# Patient Record
Sex: Female | Born: 1940
Health system: Southern US, Community
[De-identification: ages and names within clinical notes are randomized; demographics above are authoritative.]

## PROBLEM LIST (undated history)

## (undated) DIAGNOSIS — F419 Anxiety disorder, unspecified: Secondary | ICD-10-CM

## (undated) DIAGNOSIS — T7840XA Allergy, unspecified, initial encounter: Secondary | ICD-10-CM

## (undated) DIAGNOSIS — F32A Depression, unspecified: Secondary | ICD-10-CM

## (undated) DIAGNOSIS — M797 Fibromyalgia: Secondary | ICD-10-CM

## (undated) DIAGNOSIS — M179 Osteoarthritis of knee, unspecified: Secondary | ICD-10-CM

## (undated) DIAGNOSIS — M8580 Other specified disorders of bone density and structure, unspecified site: Secondary | ICD-10-CM

## (undated) DIAGNOSIS — J45909 Unspecified asthma, uncomplicated: Secondary | ICD-10-CM

## (undated) DIAGNOSIS — E669 Obesity, unspecified: Secondary | ICD-10-CM

## (undated) DIAGNOSIS — F329 Major depressive disorder, single episode, unspecified: Secondary | ICD-10-CM

## (undated) DIAGNOSIS — M1710 Unilateral primary osteoarthritis, unspecified knee: Secondary | ICD-10-CM

## (undated) HISTORY — PX: TONSILLECTOMY: SUR1361

## (undated) HISTORY — PX: CHOLECYSTECTOMY: SHX55

## (undated) HISTORY — DX: Allergy, unspecified, initial encounter: T78.40XA

## (undated) HISTORY — PX: EYE SURGERY: SHX253

## (undated) HISTORY — DX: Unspecified asthma, uncomplicated: J45.909

## (undated) HISTORY — DX: Osteoarthritis of knee, unspecified: M17.9

## (undated) HISTORY — DX: Other specified disorders of bone density and structure, unspecified site: M85.80

## (undated) HISTORY — PX: PAROTIDECTOMY: SUR1003

## (undated) HISTORY — DX: Unilateral primary osteoarthritis, unspecified knee: M17.10

## (undated) HISTORY — DX: Anxiety disorder, unspecified: F41.9

## (undated) HISTORY — DX: Fibromyalgia: M79.7

## (undated) HISTORY — PX: ABDOMINAL HYSTERECTOMY: SHX81

## (undated) HISTORY — DX: Depression, unspecified: F32.A

## (undated) HISTORY — DX: Major depressive disorder, single episode, unspecified: F32.9

## (undated) HISTORY — DX: Obesity, unspecified: E66.9

## (undated) HISTORY — PX: APPENDECTOMY: SHX54

---

## 2008-03-12 ENCOUNTER — Other Ambulatory Visit: Payer: Self-pay

## 2008-03-12 ENCOUNTER — Ambulatory Visit: Payer: Self-pay | Admitting: Specialist

## 2008-03-21 ENCOUNTER — Inpatient Hospital Stay: Payer: Self-pay | Admitting: Specialist

## 2010-02-10 ENCOUNTER — Ambulatory Visit: Payer: Self-pay | Admitting: Family Medicine

## 2011-01-30 ENCOUNTER — Ambulatory Visit: Payer: Self-pay | Admitting: Family Medicine

## 2011-03-03 ENCOUNTER — Ambulatory Visit: Payer: Self-pay | Admitting: Family Medicine

## 2011-09-16 ENCOUNTER — Ambulatory Visit: Payer: Self-pay

## 2012-04-18 ENCOUNTER — Ambulatory Visit: Payer: Self-pay | Admitting: Unknown Physician Specialty

## 2012-04-20 LAB — PATHOLOGY REPORT

## 2012-08-31 HISTORY — PX: JOINT REPLACEMENT: SHX530

## 2013-03-01 ENCOUNTER — Ambulatory Visit: Payer: Self-pay | Admitting: Family Medicine

## 2013-03-07 ENCOUNTER — Ambulatory Visit: Payer: Self-pay | Admitting: Family Medicine

## 2013-03-08 ENCOUNTER — Encounter: Payer: Self-pay | Admitting: *Deleted

## 2013-03-23 ENCOUNTER — Ambulatory Visit: Payer: Self-pay | Admitting: General Surgery

## 2013-04-18 ENCOUNTER — Ambulatory Visit: Payer: Self-pay | Admitting: Surgery

## 2013-04-18 HISTORY — PX: BREAST CYST ASPIRATION: SHX578

## 2013-09-26 ENCOUNTER — Ambulatory Visit: Payer: Self-pay

## 2014-03-26 ENCOUNTER — Ambulatory Visit: Payer: Self-pay | Admitting: Family Medicine

## 2014-04-18 ENCOUNTER — Emergency Department: Payer: Self-pay | Admitting: Emergency Medicine

## 2015-03-06 DIAGNOSIS — M179 Osteoarthritis of knee, unspecified: Secondary | ICD-10-CM

## 2015-03-06 DIAGNOSIS — E1159 Type 2 diabetes mellitus with other circulatory complications: Secondary | ICD-10-CM | POA: Insufficient documentation

## 2015-03-06 DIAGNOSIS — F419 Anxiety disorder, unspecified: Secondary | ICD-10-CM

## 2015-03-06 DIAGNOSIS — M159 Polyosteoarthritis, unspecified: Secondary | ICD-10-CM | POA: Insufficient documentation

## 2015-03-06 DIAGNOSIS — L304 Erythema intertrigo: Secondary | ICD-10-CM | POA: Insufficient documentation

## 2015-03-06 DIAGNOSIS — E119 Type 2 diabetes mellitus without complications: Secondary | ICD-10-CM | POA: Insufficient documentation

## 2015-03-06 DIAGNOSIS — J302 Other seasonal allergic rhinitis: Secondary | ICD-10-CM | POA: Insufficient documentation

## 2015-03-06 DIAGNOSIS — M1990 Unspecified osteoarthritis, unspecified site: Secondary | ICD-10-CM

## 2015-03-06 DIAGNOSIS — E669 Obesity, unspecified: Secondary | ICD-10-CM | POA: Insufficient documentation

## 2015-03-06 DIAGNOSIS — F329 Major depressive disorder, single episode, unspecified: Secondary | ICD-10-CM

## 2015-03-06 DIAGNOSIS — M8580 Other specified disorders of bone density and structure, unspecified site: Secondary | ICD-10-CM | POA: Insufficient documentation

## 2015-03-06 DIAGNOSIS — M150 Primary generalized (osteo)arthritis: Secondary | ICD-10-CM

## 2015-03-06 DIAGNOSIS — I10 Essential (primary) hypertension: Secondary | ICD-10-CM

## 2015-03-06 DIAGNOSIS — M1710 Unilateral primary osteoarthritis, unspecified knee: Secondary | ICD-10-CM

## 2015-03-06 DIAGNOSIS — F32A Depression, unspecified: Secondary | ICD-10-CM | POA: Insufficient documentation

## 2015-03-06 DIAGNOSIS — I152 Hypertension secondary to endocrine disorders: Secondary | ICD-10-CM | POA: Insufficient documentation

## 2015-03-06 HISTORY — DX: Anxiety disorder, unspecified: F41.9

## 2015-03-11 ENCOUNTER — Encounter: Payer: Self-pay | Admitting: Family Medicine

## 2015-03-11 ENCOUNTER — Ambulatory Visit (INDEPENDENT_AMBULATORY_CARE_PROVIDER_SITE_OTHER): Payer: PPO | Admitting: Family Medicine

## 2015-03-11 VITALS — BP 129/75 | HR 65 | Temp 98.2°F | Ht 59.5 in | Wt 228.0 lb

## 2015-03-11 DIAGNOSIS — Z0000 Encounter for general adult medical examination without abnormal findings: Secondary | ICD-10-CM

## 2015-03-11 DIAGNOSIS — E118 Type 2 diabetes mellitus with unspecified complications: Secondary | ICD-10-CM | POA: Diagnosis not present

## 2015-03-11 DIAGNOSIS — M159 Polyosteoarthritis, unspecified: Secondary | ICD-10-CM

## 2015-03-11 DIAGNOSIS — M150 Primary generalized (osteo)arthritis: Secondary | ICD-10-CM

## 2015-03-11 DIAGNOSIS — I10 Essential (primary) hypertension: Secondary | ICD-10-CM | POA: Diagnosis not present

## 2015-03-11 LAB — URINALYSIS, ROUTINE W REFLEX MICROSCOPIC
BILIRUBIN UA: NEGATIVE
Glucose, UA: NEGATIVE
Ketones, UA: NEGATIVE
Leukocytes, UA: NEGATIVE
NITRITE UA: NEGATIVE
PROTEIN UA: NEGATIVE
RBC, UA: NEGATIVE
SPEC GRAV UA: 1.015 (ref 1.005–1.030)
Urobilinogen, Ur: 0.2 mg/dL (ref 0.2–1.0)
pH, UA: 5.5 (ref 5.0–7.5)

## 2015-03-11 MED ORDER — CARVEDILOL 12.5 MG PO TABS: 12.5000 mg | ORAL_TABLET | Freq: Two times a day (BID) | ORAL | Status: DC

## 2015-03-11 MED ORDER — LOSARTAN POTASSIUM 100 MG PO TABS: 100.0000 mg | ORAL_TABLET | Freq: Every day | ORAL | Status: DC

## 2015-03-11 MED ORDER — TRAMADOL HCL 50 MG PO TABS: 50.0000 mg | ORAL_TABLET | Freq: Every day | ORAL | Status: DC

## 2015-03-11 MED ORDER — OMEPRAZOLE 20 MG PO CPDR: 20.0000 mg | DELAYED_RELEASE_CAPSULE | Freq: Every day | ORAL | Status: DC

## 2015-03-11 MED ORDER — DULOXETINE HCL 60 MG PO CPEP: 60.0000 mg | ORAL_CAPSULE | Freq: Every day | ORAL | Status: DC

## 2015-03-11 NOTE — Assessment & Plan Note (Signed)
Cant take NSAIDS as gets GI bleeding Will give tramadol 1/2 to 1 a day

## 2015-03-11 NOTE — Assessment & Plan Note (Signed)
The current medical regimen is effective;  continue present plan and medications.  

## 2015-03-11 NOTE — Assessment & Plan Note (Signed)
Diet controled 

## 2015-03-11 NOTE — Progress Notes (Signed)
BP 129/75 mmHg  Pulse 65  Temp(Src) 98.2 F (36.8 C)  Ht 4' 11.5" (1.511 m)  Wt 228 lb (103.42 kg)  BMI 45.30 kg/m2  SpO2 99%   Subjective:    Patient ID: Cynthia Jones, female    DOB: [birth year], 74 y.o.   MRN: [id number]  HPI: Cynthia Jones is a 74 y.o. female Pt annual wellness depression screen and other such as fall risk done and normal  Computer issues still not set up (we have tried to get [facility] to set up) Chief Complaint  Patient presents with  . Annual Exam  pts arthritis having a lot of pain in back knees hips shoulders wrists and fingers  Was taking percocet 1/2 tab prn and helped Discussed risk of narcotics and not using. BP stable taking meds with no problems   Relevant past medical, surgical, family and social history reviewed and updated as indicated. Interim medical history since our last visit reviewed. Allergies and medications reviewed and updated.  Review of Systems  Constitutional: Negative.   HENT: Negative.   Eyes: Negative.   Respiratory: Negative.   Cardiovascular: Negative.   Gastrointestinal: Negative.   Endocrine: Negative.   Genitourinary: Negative.   Musculoskeletal: Positive for myalgias, back pain, joint swelling, arthralgias, gait problem, neck pain and neck stiffness.  Skin: Negative.   Allergic/Immunologic: Negative.   Neurological: Negative.   Hematological: Negative.   Psychiatric/Behavioral: Negative.     Per HPI unless specifically indicated above     Objective:    BP 129/75 mmHg  Pulse 65  Temp(Src) 98.2 F (36.8 C)  Ht 4' 11.5" (1.511 m)  Wt 228 lb (103.42 kg)  BMI 45.30 kg/m2  SpO2 99%  Wt Readings from Last 3 Encounters:  03/11/15 228 lb (103.42 kg)  01/29/15 231 lb (104.781 kg)    Physical Exam  Constitutional: She is oriented to person, place, and time. She appears well-developed and well-nourished.  HENT:  Head: Normocephalic and atraumatic.  Right Ear: External ear normal.  Left Ear:  External ear normal.  Nose: Nose normal.  Mouth/Throat: Oropharynx is clear and moist.  Eyes: Conjunctivae and EOM are normal. Pupils are equal, round, and reactive to light.  Neck: Normal range of motion. Neck supple. Carotid bruit is not present.  Cardiovascular: Normal rate, regular rhythm and normal heart sounds.   No murmur heard. Pulmonary/Chest: Effort normal and breath sounds normal. Right breast exhibits no mass and no tenderness. Left breast exhibits no mass and no tenderness. Breasts are symmetrical.  Abdominal: Soft. Bowel sounds are normal. There is no hepatosplenomegaly.  Musculoskeletal: Normal range of motion.  Neurological: She is alert and oriented to person, place, and time.  Skin: No rash noted.  Psychiatric: She has a normal mood and affect. Her behavior is normal. Judgment and thought content normal.        Assessment & Plan:   Problem List Items Addressed This Visit      Cardiovascular and Mediastinum   Hypertension - Primary    The current medical regimen is effective;  continue present plan and medications.         Endocrine   Diabetes mellitus    Diet controled        Musculoskeletal and Integument   Osteoarthritis    Cant take NSAIDS as gets GI bleeding Will give tramadol 1/2 to 1 a day       Other Visit Diagnoses    PE (physical exam), annual  Follow up plan: Return in about 3 months (around 06/11/2015) for 3 mo a1c.

## 2015-03-12 LAB — COMPREHENSIVE METABOLIC PANEL
ALT: 18 IU/L (ref 0–32)
AST: 18 IU/L (ref 0–40)
Albumin/Globulin Ratio: 1.4 (ref 1.1–2.5)
Albumin: 4.2 g/dL (ref 3.5–4.8)
Alkaline Phosphatase: 50 IU/L (ref 39–117)
BUN/Creatinine Ratio: 14 (ref 11–26)
BUN: 11 mg/dL (ref 8–27)
Bilirubin Total: 0.4 mg/dL (ref 0.0–1.2)
CO2: 23 mmol/L (ref 18–29)
Calcium: 9.2 mg/dL (ref 8.7–10.3)
Chloride: 102 mmol/L (ref 97–108)
Creatinine, Ser: 0.78 mg/dL (ref 0.57–1.00)
GFR calc Af Amer: 88 mL/min/{1.73_m2} (ref >59)
GFR calc non Af Amer: 76 mL/min/{1.73_m2} (ref >59)
Globulin, Total: 2.9 g/dL (ref 1.5–4.5)
Glucose: 127 mg/dL — ABNORMAL HIGH (ref 65–99)
Potassium: 4.3 mmol/L (ref 3.5–5.2)
Sodium: 142 mmol/L (ref 134–144)
Total Protein: 7.1 g/dL (ref 6.0–8.5)

## 2015-03-12 LAB — CBC WITH DIFFERENTIAL/PLATELET
Basophils Absolute: 0.1 10*3/uL (ref 0.0–0.2)
Basos: 1 %
EOS (ABSOLUTE): 0.1 10*3/uL (ref 0.0–0.4)
EOS: 1 %
HEMOGLOBIN: 13.9 g/dL (ref 11.1–15.9)
Hematocrit: 40.4 % (ref 34.0–46.6)
IMMATURE GRANULOCYTES: 0 %
Immature Grans (Abs): 0 10*3/uL (ref 0.0–0.1)
LYMPHS: 22 %
Lymphocytes Absolute: 1.8 10*3/uL (ref 0.7–3.1)
MCH: 31.2 pg (ref 26.6–33.0)
MCHC: 34.4 g/dL (ref 31.5–35.7)
MCV: 91 fL (ref 79–97)
Monocytes Absolute: 0.8 10*3/uL (ref 0.1–0.9)
Monocytes: 10 %
Neutrophils Absolute: 5.3 10*3/uL (ref 1.4–7.0)
Neutrophils: 66 %
PLATELETS: 340 10*3/uL (ref 150–379)
RBC: 4.46 x10E6/uL (ref 3.77–5.28)
RDW: 13.3 % (ref 12.3–15.4)
WBC: 8 10*3/uL (ref 3.4–10.8)

## 2015-03-12 LAB — LIPID PANEL
Chol/HDL Ratio: 3.1 ratio units (ref 0.0–4.4)
Cholesterol, Total: 213 mg/dL — ABNORMAL HIGH (ref 100–199)
HDL: 69 mg/dL (ref >39)
LDL CALC: 121 mg/dL — AB (ref 0–99)
TRIGLYCERIDES: 117 mg/dL (ref 0–149)
VLDL CHOLESTEROL CAL: 23 mg/dL (ref 5–40)

## 2015-03-12 LAB — HEMOGLOBIN A1C
ESTIMATED AVERAGE GLUCOSE: 143 mg/dL
HEMOGLOBIN A1C: 6.6 % — AB (ref 4.8–5.6)

## 2015-03-12 LAB — TSH: TSH: 4.23 u[IU]/mL (ref 0.450–4.500)

## 2015-03-12 MED ORDER — CARVEDILOL 12.5 MG PO TABS: 12.5000 mg | ORAL_TABLET | Freq: Two times a day (BID) | ORAL | Status: DC

## 2015-03-12 NOTE — Addendum Note (Signed)
Addended byVonita Moss on: 03/12/2015 05:18 PM   Modules accepted: Orders, SmartSet

## 2015-03-18 ENCOUNTER — Telehealth: Payer: Self-pay | Admitting: Family Medicine

## 2015-03-18 NOTE — Telephone Encounter (Signed)
Pt called stated that the medication that was changed last Monday and it's just not working. Pt stated she thinks dosage may be to low. Please call pt ASAP. Thanks.

## 2015-03-18 NOTE — Telephone Encounter (Signed)
Phone call Discussed with patient tramadol not helping pain patient wants something more. Patient is [facility] orthopedics and rheumatology and mostly has multiple and diffuse degenerative joint disease with chronic pain. Concerned about using stronger medication and risk. Offered to refer patient back to specialists and she refuses offered to refer patient to pain clinic and she refuses. Discussed use of topicals which she refuses. Discussed use of over-the-counter medicines hot and cold packs.

## 2015-03-19 ENCOUNTER — Telehealth: Payer: Self-pay

## 2015-03-19 DIAGNOSIS — M150 Primary generalized (osteo)arthritis: Principal | ICD-10-CM

## 2015-03-19 DIAGNOSIS — M159 Polyosteoarthritis, unspecified: Secondary | ICD-10-CM

## 2015-03-19 NOTE — Telephone Encounter (Signed)
Patient states that she would like to proceed with your recommendation to a pain clinic. Please advise where patient needs to go and next steps.

## 2015-03-21 ENCOUNTER — Telehealth: Payer: Self-pay | Admitting: Family Medicine

## 2015-03-21 ENCOUNTER — Other Ambulatory Visit: Payer: Self-pay | Admitting: Family Medicine

## 2015-03-21 DIAGNOSIS — M159 Polyosteoarthritis, unspecified: Secondary | ICD-10-CM

## 2015-03-21 DIAGNOSIS — M150 Primary generalized (osteo)arthritis: Principal | ICD-10-CM

## 2015-03-21 MED ORDER — TRAMADOL HCL 50 MG PO TABS: 50.0000 mg | ORAL_TABLET | Freq: Two times a day (BID) | ORAL | Status: DC

## 2015-03-21 NOTE — Telephone Encounter (Signed)
Pt called stated she needs a call back from [facility]. Please call her ASAP. Thanks.

## 2015-03-21 NOTE — Telephone Encounter (Signed)
Patient for pain management will change tramadol to 50 mg twice a day and referred to pain clinic patient now willing to go to local pain management clinic.

## 2015-04-02 ENCOUNTER — Telehealth: Payer: Self-pay

## 2015-04-02 NOTE — Telephone Encounter (Signed)
Patient wanted [facility] to know she had bright red blood in her stool, so she has stopped taking the Tramadol

## 2015-04-02 NOTE — Telephone Encounter (Signed)
Pt called would like a call back from [place]. Pt stated she would be home all day so anytime you call will be fine. Thanks.

## 2015-04-06 NOTE — Telephone Encounter (Signed)
call

## 2015-04-08 NOTE — Telephone Encounter (Signed)
Phone call Discussed with patient Had bright red blood on further discussion like hemorrhoids will observe

## 2015-04-09 ENCOUNTER — Encounter: Payer: Self-pay | Admitting: Pain Medicine

## 2015-04-09 ENCOUNTER — Ambulatory Visit: Payer: PPO | Attending: Pain Medicine | Admitting: Pain Medicine

## 2015-04-09 VITALS — BP 127/54 | HR 84 | Temp 97.9°F | Resp 18 | Ht 60.0 in | Wt 220.0 lb

## 2015-04-09 DIAGNOSIS — E119 Type 2 diabetes mellitus without complications: Secondary | ICD-10-CM | POA: Insufficient documentation

## 2015-04-09 DIAGNOSIS — I10 Essential (primary) hypertension: Secondary | ICD-10-CM | POA: Diagnosis not present

## 2015-04-09 DIAGNOSIS — M533 Sacrococcygeal disorders, not elsewhere classified: Secondary | ICD-10-CM | POA: Insufficient documentation

## 2015-04-09 DIAGNOSIS — M5136 Other intervertebral disc degeneration, lumbar region: Secondary | ICD-10-CM | POA: Diagnosis not present

## 2015-04-09 DIAGNOSIS — M542 Cervicalgia: Secondary | ICD-10-CM | POA: Diagnosis present

## 2015-04-09 DIAGNOSIS — M7060 Trochanteric bursitis, unspecified hip: Secondary | ICD-10-CM | POA: Insufficient documentation

## 2015-04-09 DIAGNOSIS — M069 Rheumatoid arthritis, unspecified: Secondary | ICD-10-CM | POA: Insufficient documentation

## 2015-04-09 DIAGNOSIS — M546 Pain in thoracic spine: Secondary | ICD-10-CM | POA: Diagnosis present

## 2015-04-09 DIAGNOSIS — M47816 Spondylosis without myelopathy or radiculopathy, lumbar region: Secondary | ICD-10-CM | POA: Insufficient documentation

## 2015-04-09 DIAGNOSIS — M797 Fibromyalgia: Secondary | ICD-10-CM | POA: Insufficient documentation

## 2015-04-09 DIAGNOSIS — Z96651 Presence of right artificial knee joint: Secondary | ICD-10-CM | POA: Diagnosis not present

## 2015-04-09 DIAGNOSIS — E1340 Other specified diabetes mellitus with diabetic neuropathy, unspecified: Secondary | ICD-10-CM | POA: Insufficient documentation

## 2015-04-09 DIAGNOSIS — M1990 Unspecified osteoarthritis, unspecified site: Secondary | ICD-10-CM | POA: Diagnosis not present

## 2015-04-09 DIAGNOSIS — K219 Gastro-esophageal reflux disease without esophagitis: Secondary | ICD-10-CM | POA: Insufficient documentation

## 2015-04-09 DIAGNOSIS — M5481 Occipital neuralgia: Secondary | ICD-10-CM | POA: Insufficient documentation

## 2015-04-09 DIAGNOSIS — M170 Bilateral primary osteoarthritis of knee: Secondary | ICD-10-CM

## 2015-04-09 DIAGNOSIS — M1712 Unilateral primary osteoarthritis, left knee: Secondary | ICD-10-CM | POA: Insufficient documentation

## 2015-04-09 NOTE — Progress Notes (Signed)
Subjective:    Patient ID: Cynthia Jones, female    DOB: 1941/06/25, 74 y.o.   MRN: [id number]  HPI   Patient is 74 year old female who comes [facility] request of Dr. Vonita Moss for further evaluation and treatment of pain involving the neck and entire back upper and lower extremity regions. On today's visit patient stated that she has significant pain involving the knees with prior total knee replacement on the right with severe pain of the left knee at this time. Patient also admits to significant lower back pain as well as pain occurring in the cervical thoracic region patient described her pain as aching agonizing pain that increased with walking. Patient stated that the pain interfered with all activities of daily living including ability to obtain restful sleep. Patient stated the pain had decreased with resting and medications. We discussed the patient's condition and will proceed with geniculate nerve blocks of the knee at time of return appointment. The patient was with understanding and in agreement with suggested treatment plan     Review of Systems   Cardiovascular High blood pressure  Pulmonary Unremarkable  Neurological Unremarkable  Psychological Unremarkable  Gastrointestinal Gastroesophageal reflux disease  Genitourinary Unremarkable  Hematological Unremarkable  Endocrine Diabetes mellitus (patient states that she is diet controlled)  Rheumatological Osteoarthritis Rheumatoid arthritis Fibromyalgia  Musculoskeletal Unremarkable  Other significant Weight loss           Objective:   Physical Exam  There was tends to palpation of the splenius capitis and occipitalis musculature region a moderate degree. Palpation of the trapezius muscle was with moderate tends to palpation as well. There appeared to be unremarkable Spurling's maneuver and range of motion maneuvers of the cervical spine will for were without exacerbation of  severe pain. There was tenderness of the acromioclavicular and glenohumeral joint regions of moderate degree. Tinel and Phalen's maneuver was associated with moderate discomfort. Patient appeared to be with decreased grip strength. There was tenderness over the thoracic facet as well as cervical facet and cervical and thoracic paraspinal musculature regions of moderate degree. Palpation over the lumbar paraspinal musculature region lumbar facet region was with moderate to moderately severe tenderness to palpation on the left compared to the right with severe tends to palpation of the gluteal and piriformis musculature region on the left compared to the right. There was severe tends to palpation of the PSIS and PII S regions as well with left greater than right median more severe. There was tenderness along the greater trochanteric region and iliotibial band region. Straight leg raising was tolerated to approximately 30 without increased pain with dorsiflexion noted. EHL strength appeared to be decreased. There was well-healed surgical scar of the knee without increased warmth or erythema in the region of the knee. There was crepitus of the left knee with negative anterior and posterior drawer signs without ballottement of the patella and without increased warmth or erythema noted. EHL strength appeared to be decreased. There was negative clonus and negative Homans. There was moderate tenderness to palpation of the greater trochanteric region and iliotibial band region. The abdomen was nontender and no costovertebral angle tenderness was noted.      Assessment & Plan:   Degenerative disc disease lumbar spine  Lumbar facet syndrome  Fibromyalgia  Osteoarthritis  Rheumatoid arthritis    Plan  Continue present medications at this time  Geniculate nerve blocks of the knee to be performed at time of return appointment  F/U PCP [name]  for evaliation of  BP, diabetes mellitus, and general  medical  condition  F/U surgical evaluation to be considered as discussed  F/U neurological evaluation with PNCV/EMG studies to be considered  Rheumatological evaluation pending response to treatment and pending discussion with [name]  May consider radiofrequency rhizolysis or intraspinal procedures pending response to present treatment and F/U evaluation   Patient to call [facility] should patient have concerns prior to scheduled return appointmen.

## 2015-04-09 NOTE — Progress Notes (Signed)
Safety precautions to be maintained throughout the outpatient stay will include: orient to surroundings, keep bed in low position, maintain call bell within reach at all times, provide assistance with transfer out of bed and ambulation.  

## 2015-04-09 NOTE — Patient Instructions (Addendum)
Continue present medication. We will ask [name] permission to prescribe medication for treatment of your pain  Geniculate nerve blocks of knee to be performed at time of return appointment  F/U PCP [name] for evaliation of  BP and general medical  condition  F/U surgical evaluation  F/U neurological evaluation  May consider radiofrequency rhizolysis or intraspinal procedures pending response to present treatment and F/U evaluation   Patient to call [facility] should patient have concerns prior to scheduled return appointmen. GENERAL RISKS AND COMPLICATIONS  What are the risk, side effects and possible complications? Generally speaking, most procedures are safe.  However, with any procedure there are risks, side effects, and the possibility of complications.  The risks and complications are dependent upon the sites that are lesioned, or the type of nerve block to be performed.  The closer the procedure is to the spine, the more serious the risks are.  Great care is taken when placing the radio frequency needles, block needles or lesioning probes, but sometimes complications can occur. 1. Infection: Any time there is an injection through the skin, there is a risk of infection.  This is why sterile conditions are used for these blocks.  There are four possible types of infection. 1. Localized skin infection. 2. Central Nervous System Infection-This can be in the form of Meningitis, which can be deadly. 3. Epidural Infections-This can be in the form of an epidural abscess, which can cause pressure inside of the spine, causing compression of the spinal cord with subsequent paralysis. This would require an emergency surgery to decompress, and there are no guarantees that the patient would recover from the paralysis. 4. Discitis-This is an infection of the intervertebral discs.  It occurs in about 1% of discography procedures.  It is difficult to treat and it may lead to  surgery.        2. Pain: the needles have to go through skin and soft tissues, will cause soreness.       3. Damage to internal structures:  The nerves to be lesioned may be near blood vessels or    other nerves which can be potentially damaged.       4. Bleeding: Bleeding is more common if the patient is taking blood thinners such as  aspirin, Coumadin, Ticiid, Plavix, etc., or if he/she have some genetic predisposition  such as hemophilia. Bleeding into the spinal canal can cause compression of the spinal  cord with subsequent paralysis.  This would require an emergency surgery to  decompress and there are no guarantees that the patient would recover from the  paralysis.       5. Pneumothorax:  Puncturing of a lung is a possibility, every time a needle is introduced in  the area of the chest or upper back.  Pneumothorax refers to free air around the  collapsed lung(s), inside of the thoracic cavity (chest cavity).  Another two possible  complications related to a similar event would include: Hemothorax and Chylothorax.   These are variations of the Pneumothorax, where instead of air around the collapsed  lung(s), you may have blood or chyle, respectively.       6. Spinal headaches: They may occur with any procedures in the area of the spine.       7. Persistent CSF (Cerebro-Spinal Fluid) leakage: This is a rare problem, but may occur  with prolonged intrathecal or epidural catheters either due to the formation of a fistulous  track or a  dural tear.       8. Nerve damage: By working so close to the spinal cord, there is always a possibility of  nerve damage, which could be as serious as a permanent spinal cord injury with  paralysis.       9. Death:  Although rare, severe deadly allergic reactions known as "Anaphylactic  reaction" can occur to any of the medications used.      10. Worsening of the symptoms:  We can always make thing worse.  What are the chances of something like this  happening? Chances of any of this occuring are extremely low.  By statistics, you have more of a chance of getting killed in a motor vehicle accident: while driving to the hospital than any of the above occurring .  Nevertheless, you should be aware that they are possibilities.  In general, it is similar to taking a shower.  Everybody knows that you can slip, hit your head and get killed.  Does that mean that you should not shower again?  Nevertheless always keep in mind that statistics do not mean anything if you happen to be on the wrong side of them.  Even if a procedure has a 1 (one) in a 1,000,000 (million) chance of going wrong, it you happen to be that one..Also, keep in mind that by statistics, you have more of a chance of having something go wrong when taking medications.  Who should not have this procedure? If you are on a blood thinning medication (e.g. Coumadin, Plavix, see list of "Blood Thinners"), or if you have an active infection going on, you should not have the procedure.  If you are taking any blood thinners, please inform your physician.  How should I prepare for this procedure?  Do not eat or drink anything at least six hours prior to the procedure.  Bring a driver with you .  It cannot be a taxi.  Come accompanied by an adult that can drive you back, and that is strong enough to help you if your legs get weak or numb from the local anesthetic.  Take all of your medicines the morning of the procedure with just enough water to swallow them.  If you have diabetes, make sure that you are scheduled to have your procedure done first thing in the morning, whenever possible.  If you have diabetes, take only half of your insulin dose and notify our nurse that you have done so as soon as you arrive at the clinic.  If you are diabetic, but only take blood sugar pills (oral hypoglycemic), then do not take them on the morning of your procedure.  You may take them after you have had the  procedure.  Do not take aspirin or any aspirin-containing medications, at least eleven (11) days prior to the procedure.  They may prolong bleeding.  Wear loose fitting clothing that may be easy to take off and that you would not mind if it got stained with Betadine or blood.  Do not wear any jewelry or perfume  Remove any nail coloring.  It will interfere with some of our monitoring equipment.  NOTE: Remember that this is not meant to be interpreted as a complete list of all possible complications.  Unforeseen problems may occur.  BLOOD THINNERS The following drugs contain aspirin or other products, which can cause increased bleeding during surgery and should not be taken for 2 weeks prior to and 1 week after surgery.  If you  should need take something for relief of minor pain, you may take acetaminophen which is found in Tylenol,m Datril, Anacin-3 and Panadol. It is not blood thinner. The products listed below are.  Do not take any of the products listed below in addition to any listed on your instruction sheet.  A.P.C or A.P.C with Codeine Codeine Phosphate Capsules #3 Ibuprofen Ridaura  ABC compound Congesprin Imuran rimadil  Advil Cope Indocin Robaxisal  Alka-Seltzer Effervescent Pain Reliever and Antacid Coricidin or Coricidin-D  Indomethacin Rufen  Alka-Seltzer plus Cold Medicine Cosprin Ketoprofen S-A-C Tablets  Anacin Analgesic Tablets or Capsules Coumadin Korlgesic Salflex  Anacin Extra Strength Analgesic tablets or capsules CP-2 Tablets Lanoril Salicylate  Anaprox Cuprimine Capsules Levenox Salocol  Anexsia-D Dalteparin Magan Salsalate  Anodynos Darvon compound Magnesium Salicylate Sine-off  Ansaid Dasin Capsules Magsal Sodium Salicylate  Anturane Depen Capsules Marnal Soma  APF Arthritis pain formula Dewitt's Pills Measurin Stanback  Argesic Dia-Gesic Meclofenamic Sulfinpyrazone  Arthritis Bayer Timed Release Aspirin Diclofenac Meclomen Sulindac  Arthritis pain formula  Anacin Dicumarol Medipren Supac  Analgesic (Safety coated) Arthralgen Diffunasal Mefanamic Suprofen  Arthritis Strength Bufferin Dihydrocodeine Mepro Compound Suprol  Arthropan liquid Dopirydamole Methcarbomol with Aspirin Synalgos  ASA tablets/Enseals Disalcid Micrainin Tagament  Ascriptin Doan's Midol Talwin  Ascriptin A/D Dolene Mobidin Tanderil  Ascriptin Extra Strength Dolobid Moblgesic Ticlid  Ascriptin with Codeine Doloprin or Doloprin with Codeine Momentum Tolectin  Asperbuf Duoprin Mono-gesic Trendar  Aspergum Duradyne Motrin or Motrin IB Triminicin  Aspirin plain, buffered or enteric coated Durasal Myochrisine Trigesic  Aspirin Suppositories Easprin Nalfon Trillsate  Aspirin with Codeine Ecotrin Regular or Extra Strength Naprosyn Uracel  Atromid-S Efficin Naproxen Ursinus  Auranofin Capsules Elmiron Neocylate Vanquish  Axotal Emagrin Norgesic Verin  Azathioprine Empirin or Empirin with Codeine Normiflo Vitamin E  Azolid Emprazil Nuprin Voltaren  Bayer Aspirin plain, buffered or children's or timed BC Tablets or powders Encaprin Orgaran Warfarin Sodium  Buff-a-Comp Enoxaparin Orudis Zorpin  Buff-a-Comp with Codeine Equegesic Os-Cal-Gesic   Buffaprin Excedrin plain, buffered or Extra Strength Oxalid   Bufferin Arthritis Strength Feldene Oxphenbutazone   Bufferin plain or Extra Strength Feldene Capsules Oxycodone with Aspirin   Bufferin with Codeine Fenoprofen Fenoprofen Pabalate or Pabalate-SF   Buffets II Flogesic Panagesic   Buffinol plain or Extra Strength Florinal or Florinal with Codeine Panwarfarin   Buf-Tabs Flurbiprofen Penicillamine   Butalbital Compound Four-way cold tablets Penicillin   Butazolidin Fragmin Pepto-Bismol   Carbenicillin Geminisyn Percodan   Carna Arthritis Reliever Geopen Persantine   Carprofen Gold's salt Persistin   Chloramphenicol Goody's Phenylbutazone   Chloromycetin Haltrain Piroxlcam   Clmetidine heparin Plaquenil   Cllnoril Hyco-pap  Ponstel   Clofibrate Hydroxy chloroquine Propoxyphen         Before stopping any of these medications, be sure to consult the physician who ordered them.  Some, such as Coumadin (Warfarin) are ordered to prevent or treat serious conditions such as "deep thrombosis", "pumonary embolisms", and other heart problems.  The amount of time that you may need off of the medication may also vary with the medication and the reason for which you were taking it.  If you are taking any of these medications, please make sure you notify your pain physician before you undergo any procedures.         Knee Injection Joint injections are shots. Your caregiver will place a needle into your knee joint. The needle is used to put medicine into the joint. These shots can be used to help treat different painful  knee conditions such as osteoarthritis, bursitis, local flare-ups of rheumatoid arthritis, and pseudogout. Anti-inflammatory medicines such as corticosteroids and anesthetics are the most common medicines used for joint and soft tissue injections.  PROCEDURE 2. The skin over the kneecap will be cleaned with an antiseptic solution. 3. Your caregiver will inject a small amount of a local anesthetic (a medicine like Novocaine) just under the skin in the area that was cleaned. 4. After the area becomes numb, a second injection is done. This second injection usually includes an anesthetic and an anti-inflammatory medicine called a steroid or cortisone. The needle is carefully placed in between the kneecap and the knee, and the medicine is injected into the joint space. 5. After the injection is done, the needle is removed. Your caregiver may place a bandage over the injection site. The whole procedure takes no more than a couple of minutes. BEFORE THE PROCEDURE  Wash all of the skin around the entire knee area. Try to remove any loose, scaling skin. There is no other specific preparation necessary unless advised  otherwise by your caregiver. LET YOUR CAREGIVER KNOW ABOUT:   Allergies.  Medications taken including herbs, eye drops, over the counter medications, and creams.  Use of steroids (by mouth or creams).  Possible pregnancy, if applicable.  Previous problems with anesthetics or Novocaine.  History of blood clots (thrombophlebitis).  History of bleeding or blood problems.  Previous surgery.  Other health problems. RISKS AND COMPLICATIONS Side effects from cortisone shots are rare. They include:   Slight bruising of the skin.  Shrinkage of the normal fatty tissue under the skin where the shot was given.  Increase in pain after the shot.  Infection.  Weakening of tendons or tendon rupture.  Allergic reaction to the medicine.  Diabetics may have a temporary increase in their blood sugar after a shot.  Cortisone can temporarily weaken the immune system. While receiving these shots, you should not get certain vaccines. Also, avoid contact with anyone who has chickenpox or measles. Especially if you have never had these diseases or have not been previously immunized. Your immune system may not be strong enough to fight off the infection while the cortisone is in your system. AFTER THE PROCEDURE   You can go home after the procedure.  You may need to put ice on the joint 15-20 minutes every 3 or 4 hours until the pain goes away.  You may need to put an elastic bandage on the joint. HOME CARE INSTRUCTIONS  12. Only take over-the-counter or prescription medicines for pain, discomfort, or fever as directed by your caregiver. 13. You should avoid stressing the joint. Unless advised otherwise, avoid activities that put a lot of pressure on a knee joint, such as: 1. Jogging. 2. Bicycling. 3. Recreational climbing. 4. Hiking. 14. Laying down and elevating the leg/knee above the level of your heart can help to minimize swelling. SEEK MEDICAL CARE IF:   You have repeated or  worsening swelling.  There is drainage from the puncture area.  You develop red streaking that extends above or below the site where the needle was inserted. SEEK IMMEDIATE MEDICAL CARE IF:   You develop a fever.  You have pain that gets worse even though you are taking pain medicine.  The area is red and warm, and you have trouble moving the joint. MAKE SURE YOU:   Understand these instructions.  Will watch your condition.  Will get help right away if you are not doing well  or get worse. Document Released: 11/08/2006 Document Revised: 11/09/2011 Document Reviewed: 08/05/2007 [facility] Patient Information 2015 [place], [place]. This information is not intended to replace advice given to you by your health care provider. Make sure you discuss any questions you have with your health care provider.

## 2015-04-10 ENCOUNTER — Telehealth: Payer: Self-pay | Admitting: Family Medicine

## 2015-04-10 MED ORDER — VALACYCLOVIR HCL 1 G PO TABS: 1000.0000 mg | ORAL_TABLET | Freq: Three times a day (TID) | ORAL | Status: DC

## 2015-04-10 NOTE — Telephone Encounter (Signed)
Cynthia Jones thinks she has shingles on the back of her neck, red, itchy etc.  She is due for nerve block on Weds.

## 2015-04-10 NOTE — Telephone Encounter (Signed)
Phone call Discussed with patient patient has unilateral rash on her neck bubbling up similar to shingles she is seen in other people will start Valtrex

## 2015-04-15 ENCOUNTER — Other Ambulatory Visit: Payer: Self-pay | Admitting: Pain Medicine

## 2015-04-16 ENCOUNTER — Telehealth: Payer: Self-pay | Admitting: Pain Medicine

## 2015-04-16 NOTE — Telephone Encounter (Signed)
Is on meds for shingles and it is working well. Wants injection for the knee. Will be here in the a.m.

## 2015-04-16 NOTE — Telephone Encounter (Signed)
Dena and Nurses Thank you for the follow-up

## 2015-04-16 NOTE — Telephone Encounter (Signed)
Dr. Metta Clines. Please advise on what to tell patient.

## 2015-04-16 NOTE — Telephone Encounter (Signed)
Nurses and Lynden Ang as well as Morrie Sheldon Please inform me of the location of the shingles this morning I will plan to treat the shingles pain or treat the pain of the knee depending upon which pain patient prefers to have me treat Morrie Sheldon and Angie Of course we need to see if insurance will allow me to treat the shingles pain which needs to be treated at this time to prevent the development of postherpetic neuralgia  Please see if patient can be approved to treat the shingles pain. The nurses will need to tell us where the patient shingles pain is located so that I can tell you which procedure would like to see if insurance will approve to treat the shingles

## 2015-04-16 NOTE — Telephone Encounter (Signed)
Having nerve block Wednesday 04-17-15 has full blown shingles about a week now / should she still have procedure ? Please call soon as possible

## 2015-04-17 ENCOUNTER — Other Ambulatory Visit: Payer: Self-pay | Admitting: Family Medicine

## 2015-04-17 ENCOUNTER — Ambulatory Visit: Payer: PPO | Attending: Pain Medicine | Admitting: Pain Medicine

## 2015-04-17 VITALS — BP 96/79 | HR 73 | Temp 98.5°F | Resp 16 | Ht 60.0 in | Wt 220.0 lb

## 2015-04-17 DIAGNOSIS — M1712 Unilateral primary osteoarthritis, left knee: Secondary | ICD-10-CM | POA: Diagnosis not present

## 2015-04-17 DIAGNOSIS — M533 Sacrococcygeal disorders, not elsewhere classified: Secondary | ICD-10-CM

## 2015-04-17 DIAGNOSIS — M47816 Spondylosis without myelopathy or radiculopathy, lumbar region: Secondary | ICD-10-CM

## 2015-04-17 DIAGNOSIS — Z96651 Presence of right artificial knee joint: Secondary | ICD-10-CM

## 2015-04-17 DIAGNOSIS — M7060 Trochanteric bursitis, unspecified hip: Secondary | ICD-10-CM

## 2015-04-17 DIAGNOSIS — M79605 Pain in left leg: Secondary | ICD-10-CM | POA: Diagnosis present

## 2015-04-17 DIAGNOSIS — M797 Fibromyalgia: Secondary | ICD-10-CM

## 2015-04-17 DIAGNOSIS — E1340 Other specified diabetes mellitus with diabetic neuropathy, unspecified: Secondary | ICD-10-CM

## 2015-04-17 DIAGNOSIS — M545 Low back pain: Secondary | ICD-10-CM | POA: Insufficient documentation

## 2015-04-17 DIAGNOSIS — M79604 Pain in right leg: Secondary | ICD-10-CM | POA: Diagnosis present

## 2015-04-17 DIAGNOSIS — M170 Bilateral primary osteoarthritis of knee: Secondary | ICD-10-CM

## 2015-04-17 MED ORDER — TRIAMCINOLONE ACETONIDE 40 MG/ML IJ SUSP
INTRAMUSCULAR | Status: AC
  Administered 2015-04-17: 13:00:00
  Filled 2015-04-17: qty 1

## 2015-04-17 MED ORDER — CIPROFLOXACIN IN D5W 400 MG/200ML IV SOLN
INTRAVENOUS | Status: AC
  Administered 2015-04-17: 400 mg via INTRAVENOUS
  Filled 2015-04-17: qty 200

## 2015-04-17 MED ORDER — FENTANYL CITRATE (PF) 100 MCG/2ML IJ SOLN
INTRAMUSCULAR | Status: AC
  Administered 2015-04-17: 100 ug via INTRAVENOUS
  Filled 2015-04-17: qty 2

## 2015-04-17 MED ORDER — CIPROFLOXACIN HCL 250 MG PO TABS: 250.0000 mg | ORAL_TABLET | Freq: Two times a day (BID) | ORAL | Status: DC

## 2015-04-17 MED ORDER — BUPIVACAINE HCL (PF) 0.25 % IJ SOLN
INTRAMUSCULAR | Status: AC
  Administered 2015-04-17: 13:00:00
  Filled 2015-04-17: qty 30

## 2015-04-17 MED ORDER — MIDAZOLAM HCL 5 MG/5ML IJ SOLN
INTRAMUSCULAR | Status: AC
  Administered 2015-04-17: 3 mg via INTRAVENOUS
  Filled 2015-04-17: qty 5

## 2015-04-17 NOTE — Progress Notes (Signed)
Subjective:    Patient ID: Cynthia Jones, female    DOB: 13-Jun-1941, 74 y.o.   MRN: [id number]  HPI  Geniculate nerve blocks of the  left knee   The patient is a 74 y.o. female who [facility] for further evaluation and treatment of pain involving the lumbar lower extremity region with severe pain of the  left knee. Prior studies reveal patient to be with  severe degenerative joint disease of the  left knee.  We will proceed with geniculate nerve blocks of the  left knee in an attempt to decrease severity of symptoms, minimize the risk of medication escalation, hopefully retard progression of symptoms and avoid the need for more involved treatment.  The risks benefits and expectations of the procedure were discussed with the patient. The patient was with understanding and in agreement with suggested treatment plan.  DESCRIPTION OF PROCEDURE: Geniculate nerve blocks of the  left knee. The  procedure was performed with IV Versed and IV fentanyl, conscious sedation and under fluoroscopic guidance.  NEEDLE PLACEMENT FOR BLOCK OF THE LATERAL SUPERIOR GENICULATE NERVE: The patient was taking to the fluoroscopy suite. With the patient supine, with knee in flexed position, Betadine prep of proposed entry site accomplished.  IV Versed, IV fentanyl conscious sedation, EKG, blood pressure, pulse and pulse oximetry monitoring were all in place. Under fluoroscopic guidance, a 22-gauge needle was inserted in the region of the  left knee with needle placed at the lateral border of the femur at the junction of the shaft of the femur and the condyle of the femur.  Following needle placement at the lateral aspect of the knee, needle placement was then accomplished in the region of the medial aspect of the knee.  NEEDLE PLACEMENT FOR BLOCK OF THE MEDIAL SUPERIOR GENICULATE NERVE:  Under fluoroscopic guidance, a 22 - gauge needle was inserted in the region of the  left knee with needle  placed at the medial border of the femur at the junction of the shaft of the femur and the condyle of the femur.   NEEDLE PLACEMENT FOR BLOCK OF THE MEDIAL INFERIOR GENICULATE NERVE:  Under fluoroscopic guidance, a 22 - gauge needle was inserted in the region of the  left knee with needle placed at the junction of the shaft and plateau of the tibia.   Following needle placement on AP view of needles placed in all three locations, placement was then verified on lateral view with the tips of the superior lateral and superior medial needles documented to be one half the distance of the shaft of the femur and the tip of the inferior medial geniculate needle documented to be one half the distance of the shaft of the tibia.  Following documentation of needle placements on lateral view, each needle was injected with one mL of 0.25% bupivacaine with Kenalog. A total of 10 mg of Kenalog was utilized for the entire procedure. The patient tolerated the procedure well.    PLAN 1. Medications: Continue present medications 2. Follow-up appointment with PCP Dr. Dahlia Bailiff evaluation of blood pressure and general medical condition. 3. Follow-up surgical evaluation as discussed 4. Follow-up neurological evaluation 5. He patient may be a candidate for radiofrequency rhizolysis and other treatment pending response to treatment on today's visit and follow-up evaluation. 6. The patient is advised to adhere to proper body mechanics and avoid activities which appear to aggravate condition      Review of Systems  Objective:   Physical Exam        Assessment & Plan:

## 2015-04-17 NOTE — Patient Instructions (Addendum)
Continue present medications and begin taking antibiotic Cipro as prescribed. Please obtain your antibiotic Cipro today and begin taking antibiotic today  F/U PCP [name] for evaliation of  BP and general medical  condition.  F/U surgical evaluation as discussed  F/U neurological evaluation.  May consider radiofrequency rhizolysis or intraspinal procedures pending response to present treatment and F/U evaluation.  Patient to call [facility] should patient have concerns prior to scheduled return appointment.  Pain Management Discharge Instructions  General Discharge Instructions :  If you need to reach your doctor call: Monday-Friday 8:00 am - 4:00 pm at [phone] or toll free [phone].  After clinic hours [phone] to have operator reach doctor.  Bring all of your medication bottles to all your appointments in the pain clinic.  To cancel or reschedule your appointment with Pain Management please remember to call 24 hours in advance to avoid a fee.  Refer to the educational materials which you have been given on: General Risks, I had my Procedure. Discharge Instructions, Post Sedation.  Post Procedure Instructions:  The drugs you were given will stay in your system until tomorrow, so for the next 24 hours you should not drive, make any legal decisions or drink any alcoholic beverages.  You may eat anything you prefer, but it is better to start with liquids then soups and crackers, and gradually work up to solid foods.  Please notify your doctor immediately if you have any unusual bleeding, trouble breathing or pain that is not related to your normal pain.  Depending on the type of procedure that was done, some parts of your body may feel week and/or numb.  This usually clears up by tonight or the next day.  Walk with the use of an assistive device or accompanied by an adult for the 24 hours.  You may use ice on the affected area for the first 24 hours.   Put ice in a Ziploc bag and cover with a towel and place against area 15 minutes on 15 minutes off.  You may switch to heat after 24 hours.GENERAL RISKS AND COMPLICATIONS  What are the risk, side effects and possible complications? Generally speaking, most procedures are safe.  However, with any procedure there are risks, side effects, and the possibility of complications.  The risks and complications are dependent upon the sites that are lesioned, or the type of nerve block to be performed.  The closer the procedure is to the spine, the more serious the risks are.  Great care is taken when placing the radio frequency needles, block needles or lesioning probes, but sometimes complications can occur. 1. Infection: Any time there is an injection through the skin, there is a risk of infection.  This is why sterile conditions are used for these blocks.  There are four possible types of infection. 1. Localized skin infection. 2. Central Nervous System Infection-This can be in the form of Meningitis, which can be deadly. 3. Epidural Infections-This can be in the form of an epidural abscess, which can cause pressure inside of the spine, causing compression of the spinal cord with subsequent paralysis. This would require an emergency surgery to decompress, and there are no guarantees that the patient would recover from the paralysis. 4. Discitis-This is an infection of the intervertebral discs.  It occurs in about 1% of discography procedures.  It is difficult to treat and it may lead to surgery.        2. Pain: the needles have to  go through skin and soft tissues, will cause soreness.       3. Damage to internal structures:  The nerves to be lesioned may be near blood vessels or    other nerves which can be potentially damaged.       4. Bleeding: Bleeding is more common if the patient is taking blood thinners such as  aspirin, Coumadin, Ticiid, Plavix, etc., or if he/she have some genetic predisposition  such  as hemophilia. Bleeding into the spinal canal can cause compression of the spinal  cord with subsequent paralysis.  This would require an emergency surgery to  decompress and there are no guarantees that the patient would recover from the  paralysis.       5. Pneumothorax:  Puncturing of a lung is a possibility, every time a needle is introduced in  the area of the chest or upper back.  Pneumothorax refers to free air around the  collapsed lung(s), inside of the thoracic cavity (chest cavity).  Another two possible  complications related to a similar event would include: Hemothorax and Chylothorax.   These are variations of the Pneumothorax, where instead of air around the collapsed  lung(s), you may have blood or chyle, respectively.       6. Spinal headaches: They may occur with any procedures in the area of the spine.       7. Persistent CSF (Cerebro-Spinal Fluid) leakage: This is a rare problem, but may occur  with prolonged intrathecal or epidural catheters either due to the formation of a fistulous  track or a dural tear.       8. Nerve damage: By working so close to the spinal cord, there is always a possibility of  nerve damage, which could be as serious as a permanent spinal cord injury with  paralysis.       9. Death:  Although rare, severe deadly allergic reactions known as "Anaphylactic  reaction" can occur to any of the medications used.      10. Worsening of the symptoms:  We can always make thing worse.  What are the chances of something like this happening? Chances of any of this occuring are extremely low.  By statistics, you have more of a chance of getting killed in a motor vehicle accident: while driving to the hospital than any of the above occurring .  Nevertheless, you should be aware that they are possibilities.  In general, it is similar to taking a shower.  Everybody knows that you can slip, hit your head and get killed.  Does that mean that you should not shower again?   Nevertheless always keep in mind that statistics do not mean anything if you happen to be on the wrong side of them.  Even if a procedure has a 1 (one) in a 1,000,000 (million) chance of going wrong, it you happen to be that one..Also, keep in mind that by statistics, you have more of a chance of having something go wrong when taking medications.  Who should not have this procedure? If you are on a blood thinning medication (e.g. Coumadin, Plavix, see list of "Blood Thinners"), or if you have an active infection going on, you should not have the procedure.  If you are taking any blood thinners, please inform your physician.  How should I prepare for this procedure?  Do not eat or drink anything at least six hours prior to the procedure.  Bring a driver with you .  It cannot  be a taxi.  Come accompanied by an adult that can drive you back, and that is strong enough to help you if your legs get weak or numb from the local anesthetic.  Take all of your medicines the morning of the procedure with just enough water to swallow them.  If you have diabetes, make sure that you are scheduled to have your procedure done first thing in the morning, whenever possible.  If you have diabetes, take only half of your insulin dose and notify our nurse that you have done so as soon as you arrive at the clinic.  If you are diabetic, but only take blood sugar pills (oral hypoglycemic), then do not take them on the morning of your procedure.  You may take them after you have had the procedure.  Do not take aspirin or any aspirin-containing medications, at least eleven (11) days prior to the procedure.  They may prolong bleeding.  Wear loose fitting clothing that may be easy to take off and that you would not mind if it got stained with Betadine or blood.  Do not wear any jewelry or perfume  Remove any nail coloring.  It will interfere with some of our monitoring equipment.  NOTE: Remember that this is not  meant to be interpreted as a complete list of all possible complications.  Unforeseen problems may occur.  BLOOD THINNERS The following drugs contain aspirin or other products, which can cause increased bleeding during surgery and should not be taken for 2 weeks prior to and 1 week after surgery.  If you should need take something for relief of minor pain, you may take acetaminophen which is found in Tylenol,m Datril, Anacin-3 and Panadol. It is not blood thinner. The products listed below are.  Do not take any of the products listed below in addition to any listed on your instruction sheet.  A.P.C or A.P.C with Codeine Codeine Phosphate Capsules #3 Ibuprofen Ridaura  ABC compound Congesprin Imuran rimadil  Advil Cope Indocin Robaxisal  Alka-Seltzer Effervescent Pain Reliever and Antacid Coricidin or Coricidin-D  Indomethacin Rufen  Alka-Seltzer plus Cold Medicine Cosprin Ketoprofen S-A-C Tablets  Anacin Analgesic Tablets or Capsules Coumadin Korlgesic Salflex  Anacin Extra Strength Analgesic tablets or capsules CP-2 Tablets Lanoril Salicylate  Anaprox Cuprimine Capsules Levenox Salocol  Anexsia-D Dalteparin Magan Salsalate  Anodynos Darvon compound Magnesium Salicylate Sine-off  Ansaid Dasin Capsules Magsal Sodium Salicylate  Anturane Depen Capsules Marnal Soma  APF Arthritis pain formula Dewitt's Pills Measurin Stanback  Argesic Dia-Gesic Meclofenamic Sulfinpyrazone  Arthritis Bayer Timed Release Aspirin Diclofenac Meclomen Sulindac  Arthritis pain formula Anacin Dicumarol Medipren Supac  Analgesic (Safety coated) Arthralgen Diffunasal Mefanamic Suprofen  Arthritis Strength Bufferin Dihydrocodeine Mepro Compound Suprol  Arthropan liquid Dopirydamole Methcarbomol with Aspirin Synalgos  ASA tablets/Enseals Disalcid Micrainin Tagament  Ascriptin Doan's Midol Talwin  Ascriptin A/D Dolene Mobidin Tanderil  Ascriptin Extra Strength Dolobid Moblgesic Ticlid  Ascriptin with Codeine Doloprin or  Doloprin with Codeine Momentum Tolectin  Asperbuf Duoprin Mono-gesic Trendar  Aspergum Duradyne Motrin or Motrin IB Triminicin  Aspirin plain, buffered or enteric coated Durasal Myochrisine Trigesic  Aspirin Suppositories Easprin Nalfon Trillsate  Aspirin with Codeine Ecotrin Regular or Extra Strength Naprosyn Uracel  Atromid-S Efficin Naproxen Ursinus  Auranofin Capsules Elmiron Neocylate Vanquish  Axotal Emagrin Norgesic Verin  Azathioprine Empirin or Empirin with Codeine Normiflo Vitamin E  Azolid Emprazil Nuprin Voltaren  Bayer Aspirin plain, buffered or children's or timed BC Tablets or powders Encaprin Orgaran Warfarin Sodium  Buff-a-Comp  Enoxaparin Orudis Zorpin  Buff-a-Comp with Codeine Equegesic Os-Cal-Gesic   Buffaprin Excedrin plain, buffered or Extra Strength Oxalid   Bufferin Arthritis Strength Feldene Oxphenbutazone   Bufferin plain or Extra Strength Feldene Capsules Oxycodone with Aspirin   Bufferin with Codeine Fenoprofen Fenoprofen Pabalate or Pabalate-SF   Buffets II Flogesic Panagesic   Buffinol plain or Extra Strength Florinal or Florinal with Codeine Panwarfarin   Buf-Tabs Flurbiprofen Penicillamine   Butalbital Compound Four-way cold tablets Penicillin   Butazolidin Fragmin Pepto-Bismol   Carbenicillin Geminisyn Percodan   Carna Arthritis Reliever Geopen Persantine   Carprofen Gold's salt Persistin   Chloramphenicol Goody's Phenylbutazone   Chloromycetin Haltrain Piroxlcam   Clmetidine heparin Plaquenil   Cllnoril Hyco-pap Ponstel   Clofibrate Hydroxy chloroquine Propoxyphen         Before stopping any of these medications, be sure to consult the physician who ordered them.  Some, such as Coumadin (Warfarin) are ordered to prevent or treat serious conditions such as "deep thrombosis", "pumonary embolisms", and other heart problems.  The amount of time that you may need off of the medication may also vary with the medication and the reason for which you were  taking it.  If you are taking any of these medications, please make sure you notify your pain physician before you undergo any procedures.

## 2015-04-17 NOTE — Progress Notes (Signed)
Safety precautions to be maintained throughout the outpatient stay will include: orient to surroundings, keep bed in low position, maintain call bell within reach at all times, provide assistance with transfer out of bed and ambulation.  

## 2015-04-18 ENCOUNTER — Telehealth: Payer: Self-pay | Admitting: *Deleted

## 2015-04-18 NOTE — Telephone Encounter (Signed)
No problems post procedure phone call. 

## 2015-04-19 ENCOUNTER — Telehealth: Payer: Self-pay | Admitting: Pain Medicine

## 2015-04-19 ENCOUNTER — Telehealth: Payer: Self-pay | Admitting: *Deleted

## 2015-04-19 NOTE — Telephone Encounter (Signed)
patient instructed to call [name] for refill of pain meds since dr crisp has never written for her. Patient understood instructions and Dr. Metta Clines in aggreence with plan. States right knee is better since procedure but is still having pain. Patient to keep scheduled appointment for follow up and to call if there are any problems with getting [name] to fill  meds.

## 2015-04-19 NOTE — Telephone Encounter (Signed)
In extreme pain / cant take over counter meds please call

## 2015-04-22 ENCOUNTER — Telehealth: Payer: Self-pay | Admitting: Family Medicine

## 2015-04-22 MED ORDER — OXYCODONE-ACETAMINOPHEN 5-325 MG PO TABS: 1.0000 | ORAL_TABLET | Freq: Four times a day (QID) | ORAL | Status: DC | PRN

## 2015-04-22 NOTE — Telephone Encounter (Signed)
Pt called stated Dr. Metta Clines wanted to know if [facility] would be willing to write an RX for oxycodene for the pt. Pt stated after this Dr. Metta Clines would take over writing the RX after this. Please call pt if needed. Pharm is [facility] in [place]. Thanks.

## 2015-04-22 NOTE — Telephone Encounter (Signed)
rx done

## 2015-05-16 ENCOUNTER — Other Ambulatory Visit: Payer: Self-pay | Admitting: Family Medicine

## 2015-05-21 ENCOUNTER — Ambulatory Visit: Payer: PPO | Attending: Pain Medicine | Admitting: Pain Medicine

## 2015-05-21 ENCOUNTER — Encounter: Payer: Self-pay | Admitting: Pain Medicine

## 2015-05-21 VITALS — BP 151/70 | HR 69 | Temp 97.6°F | Resp 18 | Ht 60.0 in | Wt 220.0 lb

## 2015-05-21 DIAGNOSIS — M7060 Trochanteric bursitis, unspecified hip: Secondary | ICD-10-CM

## 2015-05-21 DIAGNOSIS — M170 Bilateral primary osteoarthritis of knee: Secondary | ICD-10-CM | POA: Diagnosis not present

## 2015-05-21 DIAGNOSIS — Z96651 Presence of right artificial knee joint: Secondary | ICD-10-CM

## 2015-05-21 DIAGNOSIS — M797 Fibromyalgia: Secondary | ICD-10-CM | POA: Diagnosis not present

## 2015-05-21 DIAGNOSIS — M1990 Unspecified osteoarthritis, unspecified site: Secondary | ICD-10-CM | POA: Diagnosis not present

## 2015-05-21 DIAGNOSIS — M5481 Occipital neuralgia: Secondary | ICD-10-CM

## 2015-05-21 DIAGNOSIS — E1340 Other specified diabetes mellitus with diabetic neuropathy, unspecified: Secondary | ICD-10-CM

## 2015-05-21 DIAGNOSIS — M069 Rheumatoid arthritis, unspecified: Secondary | ICD-10-CM | POA: Diagnosis not present

## 2015-05-21 DIAGNOSIS — M545 Low back pain: Secondary | ICD-10-CM | POA: Diagnosis present

## 2015-05-21 DIAGNOSIS — M47816 Spondylosis without myelopathy or radiculopathy, lumbar region: Secondary | ICD-10-CM

## 2015-05-21 DIAGNOSIS — M533 Sacrococcygeal disorders, not elsewhere classified: Secondary | ICD-10-CM

## 2015-05-21 DIAGNOSIS — M5136 Other intervertebral disc degeneration, lumbar region: Secondary | ICD-10-CM | POA: Insufficient documentation

## 2015-05-21 MED ORDER — OXYCODONE-ACETAMINOPHEN 5-325 MG PO TABS: ORAL_TABLET | ORAL | Status: DC

## 2015-05-21 NOTE — Patient Instructions (Addendum)
PLAN   Continue present medication oxycodone acetaminophen. This medication can cause respiratory depression excessive drowsiness and confusion. Be extremely cautious when taking this medication  F/U PCP [name] for evaliation of  BP and general medical  condition  F/U surgical evaluation. May consider pending follow-up evaluations  F/U neurological evaluation. May consider pending follow-up evaluations  May consider radiofrequency rhizolysis or intraspinal procedures pending response to present treatment and F/U evaluation   Patient to call [facility] Center should patient have concerns prior to scheduled return appointment.

## 2015-05-21 NOTE — Progress Notes (Signed)
Safety precautions to be maintained throughout the outpatient stay will include: orient to surroundings, keep bed in low position, maintain call bell within reach at all times, provide assistance with transfer out of bed and ambulation.  

## 2015-05-21 NOTE — Progress Notes (Signed)
   Subjective:    Patient ID: Cynthia Jones, female    DOB: 07/06/1941, 74 y.o.   MRN: [id number]  HPI  Patient is 74 year old female who returns to [facility] for further evaluation and treatment of pain involving the region of the lower back lower extremity region especially the region of the knee. Patient has significant improvement of her pain involving the knee following previous interventional treatment consisting of geniculate nerve blocks of the knee. We discussed patient's condition and will avoid additional interventional treatment at this time. Patient continues to benefit from previous procedure geniculate nerve blocks of the knee. We discussed patient's medications and decision was made to prescribe oxycodone acetaminophen follow patient on today's visitwe discussed patient's condition with patient including the medication and explained to patient that respiratory depression confusion and excessive sedation can occur with the medication prescribed for treatment of her pain. The patient expressed understanding and willingness to comply to avoid undesirable side effects. We will consider additional modification of treatment regimen at time return appointment as discussed and as explained to patient on today's visit. The patient in agreement with suggested treatment plan      Review of Systems     Objective:   Physical Exam  There was mild tends to palpation of the splenius capitis and occipitalis musculature regions. Palpation over the region of the acromioclavicular and glenohumeral joint region was with minimal tends to palpation. Patient appeared to be with bilaterally equal grip strength. Tinel and Phalen's maneuver were without increase of pain of significant degree. There was tends to palpation of the cervical facet cervical paraspinal musculature region and the thoracic facet thoracic paraspinal muscles region of mild to moderate degree. No crepitus of the thoracic  region was noted. Palpation over the lumbar paraspinal musculature region lumbar facet region associated with mild to moderate discomfort. Lateral bending rotation and extension and palpation of the lumbar facets reproduce mild to moderate discomfort. Straight leg raising was tolerates approximately 20 without increase of pain with dorsiflexion noted. There was negative clonus negative Homans. The knee was without increased warmth or erythema noted there was negative anterior and posterior drawer signs. There was mild tinnitus of the greater trochanteric region and iliotibial band region. No sensory deficit of dermatomal distribution of the lower extremities were noted. EHL strength appeared to be decreased. There was negative clonus negative Homans. Abdomen was nontender with no costovertebral angle tenderness noted.      Assessment & Plan:    Degenerative disc disease lumbar spine  Lumbar facet syndrome  Fibromyalgia  Osteoarthritis  Degenerative joint disease of knees  Rheumatoid arthritis    PLAN   Continue present medication. The patient was given prescription for oxycodone acetaminophen 5/325. The patient was cautioned regarding respiratory depression confusion excessive sedation and other side effects which can occur with this medication. The patient expressed understanding and willingness to comply with recommendations to avoid undesirable side effects of the medication  F/U PCP Dr. Dahlia Bailiff evaliation of  BP and general medical  condition  F/U surgical evaluation. May consider pending follow-up evaluations  F/U neurological evaluation. May consider pending follow-up evaluations  May consider radiofrequency rhizolysis or intraspinal procedures pending response to present treatment and F/U evaluation   Patient to call [facility] Management Center should patient have concerns prior to scheduled return appointment.

## 2015-06-11 ENCOUNTER — Ambulatory Visit (INDEPENDENT_AMBULATORY_CARE_PROVIDER_SITE_OTHER): Payer: PPO | Admitting: Family Medicine

## 2015-06-11 ENCOUNTER — Encounter: Payer: Self-pay | Admitting: Family Medicine

## 2015-06-11 VITALS — BP 134/78 | HR 67 | Temp 97.7°F | Ht 59.5 in | Wt 231.0 lb

## 2015-06-11 DIAGNOSIS — I10 Essential (primary) hypertension: Secondary | ICD-10-CM | POA: Diagnosis not present

## 2015-06-11 DIAGNOSIS — M170 Bilateral primary osteoarthritis of knee: Secondary | ICD-10-CM | POA: Diagnosis not present

## 2015-06-11 DIAGNOSIS — Z23 Encounter for immunization: Secondary | ICD-10-CM

## 2015-06-11 DIAGNOSIS — E119 Type 2 diabetes mellitus without complications: Secondary | ICD-10-CM

## 2015-06-11 DIAGNOSIS — E1340 Other specified diabetes mellitus with diabetic neuropathy, unspecified: Secondary | ICD-10-CM | POA: Diagnosis not present

## 2015-06-11 DIAGNOSIS — F329 Major depressive disorder, single episode, unspecified: Secondary | ICD-10-CM

## 2015-06-11 DIAGNOSIS — F32A Depression, unspecified: Secondary | ICD-10-CM

## 2015-06-11 LAB — BAYER DCA HB A1C WAIVED: HB A1C (BAYER DCA - WAIVED): 6.2 % (ref <7.0)

## 2015-06-11 NOTE — Progress Notes (Signed)
BP 134/78 mmHg  Pulse 67  Temp(Src) 97.7 F (36.5 C)  Ht 4' 11.5" (1.511 m)  Wt 231 lb (104.781 kg)  BMI 45.89 kg/m2  SpO2 99%   Subjective:    Patient ID: Cynthia Jones, female    DOB: July 05, 1941, 74 y.o.   MRN: [id number]  HPI: Cynthia Jones is a 74 y.o. female  Chief Complaint  Patient presents with  . Diabetes   Agents with diabetes working on her diet doing better hemoglobin A1c is down to 6.2 today. Patient not taking any diabetes medicines. Trouble is patient has gained 11 pounds!. Ration relates she hasn't been eating much but eating ice cream. :-(. Neuropathy doing okay with current treatment She'll with intertrigo still. Used Lotrisone cream and about when away but is gotten hot and sweaty and it has come back. Patient only used Lotrisone cream for a week or so. Is using some Gold Bond medicated powder.  Discussed nerves and depression patient doing okay but a lot of stress  Relevant past medical, surgical, family and social history reviewed and updated as indicated. Interim medical history since our last visit reviewed. Allergies and medications reviewed and updated.  Review of Systems  Constitutional: Negative.   Respiratory: Negative.   Cardiovascular: Negative.     Per HPI unless specifically indicated above     Objective:    BP 134/78 mmHg  Pulse 67  Temp(Src) 97.7 F (36.5 C)  Ht 4' 11.5" (1.511 m)  Wt 231 lb (104.781 kg)  BMI 45.89 kg/m2  SpO2 99%  Wt Readings from Last 3 Encounters:  06/11/15 231 lb (104.781 kg)  05/21/15 220 lb (99.791 kg)  04/17/15 220 lb (99.791 kg)    Physical Exam  Constitutional: She is oriented to person, place, and time. She appears well-developed and well-nourished. No distress.  HENT:  Head: Normocephalic and atraumatic.  Right Ear: Hearing normal.  Left Ear: Hearing normal.  Nose: Nose normal.  Eyes: Conjunctivae and lids are normal. Right eye exhibits no discharge. Left eye exhibits no discharge.  No scleral icterus.  Cardiovascular: Normal rate, regular rhythm and normal heart sounds.   Pulmonary/Chest: Effort normal and breath sounds normal. No respiratory distress.  Musculoskeletal: Normal range of motion.  Neurological: She is alert and oriented to person, place, and time.  Skin: Skin is intact. No rash noted.  Psychiatric: She has a normal mood and affect. Her speech is normal and behavior is normal. Judgment and thought content normal. Cognition and memory are normal.    Results for orders placed or performed in visit on 03/11/15  Comprehensive metabolic panel  Result Value Ref Range   Glucose 127 (H) 65 - 99 mg/dL   BUN 11 8 - 27 mg/dL   Creatinine, Ser 0.86 0.57 - 1.00 mg/dL   GFR calc non Af Amer 76 >59 mL/min/1.73   GFR calc Af Amer 88 >59 mL/min/1.73   BUN/Creatinine Ratio 14 11 - 26   Sodium 142 134 - 144 mmol/L   Potassium 4.3 3.5 - 5.2 mmol/L   Chloride 102 97 - 108 mmol/L   CO2 23 18 - 29 mmol/L   Calcium 9.2 8.7 - 10.3 mg/dL   Total Protein 7.1 6.0 - 8.5 g/dL   Albumin 4.2 3.5 - 4.8 g/dL   Globulin, Total 2.9 1.5 - 4.5 g/dL   Albumin/Globulin Ratio 1.4 1.1 - 2.5   Bilirubin Total 0.4 0.0 - 1.2 mg/dL   Alkaline Phosphatase 50 39 - 117 IU/L  AST 18 0 - 40 IU/L   ALT 18 0 - 32 IU/L  CBC with Differential/Platelet  Result Value Ref Range   WBC 8.0 3.4 - 10.8 x10E3/uL   RBC 4.46 3.77 - 5.28 x10E6/uL   Hemoglobin 13.9 11.1 - 15.9 g/dL   Hematocrit 16.0 73.7 - 46.6 %   MCV 91 79 - 97 fL   MCH 31.2 26.6 - 33.0 pg   MCHC 34.4 31.5 - 35.7 g/dL   RDW 10.6 26.9 - 48.5 %   Platelets 340 150 - 379 x10E3/uL   Neutrophils 66 %   Lymphs 22 %   Monocytes 10 %   Eos 1 %   Basos 1 %   Neutrophils Absolute 5.3 1.4 - 7.0 x10E3/uL   Lymphocytes Absolute 1.8 0.7 - 3.1 x10E3/uL   Monocytes Absolute 0.8 0.1 - 0.9 x10E3/uL   EOS (ABSOLUTE) 0.1 0.0 - 0.4 x10E3/uL   Basophils Absolute 0.1 0.0 - 0.2 x10E3/uL   Immature Granulocytes 0 %   Immature Grans (Abs) 0.0 0.0 -  0.1 x10E3/uL  Urinalysis, Routine w reflex microscopic (not at [facility])  Result Value Ref Range   Specific Gravity, UA 1.015 1.005 - 1.030   pH, UA 5.5 5.0 - 7.5   Color, UA Yellow Yellow   Appearance Ur Clear Clear   Leukocytes, UA Negative Negative   Protein, UA Negative Negative/Trace   Glucose, UA Negative Negative   Ketones, UA Negative Negative   RBC, UA Negative Negative   Bilirubin, UA Negative Negative   Urobilinogen, Ur 0.2 0.2 - 1.0 mg/dL   Nitrite, UA Negative Negative  Lipid panel  Result Value Ref Range   Cholesterol, Total 213 (H) 100 - 199 mg/dL   Triglycerides 462 0 - 149 mg/dL   HDL 69 >70 mg/dL   VLDL Cholesterol Cal 23 5 - 40 mg/dL   LDL Calculated 350 (H) 0 - 99 mg/dL   Chol/HDL Ratio 3.1 0.0 - 4.4 ratio units  TSH  Result Value Ref Range   TSH 4.230 0.450 - 4.500 uIU/mL  Hemoglobin A1c  Result Value Ref Range   Hgb A1c MFr Bld 6.6 (H) 4.8 - 5.6 %   Est. average glucose Bld gHb Est-mCnc 143 mg/dL      Assessment & Plan:   Problem List Items Addressed This Visit      Cardiovascular and Mediastinum   Hypertension    The current medical regimen is effective;  continue present plan and medications.         Endocrine   Neuropathy due to secondary diabetes [facility])    The current medical regimen is effective;  continue present plan and medications.         Musculoskeletal and Integument   Degenerative joint disease of knee    Going to pain clinic and having adequate control        Other   Depression    stable       Other Visit Diagnoses    Diabetes mellitus without complication (HCC)    -  Primary    Relevant Orders    Bayer DCA Hb A1c Waived    Immunization due        Relevant Orders    Flu Vaccine QUAD 36+ mos PF IM (Fluarix & Fluzone Quad PF) (Completed)        Follow up plan: Return in about 3 months (around 09/11/2015), or if symptoms worsen or fail to improve, for Recheck hemoglobin A1c, BMP, lipids, ALT, AST.

## 2015-06-11 NOTE — Assessment & Plan Note (Signed)
The current medical regimen is effective;  continue present plan and medications.  

## 2015-06-11 NOTE — Assessment & Plan Note (Signed)
stable °

## 2015-06-11 NOTE — Assessment & Plan Note (Signed)
Going to pain clinic and having adequate control

## 2015-06-13 ENCOUNTER — Other Ambulatory Visit: Payer: Self-pay | Admitting: Family Medicine

## 2015-06-18 ENCOUNTER — Ambulatory Visit: Payer: PPO | Attending: Pain Medicine | Admitting: Pain Medicine

## 2015-06-18 ENCOUNTER — Encounter: Payer: Self-pay | Admitting: Pain Medicine

## 2015-06-18 VITALS — BP 132/62 | HR 72 | Temp 97.6°F | Resp 18 | Ht 60.0 in | Wt 220.0 lb

## 2015-06-18 DIAGNOSIS — M7060 Trochanteric bursitis, unspecified hip: Secondary | ICD-10-CM

## 2015-06-18 DIAGNOSIS — M069 Rheumatoid arthritis, unspecified: Secondary | ICD-10-CM | POA: Diagnosis not present

## 2015-06-18 DIAGNOSIS — M170 Bilateral primary osteoarthritis of knee: Secondary | ICD-10-CM

## 2015-06-18 DIAGNOSIS — M25562 Pain in left knee: Secondary | ICD-10-CM | POA: Diagnosis present

## 2015-06-18 DIAGNOSIS — M47816 Spondylosis without myelopathy or radiculopathy, lumbar region: Secondary | ICD-10-CM

## 2015-06-18 DIAGNOSIS — M545 Low back pain: Secondary | ICD-10-CM | POA: Diagnosis present

## 2015-06-18 DIAGNOSIS — M5481 Occipital neuralgia: Secondary | ICD-10-CM

## 2015-06-18 DIAGNOSIS — M533 Sacrococcygeal disorders, not elsewhere classified: Secondary | ICD-10-CM

## 2015-06-18 DIAGNOSIS — M19041 Primary osteoarthritis, right hand: Secondary | ICD-10-CM | POA: Insufficient documentation

## 2015-06-18 DIAGNOSIS — M797 Fibromyalgia: Secondary | ICD-10-CM | POA: Diagnosis not present

## 2015-06-18 DIAGNOSIS — M19042 Primary osteoarthritis, left hand: Secondary | ICD-10-CM | POA: Insufficient documentation

## 2015-06-18 DIAGNOSIS — Z96651 Presence of right artificial knee joint: Secondary | ICD-10-CM

## 2015-06-18 DIAGNOSIS — E1340 Other specified diabetes mellitus with diabetic neuropathy, unspecified: Secondary | ICD-10-CM

## 2015-06-18 DIAGNOSIS — M5136 Other intervertebral disc degeneration, lumbar region: Secondary | ICD-10-CM | POA: Diagnosis not present

## 2015-06-18 DIAGNOSIS — M25561 Pain in right knee: Secondary | ICD-10-CM | POA: Diagnosis present

## 2015-06-18 MED ORDER — OXYCODONE-ACETAMINOPHEN 5-325 MG PO TABS: ORAL_TABLET | ORAL | Status: DC

## 2015-06-18 NOTE — Patient Instructions (Addendum)
PLAN   Continue present medication oxycodone   F/U PCP Dr. Crissman for evaliation of  BP and general medical  condition  F/U surgical evaluation. May consider pending follow-up evaluations  F/U neurological evaluation. May consider pending follow-up evaluations  May consider radiofrequency rhizolysis or intraspinal procedures pending response to present treatment and F/U evaluation   Patient to call Pain Management Center should patient have concerns prior to scheduled return appointment. 

## 2015-06-18 NOTE — Progress Notes (Signed)
   Subjective:    Patient ID: Cynthia Jones, female    DOB: Jul 01, 1941, 74 y.o.   MRN: [id number]  HPI Patient 74 year old female who [facility] and treatment of pain involving the region of the lower back and knees. Patient is status post geniculate nerve blocks of the knees with greatest then 75% relief of pain following the procedure. Patient states that she is with pain well-controlled at this time. We will continue patient's medications and avoid additional interventional treatment at this time. The patient is understanding and in agreement to treatment plan. Patient also with complaint of pain involving the hands. We will utilize patient follow-up with Dr. Oswaldo Done was performed surgery of the upper extremities. The patient was with understanding and in agreement status treatment plan.   Review of Systems     Objective:   Physical Exam  There was tends to palpation splenius capitis and occipitalis musculature region. Palpation of the cervical facet cervical paraspinal muscle region was with minimal tends to palpation. There appeared to be unremarkable Spurling's maneuver. Patient was with decreased grip strength. Well-healed scars of the upper extremities noted without increased warmth erythema of the upper extremities noted patient was with decreased grip strength. There is to palpation at the carpometacarpal joint of the hand with no significant swelling increased warmth erythema. Tinel and Phalen's maneuver associated with mild discomfort. She'll the lumbar paraspinal muscle lumbar facet region was tends to palpation of mild to moderate degree. There was mild to moderate tends to palpation of the thoracic facet thoracic paraspinal musculature region. No crepitus of the thoracic region noted. Palpation of the PSIS and PII S region reproduced mild discomfort. Outpatient of the lumbar paraspinal musculature region with lateral bending and  rotation and extension to palpation lumbar facets reproduce mild discomfort. There was mild tinnitus of the greater trochanteric region iliotibial band region. No sensory deficit of dermatomal distribution lower extremities noted. Knee was without excessive tends to palpation. There is crepitus of the knee. Warmth erythema of the knee noted. EHL strength was decreased. Minimal discomfort noted with range of motion maneuvers of the knee. There was negative clonus negative Homans Abdomen was nontender with no costovertebral maintenance noted.      Assessment & Plan:    Degenerative disc disease lumbar spine  Lumbar facet syndrome  Fibromyalgia  Osteoarthritis  Degenerative joint disease of knees and hands  Rheumatoid arthritis     PLAN   Continue present medication oxycodone acetaminophen  F/U PCP [name] for evaliation of  BP and general medical  condition  F/U surgical evaluation with Dr. Erby Pian for evaluation of hands as discussed  F/U neurological evaluation. May consider pending follow-up evaluations  May consider radiofrequency rhizolysis or intraspinal procedures pending response to present treatment and F/U evaluation   [facility] to call [facility] should patient have concerns prior to scheduled return appointment.

## 2015-06-18 NOTE — Progress Notes (Signed)
Safety precautions to be maintained throughout the outpatient stay will include: orient to surroundings, keep bed in low position, maintain call bell within reach at all times, provide assistance with transfer out of bed and ambulation.  

## 2015-07-16 ENCOUNTER — Other Ambulatory Visit: Payer: Self-pay | Admitting: Family Medicine

## 2015-07-18 ENCOUNTER — Ambulatory Visit: Payer: PPO | Attending: Pain Medicine | Admitting: Pain Medicine

## 2015-07-18 ENCOUNTER — Encounter: Payer: Self-pay | Admitting: Pain Medicine

## 2015-07-18 VITALS — BP 151/66 | HR 71 | Temp 99.2°F | Resp 18 | Ht 60.0 in | Wt 229.0 lb

## 2015-07-18 DIAGNOSIS — M545 Low back pain: Secondary | ICD-10-CM | POA: Diagnosis present

## 2015-07-18 DIAGNOSIS — M5136 Other intervertebral disc degeneration, lumbar region: Secondary | ICD-10-CM | POA: Diagnosis not present

## 2015-07-18 DIAGNOSIS — M069 Rheumatoid arthritis, unspecified: Secondary | ICD-10-CM | POA: Insufficient documentation

## 2015-07-18 DIAGNOSIS — M170 Bilateral primary osteoarthritis of knee: Secondary | ICD-10-CM

## 2015-07-18 DIAGNOSIS — M79605 Pain in left leg: Secondary | ICD-10-CM | POA: Diagnosis present

## 2015-07-18 DIAGNOSIS — E1340 Other specified diabetes mellitus with diabetic neuropathy, unspecified: Secondary | ICD-10-CM

## 2015-07-18 DIAGNOSIS — M5481 Occipital neuralgia: Secondary | ICD-10-CM

## 2015-07-18 DIAGNOSIS — M7060 Trochanteric bursitis, unspecified hip: Secondary | ICD-10-CM

## 2015-07-18 DIAGNOSIS — M19041 Primary osteoarthritis, right hand: Secondary | ICD-10-CM | POA: Insufficient documentation

## 2015-07-18 DIAGNOSIS — M533 Sacrococcygeal disorders, not elsewhere classified: Secondary | ICD-10-CM

## 2015-07-18 DIAGNOSIS — M19042 Primary osteoarthritis, left hand: Secondary | ICD-10-CM | POA: Insufficient documentation

## 2015-07-18 DIAGNOSIS — Z96651 Presence of right artificial knee joint: Secondary | ICD-10-CM

## 2015-07-18 DIAGNOSIS — M797 Fibromyalgia: Secondary | ICD-10-CM | POA: Diagnosis not present

## 2015-07-18 DIAGNOSIS — M47816 Spondylosis without myelopathy or radiculopathy, lumbar region: Secondary | ICD-10-CM

## 2015-07-18 DIAGNOSIS — M79604 Pain in right leg: Secondary | ICD-10-CM | POA: Diagnosis present

## 2015-07-18 MED ORDER — OXYCODONE-ACETAMINOPHEN 5-325 MG PO TABS: ORAL_TABLET | ORAL | Status: DC

## 2015-07-18 MED ORDER — OXYCODONE HCL 5 MG PO TABA: ORAL_TABLET | ORAL | Status: DC

## 2015-07-18 MED ORDER — OXYCODONE HCL 30 MG PO TABS: ORAL_TABLET | ORAL | Status: DC

## 2015-07-18 MED ORDER — OXYCODONE HCL 5 MG PO TABS: ORAL_TABLET | ORAL | Status: DC

## 2015-07-18 NOTE — Progress Notes (Signed)
Safety precautions to be maintained throughout the outpatient stay will include: orient to surroundings, keep bed in low position, maintain call bell within reach at all times, provide assistance with transfer out of bed and ambulation.  Ocycodone script given to patient.

## 2015-07-18 NOTE — Patient Instructions (Addendum)
PLAN   cc 

## 2015-07-18 NOTE — Progress Notes (Signed)
   Subjective:    Patient ID: Cynthia Jones, female    DOB: 25-Dec-1940, 74 y.o.   MRN: [id number]  HPI The patient is a 74 year old female who [facility] for further evaluation and treatment of pain involving the region of the lower back and lower extremity regions. Patient has had significant improvement of pain involving the knees following geniculate nerve blocks of the knees. Patient continues to do very well in terms of relief of pain of the knees. The patient stated that she fell out of her chair landed on her buttocks a few days ago when the chair rolled out from under her. Patient denied and significant trauma and denied any increased lower back pain lower extremity pain numbness or tingling. We discussed patient's condition and will continue present medications at this time and will consider modifications of treatment should there be change in condition. The patient agreed to suggested treatment plan.      Review of Systems     Objective:   Physical Exam   There was tenderness to palpation over the region of the cervical facet cervical paraspinal musculature region of minimal degree. There was minimal tenderness to palpation over the region of the thoracic facet thoracic paraspinal musculature region. Patient appeared to be with bilaterally equal grip strength and Tinel and Phalen's maneuver were without increased pain of significant degree. There was unremarkable Spurling's maneuver. Palpation over the thoracic facet thoracic paraspinal musculature region was without increased pain of significant degree. Palpation over the lumbar paraspinal muscles lumbar facet region was with mild discomfort. Lateral bending and rotation extension and palpation of the lumbar facets reproduce mild discomfort. Straight leg raise was tolerates approximately 20 without increased pain with dorsiflexion noted.. No sensory deficit or dermatomal distribution was detected. There was  negative clonus negative Homans. There was minimal tenderness of the PSIS and PI is region as well as the greater trochanteric region and iliotibial band region. Abdomen was protuberant and nontender. No costovertebral tenderness was noted.     Assessment & Plan:  Degenerative disc disease lumbar spine  Lumbar facet syndrome  Fibromyalgia  Osteoarthritis  Degenerative joint disease of knees and hands  Rheumatoid arthritis      PLAN  Continue present medication oxycodone  F/U PCP [name] for evaliation of  BP and general medical  condition  F/U surgical evaluation. May consider pending follow-up evaluations  F/U neurological evaluation. May consider pending follow-up evaluations  May consider radiofrequency rhizolysis or intraspinal procedures pending response to present treatment and F/U evaluation   [facility] to call [facility] should patient have concerns prior to scheduled return appointment.

## 2015-07-18 NOTE — Progress Notes (Signed)
Safety precautions to be maintained throughout the outpatient stay will include: orient to surroundings, keep bed in low position, maintain call bell within reach at all times, provide assistance with transfer out of bed and ambulation.  

## 2015-08-01 ENCOUNTER — Other Ambulatory Visit: Payer: Self-pay | Admitting: Pain Medicine

## 2015-08-05 ENCOUNTER — Ambulatory Visit (INDEPENDENT_AMBULATORY_CARE_PROVIDER_SITE_OTHER): Payer: PPO | Admitting: Family Medicine

## 2015-08-05 ENCOUNTER — Encounter: Payer: Self-pay | Admitting: Family Medicine

## 2015-08-05 VITALS — BP 129/75 | HR 73 | Temp 98.5°F | Ht 59.0 in | Wt 228.0 lb

## 2015-08-05 DIAGNOSIS — L304 Erythema intertrigo: Secondary | ICD-10-CM

## 2015-08-05 DIAGNOSIS — L739 Follicular disorder, unspecified: Secondary | ICD-10-CM

## 2015-08-05 DIAGNOSIS — J019 Acute sinusitis, unspecified: Secondary | ICD-10-CM | POA: Diagnosis not present

## 2015-08-05 DIAGNOSIS — J329 Chronic sinusitis, unspecified: Secondary | ICD-10-CM | POA: Insufficient documentation

## 2015-08-05 MED ORDER — MUPIROCIN 2 % EX OINT: 1.0000 "application " | TOPICAL_OINTMENT | Freq: Two times a day (BID) | CUTANEOUS | Status: DC

## 2015-08-05 MED ORDER — AZITHROMYCIN 250 MG PO TABS: ORAL_TABLET | ORAL | Status: DC

## 2015-08-05 MED ORDER — FLUCONAZOLE 100 MG PO TABS: 100.0000 mg | ORAL_TABLET | Freq: Every day | ORAL | Status: DC

## 2015-08-05 NOTE — Progress Notes (Signed)
   BP 129/75 mmHg  Pulse 73  Temp(Src) 98.5 F (36.9 C)  Ht 4\' 11"  (1.499 m)  Wt 228 lb (103.42 kg)  BMI 46.03 kg/m2  SpO2 99%   Subjective:    Patient ID: , female    DOB: February 01, 1941, 74 y.o.   MRN: [id number]  HPI: Cynthia Jones is a 74 y.o. female  Chief Complaint  Patient presents with  . URI  . Rash    under breasts   Patient with almost a week of facial pain and headache congestion and facial pressure sensation of sloshing pressure with bending over Patient's been running some low-grade fever Taking some Coricidin with minimal help. Patient's also having marked intertrigo changes under her breasts no response to multiple over-the-counter medications as recommended by me patient uses faithfully for months now with no help Patient's also developed axillary folliculitis changes  Relevant past medical, surgical, family and social history reviewed and updated as indicated. Interim medical history since our last visit reviewed. Allergies and medications reviewed and updated.  Review of Systems  Constitutional: Negative.   Respiratory: Negative.   Cardiovascular: Negative.     Per HPI unless specifically indicated above     Objective:    BP 129/75 mmHg  Pulse 73  Temp(Src) 98.5 F (36.9 C)  Ht 4\' 11"  (1.499 m)  Wt 228 lb (103.42 kg)  BMI 46.03 kg/m2  SpO2 99%  Wt Readings from Last 3 Encounters:  08/05/15 228 lb (103.42 kg)  07/18/15 229 lb (103.874 kg)  06/18/15 220 lb (99.791 kg)    Physical Exam  Constitutional: She is oriented to person, place, and time. She appears well-developed and well-nourished. No distress.  HENT:  Head: Normocephalic and atraumatic.  Right Ear: Hearing normal.  Left Ear: Hearing normal.  Nose: Nose normal.  Eyes: Conjunctivae and lids are normal. Right eye exhibits no discharge. Left eye exhibits no discharge. No scleral icterus.  Cardiovascular: Normal rate, regular rhythm and normal heart sounds.    Pulmonary/Chest: Effort normal. No respiratory distress.  Musculoskeletal: Normal range of motion.  Neurological: She is alert and oriented to person, place, and time.  Skin: Skin is intact. Rash noted.  Marked intertrigo changes under her breasts Folliculitis changes and folliculitis both axilla changes  Psychiatric: She has a normal mood and affect. Her speech is normal and behavior is normal. Judgment and thought content normal. Cognition and memory are normal.    Results for orders placed or performed in visit on 06/11/15  Bayer DCA Hb A1c Waived  Result Value Ref Range   Bayer DCA Hb A1c Waived 6.2 <7.0 %      Assessment & Plan:   Problem List Items Addressed This Visit      Respiratory   Sinusitis    Discussed sinusitis care and treatment use of over-the-counter medications Tylenol discussed patient can take a Z-Pak in spite of listed allergy to erythromycin        Relevant Medications   fluconazole (DIFLUCAN) 100 MG tablet   azithromycin (ZITHROMAX) 250 MG tablet     Musculoskeletal and Integument   Intertrigo - Primary    Have tried topicals will go with Diflucan      Folliculitis    We'll use use Bactroban ointment patient education given          Follow up plan: Return if symptoms worsen or fail to improve, for As scheduled.

## 2015-08-05 NOTE — Assessment & Plan Note (Signed)
We'll use use Bactroban ointment patient education given

## 2015-08-05 NOTE — Assessment & Plan Note (Signed)
Have tried topicals will go with Diflucan

## 2015-08-05 NOTE — Assessment & Plan Note (Signed)
Discussed sinusitis care and treatment use of over-the-counter medications Tylenol discussed patient can take a Z-Pak in spite of listed allergy to erythromycin

## 2015-08-15 ENCOUNTER — Encounter: Payer: Self-pay | Admitting: Pain Medicine

## 2015-08-15 ENCOUNTER — Ambulatory Visit: Payer: PPO | Attending: Pain Medicine | Admitting: Pain Medicine

## 2015-08-15 VITALS — BP 149/59 | HR 67 | Temp 95.6°F | Ht 60.0 in | Wt 228.0 lb

## 2015-08-15 DIAGNOSIS — M1990 Unspecified osteoarthritis, unspecified site: Secondary | ICD-10-CM | POA: Insufficient documentation

## 2015-08-15 DIAGNOSIS — M19041 Primary osteoarthritis, right hand: Secondary | ICD-10-CM | POA: Diagnosis not present

## 2015-08-15 DIAGNOSIS — M797 Fibromyalgia: Secondary | ICD-10-CM | POA: Diagnosis not present

## 2015-08-15 DIAGNOSIS — M5481 Occipital neuralgia: Secondary | ICD-10-CM

## 2015-08-15 DIAGNOSIS — M170 Bilateral primary osteoarthritis of knee: Secondary | ICD-10-CM

## 2015-08-15 DIAGNOSIS — E1340 Other specified diabetes mellitus with diabetic neuropathy, unspecified: Secondary | ICD-10-CM

## 2015-08-15 DIAGNOSIS — Z96651 Presence of right artificial knee joint: Secondary | ICD-10-CM

## 2015-08-15 DIAGNOSIS — M47816 Spondylosis without myelopathy or radiculopathy, lumbar region: Secondary | ICD-10-CM

## 2015-08-15 DIAGNOSIS — M546 Pain in thoracic spine: Secondary | ICD-10-CM | POA: Diagnosis present

## 2015-08-15 DIAGNOSIS — M7060 Trochanteric bursitis, unspecified hip: Secondary | ICD-10-CM

## 2015-08-15 DIAGNOSIS — M5136 Other intervertebral disc degeneration, lumbar region: Secondary | ICD-10-CM | POA: Diagnosis not present

## 2015-08-15 DIAGNOSIS — M19042 Primary osteoarthritis, left hand: Secondary | ICD-10-CM | POA: Insufficient documentation

## 2015-08-15 DIAGNOSIS — M069 Rheumatoid arthritis, unspecified: Secondary | ICD-10-CM | POA: Insufficient documentation

## 2015-08-15 DIAGNOSIS — M545 Low back pain: Secondary | ICD-10-CM | POA: Diagnosis present

## 2015-08-15 DIAGNOSIS — M533 Sacrococcygeal disorders, not elsewhere classified: Secondary | ICD-10-CM

## 2015-08-15 MED ORDER — OXYCODONE HCL 5 MG PO TABS: ORAL_TABLET | ORAL | Status: DC

## 2015-08-15 NOTE — Progress Notes (Signed)
   Subjective:    Patient ID: Cynthia Jones, female    DOB: 1941-01-03, 74 y.o.   MRN: [id number]  HPI   The patient is a 74 year old female who returns to [facility] for further evaluation and treatment of pain involving the region of the upper mid lower back and lower extremity regions. The patient states that her knees continue to feel good after undergoing geniculate nerve blocks of the knee. The patient states that she has some pain occurring across the back and radiates toward the buttocks and interferes the patient ability to stand erect. Patient denies any recent trauma change in events of daily living the call significant change in symptomatology. Patient states the pain is present throughout the day becoming more intense as the day progresses with a tight sensation of the lower back and pressure-like sensation radiating to the buttocks on the left as well as on the right. We will remain available to consider patient for interventional treatment as well as additional modifications of treatment regimen at the present time we will continue patient's oxycodone as prescribed and consider additional modifications of treatment pending follow-up evaluation. The patient was understanding and agreed to suggested treatment plan.        Review of Systems     Objective:   Physical Exam  There was mild tenderness to palpation of the paraspinal musculatures and cervical region cervical facet region. There was minimal tenderness of the splenius capitis and occipitalis musculature region. Patient was with minimal tenderness of the acromial clavicular and glenohumeral joint region and patient was with unremarkable Spurling's maneuver with bilaterally equal grip strength and Tinel and Phalen's maneuver without increased pain of significant degree. Palpation over the thoracic region thoracic facet region was attends to palpation of mild to moderate degree. Lateral bending rotation  extension and palpation of the lumbar facet region was attends to palpation of mild to moderate degree as well no crepitus of the thoracic region was noted. There was tenderness over the PSIS and PII S region as well as the gluteal and piriformis musculature region. Straight leg raising was tolerates approximately 20 without increase of pain with dorsiflexion noted. There was tenderness to palpation of the knees with negative anterior and posterior drawer signs without ballottement of the patella. No sensory deficit or dermatomal distribution of the lower extremities noted. EHL strength appeared to be slightly decreased. There was negative clonus negative Homans. Abdomen was nontender with no costovertebral tenderness noted      Assessment & Plan:     Degenerative disc disease lumbar spine  Lumbar facet syndrome  Fibromyalgia  Osteoarthritis  Degenerative joint disease of knees and hands  Rheumatoid arthritis     PLAN   Continue present medication oxycodone   F/U PCP [name] for evaliation of  BP and general medical  condition  F/U surgical evaluation. May consider pending follow-up evaluations  F/U neurological evaluation. May consider pending follow-up evaluations  May consider radiofrequency rhizolysis or intraspinal procedures pending response to present treatment and F/U evaluation   [facility] to call [facility] should patient have concerns prior to scheduled return appointment.

## 2015-08-15 NOTE — Patient Instructions (Signed)
PLAN   Continue present medication oxycodone   F/U PCP [name] for evaliation of  BP and general medical  condition  F/U surgical evaluation. May consider pending follow-up evaluations  F/U neurological evaluation. May consider pending follow-up evaluations  May consider radiofrequency rhizolysis or intraspinal procedures pending response to present treatment and F/U evaluation   Patient to call [facility] should patient have concerns prior to scheduled return appointment.

## 2015-08-15 NOTE — Progress Notes (Signed)
Safety precautions to be maintained throughout the outpatient stay will include: orient to surroundings, keep bed in low position, maintain call bell within reach at all times, provide assistance with transfer out of bed and ambulation.  

## 2015-08-21 ENCOUNTER — Telehealth: Payer: Self-pay | Admitting: Family Medicine

## 2015-08-21 MED ORDER — AZITHROMYCIN 250 MG PO TABS: ORAL_TABLET | ORAL | Status: DC

## 2015-08-21 NOTE — Telephone Encounter (Signed)
Pt has sore throat and would like to send something to [facility]

## 2015-08-27 ENCOUNTER — Encounter: Payer: Self-pay | Admitting: Emergency Medicine

## 2015-08-27 ENCOUNTER — Emergency Department
Admission: EM | Admit: 2015-08-27 | Discharge: 2015-08-27 | Disposition: A | Discharge status: Home / Self Care | Payer: PPO | Attending: Emergency Medicine | Admitting: Emergency Medicine

## 2015-08-27 DIAGNOSIS — Z79899 Other long term (current) drug therapy: Secondary | ICD-10-CM | POA: Insufficient documentation

## 2015-08-27 DIAGNOSIS — E0840 Diabetes mellitus due to underlying condition with diabetic neuropathy, unspecified: Secondary | ICD-10-CM | POA: Insufficient documentation

## 2015-08-27 DIAGNOSIS — Z792 Long term (current) use of antibiotics: Secondary | ICD-10-CM | POA: Diagnosis not present

## 2015-08-27 DIAGNOSIS — N764 Abscess of vulva: Secondary | ICD-10-CM | POA: Insufficient documentation

## 2015-08-27 DIAGNOSIS — L02412 Cutaneous abscess of left axilla: Secondary | ICD-10-CM | POA: Diagnosis not present

## 2015-08-27 DIAGNOSIS — I10 Essential (primary) hypertension: Secondary | ICD-10-CM | POA: Insufficient documentation

## 2015-08-27 DIAGNOSIS — B379 Candidiasis, unspecified: Secondary | ICD-10-CM | POA: Insufficient documentation

## 2015-08-27 DIAGNOSIS — L03012 Cellulitis of left finger: Secondary | ICD-10-CM | POA: Diagnosis not present

## 2015-08-27 DIAGNOSIS — L309 Dermatitis, unspecified: Secondary | ICD-10-CM | POA: Diagnosis not present

## 2015-08-27 DIAGNOSIS — L0291 Cutaneous abscess, unspecified: Secondary | ICD-10-CM

## 2015-08-27 DIAGNOSIS — B372 Candidiasis of skin and nail: Secondary | ICD-10-CM

## 2015-08-27 DIAGNOSIS — L089 Local infection of the skin and subcutaneous tissue, unspecified: Secondary | ICD-10-CM | POA: Diagnosis not present

## 2015-08-27 MED ORDER — SULFAMETHOXAZOLE-TRIMETHOPRIM 800-160 MG PO TABS: 2.0000 | ORAL_TABLET | Freq: Two times a day (BID) | ORAL | Status: DC

## 2015-08-27 NOTE — Discharge Instructions (Signed)
Cellulitis Cellulitis is an infection of the skin and the tissue beneath it. The infected area is usually red and tender. Cellulitis occurs most often in the arms and lower legs.  CAUSES  Cellulitis is caused by bacteria that enter the skin through cracks or cuts in the skin. The most common types of bacteria that cause cellulitis are staphylococci and streptococci. SIGNS AND SYMPTOMS   Redness and warmth.  Swelling.  Tenderness or pain.  Fever. DIAGNOSIS  Your health care provider can usually determine what is wrong based on a physical exam. Blood tests may also be done. TREATMENT  Treatment usually involves taking an antibiotic medicine. HOME CARE INSTRUCTIONS   Take your antibiotic medicine as directed by your health care provider. Finish the antibiotic even if you start to feel better.  Keep the infected arm or leg elevated to reduce swelling.  Apply a warm cloth to the affected area up to 4 times per day to relieve pain.  Take medicines only as directed by your health care provider.  Keep all follow-up visits as directed by your health care provider. SEEK MEDICAL CARE IF:   You notice red streaks coming from the infected area.  Your red area gets larger or turns dark in color.  Your bone or joint underneath the infected area becomes painful after the skin has healed.  Your infection returns in the same area or another area.  You notice a swollen bump in the infected area.  You develop new symptoms.  You have a fever. SEEK IMMEDIATE MEDICAL CARE IF:   You feel very sleepy.  You develop vomiting or diarrhea.  You have a general ill feeling (malaise) with muscle aches and pains.   This information is not intended to replace advice given to you by your health care provider. Make sure you discuss any questions you have with your health care provider.   Document Released: 05/27/2005 Document Revised: 05/08/2015 Document Reviewed: 11/02/2011 Elsevier Interactive  Patient Education 2016 [employer]  Paronychia  Paronychia is an infection of the skin. It happens near a fingernail or toenail. It may cause pain and swelling around the nail. Usually, it is not serious and it clears up with treatment. HOME CARE  Soak the fingers or toes in warm water as told by your doctor. You may be told to do this for 20 minutes, 2-3 times a day.  Keep the area dry when you are not soaking it.  Take medicines only as told by your doctor.  If you were given an antibiotic medicine, finish all of it even if you start to feel better.  Keep the affected area clean.  Do not try to drain a fluid-filled bump yourself.  Wear rubber gloves when putting your hands in water.  Wear gloves if your hands might touch cleaners or chemicals.  Follow your doctor's instructions about:  Wound care.  Bandage (dressing) changes and removal. GET HELP IF:  Your symptoms get worse or do not improve.  You have a fever or chills.  You have redness spreading from the affected area.  You have more fluid, blood, or pus coming from the affected area.  Your finger or knuckle is swollen or is hard to move.   This information is not intended to replace advice given to you by your health care provider. Make sure you discuss any questions you have with your health care provider.   Document Released: 08/05/2009 Document Revised: 01/01/2015 Document Reviewed: 07/25/2014 Elsevier Interactive Patient Education 2016  [employer]  Cutaneous Candidiasis Cutaneous candidiasis is a condition in which there is an overgrowth of yeast (candida) on the skin. Yeast normally live on the skin, but in small enough numbers not to cause any symptoms. In certain cases, increased growth of the yeast may cause an actual yeast infection. This kind of infection usually occurs in areas of the skin that are constantly warm and moist, such as the armpits or the groin. Yeast is the most common cause of diaper  rash in babies and in people who cannot control their bowel movements (incontinence). CAUSES  The fungus that most often causes cutaneous candidiasis is Candida albicans. Conditions that can increase the risk of getting a yeast infection of the skin include:  Obesity.  Pregnancy.  Diabetes.  Taking antibiotic medicine.  Taking birth control pills.  Taking steroid medicines.  Thyroid disease.  An iron or zinc deficiency.  Problems with the immune system. SYMPTOMS   Red, swollen area of the skin.  Bumps on the skin.  Itchiness. DIAGNOSIS  The diagnosis of cutaneous candidiasis is usually based on its appearance. Light scrapings of the skin may also be taken and viewed under a microscope to identify the presence of yeast. TREATMENT  Antifungal creams may be applied to the infected skin. In severe cases, oral medicines may be needed.  HOME CARE INSTRUCTIONS   Keep your skin clean and dry.  Maintain a healthy weight.  If you have diabetes, keep your blood sugar under control. SEEK IMMEDIATE MEDICAL CARE IF:  Your rash continues to spread despite treatment.  You have a fever, chills, or abdominal pain.   This information is not intended to replace advice given to you by your health care provider. Make sure you discuss any questions you have with your health care provider.   Document Released: 05/05/2011 Document Revised: 11/09/2011 Document Reviewed: 02/18/2015 Elsevier Interactive Patient Education [employer]  Take the antibiotic as directed. Apply warm compresses to the inflammed areas of your armpits and vagina. Consider warm, shallow tub soaks to promote healing. Follow-up with Dr. Metta Clines for treatment of any ripe abscesses with office incision & drainage procedure.

## 2015-08-27 NOTE — ED Provider Notes (Signed)
[facility] Emergency Department Provider Note ____________________________________________  Time seen: 1705  I have reviewed the triage vital signs and the nursing notes.  HISTORY  Chief Complaint  Abscess  HPI Cynthia Jones is a 74 y.o. female ports to the ED for evaluation of multiple small abscesses to her armpits and vulva. She's been treated by her primary provider, Dr. Donaciano Eva with antibiotics over the last month. She's also had a prescription for Bactroban to apply to her nostrils. She denies any of the lesions have been lanced or drain. She has not been applying warm compresses to the area. She denies any interim fever, chills, or sweats.  Past Medical History  Diagnosis Date  . Osteopenia   . Depression   . Anxiety   . Hypertension   . RAD (reactive airway disease)   . Degenerative joint disease of knee   . Allergy   . Obesity   . Fibromyalgia     Patient Active Problem List   Diagnosis Date Noted  . Folliculitis 08/05/2015  . Sinusitis 08/05/2015  . Total knee replacement status 04/17/2015  . Fibromyalgia 04/09/2015  . Bilateral occipital neuralgia 04/09/2015  . Greater trochanteric bursitis 04/09/2015  . DJD (degenerative joint disease) of knee 04/09/2015  . Sacroiliac joint disease 04/09/2015  . Facet syndrome, lumbar 04/09/2015  . Neuropathy due to secondary diabetes (HCC) 04/09/2015  . Osteopenia 03/06/2015  . Acute anxiety 03/06/2015  . Depression 03/06/2015  . Hypertension 03/06/2015  . Diabetes mellitus (HCC) 03/06/2015  . Degenerative joint disease of knee 03/06/2015  . Osteoarthritis 03/06/2015  . Seasonal allergic rhinitis 03/06/2015  . Obesity 03/06/2015  . Intertrigo 03/06/2015    Past Surgical History  Procedure Laterality Date  . Parotidectomy Left   . Appendectomy    . Cholecystectomy    . Abdominal hysterectomy    . Tonsillectomy    . Joint replacement  2014    right knee  . Eye surgery      Current  Outpatient Rx  Name  Route  Sig  Dispense  Refill  . azithromycin (ZITHROMAX) 250 MG tablet      2 now then 1 a day   6 tablet   0   . betamethasone dipropionate (DIPROLENE) 0.05 % cream      APPLY TO AFFECTED AREA TWICE A DAY NOT FOR MORE THAN 2 WEEKS   30 g   0   . carvedilol (COREG) 12.5 MG tablet   Oral   Take 1 tablet (12.5 mg total) by mouth 2 (two) times daily with a meal.   180 tablet   4   . DULoxetine (CYMBALTA) 60 MG capsule   Oral   Take 1 capsule (60 mg total) by mouth daily.   90 capsule   4   . fluconazole (DIFLUCAN) 100 MG tablet   Oral   Take 1 tablet (100 mg total) by mouth daily. Patient not taking: Reported on 08/15/2015   14 tablet   0   . losartan (COZAAR) 100 MG tablet      TAKE 1 TABLET BY MOUTH ONCE A DAY   90 tablet   0   . mupirocin ointment (BACTROBAN) 2 %   Nasal   Place 1 application into the nose 2 (two) times daily.   22 g   0   . omeprazole (PRILOSEC) 20 MG capsule   Oral   Take 1 capsule (20 mg total) by mouth daily.   90 capsule   4   .  oxyCODONE (OXY IR/ROXICODONE) 5 MG immediate release tablet      Limit 1 tab by mouth per day or twice per day if tolerated   60 tablet   0   . sulfamethoxazole-trimethoprim (BACTRIM DS,SEPTRA DS) 800-160 MG tablet   Oral   Take 2 tablets by mouth 2 (two) times daily.   40 tablet   0   . traMADol (ULTRAM) 50 MG tablet   Oral   Take 1 tablet (50 mg total) by mouth 2 (two) times daily.   60 tablet   3     1/2 to 1 pill AM and or PM as needed pain Not for ...     Allergies Erythromycin and Nsaids  Family History  Problem Relation Age of Onset  . Cancer Sister   . Heart disease Mother   . Stroke Mother   . Diabetes Mother   . Alzheimer's disease Father   . Parkinson's disease Father     Social History Social History  Substance Use Topics  . Smoking status: Never Smoker   . Smokeless tobacco: Never Used  . Alcohol Use: No    Review of Systems  Constitutional:  Negative for fever. Eyes: Negative for visual changes. ENT: Negative for sore throat. Cardiovascular: Negative for chest pain. Respiratory: Negative for shortness of breath. Gastrointestinal: Negative for abdominal pain, vomiting and diarrhea. Genitourinary: Negative for dysuria. Musculoskeletal: Negative for back pain. Skin: Negative for rash. Small abscesses as above. Neurological: Negative for headaches, focal weakness or numbness. ____________________________________________  PHYSICAL EXAM:  VITAL SIGNS: ED Triage Vitals  Enc Vitals Group     BP 08/27/15 1626 154/60 mmHg     Pulse Rate 08/27/15 1626 82     Resp 08/27/15 1626 20     Temp 08/27/15 1627 98.4 F (36.9 C)     Temp Source 08/27/15 1627 Oral     SpO2 08/27/15 1626 96 %     Weight 08/27/15 1626 226 lb (102.513 kg)     Height 08/27/15 1626 5' (1.524 m)     Head Cir --      Peak Flow --      Pain Score 08/27/15 1626 7     Pain Loc --      Pain Edu? --      Excl. in GC? --    Constitutional: Alert and oriented. Well appearing and in no distress. Head: Normocephalic and atraumatic.      Eyes: Conjunctivae are normal. PERRL. Normal extraocular movements      Ears: Canals clear. TMs intact bilaterally.   Nose: No congestion/rhinorrhea.   Mouth/Throat: Mucous membranes are moist.   Neck: Supple. No thyromegaly. Hematological/Lymphatic/Immunological: No cervical lymphadenopathy. Cardiovascular: Normal rate, regular rhythm.  Respiratory: Normal respiratory effort. No wheezes/rales/rhonchi. Gastrointestinal: Soft and nontender. No distention. GU: Patient with one small, palpable, tender, firm lesion to the inferior aspect of the right labia. There is no appreciable punctum, fluctuance, pointing, or spontaneous draining. Musculoskeletal: Nontender with normal range of motion in all extremities.  Neurologic:  Normal gait without ataxia. Normal speech and language. No gross focal neurologic deficits are  appreciated. Skin:  Skin is warm, dry and intact. No rash noted. Patient with 1 deep, small, palpable abscess to the left axilla. She is also with a small superficial pustule to the right axilla. She is noted to have erythematous, shiny, skin under the breasts consistent with yeast dermatitis. She has a simple paronychia to the left middle finger which is simply lanced with an  18-gauge needle. Psychiatric: Mood and affect are normal. Patient exhibits appropriate insight and judgment. ____________________________________________  INITIAL IMPRESSION / ASSESSMENT AND PLAN / ED COURSE  Patient with multiple subcutaneous abscesses to the armpits. She also is with a tender, cystic lesion to the left labia. None of these areas are fluctuant, pointing, or close to the surface and appropriate for incision and drainage as they present today. She is encouraged to start the Bactrim as directed, and apply warm compresses to the armpits. She is also encouraged to do warm sitz bath for the vulvar lesion. She'll follow with [name] for further management. ____________________________________________  FINAL CLINICAL IMPRESSION(S) / ED DIAGNOSES  Final diagnoses:  Paronychia of finger of left hand  Yeast dermatitis  Abscess of multiple sites      Lissa Hoard, PA-C 08/27/15 1905  Sharman Cheek, MD 08/27/15 2228

## 2015-08-27 NOTE — ED Notes (Signed)
Pt reports that she has abscesses that are not getting better even after taking antibiotics for several weeks.

## 2015-08-27 NOTE — ED Notes (Signed)
Pt discharged home after verbalizing understanding of discharge instructions; nad noted. 

## 2015-08-27 NOTE — ED Notes (Signed)
Pt currently being treated for several abscesses and has been taking antibiotics but not getting any better.

## 2015-09-11 ENCOUNTER — Other Ambulatory Visit: Payer: Self-pay | Admitting: Family Medicine

## 2015-09-12 ENCOUNTER — Ambulatory Visit: Payer: Medicare Other | Attending: Pain Medicine | Admitting: Pain Medicine

## 2015-09-12 ENCOUNTER — Encounter: Payer: Self-pay | Admitting: Pain Medicine

## 2015-09-12 VITALS — BP 134/50 | HR 75 | Temp 97.8°F | Resp 16 | Ht 60.0 in | Wt 228.0 lb

## 2015-09-12 DIAGNOSIS — M47816 Spondylosis without myelopathy or radiculopathy, lumbar region: Secondary | ICD-10-CM

## 2015-09-12 DIAGNOSIS — M5136 Other intervertebral disc degeneration, lumbar region: Secondary | ICD-10-CM | POA: Insufficient documentation

## 2015-09-12 DIAGNOSIS — M533 Sacrococcygeal disorders, not elsewhere classified: Secondary | ICD-10-CM

## 2015-09-12 DIAGNOSIS — M797 Fibromyalgia: Secondary | ICD-10-CM

## 2015-09-12 DIAGNOSIS — M179 Osteoarthritis of knee, unspecified: Secondary | ICD-10-CM | POA: Diagnosis not present

## 2015-09-12 DIAGNOSIS — M25562 Pain in left knee: Secondary | ICD-10-CM | POA: Diagnosis present

## 2015-09-12 DIAGNOSIS — M5416 Radiculopathy, lumbar region: Secondary | ICD-10-CM | POA: Diagnosis not present

## 2015-09-12 DIAGNOSIS — Z96651 Presence of right artificial knee joint: Secondary | ICD-10-CM

## 2015-09-12 DIAGNOSIS — M170 Bilateral primary osteoarthritis of knee: Secondary | ICD-10-CM | POA: Diagnosis not present

## 2015-09-12 DIAGNOSIS — M069 Rheumatoid arthritis, unspecified: Secondary | ICD-10-CM | POA: Diagnosis not present

## 2015-09-12 DIAGNOSIS — M79609 Pain in unspecified limb: Secondary | ICD-10-CM | POA: Diagnosis not present

## 2015-09-12 DIAGNOSIS — E1340 Other specified diabetes mellitus with diabetic neuropathy, unspecified: Secondary | ICD-10-CM

## 2015-09-12 DIAGNOSIS — M5481 Occipital neuralgia: Secondary | ICD-10-CM

## 2015-09-12 DIAGNOSIS — M25561 Pain in right knee: Secondary | ICD-10-CM | POA: Diagnosis present

## 2015-09-12 DIAGNOSIS — M7060 Trochanteric bursitis, unspecified hip: Secondary | ICD-10-CM

## 2015-09-12 DIAGNOSIS — M19041 Primary osteoarthritis, right hand: Secondary | ICD-10-CM | POA: Diagnosis not present

## 2015-09-12 DIAGNOSIS — M47817 Spondylosis without myelopathy or radiculopathy, lumbosacral region: Secondary | ICD-10-CM | POA: Diagnosis not present

## 2015-09-12 DIAGNOSIS — M19042 Primary osteoarthritis, left hand: Secondary | ICD-10-CM | POA: Insufficient documentation

## 2015-09-12 MED ORDER — OXYCODONE HCL 5 MG PO TABS: ORAL_TABLET | ORAL | Status: DC

## 2015-09-12 NOTE — Progress Notes (Signed)
Safety precautions to be maintained throughout the outpatient stay will include: orient to surroundings, keep bed in low position, maintain call bell within reach at all times, provide assistance with transfer out of bed and ambulation.  

## 2015-09-12 NOTE — Patient Instructions (Addendum)
PLAN   Continue present medication oxycodone  ( NO TRAMADOL )  F/U PCP Dr. Crissman for evaliation of  BP and general medical  condition  F/U surgical evaluation. May consider pending follow-up evaluations  F/U neurological evaluation. May consider pending follow-up evaluations  May consider radiofrequency rhizolysis or intraspinal procedures pending response to present treatment and F/U evaluation   Patient to call [facility] should patient have concerns prior to scheduled return appointment 

## 2015-09-12 NOTE — Progress Notes (Signed)
   Subjective:    Patient ID: Cynthia Jones, female    DOB: 1940-12-18, 75 y.o.   MRN: [id number]  HPI  The patient is a 75 year old female who returns to pain management for follow-up evaluation and treatment of pain involving the knees especially. The patient admits to pain appeared to be aggravated by the cold damp weather. We discussed patient's condition consider patient for interventional treatment as well as additional modifications of treatment regimen. We also discussed patient's use of trauma bowel syndrome patient's urine drug screen. The patient has occasionally taken tramadol for severe exacerbations of pain. Caution patient regarding this type of behavior and informed patient that she is to avoid obtaining medications from other physicians for treatment of her pain. The patient was understanding and in agreement with suggested treatment plan         Review of Systems     Objective:   Physical Exam  There was tenderness of the splenius capitis and occipitalis musculature regions of mild degree to moderate degree there was tenderness of the acromioclavicular and glenohumeral joint regions of moderate degree. Patient was able to perform drop test with moderate difficulty. Tinel and Phalen's maneuver were without increased pain of significant degree and patient was with slightly decreased grip strength. There was unremarkable Spurling's maneuver. There was tenderness of the thoracic region with no crepitus of the thoracic region noted with evidence of muscle spasms of the mid upper and lower thoracic regions. Palpation over the lumbar paraspinal muscles lumbar facet region was attends to palpation of mild to moderate degree lateral bending rotation extension and palpation of lumbar facets reproduced moderate discomfort. Straight leg raising was tolerates approximately 20 without increased pain with dorsiflexion noted. There was negative clonus negative Homans. The knees were  tenderness to palpation with negative anterior and posterior drawer signs. Crepitus was noted in the region of the knee. No sensory deficit or dermatomal distribution was detected. There was negative clonus negative Homans. Abdomen was nontender with no costovertebral tenderness noted      Assessment & Plan:    Degenerative disc disease lumbar spine  Lumbar facet syndrome  Fibromyalgia  Osteoarthritis  Degenerative joint disease of knees and hands  Rheumatoid arthritis     PLAN   Continue present medication oxycodone NO TRAMADOL  F/U PCP [name] for evaliation of  BP and general medical  condition  F/U surgical evaluation. May consider pending follow-up evaluations  F/U neurological evaluation. May consider pending follow-up evaluations  May consider radiofrequency rhizolysis or intraspinal procedures pending response to present treatment and F/U evaluation   Patient to call [facility] Management Center should patient have concerns prior to scheduled return appointment.

## 2015-09-17 ENCOUNTER — Ambulatory Visit (INDEPENDENT_AMBULATORY_CARE_PROVIDER_SITE_OTHER): Payer: Medicare Other | Admitting: Unknown Physician Specialty

## 2015-09-17 ENCOUNTER — Encounter: Payer: Self-pay | Admitting: Unknown Physician Specialty

## 2015-09-17 VITALS — BP 136/73 | HR 66 | Temp 98.4°F | Ht 59.6 in | Wt 233.6 lb

## 2015-09-17 DIAGNOSIS — G4700 Insomnia, unspecified: Secondary | ICD-10-CM | POA: Diagnosis not present

## 2015-09-17 DIAGNOSIS — B372 Candidiasis of skin and nail: Secondary | ICD-10-CM | POA: Diagnosis not present

## 2015-09-17 MED ORDER — NYSTATIN 100000 UNIT/GM EX OINT: 1.0000 "application " | TOPICAL_OINTMENT | Freq: Two times a day (BID) | CUTANEOUS | Status: DC

## 2015-09-17 MED ORDER — FLUCONAZOLE 100 MG PO TABS: 100.0000 mg | ORAL_TABLET | Freq: Every day | ORAL | Status: DC

## 2015-09-17 NOTE — Patient Instructions (Addendum)
Try Melatonin.  Take 1 mg one hour before bed.    Insomnia Insomnia is a sleep disorder that makes it difficult to fall asleep or to stay asleep. Insomnia can cause tiredness (fatigue), low energy, difficulty concentrating, mood swings, and poor performance at work or school.  There are three different ways to classify insomnia:  Difficulty falling asleep.  Difficulty staying asleep.  Waking up too early in the morning. Any type of insomnia can be long-term (chronic) or short-term (acute). Both are common. Short-term insomnia usually lasts for three months or less. Chronic insomnia occurs at least three times a week for longer than three months. CAUSES  Insomnia may be caused by another condition, situation, or substance, such as:  Anxiety.  Certain medicines.  Gastroesophageal reflux disease (GERD) or other gastrointestinal conditions.  Asthma or other breathing conditions.  Restless legs syndrome, sleep apnea, or other sleep disorders.  Chronic pain.  Menopause. This may include hot flashes.  Stroke.  Abuse of alcohol, tobacco, or illegal drugs.  Depression.  Caffeine.   Neurological disorders, such as Alzheimer disease.  An overactive thyroid (hyperthyroidism). The cause of insomnia may not be known. RISK FACTORS Risk factors for insomnia include:  Gender. Women are more commonly affected than men.  Age. Insomnia is more common as you get older.  Stress. This may involve your professional or personal life.  Income. Insomnia is more common in people with lower income.  Lack of exercise.   Irregular work schedule or night shifts.  Traveling between different time zones. SIGNS AND SYMPTOMS If you have insomnia, trouble falling asleep or trouble staying asleep is the main symptom. This may lead to other symptoms, such as:  Feeling fatigued.  Feeling nervous about going to sleep.  Not feeling rested in the morning.  Having trouble  concentrating.  Feeling irritable, anxious, or depressed. TREATMENT  Treatment for insomnia depends on the cause. If your insomnia is caused by an underlying condition, treatment will focus on addressing the condition. Treatment may also include:   Medicines to help you sleep.  Counseling or therapy.  Lifestyle adjustments. HOME CARE INSTRUCTIONS   Take medicines only as directed by your health care provider.  Keep regular sleeping and waking hours. Avoid naps.  Keep a sleep diary to help you and your health care provider figure out what could be causing your insomnia. Include:   When you sleep.  When you wake up during the night.  How well you sleep.   How rested you feel the next day.  Any side effects of medicines you are taking.  What you eat and drink.   Make your bedroom a comfortable place where it is easy to fall asleep:  Put up shades or special blackout curtains to block light from outside.  Use a white noise machine to block noise.  Keep the temperature cool.   Exercise regularly as directed by your health care provider. Avoid exercising right before bedtime.  Use relaxation techniques to manage stress. Ask your health care provider to suggest some techniques that may work well for you. These may include:  Breathing exercises.  Routines to release muscle tension.  Visualizing peaceful scenes.  Cut back on alcohol, caffeinated beverages, and cigarettes, especially close to bedtime. These can disrupt your sleep.  Do not overeat or eat spicy foods right before bedtime. This can lead to digestive discomfort that can make it hard for you to sleep.  Limit screen use before bedtime. This includes:  Watching TV.  Using your smartphone, tablet, and computer.  Stick to a routine. This can help you fall asleep faster. Try to do a quiet activity, brush your teeth, and go to bed at the same time each night.  Get out of bed if you are still awake after 15  minutes of trying to sleep. Keep the lights down, but try reading or doing a quiet activity. When you feel sleepy, go back to bed.  Make sure that you drive carefully. Avoid driving if you feel very sleepy.  Keep all follow-up appointments as directed by your health care provider. This is important. SEEK MEDICAL CARE IF:   You are tired throughout the day or have trouble in your daily routine due to sleepiness.  You continue to have sleep problems or your sleep problems get worse. SEEK IMMEDIATE MEDICAL CARE IF:   You have serious thoughts about hurting yourself or someone else.   This information is not intended to replace advice given to you by your health care provider. Make sure you discuss any questions you have with your health care provider.   Document Released: 08/14/2000 Document Revised: 05/08/2015 Document Reviewed: 05/18/2014 Elsevier Interactive Patient Education [employer].

## 2015-09-17 NOTE — Assessment & Plan Note (Signed)
Pt ed.  Discussed Melatonin and sleep hygeine

## 2015-09-17 NOTE — Progress Notes (Signed)
-----------    BP 136/73 mmHg  Pulse 66  Temp(Src) 98.4 F (36.9 C)  Ht 4' 11.6" (1.514 m)  Wt 233 lb 9.6 oz (105.96 kg)  BMI 46.23 kg/m2  SpO2 96%   Subjective:    Patient ID: Cynthia Jones, female    DOB: [birth year], 75 y.o.   MRN: [id number]  HPI: Cynthia Jones is a 75 y.o. female  Chief Complaint  Patient presents with  . Rash    pt states she has a rash all over her body for a while now, tried using gold bond but states nothing helps   Pt states she has a painful rash under her breasts and beneath pannus.  She has been using Diprolene or Bactroban (not quite clear) which has not been helping.    Insomnia Trouble sleeping since she lost her husband.  States she has trouble getting to sleep.    Relevant past medical, surgical, family and social history reviewed and updated as indicated. Interim medical history since our last visit reviewed. Allergies and medications reviewed and updated.  Review of Systems  Per HPI unless specifically indicated above     Objective:    BP 136/73 mmHg  Pulse 66  Temp(Src) 98.4 F (36.9 C)  Ht 4' 11.6" (1.514 m)  Wt 233 lb 9.6 oz (105.96 kg)  BMI 46.23 kg/m2  SpO2 96%  Wt Readings from Last 3 Encounters:  09/17/15 233 lb 9.6 oz (105.96 kg)  09/12/15 228 lb (103.42 kg)  08/27/15 226 lb (102.513 kg)    Physical Exam  Constitutional: She is oriented to person, place, and time. She appears well-developed and well-nourished. No distress.  HENT:  Head: Normocephalic and atraumatic.  Eyes: Conjunctivae and lids are normal. Right eye exhibits no discharge. Left eye exhibits no discharge. No scleral icterus.  Cardiovascular: Normal rate.   Pulmonary/Chest: Effort normal.  Abdominal: Normal appearance. There is no splenomegaly or hepatomegaly.  Musculoskeletal: Normal range of motion.  Neurological: She is alert and oriented to person, place, and time.  Skin: Skin is intact. There is erythema.  Erythemetous rash beneath  breasts and below pannus.    Psychiatric: She has a normal mood and affect. Her behavior is normal. Judgment and thought content normal.    Results for orders placed or performed in visit on 06/11/15  Bayer DCA Hb A1c Waived  Result Value Ref Range   Bayer DCA Hb A1c Waived 6.2 <7.0 %      Assessment & Plan:   Problem List Items Addressed This Visit      Unprioritized   Insomnia - Primary    Pt ed.  Discussed Melatonin and sleep hygeine       Other Visit Diagnoses    Skin yeast infection        Nystatin cream to use 3 times a day.  Rx Diflucan orally    Relevant Medications    fluconazole (DIFLUCAN) 100 MG tablet    nystatin ointment (MYCOSTATIN)           Follow up plan: Return if symptoms worsen or fail to improve.

## 2015-09-18 ENCOUNTER — Ambulatory Visit: Payer: PPO | Admitting: Unknown Physician Specialty

## 2015-10-02 ENCOUNTER — Ambulatory Visit: Payer: PPO | Admitting: Family Medicine

## 2015-10-07 ENCOUNTER — Ambulatory Visit (INDEPENDENT_AMBULATORY_CARE_PROVIDER_SITE_OTHER): Payer: Medicare Other | Admitting: Family Medicine

## 2015-10-07 ENCOUNTER — Encounter: Payer: Self-pay | Admitting: Family Medicine

## 2015-10-07 VITALS — BP 128/74 | HR 71 | Temp 97.6°F | Ht 59.3 in | Wt 231.0 lb

## 2015-10-07 DIAGNOSIS — I10 Essential (primary) hypertension: Secondary | ICD-10-CM | POA: Diagnosis not present

## 2015-10-07 DIAGNOSIS — E119 Type 2 diabetes mellitus without complications: Secondary | ICD-10-CM | POA: Diagnosis not present

## 2015-10-07 DIAGNOSIS — E118 Type 2 diabetes mellitus with unspecified complications: Secondary | ICD-10-CM

## 2015-10-07 LAB — LP+ALT+AST PICCOLO, WAIVED
ALT (SGPT) Piccolo, Waived: 15 U/L (ref 10–47)
AST (SGOT) PICCOLO, WAIVED: 22 U/L (ref 11–38)
CHOLESTEROL PICCOLO, WAIVED: 181 mg/dL (ref <200)
Chol/HDL Ratio Piccolo,Waive: 2.8 mg/dL
HDL CHOL PICCOLO, WAIVED: 64 mg/dL (ref >59)
LDL Chol Calc Piccolo Waived: 90 mg/dL (ref <100)
Triglycerides Piccolo,Waived: 136 mg/dL (ref <150)
VLDL Chol Calc Piccolo,Waive: 27 mg/dL (ref <30)

## 2015-10-07 LAB — BAYER DCA HB A1C WAIVED: HB A1C (BAYER DCA - WAIVED): 6.4 % (ref <7.0)

## 2015-10-07 NOTE — Assessment & Plan Note (Signed)
The current medical regimen is effective;  continue present plan and medications.  

## 2015-10-07 NOTE — Progress Notes (Signed)
   BP 128/74 mmHg  Pulse 71  Temp(Src) 97.6 F (36.4 C)  Ht 4' 11.3" (1.506 m)  Wt 231 lb (104.781 kg)  BMI 46.20 kg/m2  SpO2 99%   Subjective:    Patient ID: Cynthia Jones, female    DOB: 30-Aug-1941, 75 y.o.   MRN: [id number]  HPI: Cynthia Jones is a 75 y.o. female  Chief Complaint  Patient presents with  . Diabetes  . Hyperlipidemia   Patient doing well from nurse point review doing stable and Cymbalta which is helping nerves and helps some with her pain which is been followed by Dr. Metta Clines Blood pressure good control no issues with medications Reflux stable Blood sugar diet controlled and seems to be doing well.  Relevant past medical, surgical, family and social history reviewed and updated as indicated. Interim medical history since our last visit reviewed. Allergies and medications reviewed and updated.  Review of Systems  Constitutional: Negative.   Respiratory: Negative.   Cardiovascular: Negative.     Per HPI unless specifically indicated above     Objective:    BP 128/74 mmHg  Pulse 71  Temp(Src) 97.6 F (36.4 C)  Ht 4' 11.3" (1.506 m)  Wt 231 lb (104.781 kg)  BMI 46.20 kg/m2  SpO2 99%  Wt Readings from Last 3 Encounters:  10/07/15 231 lb (104.781 kg)  09/17/15 233 lb 9.6 oz (105.96 kg)  09/12/15 228 lb (103.42 kg)    Physical Exam  Constitutional: She is oriented to person, place, and time. She appears well-developed and well-nourished. No distress.  HENT:  Head: Normocephalic and atraumatic.  Right Ear: Hearing normal.  Left Ear: Hearing normal.  Nose: Nose normal.  Eyes: Conjunctivae and lids are normal. Right eye exhibits no discharge. Left eye exhibits no discharge. No scleral icterus.  Cardiovascular: Normal rate, regular rhythm and normal heart sounds.   Pulmonary/Chest: Effort normal and breath sounds normal. No respiratory distress.  Musculoskeletal: Normal range of motion.  Neurological: She is alert and oriented to person,  place, and time.  Skin: Skin is intact. No rash noted.  Psychiatric: She has a normal mood and affect. Her speech is normal and behavior is normal. Judgment and thought content normal. Cognition and memory are normal.    Results for orders placed or performed in visit on 06/11/15  Bayer DCA Hb A1c Waived  Result Value Ref Range   Bayer DCA Hb A1c Waived 6.2 <7.0 %      Assessment & Plan:   Problem List Items Addressed This Visit      Cardiovascular and Mediastinum   Essential hypertension - Primary    The current medical regimen is effective;  continue present plan and medications.       Relevant Orders   LP+ALT+AST Piccolo, Waived   Bayer DCA Hb A1c Waived   Basic metabolic panel     Endocrine   Diabetes mellitus (HCC)    The current medical regimen is effective;  continue present plan and medications.        Other Visit Diagnoses    Diabetes mellitus without complication (HCC)        Relevant Orders    LP+ALT+AST Piccolo, Waived    Bayer DCA Hb A1c Waived    Basic metabolic panel        Follow up plan: Return in about 6 months (around 04/05/2016) for Physical Exam a1c.

## 2015-10-08 ENCOUNTER — Encounter: Payer: Self-pay | Admitting: Family Medicine

## 2015-10-08 LAB — BASIC METABOLIC PANEL
BUN/Creatinine Ratio: 14 (ref 11–26)
BUN: 11 mg/dL (ref 8–27)
CALCIUM: 9.1 mg/dL (ref 8.7–10.3)
CHLORIDE: 100 mmol/L (ref 96–106)
CO2: 23 mmol/L (ref 18–29)
Creatinine, Ser: 0.81 mg/dL (ref 0.57–1.00)
GFR calc Af Amer: 84 mL/min/{1.73_m2} (ref >59)
GFR calc non Af Amer: 73 mL/min/{1.73_m2} (ref >59)
Glucose: 222 mg/dL — ABNORMAL HIGH (ref 65–99)
POTASSIUM: 4.2 mmol/L (ref 3.5–5.2)
SODIUM: 140 mmol/L (ref 134–144)

## 2015-10-09 ENCOUNTER — Encounter: Payer: Self-pay | Admitting: Pain Medicine

## 2015-10-09 ENCOUNTER — Ambulatory Visit: Payer: Medicare Other | Attending: Pain Medicine | Admitting: Pain Medicine

## 2015-10-09 VITALS — BP 149/53 | HR 68 | Temp 97.4°F | Resp 16 | Ht 60.0 in | Wt 223.0 lb

## 2015-10-09 DIAGNOSIS — M069 Rheumatoid arthritis, unspecified: Secondary | ICD-10-CM | POA: Diagnosis not present

## 2015-10-09 DIAGNOSIS — M19042 Primary osteoarthritis, left hand: Secondary | ICD-10-CM | POA: Insufficient documentation

## 2015-10-09 DIAGNOSIS — M4806 Spinal stenosis, lumbar region: Secondary | ICD-10-CM | POA: Diagnosis not present

## 2015-10-09 DIAGNOSIS — M533 Sacrococcygeal disorders, not elsewhere classified: Secondary | ICD-10-CM

## 2015-10-09 DIAGNOSIS — M170 Bilateral primary osteoarthritis of knee: Secondary | ICD-10-CM | POA: Diagnosis not present

## 2015-10-09 DIAGNOSIS — M19041 Primary osteoarthritis, right hand: Secondary | ICD-10-CM | POA: Diagnosis not present

## 2015-10-09 DIAGNOSIS — M5126 Other intervertebral disc displacement, lumbar region: Secondary | ICD-10-CM | POA: Diagnosis not present

## 2015-10-09 DIAGNOSIS — M1990 Unspecified osteoarthritis, unspecified site: Secondary | ICD-10-CM | POA: Diagnosis not present

## 2015-10-09 DIAGNOSIS — M179 Osteoarthritis of knee, unspecified: Secondary | ICD-10-CM | POA: Diagnosis not present

## 2015-10-09 DIAGNOSIS — M47817 Spondylosis without myelopathy or radiculopathy, lumbosacral region: Secondary | ICD-10-CM | POA: Diagnosis not present

## 2015-10-09 DIAGNOSIS — M79609 Pain in unspecified limb: Secondary | ICD-10-CM | POA: Diagnosis not present

## 2015-10-09 DIAGNOSIS — E1340 Other specified diabetes mellitus with diabetic neuropathy, unspecified: Secondary | ICD-10-CM

## 2015-10-09 DIAGNOSIS — M5481 Occipital neuralgia: Secondary | ICD-10-CM

## 2015-10-09 DIAGNOSIS — M545 Low back pain: Secondary | ICD-10-CM | POA: Diagnosis present

## 2015-10-09 DIAGNOSIS — M5416 Radiculopathy, lumbar region: Secondary | ICD-10-CM | POA: Diagnosis not present

## 2015-10-09 DIAGNOSIS — Z96659 Presence of unspecified artificial knee joint: Secondary | ICD-10-CM | POA: Diagnosis not present

## 2015-10-09 DIAGNOSIS — M797 Fibromyalgia: Secondary | ICD-10-CM

## 2015-10-09 DIAGNOSIS — M47816 Spondylosis without myelopathy or radiculopathy, lumbar region: Secondary | ICD-10-CM

## 2015-10-09 DIAGNOSIS — Z96651 Presence of right artificial knee joint: Secondary | ICD-10-CM

## 2015-10-09 DIAGNOSIS — M79606 Pain in leg, unspecified: Secondary | ICD-10-CM | POA: Diagnosis present

## 2015-10-09 DIAGNOSIS — M7060 Trochanteric bursitis, unspecified hip: Secondary | ICD-10-CM

## 2015-10-09 MED ORDER — OXYCODONE HCL 5 MG PO TABS: ORAL_TABLET | ORAL | Status: DC

## 2015-10-09 NOTE — Progress Notes (Signed)
   Subjective:    Patient ID: Cynthia Jones, female    DOB: [birth year], 75 y.o.   MRN: [id number]  HPI  The patient is a 75 year old female who returns to pain management for further evaluation and treatment of pain involving the lower back and lower extremity region with known degenerative changes of the lumbar spine and with prior total knee replacement. The patient states that her pain does increase with prolonged standing and walking and that she has to sit down various times during the day when the pressure of the lower back increases and begins to radiate down the lower extremities. The patient denies any trauma change in events of daily living the call significant change in symptomatology. The patient states that medication oxycodone has been helpful in terms of reducing her symptoms. The patient has had some return of pain involving the region of the knee as well. We discussed interventional treatment and will avoid interventional treatment at this time. The patient will call pain management should there be significant change in condition prior to scheduled return appointment at which time we will consider modification of treatment regimen. The patient was understanding and agreed to suggested treatment plan  Review of Systems     Objective:   Physical Exam  There was mild tenderness of the splenius capitis and occipitalis musculature region. Palpation of the cervical facet cervical paraspinal musculature he can reproduce mild discomfort. There was mild tenderness of the acromioclavicular and glenohumeral joint regions. The patient appeared to be with unremarkable Spurling's maneuver. Tinel and Phalen's maneuver were without increase of pain of significant degree. Patient appeared to be with bilaterally equal grip strength. Palpation over the thoracic facet thoracic paraspinal musculature region was attends to palpation of mild to moderate degree with moderate tenderness to palpation of the  lower thoracic paraspinal musculature region. There was no crepitus of the thoracic region noted. Palpation over the lumbar paraspinal musculatures and lumbar facet region was attends to palpation of moderate degree to moderately severe degree with lateral bending rotation extension and palpation of the lumbar facets reproducing moderate discomfort.. Straight leg raise was tolerates approximately 20 without a definite increase of pain with dorsiflexion noted. The knee was well-healed surgical scar of the knee without increased warmth and erythema in the region of the knee. EHL strength was slightly decreased. No sensory deficit of dermatomal distribution was detected. There was negative clonus negative Homans. Abdomen was nontender with no costovertebral tenderness noted      Assessment & Plan:  Degenerative disc disease lumbar spine  Lumbar stenosis with neurogenic claudication  Lumbar facet syndrome  Fibromyalgia  Osteoarthritis  Degenerative joint disease of knees and hands  Status post total knee replacement  Rheumatoid arthritis     PLAN   Continue present medication oxycodone  ( NO TRAMADOL )  F/U PCP [name] for evaliation of  BP and general medical  condition  F/U surgical evaluation. May consider pending follow-up evaluations  F/U neurological evaluation. May consider pending follow-up evaluations  May consider radiofrequency rhizolysis or intraspinal procedures pending response to present treatment and F/U evaluation   Patient to call [facility] should patient have concerns prior to scheduled return appointment.

## 2015-10-09 NOTE — Progress Notes (Signed)
Safety precautions to be maintained throughout the outpatient stay will include: orient to surroundings, keep bed in low position, maintain call bell within reach at all times, provide assistance with transfer out of bed and ambulation.  Oxycodone script in hand

## 2015-10-09 NOTE — Patient Instructions (Addendum)
PLAN   Continue present medication oxycodone  ( NO TRAMADOL )  F/U PCP Dr. Crissman for evaliation of  BP and general medical  condition  F/U surgical evaluation. May consider pending follow-up evaluations  F/U neurological evaluation. May consider pending follow-up evaluations  May consider radiofrequency rhizolysis or intraspinal procedures pending response to present treatment and F/U evaluation   Patient to call [facility] should patient have concerns prior to scheduled return appointment 

## 2015-10-29 ENCOUNTER — Other Ambulatory Visit: Payer: Self-pay | Admitting: Family Medicine

## 2015-10-30 NOTE — Telephone Encounter (Signed)
Call pt 

## 2015-11-04 ENCOUNTER — Ambulatory Visit: Payer: Medicare Other | Attending: Pain Medicine | Admitting: Pain Medicine

## 2015-11-04 ENCOUNTER — Encounter: Payer: Self-pay | Admitting: Pain Medicine

## 2015-11-04 VITALS — BP 140/59 | HR 79 | Temp 97.4°F | Resp 16 | Ht 60.0 in | Wt 228.0 lb

## 2015-11-04 DIAGNOSIS — M5481 Occipital neuralgia: Secondary | ICD-10-CM

## 2015-11-04 DIAGNOSIS — M25561 Pain in right knee: Secondary | ICD-10-CM | POA: Diagnosis present

## 2015-11-04 DIAGNOSIS — M19042 Primary osteoarthritis, left hand: Secondary | ICD-10-CM | POA: Insufficient documentation

## 2015-11-04 DIAGNOSIS — M797 Fibromyalgia: Secondary | ICD-10-CM | POA: Diagnosis not present

## 2015-11-04 DIAGNOSIS — M5136 Other intervertebral disc degeneration, lumbar region: Secondary | ICD-10-CM | POA: Insufficient documentation

## 2015-11-04 DIAGNOSIS — M4806 Spinal stenosis, lumbar region: Secondary | ICD-10-CM | POA: Diagnosis not present

## 2015-11-04 DIAGNOSIS — M5416 Radiculopathy, lumbar region: Secondary | ICD-10-CM | POA: Diagnosis not present

## 2015-11-04 DIAGNOSIS — G8929 Other chronic pain: Secondary | ICD-10-CM | POA: Diagnosis not present

## 2015-11-04 DIAGNOSIS — M19041 Primary osteoarthritis, right hand: Secondary | ICD-10-CM | POA: Diagnosis not present

## 2015-11-04 DIAGNOSIS — R51 Headache: Secondary | ICD-10-CM | POA: Insufficient documentation

## 2015-11-04 DIAGNOSIS — M170 Bilateral primary osteoarthritis of knee: Secondary | ICD-10-CM | POA: Diagnosis not present

## 2015-11-04 DIAGNOSIS — M25562 Pain in left knee: Secondary | ICD-10-CM | POA: Diagnosis present

## 2015-11-04 DIAGNOSIS — Z96659 Presence of unspecified artificial knee joint: Secondary | ICD-10-CM | POA: Insufficient documentation

## 2015-11-04 DIAGNOSIS — M1990 Unspecified osteoarthritis, unspecified site: Secondary | ICD-10-CM | POA: Diagnosis not present

## 2015-11-04 DIAGNOSIS — Z5181 Encounter for therapeutic drug level monitoring: Secondary | ICD-10-CM | POA: Diagnosis not present

## 2015-11-04 DIAGNOSIS — F1190 Opioid use, unspecified, uncomplicated: Secondary | ICD-10-CM | POA: Diagnosis not present

## 2015-11-04 DIAGNOSIS — E1340 Other specified diabetes mellitus with diabetic neuropathy, unspecified: Secondary | ICD-10-CM

## 2015-11-04 DIAGNOSIS — M545 Low back pain: Secondary | ICD-10-CM | POA: Diagnosis present

## 2015-11-04 DIAGNOSIS — Z96651 Presence of right artificial knee joint: Secondary | ICD-10-CM

## 2015-11-04 DIAGNOSIS — M7060 Trochanteric bursitis, unspecified hip: Secondary | ICD-10-CM

## 2015-11-04 DIAGNOSIS — M47817 Spondylosis without myelopathy or radiculopathy, lumbosacral region: Secondary | ICD-10-CM | POA: Diagnosis not present

## 2015-11-04 DIAGNOSIS — Z79899 Other long term (current) drug therapy: Secondary | ICD-10-CM | POA: Diagnosis not present

## 2015-11-04 DIAGNOSIS — M179 Osteoarthritis of knee, unspecified: Secondary | ICD-10-CM | POA: Diagnosis not present

## 2015-11-04 DIAGNOSIS — M79609 Pain in unspecified limb: Secondary | ICD-10-CM | POA: Diagnosis not present

## 2015-11-04 DIAGNOSIS — M47816 Spondylosis without myelopathy or radiculopathy, lumbar region: Secondary | ICD-10-CM

## 2015-11-04 DIAGNOSIS — M533 Sacrococcygeal disorders, not elsewhere classified: Secondary | ICD-10-CM

## 2015-11-04 MED ORDER — OXYCODONE HCL 5 MG PO TABS: ORAL_TABLET | ORAL | Status: DC

## 2015-11-04 NOTE — Patient Instructions (Signed)
PLAN   Continue present medication oxycodone  ( NO TRAMADOL )  F/U PCP Dr. Crissman for evaliation of  BP and general medical  condition  F/U surgical evaluation. May consider pending follow-up evaluations  F/U neurological evaluation. May consider pending follow-up evaluations  May consider radiofrequency rhizolysis or intraspinal procedures pending response to present treatment and F/U evaluation   Patient to call [facility] should patient have concerns prior to scheduled return appointment 

## 2015-11-04 NOTE — Progress Notes (Signed)
Safety precautions to be maintained throughout the outpatient stay will include: orient to surroundings, keep bed in low position, maintain call bell within reach at all times, provide assistance with transfer out of bed and ambulation.  

## 2015-11-04 NOTE — Progress Notes (Signed)
   Subjective:    Patient ID: Cynthia Jones, female    DOB: [birth year], 75 y.o.   MRN: [id number]  HPI  The patient is a 75 year old female who returns to pain management for further evaluation and treatment of pain involving the lower back lower extremity region especially the knees. The patient also admits to pain involving the neck with pain radiating from the neck to the back of the head causing headaches. The patient denied any trauma change in events of daily living the cost change in symptomatology. The patient stated that the knee pain was aggravated by activities on the feet. The patient stated that the headaches appeared to be due to spasms occurring in the region of the upper back and region of the neck. We discussed interventional treatment and we will consider patient for interventional treatment should her symptoms persist. We will continue present medication as prescribed and we'll remain available to consider modification of treatment pending follow-up evaluation. The patient was understanding and agreed to suggested treatment plan. The patient denied any trauma or change in events of daily living the call significant change in symptomatology.       Review of Systems     Objective:   Physical Exam  There was tenderness to palpation of the splenius capitis and occipitalis musculature region of mild to moderate degree with palpation over the cervical facet cervical paraspinal musculature region reproducing mild to moderate discomfort. There were no masses of the head and neck noted and no bounding pulsations of the temporal region. Palpation over the trapezius levator scapula and rhomboid musculature region reproduced pain of moderate degree. There was tenderness over the region of the thoracic facet thoracic paraspinal musculature region a moderate degree with no crepitus of the thoracic region noted. Palpation over the lumbar paraspinal muscular treat and lumbar facet region  was attends to palpation of moderate degree with lateral bending rotation extension and palpation over the lumbar facets reproducing moderate discomfort. There was mild to moderate tenderness of the PSIS and PII S region with mild tenderness along the greater trochanteric region iliotibial band region. Straight leg raise was tolerates approximately 30 without an increase of pain with dorsiflexion noted. No sensory deficit or dermatomal dystrophy detected. The patient is status post total knee replacement with tenderness to palpation of the knee noted There was negative clonus negative Homans.. The abdomen was nontender with no costovertebral angle tenderness noted          Assessment & Plan:     Degenerative disc disease lumbar spine  Lumbar stenosis with neurogenic claudication  Lumbar facet syndrome  Fibromyalgia  Osteoarthritis  Degenerative joint disease of knees and hands  Status post total knee replacement     PLAN   Continue present medication oxycodone  ( NO TRAMADOL )  F/U PCP [name] for evaliation of  BP and general medical  condition  F/U surgical evaluation. May consider pending follow-up evaluations  F/U neurological evaluation. May consider pending follow-up evaluations  May consider radiofrequency rhizolysis or intraspinal procedures pending response to present treatment and F/U evaluation   Patient to call [facility] should patient have concerns prior to scheduled return appointment.

## 2015-11-05 ENCOUNTER — Ambulatory Visit: Payer: Medicare Other | Admitting: Pain Medicine

## 2015-11-06 ENCOUNTER — Ambulatory Visit: Payer: Medicare Other | Admitting: Pain Medicine

## 2015-11-22 ENCOUNTER — Other Ambulatory Visit: Payer: Self-pay | Admitting: Pain Medicine

## 2015-12-03 ENCOUNTER — Emergency Department
Admission: EM | Admit: 2015-12-03 | Discharge: 2015-12-03 | Disposition: A | Discharge status: Home / Self Care | Payer: Medicare Other | Attending: Emergency Medicine | Admitting: Emergency Medicine

## 2015-12-03 ENCOUNTER — Ambulatory Visit: Payer: Medicare Other | Admitting: Pain Medicine

## 2015-12-03 DIAGNOSIS — I10 Essential (primary) hypertension: Secondary | ICD-10-CM | POA: Diagnosis not present

## 2015-12-03 DIAGNOSIS — M179 Osteoarthritis of knee, unspecified: Secondary | ICD-10-CM | POA: Diagnosis not present

## 2015-12-03 DIAGNOSIS — F329 Major depressive disorder, single episode, unspecified: Secondary | ICD-10-CM | POA: Insufficient documentation

## 2015-12-03 DIAGNOSIS — E1340 Other specified diabetes mellitus with diabetic neuropathy, unspecified: Secondary | ICD-10-CM | POA: Diagnosis not present

## 2015-12-03 DIAGNOSIS — L500 Allergic urticaria: Secondary | ICD-10-CM | POA: Insufficient documentation

## 2015-12-03 DIAGNOSIS — L509 Urticaria, unspecified: Secondary | ICD-10-CM | POA: Diagnosis not present

## 2015-12-03 DIAGNOSIS — E669 Obesity, unspecified: Secondary | ICD-10-CM | POA: Insufficient documentation

## 2015-12-03 DIAGNOSIS — T7840XA Allergy, unspecified, initial encounter: Secondary | ICD-10-CM | POA: Diagnosis present

## 2015-12-03 MED ORDER — DIPHENHYDRAMINE HCL 25 MG PO CAPS
ORAL_CAPSULE | ORAL | Status: AC
Start: 2015-12-03 — End: 2015-12-03
  Administered 2015-12-03: 50 mg via ORAL
  Filled 2015-12-03: qty 2

## 2015-12-03 MED ORDER — FAMOTIDINE 20 MG PO TABS
ORAL_TABLET | ORAL | Status: AC
  Administered 2015-12-03: 40 mg via ORAL
  Filled 2015-12-03: qty 2

## 2015-12-03 MED ORDER — PREDNISONE 20 MG PO TABS
40.0000 mg | ORAL_TABLET | Freq: Once | ORAL | Status: AC
  Administered 2015-12-03: 40 mg via ORAL

## 2015-12-03 MED ORDER — DIPHENHYDRAMINE HCL 25 MG PO CAPS
50.0000 mg | ORAL_CAPSULE | Freq: Once | ORAL | Status: AC
  Administered 2015-12-03: 50 mg via ORAL

## 2015-12-03 MED ORDER — FAMOTIDINE 20 MG PO TABS
40.0000 mg | ORAL_TABLET | Freq: Once | ORAL | Status: AC
  Administered 2015-12-03: 40 mg via ORAL

## 2015-12-03 MED ORDER — CYPROHEPTADINE HCL 4 MG PO TABS: 4.0000 mg | ORAL_TABLET | Freq: Three times a day (TID) | ORAL | Status: DC | PRN

## 2015-12-03 MED ORDER — PREDNISONE 20 MG PO TABS
ORAL_TABLET | ORAL | Status: AC
  Administered 2015-12-03: 40 mg via ORAL
  Filled 2015-12-03: qty 2

## 2015-12-03 MED ORDER — METHYLPREDNISOLONE 4 MG PO TBPK: ORAL_TABLET | ORAL | Status: DC

## 2015-12-03 NOTE — ED Provider Notes (Signed)
[facility] Emergency Department Provider Note  ____________________________________________  Time seen: Approximately 7:11 AM  I have reviewed the triage vital signs and the nursing notes.   HISTORY  Chief Complaint Allergic Reaction    HPI Cynthia Jones is a 75 y.o. female patient complaining with diffuse rash from the neck to her feet. Onset yesterday. Patient stated 2 days ago she noticed some itching but no rash. Patient state yesterday rash developed intense itching. Patient states she is unable to sleep last night secondary to the itching. Patient took 25 mg of Benadryl last night with no relief. Patient denies any new hygiene products or foods. Patient denies any signs of anaphylactic. Patient denies any signs of angioedema.Patient denies any pain. Patient was given given Benadryl, Pepcid, and prednisone in triage. Patient state itching has decreased status post taken the above medications.   Past Medical History  Diagnosis Date  . Osteopenia   . Depression   . Anxiety   . Hypertension   . RAD (reactive airway disease)   . Degenerative joint disease of knee   . Allergy   . Obesity   . Fibromyalgia     Patient Active Problem List   Diagnosis Date Noted  . Insomnia 09/17/2015  . Folliculitis 08/05/2015  . Sinusitis 08/05/2015  . Total knee replacement status 04/17/2015  . Fibromyalgia 04/09/2015  . Bilateral occipital neuralgia 04/09/2015  . Greater trochanteric bursitis 04/09/2015  . DJD (degenerative joint disease) of knee 04/09/2015  . Sacroiliac joint disease 04/09/2015  . Facet syndrome, lumbar 04/09/2015  . Neuropathy due to secondary diabetes (HCC) 04/09/2015  . Osteopenia 03/06/2015  . Acute anxiety 03/06/2015  . Depression 03/06/2015  . Essential hypertension 03/06/2015  . Diabetes mellitus (HCC) 03/06/2015  . Degenerative joint disease of knee 03/06/2015  . Osteoarthritis 03/06/2015  . Seasonal allergic rhinitis  03/06/2015  . Obesity 03/06/2015  . Intertrigo 03/06/2015    Past Surgical History  Procedure Laterality Date  . Parotidectomy Left   . Appendectomy    . Cholecystectomy    . Abdominal hysterectomy    . Tonsillectomy    . Joint replacement  2014    right knee  . Eye surgery      Current Outpatient Rx  Name  Route  Sig  Dispense  Refill  . carvedilol (COREG) 12.5 MG tablet   Oral   Take 1 tablet (12.5 mg total) by mouth 2 (two) times daily with a meal.   180 tablet   4   . cyproheptadine (PERIACTIN) 4 MG tablet   Oral   Take 1 tablet (4 mg total) by mouth 3 (three) times daily as needed for allergies.   30 tablet   0   . DULoxetine (CYMBALTA) 60 MG capsule   Oral   Take 1 capsule (60 mg total) by mouth daily.   90 capsule   4   . fluconazole (DIFLUCAN) 100 MG tablet   Oral   Take 1 tablet (100 mg total) by mouth daily. Patient not taking: Reported on 10/09/2015   7 tablet   1   . losartan (COZAAR) 100 MG tablet      TAKE 1 TABLET BY MOUTH ONCE A DAY   90 tablet   0   . methylPREDNISolone (MEDROL DOSEPAK) 4 MG TBPK tablet      Take Tapered dose as directed   21 tablet   0   . nystatin ointment (MYCOSTATIN)   Topical   Apply 1 application topically  2 (two) times daily. Patient not taking: Reported on 10/09/2015   30 g   1   . omeprazole (PRILOSEC) 20 MG capsule   Oral   Take 1 capsule (20 mg total) by mouth daily.   90 capsule   4   . oxyCODONE (OXY IR/ROXICODONE) 5 MG immediate release tablet      Limit 1 tab by mouth per day or twice per day if tolerated   60 tablet   0     Allergies Erythromycin and Nsaids  Family History  Problem Relation Age of Onset  . Cancer Sister   . Heart disease Mother   . Stroke Mother   . Diabetes Mother   . Alzheimer's disease Father   . Parkinson's disease Father     Social History Social History  Substance Use Topics  . Smoking status: Never Smoker   . Smokeless tobacco: Never Used  . Alcohol Use:  No    Review of Systems Constitutional: No fever/chills Eyes: No visual changes. ENT: No sore throat. Cardiovascular: Denies chest pain. Respiratory: Denies shortness of breath. Gastrointestinal: No abdominal pain.  No nausea, no vomiting.  No diarrhea.  No constipation. Genitourinary: Negative for dysuria. Musculoskeletal: Negative for back pain. Skin: Positive for rash. Neurological: Negative for headaches, focal weakness or numbness. Psychiatric:Pressure and anxiety Endocrine:Hypertension Allergic/Immunilogical: Erythromycin and NSAIDs  10-point ROS otherwise negative.  ____________________________________________   PHYSICAL EXAM:  VITAL SIGNS: ED Triage Vitals  Enc Vitals Group     BP 12/03/15 0553 181/64 mmHg     Pulse Rate 12/03/15 0553 89     Resp 12/03/15 0553 20     Temp 12/03/15 0553 98.4 F (36.9 C)     Temp Source 12/03/15 0553 Oral     SpO2 12/03/15 0553 97 %     Weight 12/03/15 0553 228 lb (103.42 kg)     Height 12/03/15 0553 5' (1.524 m)     Head Cir --      Peak Flow --      Pain Score --      Pain Loc --      Pain Edu? --      Excl. in GC? --     Constitutional: Alert and oriented. Well appearing and in no acute distress. Eyes: Conjunctivae are normal. PERRL. EOMI. Head: Atraumatic. Nose: No congestion/rhinnorhea. Mouth/Throat: Mucous membranes are moist.  Oropharynx non-erythematous. Neck: No stridor.  No cervical spine tenderness to palpation. Hematological/Lymphatic/Immunilogical: No cervical lymphadenopathy. Cardiovascular: Normal rate, regular rhythm. Grossly normal heart sounds.  Good peripheral circulation. Respiratory: Normal respiratory effort.  No retractions. Lungs CTAB. Gastrointestinal: Soft and nontender. No distention. No abdominal bruits. No CVA tenderness. Musculoskeletal: No lower extremity tenderness nor edema.  No joint effusions. Neurologic:  Normal speech and language. No gross focal neurologic deficits are appreciated.  No gait instability. Skin:  Skin is warm, dry and intact. No rash noted. Psychiatric: Mood and affect are normal. Speech and behavior are normal.  ____________________________________________   LABS (all labs ordered are listed, but only abnormal results are displayed)  Labs Reviewed - No data to display ____________________________________________  EKG   ____________________________________________  RADIOLOGY   ____________________________________________   PROCEDURES  Procedure(s) performed: None  Critical Care performed: No  ____________________________________________   INITIAL IMPRESSION / ASSESSMENT AND PLAN / ED COURSE  Pertinent labs & imaging results that were available during my care of the patient were reviewed by me and considered in my medical decision making (see chart for details).  Urticaria. Patient states she has decreased. Patient given discharge care instructions. Patient given prescription for Periactin and the Medrol Dosepak. Patient advised follow-up family doctor within one week. Return to ER for condition worsens. ____________________________________________   FINAL CLINICAL IMPRESSION(S) / ED DIAGNOSES  Final diagnoses:  Urticaria of unknown origin      Joni Reining, PA-C 12/03/15 1740  Jene Every, MD 12/03/15 [phone]

## 2015-12-03 NOTE — Discharge Instructions (Signed)
Hives Hives are itchy, red, swollen areas of the skin. They can vary in size and location on your body. Hives can come and go for hours or several days (acute hives) or for several weeks (chronic hives). Hives do not spread from person to person (noncontagious). They may get worse with scratching, exercise, and emotional stress. CAUSES   Allergic reaction to food, additives, or drugs.  Infections, including the common cold.  Illness, such as vasculitis, lupus, or thyroid disease.  Exposure to sunlight, heat, or cold.  Exercise.  Stress.  Contact with chemicals. SYMPTOMS   Red or white swollen patches on the skin. The patches may change size, shape, and location quickly and repeatedly.  Itching.  Swelling of the hands, feet, and face. This may occur if hives develop deeper in the skin. DIAGNOSIS  Your caregiver can usually tell what is wrong by performing a physical exam. Skin or blood tests may also be done to determine the cause of your hives. In some cases, the cause cannot be determined. TREATMENT  Mild cases usually get better with medicines such as antihistamines. Severe cases may require an emergency epinephrine injection. If the cause of your hives is known, treatment includes avoiding that trigger.  HOME CARE INSTRUCTIONS   Avoid causes that trigger your hives.  Take antihistamines as directed by your caregiver to reduce the severity of your hives. Non-sedating or low-sedating antihistamines are usually recommended. Do not drive while taking an antihistamine.  Take any other medicines prescribed for itching as directed by your caregiver.  Wear loose-fitting clothing.  Keep all follow-up appointments as directed by your caregiver. SEEK MEDICAL CARE IF:   You have persistent or severe itching that is not relieved with medicine.  You have painful or swollen joints. SEEK IMMEDIATE MEDICAL CARE IF:   You have a fever.  Your tongue or lips are swollen.  You have  trouble breathing or swallowing.  You feel tightness in the throat or chest.  You have abdominal pain. These problems may be the first sign of a life-threatening allergic reaction. Call your local emergency services (911 in [place] MAKE SURE YOU:   Understand these instructions.  Will watch your condition.  Will get help right away if you are not doing well or get worse.   This information is not intended to replace advice given to you by your health care provider. Make sure you discuss any questions you have with your health care provider.   Document Released: 08/17/2005 Document Revised: 08/22/2013 Document Reviewed: 11/10/2011 Elsevier Interactive Patient Education 2016 [employer]  

## 2015-12-03 NOTE — ED Notes (Signed)
Patient ambulatory to triage with steady gait, without difficulty or distress noted; pt reports onset itching rash since yesterday with no known cause; benadryl taken at 1am, 25mg 

## 2015-12-12 ENCOUNTER — Ambulatory Visit: Payer: Medicare Other | Attending: Pain Medicine | Admitting: Pain Medicine

## 2015-12-12 ENCOUNTER — Encounter: Payer: Self-pay | Admitting: Pain Medicine

## 2015-12-12 VITALS — BP 152/68 | HR 79 | Temp 98.2°F | Resp 18 | Ht 60.0 in | Wt 228.0 lb

## 2015-12-12 DIAGNOSIS — M5416 Radiculopathy, lumbar region: Secondary | ICD-10-CM | POA: Diagnosis not present

## 2015-12-12 DIAGNOSIS — M7060 Trochanteric bursitis, unspecified hip: Secondary | ICD-10-CM

## 2015-12-12 DIAGNOSIS — M19042 Primary osteoarthritis, left hand: Secondary | ICD-10-CM | POA: Diagnosis not present

## 2015-12-12 DIAGNOSIS — M797 Fibromyalgia: Secondary | ICD-10-CM | POA: Diagnosis not present

## 2015-12-12 DIAGNOSIS — M5481 Occipital neuralgia: Secondary | ICD-10-CM | POA: Diagnosis not present

## 2015-12-12 DIAGNOSIS — M5136 Other intervertebral disc degeneration, lumbar region: Secondary | ICD-10-CM | POA: Diagnosis not present

## 2015-12-12 DIAGNOSIS — M79609 Pain in unspecified limb: Secondary | ICD-10-CM | POA: Diagnosis not present

## 2015-12-12 DIAGNOSIS — M4806 Spinal stenosis, lumbar region: Secondary | ICD-10-CM | POA: Insufficient documentation

## 2015-12-12 DIAGNOSIS — M179 Osteoarthritis of knee, unspecified: Secondary | ICD-10-CM | POA: Diagnosis not present

## 2015-12-12 DIAGNOSIS — Z96651 Presence of right artificial knee joint: Secondary | ICD-10-CM

## 2015-12-12 DIAGNOSIS — M170 Bilateral primary osteoarthritis of knee: Secondary | ICD-10-CM | POA: Diagnosis not present

## 2015-12-12 DIAGNOSIS — M19041 Primary osteoarthritis, right hand: Secondary | ICD-10-CM | POA: Insufficient documentation

## 2015-12-12 DIAGNOSIS — M1990 Unspecified osteoarthritis, unspecified site: Secondary | ICD-10-CM | POA: Diagnosis not present

## 2015-12-12 DIAGNOSIS — R51 Headache: Secondary | ICD-10-CM | POA: Insufficient documentation

## 2015-12-12 DIAGNOSIS — Z96659 Presence of unspecified artificial knee joint: Secondary | ICD-10-CM | POA: Insufficient documentation

## 2015-12-12 DIAGNOSIS — M542 Cervicalgia: Secondary | ICD-10-CM | POA: Diagnosis present

## 2015-12-12 DIAGNOSIS — M546 Pain in thoracic spine: Secondary | ICD-10-CM | POA: Diagnosis present

## 2015-12-12 DIAGNOSIS — M47817 Spondylosis without myelopathy or radiculopathy, lumbosacral region: Secondary | ICD-10-CM | POA: Diagnosis not present

## 2015-12-12 DIAGNOSIS — M47816 Spondylosis without myelopathy or radiculopathy, lumbar region: Secondary | ICD-10-CM

## 2015-12-12 DIAGNOSIS — M533 Sacrococcygeal disorders, not elsewhere classified: Secondary | ICD-10-CM

## 2015-12-12 DIAGNOSIS — E1340 Other specified diabetes mellitus with diabetic neuropathy, unspecified: Secondary | ICD-10-CM

## 2015-12-12 MED ORDER — OXYCODONE HCL 5 MG PO TABS: ORAL_TABLET | ORAL | Status: DC

## 2015-12-12 NOTE — Progress Notes (Signed)
   Subjective:    Patient ID: Cynthia Jones, female    DOB: 03-09-1941, 75 y.o.   MRN: [id number]  HPI The patient is a 75 year old female who returns to pain management for further evaluation and treatment of pain involving the neck entire back upper and lower extremity regions. The patient states she has significant pain occurring in the back of the neck radiating to the back of the head. The patient denies any trauma change in events of daily living the call significant change in symptomatology. The patient also states she has lower back lower extremity pain and some pain involving the knee. The patient states the most bothersome pain is the headache. We discussed patient's condition and will proceed with greater occipital nerve block at time return appointment in attempt to decrease severity of headaches, minimize progression of symptoms, and avoid need for more involved treatment. The patient agreed to suggested treatment plan. The patient will continue oxycodone as prescribed this time. All agreed to suggested treatment plan.   Review of Systems     Objective:   Physical Exam   Palpation of the splenius capitis and a separate talus muscles regions reproduced moderately severe discomfort. There was tenderness over the cervical facet cervical paraspinal musculature region a moderate degree as well palpation thoracic facet thoracic paraspinal musculature region was attends to palpation of moderate degree with no crepitus of the thoracic region noted. There were no bounding pulsations of the temporal region noted. Palpation in the region of the temporomandibular joint region associated with mild discomfort. There was no excessive tends to palpation over the sinuses noted. Patient over the lumbar paraspinal musculatures and lumbar facet region was with moderate discomfort with lateral bending rotation extension and palpation of the lumbar facets reproducing moderate discomfort. There was  tenderness over the PSIS and PII S region a moderate degree with mild tenderness of the greater trochanteric region iliotibial band region. Straight leg raising was tolerates approximately 30 without increased pain with dorsiflexion noted. Knee was attends to palpation without increased warmth and erythema in the region of the knee. There was tenderness over the left and right knees with crepitus of the knees noted. There was negative anterior and posterior drawer signs without ballottement of the patella. There was negative clonus negative Homans. Abdomen nontender and no costovertebral tenderness noted    Assessment & Plan:      Bilateral occipital neuralgia  Degenerative disc disease lumbar spine  Lumbar stenosis with neurogenic claudication  Lumbar facet syndrome  Fibromyalgia  Osteoarthritis  Degenerative joint disease of knees and hands  Status post total knee replacement       PLAN   Continue present medication oxycodone  ( NO TRAMADOL )  Greater occipital nerve blocks to be performed at time of return appointment  F/U PCP [name] for evaliation of  BP and general medical  condition  F/U surgical evaluation. May consider pending follow-up evaluations  F/U neurological evaluation. May consider pending follow-up evaluations  May consider radiofrequency rhizolysis or intraspinal procedures pending response to present treatment and F/U evaluation   Patient to call Pain Management Center should patient have concerns prior to scheduled return appointment.

## 2015-12-12 NOTE — Progress Notes (Signed)
Safety precautions to be maintained throughout the outpatient stay will include: orient to surroundings, keep bed in low position, maintain call bell within reach at all times, provide assistance with transfer out of bed and ambulation.  

## 2015-12-12 NOTE — Patient Instructions (Addendum)
PLAN   Continue present medication oxycodone  ( NO TRAMADOL )  Greater occipital nerve blocks to be performed at time of return appointment  F/U PCP [name] for evaliation of  BP and general medical  condition  F/U surgical evaluation. May consider pending follow-up evaluations  F/U neurological evaluation. May consider pending follow-up evaluations  May consider radiofrequency rhizolysis or intraspinal procedures pending response to present treatment and F/U evaluation   Patient to call [facility] Management Center should patient have concerns prior to scheduled return appointment.Occipital Nerve Block Patient Information  Description: The occipital nerves originate in the cervical (neck) spinal cord and travel upward through muscle and tissue to supply sensation to the back of the head and top of the scalp.  In addition, the nerves control some of the muscles of the scalp.  Occipital neuralgia is an irritation of these nerves which can cause headaches, numbness of the scalp, and neck discomfort.     The occipital nerve block will interrupt nerve transmission through these nerves and can relieve pain and spasm.  The block consists of insertion of a small needle under the skin in the back of the head to deposit local anesthetic (numbing medicine) and/or steroids around the nerve.  The entire block usually lasts less than 5 minutes.  Conditions which may be treated by occipital blocks:   Muscular pain and spasm of the scalp  Nerve irritation, back of the head  Headaches  Upper neck pain  Preparation for the injection:  1. Do not eat any solid food or dairy products within 8 hours of your appointment. 2. You may drink clear liquids up to 3 hours before appointment.  Clear liquids include water, black coffee, juice or soda.  No milk or cream please. 3. You may take your regular medication, including pain medications, with a sip of water before you appointment.  Diabetics should hold  regular insulin (if taken separately) and take 1/2 normal NPH dose the morning of the procedure.  Carry some sugar containing items with you to your appointment. 4. A driver must accompany you and be prepared to drive you home after your procedure. 5. Bring all your current medications with you. 6. An IV may be inserted and sedation may be given at the discretion of the physician. 7. A blood pressure cuff, EKG, and other monitors will often be applied during the procedure.  Some patients may need to have extra oxygen administered for a short period. 8. You will be asked to provide medical information, including your allergies and medications, prior to the procedure.  We must know immediately if you are taking blood thinners (like Coumadin/Warfarin) or if you are allergic to IV iodine contrast (dye).  We must know if you could possible be pregnant.  9. Do not wear a high collared shirt or turtleneck.  Tie long hair up in the back if possible.  Possible side-effects:   Bleeding from needle site  Infection (rare, may require surgery)  Nerve injury (rare)  Hair on back of neck can be tinged with iodine scrub (this will wash out)  Light-headedness (temporary)  Pain at injection site (several days)  Decreased blood pressure (rare, temporary)  Seizure (very rare)  Call if you experience:   Hives or difficulty breathing ( go to the emergency room)  Inflammation or drainage at the injection site(s)  Please note:  Although the local anesthetic injected can often make your painful muscles or headache feel good for several hours after the  injection, the pain may return.  It takes 3-7 days for steroids to work.  You may not notice any pain relief for at least one week.  If effective, we will often do a series of injections spaced 3-6 weeks apart to maximally decrease your pain.  If you have any questions, please call [phone] [facility]

## 2015-12-25 ENCOUNTER — Ambulatory Visit: Payer: Medicare Other | Attending: Pain Medicine | Admitting: Pain Medicine

## 2015-12-25 ENCOUNTER — Encounter: Payer: Self-pay | Admitting: Pain Medicine

## 2015-12-25 VITALS — BP 127/64 | HR 65 | Temp 98.0°F | Resp 16 | Ht 60.0 in | Wt 228.0 lb

## 2015-12-25 DIAGNOSIS — M47816 Spondylosis without myelopathy or radiculopathy, lumbar region: Secondary | ICD-10-CM

## 2015-12-25 DIAGNOSIS — M5481 Occipital neuralgia: Secondary | ICD-10-CM | POA: Diagnosis not present

## 2015-12-25 DIAGNOSIS — M47812 Spondylosis without myelopathy or radiculopathy, cervical region: Secondary | ICD-10-CM | POA: Insufficient documentation

## 2015-12-25 DIAGNOSIS — M170 Bilateral primary osteoarthritis of knee: Secondary | ICD-10-CM

## 2015-12-25 DIAGNOSIS — E1340 Other specified diabetes mellitus with diabetic neuropathy, unspecified: Secondary | ICD-10-CM

## 2015-12-25 DIAGNOSIS — M7060 Trochanteric bursitis, unspecified hip: Secondary | ICD-10-CM

## 2015-12-25 DIAGNOSIS — M797 Fibromyalgia: Secondary | ICD-10-CM

## 2015-12-25 DIAGNOSIS — M542 Cervicalgia: Secondary | ICD-10-CM | POA: Diagnosis not present

## 2015-12-25 DIAGNOSIS — M533 Sacrococcygeal disorders, not elsewhere classified: Secondary | ICD-10-CM

## 2015-12-25 DIAGNOSIS — R51 Headache: Secondary | ICD-10-CM | POA: Diagnosis not present

## 2015-12-25 DIAGNOSIS — Z96651 Presence of right artificial knee joint: Secondary | ICD-10-CM

## 2015-12-25 MED ORDER — LACTATED RINGERS IV SOLN: 1000.0000 mL | INTRAVENOUS | Status: DC

## 2015-12-25 MED ORDER — FENTANYL CITRATE (PF) 100 MCG/2ML IJ SOLN: 100.0000 ug | Freq: Once | INTRAMUSCULAR | Status: DC

## 2015-12-25 MED ORDER — CIPROFLOXACIN HCL 250 MG PO TABS: 250.0000 mg | ORAL_TABLET | Freq: Two times a day (BID) | ORAL | Status: DC

## 2015-12-25 MED ORDER — BUPIVACAINE HCL (PF) 0.25 % IJ SOLN
INTRAMUSCULAR | Status: AC
  Administered 2015-12-25: 12:00:00
  Filled 2015-12-25: qty 30

## 2015-12-25 MED ORDER — TRIAMCINOLONE ACETONIDE 40 MG/ML IJ SUSP
INTRAMUSCULAR | Status: AC
  Administered 2015-12-25: 12:00:00
  Filled 2015-12-25: qty 1

## 2015-12-25 MED ORDER — TRIAMCINOLONE ACETONIDE 40 MG/ML IJ SUSP: 40.0000 mg | Freq: Once | INTRAMUSCULAR | Status: DC

## 2015-12-25 MED ORDER — CIPROFLOXACIN IN D5W 400 MG/200ML IV SOLN: 400.0000 mg | Freq: Once | INTRAVENOUS | Status: DC

## 2015-12-25 MED ORDER — MIDAZOLAM HCL 5 MG/5ML IJ SOLN: 5.0000 mg | Freq: Once | INTRAMUSCULAR | Status: DC

## 2015-12-25 MED ORDER — ORPHENADRINE CITRATE 30 MG/ML IJ SOLN
INTRAMUSCULAR | Status: AC
  Administered 2015-12-25: 12:00:00
  Filled 2015-12-25: qty 2

## 2015-12-25 MED ORDER — BUPIVACAINE HCL (PF) 0.25 % IJ SOLN: 30.0000 mL | Freq: Once | INTRAMUSCULAR | Status: DC

## 2015-12-25 MED ORDER — ORPHENADRINE CITRATE 30 MG/ML IJ SOLN: 60.0000 mg | Freq: Once | INTRAMUSCULAR | Status: DC

## 2015-12-25 NOTE — Patient Instructions (Addendum)
PLAN   Continue present medication oxycodone  ( NO TRAMADOL ) Please get antibiotic Cipro and begin taking Cipro antibiotic today as prescribed  F/U PCP [name] for evaliation of  BP and general medical  condition  F/U surgical evaluation. May consider pending follow-up evaluations  F/U neurological evaluation. May consider pending follow-up evaluations  May consider radiofrequency rhizolysis or intraspinal procedures pending response to present treatment and F/U evaluation   Patient to call Pain Management Center should patient have concerns prior to scheduled return appointment.Occipital Nerve Block Patient Information  Description: The occipital nerves originate in the cervical (neck) spinal cord and travel upward through muscle and tissue to supply sensation to the back of the head and top of the scalp.  In addition, the nerves control some of the muscles of the scalp.  Occipital neuralgia is an irritation of these nerves which can cause headaches, numbness of the scalp, and neck discomfort.     The occipital nerve block will interrupt nerve transmission through these nerves and can relieve pain and spasm.  The block consists of insertion of a small needle under the skin in the back of the head to deposit local anesthetic (numbing medicine) and/or steroids around the nerve.  The entire block usually lasts less than 5 minutes.  Conditions which may be treated by occipital blocks:   Muscular pain and spasm of the scalp  Nerve irritation, back of the head  Headaches  Upper neck pain  Preparation for the injection:  1. Do not eat any solid food or dairy products within 8 hours of your appointment. 2. You may drink clear liquids up to 3 hours before appointment.  Clear liquids include water, black coffee, juice or soda.  No milk or cream please. 3. You may take your regular medication, including pain medications, with a sip of water before you appointment.  Diabetics should hold  regular insulin (if taken separately) and take 1/2 normal NPH dose the morning of the procedure.  Carry some sugar containing items with you to your appointment. 4. A driver must accompany you and be prepared to drive you home after your procedure. 5. Bring all your current medications with you. 6. An IV may be inserted and sedation may be given at the discretion of the physician. 7. A blood pressure cuff, EKG, and other monitors will often be applied during the procedure.  Some patients may need to have extra oxygen administered for a short period. 8. You will be asked to provide medical information, including your allergies and medications, prior to the procedure.  We must know immediately if you are taking blood thinners (like Coumadin/Warfarin) or if you are allergic to IV iodine contrast (dye).  We must know if you could possible be pregnant.  9. Do not wear a high collared shirt or turtleneck.  Tie long hair up in the back if possible.  Possible side-effects:   Bleeding from needle site  Infection (rare, may require surgery)  Nerve injury (rare)  Hair on back of neck can be tinged with iodine scrub (this will wash out)  Light-headedness (temporary)  Pain at injection site (several days)  Decreased blood pressure (rare, temporary)  Seizure (very rare)  Call if you experience:   Hives or difficulty breathing ( go to the emergency room)  Inflammation or drainage at the injection site(s)  Please note:  Although the local anesthetic injected can often make your painful muscles or headache feel good for several hours after the injection,  the pain may return.  It takes 3-7 days for steroids to work.  You may not notice any pain relief for at least one week.  If effective, we will often do a series of injections spaced 3-6 weeks apart to maximally decrease your pain.  If you have any questions, please call [phone] [facility] Pain  Management Discharge Instructions  General Discharge Instructions :  If you need to reach your doctor call: Monday-Friday 8:00 am - 4:00 pm at [phone] or toll free [phone].  After clinic hours [phone] to have operator reach doctor.  Bring all of your medication bottles to all your appointments in the pain clinic.  To cancel or reschedule your appointment with Pain Management please remember to call 24 hours in advance to avoid a fee.  Refer to the educational materials which you have been given on: General Risks, I had my Procedure. Discharge Instructions, Post Sedation.  Post Procedure Instructions:  The drugs you were given will stay in your system until tomorrow, so for the next 24 hours you should not drive, make any legal decisions or drink any alcoholic beverages.  You may eat anything you prefer, but it is better to start with liquids then soups and crackers, and gradually work up to solid foods.  Please notify your doctor immediately if you have any unusual bleeding, trouble breathing or pain that is not related to your normal pain.  Depending on the type of procedure that was done, some parts of your body may feel week and/or numb.  This usually clears up by tonight or the next day.  Walk with the use of an assistive device or accompanied by an adult for the 24 hours.  You may use ice on the affected area for the first 24 hours.  Put ice in a Ziploc bag and cover with a towel and place against area 15 minutes on 15 minutes off.  You may switch to heat after 24 hours.

## 2015-12-25 NOTE — Progress Notes (Signed)
Safety precautions to be maintained throughout the outpatient stay will include: orient to surroundings, keep bed in low position, maintain call bell within reach at all times, provide assistance with transfer out of bed and ambulation.  

## 2015-12-25 NOTE — Progress Notes (Signed)
   Subjective:    Patient ID: Cynthia Jones, female    DOB: Jan 03, 1941, 75 y.o.   MRN: [id number]  HPI  NOTE: The patient is a 75 y.o.-year-old female who returns to the Pain Management Center for further evaluation and treatment of pain consisting of pain involving the region of the neck and headache.  Patient is with prior studies revealing patient to be with degenerative changes of the cervical spine. The patient pain is felt to be with significant component of pain due to bilateral occipital neuralgia with pain radiating from the cervical region to the occipital region and continuing forward to the retro-orbital region .  The risks, benefits, and expectations of the procedure have been discussed and explained to patient, who is understanding and wishes to proceed with interventional treatment as discussed and as explained to patient.  Will proceed with greater occipital nerve blocks with myoneural block injections at this time as discussed and as explained to patient.  All are understanding and in agreement with suggested treatment plan.    PROCEDURE:  Greater occipital nerve block on the left side with EKG, blood pressure, pulse, capnography, and pulse oximetry monitoring.  Procedure performed with patient in prone position.  Greater occipital nerve block on the left side.   With patient in prone position, Betadine prep of proposed entry site accomplished.  Following identification of the nuchal ridge, 22 -gauge needle was inserted at the level of the nuchal ridge medial to the occipital artery.  Following negative aspiration, 4cc 0.25% bupivacaine with Kenalog injected for left greater occipital nerve block.  Needle was removed.  Patient tolerated injection well.   Greater occipital nerve block on the rightt side. The greater occipital nerve block on the right side was performed exactly as the left greater occipital nerve block was performed and utilizing the same technique.   A total of 10  mg Kenalog was utilized for the entire procedure.  PLAN:    1. Medications: Will continue presently prescribed medication oxycodone at this time. 2. Patient to follow up with primary care physician [name] for evaluation of blood pressure and general medical condition status post procedure performed on today's visit. 3. Neurological evaluation for further assessment of headaches for further studies as discussed. 4. Surgical evaluation as discussed. We will avoid at this time  5. Patient may be candidate for Botox injections, radiofrequency procedures, as well as implantation type procedures pending response to treatment rendered on today's visit and pending follow-up evaluation. 6. Patient has been advised to adhere to proper body mechanics and to avoid activities which appear to aggravate condition.cations:  Will continue presently prescribed medications at this time. 7. The patient is understanding and in agreement with the suggested treatment plan.    Review of Systems     Objective:   Physical Exam        Assessment & Plan:

## 2015-12-26 ENCOUNTER — Telehealth: Payer: Self-pay | Admitting: *Deleted

## 2015-12-26 NOTE — Telephone Encounter (Signed)
completed

## 2016-01-09 ENCOUNTER — Ambulatory Visit: Payer: Medicare Other | Attending: Pain Medicine | Admitting: Pain Medicine

## 2016-01-09 ENCOUNTER — Encounter: Payer: Self-pay | Admitting: Pain Medicine

## 2016-01-09 VITALS — BP 152/60 | HR 74 | Temp 97.9°F | Resp 18 | Ht 60.0 in | Wt 228.0 lb

## 2016-01-09 DIAGNOSIS — M5481 Occipital neuralgia: Secondary | ICD-10-CM

## 2016-01-09 DIAGNOSIS — M7060 Trochanteric bursitis, unspecified hip: Secondary | ICD-10-CM

## 2016-01-09 DIAGNOSIS — M791 Myalgia: Secondary | ICD-10-CM | POA: Diagnosis not present

## 2016-01-09 DIAGNOSIS — M797 Fibromyalgia: Secondary | ICD-10-CM

## 2016-01-09 DIAGNOSIS — Z96651 Presence of right artificial knee joint: Secondary | ICD-10-CM

## 2016-01-09 DIAGNOSIS — M5416 Radiculopathy, lumbar region: Secondary | ICD-10-CM | POA: Diagnosis not present

## 2016-01-09 DIAGNOSIS — M47816 Spondylosis without myelopathy or radiculopathy, lumbar region: Secondary | ICD-10-CM

## 2016-01-09 DIAGNOSIS — M47817 Spondylosis without myelopathy or radiculopathy, lumbosacral region: Secondary | ICD-10-CM | POA: Diagnosis not present

## 2016-01-09 DIAGNOSIS — M533 Sacrococcygeal disorders, not elsewhere classified: Secondary | ICD-10-CM

## 2016-01-09 DIAGNOSIS — E1340 Other specified diabetes mellitus with diabetic neuropathy, unspecified: Secondary | ICD-10-CM

## 2016-01-09 DIAGNOSIS — M170 Bilateral primary osteoarthritis of knee: Secondary | ICD-10-CM

## 2016-01-09 MED ORDER — OXYCODONE HCL 5 MG PO TABS: ORAL_TABLET | ORAL | Status: DC

## 2016-01-09 NOTE — Patient Instructions (Signed)
PLAN   Continue present medication oxycodone  ( NO TRAMADOL )  F/U PCP [name] for evaliation of  BP and general medical  condition  F/U surgical evaluation. May consider pending follow-up evaluations  F/U neurological evaluation. May consider pending follow-up evaluations  May consider radiofrequency rhizolysis or intraspinal procedures pending response to present treatment and F/U evaluation   Patient to call Pain Management Center should patient have concerns prior to scheduled return appointment

## 2016-01-09 NOTE — Progress Notes (Signed)
Safety precautions to be maintained throughout the outpatient stay will include: orient to surroundings, keep bed in low position, maintain call bell within reach at all times, provide assistance with transfer out of bed and ambulation.  

## 2016-01-09 NOTE — Progress Notes (Signed)
   Subjective:    Patient ID: Cynthia Jones, female    DOB: 1941-08-07, 75 y.o.   MRN: [id number]  HPI  The patient is a 75 year old female who returns to pain management for further evaluation and treatment of headaches as well as pain involving the lower back lower extremity region and knees. The patient states that she has resolution of her headache following bilateral occipital nerve blocks. The patient states that she does have some lower back lower extremity pain which she feels is fairly well-controlled at this time. We discussed patient's condition and patient will inform us if she has any increase of her lower back lower extremity pain. The patient denies trauma change in events of daily living the call significant change in symptomatology. We will continue medications as prescribed this is not oxycodone and will consider patient for modification of treatment regimen pending follow-up evaluation. All agreed to suggested treatment plan.   Review of Systems     Objective:   Physical Exam There was tends to palpation of the splenius capitis and occipitalis musculatures and a mild degree with mild tenderness of the cervical facet cervical paraspinal musculature region. Palpation of the thoracic facet thoracic paraspinal must reason was with mild tinnitus to palpation as well there was tenderness of the acromioclavicular and glenohumeral joint region a mild degree and patient appeared to be unremarkable Spurling's maneuver. Palpation of the thoracic region thoracic facet region was with no crepitus of the thoracic region with mild muscle spasms of the thoracic region noted. Palpation over the lumbar paraspinal muscles lumbar facet region associated with moderate increase of pain with lateral bending rotation extension and palpation of the lumbar facets reproducing moderate discomfort the left as well as on the right. There was tenderness over the PSIS and PII S region as well as the gluteal and  piriformis musculature regions. Straight leg raise was tolerates approximately 20 without increased pain with dorsiflexion noted. No sensory deficit dermatomal dystrophy detected. There was well-healed surgical scar of the knee without increased warmth and erythema in the region of the knees There was no increased warmth and erythema in the region of the knees. There was negative clonus negative Homans. Abdomen nontender with no costovertebral tenderness noted.        Assessment & Plan:     Bilateral occipital neuralgia  Degenerative disc disease lumbar spine  Lumbar stenosis with neurogenic claudication  Lumbar facet syndrome  Fibromyalgia  Osteoarthritis  Degenerative joint disease of knees and hands  Status post total knee replacement      PLAN   Continue present medication oxycodone  ( NO TRAMADOL )  F/U PCP [name] for evaliation of  BP and general medical  condition  F/U surgical evaluation. May consider pending follow-up evaluations  F/U neurological evaluation. May consider pending follow-up evaluations  May consider radiofrequency rhizolysis or intraspinal procedures pending response to present treatment and F/U evaluation   Patient to call Pain Management Center should patient have concerns prior to scheduled return appointment

## 2016-02-06 ENCOUNTER — Ambulatory Visit: Payer: Medicare Other | Attending: Pain Medicine | Admitting: Pain Medicine

## 2016-02-06 ENCOUNTER — Encounter: Payer: Self-pay | Admitting: Pain Medicine

## 2016-02-06 VITALS — BP 149/55 | HR 75 | Temp 97.6°F | Resp 16 | Ht 60.0 in | Wt 228.0 lb

## 2016-02-06 DIAGNOSIS — M7060 Trochanteric bursitis, unspecified hip: Secondary | ICD-10-CM

## 2016-02-06 DIAGNOSIS — M542 Cervicalgia: Secondary | ICD-10-CM | POA: Diagnosis present

## 2016-02-06 DIAGNOSIS — R51 Headache: Secondary | ICD-10-CM | POA: Diagnosis present

## 2016-02-06 DIAGNOSIS — M19041 Primary osteoarthritis, right hand: Secondary | ICD-10-CM | POA: Diagnosis not present

## 2016-02-06 DIAGNOSIS — M791 Myalgia: Secondary | ICD-10-CM | POA: Diagnosis not present

## 2016-02-06 DIAGNOSIS — M5136 Other intervertebral disc degeneration, lumbar region: Secondary | ICD-10-CM | POA: Diagnosis not present

## 2016-02-06 DIAGNOSIS — M170 Bilateral primary osteoarthritis of knee: Secondary | ICD-10-CM | POA: Diagnosis not present

## 2016-02-06 DIAGNOSIS — M4806 Spinal stenosis, lumbar region: Secondary | ICD-10-CM | POA: Diagnosis not present

## 2016-02-06 DIAGNOSIS — Z96659 Presence of unspecified artificial knee joint: Secondary | ICD-10-CM | POA: Insufficient documentation

## 2016-02-06 DIAGNOSIS — M5416 Radiculopathy, lumbar region: Secondary | ICD-10-CM | POA: Diagnosis not present

## 2016-02-06 DIAGNOSIS — M797 Fibromyalgia: Secondary | ICD-10-CM

## 2016-02-06 DIAGNOSIS — M533 Sacrococcygeal disorders, not elsewhere classified: Secondary | ICD-10-CM

## 2016-02-06 DIAGNOSIS — M5481 Occipital neuralgia: Secondary | ICD-10-CM | POA: Diagnosis not present

## 2016-02-06 DIAGNOSIS — E1340 Other specified diabetes mellitus with diabetic neuropathy, unspecified: Secondary | ICD-10-CM

## 2016-02-06 DIAGNOSIS — M47817 Spondylosis without myelopathy or radiculopathy, lumbosacral region: Secondary | ICD-10-CM | POA: Diagnosis not present

## 2016-02-06 DIAGNOSIS — M19042 Primary osteoarthritis, left hand: Secondary | ICD-10-CM | POA: Diagnosis not present

## 2016-02-06 DIAGNOSIS — M47816 Spondylosis without myelopathy or radiculopathy, lumbar region: Secondary | ICD-10-CM

## 2016-02-06 DIAGNOSIS — Z96651 Presence of right artificial knee joint: Secondary | ICD-10-CM

## 2016-02-06 MED ORDER — OXYCODONE HCL 5 MG PO TABS: ORAL_TABLET | ORAL | Status: DC

## 2016-02-06 NOTE — Progress Notes (Signed)
   Subjective:    Patient ID: Cynthia Jones, female    DOB: 1940/10/18, 75 y.o.   MRN: [id number]  HPI  The patient is a 75 year old female who returns to pain management for further evaluation and treatment of pain which involved pain involving the neck associated with headaches as well as pain involving the lower back and especially the region of the knees is in time patient states that she had significant improvement of her headache with prior procedures consisting of greater occipital nerve blocks. The patient states the pain involving the knees is minimal as well. Patient stated that she is tolerating medications well without undesirable side effects and that she is able to perform most activities of daily living that spares in severely disabling pain. The patient denied any trauma change in events of daily living the call significant change in symptomatology. We discussed patient's condition and will consider patient for interventional treatment should to be returned of any significant pain. At the present time patient will continue oxycodone as prescribed and will call pain management should there be any change in condition prior to scheduled return appointment. All agreed to suggested treatment plan.  Review of Systems     Objective:   Physical Exam  There was tenderness of the splenius capitis and occipitalis musculature regions of minimal degree. No masses of the head and neck were noted. There was no excessive tends to palpation of the temporomandibular joint region and there were no bounding pulsations of the temporal region noted. There were no new lesions of the head and neck noted and no masses of the head and neck noted. Palpation of the thoracic facet thoracic paraspinal musculature region was with minimal increased pain with no crepitus of the thoracic region noted. Patient appeared to be with bilaterally equal grip strength with Tinel and Phalen's maneuver reproducing minimal  discomfort. There was tenderness over the region of the lumbar paraspinal musculatures and lumbar facet region a mild degree with lateral bending rotation extension and palpation of the lumbar facets reproducing mild discomfort there was mild tenderness along the greater trochanteric region iliotibial band region with mild to moderate tenderness of the PSIS and PII S regions. Straight leg raising was tolerates approximately 20 without an increase of pain with dorsiflexion noted. There was well-healed surgical scar of the knee with moderate tenderness to palpation of the knees noted. EHL strength was slightly decreased. No sensory deficit of dermatomal distribution detected. There was negative clonus negative Homans. Abdomen protuberant with no excessive tends to palpation and no costovertebral tenderness noted      Assessment & Plan:     Bilateral occipital neuralgia  Degenerative disc disease lumbar spine  Lumbar stenosis with neurogenic claudication  Lumbar facet syndrome  Fibromyalgia  Osteoarthritis  Degenerative joint disease of knees and hands  Status post total knee replacement     PLAN   Continue present medication oxycodone  ( NO TRAMADOL )  F/U PCP [name] for evaliation of  BP and general medical  condition  F/U surgical evaluation. May consider pending follow-up evaluations  F/U neurological evaluation. May consider [facility] EMG studies and other studies pending follow-up evaluations  May consider radiofrequency rhizolysis or intraspinal procedures pending response to present treatment and F/U evaluation   Patient to call Pain Management Center should patient have concerns prior to scheduled return appointment

## 2016-02-06 NOTE — Patient Instructions (Signed)
PLAN   Continue present medication oxycodone  ( NO TRAMADOL )  F/U PCP [name] for evaliation of  BP and general medical  condition  F/U surgical evaluation. May consider pending follow-up evaluations  F/U neurological evaluation. May consider [facility] EMG studies and other studies pending follow-up evaluations  May consider radiofrequency rhizolysis or intraspinal procedures pending response to present treatment and F/U evaluation   Patient to call Pain Management Center should patient have concerns prior to scheduled return appointment

## 2016-02-06 NOTE — Progress Notes (Signed)
   Subjective:    Patient ID: Cynthia Jones, female    DOB: 29-Jun-1941, 75 y.o.   MRN: [id number]  HPI    Review of Systems     Objective:   Physical Exam        Assessment & Plan:

## 2016-02-13 LAB — TOXASSURE SELECT 13 (MW), URINE: PDF: 0

## 2016-02-16 NOTE — Progress Notes (Signed)
Quick Note:  Reviewed. ______ 

## 2016-03-10 ENCOUNTER — Encounter: Payer: Self-pay | Admitting: Pain Medicine

## 2016-03-10 ENCOUNTER — Ambulatory Visit: Payer: Medicare Other | Attending: Pain Medicine | Admitting: Pain Medicine

## 2016-03-10 VITALS — BP 137/63 | HR 70 | Temp 98.6°F | Resp 16 | Ht 60.0 in | Wt 228.0 lb

## 2016-03-10 DIAGNOSIS — M7060 Trochanteric bursitis, unspecified hip: Secondary | ICD-10-CM

## 2016-03-10 DIAGNOSIS — M170 Bilateral primary osteoarthritis of knee: Secondary | ICD-10-CM

## 2016-03-10 DIAGNOSIS — M797 Fibromyalgia: Secondary | ICD-10-CM | POA: Diagnosis not present

## 2016-03-10 DIAGNOSIS — M47817 Spondylosis without myelopathy or radiculopathy, lumbosacral region: Secondary | ICD-10-CM | POA: Diagnosis not present

## 2016-03-10 DIAGNOSIS — M5416 Radiculopathy, lumbar region: Secondary | ICD-10-CM | POA: Diagnosis not present

## 2016-03-10 DIAGNOSIS — M4806 Spinal stenosis, lumbar region: Secondary | ICD-10-CM | POA: Diagnosis not present

## 2016-03-10 DIAGNOSIS — M47816 Spondylosis without myelopathy or radiculopathy, lumbar region: Secondary | ICD-10-CM

## 2016-03-10 DIAGNOSIS — M5136 Other intervertebral disc degeneration, lumbar region: Secondary | ICD-10-CM | POA: Insufficient documentation

## 2016-03-10 DIAGNOSIS — M19042 Primary osteoarthritis, left hand: Secondary | ICD-10-CM | POA: Diagnosis not present

## 2016-03-10 DIAGNOSIS — Z96659 Presence of unspecified artificial knee joint: Secondary | ICD-10-CM | POA: Diagnosis not present

## 2016-03-10 DIAGNOSIS — M5481 Occipital neuralgia: Secondary | ICD-10-CM

## 2016-03-10 DIAGNOSIS — M19041 Primary osteoarthritis, right hand: Secondary | ICD-10-CM | POA: Diagnosis not present

## 2016-03-10 DIAGNOSIS — E1340 Other specified diabetes mellitus with diabetic neuropathy, unspecified: Secondary | ICD-10-CM

## 2016-03-10 DIAGNOSIS — Z96651 Presence of right artificial knee joint: Secondary | ICD-10-CM

## 2016-03-10 DIAGNOSIS — R51 Headache: Secondary | ICD-10-CM | POA: Diagnosis present

## 2016-03-10 DIAGNOSIS — M791 Myalgia: Secondary | ICD-10-CM | POA: Diagnosis not present

## 2016-03-10 DIAGNOSIS — M533 Sacrococcygeal disorders, not elsewhere classified: Secondary | ICD-10-CM

## 2016-03-10 DIAGNOSIS — M542 Cervicalgia: Secondary | ICD-10-CM | POA: Diagnosis present

## 2016-03-10 MED ORDER — OXYCODONE HCL 5 MG PO TABS: ORAL_TABLET | ORAL | Status: DC

## 2016-03-10 NOTE — Progress Notes (Signed)
Patient here for medication management Safety precautions to be maintained throughout the outpatient stay will include: orient to surroundings, keep bed in low position, maintain call bell within reach at all times, provide assistance with transfer out of bed and ambulation.  

## 2016-03-10 NOTE — Progress Notes (Signed)
   Subjective:    Patient ID: Cynthia Jones, female    DOB: 1941/02/19, 75 y.o.   MRN: [id number]  HPI  The patient is a 75 year old female who returns to pain management for further evaluation and treatment of pain involving the neck associated with headaches as well as entire back upper and lower extremity regions. The patient is with pain involving the region of the knees and states that her lower back lower extremity pain increases with standing and walking. The patient also admits to increased weakness of the lower extremities with prolonged standing and walking. On today's visit we discussed patient's condition and discussed performing lumbar epidural steroid injection. Been explained to patient that the lumbar epidural steroid injection we decrease the weakness and pain of the lower back and lower extremity regions associated with increased standing and walking. At the present time we will continue patient's oxycodone and we'll remain available to consider additional modifications of treatment as well as additional evaluations and studies pending response to treatment and pending follow-up evaluation. All agreed to suggested treatment plan   Review of Systems     Objective:   Physical Exam  There was mild tenderness of the splenius capitis and occipitalis region as well as the cervical facet cervical paraspinal musculature region without increased pain of significant degree. Palpation over the acromioclavicular and glenohumeral joint regions reproduces minimal discomfort and patient appeared to be with unremarkable Spurling's maneuver and unremarkable drop test. The patient was with no significant increase of pain with Tinel and Phalen's maneuver and grip strength appeared to be. Palpation over the thoracic region was with tenderness to palpation with no crepitus of the thoracic region noted. Palpation over the lumbar region lumbar paraspinal musculature region was with moderate tenderness  to palpation with lateral bending rotation extension and palpation over the lumbar facets reproducing moderate discomfort. Straight leg raise was tolerates approximately 20 without an increase of pain with dorsiflexion noted. The knees were with tenderness to palpation with negative anterior and posterior drawer signs without ballottement of the patella. There was crepitus of the knees with increased pain with range of motion maneuvers of the knee. There was no definite sensory deficit of dermatomal distribution of the lower extremities noted. EHL strength appeared to be slightly decreased. There was negative clonus negative Homans. Abdomen nontender with no costovertebral tenderness noted    Assessment & Plan:     Bilateral occipital neuralgia  Degenerative disc disease lumbar spine  Lumbar stenosis with neurogenic claudication  Lumbar facet syndrome  Fibromyalgia  Osteoarthritis  Degenerative joint disease of knees and hands  Status post total knee replacement     PLAN  Continue present medication Oxycodone  May consider lumbar epidural steroid injection for lower extremity pain paresthesias and weakness  F/U PCP [name] for evaluation of  BP and general medical  condition  F/U surgical evaluation. May consider pending follow-up evaluations  F/U neurological evaluation. May consider pending follow-up evaluations  May consider radiofrequency rhizolysis or intraspinal procedures pending response to present treatment and F/U evaluation   Patient to call Pain Management Center should patient have concerns prior to scheduled return appointment.

## 2016-03-10 NOTE — Patient Instructions (Addendum)
[facility] Discharge Instructions  General Discharge Instructions :  If you need to reach your doctor call: Monday-Friday 8:00 am - 4:00 pm at [phone] or toll free [phone].  After clinic hours [phone] to have operator reach doctor.  Bring all of your medication bottles to all your appointments in the pain clinic.  To cancel or reschedule your appointment with Pain Management please remember to call 24 hours in advance to avoid a fee.  Refer to the educational materials which you have been given on: General Risks, I had my Procedure. Discharge Instructions, Post Sedation.  Post Procedure Instructions:  The drugs you were given will stay in your system until tomorrow, so for the next 24 hours you should not drive, make any legal decisions or drink any alcoholic beverages.  You may eat anything you prefer, but it is better to start with liquids then soups and crackers, and gradually work up to solid foods.  Please notify your doctor immediately if you have any unusual bleeding, trouble breathing or pain that is not related to your normal pain.  Depending on the type of procedure that was done, some parts of your body may feel week and/or numb.  This usually clears up by tonight or the next day.  Walk with the use of an assistive device or accompanied by an adult for the 24 hours.  You may use ice on the affected area for the first 24 hours.  Put ice in a Ziploc bag and cover with a towel and place against area 15 minutes on 15 minutes off.  You may switch to heat after 24 hours.Pain Management Discharge Instructions  General Discharge Instructions :  If you need to reach your doctor call: Monday-Friday 8:00 am - 4:00 pm at [phone] or toll free [phone].  After clinic hours [phone] to have operator reach doctor.  Bring all of your medication bottles to all your appointments in the pain clinic.  To cancel or reschedule your appointment with Pain  Management please remember to call 24 hours in advance to avoid a fee.  Refer to the educational materials which you have been given on: General Risks, I had my Procedure. Discharge Instructions, Post Sedation.  Post Procedure Instructions:  The drugs you were given will stay in your system until tomorrow, so for the next 24 hours you should not drive, make any legal decisions or drink any alcoholic beverages.  You may eat anything you prefer, but it is better to start with liquids then soups and crackers, and gradually work up to solid foods.  Please notify your doctor immediately if you have any unusual bleeding, trouble breathing or pain that is not related to your normal pain.  Depending on the type of procedure that was done, some parts of your body may feel week and/or numb.  This usually clears up by tonight or the next day.  Walk with the use of an assistive device or accompanied by an adult for the 24 hours.  You may use ice on the affected area for the first 24 hours.  Put ice in a Ziploc bag and cover with a towel and place against area 15 minutes on 15 minutes off.  You may switch to heat after 24 hours.

## 2016-03-16 ENCOUNTER — Ambulatory Visit (INDEPENDENT_AMBULATORY_CARE_PROVIDER_SITE_OTHER): Payer: Medicare Other | Admitting: Family Medicine

## 2016-03-16 ENCOUNTER — Other Ambulatory Visit: Payer: Self-pay | Admitting: Family Medicine

## 2016-03-16 ENCOUNTER — Encounter: Payer: Self-pay | Admitting: Family Medicine

## 2016-03-16 VITALS — BP 137/65 | HR 70 | Temp 97.5°F | Wt 222.0 lb

## 2016-03-16 DIAGNOSIS — F32A Depression, unspecified: Secondary | ICD-10-CM

## 2016-03-16 DIAGNOSIS — F329 Major depressive disorder, single episode, unspecified: Secondary | ICD-10-CM

## 2016-03-16 MED ORDER — ALPRAZOLAM 0.25 MG PO TABS: 0.2500 mg | ORAL_TABLET | Freq: Two times a day (BID) | ORAL | Status: DC | PRN

## 2016-03-16 NOTE — Progress Notes (Signed)
BP 137/65 mmHg  Pulse 70  Temp(Src) 97.5 F (36.4 C)  Wt 222 lb (100.699 kg)  SpO2 99%   Subjective:    Patient ID: Cynthia Jones, female    DOB: Apr 20, 1941, 75 y.o.   MRN: [id number]  HPI: Cynthia Jones is a 75 y.o. female  Chief Complaint  Patient presents with  . Depression    she lost of her husband of 55 years about a year ago. last few months have been hard.    Pt complains of worsening anxiety and depressive mood over the past month or so. Symptoms are triggered by her husband's passing about one year ago, states lately she has not been coping well at all and can barely sleep at night, tearful throughout the days, no longer wanting to do normal activities like go to church. She feels sometimes like theres a huge weight on her chest from the pain. Her pastor is a great grief counselor, so she goes to him frequently to talk to him. She has great family support. Taking cymbalta daily for fibromyalgia, but wanting something more than that. Has tried xanax in the past for temporary stressors with good relief.   Relevant past medical, surgical, family and social history reviewed and updated as indicated. Interim medical history since our last visit reviewed. Allergies and medications reviewed and updated.  Review of Systems  Constitutional: Negative.   HENT: Negative.   Respiratory: Negative.   Cardiovascular: Negative.   Gastrointestinal: Negative.   Musculoskeletal: Negative.   Neurological: Negative.   Psychiatric/Behavioral: Positive for dysphoric mood. The patient is nervous/anxious.     Per HPI unless specifically indicated above     Objective:    BP 137/65 mmHg  Pulse 70  Temp(Src) 97.5 F (36.4 C)  Wt 222 lb (100.699 kg)  SpO2 99%  Wt Readings from Last 3 Encounters:  03/16/16 222 lb (100.699 kg)  03/10/16 228 lb (103.42 kg)  02/06/16 228 lb (103.42 kg)    Physical Exam  Constitutional: She is oriented to person, place, and time. She appears  well-developed and well-nourished.  Tearful  HENT:  Head: Atraumatic.  Eyes: Conjunctivae are normal. No scleral icterus.  Neck: Normal range of motion. Neck supple.  Cardiovascular: Normal rate.   Pulmonary/Chest: Effort normal.  Musculoskeletal: Normal range of motion.  Neurological: She is alert and oriented to person, place, and time.  Skin: Skin is warm and dry.  Psychiatric: Her behavior is normal. Thought content normal.  Patient tearful during visit   Nursing note and vitals reviewed.     Assessment & Plan:   Problem List Items Addressed This Visit      Other   Depression - Primary   Relevant Medications   ALPRAZolam (XANAX) 0.25 MG tablet     With acute, intermittent anxiety symptoms the past month or so. Discussed with patient the importance of utilizing grief counseling to help learn coping strategies. Discussed some different medication options, including hydroxyzine, patient insistent about xanax as she has had good results with it in the past. Long discussion about only taking it when needed, and no more than 2-3 times per day. Risks and cautions mentioned, patient aware that this is a short term medication to help her get through her grief.  Patient will follow up via phone in one week to let me know how she is doing. Follow up already scheduled for a few weeks from now with [name].   Follow up plan: Return if symptoms  worsen or fail to improve.

## 2016-03-16 NOTE — Patient Instructions (Signed)
Follow up by phone in about a week to let me know how you're doing.

## 2016-04-06 ENCOUNTER — Encounter: Payer: Self-pay | Admitting: Pain Medicine

## 2016-04-06 ENCOUNTER — Ambulatory Visit: Payer: Medicare Other | Attending: Pain Medicine | Admitting: Pain Medicine

## 2016-04-06 VITALS — BP 139/62 | HR 72 | Temp 98.0°F | Resp 18 | Ht 60.0 in | Wt 222.0 lb

## 2016-04-06 DIAGNOSIS — M47817 Spondylosis without myelopathy or radiculopathy, lumbosacral region: Secondary | ICD-10-CM | POA: Diagnosis not present

## 2016-04-06 DIAGNOSIS — M150 Primary generalized (osteo)arthritis: Secondary | ICD-10-CM

## 2016-04-06 DIAGNOSIS — M5481 Occipital neuralgia: Secondary | ICD-10-CM | POA: Insufficient documentation

## 2016-04-06 DIAGNOSIS — M159 Polyosteoarthritis, unspecified: Secondary | ICD-10-CM

## 2016-04-06 DIAGNOSIS — Z96651 Presence of right artificial knee joint: Secondary | ICD-10-CM | POA: Diagnosis not present

## 2016-04-06 DIAGNOSIS — M5416 Radiculopathy, lumbar region: Secondary | ICD-10-CM | POA: Diagnosis not present

## 2016-04-06 DIAGNOSIS — M7060 Trochanteric bursitis, unspecified hip: Secondary | ICD-10-CM

## 2016-04-06 DIAGNOSIS — M545 Low back pain: Secondary | ICD-10-CM | POA: Diagnosis present

## 2016-04-06 DIAGNOSIS — M797 Fibromyalgia: Secondary | ICD-10-CM | POA: Diagnosis not present

## 2016-04-06 DIAGNOSIS — M170 Bilateral primary osteoarthritis of knee: Secondary | ICD-10-CM | POA: Insufficient documentation

## 2016-04-06 DIAGNOSIS — M5136 Other intervertebral disc degeneration, lumbar region: Secondary | ICD-10-CM | POA: Diagnosis not present

## 2016-04-06 DIAGNOSIS — M791 Myalgia: Secondary | ICD-10-CM | POA: Diagnosis not present

## 2016-04-06 DIAGNOSIS — M4806 Spinal stenosis, lumbar region: Secondary | ICD-10-CM | POA: Diagnosis not present

## 2016-04-06 DIAGNOSIS — M79606 Pain in leg, unspecified: Secondary | ICD-10-CM | POA: Diagnosis present

## 2016-04-06 DIAGNOSIS — M47816 Spondylosis without myelopathy or radiculopathy, lumbar region: Secondary | ICD-10-CM

## 2016-04-06 DIAGNOSIS — M533 Sacrococcygeal disorders, not elsewhere classified: Secondary | ICD-10-CM

## 2016-04-06 MED ORDER — OXYCODONE HCL 5 MG PO TABS: ORAL_TABLET | ORAL | 0 refills | Status: DC

## 2016-04-06 NOTE — Patient Instructions (Addendum)
PLAN   Continue present medication oxycodone  ( NO TRAMADOL )  We will consider genicular nerve blocks of knees as discussed with you wish to have procedure  F/U PCP [name] for evaliation of  BP and general medical  condition  F/U surgical evaluation. May consider pending follow-up evaluations  F/U neurological evaluation. May consider [facility] EMG studies and other studies pending follow-up evaluations  May consider radiofrequency rhizolysis or intraspinal procedures pending response to present treatment and F/U evaluation   Patient to call Pain Management Center should patient have concerns prior to scheduled return appointment

## 2016-04-06 NOTE — Progress Notes (Signed)
    The patient is a 75 year old female who returns to pain management for further evaluation and treatment of pain involving the lower back and lower extremity region predominantly the patient states that she has had some return of severe pain involving the knees. The patient is status post total knee replacement on the right. The patient states that she would like to undergo geniculate nerve blocks of the left and right knees. We will continue patient's present medication oxycodone and we will remain available to proceed with interventional treatment once patient wishes to do such. The patient denies any trauma change in events of daily living the call significant change in symptomatology. The patient states the pain is aggravated by standing walking and becomes quite intense near the end of the day. We will consider performing procedure as discussed was patient wishes to proceed with genicular nerve blocks of the knees as discussed. All agreed to suggested treatment plan      Physical examination  There was tenderness to palpation of the splenius capitis and occipitalis region a mild degree with mild tenderness of the cervical and thoracic facet region. The patient was with bilaterally equal grip strength and Tinel and Phalen's maneuver reproducing minimal discomfort. The patient was with unremarkable Spurling's maneuver. Palpation of the thoracic region was with mild tenderness to palpation without crepitus of the thoracic region noted. Palpation over the lumbar paraspinal musculature region was of increased pain with lateral bending rotation extension and palpation over the lumbar facets reproducing moderate discomfort. There was moderate tenderness of the PSIS and PII S regions of. Palpation of the greater trochanteric region reproduced mild to moderate discomfort. There was well-healed surgical scar of the right knee with no increased warmth erythema of the left are right knees. There was tends to  palpation and increased pain of moderate to moderately severe degree with range of motion maneuvers of the knees. EHL strength appeared to be slightly decreased without a sensory deficit of dermatomal distribution detected. There was negative clonus negative Homans and abdomen was nontender with no costovertebral tenderness noted.    Assessment   Degenerative joint disease of the knees  Status post right total knee replacement  Bilateral occipital neuralgia  Degenerative disc disease lumbar spine  Lumbar stenosis with neurogenic claudication  Lumbar facet syndrome  Fibromyalgia    PLAN   Continue present medication oxycodone  ( NO TRAMADOL )  We will consider genicular nerve blocks of knees as discussed with you wish to have procedure  F/U PCP [name] for evaliation of  BP and general medical  condition  F/U surgical evaluation. May consider pending follow-up evaluations  F/U neurological evaluation. May consider [facility] EMG studies and other studies pending follow-up evaluations  May consider radiofrequency rhizolysis or intraspinal procedures pending response to present treatment and F/U evaluation   Patient to call Pain Management Center should patient have concerns prior to scheduled return appointment  Osteoarthritis  Degenerative joint disease of knees and hands

## 2016-04-06 NOTE — Progress Notes (Signed)
Safety precautions to be maintained throughout the outpatient stay will include: orient to surroundings, keep bed in low position, maintain call bell within reach at all times, provide assistance with transfer out of bed and ambulation.  

## 2016-04-08 ENCOUNTER — Ambulatory Visit (INDEPENDENT_AMBULATORY_CARE_PROVIDER_SITE_OTHER): Payer: Medicare Other | Admitting: Family Medicine

## 2016-04-08 ENCOUNTER — Encounter: Payer: Self-pay | Admitting: Family Medicine

## 2016-04-08 VITALS — BP 114/69 | HR 71 | Temp 98.1°F | Ht 59.3 in | Wt 224.0 lb

## 2016-04-08 DIAGNOSIS — Z0000 Encounter for general adult medical examination without abnormal findings: Secondary | ICD-10-CM | POA: Diagnosis not present

## 2016-04-08 DIAGNOSIS — F329 Major depressive disorder, single episode, unspecified: Secondary | ICD-10-CM

## 2016-04-08 DIAGNOSIS — E1340 Other specified diabetes mellitus with diabetic neuropathy, unspecified: Secondary | ICD-10-CM | POA: Diagnosis not present

## 2016-04-08 DIAGNOSIS — M150 Primary generalized (osteo)arthritis: Secondary | ICD-10-CM | POA: Diagnosis not present

## 2016-04-08 DIAGNOSIS — F32A Depression, unspecified: Secondary | ICD-10-CM

## 2016-04-08 DIAGNOSIS — E118 Type 2 diabetes mellitus with unspecified complications: Secondary | ICD-10-CM

## 2016-04-08 DIAGNOSIS — I10 Essential (primary) hypertension: Secondary | ICD-10-CM

## 2016-04-08 DIAGNOSIS — M797 Fibromyalgia: Secondary | ICD-10-CM | POA: Diagnosis not present

## 2016-04-08 DIAGNOSIS — M159 Polyosteoarthritis, unspecified: Secondary | ICD-10-CM

## 2016-04-08 LAB — URINALYSIS, ROUTINE W REFLEX MICROSCOPIC
BILIRUBIN UA: NEGATIVE
Glucose, UA: NEGATIVE
Ketones, UA: NEGATIVE
Leukocytes, UA: NEGATIVE
NITRITE UA: NEGATIVE
PH UA: 5.5 (ref 5.0–7.5)
PROTEIN UA: NEGATIVE
RBC UA: NEGATIVE
SPEC GRAV UA: 1.015 (ref 1.005–1.030)
UUROB: 0.2 mg/dL (ref 0.2–1.0)

## 2016-04-08 LAB — BAYER DCA HB A1C WAIVED: HB A1C (BAYER DCA - WAIVED): 6.6 % (ref <7.0)

## 2016-04-08 LAB — HEMOGLOBIN A1C: HEMOGLOBIN A1C: 6.6

## 2016-04-08 MED ORDER — CARVEDILOL 12.5 MG PO TABS: 12.5000 mg | ORAL_TABLET | Freq: Two times a day (BID) | ORAL | 4 refills | Status: DC

## 2016-04-08 MED ORDER — LOSARTAN POTASSIUM 100 MG PO TABS: 100.0000 mg | ORAL_TABLET | Freq: Every day | ORAL | 4 refills | Status: DC

## 2016-04-08 MED ORDER — DULOXETINE HCL 60 MG PO CPEP: 60.0000 mg | ORAL_CAPSULE | Freq: Every day | ORAL | 4 refills | Status: DC

## 2016-04-08 MED ORDER — ALPRAZOLAM 0.25 MG PO TABS: 0.2500 mg | ORAL_TABLET | Freq: Two times a day (BID) | ORAL | 4 refills | Status: DC | PRN

## 2016-04-08 NOTE — Assessment & Plan Note (Signed)
The current medical regimen is effective;  continue present plan and medications.  

## 2016-04-08 NOTE — Assessment & Plan Note (Signed)
Chronic pain being managed by pain clinic 

## 2016-04-08 NOTE — Progress Notes (Signed)
BP 114/69 (BP Location: Left Arm, Patient Position: Sitting, Cuff Size: Large)   Pulse 71   Temp 98.1 F (36.7 C)   Ht 4' 11.3" (1.506 m)   Wt 224 lb (101.6 kg)   SpO2 98%   BMI 44.79 kg/m    Subjective:    Patient ID: BRYNNLIE UNTERREINER, female    DOB: [birth year], 75 y.o.   MRN: [id number]  HPI: LANIJAH WARZECHA is a 75 y.o. female  Chief Complaint  Patient presents with  . Annual Exam  Patient still struggling over loss of her husband has still a lot of nerve issues takes Xanax 0.25 at least one a day sometimes twice a day. Also having spells of poor sleep but sometimes stays in bed until noon. Diabetes doing well no complaints noted low blood sugar spells Hypertension doing well no complaints from medications taken faithfully Pain management being done by Dr. crisp and stable  Relevant past medical, surgical, family and social history reviewed and updated as indicated. Interim medical history since our last visit reviewed. Allergies and medications reviewed and updated.  Review of Systems  Constitutional: Negative.   HENT: Negative.   Eyes: Negative.   Respiratory: Negative.   Cardiovascular: Negative.   Gastrointestinal: Negative.   Endocrine: Negative.   Genitourinary: Negative.   Musculoskeletal: Negative.   Skin: Negative.   Allergic/Immunologic: Negative.   Neurological: Negative.   Hematological: Negative.   Psychiatric/Behavioral: Negative.     Per HPI unless specifically indicated above     Objective:    BP 114/69 (BP Location: Left Arm, Patient Position: Sitting, Cuff Size: Large)   Pulse 71   Temp 98.1 F (36.7 C)   Ht 4' 11.3" (1.506 m)   Wt 224 lb (101.6 kg)   SpO2 98%   BMI 44.79 kg/m   Wt Readings from Last 3 Encounters:  04/08/16 224 lb (101.6 kg)  04/06/16 222 lb (100.7 kg)  03/16/16 222 lb (100.7 kg)    Physical Exam  Constitutional: She is oriented to person, place, and time. She appears well-developed and well-nourished.    HENT:  Head: Normocephalic and atraumatic.  Right Ear: External ear normal.  Left Ear: External ear normal.  Nose: Nose normal.  Mouth/Throat: Oropharynx is clear and moist.  Eyes: Conjunctivae and EOM are normal. Pupils are equal, round, and reactive to light.  Neck: Normal range of motion. Neck supple. Carotid bruit is not present.  Cardiovascular: Normal rate, regular rhythm and normal heart sounds.   No murmur heard. Pulmonary/Chest: Effort normal and breath sounds normal. She exhibits no mass. Right breast exhibits no mass, no skin change and no tenderness. Left breast exhibits no mass, no skin change and no tenderness. Breasts are symmetrical.  Abdominal: Soft. Bowel sounds are normal. There is no hepatosplenomegaly.  Musculoskeletal: Normal range of motion.  Neurological: She is alert and oriented to person, place, and time.  Skin: No rash noted.  Psychiatric: She has a normal mood and affect. Her behavior is normal. Judgment and thought content normal.    Results for orders placed or performed in visit on 04/08/16  Hemoglobin A1c  Result Value Ref Range   Hemoglobin A1C 6.6       Assessment & Plan:   Problem List Items Addressed This Visit      Cardiovascular and Mediastinum   Essential hypertension    The current medical regimen is effective;  continue present plan and medications.         Endocrine  Diabetes mellitus (HCC)    The current medical regimen is effective;  continue present plan and medications.       Relevant Orders   Bayer DCA Hb A1c Waived     Musculoskeletal and Integument   Osteoarthritis    Chronic pain being managed by pain clinic        Other   Depression    Discussed depression patient still having a lot of issues with sleep and nerves and death of her husband patient slowly moving on Discuss using less Xanax       Other Visit Diagnoses    Well adult exam    -  Primary   Relevant Orders   Urinalysis, Routine w reflex  microscopic (not at [facility])   CBC with Differential/Platelet   Comprehensive metabolic panel   Lipid Panel w/o Chol/HDL Ratio   TSH       Follow up plan: Return in about 6 months (around 10/09/2016) for Hemoglobin A1c.

## 2016-04-08 NOTE — Assessment & Plan Note (Addendum)
Discussed depression patient still having a lot of issues with sleep and nerves and death of her husband patient slowly moving on Discuss using less Xanax

## 2016-04-09 ENCOUNTER — Telehealth: Payer: Self-pay | Admitting: Family Medicine

## 2016-04-09 DIAGNOSIS — R7989 Other specified abnormal findings of blood chemistry: Secondary | ICD-10-CM

## 2016-04-09 LAB — CBC WITH DIFFERENTIAL/PLATELET
Basophils Absolute: 0.1 10*3/uL (ref 0.0–0.2)
Basos: 1 %
EOS (ABSOLUTE): 0.2 10*3/uL (ref 0.0–0.4)
EOS: 3 %
HEMATOCRIT: 39.6 % (ref 34.0–46.6)
HEMOGLOBIN: 13.2 g/dL (ref 11.1–15.9)
IMMATURE GRANULOCYTES: 0 %
Immature Grans (Abs): 0 10*3/uL (ref 0.0–0.1)
LYMPHS: 31 %
Lymphocytes Absolute: 2.6 10*3/uL (ref 0.7–3.1)
MCH: 30.8 pg (ref 26.6–33.0)
MCHC: 33.3 g/dL (ref 31.5–35.7)
MCV: 92 fL (ref 79–97)
MONOCYTES: 9 %
Monocytes Absolute: 0.8 10*3/uL (ref 0.1–0.9)
NEUTROS PCT: 56 %
Neutrophils Absolute: 4.9 10*3/uL (ref 1.4–7.0)
Platelets: 314 10*3/uL (ref 150–379)
RBC: 4.29 x10E6/uL (ref 3.77–5.28)
RDW: 13.7 % (ref 12.3–15.4)
WBC: 8.6 10*3/uL (ref 3.4–10.8)

## 2016-04-09 LAB — COMPREHENSIVE METABOLIC PANEL
ALBUMIN: 3.8 g/dL (ref 3.5–4.8)
ALT: 13 IU/L (ref 0–32)
AST: 15 IU/L (ref 0–40)
Albumin/Globulin Ratio: 1.4 (ref 1.2–2.2)
Alkaline Phosphatase: 50 IU/L (ref 39–117)
BUN / CREAT RATIO: 16 (ref 12–28)
BUN: 12 mg/dL (ref 8–27)
Bilirubin Total: 0.3 mg/dL (ref 0.0–1.2)
CALCIUM: 9.3 mg/dL (ref 8.7–10.3)
CHLORIDE: 101 mmol/L (ref 96–106)
CO2: 22 mmol/L (ref 18–29)
CREATININE: 0.75 mg/dL (ref 0.57–1.00)
GFR calc Af Amer: 91 mL/min/{1.73_m2} (ref >59)
GFR, EST NON AFRICAN AMERICAN: 79 mL/min/{1.73_m2} (ref >59)
GLOBULIN, TOTAL: 2.8 g/dL (ref 1.5–4.5)
GLUCOSE: 164 mg/dL — AB (ref 65–99)
Potassium: 4.2 mmol/L (ref 3.5–5.2)
SODIUM: 140 mmol/L (ref 134–144)
Total Protein: 6.6 g/dL (ref 6.0–8.5)

## 2016-04-09 LAB — LIPID PANEL W/O CHOL/HDL RATIO
Cholesterol, Total: 174 mg/dL (ref 100–199)
HDL: 58 mg/dL (ref >39)
LDL CALC: 86 mg/dL (ref 0–99)
Triglycerides: 149 mg/dL (ref 0–149)
VLDL CHOLESTEROL CAL: 30 mg/dL (ref 5–40)

## 2016-04-09 LAB — TSH: TSH: 7.6 u[IU]/mL — AB (ref 0.450–4.500)

## 2016-04-09 NOTE — Telephone Encounter (Signed)
Phone call Discussed with patient TSH elevated patient with no symptoms will recheck TSH in 3 months at next office visit.

## 2016-04-28 ENCOUNTER — Telehealth: Payer: Self-pay | Admitting: *Deleted

## 2016-05-05 ENCOUNTER — Encounter: Payer: Medicare Other | Admitting: Pain Medicine

## 2016-05-06 ENCOUNTER — Ambulatory Visit: Payer: Medicare Other | Attending: Pain Medicine | Admitting: Pain Medicine

## 2016-05-06 ENCOUNTER — Encounter: Payer: Self-pay | Admitting: Pain Medicine

## 2016-05-06 VITALS — BP 145/48 | HR 73 | Temp 97.3°F | Ht 60.0 in | Wt 224.0 lb

## 2016-05-06 DIAGNOSIS — Z96651 Presence of right artificial knee joint: Secondary | ICD-10-CM | POA: Diagnosis not present

## 2016-05-06 DIAGNOSIS — M25561 Pain in right knee: Secondary | ICD-10-CM | POA: Diagnosis present

## 2016-05-06 DIAGNOSIS — M5481 Occipital neuralgia: Secondary | ICD-10-CM | POA: Insufficient documentation

## 2016-05-06 DIAGNOSIS — M62830 Muscle spasm of back: Secondary | ICD-10-CM | POA: Diagnosis not present

## 2016-05-06 DIAGNOSIS — M4806 Spinal stenosis, lumbar region: Secondary | ICD-10-CM | POA: Insufficient documentation

## 2016-05-06 DIAGNOSIS — M5136 Other intervertebral disc degeneration, lumbar region: Secondary | ICD-10-CM | POA: Insufficient documentation

## 2016-05-06 DIAGNOSIS — M47816 Spondylosis without myelopathy or radiculopathy, lumbar region: Secondary | ICD-10-CM

## 2016-05-06 DIAGNOSIS — M47817 Spondylosis without myelopathy or radiculopathy, lumbosacral region: Secondary | ICD-10-CM | POA: Diagnosis not present

## 2016-05-06 DIAGNOSIS — M797 Fibromyalgia: Secondary | ICD-10-CM | POA: Insufficient documentation

## 2016-05-06 DIAGNOSIS — M791 Myalgia: Secondary | ICD-10-CM | POA: Diagnosis not present

## 2016-05-06 DIAGNOSIS — M7060 Trochanteric bursitis, unspecified hip: Secondary | ICD-10-CM

## 2016-05-06 DIAGNOSIS — M170 Bilateral primary osteoarthritis of knee: Secondary | ICD-10-CM | POA: Diagnosis not present

## 2016-05-06 DIAGNOSIS — M542 Cervicalgia: Secondary | ICD-10-CM | POA: Diagnosis present

## 2016-05-06 DIAGNOSIS — M5416 Radiculopathy, lumbar region: Secondary | ICD-10-CM | POA: Diagnosis not present

## 2016-05-06 DIAGNOSIS — M25562 Pain in left knee: Secondary | ICD-10-CM | POA: Diagnosis present

## 2016-05-06 MED ORDER — OXYCODONE HCL 5 MG PO TABS: ORAL_TABLET | ORAL | 0 refills | Status: DC

## 2016-05-06 NOTE — Progress Notes (Signed)
    The patient is a 75 year old female who returns to pain management for further evaluation and treatment of pain involving the neck as well as pain involving the knees. The patient states that the lower back pain has been moderate patient also states that the pain is beginning return involving the region of the knees. The patient states that headache is fairly well-controlled at this time. We will continue oxycodone as prescribed this time and patient will call pain management if it is a change in condition prior to scheduled return appointment. The patient was with understanding and agreement suggested treatment plan. We will continue oxycodone at the present time.     Physical examination   There was tenderness to palpation of the splenius capitis and occipitalis regions palpation which produced pain of mild-to-moderate degree. There was mild to moderate tenderness over the cervical facet cervical paraspinal musculature region. Palpation over the thoracic region was attends to palpation of moderate degree with evidence of this. Palpation over the acromioclavicular and glenohumeral joint regions reproduced pain of mild-to-moderate degree and patient was able to perform drop test without significant difficulty. The patient was at unremarkable Spurling's maneuver. Palpation over the thoracic region was without crepitus of the thoracic region with evidence of muscle spasm involving the thoracic region. Palpation over the lumbar paraspinal must reason lumbar facet region was with increased pain with lateral bending rotation extension and palpation of the lumbar facets reproducing moderate discomfort. There was moderate tenderness of the PSIS and PII S region as well as palpation of the gluteal and piriformis musculature region reproduced mild discomfort. The knees were with tenderness to palpation without increased warmth or erythema. EHL strength appeared to be decreased with negative clonus negative  Homans. Abdomen nontender with no costovertebral tenderness noted.    Assessment   Degenerative joint disease of the knees  Status post right total knee replacement  Bilateral occipital neuralgia  Degenerative disc disease lumbar spine  Lumbar stenosis with neurogenic claudication  Lumbar facet syndrome  Fibromyalgia     PLAN   Continue present medication oxycodone  ( NO TRAMADOL )  We will consider genicular nerve blocks of knees as discussed with you wish to have procedure as previously discussed  F/U PCP [name] for evaliation of  BP and general medical  condition  F/U surgical evaluation. May consider pending follow-up evaluations  F/U neurological evaluation. May consider PNCV EMG studies and other studies pending follow-up evaluations  May consider radiofrequency rhizolysis or intraspinal procedures pending response to present treatment and F/U evaluation   Patient to call Pain Management Center should patient have concerns prior to scheduled return appointment

## 2016-05-06 NOTE — Progress Notes (Signed)
Safety precautions to be maintained throughout the outpatient stay will include: orient to surroundings, keep bed in low position, maintain call bell within reach at all times, provide assistance with transfer out of bed and ambulation.  

## 2016-05-07 NOTE — Patient Instructions (Signed)
PLAN   Continue present medication oxycodone  ( NO TRAMADOL )  We will consider genicular nerve blocks of knees as discussed with you wish to have procedure as previously discussed  F/U PCP [name] for evaliation of  BP and general medical  condition  F/U surgical evaluation. May consider pending follow-up evaluations  F/U neurological evaluation. May consider PNCV EMG studies and other studies pending follow-up evaluations  May consider radiofrequency rhizolysis or intraspinal procedures pending response to present treatment and F/U evaluation   Patient to call Pain Management Center should patient have concerns prior to scheduled return appointment

## 2016-05-18 ENCOUNTER — Ambulatory Visit (INDEPENDENT_AMBULATORY_CARE_PROVIDER_SITE_OTHER): Payer: Medicare Other | Admitting: Family Medicine

## 2016-05-18 ENCOUNTER — Encounter: Payer: Self-pay | Admitting: Family Medicine

## 2016-05-18 VITALS — BP 138/77 | HR 74 | Temp 98.9°F | Wt 221.0 lb

## 2016-05-18 DIAGNOSIS — J069 Acute upper respiratory infection, unspecified: Secondary | ICD-10-CM

## 2016-05-18 MED ORDER — LORATADINE 10 MG PO TABS: 10.0000 mg | ORAL_TABLET | Freq: Every day | ORAL | 11 refills | Status: DC

## 2016-05-18 MED ORDER — AMOXICILLIN 875 MG PO TABS
875.0000 mg | ORAL_TABLET | Freq: Two times a day (BID) | ORAL | 0 refills | Status: DC
Start: 2016-05-18 — End: 2016-06-10

## 2016-05-18 NOTE — Patient Instructions (Signed)
Follow up as needed

## 2016-05-18 NOTE — Progress Notes (Signed)
   BP 138/77   Pulse 74   Temp 98.9 F (37.2 C)   Wt 221 lb (100.2 kg)   SpO2 98%   BMI 43.16 kg/m    Subjective:    Patient ID: Cynthia Jones, female    DOB: [birth year], 75 y.o.   MRN: [id number]  HPI: Cynthia Jones is a 75 y.o. female  Chief Complaint  Patient presents with  . URI    since Friday, chest and head congestion, cough (some yellow mucus), some runny nose, scratchy throat, no ear ache, no known fever. She has been trying Coricedan HBP   Started 4 days ago with sinus congestion and pressure, productive cough, nasal drainage, and now feeling it come down into her chest. Denies fevers or wheezing, but has been SOB the past day or so. Has been taking coricidin HBP with some relief.   Relevant past medical, surgical, family and social history reviewed and updated as indicated. Interim medical history since our last visit reviewed. Allergies and medications reviewed and updated.  Review of Systems  Constitutional: Negative.   HENT: Positive for congestion, rhinorrhea, sinus pressure and sore throat.   Eyes: Negative.   Respiratory: Negative.   Cardiovascular: Negative.   Gastrointestinal: Negative.   Genitourinary: Negative.   Musculoskeletal: Negative.   Neurological: Negative.   Psychiatric/Behavioral: Negative.     Per HPI unless specifically indicated above     Objective:    BP 138/77   Pulse 74   Temp 98.9 F (37.2 C)   Wt 221 lb (100.2 kg)   SpO2 98%   BMI 43.16 kg/m   Wt Readings from Last 3 Encounters:  05/18/16 221 lb (100.2 kg)  05/06/16 224 lb (101.6 kg)  04/08/16 224 lb (101.6 kg)    Physical Exam  Constitutional: She is oriented to person, place, and time. She appears well-developed and well-nourished. No distress.  HENT:  Head: Atraumatic.  Right Ear: External ear normal.  Left Ear: External ear normal.  Mouth/Throat: Oropharynx is clear and moist.  B/l nasal mucosa erythematous with clear discharge Oropharynx  erythematous  Eyes: Conjunctivae are normal. No scleral icterus.  Neck: Normal range of motion. Neck supple.  Cardiovascular: Normal rate, regular rhythm and normal heart sounds.   Pulmonary/Chest: Effort normal and breath sounds normal. No respiratory distress. She has no wheezes. She has no rales.  Musculoskeletal: Normal range of motion.  Neurological: She is alert and oriented to person, place, and time.  Skin: Skin is warm and dry.  Psychiatric: She has a normal mood and affect. Her behavior is normal.  Nursing note and vitals reviewed.     Assessment & Plan:   Problem List Items Addressed This Visit    None    Visit Diagnoses    Upper respiratory infection    -  Primary   Likely allergy related, restart claritin daily. Continue coricidin, mucinex, sinus rinses, humidifiers. Amoxicillin sent to use at end of week if no better       Follow up plan: Return if symptoms worsen or fail to improve.

## 2016-05-27 ENCOUNTER — Telehealth: Payer: Self-pay

## 2016-05-27 MED ORDER — DOXYCYCLINE HYCLATE 100 MG PO TABS
100.0000 mg | ORAL_TABLET | Freq: Two times a day (BID) | ORAL | 0 refills | Status: DC
Start: 2016-05-27 — End: 2016-06-10

## 2016-05-27 NOTE — Telephone Encounter (Signed)
She finished the meds and she hasn't improved any. Wants to know if you can call her in something stronger.

## 2016-05-27 NOTE — Telephone Encounter (Signed)
Sent in a different antibiotic to her pharmacy

## 2016-06-10 ENCOUNTER — Ambulatory Visit (INDEPENDENT_AMBULATORY_CARE_PROVIDER_SITE_OTHER): Payer: Medicare Other | Admitting: Family Medicine

## 2016-06-10 ENCOUNTER — Other Ambulatory Visit: Payer: Self-pay

## 2016-06-10 ENCOUNTER — Encounter: Payer: Self-pay | Admitting: Family Medicine

## 2016-06-10 VITALS — BP 152/85 | HR 72 | Temp 97.4°F | Wt 221.0 lb

## 2016-06-10 DIAGNOSIS — M47816 Spondylosis without myelopathy or radiculopathy, lumbar region: Secondary | ICD-10-CM

## 2016-06-10 DIAGNOSIS — Z23 Encounter for immunization: Secondary | ICD-10-CM | POA: Diagnosis not present

## 2016-06-10 DIAGNOSIS — M1288 Other specific arthropathies, not elsewhere classified, other specified site: Secondary | ICD-10-CM

## 2016-06-10 NOTE — Progress Notes (Signed)
BP (!) 152/85   Pulse 72   Temp 97.4 F (36.3 C)   Wt 221 lb (100.2 kg)   SpO2 97%   BMI 43.16 kg/m    Subjective:    Patient ID: Cynthia Jones, female    DOB: [birth year], 75 y.o.   MRN: [id number]  HPI: Cynthia Jones is a 75 y.o. female  Chief Complaint  Patient presents with  . Pain    Dr,Crisp is moving and wants to know if we can write her pain meds until new doctor comes in Jan.  Patient has potential for new pain clinic doctor appointment in January. Dr. crisp has moved to [facility] to continue his pain clinic there. Patient wanting me to take over pain management and writing her pain prescriptions until her appointment in January. I reviewed with the patient I would be unable to do this unless there is something formal in writing from Dr. crisp. The patient would also need a arm appointment and start date with her new pain clinic and when they would be picking up her pain management. I reemphasized with the patient I would only be up to do this for a couple of prescriptions.  Relevant past medical, surgical, family and social history reviewed and updated as indicated. Interim medical history since our last visit reviewed. Allergies and medications reviewed and updated.  Review of Systems  Constitutional: Negative.   Respiratory: Negative.   Cardiovascular: Negative.     Per HPI unless specifically indicated above     Objective:    BP (!) 152/85   Pulse 72   Temp 97.4 F (36.3 C)   Wt 221 lb (100.2 kg)   SpO2 97%   BMI 43.16 kg/m   Wt Readings from Last 3 Encounters:  06/10/16 221 lb (100.2 kg)  05/18/16 221 lb (100.2 kg)  05/06/16 224 lb (101.6 kg)    Physical Exam  Results for orders placed or performed in visit on 04/08/16  Bayer DCA Hb A1c Waived  Result Value Ref Range   Bayer DCA Hb A1c Waived 6.6 <7.0 %  Urinalysis, Routine w reflex microscopic (not at [facility])  Result Value Ref Range   Specific Gravity, UA 1.015 1.005 - 1.030   pH,  UA 5.5 5.0 - 7.5   Color, UA Yellow Yellow   Appearance Ur Clear Clear   Leukocytes, UA Negative Negative   Protein, UA Negative Negative/Trace   Glucose, UA Negative Negative   Ketones, UA Negative Negative   RBC, UA Negative Negative   Bilirubin, UA Negative Negative   Urobilinogen, Ur 0.2 0.2 - 1.0 mg/dL   Nitrite, UA Negative Negative  CBC with Differential/Platelet  Result Value Ref Range   WBC 8.6 3.4 - 10.8 x10E3/uL   RBC 4.29 3.77 - 5.28 x10E6/uL   Hemoglobin 13.2 11.1 - 15.9 g/dL   Hematocrit 60.6 30.1 - 46.6 %   MCV 92 79 - 97 fL   MCH 30.8 26.6 - 33.0 pg   MCHC 33.3 31.5 - 35.7 g/dL   RDW 60.1 09.3 - 23.5 %   Platelets 314 150 - 379 x10E3/uL   Neutrophils 56 %   Lymphs 31 %   Monocytes 9 %   Eos 3 %   Basos 1 %   Neutrophils Absolute 4.9 1.4 - 7.0 x10E3/uL   Lymphocytes Absolute 2.6 0.7 - 3.1 x10E3/uL   Monocytes Absolute 0.8 0.1 - 0.9 x10E3/uL   EOS (ABSOLUTE) 0.2 0.0 - 0.4 x10E3/uL   Basophils  Absolute 0.1 0.0 - 0.2 x10E3/uL   Immature Granulocytes 0 %   Immature Grans (Abs) 0.0 0.0 - 0.1 x10E3/uL  Comprehensive metabolic panel  Result Value Ref Range   Glucose 164 (H) 65 - 99 mg/dL   BUN 12 8 - 27 mg/dL   Creatinine, Ser 1.61 0.57 - 1.00 mg/dL   GFR calc non Af Amer 79 >59 mL/min/1.73   GFR calc Af Amer 91 >59 mL/min/1.73   BUN/Creatinine Ratio 16 12 - 28   Sodium 140 134 - 144 mmol/L   Potassium 4.2 3.5 - 5.2 mmol/L   Chloride 101 96 - 106 mmol/L   CO2 22 18 - 29 mmol/L   Calcium 9.3 8.7 - 10.3 mg/dL   Total Protein 6.6 6.0 - 8.5 g/dL   Albumin 3.8 3.5 - 4.8 g/dL   Globulin, Total 2.8 1.5 - 4.5 g/dL   Albumin/Globulin Ratio 1.4 1.2 - 2.2   Bilirubin Total 0.3 0.0 - 1.2 mg/dL   Alkaline Phosphatase 50 39 - 117 IU/L   AST 15 0 - 40 IU/L   ALT 13 0 - 32 IU/L  Lipid Panel w/o Chol/HDL Ratio  Result Value Ref Range   Cholesterol, Total 174 100 - 199 mg/dL   Triglycerides 096 0 - 149 mg/dL   HDL 58 >04 mg/dL   VLDL Cholesterol Cal 30 5 - 40 mg/dL     LDL Calculated 86 0 - 99 mg/dL  TSH  Result Value Ref Range   TSH 7.600 (H) 0.450 - 4.500 uIU/mL  Hemoglobin A1c  Result Value Ref Range   Hemoglobin A1C 6.6       Assessment & Plan:   Problem List Items Addressed This Visit      Musculoskeletal and Integument   Facet syndrome, lumbar    Chronic pain with management by pain clinic Dr. crisp who is moving. Review reviewed with patient marked limitations on prescribing. As noted above will need formal handoff from Dr. crisp to start medication prescribing. Also before prescribing medications will need a firm in date and appointment with a new pain clinic where they will pick up her pain management prescriptions. I again made it clear to the patient I will not be managing pain or writing pain medications other than very short-term and for this very specialized reason.       Other Visit Diagnoses    Needs flu shot    -  Primary   Encounter for immunization       Relevant Orders   Flu vaccine HIGH DOSE PF (Completed)       Follow up plan: Return in about 4 weeks (around 07/08/2016) for Regular office visit with labs as noted before.[name]

## 2016-06-10 NOTE — Assessment & Plan Note (Signed)
Chronic pain with management by pain clinic Dr. crisp who is moving. Review reviewed with patient marked limitations on prescribing. As noted above will need formal handoff from Dr. crisp to start medication prescribing. Also before prescribing medications will need a firm in date and appointment with a new pain clinic where they will pick up her pain management prescriptions. I again made it clear to the patient I will not be managing pain or writing pain medications other than very short-term and for this very specialized reason.

## 2016-07-01 ENCOUNTER — Telehealth: Payer: Self-pay | Admitting: Family Medicine

## 2016-07-01 MED ORDER — AMOXICILLIN-POT CLAVULANATE 875-125 MG PO TABS: 1.0000 | ORAL_TABLET | Freq: Two times a day (BID) | ORAL | 0 refills | Status: DC

## 2016-07-01 NOTE — Telephone Encounter (Signed)
Augmentin sent.

## 2016-07-01 NOTE — Telephone Encounter (Signed)
Routing to provider  

## 2016-07-01 NOTE — Telephone Encounter (Signed)
Pt has a sinus infection and she would like to know if she could have something called in to [facility] or if she would have to be seen again.

## 2016-07-09 ENCOUNTER — Ambulatory Visit (INDEPENDENT_AMBULATORY_CARE_PROVIDER_SITE_OTHER): Payer: Medicare Other | Admitting: Family Medicine

## 2016-07-09 ENCOUNTER — Ambulatory Visit
Admission: RE | Admit: 2016-07-09 | Discharge: 2016-07-09 | Disposition: A | Discharge status: Home / Self Care | Payer: Medicare Other | Source: Ambulatory Visit | Attending: Family Medicine | Admitting: Family Medicine

## 2016-07-09 ENCOUNTER — Encounter: Payer: Self-pay | Admitting: Family Medicine

## 2016-07-09 ENCOUNTER — Other Ambulatory Visit: Payer: Self-pay | Admitting: Family Medicine

## 2016-07-09 VITALS — BP 116/71 | HR 78 | Temp 98.5°F | Ht 59.6 in | Wt 223.2 lb

## 2016-07-09 DIAGNOSIS — I10 Essential (primary) hypertension: Secondary | ICD-10-CM | POA: Diagnosis not present

## 2016-07-09 DIAGNOSIS — R059 Cough, unspecified: Secondary | ICD-10-CM

## 2016-07-09 DIAGNOSIS — I700 Atherosclerosis of aorta: Secondary | ICD-10-CM | POA: Insufficient documentation

## 2016-07-09 DIAGNOSIS — R05 Cough: Secondary | ICD-10-CM

## 2016-07-09 DIAGNOSIS — E118 Type 2 diabetes mellitus with unspecified complications: Secondary | ICD-10-CM | POA: Diagnosis not present

## 2016-07-09 DIAGNOSIS — R0602 Shortness of breath: Secondary | ICD-10-CM | POA: Diagnosis not present

## 2016-07-09 LAB — BAYER DCA HB A1C WAIVED: HB A1C: 6.7 % (ref <7.0)

## 2016-07-09 MED ORDER — AZITHROMYCIN 250 MG PO TABS: ORAL_TABLET | ORAL | 0 refills | Status: DC

## 2016-07-09 MED ORDER — FLUCONAZOLE 150 MG PO TABS: 150.0000 mg | ORAL_TABLET | Freq: Once | ORAL | 1 refills | Status: AC

## 2016-07-09 NOTE — Progress Notes (Signed)
Phone call Discussed with patient chest x-ray not showing any pneumonia but still patient with a lot of infection will change to azithromycin and if not getting better will consider pulmonary consult. Reviewed patient's allergy to erythromycin and it was some low-grade nausea only.

## 2016-07-09 NOTE — Assessment & Plan Note (Signed)
Patient with long-term ongoing cough productive not responsive to antibiotics we'll get chest x-ray and follow-up pending results.

## 2016-07-09 NOTE — Progress Notes (Signed)
BP 116/71 (BP Location: Left Arm, Patient Position: Sitting, Cuff Size: Normal)   Pulse 78   Temp 98.5 F (36.9 C)   Ht 4' 11.6" (1.514 m)   Wt 223 lb 3.2 oz (101.2 kg)   SpO2 97%   BMI 44.18 kg/m    Subjective:    Patient ID: Cynthia Jones, female    DOB: April 14, 1941, 75 y.o.   MRN: [id number]  HPI: Cynthia Jones is a 75 y.o. female  Chief Complaint  Patient presents with  . Diabetes    pt states last eye exam in chart in correct  . Hypertension   Patient doing well from diabetes and hypertension no complaints from medications taken faithfully without problems good control of both with no blood sugar excursions are low blood sugar spells.  Patient bothered by Brett Albino infection wants medicine for that but she is gotten from taking Augmentin. Patient's bronchitis coughing productive of thick green mucus which the patient showed me is still ongoing not being helped with Augmentin or antibiotics given in September. Patient still coughing and feeling bad. No fevers  Relevant past medical, surgical, family and social history reviewed and updated as indicated. Interim medical history since our last visit reviewed. Allergies and medications reviewed and updated.  Review of Systems  Constitutional: Positive for chills, diaphoresis and fatigue. Negative for fever.  HENT: Positive for congestion.   Respiratory: Positive for cough and wheezing. Negative for shortness of breath.   Cardiovascular: Negative.     Per HPI unless specifically indicated above     Objective:    BP 116/71 (BP Location: Left Arm, Patient Position: Sitting, Cuff Size: Normal)   Pulse 78   Temp 98.5 F (36.9 C)   Ht 4' 11.6" (1.514 m)   Wt 223 lb 3.2 oz (101.2 kg)   SpO2 97%   BMI 44.18 kg/m   Wt Readings from Last 3 Encounters:  07/09/16 223 lb 3.2 oz (101.2 kg)  06/10/16 221 lb (100.2 kg)  05/18/16 221 lb (100.2 kg)    Physical Exam  Constitutional: She is oriented to person, place,  and time. She appears well-developed and well-nourished. No distress.  HENT:  Head: Normocephalic and atraumatic.  Right Ear: Hearing normal.  Left Ear: Hearing normal.  Nose: Nose normal.  Eyes: Conjunctivae and lids are normal. Right eye exhibits no discharge. Left eye exhibits no discharge. No scleral icterus.  Cardiovascular: Normal rate, regular rhythm and normal heart sounds.   Pulmonary/Chest: Effort normal. No respiratory distress. She has no wheezes. She has no rales.  Some upper airway rhonchi with coughing  Musculoskeletal: Normal range of motion.  Neurological: She is alert and oriented to person, place, and time.  Skin: Skin is intact. No rash noted.  Psychiatric: She has a normal mood and affect. Her speech is normal and behavior is normal. Judgment and thought content normal. Cognition and memory are normal.    Results for orders placed or performed in visit on 04/08/16  Bayer DCA Hb A1c Waived  Result Value Ref Range   Bayer DCA Hb A1c Waived 6.6 <7.0 %  Urinalysis, Routine w reflex microscopic (not at [facility])  Result Value Ref Range   Specific Gravity, UA 1.015 1.005 - 1.030   pH, UA 5.5 5.0 - 7.5   Color, UA Yellow Yellow   Appearance Ur Clear Clear   Leukocytes, UA Negative Negative   Protein, UA Negative Negative/Trace   Glucose, UA Negative Negative   Ketones, UA Negative Negative  RBC, UA Negative Negative   Bilirubin, UA Negative Negative   Urobilinogen, Ur 0.2 0.2 - 1.0 mg/dL   Nitrite, UA Negative Negative  CBC with Differential/Platelet  Result Value Ref Range   WBC 8.6 3.4 - 10.8 x10E3/uL   RBC 4.29 3.77 - 5.28 x10E6/uL   Hemoglobin 13.2 11.1 - 15.9 g/dL   Hematocrit 62.8 31.5 - 46.6 %   MCV 92 79 - 97 fL   MCH 30.8 26.6 - 33.0 pg   MCHC 33.3 31.5 - 35.7 g/dL   RDW 17.6 16.0 - 73.7 %   Platelets 314 150 - 379 x10E3/uL   Neutrophils 56 %   Lymphs 31 %   Monocytes 9 %   Eos 3 %   Basos 1 %   Neutrophils Absolute 4.9 1.4 - 7.0 x10E3/uL    Lymphocytes Absolute 2.6 0.7 - 3.1 x10E3/uL   Monocytes Absolute 0.8 0.1 - 0.9 x10E3/uL   EOS (ABSOLUTE) 0.2 0.0 - 0.4 x10E3/uL   Basophils Absolute 0.1 0.0 - 0.2 x10E3/uL   Immature Granulocytes 0 %   Immature Grans (Abs) 0.0 0.0 - 0.1 x10E3/uL  Comprehensive metabolic panel  Result Value Ref Range   Glucose 164 (H) 65 - 99 mg/dL   BUN 12 8 - 27 mg/dL   Creatinine, Ser 1.06 0.57 - 1.00 mg/dL   GFR calc non Af Amer 79 >59 mL/min/1.73   GFR calc Af Amer 91 >59 mL/min/1.73   BUN/Creatinine Ratio 16 12 - 28   Sodium 140 134 - 144 mmol/L   Potassium 4.2 3.5 - 5.2 mmol/L   Chloride 101 96 - 106 mmol/L   CO2 22 18 - 29 mmol/L   Calcium 9.3 8.7 - 10.3 mg/dL   Total Protein 6.6 6.0 - 8.5 g/dL   Albumin 3.8 3.5 - 4.8 g/dL   Globulin, Total 2.8 1.5 - 4.5 g/dL   Albumin/Globulin Ratio 1.4 1.2 - 2.2   Bilirubin Total 0.3 0.0 - 1.2 mg/dL   Alkaline Phosphatase 50 39 - 117 IU/L   AST 15 0 - 40 IU/L   ALT 13 0 - 32 IU/L  Lipid Panel w/o Chol/HDL Ratio  Result Value Ref Range   Cholesterol, Total 174 100 - 199 mg/dL   Triglycerides 269 0 - 149 mg/dL   HDL 58 >48 mg/dL   VLDL Cholesterol Cal 30 5 - 40 mg/dL   LDL Calculated 86 0 - 99 mg/dL  TSH  Result Value Ref Range   TSH 7.600 (H) 0.450 - 4.500 uIU/mL  Hemoglobin A1c  Result Value Ref Range   Hemoglobin A1C 6.6       Assessment & Plan:   Problem List Items Addressed This Visit      Cardiovascular and Mediastinum   Essential hypertension    The current medical regimen is effective;  continue present plan and medications.         Endocrine   Diabetes mellitus (HCC) - Primary    The current medical regimen is effective;  continue present plan and medications.       Relevant Orders   Bayer DCA Hb A1c Waived     Other   Cough    Patient with long-term ongoing cough productive not responsive to antibiotics we'll get chest x-ray and follow-up pending results.      Relevant Orders   DG Chest 2 View       Follow up  plan: Return in about 3 months (around 10/09/2016) for Hemoglobin A1c, BMP,  Lipids,  ALT, AST.

## 2016-07-09 NOTE — Assessment & Plan Note (Signed)
The current medical regimen is effective;  continue present plan and medications.  

## 2016-07-27 ENCOUNTER — Telehealth: Payer: Self-pay

## 2016-07-27 NOTE — Telephone Encounter (Signed)
Dr. Vonita Moss states if we send him a fax stating that we cannot see the patient until January 9, he will write her oxycodone scripts until then. She was a Dr. Metta Clines patient so she got regular scripts here.  Call patient with questions.

## 2016-07-28 ENCOUNTER — Other Ambulatory Visit: Payer: Self-pay | Admitting: Family Medicine

## 2016-07-28 MED ORDER — OXYCODONE HCL 5 MG PO TABS: ORAL_TABLET | ORAL | 0 refills | Status: DC

## 2016-07-28 NOTE — Telephone Encounter (Signed)
Pt called stated she needs [name] to write an RX for Oxycodone until she can be seen by the Pain Clinic. Please call pt when RX is ready for pick up. Thanks.

## 2016-07-28 NOTE — Telephone Encounter (Signed)
Routing to provider.  Patient has an appointment at the pain clinic 09/08/2016

## 2016-07-29 ENCOUNTER — Telehealth: Payer: Self-pay | Admitting: Family Medicine

## 2016-07-29 NOTE — Telephone Encounter (Signed)
Spoke with Cynthia Jones at Dr. Valla Leaver office, informed them that patient has appointment with Dr. Laban Emperor on 09-09-15.

## 2016-07-29 NOTE — Telephone Encounter (Signed)
Dena from Dr. Waynetta Sandy office called stated the pt is not scheduled to be seen there until 09/08/16. Wants to know if [name] can write pt's RX for Oxycodone 5 mg until pt can be seen at the pain clinic. Thanks.

## 2016-08-03 NOTE — Telephone Encounter (Signed)
Yes I will Cynthia Jones

## 2016-09-08 ENCOUNTER — Encounter: Payer: Self-pay | Admitting: Pain Medicine

## 2016-09-08 ENCOUNTER — Ambulatory Visit: Payer: Medicare Other | Attending: Pain Medicine | Admitting: Pain Medicine

## 2016-09-08 ENCOUNTER — Ambulatory Visit
Admission: RE | Admit: 2016-09-08 | Discharge: 2016-09-08 | Disposition: A | Discharge status: Home / Self Care | Payer: Medicare Other | Source: Ambulatory Visit | Attending: Pain Medicine | Admitting: Pain Medicine

## 2016-09-08 VITALS — BP 146/60 | HR 70 | Temp 97.2°F | Resp 16 | Ht 60.0 in | Wt 221.0 lb

## 2016-09-08 DIAGNOSIS — M25561 Pain in right knee: Secondary | ICD-10-CM | POA: Diagnosis not present

## 2016-09-08 DIAGNOSIS — M1611 Unilateral primary osteoarthritis, right hip: Secondary | ICD-10-CM | POA: Diagnosis not present

## 2016-09-08 DIAGNOSIS — G894 Chronic pain syndrome: Secondary | ICD-10-CM

## 2016-09-08 DIAGNOSIS — M1612 Unilateral primary osteoarthritis, left hip: Secondary | ICD-10-CM | POA: Diagnosis not present

## 2016-09-08 DIAGNOSIS — M5481 Occipital neuralgia: Secondary | ICD-10-CM

## 2016-09-08 DIAGNOSIS — M1712 Unilateral primary osteoarthritis, left knee: Secondary | ICD-10-CM | POA: Diagnosis not present

## 2016-09-08 DIAGNOSIS — M533 Sacrococcygeal disorders, not elsewhere classified: Secondary | ICD-10-CM

## 2016-09-08 DIAGNOSIS — F329 Major depressive disorder, single episode, unspecified: Secondary | ICD-10-CM | POA: Diagnosis not present

## 2016-09-08 DIAGNOSIS — M25572 Pain in left ankle and joints of left foot: Secondary | ICD-10-CM | POA: Diagnosis not present

## 2016-09-08 DIAGNOSIS — G8929 Other chronic pain: Secondary | ICD-10-CM

## 2016-09-08 DIAGNOSIS — E669 Obesity, unspecified: Secondary | ICD-10-CM | POA: Insufficient documentation

## 2016-09-08 DIAGNOSIS — I2510 Atherosclerotic heart disease of native coronary artery without angina pectoris: Secondary | ICD-10-CM | POA: Insufficient documentation

## 2016-09-08 DIAGNOSIS — M7060 Trochanteric bursitis, unspecified hip: Secondary | ICD-10-CM | POA: Diagnosis not present

## 2016-09-08 DIAGNOSIS — M1710 Unilateral primary osteoarthritis, unspecified knee: Secondary | ICD-10-CM | POA: Diagnosis not present

## 2016-09-08 DIAGNOSIS — M8580 Other specified disorders of bone density and structure, unspecified site: Secondary | ICD-10-CM | POA: Insufficient documentation

## 2016-09-08 DIAGNOSIS — M1990 Unspecified osteoarthritis, unspecified site: Secondary | ICD-10-CM | POA: Diagnosis not present

## 2016-09-08 DIAGNOSIS — Z96659 Presence of unspecified artificial knee joint: Secondary | ICD-10-CM

## 2016-09-08 DIAGNOSIS — M545 Low back pain, unspecified: Secondary | ICD-10-CM | POA: Insufficient documentation

## 2016-09-08 DIAGNOSIS — M19041 Primary osteoarthritis, right hand: Secondary | ICD-10-CM | POA: Diagnosis not present

## 2016-09-08 DIAGNOSIS — M25562 Pain in left knee: Secondary | ICD-10-CM | POA: Insufficient documentation

## 2016-09-08 DIAGNOSIS — I700 Atherosclerosis of aorta: Secondary | ICD-10-CM | POA: Insufficient documentation

## 2016-09-08 DIAGNOSIS — M79672 Pain in left foot: Secondary | ICD-10-CM

## 2016-09-08 DIAGNOSIS — J45909 Unspecified asthma, uncomplicated: Secondary | ICD-10-CM | POA: Insufficient documentation

## 2016-09-08 DIAGNOSIS — J302 Other seasonal allergic rhinitis: Secondary | ICD-10-CM | POA: Insufficient documentation

## 2016-09-08 DIAGNOSIS — M159 Polyosteoarthritis, unspecified: Secondary | ICD-10-CM

## 2016-09-08 DIAGNOSIS — R51 Headache: Secondary | ICD-10-CM | POA: Insufficient documentation

## 2016-09-08 DIAGNOSIS — M79643 Pain in unspecified hand: Secondary | ICD-10-CM | POA: Insufficient documentation

## 2016-09-08 DIAGNOSIS — M189 Osteoarthritis of first carpometacarpal joint, unspecified: Secondary | ICD-10-CM | POA: Diagnosis not present

## 2016-09-08 DIAGNOSIS — M797 Fibromyalgia: Secondary | ICD-10-CM | POA: Diagnosis not present

## 2016-09-08 DIAGNOSIS — G4486 Cervicogenic headache: Secondary | ICD-10-CM

## 2016-09-08 DIAGNOSIS — M542 Cervicalgia: Secondary | ICD-10-CM | POA: Diagnosis not present

## 2016-09-08 DIAGNOSIS — M25559 Pain in unspecified hip: Secondary | ICD-10-CM | POA: Insufficient documentation

## 2016-09-08 DIAGNOSIS — I10 Essential (primary) hypertension: Secondary | ICD-10-CM | POA: Insufficient documentation

## 2016-09-08 DIAGNOSIS — M160 Bilateral primary osteoarthritis of hip: Secondary | ICD-10-CM | POA: Insufficient documentation

## 2016-09-08 DIAGNOSIS — M7732 Calcaneal spur, left foot: Secondary | ICD-10-CM | POA: Diagnosis not present

## 2016-09-08 DIAGNOSIS — M1288 Other specific arthropathies, not elsewhere classified, other specified site: Secondary | ICD-10-CM

## 2016-09-08 DIAGNOSIS — Z96651 Presence of right artificial knee joint: Secondary | ICD-10-CM | POA: Diagnosis not present

## 2016-09-08 DIAGNOSIS — M150 Primary generalized (osteo)arthritis: Secondary | ICD-10-CM

## 2016-09-08 DIAGNOSIS — F1390 Sedative, hypnotic, or anxiolytic use, unspecified, uncomplicated: Secondary | ICD-10-CM | POA: Diagnosis not present

## 2016-09-08 DIAGNOSIS — M179 Osteoarthritis of knee, unspecified: Secondary | ICD-10-CM | POA: Diagnosis not present

## 2016-09-08 DIAGNOSIS — M5136 Other intervertebral disc degeneration, lumbar region: Secondary | ICD-10-CM | POA: Diagnosis not present

## 2016-09-08 DIAGNOSIS — F1190 Opioid use, unspecified, uncomplicated: Secondary | ICD-10-CM

## 2016-09-08 DIAGNOSIS — M069 Rheumatoid arthritis, unspecified: Secondary | ICD-10-CM | POA: Insufficient documentation

## 2016-09-08 DIAGNOSIS — G4700 Insomnia, unspecified: Secondary | ICD-10-CM | POA: Insufficient documentation

## 2016-09-08 DIAGNOSIS — F419 Anxiety disorder, unspecified: Secondary | ICD-10-CM | POA: Insufficient documentation

## 2016-09-08 DIAGNOSIS — Z6841 Body Mass Index (BMI) 40.0 and over, adult: Secondary | ICD-10-CM | POA: Insufficient documentation

## 2016-09-08 DIAGNOSIS — Z79899 Other long term (current) drug therapy: Secondary | ICD-10-CM | POA: Diagnosis not present

## 2016-09-08 DIAGNOSIS — M47816 Spondylosis without myelopathy or radiculopathy, lumbar region: Secondary | ICD-10-CM

## 2016-09-08 DIAGNOSIS — M19042 Primary osteoarthritis, left hand: Secondary | ICD-10-CM | POA: Diagnosis not present

## 2016-09-08 DIAGNOSIS — E1140 Type 2 diabetes mellitus with diabetic neuropathy, unspecified: Secondary | ICD-10-CM | POA: Diagnosis not present

## 2016-09-08 DIAGNOSIS — J329 Chronic sinusitis, unspecified: Secondary | ICD-10-CM | POA: Insufficient documentation

## 2016-09-08 DIAGNOSIS — Z79891 Long term (current) use of opiate analgesic: Secondary | ICD-10-CM | POA: Diagnosis not present

## 2016-09-08 DIAGNOSIS — E1340 Other specified diabetes mellitus with diabetic neuropathy, unspecified: Secondary | ICD-10-CM

## 2016-09-08 DIAGNOSIS — L304 Erythema intertrigo: Secondary | ICD-10-CM | POA: Insufficient documentation

## 2016-09-08 NOTE — Progress Notes (Signed)
Safety precautions to be maintained throughout the outpatient stay will include: orient to surroundings, keep bed in low position, maintain call bell within reach at all times, provide assistance with transfer out of bed and ambulation.  

## 2016-09-08 NOTE — Progress Notes (Signed)
Patient's Name: Cynthia Jones  MRN: [id number]  Referring Provider: Guadalupe Maple, MD  DOB: December 02, 1940  PCP: Guadalupe Maple, MD  DOS: 09/08/2016  Note by: Kathlen Brunswick. Dossie Arbour, MD  Service setting: Ambulatory outpatient  Specialty: Interventional Pain Management  Location: [facility] (AMB) Pain Management Facility    Patient type: New Patient   Primary Reason(s) for Visit: Initial Patient Evaluation CC: Back Pain (lower); Neck Pain (both sides); Shoulder Pain (both ); Knee Pain (both); and Hand Pain (both)  HPI  [name] is a 76 y.o. year old, female patient, who comes today for an initial evaluation. She has Osteopenia; Depression; Essential hypertension; Diabetes mellitus ([place]); Degenerative joint disease of knee; Osteoarthritis; Seasonal allergic rhinitis; Obesity; Intertrigo; Fibromyalgia; Bilateral occipital neuralgia; Greater trochanteric bursitis; DJD (degenerative joint disease) of knee; Sacroiliac joint disease; Facet syndrome, lumbar; Neuropathy due to secondary diabetes ([place]); Total knee replacement status (Right); Folliculitis; Insomnia; Cough; Long term current use of opiate analgesic; Long term prescription opiate use; Opiate use; Long term prescription benzodiazepine use; Chronic pain syndrome; Chronic low back pain; Chronic knee pain (Bilateral); Cervicogenic headache; Hip pain, chronic, unspecified laterality; Chronic hand pain (B) (R>L); and Chronic foot pain, left on her problem list.. Her primarily concern today is the Back Pain (lower); Neck Pain (both sides); Shoulder Pain (both ); Knee Pain (both); and Hand Pain (both)  Pain Assessment: Self-Reported Pain Score: 5 /10 Clinically the patient looks like a 3/10 Reported level is inconsistent with clinical observations. Information on the proper use of the pain score provided to the patient today. Pain Type: Chronic pain Pain Location: Back Pain Orientation: Lower Pain Descriptors / Indicators: Aching, Dull Pain Frequency:  Intermittent  Onset and Duration: Gradual Cause of pain: Unknown Severity: Getting worse, NAS-11 at its worse: 9/10, NAS-11 at its best: 3/10, NAS-11 now: 8/10 and NAS-11 on the average: 7/10 Timing: Afternoon, Evening, Night, Not influenced by the time of the day and During activity or exercise Aggravating Factors: Bending, Kneeling, Motion, Prolonged sitting, Prolonged standing, Surgery made it worse, Walking, Walking uphill, Walking downhill and Working Alleviating Factors: Lying down, Medications, Resting and Sitting Associated Problems: Fatigue, Weakness, Pain that wakes patient up and Pain that does not allow patient to sleep Quality of Pain: Aching, Constant, Throbbing, Uncomfortable and Work related Previous Examinations or Tests: The patient denies previous test Previous Treatments: Narcotic medications  The patient comes into the clinics today for the first time for a chronic pain management evaluation. According to the patient the primary pain is that of the knees with the right one being worse than the left. She indicates having had a right total knee arthroplasty done in 2009 by Dr. Christophe Louis. She also admits to having had physical therapy around 2009 and having had left-sided knee blocks by Dr. Mohammed Kindle. The next worst pain is described to be that of the lower back but the left being worst on the right. She admits to having had back surgery by Dr. Christophe Louis and having had a lumbar epidural steroid injection also done by Dr. Tamala Julian. She denies any physical therapy. The third worst pain is described to be that of the neck with the right side being worst on the left. She denies any surgeries, nerve blocks, physical therapy. Next is her shoulder pain with the right being worst on the left and again she denies any surgery, nerve blocks, or physical therapy. Following this, she also indicates having pain in the left upper extremity which goes all  the way down to her middle  finger. She presents with osteoarthritis of her fingers with DJD of her distal joints in most of her fingers. Finally she describes pain in the area of the left foot, which she describes as a consequence of a "neuropathy". She denies any surgeries, nerve blocks, or physical therapy for this pain.  Today I took the time to provide the patient with information regarding my pain practice. The patient was informed that my practice is divided into two sections: an interventional pain management section, as well as a completely separate and distinct medication management section. The interventional portion of my practice takes place on Tuesdays and Thursdays, while the medication management is conducted on Mondays and Wednesdays. Because of the amount of documentation required on both them, they are kept separated. This means that there is the possibility that the patient may be scheduled for a procedure on Tuesday, while also having a medication management appointment on Wednesday. I have also informed the patient that because of current staffing and facility limitations, I no longer take patients for medication management only. To illustrate the reasons for this, I gave the patient the example of a surgeon and how inappropriate it would be to refer a patient to his/her practice so that they write for the post-procedure antibiotics on a surgery done by someone else.   The patient was informed that joining my practice means that they are open to any and all interventional therapies. I clarified for the patient that this does not mean that they will be forced to have any procedures done. What it means is that patients looking for a practitioner to simply write for their pain medications and not take advantage of other interventional techniques will be better served by a different practitioner, other than myself. I made it clear that I prefer to spend my time providing those services that I specialize in.  The patient  was also made aware of my Comprehensive Pain Management Safety Guidelines where by joining my practice, they limit all of their nerve blocks and joint injections to those done by our practice, for as long as we are retained to manage their care.   Historic Controlled Substance Pharmacotherapy Review  PMP and historical list of controlled substances: Oxycodone 5 mg; alprazolam 0.25 mg; tramadol 50 mg; oxycodone/APAP 5/325; hydrocodone/APAP 10/650. Highest analgesic regimen found: Oxycodone/APAP 5/325 one tablet by mouth 3 times a day Most recent analgesic: Oxycodone IR 5 mg 1 tablet by mouth twice a day Highest recorded MME/day: 22.5 mg/day MME/day: 15 mg/day Medications: The patient did not bring the medication(s) to the appointment, as requested in our "New Patient Package" Pharmacodynamics: Desired effects: Analgesia: The patient reports >50% benefit. Reported improvement in function: The patient reports medication allows her to accomplish basic ADLs. Clinically meaningful improvement in function (CMIF): Sustained CMIF goals met Perceived effectiveness: Described as relatively effective, allowing for increase in activities of daily living (ADL) Undesirable effects: Side-effects or Adverse reactions: None reported Historical Monitoring: The patient  reports that she does not use drugs.. No results found for: MDMA, COCAINSCRNUR, PCPSCRNUR, THCU, ETH Historical Background Evaluation: [place] PDMP: Six (6) year initial data search conducted.             [place] Department of public safety, offender search: Editor, commissioning Information) Non-contributory Risk Assessment Profile: Aberrant behavior: None observed or detected today Risk factors for fatal opioid overdose: None identified today Fatal overdose hazard ratio (HR): Calculation deferred Non-fatal overdose hazard ratio (HR): Calculation deferred Risk  of opioid abuse or dependence: 0.7-3.0% with doses ? 36 MME/day and 6.1-26% with doses ? 120  MME/day. Substance use disorder (SUD) risk level: Low, according to Dr. Isac Sarna evaluation. Opioid risk tool (ORT) (Total Score): 3  ORT Scoring interpretation table:  Score <3 = Low Risk for SUD  Score between 4-7 = Moderate Risk for SUD  Score >8 = High Risk for Opioid Abuse   PHQ-2 Depression Scale:  Total score: 0  PHQ-2 Scoring interpretation table: (Score and probability of major depressive disorder)  Score 0 = No depression  Score 1 = 15.4% Probability  Score 2 = 21.1% Probability  Score 3 = 38.4% Probability  Score 4 = 45.5% Probability  Score 5 = 56.4% Probability  Score 6 = 78.6% Probability   PHQ-9 Depression Scale:  Total score: 0  PHQ-9 Scoring interpretation table:  Score 0-4 = No depression  Score 5-9 = Mild depression  Score 10-14 = Moderate depression  Score 15-19 = Moderately severe depression  Score 20-27 = Severe depression (2.4 times higher risk of SUD and 2.89 times higher risk of overuse)   Pharmacologic Plan: Pending ordered tests and/or consults  Meds  The patient has a current medication list which includes the following prescription(s): alprazolam, amoxicillin-clavulanate, carvedilol, duloxetine, esomeprazole, loratadine, losartan, and oxycodone.  Current Outpatient Prescriptions on File Prior to Visit  Medication Sig  . ALPRAZolam (XANAX) 0.25 MG tablet Take 1 tablet (0.25 mg total) by mouth 2 (two) times daily as needed for anxiety.  [name] amoxicillin-clavulanate (AUGMENTIN) 875-125 MG tablet Take 1 tablet by mouth 2 (two) times daily. (Patient taking differently: Take 1 tablet by mouth 2 (two) times daily. )  . carvedilol (COREG) 12.5 MG tablet Take 1 tablet (12.5 mg total) by mouth 2 (two) times daily with a meal.  . DULoxetine (CYMBALTA) 60 MG capsule Take 1 capsule (60 mg total) by mouth daily.  [name] esomeprazole (NEXIUM) 40 MG capsule Take 40 mg by mouth daily at 12 noon.  . loratadine (CLARITIN) 10 MG tablet Take 1 tablet (10 mg total) by  mouth daily. (Patient taking differently: Take 10 mg by mouth as needed. )  . losartan (COZAAR) 100 MG tablet Take 1 tablet (100 mg total) by mouth daily.  [name] oxyCODONE (OXY IR/ROXICODONE) 5 MG immediate release tablet Limit 1 tab by mouth per day or twice per day if tolerated   No current facility-administered medications on file prior to visit.    Imaging Review  Knee Imaging: Knee-R DG 1-2 views:  Results for orders placed in visit on 03/21/08  DG Knee 1-2 Views Right   Narrative * PRIOR REPORT IMPORTED FROM AN EXTERNAL SYSTEM *   PRIOR REPORT IMPORTED FROM THE [employer] WORKFLOW SYSTEM   REASON FOR EXAM:    total  knee  COMMENTS:   PROCEDURE:     DXR - DXR KNEE RIGHT AP AND LATERAL  - Mar 21 2008  9:56AM   RESULT:     The patient is status post total RIGHT knee prostheses  placement. Hardware and native osseous structures appear intact. Skin  staples and surgical drains are appreciated along the knee.   IMPRESSION:   Postop RIGHT knee replacement as described above. The remainder of the  interpretation will be left to the performing physician.   Thank you for the opportunity to contribute to the care of your patient.       Note: Available results from prior imaging studies were reviewed.  ROS  Cardiovascular History: Needs antibiotics prior to dental procedures Pulmonary or Respiratory History: Negative for bronchial asthma, emphysema, chronic smoking, chronic bronchitis, sarcoidosis, tuberculosis or sleep apena Neurological History: Negative for epilepsy, stroke, urinary or fecal inontinence, spina bifida or tethered cord syndrome Review of Past Neurological Studies: No results found for this or any previous visit. Psychological-Psychiatric History: Anxiety Gastrointestinal History: Ulcers Genitourinary History: Negative for nephrolithiasis, hematuria, renal failure or chronic kidney disease Hematological History: Negative for anticoagulant therapy, anemia, bruising  or bleeding easily, hemophilia, sickle cell disease or trait, thrombocytopenia or coagulupathies Endocrine History: Non-insulin-dependent diabetes mellitus Rheumatologic History: Osteoarthritis, Rheumatoid arthritis and Fibromyalgia Musculoskeletal History: Negative for myasthenia gravis, muscular dystrophy, multiple sclerosis or malignant hyperthermia Work History: Retired  Allergies  Ms. Fitzsimmons is allergic to nsaids and erythromycin.  Laboratory Chemistry  Inflammation Markers No results found for: ESRSEDRATE, CRP Renal Function Lab Results  Component Value Date   BUN 12 04/08/2016   CREATININE 0.75 04/08/2016   GFRAA 91 04/08/2016   GFRNONAA 79 04/08/2016   Hepatic Function Lab Results  Component Value Date   AST 15 04/08/2016   ALT 13 04/08/2016   ALBUMIN 3.8 04/08/2016   Electrolytes Lab Results  Component Value Date   NA 140 04/08/2016   K 4.2 04/08/2016   CL 101 04/08/2016   CALCIUM 9.3 04/08/2016   Pain Modulating Vitamins No results found for: Maralyn Sago [id number], [id number], [id number], 25OHVITD1, 25OHVITD2, 25OHVITD3, VITAMINB12 Coagulation Parameters Lab Results  Component Value Date   PLT 314 04/08/2016   Cardiovascular Lab Results  Component Value Date   HCT 39.6 04/08/2016   Note: Lab results reviewed.  [place]  Drug: Ms. Gallaga  reports that she does not use drugs. Alcohol:  reports that she does not drink alcohol. Tobacco:  reports that she has never smoked. She has never used smokeless tobacco. Medical:  has a past medical history of Acute anxiety (03/06/2015); Allergy; Anxiety; Degenerative joint disease of knee; Depression; Fibromyalgia; Obesity; Osteopenia; and RAD (reactive airway disease). Family: family history includes Alzheimer's disease in her father; Cancer in her sister; Diabetes in her mother; Heart disease in her mother; Parkinson's disease in her father; Stroke in her mother.  Past Surgical History:  Procedure Laterality Date  .  ABDOMINAL HYSTERECTOMY    . APPENDECTOMY    . CHOLECYSTECTOMY    . EYE SURGERY    . JOINT REPLACEMENT  2014   right knee  . PAROTIDECTOMY Left   . TONSILLECTOMY     Active Ambulatory Problems    Diagnosis Date Noted  . Osteopenia 03/06/2015  . Depression 03/06/2015  . Essential hypertension 03/06/2015  . Diabetes mellitus ([place]) 03/06/2015  . Degenerative joint disease of knee 03/06/2015  . Osteoarthritis 03/06/2015  . Seasonal allergic rhinitis 03/06/2015  . Obesity 03/06/2015  . Intertrigo 03/06/2015  . Fibromyalgia 04/09/2015  . Bilateral occipital neuralgia 04/09/2015  . Greater trochanteric bursitis 04/09/2015  . DJD (degenerative joint disease) of knee 04/09/2015  . Sacroiliac joint disease 04/09/2015  . Facet syndrome, lumbar 04/09/2015  . Neuropathy due to secondary diabetes ([place]) 04/09/2015  . Total knee replacement status (Right) 04/17/2015  . Folliculitis 32/20/2542  . Insomnia 09/17/2015  . Cough 07/09/2016  . Long term current use of opiate analgesic 09/08/2016  . Long term prescription opiate use 09/08/2016  . Opiate use 09/08/2016  . Long term prescription benzodiazepine use 09/08/2016  . Chronic pain syndrome 09/08/2016  . Chronic low back pain 09/08/2016  . Chronic knee pain (Bilateral) 09/08/2016  .  Cervicogenic headache 09/08/2016  . Hip pain, chronic, unspecified laterality 09/08/2016  . Chronic hand pain (B) (R>L) 09/08/2016  . Chronic foot pain, left 09/08/2016   Resolved Ambulatory Problems    Diagnosis Date Noted  . Acute anxiety 03/06/2015  . Sinusitis 08/05/2015   Past Medical History:  Diagnosis Date  . Acute anxiety 03/06/2015  . Allergy   . Anxiety   . Degenerative joint disease of knee   . Depression   . Fibromyalgia   . Obesity   . Osteopenia   . RAD (reactive airway disease)    Constitutional Exam  General appearance: Well nourished, well developed, and well hydrated. In no apparent acute distress Vitals:   09/08/16 0944   BP: (!) 146/60  Pulse: 70  Resp: 16  Temp: 97.2 F (36.2 C)  TempSrc: Temporal  SpO2: 100%  Weight: 221 lb (100.2 kg)  Height: 5' (1.524 m)   BMI Assessment: Estimated body mass index is 43.16 kg/m as calculated from the following:   Height as of this encounter: 5' (1.524 m).   Weight as of this encounter: 221 lb (100.2 kg).  BMI interpretation table: BMI level Category Range association with higher incidence of chronic pain  <18 kg/m2 Underweight   18.5-24.9 kg/m2 Ideal body weight   25-29.9 kg/m2 Overweight Increased incidence by 20%  30-34.9 kg/m2 Obese (Class I) Increased incidence by 68%  35-39.9 kg/m2 Severe obesity (Class II) Increased incidence by 136%  >40 kg/m2 Extreme obesity (Class III) Increased incidence by 254%   BMI Readings from Last 4 Encounters:  09/08/16 43.16 kg/m  07/09/16 44.18 kg/m  06/10/16 43.16 kg/m  05/18/16 43.16 kg/m   Wt Readings from Last 4 Encounters:  09/08/16 221 lb (100.2 kg)  07/09/16 223 lb 3.2 oz (101.2 kg)  06/10/16 221 lb (100.2 kg)  05/18/16 221 lb (100.2 kg)  Psych/Mental status: Alert, oriented x 3 (person, place, & time) Eyes: PERLA Respiratory: No evidence of acute respiratory distress  Cervical Spine Exam  Inspection: No masses, redness, or swelling Alignment: Symmetrical Functional ROM: Adequate ROM Stability: No instability detected Muscle strength & Tone: Functionally intact Sensory: Movement-associated discomfort Palpation: Complains of area being tender to palpation  Upper Extremity (UE) Exam    Side: Right upper extremity  Side: Left upper extremity  Inspection: No masses, redness, swelling, or asymmetry  Inspection: No masses, redness, swelling, or asymmetry  Functional ROM: Unrestricted ROM          Functional ROM: Unrestricted ROM          Muscle strength & Tone: Functionally intact  Muscle strength & Tone: Functionally intact  Sensory: Unimpaired  Sensory: Unimpaired  Palpation: Non-contributory   Palpation: Non-contributory   Thoracic Spine Exam  Inspection: No masses, redness, or swelling Alignment: Symmetrical Functional ROM: Unrestricted ROM Stability: No instability detected Sensory: Unimpaired Muscle strength & Tone: Functionally intact Palpation: Non-contributory  Lumbar Spine Exam  Inspection: No masses, redness, or swelling Alignment: Symmetrical Functional ROM: Unrestricted ROM Stability: No instability detected Muscle strength & Tone: Functionally intact Sensory: Unimpaired Palpation: Non-contributory Provocative Tests: Lumbar Hyperextension and rotation test: evaluation deferred today       Patrick's Maneuver: evaluation deferred today              Gait & Posture Assessment  Ambulation: Unassisted Gait: Relatively normal for age and body habitus Posture: WNL   Lower Extremity Exam    Side: Right lower extremity  Side: Left lower extremity  Inspection: No masses, redness, swelling, or  asymmetry  Inspection: No masses, redness, swelling, or asymmetry  Functional ROM: Unrestricted ROM          Functional ROM: Unrestricted ROM          Muscle strength & Tone: Functionally intact  Muscle strength & Tone: Functionally intact  Sensory: Unimpaired  Sensory: Unimpaired  Palpation: Non-contributory  Palpation: Non-contributory   Assessment  Primary Diagnosis & Pertinent Problem List: The primary encounter diagnosis was Chronic pain syndrome. Diagnoses of Long term current use of opiate analgesic, Long term prescription benzodiazepine use, Long term prescription opiate use, Opiate use, Neuropathy due to secondary diabetes ([place]), Osteoarthritis, Chronic low back pain, Chronic knee pain (Bilateral), Bilateral occipital neuralgia, Facet syndrome, lumbar, Greater trochanteric bursitis, unspecified laterality, Sacroiliac joint disease, Status post total knee replacement, unspecified laterality, Cervicogenic headache, Hip pain, chronic, unspecified laterality, Chronic hand  pain, unspecified laterality, and Chronic foot pain, left were also pertinent to this visit.  Visit Diagnosis: 1. Chronic pain syndrome   2. Long term current use of opiate analgesic   3. Long term prescription benzodiazepine use   4. Long term prescription opiate use   5. Opiate use   6. Neuropathy due to secondary diabetes ([place])   7. Osteoarthritis   8. Chronic low back pain   9. Chronic knee pain (Bilateral)   10. Bilateral occipital neuralgia   11. Facet syndrome, lumbar   12. Greater trochanteric bursitis, unspecified laterality   13. Sacroiliac joint disease   14. Status post total knee replacement, unspecified laterality   15. Cervicogenic headache   16. Hip pain, chronic, unspecified laterality   17. Chronic hand pain, unspecified laterality   18. Chronic foot pain, left    Plan of Care  Initial treatment plan:  Please be advised that as per protocol, today's visit has been an evaluation only. We have not taken over the patient's controlled substance management.  Problem-specific plan: No problem-specific Assessment & Plan notes found for this encounter.  Ordered Lab-work, Procedure(s), Referral(s), & Consult(s): Orders Placed This Encounter  Procedures  . DG Cervical Spine Complete  . DG Lumbar Spine Complete W/Bend  . DG Si Joints  . DG Knee 1-2 Views Left  . DG Knee 1-2 Views Right  . DG HIP UNILAT W OR W/O PELVIS 2-3 VIEWS LEFT  . DG HIP UNILAT W OR W/O PELVIS 2-3 VIEWS RIGHT  . DG Hand Complete Left  . DG Hand Complete Right  . DG Foot Complete Left  . Compliance Drug Analysis, Ur  . Comprehensive metabolic panel  . C-reactive protein  . Magnesium  . Sedimentation rate  . Vitamin B12  . 25-Hydroxyvitamin D Lcms D2+D3  . NCV with EMG(electromyography)   Pharmacotherapy: Medications ordered:  No orders of the defined types were placed in this encounter.  Medications administered during this visit: Ms. Tietje had no medications administered during  this visit.   Pharmacotherapy under consideration:  Opioid Analgesics: The patient was informed that there is no guarantee that she would be a candidate for opioid analgesics. The decision will be made following CDC guidelines. This decision will be based on the results of diagnostic studies, as well as Ms. Biedermann's risk profile.  Membrane stabilizer: To be determined at a later time Muscle relaxant: To be determined at a later time NSAID: To be determined at a later time Other analgesic(s): To be determined at a later time   Interventional therapies under consideration: Ms. Furrow was informed that there is no guarantee that she would  be a candidate for interventional therapies. The decision will be based on the results of diagnostic studies, as well as Ms. Cruickshank's risk profile.  Possible procedure(s): Diagnostic right Genicular nerve block under fluoroscopic guidance and IV sedation  Possible right Genicular nerve radiofrequency ablation  Diagnostic left intra-articular knee joint injection  Possible series of 5 left intra-articular Hyalgan knee injections  Diagnostic left Genicular nerve block  Possible left Genicular nerve RFA  Diagnostic bilateral lumbar facet block under fluoroscopic guidance and IV sedation  Possible bilateral lumbar facet RFA  Diagnostic left lumbar epidural steroid injection under fluoroscopic guidance  Diagnostic caudal epidural steroid injection + epidurogram  Possible Racz procedure  Diagnostic bilateral cervical facet block under fluoroscopic guidance and IV sedation  Possible bilateral cervical facet RFA  Diagnostic right versus left cervical epidural steroid injection under fluoroscopic guidance  Diagnostic bilateral intra-articular shoulder joint injection  Diagnostic bilateral suprascapular nerve block  Possible bilateral suprascapular nerve RFA    Provider-requested follow-up: Return in about 2 weeks (around 09/22/2016) for 2nd  Visit.  Future Appointments Date Time Provider [place]  10/06/2016 8:45 AM Milinda Pointer, MD [facility] None    Primary Care Physician: Guadalupe Maple, MD Location: [facility] Outpatient Pain Management Facility Note by: Kathlen Brunswick. Dossie Arbour, M.D, DABA, DABAPM, DABPM, DABIPP, FIPP Date: 09/08/16; Time: 11:34 AM  Pain Score Disclaimer: We use the NRS-11 scale. This is a self-reported, subjective measurement of pain severity with only modest accuracy. It is used primarily to identify changes within a particular patient. It must be understood that outpatient pain scales are significantly less accurate that those used for research, where they can be applied under ideal controlled circumstances with minimal exposure to variables. In reality, the score is likely to be a combination of pain intensity and pain affect, where pain affect describes the degree of emotional arousal or changes in action readiness caused by the sensory experience of pain. Factors such as social and work situation, setting, emotional state, anxiety levels, expectation, and prior pain experience may influence pain perception and show large inter-individual differences that may also be affected by time variables.  Patient instructions provided during this appointment: There are no Patient Instructions on file for this visit.

## 2016-09-13 LAB — COMPLIANCE DRUG ANALYSIS, UR

## 2016-09-14 ENCOUNTER — Encounter: Payer: Self-pay | Admitting: Family Medicine

## 2016-09-14 DIAGNOSIS — E7800 Pure hypercholesterolemia, unspecified: Secondary | ICD-10-CM | POA: Insufficient documentation

## 2016-09-24 ENCOUNTER — Ambulatory Visit: Payer: Medicare Other | Attending: Pain Medicine | Admitting: Pain Medicine

## 2016-09-24 ENCOUNTER — Encounter: Payer: Self-pay | Admitting: Pain Medicine

## 2016-09-24 ENCOUNTER — Other Ambulatory Visit
Admission: RE | Admit: 2016-09-24 | Discharge: 2016-09-24 | Disposition: A | Discharge status: Home / Self Care | Payer: Medicare Other | Source: Ambulatory Visit | Attending: Pain Medicine | Admitting: Pain Medicine

## 2016-09-24 VITALS — BP 171/67 | HR 76 | Temp 98.5°F | Resp 18 | Ht 60.0 in | Wt 222.0 lb

## 2016-09-24 DIAGNOSIS — M1712 Unilateral primary osteoarthritis, left knee: Secondary | ICD-10-CM

## 2016-09-24 DIAGNOSIS — F1190 Opioid use, unspecified, uncomplicated: Secondary | ICD-10-CM | POA: Diagnosis not present

## 2016-09-24 DIAGNOSIS — M797 Fibromyalgia: Secondary | ICD-10-CM

## 2016-09-24 DIAGNOSIS — M533 Sacrococcygeal disorders, not elsewhere classified: Secondary | ICD-10-CM

## 2016-09-24 DIAGNOSIS — G8929 Other chronic pain: Secondary | ICD-10-CM

## 2016-09-24 DIAGNOSIS — G894 Chronic pain syndrome: Secondary | ICD-10-CM

## 2016-09-24 DIAGNOSIS — R51 Headache: Secondary | ICD-10-CM | POA: Diagnosis not present

## 2016-09-24 DIAGNOSIS — Z79891 Long term (current) use of opiate analgesic: Secondary | ICD-10-CM

## 2016-09-24 DIAGNOSIS — M25562 Pain in left knee: Secondary | ICD-10-CM

## 2016-09-24 DIAGNOSIS — G4486 Cervicogenic headache: Secondary | ICD-10-CM

## 2016-09-24 DIAGNOSIS — M545 Low back pain: Secondary | ICD-10-CM

## 2016-09-24 DIAGNOSIS — M25561 Pain in right knee: Secondary | ICD-10-CM

## 2016-09-24 DIAGNOSIS — M47816 Spondylosis without myelopathy or radiculopathy, lumbar region: Secondary | ICD-10-CM

## 2016-09-24 DIAGNOSIS — M1288 Other specific arthropathies, not elsewhere classified, other specified site: Secondary | ICD-10-CM

## 2016-09-24 LAB — C-REACTIVE PROTEIN: CRP: 1.1 mg/dL — AB (ref <1.0)

## 2016-09-24 LAB — MAGNESIUM: MAGNESIUM: 2.1 mg/dL (ref 1.7–2.4)

## 2016-09-24 LAB — SEDIMENTATION RATE: SED RATE: 44 mm/h — AB (ref 0–30)

## 2016-09-24 LAB — VITAMIN B12: VITAMIN B 12: 344 pg/mL (ref 180–914)

## 2016-09-24 MED ORDER — OXYCODONE HCL 5 MG PO TABS: 5.0000 mg | ORAL_TABLET | Freq: Two times a day (BID) | ORAL | 0 refills | Status: DC

## 2016-09-24 NOTE — Progress Notes (Signed)
Patient's Name: Cynthia Jones  MRN: [id number]  Referring Provider: Guadalupe Maple, MD  DOB: Jul 04, 1941  PCP: Cynthia Maple, MD  DOS: 09/24/2016  Note by: Kathlen Brunswick. Dossie Arbour, MD  Service setting: Ambulatory outpatient  Specialty: Interventional Pain Management  Location: [facility] (AMB) Pain Management Facility    Patient type: Established   Primary Reason(s) for Visit: Encounter for evaluation before starting new chronic pain management plan of care (Level of risk: moderate) CC: Neck Pain; Knee Pain; and Back Pain  HPI  Cynthia Jones is a 76 y.o. year old, female patient, who comes today for a follow-up evaluation to review the test results and decide on a treatment plan. She has Osteopenia; Depression; Essential hypertension; Diabetes mellitus ([place]); Degenerative joint disease of knee; Osteoarthritis; Seasonal allergic rhinitis; Obesity; Intertrigo; Fibromyalgia; Bilateral occipital neuralgia; Greater trochanteric bursitis; Osteoarthritis of knee (Location of Primary Source of Pain) (Left); Sacroiliac joint disease; Facet syndrome, lumbar; Neuropathy due to secondary diabetes ([place]); Total knee replacement status (Right); Folliculitis; Insomnia; Cough; Long term current use of opiate analgesic; Long term prescription opiate use; Opiate use; Long term prescription benzodiazepine use; Chronic pain syndrome; Chronic low back pain (Location of Secondary source of pain) (Bilateral) (L>R); Chronic knee pain (Location of Primary Source of Pain) (Bilateral) (L>R); Cervicogenic headache; Hip pain, chronic, unspecified laterality; Chronic hand pain (B) (R>L); Chronic foot pain, left; and Hypercholesteremia on her problem list. Her primarily concern today is the Neck Pain; Knee Pain; and Back Pain  Pain Assessment: Self-Reported Pain Score: 6 /10 Clinically the patient looks like a 2/10 Reported level is inconsistent with clinical observations. Information on the proper use of the pain scale provided to the  patient today Pain Type: Chronic pain Pain Location:  (back) Pain Orientation:  (back of neck) Pain Descriptors / Indicators: Aching, Dull Pain Frequency: Constant  Cynthia Jones comes in today for a follow-up visit after her initial evaluation on 09/08/2016. Today we went over the results of her tests. These were explained in "Layman's terms". During today's appointment we went over my diagnostic impression, as well as the proposed treatment plan.  In considering the treatment plan options, Cynthia Jones was reminded that I no longer take patients for medication management only. I asked her to let me know if she had no intention of taking advantage of the interventional therapies, so that we could make arrangements to provide this space to someone interested. I also made it clear that undergoing interventional therapies for the purpose of getting pain medications is very inappropriate on the part of a patient, and it will not be tolerated in this practice. This type of behavior would suggest true addiction and therefore it requires referral to an addiction specialist.   Further details on both, my assessment(s), as well as the proposed treatment plan, please see below. Controlled Substance Pharmacotherapy Assessment REMS (Risk Evaluation and Mitigation Strategy)  Analgesic: Oxycodone IR 5 mg 1 tablet by mouth twice a day MME/day: 15 mg/day Pill Count: None expected due to no prior prescriptions written by our practice. Pharmacokinetics: Liberation and absorption (onset of action): WNL Distribution (time to peak effect): WNL Metabolism and excretion (duration of action): WNL         Pharmacodynamics: Desired effects: Analgesia: Cynthia Jones reports >50% benefit. Functional ability: Patient reports that medication allows her to accomplish basic ADLs Clinically meaningful improvement in function (CMIF): Sustained CMIF goals met Perceived effectiveness: Described as relatively effective,  allowing for increase in activities of daily living (ADL) Undesirable  effects: Side-effects or Adverse reactions: None reported Monitoring: [place] PMP: Online review of the past 76-monthperiod previously conducted. Not applicable at this point since we have not taken over the patient's medication management yet. List of all UDS test(s) done:  Lab Results  Component Value Date   TOXASSSELUR FINAL 02/06/2016   SUMMARY FINAL 09/08/2016   Last UDS on record: ToxAssure Select 13  Date Value Ref Range Status  02/06/2016 FINAL  Final    Comment:    ==================================================================== TOXASSURE SELECT 13 (MW) ==================================================================== Test                             Result       Flag       Units Drug Present and Declared for Prescription Verification   Oxycodone                      650          EXPECTED   ng/mg creat   Noroxycodone                   1312         EXPECTED   ng/mg creat    Sources of oxycodone include scheduled prescription medications.    Noroxycodone is an expected metabolite of oxycodone. ==================================================================== Test                      Result    Flag   Units      Ref Range   Creatinine              98               mg/dL      >=20 ==================================================================== Declared Medications:  The flagging and interpretation on this report are based on the  following declared medications.  Unexpected results may arise from  inaccuracies in the declared medications.  **Note: The testing scope of this panel includes these medications:  Oxycodone (Roxicodone)  **Note: The testing scope of this panel does not include following  reported medications:  Carvedilol  Ciprofloxacin  Cyproheptadine  Duloxetine  Fluconazole  Losartan (Losartan Potassium)  Methylprednisolone  Nystatin   Omeprazole ==================================================================== For clinical consultation, please call ([phone] ====================================================================    Summary  Date Value Ref Range Status  09/08/2016 FINAL  Final    Comment:    ==================================================================== TOXASSURE COMP DRUG ANALYSIS,UR ==================================================================== Test                             Result       Flag       Units Drug Present   Oxycodone                      944                     ng/mg creat   Noroxycodone                   1623                    ng/mg creat    Sources of oxycodone include scheduled prescription medications.    Noroxycodone is an expected metabolite of oxycodone.   Duloxetine  PRESENT ==================================================================== Test                      Result    Flag   Units      Ref Range   Creatinine              86               mg/dL      >=20 ==================================================================== Declared Medications:  Medication list was not provided. ==================================================================== For clinical consultation, please call [phone]. ====================================================================    UDS interpretation: No unexpected findings.          Medication Assessment Form: Patient introduced to form today Treatment compliance: Treatment may start today if patient agrees with proposed plan. Evaluation of compliance is not applicable at this point Risk Assessment Profile: Aberrant behavior: See initial evaluations. None observed or detected today Comorbid factors increasing risk of overdose: See initial evaluation. No additional risks detected today Risk Mitigation Strategies:  Patient opioid safety counseling: Completed today. Counseling provided  to patient as per "Patient Counseling Document". Document signed by patient, attesting to counseling and understanding Patient-Prescriber Agreement (PPA): Obtained today  Controlled substance notification to other providers: Written and sent today  Pharmacologic Plan: Today we may be taking over the patient's pharmacological regimen. See below  Laboratory Chemistry  Inflammation Markers Lab Results  Component Value Date   ESRSEDRATE 44 (H) 09/24/2016   CRP 1.1 (H) 09/24/2016   Renal Function Lab Results  Component Value Date   BUN 12 04/08/2016   CREATININE 0.75 04/08/2016   GFRAA 91 04/08/2016   GFRNONAA 79 04/08/2016   Hepatic Function Lab Results  Component Value Date   AST 15 04/08/2016   ALT 13 04/08/2016   ALBUMIN 3.8 04/08/2016   Electrolytes Lab Results  Component Value Date   NA 140 04/08/2016   K 4.2 04/08/2016   CL 101 04/08/2016   CALCIUM 9.3 04/08/2016   MG 2.1 09/24/2016   Pain Modulating Vitamins Lab Results  Component Value Date   VITAMINB12 344 09/24/2016   Coagulation Parameters Lab Results  Component Value Date   PLT 314 04/08/2016   Cardiovascular Lab Results  Component Value Date   HCT 39.6 04/08/2016   Note: Lab results reviewed.  Recent Diagnostic Imaging Review  Dg Cervical Spine Complete Result Date: 09/08/2016 CLINICAL DATA:  Neck pain for 2 years.  Fibromyalgia.  Osteopenia. EXAM: CERVICAL SPINE - COMPLETE 4+ VIEW COMPARISON:  None. FINDINGS: The cervical spine is visualized the skullbase through the cervicothoracic junction. Grade 1 degenerative anterolisthesis is evident at C4-5 C5-6, and C6-7. Vertebral body heights are maintained. Uncovertebral spurring an osseous foraminal narrowing is worse on the left at C4-5 and C5-6. Atherosclerotic calcifications are also worse on the left. The lung apices are clear. IMPRESSION: 1. Multilevel spondylosis of the cervical spine. 2. Osseous foraminal narrowing due to uncovertebral disease is  greatest on the left at C4-5 and C5-6. 3. Carotid atherosclerosis. Electronically Signed   By: San Morelle M.D.   On: 09/08/2016 14:25   Dg Lumbar Spine Complete W/bend Result Date: 09/08/2016 CLINICAL DATA:  Chronic lower back pain without known injury. EXAM: LUMBAR SPINE - COMPLETE WITH BENDING VIEWS COMPARISON:  None. FINDINGS: No fracture or spondylolisthesis is noted. No change in vertebral body alignment is noted on the flexion or extension views. Mild degenerative disc disease is noted at L1-2 and L4-5. Moderate degenerative disc disease is noted at L5-S1. Atherosclerosis of abdominal aorta  is noted. IMPRESSION: Multilevel degenerative disc disease. No acute abnormality seen in the lumbar spine. Electronically Signed   By: Marijo Conception, M.D.   On: 09/08/2016 14:29   Dg Si Joints Result Date: 09/08/2016 CLINICAL DATA:  Lower back pain for 10 years without known injury. EXAM: BILATERAL SACROILIAC JOINTS - 3+ VIEW COMPARISON:  None. FINDINGS: The sacroiliac joint spaces are maintained and there is no evidence of arthropathy. No other bone abnormalities are seen. IMPRESSION: Normal sacroiliac joints. Electronically Signed   By: Marijo Conception, M.D.   On: 09/08/2016 14:26   Dg Knee 1-2 Views Left Result Date: 09/08/2016 CLINICAL DATA:  Joint pain, no trauma EXAM: LEFT KNEE - 1-2 VIEW COMPARISON:  None FINDINGS: There is primarily bicompartmental degenerative joint disease of the left knee mainly involving the medial compartment where there is more loss of joint space, sclerosis, and spurring. Degenerative change also involves the patellofemoral articulation. The lateral compartment is relatively well preserved. No fracture is seen. No joint effusion is noted. Soft tissue calcifications are noted in the soft tissues just inferior to the tip of the patella. IMPRESSION: Bicompartmental degenerative joint disease of the left knee primarily involving the medial compartment. No fracture or joint  effusion. Electronically Signed   By: Ivar Drape M.D.   On: 09/08/2016 14:27   Dg Knee 1-2 Views Right Result Date: 09/08/2016 CLINICAL DATA:  Chronic bilateral knee pain.  No known injury. EXAM: RIGHT KNEE - 1-2 VIEW COMPARISON:  Plain films of the right knee 03/21/2008. FINDINGS: Right total knee arthroplasty is in place. The device is located. No hardware complication is identified. No fracture. No joint effusion. IMPRESSION: No acute abnormality. Right total knee replacement without complicating feature. Electronically Signed   By: Inge Rise M.D.   On: 09/08/2016 14:27   Dg Hand Complete Left Result Date: 09/08/2016 CLINICAL DATA:  Chronic bilateral hand pain. No known injury. Initial encounter. EXAM: LEFT HAND - COMPLETE 3+ VIEW COMPARISON:  None. FINDINGS: No acute bony or joint abnormality is identified. Joint space narrowing and osteophytosis are seen about the first [facility] joint, IP joint of the thumb and DIP joints of the index and long fingers. Changes appear worst about the index and long fingers. Small calcification projecting over the dorsum of the wrist may be due to remote trauma. Soft tissues are otherwise unremarkable. IMPRESSION: No acute abnormality. Osteoarthritis first [place] joint, IP joint of the thumb and DIP joints of the index and long fingers. Electronically Signed   By: Inge Rise M.D.   On: 09/08/2016 14:29   Dg Hand Complete Right Result Date: 09/08/2016 CLINICAL DATA:  Chronic bilateral hand pain.  No known injury. EXAM: RIGHT HAND - COMPLETE 3+ VIEW COMPARISON:  None. FINDINGS: No acute bony or joint abnormality is identified. Joint space narrowing and osteophytosis are seen about the first [facility] joint, IP joint of the thumb and DIP joints of all the fingers. Changes appear worst at the DIP joints of the index and long fingers. Soft tissues are unremarkable. IMPRESSION: No acute abnormality. Osteoarthritis first [place] joint, IP joint of the thumb and DIP joints of all the  fingers. Changes are worst at the DIP joints of the index and long fingers. Electronically Signed   By: Inge Rise M.D.   On: 09/08/2016 14:31   Dg Foot Complete Left Result Date: 09/08/2016 CLINICAL DATA:  Left foot pain for 6 months. No known injury. Initial encounter. EXAM: LEFT FOOT - COMPLETE 3+ VIEW COMPARISON:  None. FINDINGS: No acute bony or joint abnormality is identified. Mild first MTP osteoarthritis is seen. Calcaneal spurring is noted. Soft tissues are unremarkable. IMPRESSION: No acute abnormality. Mild first MTP osteoarthritis. Calcaneal spurring. Electronically Signed   By: Inge Rise M.D.   On: 09/08/2016 14:32   Dg Hip Unilat W Or W/o Pelvis 2-3 Views Left Result Date: 09/08/2016 CLINICAL DATA:  Chronic low back pain.  Hip pain.  No known injury. EXAM: DG HIP (WITH OR WITHOUT PELVIS) 2-3V LEFT COMPARISON:  None. FINDINGS: No acute bony or joint abnormality is seen. Mild degenerative change is present about the hips and appears symmetric from right to left. No focal bony lesion. Soft tissues unremarkable. IMPRESSION: Mild bilateral hip degenerative disease. Electronically Signed   By: Inge Rise M.D.   On: 09/08/2016 14:27   Dg Hip Unilat W Or W/o Pelvis 2-3 Views Right Result Date: 09/08/2016 CLINICAL DATA:  Chronic low back pain.  Hip pain.  No known injury. EXAM: DG HIP (WITH OR WITHOUT PELVIS) 2-3V RIGHT COMPARISON:  None. FINDINGS: No acute bony or joint abnormality is identified. Mild degenerative change is seen about both hips appears symmetric from right to left. No focal bony lesion. Atherosclerotic calcifications noted. IMPRESSION: No acute abnormality. Mild bilateral hip degenerative disease. Electronically Signed   By: Inge Rise M.D.   On: 09/08/2016 14:28   Cervical Imaging: Cervical DG complete:  Results for orders placed during the hospital encounter of 09/08/16  DG Cervical Spine Complete   Narrative CLINICAL DATA:  Neck pain for 2 years.   Fibromyalgia.  Osteopenia.  EXAM: CERVICAL SPINE - COMPLETE 4+ VIEW  COMPARISON:  None.  FINDINGS: The cervical spine is visualized the skullbase through the cervicothoracic junction. Grade 1 degenerative anterolisthesis is evident at C4-5 C5-6, and C6-7. Vertebral body heights are maintained. Uncovertebral spurring an osseous foraminal narrowing is worse on the left at C4-5 and C5-6. Atherosclerotic calcifications are also worse on the left. The lung apices are clear.  IMPRESSION: 1. Multilevel spondylosis of the cervical spine. 2. Osseous foraminal narrowing due to uncovertebral disease is greatest on the left at C4-5 and C5-6. 3. Carotid atherosclerosis.   Electronically Signed   By: San Morelle M.D.   On: 09/08/2016 14:25    Lumbosacral Imaging: Lumbar DG Bending views:  Results for orders placed during the hospital encounter of 09/08/16  DG Lumbar Spine Complete W/Bend   Narrative CLINICAL DATA:  Chronic lower back pain without known injury.  EXAM: LUMBAR SPINE - COMPLETE WITH BENDING VIEWS  COMPARISON:  None.  FINDINGS: No fracture or spondylolisthesis is noted. No change in vertebral body alignment is noted on the flexion or extension views. Mild degenerative disc disease is noted at L1-2 and L4-5. Moderate degenerative disc disease is noted at L5-S1. Atherosclerosis of abdominal aorta is noted.  IMPRESSION: Multilevel degenerative disc disease. No acute abnormality seen in the lumbar spine.   Electronically Signed   By: Marijo Conception, M.D.   On: 09/08/2016 14:29    Sacroiliac Joint Imaging: Sacroiliac Joint DG:  Results for orders placed during the hospital encounter of 09/08/16  DG Si Joints   Narrative CLINICAL DATA:  Lower back pain for 10 years without known injury.  EXAM: BILATERAL SACROILIAC JOINTS - 3+ VIEW  COMPARISON:  None.  FINDINGS: The sacroiliac joint spaces are maintained and there is no evidence of arthropathy. No  other bone abnormalities are seen.  IMPRESSION: Normal sacroiliac joints.   Electronically Signed  By: Marijo Conception, M.D.   On: 09/08/2016 14:26    Hip Imaging: Hip-R DG 2-3 views:  Results for orders placed during the hospital encounter of 09/08/16  DG HIP UNILAT W OR W/O PELVIS 2-3 VIEWS RIGHT   Narrative CLINICAL DATA:  Chronic low back pain.  Hip pain.  No known injury.  EXAM: DG HIP (WITH OR WITHOUT PELVIS) 2-3V RIGHT  COMPARISON:  None.  FINDINGS: No acute bony or joint abnormality is identified. Mild degenerative change is seen about both hips appears symmetric from right to left. No focal bony lesion. Atherosclerotic calcifications noted.  IMPRESSION: No acute abnormality.  Mild bilateral hip degenerative disease.   Electronically Signed   By: Inge Rise M.D.   On: 09/08/2016 14:28    Hip-L DG 2-3 views:  Results for orders placed during the hospital encounter of 09/08/16  DG HIP UNILAT W OR W/O PELVIS 2-3 VIEWS LEFT   Narrative CLINICAL DATA:  Chronic low back pain.  Hip pain.  No known injury.  EXAM: DG HIP (WITH OR WITHOUT PELVIS) 2-3V LEFT  COMPARISON:  None.  FINDINGS: No acute bony or joint abnormality is seen. Mild degenerative change is present about the hips and appears symmetric from right to left. No focal bony lesion. Soft tissues unremarkable.  IMPRESSION: Mild bilateral hip degenerative disease.   Electronically Signed   By: Inge Rise M.D.   On: 09/08/2016 14:27    Knee Imaging: Knee-R DG 1-2 views:  Results for orders placed during the hospital encounter of 09/08/16  DG Knee 1-2 Views Right   Narrative CLINICAL DATA:  Chronic bilateral knee pain.  No known injury.  EXAM: RIGHT KNEE - 1-2 VIEW  COMPARISON:  Plain films of the right knee 03/21/2008.  FINDINGS: Right total knee arthroplasty is in place. The device is located. No hardware complication is identified. No fracture. No joint  effusion.  IMPRESSION: No acute abnormality. Right total knee replacement without complicating feature.   Electronically Signed   By: Inge Rise M.D.   On: 09/08/2016 14:27    Knee-L DG 1-2 views:  Results for orders placed during the hospital encounter of 09/08/16  DG Knee 1-2 Views Left   Narrative CLINICAL DATA:  Joint pain, no trauma  EXAM: LEFT KNEE - 1-2 VIEW  COMPARISON:  None  FINDINGS: There is primarily bicompartmental degenerative joint disease of the left knee mainly involving the medial compartment where there is more loss of joint space, sclerosis, and spurring. Degenerative change also involves the patellofemoral articulation. The lateral compartment is relatively well preserved. No fracture is seen. No joint effusion is noted. Soft tissue calcifications are noted in the soft tissues just inferior to the tip of the patella.  IMPRESSION: Bicompartmental degenerative joint disease of the left knee primarily involving the medial compartment. No fracture or joint effusion.   Electronically Signed   By: Ivar Drape M.D.   On: 09/08/2016 14:27    Note: Results of ordered imaging test(s) reviewed and explained to patient in Layman's terms. Copy of results provided to patient  Meds  The patient has a current medication list which includes the following prescription(s): alprazolam, amoxicillin-clavulanate, carvedilol, duloxetine, esomeprazole, loratadine, losartan, and oxycodone.  Current Outpatient Prescriptions on File Prior to Visit  Medication Sig  . ALPRAZolam (XANAX) 0.25 MG tablet Take 1 tablet (0.25 mg total) by mouth 2 (two) times daily as needed for anxiety.  [name] amoxicillin-clavulanate (AUGMENTIN) 875-125 MG tablet Take 1 tablet by mouth 2 (  two) times daily. (Patient taking differently: Take 1 tablet by mouth 2 (two) times daily. )  . carvedilol (COREG) 12.5 MG tablet Take 1 tablet (12.5 mg total) by mouth 2 (two) times daily with a meal.  .  DULoxetine (CYMBALTA) 60 MG capsule Take 1 capsule (60 mg total) by mouth daily.  [name] esomeprazole (NEXIUM) 40 MG capsule Take 40 mg by mouth daily at 12 noon.  . loratadine (CLARITIN) 10 MG tablet Take 1 tablet (10 mg total) by mouth daily. (Patient taking differently: Take 10 mg by mouth as needed. )  . losartan (COZAAR) 100 MG tablet Take 1 tablet (100 mg total) by mouth daily.   No current facility-administered medications on file prior to visit.    ROS  Constitutional: Denies any fever or chills Gastrointestinal: No reported hemesis, hematochezia, vomiting, or acute GI distress Musculoskeletal: Denies any acute onset joint swelling, redness, loss of ROM, or weakness Neurological: No reported episodes of acute onset apraxia, aphasia, dysarthria, agnosia, amnesia, paralysis, loss of coordination, or loss of consciousness  Allergies  Ms. Shadowens is allergic to nsaids and erythromycin.  [place]  Drug: Ms. Mascari  reports that she does not use drugs. Alcohol:  reports that she does not drink alcohol. Tobacco:  reports that she has never smoked. She has never used smokeless tobacco. Medical:  has a past medical history of Acute anxiety (03/06/2015); Allergy; Anxiety; Degenerative joint disease of knee; Depression; Fibromyalgia; Obesity; Osteopenia; and RAD (reactive airway disease). Family: family history includes Alzheimer's disease in her father; Cancer in her sister; Diabetes in her mother; Heart disease in her mother; Parkinson's disease in her father; Stroke in her mother.  Past Surgical History:  Procedure Laterality Date  . ABDOMINAL HYSTERECTOMY    . APPENDECTOMY    . CHOLECYSTECTOMY    . EYE SURGERY    . JOINT REPLACEMENT  2014   right knee  . PAROTIDECTOMY Left   . TONSILLECTOMY     Constitutional Exam  General appearance: Well nourished, well developed, and well hydrated. In no apparent acute distress Vitals:   09/24/16 0934 09/24/16 0935  BP:  (!) 171/67  Pulse:  76   Resp:  18  Temp:  98.5 F (36.9 C)  SpO2:  100%  Weight: 222 lb (100.7 kg)   Height: 5' (1.524 m)    BMI Assessment: Estimated body mass index is 43.36 kg/m as calculated from the following:   Height as of this encounter: 5' (1.524 m).   Weight as of this encounter: 222 lb (100.7 kg).  BMI interpretation table: BMI level Category Range association with higher incidence of chronic pain  <18 kg/m2 Underweight   18.5-24.9 kg/m2 Ideal body weight   25-29.9 kg/m2 Overweight Increased incidence by 20%  30-34.9 kg/m2 Obese (Class I) Increased incidence by 68%  35-39.9 kg/m2 Severe obesity (Class II) Increased incidence by 136%  >40 kg/m2 Extreme obesity (Class III) Increased incidence by 254%   BMI Readings from Last 4 Encounters:  09/24/16 43.36 kg/m  09/08/16 43.16 kg/m  07/09/16 44.18 kg/m  06/10/16 43.16 kg/m   Wt Readings from Last 4 Encounters:  09/24/16 222 lb (100.7 kg)  09/08/16 221 lb (100.2 kg)  07/09/16 223 lb 3.2 oz (101.2 kg)  06/10/16 221 lb (100.2 kg)  Psych/Mental status: Alert, oriented x 3 (person, place, & time)       Eyes: PERLA Respiratory: No evidence of acute respiratory distress  Cervical Spine Exam  Inspection: No masses, redness, or swelling Alignment: Symmetrical  Functional ROM: Unrestricted ROM Stability: No instability detected Muscle strength & Tone: Functionally intact Sensory: Unimpaired Palpation: Non-contributory  Upper Extremity (UE) Exam    Side: Right upper extremity  Side: Left upper extremity  Inspection: No masses, redness, swelling, or asymmetry  Inspection: No masses, redness, swelling, or asymmetry  Functional ROM: Unrestricted ROM          Functional ROM: Unrestricted ROM          Muscle strength & Tone: Functionally intact  Muscle strength & Tone: Functionally intact  Sensory: Unimpaired  Sensory: Unimpaired  Palpation: Non-contributory  Palpation: Non-contributory   Thoracic Spine Exam  Inspection: No masses, redness,  or swelling Alignment: Symmetrical Functional ROM: Unrestricted ROM Stability: No instability detected Sensory: Unimpaired Muscle strength & Tone: Functionally intact Palpation: Non-contributory  Lumbar Spine Exam  Inspection: No masses, redness, or swelling Alignment: Symmetrical Functional ROM: Unrestricted ROM Stability: No instability detected Muscle strength & Tone: Functionally intact Sensory: Unimpaired Palpation: Non-contributory Provocative Tests: Lumbar Hyperextension and rotation test: evaluation deferred today       Patrick's Maneuver: evaluation deferred today              Gait & Posture Assessment  Ambulation: Unassisted Gait: Relatively normal for age and body habitus Posture: WNL   Lower Extremity Exam    Side: Right lower extremity  Side: Left lower extremity  Inspection: No masses, redness, swelling, or asymmetry  Inspection: No masses, redness, swelling, or asymmetry  Functional ROM: Unrestricted ROM          Functional ROM: Unrestricted ROM          Muscle strength & Tone: Functionally intact  Muscle strength & Tone: Functionally intact  Sensory: Unimpaired  Sensory: Unimpaired  Palpation: Non-contributory  Palpation: Non-contributory   Assessment & Plan  Primary Diagnosis & Pertinent Problem List: The primary encounter diagnosis was Chronic pain syndrome. Diagnoses of Long term current use of opiate analgesic, Opiate use, Cervicogenic headache, Chronic knee pain (Bilateral), Chronic low back pain, Fibromyalgia, Sacroiliac joint disease, Osteoarthritis of knee (Location of Primary Source of Pain) (Left), and Facet syndrome, lumbar were also pertinent to this visit.  Visit Diagnosis: 1. Chronic pain syndrome   2. Long term current use of opiate analgesic   3. Opiate use   4. Cervicogenic headache   5. Chronic knee pain (Bilateral)   6. Chronic low back pain   7. Fibromyalgia   8. Sacroiliac joint disease   9. Osteoarthritis of knee (Location of Primary  Source of Pain) (Left)   10. Facet syndrome, lumbar    Problems updated and reviewed during this visit: Problem  Chronic low back pain (Location of Secondary source of pain) (Bilateral) (L>R)  Chronic knee pain (Location of Primary Source of Pain) (Bilateral) (L>R)  Osteoarthritis of knee (Location of Primary Source of Pain) (Left)  Facet Syndrome, Lumbar   Problem-specific Plan(s): No problem-specific Assessment & Plan notes found for this encounter.  Assessment & plan notes cannot be loaded without a specified hospital service.  Plan of Care  Pharmacotherapy (Medications Ordered): Meds ordered this encounter  Medications  . oxyCODONE (OXY IR/ROXICODONE) 5 MG immediate release tablet    Sig: Take 1 tablet (5 mg total) by mouth 2 (two) times daily.    Dispense:  60 tablet    Refill:  0    Fill one day early if pharmacy is closed on scheduled refill date. Do not fill until: 09/24/16 To last until: 10/24/16   Lab-work, procedure(s), and/or referral(s):  Orders Placed This Encounter  Procedures  . LUMBAR FACET(MEDIAL BRANCH NERVE BLOCK) MBNB  . C-reactive protein  . Sedimentation rate  . Magnesium  . Vitamin B12  . 25-Hydroxyvitamin D Lcms D2+D3    Pharmacotherapy: Opioid Analgesics: We'll take over management today. See above orders Membrane stabilizer: We have discussed the possibility of optimizing this mode of therapy, if tolerated Muscle relaxant: We have discussed the possibility of a trial NSAID: We have discussed the possibility of a trial Other analgesic(s): To be determined at a later time   Interventional therapies: Planned, scheduled, and/or pending:    Left Lumbar Facet Block.   Considering:   Diagnostic right Genicular nerve block under fluoroscopic guidance and IV sedation  Possible right Genicular nerve radiofrequency ablation  Diagnostic left intra-articular knee joint injection  Possible series of 5 left intra-articular Hyalgan knee injections   Diagnostic left Genicular nerve block  Possible left Genicular nerve RFA  Diagnostic bilateral lumbar facet block under fluoroscopic guidance and IV sedation  Possible bilateral lumbar facet RFA  Diagnostic left lumbar epidural steroid injection under fluoroscopic guidance  Diagnostic caudal epidural steroid injection + epidurogram  Possible Racz procedure  Diagnostic bilateral cervical facet block under fluoroscopic guidance and IV sedation  Possible bilateral cervical facet RFA  Diagnostic right versus left cervical epidural steroid injection under fluoroscopic guidance  Diagnostic bilateral intra-articular shoulder joint injection  Diagnostic bilateral suprascapular nerve block  Possible bilateral suprascapular nerve RFA    PRN Procedures:   To be determined at a later time   Provider-requested follow-up: Return in about 1 month (around 10/25/2016) for (MD) Med-Mgmt, in addition, procedure (ASAA).  Future Appointments Date Time Provider [place]  10/22/2016 11:30 AM Milinda Pointer, MD [facility] None    Primary Care Physician: Cynthia Maple, MD Location: [facility] Note by: Kathlen Brunswick. Dossie Arbour, M.D, DABA, DABAPM, DABPM, DABIPP, FIPP Date: 09/24/2016; Time: 11:40 PM  Pain Score Disclaimer: We use the NRS-11 scale. This is a self-reported, subjective measurement of pain severity with only modest accuracy. It is used primarily to identify changes within a particular patient. It must be understood that outpatient pain scales are significantly less accurate that those used for research, where they can be applied under ideal controlled circumstances with minimal exposure to variables. In reality, the score is likely to be a combination of pain intensity and pain affect, where pain affect describes the degree of emotional arousal or changes in action readiness caused by the sensory experience of pain. Factors such as social and work situation,  setting, emotional state, anxiety levels, expectation, and prior pain experience may influence pain perception and show large inter-individual differences that may also be affected by time variables.  Patient instructions provided during this appointment: Patient Instructions   GENERAL RISKS AND COMPLICATIONS  What are the risk, side effects and possible complications? Generally speaking, most procedures are safe.  However, with any procedure there are risks, side effects, and the possibility of complications.  The risks and complications are dependent upon the sites that are lesioned, or the type of nerve block to be performed.  The closer the procedure is to the spine, the more serious the risks are.  Great care is taken when placing the radio frequency needles, block needles or lesioning probes, but sometimes complications can occur. 1. Infection: Any time there is an injection through the skin, there is a risk of infection.  This is why sterile conditions are used for these blocks.  There  are four possible types of infection. 1. Localized skin infection. 2. Central Nervous System Infection-This can be in the form of Meningitis, which can be deadly. 3. Epidural Infections-This can be in the form of an epidural abscess, which can cause pressure inside of the spine, causing compression of the spinal cord with subsequent paralysis. This would require an emergency surgery to decompress, and there are no guarantees that the patient would recover from the paralysis. 4. Discitis-This is an infection of the intervertebral discs.  It occurs in about 1% of discography procedures.  It is difficult to treat and it may lead to surgery.        2. Pain: the needles have to go through skin and soft tissues, will cause soreness.       3. Damage to internal structures:  The nerves to be lesioned may be near blood vessels or    other nerves which can be potentially damaged.       4. Bleeding: Bleeding is more  common if the patient is taking blood thinners such as  aspirin, Coumadin, Ticiid, Plavix, etc., or if he/she have some genetic predisposition  such as hemophilia. Bleeding into the spinal canal can cause compression of the spinal  cord with subsequent paralysis.  This would require an emergency surgery to  decompress and there are no guarantees that the patient would recover from the  paralysis.       5. Pneumothorax:  Puncturing of a lung is a possibility, every time a needle is introduced in  the area of the chest or upper back.  Pneumothorax refers to free air around the  collapsed lung(s), inside of the thoracic cavity (chest cavity).  Another two possible  complications related to a similar event would include: Hemothorax and Chylothorax.   These are variations of the Pneumothorax, where instead of air around the collapsed  lung(s), you may have blood or chyle, respectively.       6. Spinal headaches: They may occur with any procedures in the area of the spine.       7. Persistent CSF (Cerebro-Spinal Fluid) leakage: This is a rare problem, but may occur  with prolonged intrathecal or epidural catheters either due to the formation of a fistulous  track or a dural tear.       8. Nerve damage: By working so close to the spinal cord, there is always a possibility of  nerve damage, which could be as serious as a permanent spinal cord injury with  paralysis.       9. Death:  Although rare, severe deadly allergic reactions known as "Anaphylactic  reaction" can occur to any of the medications used.      10. Worsening of the symptoms:  We can always make thing worse.  What are the chances of something like this happening? Chances of any of this occuring are extremely low.  By statistics, you have more of a chance of getting killed in a motor vehicle accident: while driving to the hospital than any of the above occurring .  Nevertheless, you should be aware that they are possibilities.  In general, it is  similar to taking a shower.  Everybody knows that you can slip, hit your head and get killed.  Does that mean that you should not shower again?  Nevertheless always keep in mind that statistics do not mean anything if you happen to be on the wrong side of them.  Even if a procedure has a 1 (  one) in a 1,000,000 (million) chance of going wrong, it you happen to be that one..Also, keep in mind that by statistics, you have more of a chance of having something go wrong when taking medications.  Who should not have this procedure? If you are on a blood thinning medication (e.g. Coumadin, Plavix, see list of "Blood Thinners"), or if you have an active infection going on, you should not have the procedure.  If you are taking any blood thinners, please inform your physician.  How should I prepare for this procedure?  Do not eat or drink anything at least six hours prior to the procedure.  Bring a driver with you .  It cannot be a taxi.  Come accompanied by an adult that can drive you back, and that is strong enough to help you if your legs get weak or numb from the local anesthetic.  Take all of your medicines the morning of the procedure with just enough water to swallow them.  If you have diabetes, make sure that you are scheduled to have your procedure done first thing in the morning, whenever possible.  If you have diabetes, take only half of your insulin dose and notify our nurse that you have done so as soon as you arrive at the clinic.  If you are diabetic, but only take blood sugar pills (oral hypoglycemic), then do not take them on the morning of your procedure.  You may take them after you have had the procedure.  Do not take aspirin or any aspirin-containing medications, at least eleven (11) days prior to the procedure.  They may prolong bleeding.  Wear loose fitting clothing that may be easy to take off and that you would not mind if it got stained with Betadine or blood.  Do not wear any  jewelry or perfume  Remove any nail coloring.  It will interfere with some of our monitoring equipment.  NOTE: Remember that this is not meant to be interpreted as a complete list of all possible complications.  Unforeseen problems may occur.  BLOOD THINNERS The following drugs contain aspirin or other products, which can cause increased bleeding during surgery and should not be taken for 2 weeks prior to and 1 week after surgery.  If you should need take something for relief of minor pain, you may take acetaminophen which is found in Tylenol,m Datril, Anacin-3 and Panadol. It is not blood thinner. The products listed below are.  Do not take any of the products listed below in addition to any listed on your instruction sheet.  A.P.C or A.P.C with Codeine Codeine Phosphate Capsules #3 Ibuprofen Ridaura  ABC compound Congesprin Imuran rimadil  Advil Cope Indocin Robaxisal  Alka-Seltzer Effervescent Pain Reliever and Antacid Coricidin or Coricidin-D  Indomethacin Rufen  Alka-Seltzer plus Cold Medicine Cosprin Ketoprofen S-A-C Tablets  Anacin Analgesic Tablets or Capsules Coumadin Korlgesic Salflex  Anacin Extra Strength Analgesic tablets or capsules CP-2 Tablets Lanoril Salicylate  Anaprox Cuprimine Capsules Levenox Salocol  Anexsia-D Dalteparin Magan Salsalate  Anodynos Darvon compound Magnesium Salicylate Sine-off  Ansaid Dasin Capsules Magsal Sodium Salicylate  Anturane Depen Capsules Marnal Soma  APF Arthritis pain formula Dewitt's Pills Measurin Stanback  Argesic Dia-Gesic Meclofenamic Sulfinpyrazone  Arthritis Bayer Timed Release Aspirin Diclofenac Meclomen Sulindac  Arthritis pain formula Anacin Dicumarol Medipren Supac  Analgesic (Safety coated) Arthralgen Diffunasal Mefanamic Suprofen  Arthritis Strength Bufferin Dihydrocodeine Mepro Compound Suprol  Arthropan liquid Dopirydamole Methcarbomol with Aspirin Synalgos  ASA tablets/Enseals Disalcid Micrainin Tagament  Ascriptin Doan's  Midol Talwin  Ascriptin A/D Dolene Mobidin Tanderil  Ascriptin Extra Strength Dolobid Moblgesic Ticlid  Ascriptin with Codeine Doloprin or Doloprin with Codeine Momentum Tolectin  Asperbuf Duoprin Mono-gesic Trendar  Aspergum Duradyne Motrin or Motrin IB Triminicin  Aspirin plain, buffered or enteric coated Durasal Myochrisine Trigesic  Aspirin Suppositories Easprin Nalfon Trillsate  Aspirin with Codeine Ecotrin Regular or Extra Strength Naprosyn Uracel  Atromid-S Efficin Naproxen Ursinus  Auranofin Capsules Elmiron Neocylate Vanquish  Axotal Emagrin Norgesic Verin  Azathioprine Empirin or Empirin with Codeine Normiflo Vitamin E  Azolid Emprazil Nuprin Voltaren  Bayer Aspirin plain, buffered or children's or timed BC Tablets or powders Encaprin Orgaran Warfarin Sodium  Buff-a-Comp Enoxaparin Orudis Zorpin  Buff-a-Comp with Codeine Equegesic Os-Cal-Gesic   Buffaprin Excedrin plain, buffered or Extra Strength Oxalid   Bufferin Arthritis Strength Feldene Oxphenbutazone   Bufferin plain or Extra Strength Feldene Capsules Oxycodone with Aspirin   Bufferin with Codeine Fenoprofen Fenoprofen Pabalate or Pabalate-SF   Buffets II Flogesic Panagesic   Buffinol plain or Extra Strength Florinal or Florinal with Codeine Panwarfarin   Buf-Tabs Flurbiprofen Penicillamine   Butalbital Compound Four-way cold tablets Penicillin   Butazolidin Fragmin Pepto-Bismol   Carbenicillin Geminisyn Percodan   Carna Arthritis Reliever Geopen Persantine   Carprofen Gold's salt Persistin   Chloramphenicol Goody's Phenylbutazone   Chloromycetin Haltrain Piroxlcam   Clmetidine heparin Plaquenil   Cllnoril Hyco-pap Ponstel   Clofibrate Hydroxy chloroquine Propoxyphen         Before stopping any of these medications, be sure to consult the physician who ordered them.  Some, such as Coumadin (Warfarin) are ordered to prevent or treat serious conditions such as "deep thrombosis", "pumonary embolisms", and other heart  problems.  The amount of time that you may need off of the medication may also vary with the medication and the reason for which you were taking it.  If you are taking any of these medications, please make sure you notify your pain physician before you undergo any procedures.         Facet Joint Block The facet joints connect the bones of the spine (vertebrae). They make it possible for you to bend, twist, and make other movements with your spine. They also prevent you from overbending, overtwisting, and making other excessive movements.  A facet joint block is a procedure where a numbing medicine (anesthetic) is injected into a facet joint. Often, a type of anti-inflammatory medicine called a steroid is also injected. A facet joint block may be done for two reasons:   Diagnosis. A facet joint block may be done as a test to see whether neck or back pain is caused by a worn-down or infected facet joint. If the pain gets better after a facet joint block, it means the pain is probably coming from the facet joint. If the pain does not get better, it means the pain is probably not coming from the facet joint.   Therapy. A facet joint block may be done to relieve neck or back pain caused by a facet joint. A facet joint block is only done as a therapy if the pain does not improve with medicine, exercise programs, physical therapy, and other forms of pain management. LET [facility] CARE PROVIDER KNOW ABOUT:   Any allergies you have.   All medicines you are taking, including vitamins, herbs, eyedrops, and over-the-counter medicines and creams.   Previous problems you or members of your family have had with the use of anesthetics.  Any blood disorders you have had.   Other health problems you have. RISKS AND COMPLICATIONS Generally, having a facet joint block is safe. However, as with any procedure, complications can occur. Possible complications associated with having a facet joint block  include:   Bleeding.   Injury to a nerve near the injection site.   Pain at the injection site.   Weakness or numbness in areas controlled by nerves near the injection site.   Infection.   Temporary fluid retention.   Allergic reaction to anesthetics or medicines used during the procedure. BEFORE THE PROCEDURE   Follow your health care provider's instructions if you are taking dietary supplements or medicines. You may need to stop taking them or reduce your dosage.   Do not take any new dietary supplements or medicines without asking your health care provider first.   Follow your health care provider's instructions about eating and drinking before the procedure. You may need to stop eating and drinking several hours before the procedure.   Arrange to have an adult drive you home after the procedure. PROCEDURE  You may need to remove your clothing and dress in an open-back gown so that your health care provider can access your spine.   The procedure will be done while you are lying on an X-ray table. Most of the time you will be asked to lie on your stomach, but you may be asked to lie in a different position if an injection will be made in your neck.   Special machines will be used to monitor your oxygen levels, heart rate, and blood pressure.   If an injection will be made in your neck, an intravenous (IV) tube will be inserted into one of your veins. Fluids and medicine will flow directly into your body through the IV tube.   The area over the facet joint where the injection will be made will be cleaned with an antiseptic soap. The surrounding skin will be covered with sterile drapes.   An anesthetic will be applied to your skin to make the injection area numb. You may feel a temporary stinging or burning sensation.   A video X-ray machine will be used to locate the joint. A contrast dye may be injected into the facet joint area to help with locating the joint.    When the joint is located, an anesthetic medicine will be injected into the joint through the needle.   Your health care provider will ask you whether you feel pain relief. If you do feel relief, a steroid may be injected to provide pain relief for a longer period of time. If you do not feel relief or feel only partial relief, additional injections of an anesthetic may be made in other facet joints.   The needle will be removed, the skin will be cleansed, and bandages will be applied.  AFTER THE PROCEDURE   You will be observed for 15-30 minutes before being allowed to go home. Do not drive. Have an adult drive you or take a taxi or public transportation instead.   If you feel pain relief, the pain will return in several hours or days when the anesthetic wears off.   You may feel pain relief 2-14 days after the procedure. The amount of time this relief lasts varies from person to person.   It is normal to feel some tenderness over the injected area(s) for 2 days following the procedure.   If you have diabetes, you may have a  temporary increase in blood sugar. This information is not intended to replace advice given to you by your health care provider. Make sure you discuss any questions you have with your health care provider. Document Released: 01/06/2007 Document Revised: 09/07/2014 Document Reviewed: 05/13/2015 Elsevier Interactive Patient Education  2017 [place]  What are the risk, side effects and possible complications? Generally speaking, most procedures are safe.  However, with any procedure there are risks, side effects, and the possibility of complications.  The risks and complications are dependent upon the sites that are lesioned, or the type of nerve block to be performed.  The closer the procedure is to the spine, the more serious the risks are.  Great care is taken when placing the radio frequency needles, block needles or  lesioning probes, but sometimes complications can occur. 2. Infection: Any time there is an injection through the skin, there is a risk of infection.  This is why sterile conditions are used for these blocks.  There are four possible types of infection. 1. Localized skin infection. 2. Central Nervous System Infection-This can be in the form of Meningitis, which can be deadly. 3. Epidural Infections-This can be in the form of an epidural abscess, which can cause pressure inside of the spine, causing compression of the spinal cord with subsequent paralysis. This would require an emergency surgery to decompress, and there are no guarantees that the patient would recover from the paralysis. 4. Discitis-This is an infection of the intervertebral discs.  It occurs in about 1% of discography procedures.  It is difficult to treat and it may lead to surgery.        2. Pain: the needles have to go through skin and soft tissues, will cause soreness.       3. Damage to internal structures:  The nerves to be lesioned may be near blood vessels or    other nerves which can be potentially damaged.       4. Bleeding: Bleeding is more common if the patient is taking blood thinners such as  aspirin, Coumadin, Ticiid, Plavix, etc., or if he/she have some genetic predisposition  such as hemophilia. Bleeding into the spinal canal can cause compression of the spinal  cord with subsequent paralysis.  This would require an emergency surgery to  decompress and there are no guarantees that the patient would recover from the  paralysis.       5. Pneumothorax:  Puncturing of a lung is a possibility, every time a needle is introduced in  the area of the chest or upper back.  Pneumothorax refers to free air around the  collapsed lung(s), inside of the thoracic cavity (chest cavity).  Another two possible  complications related to a similar event would include: Hemothorax and Chylothorax.   These are variations of the Pneumothorax,  where instead of air around the collapsed  lung(s), you may have blood or chyle, respectively.       6. Spinal headaches: They may occur with any procedures in the area of the spine.       7. Persistent CSF (Cerebro-Spinal Fluid) leakage: This is a rare problem, but may occur  with prolonged intrathecal or epidural catheters either due to the formation of a fistulous  track or a dural tear.       8. Nerve damage: By working so close to the spinal cord, there is always a possibility of  nerve damage, which could be as serious as a  permanent spinal cord injury with  paralysis.       9. Death:  Although rare, severe deadly allergic reactions known as "Anaphylactic  reaction" can occur to any of the medications used.      10. Worsening of the symptoms:  We can always make thing worse.  What are the chances of something like this happening? Chances of any of this occuring are extremely low.  By statistics, you have more of a chance of getting killed in a motor vehicle accident: while driving to the hospital than any of the above occurring .  Nevertheless, you should be aware that they are possibilities.  In general, it is similar to taking a shower.  Everybody knows that you can slip, hit your head and get killed.  Does that mean that you should not shower again?  Nevertheless always keep in mind that statistics do not mean anything if you happen to be on the wrong side of them.  Even if a procedure has a 1 (one) in a 1,000,000 (million) chance of going wrong, it you happen to be that one..Also, keep in mind that by statistics, you have more of a chance of having something go wrong when taking medications.  Who should not have this procedure? If you are on a blood thinning medication (e.g. Coumadin, Plavix, see list of "Blood Thinners"), or if you have an active infection going on, you should not have the procedure.  If you are taking any blood thinners, please inform your physician.  How should I prepare  for this procedure?  Do not eat or drink anything at least six hours prior to the procedure.  Bring a driver with you .  It cannot be a taxi.  Come accompanied by an adult that can drive you back, and that is strong enough to help you if your legs get weak or numb from the local anesthetic.  Take all of your medicines the morning of the procedure with just enough water to swallow them.  If you have diabetes, make sure that you are scheduled to have your procedure done first thing in the morning, whenever possible.  If you have diabetes, take only half of your insulin dose and notify our nurse that you have done so as soon as you arrive at the clinic.  If you are diabetic, but only take blood sugar pills (oral hypoglycemic), then do not take them on the morning of your procedure.  You may take them after you have had the procedure.  Do not take aspirin or any aspirin-containing medications, at least eleven (11) days prior to the procedure.  They may prolong bleeding.  Wear loose fitting clothing that may be easy to take off and that you would not mind if it got stained with Betadine or blood.  Do not wear any jewelry or perfume  Remove any nail coloring.  It will interfere with some of our monitoring equipment.  NOTE: Remember that this is not meant to be interpreted as a complete list of all possible complications.  Unforeseen problems may occur.  BLOOD THINNERS The following drugs contain aspirin or other products, which can cause increased bleeding during surgery and should not be taken for 2 weeks prior to and 1 week after surgery.  If you should need take something for relief of minor pain, you may take acetaminophen which is found in Tylenol,m Datril, Anacin-3 and Panadol. It is not blood thinner. The products listed below are.  Do not take  any of the products listed below in addition to any listed on your instruction sheet.  A.P.C or A.P.C with Codeine Codeine Phosphate Capsules #3  Ibuprofen Ridaura  ABC compound Congesprin Imuran rimadil  Advil Cope Indocin Robaxisal  Alka-Seltzer Effervescent Pain Reliever and Antacid Coricidin or Coricidin-D  Indomethacin Rufen  Alka-Seltzer plus Cold Medicine Cosprin Ketoprofen S-A-C Tablets  Anacin Analgesic Tablets or Capsules Coumadin Korlgesic Salflex  Anacin Extra Strength Analgesic tablets or capsules CP-2 Tablets Lanoril Salicylate  Anaprox Cuprimine Capsules Levenox Salocol  Anexsia-D Dalteparin Magan Salsalate  Anodynos Darvon compound Magnesium Salicylate Sine-off  Ansaid Dasin Capsules Magsal Sodium Salicylate  Anturane Depen Capsules Marnal Soma  APF Arthritis pain formula Dewitt's Pills Measurin Stanback  Argesic Dia-Gesic Meclofenamic Sulfinpyrazone  Arthritis Bayer Timed Release Aspirin Diclofenac Meclomen Sulindac  Arthritis pain formula Anacin Dicumarol Medipren Supac  Analgesic (Safety coated) Arthralgen Diffunasal Mefanamic Suprofen  Arthritis Strength Bufferin Dihydrocodeine Mepro Compound Suprol  Arthropan liquid Dopirydamole Methcarbomol with Aspirin Synalgos  ASA tablets/Enseals Disalcid Micrainin Tagament  Ascriptin Doan's Midol Talwin  Ascriptin A/D Dolene Mobidin Tanderil  Ascriptin Extra Strength Dolobid Moblgesic Ticlid  Ascriptin with Codeine Doloprin or Doloprin with Codeine Momentum Tolectin  Asperbuf Duoprin Mono-gesic Trendar  Aspergum Duradyne Motrin or Motrin IB Triminicin  Aspirin plain, buffered or enteric coated Durasal Myochrisine Trigesic  Aspirin Suppositories Easprin Nalfon Trillsate  Aspirin with Codeine Ecotrin Regular or Extra Strength Naprosyn Uracel  Atromid-S Efficin Naproxen Ursinus  Auranofin Capsules Elmiron Neocylate Vanquish  Axotal Emagrin Norgesic Verin  Azathioprine Empirin or Empirin with Codeine Normiflo Vitamin E  Azolid Emprazil Nuprin Voltaren  Bayer Aspirin plain, buffered or children's or timed BC Tablets or powders Encaprin Orgaran Warfarin Sodium   Buff-a-Comp Enoxaparin Orudis Zorpin  Buff-a-Comp with Codeine Equegesic Os-Cal-Gesic   Buffaprin Excedrin plain, buffered or Extra Strength Oxalid   Bufferin Arthritis Strength Feldene Oxphenbutazone   Bufferin plain or Extra Strength Feldene Capsules Oxycodone with Aspirin   Bufferin with Codeine Fenoprofen Fenoprofen Pabalate or Pabalate-SF   Buffets II Flogesic Panagesic   Buffinol plain or Extra Strength Florinal or Florinal with Codeine Panwarfarin   Buf-Tabs Flurbiprofen Penicillamine   Butalbital Compound Four-way cold tablets Penicillin   Butazolidin Fragmin Pepto-Bismol   Carbenicillin Geminisyn Percodan   Carna Arthritis Reliever Geopen Persantine   Carprofen Gold's salt Persistin   Chloramphenicol Goody's Phenylbutazone   Chloromycetin Haltrain Piroxlcam   Clmetidine heparin Plaquenil   Cllnoril Hyco-pap Ponstel   Clofibrate Hydroxy chloroquine Propoxyphen         Before stopping any of these medications, be sure to consult the physician who ordered them.  Some, such as Coumadin (Warfarin) are ordered to prevent or treat serious conditions such as "deep thrombosis", "pumonary embolisms", and other heart problems.  The amount of time that you may need off of the medication may also vary with the medication and the reason for which you were taking it.  If you are taking any of these medications, please make sure you notify your pain physician before you undergo any procedures.

## 2016-09-24 NOTE — Patient Instructions (Addendum)
GENERAL RISKS AND COMPLICATIONS  What are the risk, side effects and possible complications? Generally speaking, most procedures are safe.  However, with any procedure there are risks, side effects, and the possibility of complications.  The risks and complications are dependent upon the sites that are lesioned, or the type of nerve block to be performed.  The closer the procedure is to the spine, the more serious the risks are.  Great care is taken when placing the radio frequency needles, block needles or lesioning probes, but sometimes complications can occur. 1. Infection: Any time there is an injection through the skin, there is a risk of infection.  This is why sterile conditions are used for these blocks.  There are four possible types of infection. 1. Localized skin infection. 2. Central Nervous System Infection-This can be in the form of Meningitis, which can be deadly. 3. Epidural Infections-This can be in the form of an epidural abscess, which can cause pressure inside of the spine, causing compression of the spinal cord with subsequent paralysis. This would require an emergency surgery to decompress, and there are no guarantees that the patient would recover from the paralysis. 4. Discitis-This is an infection of the intervertebral discs.  It occurs in about 1% of discography procedures.  It is difficult to treat and it may lead to surgery.        2. Pain: the needles have to go through skin and soft tissues, will cause soreness.       3. Damage to internal structures:  The nerves to be lesioned may be near blood vessels or    other nerves which can be potentially damaged.       4. Bleeding: Bleeding is more common if the patient is taking blood thinners such as  aspirin, Coumadin, Ticiid, Plavix, etc., or if he/she have some genetic predisposition  such as hemophilia. Bleeding into the spinal canal can cause compression of the spinal  cord with subsequent paralysis.  This would require an  emergency surgery to  decompress and there are no guarantees that the patient would recover from the  paralysis.       5. Pneumothorax:  Puncturing of a lung is a possibility, every time a needle is introduced in  the area of the chest or upper back.  Pneumothorax refers to free air around the  collapsed lung(s), inside of the thoracic cavity (chest cavity).  Another two possible  complications related to a similar event would include: Hemothorax and Chylothorax.   These are variations of the Pneumothorax, where instead of air around the collapsed  lung(s), you may have blood or chyle, respectively.       6. Spinal headaches: They may occur with any procedures in the area of the spine.       7. Persistent CSF (Cerebro-Spinal Fluid) leakage: This is a rare problem, but may occur  with prolonged intrathecal or epidural catheters either due to the formation of a fistulous  track or a dural tear.       8. Nerve damage: By working so close to the spinal cord, there is always a possibility of  nerve damage, which could be as serious as a permanent spinal cord injury with  paralysis.       9. Death:  Although rare, severe deadly allergic reactions known as "Anaphylactic  reaction" can occur to any of the medications used.      10. Worsening of the symptoms:  We can always make thing worse.    What are the chances of something like this happening? Chances of any of this occuring are extremely low.  By statistics, you have more of a chance of getting killed in a motor vehicle accident: while driving to the hospital than any of the above occurring .  Nevertheless, you should be aware that they are possibilities.  In general, it is similar to taking a shower.  Everybody knows that you can slip, hit your head and get killed.  Does that mean that you should not shower again?  Nevertheless always keep in mind that statistics do not mean anything if you happen to be on the wrong side of them.  Even if a procedure has a 1  (one) in a 1,000,000 (million) chance of going wrong, it you happen to be that one..Also, keep in mind that by statistics, you have more of a chance of having something go wrong when taking medications.  Who should not have this procedure? If you are on a blood thinning medication (e.g. Coumadin, Plavix, see list of "Blood Thinners"), or if you have an active infection going on, you should not have the procedure.  If you are taking any blood thinners, please inform your physician.  How should I prepare for this procedure?  Do not eat or drink anything at least six hours prior to the procedure.  Bring a driver with you .  It cannot be a taxi.  Come accompanied by an adult that can drive you back, and that is strong enough to help you if your legs get weak or numb from the local anesthetic.  Take all of your medicines the morning of the procedure with just enough water to swallow them.  If you have diabetes, make sure that you are scheduled to have your procedure done first thing in the morning, whenever possible.  If you have diabetes, take only half of your insulin dose and notify our nurse that you have done so as soon as you arrive at the clinic.  If you are diabetic, but only take blood sugar pills (oral hypoglycemic), then do not take them on the morning of your procedure.  You may take them after you have had the procedure.  Do not take aspirin or any aspirin-containing medications, at least eleven (11) days prior to the procedure.  They may prolong bleeding.  Wear loose fitting clothing that may be easy to take off and that you would not mind if it got stained with Betadine or blood.  Do not wear any jewelry or perfume  Remove any nail coloring.  It will interfere with some of our monitoring equipment.  NOTE: Remember that this is not meant to be interpreted as a complete list of all possible complications.  Unforeseen problems may occur.  BLOOD THINNERS The following drugs  contain aspirin or other products, which can cause increased bleeding during surgery and should not be taken for 2 weeks prior to and 1 week after surgery.  If you should need take something for relief of minor pain, you may take acetaminophen which is found in Tylenol,m Datril, Anacin-3 and Panadol. It is not blood thinner. The products listed below are.  Do not take any of the products listed below in addition to any listed on your instruction sheet.  A.P.C or A.P.C with Codeine Codeine Phosphate Capsules #3 Ibuprofen Ridaura  ABC compound Congesprin Imuran rimadil  Advil Cope Indocin Robaxisal  Alka-Seltzer Effervescent Pain Reliever and Antacid Coricidin or Coricidin-D  Indomethacin Rufen    Alka-Seltzer plus Cold Medicine Cosprin Ketoprofen S-A-C Tablets  Anacin Analgesic Tablets or Capsules Coumadin Korlgesic Salflex  Anacin Extra Strength Analgesic tablets or capsules CP-2 Tablets Lanoril Salicylate  Anaprox Cuprimine Capsules Levenox Salocol  Anexsia-D Dalteparin Magan Salsalate  Anodynos Darvon compound Magnesium Salicylate Sine-off  Ansaid Dasin Capsules Magsal Sodium Salicylate  Anturane Depen Capsules Marnal Soma  APF Arthritis pain formula Dewitt's Pills Measurin Stanback  Argesic Dia-Gesic Meclofenamic Sulfinpyrazone  Arthritis Bayer Timed Release Aspirin Diclofenac Meclomen Sulindac  Arthritis pain formula Anacin Dicumarol Medipren Supac  Analgesic (Safety coated) Arthralgen Diffunasal Mefanamic Suprofen  Arthritis Strength Bufferin Dihydrocodeine Mepro Compound Suprol  Arthropan liquid Dopirydamole Methcarbomol with Aspirin Synalgos  ASA tablets/Enseals Disalcid Micrainin Tagament  Ascriptin Doan's Midol Talwin  Ascriptin A/D Dolene Mobidin Tanderil  Ascriptin Extra Strength Dolobid Moblgesic Ticlid  Ascriptin with Codeine Doloprin or Doloprin with Codeine Momentum Tolectin  Asperbuf Duoprin Mono-gesic Trendar  Aspergum Duradyne Motrin or Motrin IB Triminicin  Aspirin  plain, buffered or enteric coated Durasal Myochrisine Trigesic  Aspirin Suppositories Easprin Nalfon Trillsate  Aspirin with Codeine Ecotrin Regular or Extra Strength Naprosyn Uracel  Atromid-S Efficin Naproxen Ursinus  Auranofin Capsules Elmiron Neocylate Vanquish  Axotal Emagrin Norgesic Verin  Azathioprine Empirin or Empirin with Codeine Normiflo Vitamin E  Azolid Emprazil Nuprin Voltaren  Bayer Aspirin plain, buffered or children's or timed BC Tablets or powders Encaprin Orgaran Warfarin Sodium  Buff-a-Comp Enoxaparin Orudis Zorpin  Buff-a-Comp with Codeine Equegesic Os-Cal-Gesic   Buffaprin Excedrin plain, buffered or Extra Strength Oxalid   Bufferin Arthritis Strength Feldene Oxphenbutazone   Bufferin plain or Extra Strength Feldene Capsules Oxycodone with Aspirin   Bufferin with Codeine Fenoprofen Fenoprofen Pabalate or Pabalate-SF   Buffets II Flogesic Panagesic   Buffinol plain or Extra Strength Florinal or Florinal with Codeine Panwarfarin   Buf-Tabs Flurbiprofen Penicillamine   Butalbital Compound Four-way cold tablets Penicillin   Butazolidin Fragmin Pepto-Bismol   Carbenicillin Geminisyn Percodan   Carna Arthritis Reliever Geopen Persantine   Carprofen Gold's salt Persistin   Chloramphenicol Goody's Phenylbutazone   Chloromycetin Haltrain Piroxlcam   Clmetidine heparin Plaquenil   Cllnoril Hyco-pap Ponstel   Clofibrate Hydroxy chloroquine Propoxyphen         Before stopping any of these medications, be sure to consult the physician who ordered them.  Some, such as Coumadin (Warfarin) are ordered to prevent or treat serious conditions such as "deep thrombosis", "pumonary embolisms", and other heart problems.  The amount of time that you may need off of the medication may also vary with the medication and the reason for which you were taking it.  If you are taking any of these medications, please make sure you notify your pain physician before you undergo any  procedures.         Facet Joint Block The facet joints connect the bones of the spine (vertebrae). They make it possible for you to bend, twist, and make other movements with your spine. They also prevent you from overbending, overtwisting, and making other excessive movements.  A facet joint block is a procedure where a numbing medicine (anesthetic) is injected into a facet joint. Often, a type of anti-inflammatory medicine called a steroid is also injected. A facet joint block may be done for two reasons:   Diagnosis. A facet joint block may be done as a test to see whether neck or back pain is caused by a worn-down or infected facet joint. If the pain gets better after a facet joint block, it means the   pain is probably coming from the facet joint. If the pain does not get better, it means the pain is probably not coming from the facet joint.   Therapy. A facet joint block may be done to relieve neck or back pain caused by a facet joint. A facet joint block is only done as a therapy if the pain does not improve with medicine, exercise programs, physical therapy, and other forms of pain management. LET YOUR HEALTH CARE PROVIDER KNOW ABOUT:   Any allergies you have.   All medicines you are taking, including vitamins, herbs, eyedrops, and over-the-counter medicines and creams.   Previous problems you or members of your family have had with the use of anesthetics.   Any blood disorders you have had.   Other health problems you have. RISKS AND COMPLICATIONS Generally, having a facet joint block is safe. However, as with any procedure, complications can occur. Possible complications associated with having a facet joint block include:   Bleeding.   Injury to a nerve near the injection site.   Pain at the injection site.   Weakness or numbness in areas controlled by nerves near the injection site.   Infection.   Temporary fluid retention.   Allergic reaction to  anesthetics or medicines used during the procedure. BEFORE THE PROCEDURE   Follow your health care provider's instructions if you are taking dietary supplements or medicines. You may need to stop taking them or reduce your dosage.   Do not take any new dietary supplements or medicines without asking your health care provider first.   Follow your health care provider's instructions about eating and drinking before the procedure. You may need to stop eating and drinking several hours before the procedure.   Arrange to have an adult drive you home after the procedure. PROCEDURE  You may need to remove your clothing and dress in an open-back gown so that your health care provider can access your spine.   The procedure will be done while you are lying on an X-ray table. Most of the time you will be asked to lie on your stomach, but you may be asked to lie in a different position if an injection will be made in your neck.   Special machines will be used to monitor your oxygen levels, heart rate, and blood pressure.   If an injection will be made in your neck, an intravenous (IV) tube will be inserted into one of your veins. Fluids and medicine will flow directly into your body through the IV tube.   The area over the facet joint where the injection will be made will be cleaned with an antiseptic soap. The surrounding skin will be covered with sterile drapes.   An anesthetic will be applied to your skin to make the injection area numb. You may feel a temporary stinging or burning sensation.   A video X-ray machine will be used to locate the joint. A contrast dye may be injected into the facet joint area to help with locating the joint.   When the joint is located, an anesthetic medicine will be injected into the joint through the needle.   Your health care provider will ask you whether you feel pain relief. If you do feel relief, a steroid may be injected to provide pain relief for a  longer period of time. If you do not feel relief or feel only partial relief, additional injections of an anesthetic may be made in other facet joints.     The needle will be removed, the skin will be cleansed, and bandages will be applied.  AFTER THE PROCEDURE   You will be observed for 15-30 minutes before being allowed to go home. Do not drive. Have an adult drive you or take a taxi or public transportation instead.   If you feel pain relief, the pain will return in several hours or days when the anesthetic wears off.   You may feel pain relief 2-14 days after the procedure. The amount of time this relief lasts varies from person to person.   It is normal to feel some tenderness over the injected area(s) for 2 days following the procedure.   If you have diabetes, you may have a temporary increase in blood sugar. This information is not intended to replace advice given to you by your health care provider. Make sure you discuss any questions you have with your health care provider. Document Released: 01/06/2007 Document Revised: 09/07/2014 Document Reviewed: 05/13/2015 [employer] Interactive Patient Education  2017 [employer] GENERAL RISKS AND COMPLICATIONS  What are the risk, side effects and possible complications? Generally speaking, most procedures are safe.  However, with any procedure there are risks, side effects, and the possibility of complications.  The risks and complications are dependent upon the sites that are lesioned, or the type of nerve block to be performed.  The closer the procedure is to the spine, the more serious the risks are.  Great care is taken when placing the radio frequency needles, block needles or lesioning probes, but sometimes complications can occur. 2. Infection: Any time there is an injection through the skin, there is a risk of infection.  This is why sterile conditions are used for these blocks.  There are four possible types of  infection. 1. Localized skin infection. 2. Central Nervous System Infection-This can be in the form of Meningitis, which can be deadly. 3. Epidural Infections-This can be in the form of an epidural abscess, which can cause pressure inside of the spine, causing compression of the spinal cord with subsequent paralysis. This would require an emergency surgery to decompress, and there are no guarantees that the patient would recover from the paralysis. 4. Discitis-This is an infection of the intervertebral discs.  It occurs in about 1% of discography procedures.  It is difficult to treat and it may lead to surgery.        2. Pain: the needles have to go through skin and soft tissues, will cause soreness.       3. Damage to internal structures:  The nerves to be lesioned may be near blood vessels or    other nerves which can be potentially damaged.       4. Bleeding: Bleeding is more common if the patient is taking blood thinners such as  aspirin, Coumadin, Ticiid, Plavix, etc., or if he/she have some genetic predisposition  such as hemophilia. Bleeding into the spinal canal can cause compression of the spinal  cord with subsequent paralysis.  This would require an emergency surgery to  decompress and there are no guarantees that the patient would recover from the  paralysis.       5. Pneumothorax:  Puncturing of a lung is a possibility, every time a needle is introduced in  the area of the chest or upper back.  Pneumothorax refers to free air around the  collapsed lung(s), inside of the thoracic cavity (chest cavity).  Another two possible  complications related to a similar event would  include: Hemothorax and Chylothorax.   These are variations of the Pneumothorax, where instead of air around the collapsed  lung(s), you may have blood or chyle, respectively.       6. Spinal headaches: They may occur with any procedures in the area of the spine.       7. Persistent CSF (Cerebro-Spinal Fluid) leakage: This is  a rare problem, but may occur  with prolonged intrathecal or epidural catheters either due to the formation of a fistulous  track or a dural tear.       8. Nerve damage: By working so close to the spinal cord, there is always a possibility of  nerve damage, which could be as serious as a permanent spinal cord injury with  paralysis.       9. Death:  Although rare, severe deadly allergic reactions known as "Anaphylactic  reaction" can occur to any of the medications used.      10. Worsening of the symptoms:  We can always make thing worse.  What are the chances of something like this happening? Chances of any of this occuring are extremely low.  By statistics, you have more of a chance of getting killed in a motor vehicle accident: while driving to the hospital than any of the above occurring .  Nevertheless, you should be aware that they are possibilities.  In general, it is similar to taking a shower.  Everybody knows that you can slip, hit your head and get killed.  Does that mean that you should not shower again?  Nevertheless always keep in mind that statistics do not mean anything if you happen to be on the wrong side of them.  Even if a procedure has a 1 (one) in a 1,000,000 (million) chance of going wrong, it you happen to be that one..Also, keep in mind that by statistics, you have more of a chance of having something go wrong when taking medications.  Who should not have this procedure? If you are on a blood thinning medication (e.g. Coumadin, Plavix, see list of "Blood Thinners"), or if you have an active infection going on, you should not have the procedure.  If you are taking any blood thinners, please inform your physician.  How should I prepare for this procedure?  Do not eat or drink anything at least six hours prior to the procedure.  Bring a driver with you .  It cannot be a taxi.  Come accompanied by an adult that can drive you back, and that is strong enough to help you if your  legs get weak or numb from the local anesthetic.  Take all of your medicines the morning of the procedure with just enough water to swallow them.  If you have diabetes, make sure that you are scheduled to have your procedure done first thing in the morning, whenever possible.  If you have diabetes, take only half of your insulin dose and notify our nurse that you have done so as soon as you arrive at the clinic.  If you are diabetic, but only take blood sugar pills (oral hypoglycemic), then do not take them on the morning of your procedure.  You may take them after you have had the procedure.  Do not take aspirin or any aspirin-containing medications, at least eleven (11) days prior to the procedure.  They may prolong bleeding.  Wear loose fitting clothing that may be easy to take off and that you would not mind if it got  stained with Betadine or blood.  Do not wear any jewelry or perfume  Remove any nail coloring.  It will interfere with some of our monitoring equipment.  NOTE: Remember that this is not meant to be interpreted as a complete list of all possible complications.  Unforeseen problems may occur.  BLOOD THINNERS The following drugs contain aspirin or other products, which can cause increased bleeding during surgery and should not be taken for 2 weeks prior to and 1 week after surgery.  If you should need take something for relief of minor pain, you may take acetaminophen which is found in Tylenol,m Datril, Anacin-3 and Panadol. It is not blood thinner. The products listed below are.  Do not take any of the products listed below in addition to any listed on your instruction sheet.  A.P.C or A.P.C with Codeine Codeine Phosphate Capsules #3 Ibuprofen Ridaura  ABC compound Congesprin Imuran rimadil  Advil Cope Indocin Robaxisal  Alka-Seltzer Effervescent Pain Reliever and Antacid Coricidin or Coricidin-D  Indomethacin Rufen  Alka-Seltzer plus Cold Medicine Cosprin Ketoprofen S-A-C  Tablets  Anacin Analgesic Tablets or Capsules Coumadin Korlgesic Salflex  Anacin Extra Strength Analgesic tablets or capsules CP-2 Tablets Lanoril Salicylate  Anaprox Cuprimine Capsules Levenox Salocol  Anexsia-D Dalteparin Magan Salsalate  Anodynos Darvon compound Magnesium Salicylate Sine-off  Ansaid Dasin Capsules Magsal Sodium Salicylate  Anturane Depen Capsules Marnal Soma  APF Arthritis pain formula Dewitt's Pills Measurin Stanback  Argesic Dia-Gesic Meclofenamic Sulfinpyrazone  Arthritis Bayer Timed Release Aspirin Diclofenac Meclomen Sulindac  Arthritis pain formula Anacin Dicumarol Medipren Supac  Analgesic (Safety coated) Arthralgen Diffunasal Mefanamic Suprofen  Arthritis Strength Bufferin Dihydrocodeine Mepro Compound Suprol  Arthropan liquid Dopirydamole Methcarbomol with Aspirin Synalgos  ASA tablets/Enseals Disalcid Micrainin Tagament  Ascriptin Doan's Midol Talwin  Ascriptin A/D Dolene Mobidin Tanderil  Ascriptin Extra Strength Dolobid Moblgesic Ticlid  Ascriptin with Codeine Doloprin or Doloprin with Codeine Momentum Tolectin  Asperbuf Duoprin Mono-gesic Trendar  Aspergum Duradyne Motrin or Motrin IB Triminicin  Aspirin plain, buffered or enteric coated Durasal Myochrisine Trigesic  Aspirin Suppositories Easprin Nalfon Trillsate  Aspirin with Codeine Ecotrin Regular or Extra Strength Naprosyn Uracel  Atromid-S Efficin Naproxen Ursinus  Auranofin Capsules Elmiron Neocylate Vanquish  Axotal Emagrin Norgesic Verin  Azathioprine Empirin or Empirin with Codeine Normiflo Vitamin E  Azolid Emprazil Nuprin Voltaren  Bayer Aspirin plain, buffered or children's or timed BC Tablets or powders Encaprin Orgaran Warfarin Sodium  Buff-a-Comp Enoxaparin Orudis Zorpin  Buff-a-Comp with Codeine Equegesic Os-Cal-Gesic   Buffaprin Excedrin plain, buffered or Extra Strength Oxalid   Bufferin Arthritis Strength Feldene Oxphenbutazone   Bufferin plain or Extra Strength Feldene Capsules  Oxycodone with Aspirin   Bufferin with Codeine Fenoprofen Fenoprofen Pabalate or Pabalate-SF   Buffets II Flogesic Panagesic   Buffinol plain or Extra Strength Florinal or Florinal with Codeine Panwarfarin   Buf-Tabs Flurbiprofen Penicillamine   Butalbital Compound Four-way cold tablets Penicillin   Butazolidin Fragmin Pepto-Bismol   Carbenicillin Geminisyn Percodan   Carna Arthritis Reliever Geopen Persantine   Carprofen Gold's salt Persistin   Chloramphenicol Goody's Phenylbutazone   Chloromycetin Haltrain Piroxlcam   Clmetidine heparin Plaquenil   Cllnoril Hyco-pap Ponstel   Clofibrate Hydroxy chloroquine Propoxyphen         Before stopping any of these medications, be sure to consult the physician who ordered them.  Some, such as Coumadin (Warfarin) are ordered to prevent or treat serious conditions such as "deep thrombosis", "pumonary embolisms", and other heart problems.  The amount of time that  you may need off of the medication may also vary with the medication and the reason for which you were taking it.  If you are taking any of these medications, please make sure you notify your pain physician before you undergo any procedures.

## 2016-09-27 LAB — 25-HYDROXY VITAMIN D LCMS D2+D3: 25-Hydroxy, Vitamin D-2: <1 ng/mL

## 2016-09-27 LAB — 25-HYDROXYVITAMIN D LCMS D2+D3
25-HYDROXY, VITAMIN D-3: 19 ng/mL
25-HYDROXY, VITAMIN D: 20 ng/mL — AB

## 2016-10-06 ENCOUNTER — Ambulatory Visit: Payer: Medicare Other | Admitting: Pain Medicine

## 2016-10-07 ENCOUNTER — Ambulatory Visit
Admission: RE | Admit: 2016-10-07 | Discharge: 2016-10-07 | Disposition: A | Discharge status: Home / Self Care | Payer: Medicare Other | Source: Ambulatory Visit | Attending: Pain Medicine | Admitting: Pain Medicine

## 2016-10-07 ENCOUNTER — Encounter: Payer: Self-pay | Admitting: Pain Medicine

## 2016-10-07 ENCOUNTER — Ambulatory Visit (HOSPITAL_BASED_OUTPATIENT_CLINIC_OR_DEPARTMENT_OTHER): Payer: Medicare Other | Admitting: Pain Medicine

## 2016-10-07 VITALS — BP 130/60 | HR 73 | Temp 98.2°F | Resp 17 | Ht 60.0 in | Wt 222.0 lb

## 2016-10-07 DIAGNOSIS — Z79891 Long term (current) use of opiate analgesic: Secondary | ICD-10-CM

## 2016-10-07 DIAGNOSIS — G8929 Other chronic pain: Secondary | ICD-10-CM

## 2016-10-07 DIAGNOSIS — M1288 Other specific arthropathies, not elsewhere classified, other specified site: Secondary | ICD-10-CM | POA: Diagnosis not present

## 2016-10-07 DIAGNOSIS — M47816 Spondylosis without myelopathy or radiculopathy, lumbar region: Secondary | ICD-10-CM | POA: Diagnosis not present

## 2016-10-07 DIAGNOSIS — M545 Low back pain: Secondary | ICD-10-CM

## 2016-10-07 DIAGNOSIS — F1190 Opioid use, unspecified, uncomplicated: Secondary | ICD-10-CM

## 2016-10-07 DIAGNOSIS — G894 Chronic pain syndrome: Secondary | ICD-10-CM

## 2016-10-07 MED ORDER — ROPIVACAINE HCL 2 MG/ML IJ SOLN
INTRAMUSCULAR | Status: AC
  Administered 2016-10-07: 09:00:00
  Filled 2016-10-07: qty 20

## 2016-10-07 MED ORDER — ROPIVACAINE HCL 2 MG/ML IJ SOLN: 9.0000 mL | Freq: Once | INTRAMUSCULAR | Status: DC

## 2016-10-07 MED ORDER — LIDOCAINE HCL (PF) 1 % IJ SOLN: 10.0000 mL | Freq: Once | INTRAMUSCULAR | Status: DC

## 2016-10-07 MED ORDER — MIDAZOLAM HCL 5 MG/5ML IJ SOLN
INTRAMUSCULAR | Status: AC
  Administered 2016-10-07: 1 mg via INTRAVENOUS
  Filled 2016-10-07: qty 5

## 2016-10-07 MED ORDER — TRIAMCINOLONE ACETONIDE 40 MG/ML IJ SUSP: 40.0000 mg | Freq: Once | INTRAMUSCULAR | Status: DC

## 2016-10-07 MED ORDER — FENTANYL CITRATE (PF) 100 MCG/2ML IJ SOLN
INTRAMUSCULAR | Status: AC
  Administered 2016-10-07: 50 ug
  Filled 2016-10-07: qty 2

## 2016-10-07 MED ORDER — MIDAZOLAM HCL 5 MG/5ML IJ SOLN: 1.0000 mg | INTRAMUSCULAR | Status: DC | PRN

## 2016-10-07 MED ORDER — OXYCODONE HCL 5 MG PO TABS: 5.0000 mg | ORAL_TABLET | Freq: Two times a day (BID) | ORAL | 0 refills | Status: DC

## 2016-10-07 MED ORDER — TRIAMCINOLONE ACETONIDE 40 MG/ML IJ SUSP
INTRAMUSCULAR | Status: AC
  Administered 2016-10-07: 09:00:00
  Filled 2016-10-07: qty 2

## 2016-10-07 MED ORDER — LACTATED RINGERS IV SOLN
1000.0000 mL | Freq: Once | INTRAVENOUS | Status: AC
  Administered 2016-10-07: 1000 mL via INTRAVENOUS

## 2016-10-07 MED ORDER — FENTANYL CITRATE (PF) 100 MCG/2ML IJ SOLN: 25.0000 ug | INTRAMUSCULAR | Status: DC | PRN

## 2016-10-07 NOTE — Progress Notes (Addendum)
Patient's Name: Cynthia Jones  MRN: [id number]  Referring Provider: Steele Sizer, MD  DOB: 1941/04/16  PCP: Steele Sizer, MD  DOS: 10/07/2016  Note by: Sydnee Levans. Laban Emperor, MD  Service setting: Ambulatory outpatient  Location: [facility] (AMB) Pain Management Facility  Visit type: Procedure  Specialty: Interventional Pain Management  Patient type: Established   Primary Reason for Visit: Interventional Pain Management Treatment. CC: Back Pain (low bilateral)  Procedure:  Anesthesia, Analgesia, Anxiolysis:  Type: Diagnostic Medial Branch Facet Block Region: Lumbar Level: L2, L3, L4, L5, & S1 Medial Branch Level(s) Laterality: Bilateral  Type: Local Anesthesia with Moderate (Conscious) Sedation Local Anesthetic: Lidocaine 1% Route: Intravenous (IV) IV Access: Secured Sedation: Meaningful verbal contact was maintained at all times during the procedure  Indication(s): Analgesia and Anxiety  Indications: 1. Facet syndrome, lumbar   2. Chronic low back pain (Location of Secondary source of pain) (Bilateral) (L>R)   3. Lumbar spondylosis   4. Chronic pain syndrome   5. Long term current use of opiate analgesic   6. Opiate use    Pain Score: Pre-procedure: 4 /10 Post-procedure: 1 /10  Pre-op Assessment:  Previous date of service: 09/24/16 Service provided: Evaluation Cynthia Jones is a 76 y.o. (year old), female patient, seen today for interventional treatment. She  has a past surgical history that includes Parotidectomy (Left); Appendectomy; Cholecystectomy; Abdominal hysterectomy; Tonsillectomy; Joint replacement (2014); and Eye surgery. Her primarily concern today is the Back Pain (low bilateral)  Initial Vital Signs: Blood pressure (!) 155/63, pulse 79, temperature 98 F (36.7 C), resp. rate 18, height 5' (1.524 m), weight 222 lb (100.7 kg), SpO2 99 %. BMI: 43.36 kg/m  Risk Assessment: Allergies: Reviewed. She is allergic to nsaids and erythromycin.  Allergy Precautions: None  required Coagulopathies: "Reviewed. None identified.  Blood-thinner therapy: None at this time Active Infection(s): Reviewed. None identified. [name] is afebrile  Site Confirmation: [name] was asked to confirm the procedure and laterality before marking the site Procedure checklist: Completed Consent: Before the procedure and under the influence of no sedative(s), amnesic(s), or anxiolytics, the patient was informed of the treatment options, risks and possible complications. To fulfill our ethical and legal obligations, as recommended by the American Medical Association's Code of Ethics, I have informed the patient of my clinical impression; the nature and purpose of the treatment or procedure; the risks, benefits, and possible complications of the intervention; the alternatives, including doing nothing; the risk(s) and benefit(s) of the alternative treatment(s) or procedure(s); and the risk(s) and benefit(s) of doing nothing. The patient was provided information about the general risks and possible complications associated with the procedure. These may include, but are not limited to: failure to achieve desired goals, infection, bleeding, organ or nerve damage, allergic reactions, paralysis, and death. In addition, the patient was informed of those risks and complications associated to Spine-related procedures, such as failure to decrease pain; infection (i.e.: Meningitis, epidural or intraspinal abscess); bleeding (i.e.: epidural hematoma, subarachnoid hemorrhage, or any other type of intraspinal or peri-dural bleeding); organ or nerve damage (i.e.: Any type of peripheral nerve, nerve root, or spinal cord injury) with subsequent damage to sensory, motor, and/or autonomic systems, resulting in permanent pain, numbness, and/or weakness of one or several areas of the body; allergic reactions; (i.e.: anaphylactic reaction); and/or death. Furthermore, the patient was informed of those risks and  complications associated with the medications. These include, but are not limited to: allergic reactions (i.e.: anaphylactic or anaphylactoid reaction(s)); adrenal axis suppression; blood  sugar elevation that in diabetics may result in ketoacidosis or comma; water retention that in patients with history of congestive heart failure may result in shortness of breath, pulmonary edema, and decompensation with resultant heart failure; weight gain; swelling or edema; medication-induced neural toxicity; particulate matter embolism and blood vessel occlusion with resultant organ, and/or nervous system infarction; and/or aseptic necrosis of one or more joints. Finally, the patient was informed that Medicine is not an exact science; therefore, there is also the possibility of unforeseen or unpredictable risks and/or possible complications that may result in a catastrophic outcome. The patient indicated having understood very clearly. We have given the patient no guarantees and we have made no promises. Enough time was given to the patient to ask questions, all of which were answered to the patient's satisfaction. [name] has indicated that she wanted to continue with the procedure. Attestation: I, the ordering provider, attest that I have discussed with the patient the benefits, risks, side-effects, alternatives, likelihood of achieving goals, and potential problems during recovery for the procedure that I have provided informed consent. Date: 10/07/2016; Time: 8:17 AM  Pre-Procedure Preparation:  Monitoring: As per clinic protocol. Respiration, ETCO2, SpO2, BP, heart rate and rhythm monitor placed and checked for adequate function Safety Precautions: Patient was assessed for positional comfort and pressure points before starting the procedure. Time-out: I initiated and conducted the "Time-out" before starting the procedure, as per protocol. The patient was asked to participate by confirming the accuracy of the  "Time Out" information. Verification of the correct person, site, and procedure were performed and confirmed by me, the nursing staff, and the patient. "Time-out" conducted as per Joint Commission's Universal Protocol (UP.01.01.01). "Time-out" Date & Time: 10/07/2016; 0835 hrs.  Description of Procedure Process:   Position: Prone Target Area: For Lumbar Facet blocks, the target is the groove formed by the junction of the transverse process and superior articular process. For the L5 dorsal ramus, the target is the notch between superior articular process and sacral ala. For the S1 dorsal ramus, the target is the superior and lateral edge of the posterior S1 Sacral foramen. Approach: Paramedial approach. Area Prepped: Entire Posterior Lumbosacral Region Prepping solution: ChloraPrep (2% chlorhexidine gluconate and 70% isopropyl alcohol) Safety Precautions: Aspiration looking for blood return was conducted prior to all injections. At no point did we inject any substances, as a needle was being advanced. No attempts were made at seeking any paresthesias. Safe injection practices and needle disposal techniques used. Medications properly checked for expiration dates. SDV (single dose vial) medications used. Description of the Procedure: Protocol guidelines were followed. The patient was placed in position over the fluoroscopy table. The target area was identified and the area prepped in the usual manner. Skin desensitized using vapocoolant spray. Skin & deeper tissues infiltrated with local anesthetic. Appropriate amount of time allowed to pass for local anesthetics to take effect. The procedure needle was introduced through the skin, ipsilateral to the reported pain, and advanced to the target area. Employing the "Medial Branch Technique", the needles were advanced to the angle made by the superior and medial portion of the transverse process, and the lateral and inferior portion of the superior articulating  process of the targeted vertebral bodies. This area is known as "Burton's Eye" or the "Eye of the [place] Dog". A procedure needle was introduced through the skin, and this time advanced to the angle made by the superior and medial border of the sacral ala, and the lateral border of the  S1 vertebral body. This last needle was later repositioned at the superior and lateral border of the posterior S1 foramen. Negative aspiration confirmed. Solution injected in intermittent fashion, asking for systemic symptoms every 0.5cc of injectate. The needles were then removed and the area cleansed, making sure to leave some of the prepping solution back to take advantage of its long term bactericidal properties. Start Time: 0826 hrs. End Time: 0848 hrs.  Illustration of the posterior view of the lumbar spine and the posterior neural structures. Laminae of L2 through S1 are labeled. DPRL5, dorsal primary ramus of L5; DPRS1, dorsal primary ramus of S1; DPR3, dorsal primary ramus of L3; FJ, facet (zygapophyseal) joint L3-L4; I, inferior articular process of L4; LB1, lateral branch of dorsal primary ramus of L1; IAB, inferior articular branches from L3 medial branch (supplies L4-L5 facet joint); IBP, intermediate branch plexus; MB3, medial branch of dorsal primary ramus of L3; NR3, third lumbar nerve root; S, superior articular process of L5; SAB, superior articular branches from L4 (supplies L4-5 facet joint also); TP3, transverse process of L3.  Materials:  Needle(s) Type: Regular needle Gauge: 22G Length: 3.5-in Medication(s): We administered ropivacaine (PF) 2 mg/mL (0.2%), fentaNYL, triamcinolone acetonide, midazolam, and lactated ringers. Please see chart orders for dosing details.  Imaging Guidance (Spinal):  Type of Imaging Technique: Fluoroscopy Guidance (Spinal) Indication(s): Assistance in needle guidance and placement for procedures requiring needle placement in or near specific anatomical locations not  easily accessible without such assistance. Exposure Time: Please see nurses notes. Contrast: None used. Fluoroscopic Guidance: I was personally present during the use of fluoroscopy. "Tunnel Vision Technique" used to obtain the best possible view of the target area. Parallax error corrected before commencing the procedure. "Direction-depth-direction" technique used to introduce the needle under continuous pulsed fluoroscopy. Once target was reached, antero-posterior, oblique, and lateral fluoroscopic projection used confirm needle placement in all planes. Images permanently stored in EMR. Interpretation: No contrast injected. I personally interpreted the imaging intraoperatively. Adequate needle placement confirmed in multiple planes. Permanent images saved into the patient's record.  Antibiotic Prophylaxis:  Indication(s): None identified Antibiotic given: None  Post-operative Assessment:  EBL: None Complications: No immediate post-treatment complications observed by team, or reported by patient. Note: The patient tolerated the entire procedure well. A repeat set of vitals were taken after the procedure and the patient was kept under observation following institutional policy, for this type of procedure. Post-procedural neurological assessment was performed, showing return to baseline, prior to discharge. The patient was provided with post-procedure discharge instructions, including a section on how to identify potential problems. Should any problems arise concerning this procedure, the patient was given instructions to immediately contact us, at any time, without hesitation. In any case, we plan to contact the patient by telephone for a follow-up status report regarding this interventional procedure. Comments:  No additional relevant information.  Plan of Care  Disposition: Discharge home  Discharge Date & Time: 10/07/2016; 0918 hrs.  Physician-requested Follow-up:  Return in about 2 weeks  (around 10/21/2016) for Post-Procedure evaluation.  Future Appointments Date Time Provider Department Center  10/22/2016 11:30 AM Delano Metz, MD [facility] None   Medications ordered for procedure: Meds ordered this encounter  Medications  . fentaNYL (SUBLIMAZE) injection 25-50 mcg    Make sure Narcan is available in the pyxis when using this medication. In the event of respiratory depression (RR< 8/min): Titrate NARCAN (naloxone) in increments of 0.1 to 0.2 mg IV at 2-3 minute intervals, until desired degree of reversal.  .  lactated ringers infusion 1,000 mL  . midazolam (VERSED) 5 MG/5ML injection 1-2 mg    Make sure Flumazenil is available in the pyxis when using this medication. If oversedation occurs, administer 0.2 mg IV over 15 sec. If after 45 sec no response, administer 0.2 mg again over 1 min; may repeat at 1 min intervals; not to exceed 4 doses (1 mg)  . triamcinolone acetonide (KENALOG-40) injection 40 mg  . lidocaine (PF) (XYLOCAINE) 1 % injection 10 mL  . ropivacaine (PF) 2 mg/mL (0.2%) (NAROPIN) injection 9 mL  . ropivacaine (PF) 2 mg/mL (0.2%) (NAROPIN) injection 9 mL  . triamcinolone acetonide (KENALOG-40) injection 40 mg   Medications administered: We administered ropivacaine (PF) 2 mg/mL (0.2%), fentaNYL, triamcinolone acetonide, midazolam, and lactated ringers.  See the medical record for exact dosing, route, and time of administration.  Lab-work, Procedure(s), & Referral(s) Ordered: Orders Placed This Encounter  Procedures  . DG C-Arm 1-60 Min-No Report  . Discharge instructions  . Follow-up  . Informed Consent Details: Transcribe to consent form and obtain patient signature  . Provider attestation of informed consent for procedure/surgical case  . Verify informed consent   Imaging Ordered: No results found for this or any previous visit. New Prescriptions   No medications on file   Primary Care Physician: Steele Sizer, MD Location: [facility] Note by: Sydnee Levans. Laban Emperor, M.D, DABA, DABAPM, DABPM, DABIPP, FIPP Date: 10/07/2016; Time: 10:34 AM  Disclaimer:  Medicine is not an exact science. The only guarantee in medicine is that nothing is guaranteed. It is important to note that the decision to proceed with this intervention was based on the information collected from the patient. The Data and conclusions were drawn from the patient's questionnaire, the interview, and the physical examination. Because the information was provided in large part by the patient, it cannot be guaranteed that it has not been purposely or unconsciously manipulated. Every effort has been made to obtain as much relevant data as possible for this evaluation. It is important to note that the conclusions that lead to this procedure are derived in large part from the available data. Always take into account that the treatment will also be dependent on availability of resources and existing treatment guidelines, considered by other Pain Management Practitioners as being common knowledge and practice, at the time of the intervention. For Medico-Legal purposes, it is also important to point out that variation in procedural techniques and pharmacological choices are the acceptable norm. The indications, contraindications, technique, and results of the above procedure should only be interpreted and judged by a Board-Certified Interventional Pain Specialist with extensive familiarity and expertise in the same exact procedure and technique. Attempts at providing opinions without similar or greater experience and expertise than that of the treating physician will be considered as inappropriate and unethical, and shall result in a formal complaint to the state medical board and applicable specialty societies.  Instructions provided at this appointment: Patient Instructions      Pain Management Discharge Instructions  General Discharge Instructions  :  If you need to reach your doctor call: Monday-Friday 8:00 am - 4:00 pm at [phone] or toll free [phone].  After clinic hours [phone] to have operator reach doctor.  Bring all of your medication bottles to all your appointments in the pain clinic.  To cancel or reschedule your appointment with Pain Management please remember to call 24 hours in advance to avoid a fee.  Refer to the educational materials  which you have been given on: General Risks, I had my Procedure. Discharge Instructions, Post Sedation.  Post Procedure Instructions:  The drugs you were given will stay in your system until tomorrow, so for the next 24 hours you should not drive, make any legal decisions or drink any alcoholic beverages.  You may eat anything you prefer, but it is better to start with liquids then soups and crackers, and gradually work up to solid foods.  Please notify your doctor immediately if you have any unusual bleeding, trouble breathing or pain that is not related to your normal pain.  Depending on the type of procedure that was done, some parts of your body may feel week and/or numb.  This usually clears up by tonight or the next day.  Walk with the use of an assistive device or accompanied by an adult for the 24 hours.  You may use ice on the affected area for the first 24 hours.  Put ice in a Ziploc bag and cover with a towel and place against area 15 minutes on 15 minutes off.  You may switch to heat after 24 hours. Opioid Overdose Introduction Opioids are substances that relieve pain by binding to pain receptors in your brain and spinal cord. Opioids include illegal drugs, such as heroin, as well as prescription pain medicines.An opioid overdose happens when you take too much of an opioid substance. This can happen with any type of opioid,  including:  Heroin.  Morphine.  Codeine.  Methadone.  Oxycodone.  Hydrocodone.  Fentanyl.  Hydromorphone.  Buprenorphine. The effects of an overdose can be mild, dangerous, or even deadly. Opioid overdose is a medical emergency. What are the causes? This condition may be caused by:  Taking too much of an opioid by accident.  Taking too much of an opioid on purpose.  An error made by a health care provider who prescribes a medicine.  An error made by the pharmacist who fills the prescription order.  Using more than one substance that contains opioids at the same time.  Mixing an opioid with a substance that affects your heart, breathing, or blood pressure. These include alcohol, tranquilizers, sleeping pills, illegal drugs, and some over-the-counter medicines. What increases the risk? This condition is more likely in:  Children. They may be attracted to colorful pills. Because of a child's small size, even a small amount of a drug can be dangerous.  Elderly people. They may be taking many different drugs. Elderly people may have difficulty reading labels or remembering when they last took their medicine.  People who take an opioid on a long-term basis.  People who use:  Illegal drugs.  Other substances, including alcohol, while using an opioid.  People who have:  A history of drug or alcohol abuse.  Certain mental health conditions.  People who take opioids that are not prescribed for them. What are the signs or symptoms? Symptoms of this condition depend on the type of opioid and the amount that was taken. Common symptoms include:  Sleepiness or difficulty waking from sleep.  Confusion.  Slurred speech.  Slowed breathing and a slow pulse.  Nausea and vomiting.  Abnormally small pupils. Signs and symptoms that require emergency treatment include:  Cold, clammy, and pale skin.  Blue lips and fingernails.  Vomiting.  Gurgling sounds in the  throat.  A pulse that is very slow or difficult to detect.  Breathing that is very slow, noisy, or difficult to detect.  Limp body.  Inability to respond to speech or be awakened from sleep (stupor). How is this diagnosed? This condition is diagnosed based on your symptoms. It is important to tell your health care provider:  All of the opioidsthat you took.  When you took the opioids.  Whether you were drinking alcohol or using other substances. Your health care provider will do a physical exam. This exam may include:  Checking and monitoring your heart rate and rhythm, your breathing rate and depth, your temperature, and your blood pressure (vital signs).  Checking for abnormally small pupils.  Measuring oxygen levels in your blood. You may also have blood tests or urine tests. How is this treated? Supporting your vital signs and your breathing is the first step in treating an opioid overdose. Treatment may also include:  Giving fluids and minerals (electrolytes) through an IV tube.  Inserting a breathing tube (endotracheal tube) in your airway to help you breathe.  Giving oxygen.  Passing a tube through your nose and into your stomach (NG tube, or nasogastric tube) to wash out your stomach.  Giving medicines that:  Increase your blood pressure.  Absorb any opioid that is in your digestive system.  Reverse the effects of the opioid (naloxone).  Ongoing counseling and mental health support if you intentionally overdosed or used an illegal drug. Follow these instructions at home:  Take over-the-counter and prescription medicines only as told by your health care provider. Always ask your health care provider about possible side effects and interactions of any new medicine that you start taking.  Keep a list of all of the medicines that you take, including over-the-counter medicines. Bring this list with you to all of your medical visits.  Drink enough fluid to keep  your urine clear or pale yellow.  Keep all follow-up visits as told by your health care provider. This is important. How is this prevented?  Get help if you are struggling with:  Alcohol or drug use.  Depression or another mental health problem.  Keep the phone number of your local poison control center near your phone or on your cell phone.  Store all medicines in safety containers that are out of the reach of children.  Read the drug inserts that come with your medicines.  Do not drink alcohol when taking opioids.  Do not use illegal drugs.  Do not take opioid medicines that are not prescribed for you. Contact a health care provider if:  Your symptoms return.  You develop new symptoms or side effects when you are taking medicines. Get help right away if:  You think that you or someone else may have taken too much of an opioid. The hotline of the [facility] is 478 070 2561.  You or someone else is having symptoms of an opioid overdose.  You have serious thoughts about hurting yourself or others.  You have:  Chest pain.  Difficulty breathing.  A loss of consciousness. Opioid overdose is an emergency. Do not wait to see if the symptoms will go away. Get medical help right away. Call your local emergency services (911 in the [place] Do not drive yourself to the hospital.  This information is not intended to replace advice given to you by your health care provider. Make sure you discuss any questions you have with your health care provider. Document Released: 09/24/2004 Document Revised: 01/23/2016 Document Reviewed: 08/30/2013  2017 Elsevier  Facet Blocks Patient Information  Description: The facets are joints in the spine between the  vertebrae.  Like any joints in the body, facets can become irritated and painful.  Arthritis can also effect the facets.  By injecting steroids and local anesthetic in and around these joints, we can temporarily  block the nerve supply to them.  Steroids act directly on irritated nerves and tissues to reduce selling and inflammation which often leads to decreased pain.  Facet blocks may be done anywhere along the spine from the neck to the low back depending upon the location of your pain.   After numbing the skin with local anesthetic (like Novocaine), a small needle is passed onto the facet joints under x-ray guidance.  You may experience a sensation of pressure while this is being done.  The entire block usually lasts about 15-25 minutes.   Conditions which may be treated by facet blocks:   Low back/buttock pain  Neck/shoulder pain  Certain types of headaches  Preparation for the injection:  1. Do not eat any solid food or dairy products within 8 hours of your appointment. 2. You may drink clear liquid up to 3 hours before appointment.  Clear liquids include water, black coffee, juice or soda.  No milk or cream please. 3. You may take your regular medication, including pain medications, with a sip of water before your appointment.  Diabetics should hold regular insulin (if taken separately) and take 1/2 normal NPH dose the morning of the procedure.  Carry some sugar containing items with you to your appointment. 4. A driver must accompany you and be prepared to drive you home after your procedure. 5. Bring all your current medications with you. 6. An IV may be inserted and sedation may be given at the discretion of the physician. 7. A blood pressure cuff, EKG and other monitors will often be applied during the procedure.  Some patients may need to have extra oxygen administered for a short period. 8. You will be asked to provide medical information, including your allergies and medications, prior to the procedure.  We must know immediately if you are taking blood thinners (like Coumadin/Warfarin) or if you are allergic to IV iodine contrast (dye).  We must know if you could possible be  pregnant.  Possible side-effects:   Bleeding from needle site  Infection (rare, may require surgery)  Nerve injury (rare)  Numbness & tingling (temporary)  Difficulty urinating (rare, temporary)  Spinal headache (a headache worse with upright posture)  Light-headedness (temporary)  Pain at injection site (serveral days)  Decreased blood pressure (rare, temporary)  Weakness in arm/leg (temporary)  Pressure sensation in back/neck (temporary)   Call if you experience:   Fever/chills associated with headache or increased back/neck pain  Headache worsened by an upright position  New onset, weakness or numbness of an extremity below the injection site  Hives or difficulty breathing (go to the emergency room)  Inflammation or drainage at the injection site(s)  Severe back/neck pain greater than usual  New symptoms which are concerning to you  Please note:  Although the local anesthetic injected can often make your back or neck feel good for several hours after the injection, the pain will likely return. It takes 3-7 days for steroids to work.  You may not notice any pain relief for at least one week.  If effective, we will often do a series of 2-3 injections spaced 3-6 weeks apart to maximally decrease your pain.  After the initial series, you may be a candidate for a more permanent nerve block of the  facets.  If you have any questions, please call #[phone]) [phone] [facility]

## 2016-10-07 NOTE — Progress Notes (Signed)
Safety precautions to be maintained throughout the outpatient stay will include: orient to surroundings, keep bed in low position, maintain call bell within reach at all times, provide assistance with transfer out of bed and ambulation.  

## 2016-10-07 NOTE — Patient Instructions (Addendum)
Pain Management Discharge Instructions  General Discharge Instructions :  If you need to reach your doctor call: Monday-Friday 8:00 am - 4:00 pm at [phone] or toll free [phone].  After clinic hours [phone] to have operator reach doctor.  Bring all of your medication bottles to all your appointments in the pain clinic.  To cancel or reschedule your appointment with Pain Management please remember to call 24 hours in advance to avoid a fee.  Refer to the educational materials which you have been given on: General Risks, I had my Procedure. Discharge Instructions, Post Sedation.  Post Procedure Instructions:  The drugs you were given will stay in your system until tomorrow, so for the next 24 hours you should not drive, make any legal decisions or drink any alcoholic beverages.  You may eat anything you prefer, but it is better to start with liquids then soups and crackers, and gradually work up to solid foods.  Please notify your doctor immediately if you have any unusual bleeding, trouble breathing or pain that is not related to your normal pain.  Depending on the type of procedure that was done, some parts of your body may feel week and/or numb.  This usually clears up by tonight or the next day.  Walk with the use of an assistive device or accompanied by an adult for the 24 hours.  You may use ice on the affected area for the first 24 hours.  Put ice in a Ziploc bag and cover with a towel and place against area 15 minutes on 15 minutes off.  You may switch to heat after 24 hours. Opioid Overdose Introduction Opioids are substances that relieve pain by binding to pain receptors in your brain and spinal cord. Opioids include illegal drugs, such as heroin, as well as prescription pain medicines.An opioid overdose happens when you take too much of an opioid substance. This can happen with any type of opioid,  including:  Heroin.  Morphine.  Codeine.  Methadone.  Oxycodone.  Hydrocodone.  Fentanyl.  Hydromorphone.  Buprenorphine. The effects of an overdose can be mild, dangerous, or even deadly. Opioid overdose is a medical emergency. What are the causes? This condition may be caused by:  Taking too much of an opioid by accident.  Taking too much of an opioid on purpose.  An error made by a health care provider who prescribes a medicine.  An error made by the pharmacist who fills the prescription order.  Using more than one substance that contains opioids at the same time.  Mixing an opioid with a substance that affects your heart, breathing, or blood pressure. These include alcohol, tranquilizers, sleeping pills, illegal drugs, and some over-the-counter medicines. What increases the risk? This condition is more likely in:  Children. They may be attracted to colorful pills. Because of a child's small size, even a small amount of a drug can be dangerous.  Elderly people. They may be taking many different drugs. Elderly people may have difficulty reading labels or remembering when they last took their medicine.  People who take an opioid on a long-term basis.  People who use:  Illegal drugs.  Other substances, including alcohol, while using an opioid.  People who have:  A history of drug or alcohol abuse.  Certain mental health conditions.  People who take opioids that are not prescribed for them. What are the signs or symptoms? Symptoms of this condition depend on the type of opioid and the amount that  was taken. Common symptoms include:  Sleepiness or difficulty waking from sleep.  Confusion.  Slurred speech.  Slowed breathing and a slow pulse.  Nausea and vomiting.  Abnormally small pupils. Signs and symptoms that require emergency treatment include:  Cold, clammy, and pale skin.  Blue lips and fingernails.  Vomiting.  Gurgling sounds in the  throat.  A pulse that is very slow or difficult to detect.  Breathing that is very slow, noisy, or difficult to detect.  Limp body.  Inability to respond to speech or be awakened from sleep (stupor). How is this diagnosed? This condition is diagnosed based on your symptoms. It is important to tell your health care provider:  All of the opioidsthat you took.  When you took the opioids.  Whether you were drinking alcohol or using other substances. Your health care provider will do a physical exam. This exam may include:  Checking and monitoring your heart rate and rhythm, your breathing rate and depth, your temperature, and your blood pressure (vital signs).  Checking for abnormally small pupils.  Measuring oxygen levels in your blood. You may also have blood tests or urine tests. How is this treated? Supporting your vital signs and your breathing is the first step in treating an opioid overdose. Treatment may also include:  Giving fluids and minerals (electrolytes) through an IV tube.  Inserting a breathing tube (endotracheal tube) in your airway to help you breathe.  Giving oxygen.  Passing a tube through your nose and into your stomach (NG tube, or nasogastric tube) to wash out your stomach.  Giving medicines that:  Increase your blood pressure.  Absorb any opioid that is in your digestive system.  Reverse the effects of the opioid (naloxone).  Ongoing counseling and mental health support if you intentionally overdosed or used an illegal drug. Follow these instructions at home:  Take over-the-counter and prescription medicines only as told by your health care provider. Always ask your health care provider about possible side effects and interactions of any new medicine that you start taking.  Keep a list of all of the medicines that you take, including over-the-counter medicines. Bring this list with you to all of your medical visits.  Drink enough fluid to keep  your urine clear or pale yellow.  Keep all follow-up visits as told by your health care provider. This is important. How is this prevented?  Get help if you are struggling with:  Alcohol or drug use.  Depression or another mental health problem.  Keep the phone number of your local poison control center near your phone or on your cell phone.  Store all medicines in safety containers that are out of the reach of children.  Read the drug inserts that come with your medicines.  Do not drink alcohol when taking opioids.  Do not use illegal drugs.  Do not take opioid medicines that are not prescribed for you. Contact a health care provider if:  Your symptoms return.  You develop new symptoms or side effects when you are taking medicines. Get help right away if:  You think that you or someone else may have taken too much of an opioid. The hotline of the [facility] is 952-836-1311.  You or someone else is having symptoms of an opioid overdose.  You have serious thoughts about hurting yourself or others.  You have:  Chest pain.  Difficulty breathing.  A loss of consciousness. Opioid overdose is an emergency. Do not wait to  see if the symptoms will go away. Get medical help right away. Call your local emergency services (911 in the [place] Do not drive yourself to the hospital.  This information is not intended to replace advice given to you by your health care provider. Make sure you discuss any questions you have with your health care provider. Document Released: 09/24/2004 Document Revised: 01/23/2016 Document Reviewed: 08/30/2013  2017 [employer]  Facet Blocks Patient Information  Description: The facets are joints in the spine between the vertebrae.  Like any joints in the body, facets can become irritated and painful.  Arthritis can also effect the facets.  By injecting steroids and local anesthetic in and around these joints, we can temporarily  block the nerve supply to them.  Steroids act directly on irritated nerves and tissues to reduce selling and inflammation which often leads to decreased pain.  Facet blocks may be done anywhere along the spine from the neck to the low back depending upon the location of your pain.   After numbing the skin with local anesthetic (like Novocaine), a small needle is passed onto the facet joints under x-ray guidance.  You may experience a sensation of pressure while this is being done.  The entire block usually lasts about 15-25 minutes.   Conditions which may be treated by facet blocks:   Low back/buttock pain  Neck/shoulder pain  Certain types of headaches  Preparation for the injection:  1. Do not eat any solid food or dairy products within 8 hours of your appointment. 2. You may drink clear liquid up to 3 hours before appointment.  Clear liquids include water, black coffee, juice or soda.  No milk or cream please. 3. You may take your regular medication, including pain medications, with a sip of water before your appointment.  Diabetics should hold regular insulin (if taken separately) and take 1/2 normal NPH dose the morning of the procedure.  Carry some sugar containing items with you to your appointment. 4. A driver must accompany you and be prepared to drive you home after your procedure. 5. Bring all your current medications with you. 6. An IV may be inserted and sedation may be given at the discretion of the physician. 7. A blood pressure cuff, EKG and other monitors will often be applied during the procedure.  Some patients may need to have extra oxygen administered for a short period. 8. You will be asked to provide medical information, including your allergies and medications, prior to the procedure.  We must know immediately if you are taking blood thinners (like Coumadin/Warfarin) or if you are allergic to IV iodine contrast (dye).  We must know if you could possible be  pregnant.  Possible side-effects:   Bleeding from needle site  Infection (rare, may require surgery)  Nerve injury (rare)  Numbness & tingling (temporary)  Difficulty urinating (rare, temporary)  Spinal headache (a headache worse with upright posture)  Light-headedness (temporary)  Pain at injection site (serveral days)  Decreased blood pressure (rare, temporary)  Weakness in arm/leg (temporary)  Pressure sensation in back/neck (temporary)   Call if you experience:   Fever/chills associated with headache or increased back/neck pain  Headache worsened by an upright position  New onset, weakness or numbness of an extremity below the injection site  Hives or difficulty breathing (go to the emergency room)  Inflammation or drainage at the injection site(s)  Severe back/neck pain greater than usual  New symptoms which are concerning to you  Please note:  Although the local anesthetic injected can often make your back or neck feel good for several hours after the injection, the pain will likely return. It takes 3-7 days for steroids to work.  You may not notice any pain relief for at least one week.  If effective, we will often do a series of 2-3 injections spaced 3-6 weeks apart to maximally decrease your pain.  After the initial series, you may be a candidate for a more permanent nerve block of the facets.  If you have any questions, please call #[phone]) [facility]

## 2016-10-08 ENCOUNTER — Telehealth: Payer: Self-pay | Admitting: *Deleted

## 2016-10-08 NOTE — Telephone Encounter (Signed)
No problems post procedure. 

## 2016-10-22 ENCOUNTER — Encounter: Payer: Self-pay | Admitting: Pain Medicine

## 2016-10-22 ENCOUNTER — Ambulatory Visit: Payer: Medicare Other | Attending: Pain Medicine | Admitting: Pain Medicine

## 2016-10-22 VITALS — BP 162/71 | HR 67 | Temp 98.2°F | Resp 20 | Ht 60.0 in | Wt 222.0 lb

## 2016-10-22 DIAGNOSIS — I10 Essential (primary) hypertension: Secondary | ICD-10-CM | POA: Insufficient documentation

## 2016-10-22 DIAGNOSIS — E1140 Type 2 diabetes mellitus with diabetic neuropathy, unspecified: Secondary | ICD-10-CM | POA: Diagnosis not present

## 2016-10-22 DIAGNOSIS — E669 Obesity, unspecified: Secondary | ICD-10-CM | POA: Insufficient documentation

## 2016-10-22 DIAGNOSIS — M533 Sacrococcygeal disorders, not elsewhere classified: Secondary | ICD-10-CM | POA: Insufficient documentation

## 2016-10-22 DIAGNOSIS — M1288 Other specific arthropathies, not elsewhere classified, other specified site: Secondary | ICD-10-CM | POA: Diagnosis present

## 2016-10-22 DIAGNOSIS — M797 Fibromyalgia: Secondary | ICD-10-CM | POA: Insufficient documentation

## 2016-10-22 DIAGNOSIS — M25561 Pain in right knee: Secondary | ICD-10-CM | POA: Diagnosis not present

## 2016-10-22 DIAGNOSIS — M1712 Unilateral primary osteoarthritis, left knee: Secondary | ICD-10-CM | POA: Diagnosis not present

## 2016-10-22 DIAGNOSIS — J302 Other seasonal allergic rhinitis: Secondary | ICD-10-CM | POA: Insufficient documentation

## 2016-10-22 DIAGNOSIS — G894 Chronic pain syndrome: Secondary | ICD-10-CM

## 2016-10-22 DIAGNOSIS — E7800 Pure hypercholesterolemia, unspecified: Secondary | ICD-10-CM | POA: Diagnosis not present

## 2016-10-22 DIAGNOSIS — M25512 Pain in left shoulder: Secondary | ICD-10-CM | POA: Diagnosis not present

## 2016-10-22 DIAGNOSIS — M25562 Pain in left knee: Secondary | ICD-10-CM | POA: Diagnosis not present

## 2016-10-22 DIAGNOSIS — M545 Low back pain: Secondary | ICD-10-CM | POA: Insufficient documentation

## 2016-10-22 DIAGNOSIS — F329 Major depressive disorder, single episode, unspecified: Secondary | ICD-10-CM | POA: Insufficient documentation

## 2016-10-22 DIAGNOSIS — M5481 Occipital neuralgia: Secondary | ICD-10-CM | POA: Diagnosis not present

## 2016-10-22 DIAGNOSIS — F1190 Opioid use, unspecified, uncomplicated: Secondary | ICD-10-CM

## 2016-10-22 DIAGNOSIS — F419 Anxiety disorder, unspecified: Secondary | ICD-10-CM | POA: Insufficient documentation

## 2016-10-22 DIAGNOSIS — M25572 Pain in left ankle and joints of left foot: Secondary | ICD-10-CM | POA: Insufficient documentation

## 2016-10-22 DIAGNOSIS — G4700 Insomnia, unspecified: Secondary | ICD-10-CM | POA: Diagnosis not present

## 2016-10-22 DIAGNOSIS — J45909 Unspecified asthma, uncomplicated: Secondary | ICD-10-CM | POA: Insufficient documentation

## 2016-10-22 DIAGNOSIS — G8929 Other chronic pain: Secondary | ICD-10-CM

## 2016-10-22 DIAGNOSIS — L304 Erythema intertrigo: Secondary | ICD-10-CM | POA: Diagnosis not present

## 2016-10-22 DIAGNOSIS — M7060 Trochanteric bursitis, unspecified hip: Secondary | ICD-10-CM | POA: Diagnosis not present

## 2016-10-22 DIAGNOSIS — M47816 Spondylosis without myelopathy or radiculopathy, lumbar region: Secondary | ICD-10-CM

## 2016-10-22 DIAGNOSIS — Z79891 Long term (current) use of opiate analgesic: Secondary | ICD-10-CM

## 2016-10-22 DIAGNOSIS — M8580 Other specified disorders of bone density and structure, unspecified site: Secondary | ICD-10-CM | POA: Diagnosis not present

## 2016-10-22 DIAGNOSIS — Z96651 Presence of right artificial knee joint: Secondary | ICD-10-CM | POA: Diagnosis not present

## 2016-10-22 DIAGNOSIS — M25511 Pain in right shoulder: Secondary | ICD-10-CM | POA: Insufficient documentation

## 2016-10-22 MED ORDER — OXYCODONE HCL 5 MG PO TABS: 5.0000 mg | ORAL_TABLET | Freq: Two times a day (BID) | ORAL | 0 refills | Status: DC

## 2016-10-22 NOTE — Progress Notes (Signed)
Nursing Pain Medication Assessment:  Safety precautions to be maintained throughout the outpatient stay will include: orient to surroundings, keep bed in low position, maintain call bell within reach at all times, provide assistance with transfer out of bed and ambulation.  Medication Inspection Compliance: Pill count conducted under aseptic conditions, in front of the patient. Neither the pills nor the bottle was removed from the patient's sight at any time. Once count was completed pills were immediately returned to the patient in their original bottle.  Medication: Oxycodone IR Pill/Patch Count: 5 of 60 pills remain Bottle Appearance: Standard pharmacy container. Clearly labeled. Filled Date: 01 / 25/ 2018 Last Medication intake:  Yesterday

## 2016-10-22 NOTE — Progress Notes (Signed)
Patient's Name: Cynthia Jones  MRN: [id number]  Referring Provider: Steele Sizer, MD  DOB: 09/03/1940  PCP: Steele Sizer, MD  DOS: 10/22/2016  Note by: Sydnee Levans. Laban Emperor, MD  Service setting: Ambulatory outpatient  Specialty: Interventional Pain Management  Location: [facility] (AMB) Pain Management Facility    Patient type: Established   Primary Reason(s) for Visit: Encounter for post-procedure evaluation of chronic illness with mild to moderate exacerbation CC: Shoulder Pain (bilateral) and Neck Pain  HPI  Cynthia Jones is a 76 y.o. year old, female patient, who comes today for a post-procedure evaluation. She has Osteopenia; Depression; Essential hypertension; Diabetes mellitus (HCC); Degenerative joint disease of knee; Osteoarthritis; Seasonal allergic rhinitis; Obesity; Intertrigo; Fibromyalgia; Bilateral occipital neuralgia; Greater trochanteric bursitis; Osteoarthritis of knee (Location of Primary Source of Pain) (Left); Sacroiliac joint disease; Lumbar facet syndrome (Bilateral) (L>R); Neuropathy due to secondary diabetes (HCC); S/P TKR Arthroplasty (Right); Folliculitis; Insomnia; Cough; Long term current use of opiate analgesic; Long term prescription opiate use; Opiate use; Long term prescription benzodiazepine use; Chronic pain syndrome; Chronic low back pain (Location of Secondary source of pain) (Bilateral) (L>R); Chronic knee pain (Location of Primary Source of Pain) (Bilateral) (L>R); Cervicogenic headache; Hip pain, chronic, unspecified laterality; Chronic hand pain (B) (R>L); Chronic foot pain, left; Hypercholesteremia; and Lumbar spondylosis on her problem list. Her primarily concern today is the Shoulder Pain (bilateral) and Neck Pain  Pain Assessment: Self-Reported Pain Score: 3 /10             Reported level is compatible with observation.       Pain Type: Chronic pain Pain Location: Shoulder Pain Orientation: Right, Left Pain Descriptors / Indicators: Dull Pain  Frequency: Constant  Cynthia Jones comes in today for post-procedure evaluation after the treatment done on 10/07/2016.  Further details on both, my assessment(s), as well as the proposed treatment plan, please see below.  Post-Procedure Assessment  10/07/2016 Procedure: Diagnostic bilateral lumbar facet block #1 on the fluoroscopic guidance and IV sedation. Post-procedure pain score: 1/10 The patient initially indicated her pain to be a 4/10 and after the procedure she indicated that it had gone down to 1/10. Influential Factors: BMI: 43.36 kg/m Intra-procedural challenges: None observed Assessment challenges: None detected         Post-procedural side-effects, adverse reactions, or complications: None reported Reported issues: None  Sedation: Sedation provided. When no sedatives are used, the analgesic levels obtained are directly associated to the effectiveness of the local anesthetics. However, when sedation is provided, the level of analgesia obtained during the initial 1 hour following the intervention, is believed to be the result of a combination of factors. These factors may include, but are not limited to: 1. The effectiveness of the local anesthetics used. 2. The effects of the analgesic(s) and/or anxiolytic(s) used. 3. The degree of discomfort experienced by the patient at the time of the procedure. 4. The patients ability and reliability in recalling and recording the events. 5. The presence and influence of possible secondary gains and/or psychosocial factors. Reported result: Relief experienced during the 1st hour after the procedure: 100 % (Ultra-Short Term Relief) Interpretative annotation: Analgesia during this period is likely to be Local Anesthetic and/or IV Sedative (Analgesic/Anxiolitic) related.          Effects of local anesthetic: The analgesic effects attained during this period are directly associated to the localized infiltration of local anesthetics and therefore  cary significant diagnostic value as to the etiological location, or anatomical origin, of the  pain. Expected duration of relief is directly dependent on the pharmacodynamics of the local anesthetic used. Long-acting (4-6 hours) anesthetics used.  Reported result: Relief during the next 4 to 6 hour after the procedure: 100 % (Short-Term Relief) Interpretative annotation: Complete relief would suggest area to be the source of the pain.          Long-term benefit: Defined as the period of time past the expected duration of local anesthetics. With the possible exception of prolonged sympathetic blockade from the local anesthetics, benefits during this period are typically attributed to, or associated with, other factors such as analgesic sensory neuropraxia, antiinflammatory effects, or beneficial biochemical changes provided by agents other than the local anesthetics Reported result: Extended relief following procedure: 100 % (Long-Term Relief) Interpretative annotation: Good relief. This could suggest inflammation to be a significant component in the etiology to the pain.          Current benefits: Defined as persistent relief that continues at this point in time.   Reported results: Treated area: 100 % Cynthia Jones reports improvement in function Interpretative annotation: Ongoing benefits would suggest effective therapeutic approach  Interpretation: Results would suggest a successful diagnostic intervention.          Laboratory Chemistry  Inflammation Markers Lab Results  Component Value Date   ESRSEDRATE 44 (H) 09/24/2016   CRP 1.1 (H) 09/24/2016   Renal Function Lab Results  Component Value Date   BUN 12 04/08/2016   CREATININE 0.75 04/08/2016   GFRAA 91 04/08/2016   GFRNONAA 79 04/08/2016   Hepatic Function Lab Results  Component Value Date   AST 15 04/08/2016   ALT 13 04/08/2016   ALBUMIN 3.8 04/08/2016   Electrolytes Lab Results  Component Value Date   NA 140 04/08/2016    K 4.2 04/08/2016   CL 101 04/08/2016   CALCIUM 9.3 04/08/2016   MG 2.1 09/24/2016   Pain Modulating Vitamins Lab Results  Component Value Date   25OHVITD1 20 (L) 09/24/2016   25OHVITD2 <1.0 09/24/2016   25OHVITD3 19 09/24/2016   VITAMINB12 344 09/24/2016   Coagulation Parameters Lab Results  Component Value Date   PLT 314 04/08/2016   Cardiovascular Lab Results  Component Value Date   HCT 39.6 04/08/2016   Note: Lab results reviewed.  Recent Diagnostic Imaging Review  Dg C-arm 1-60 Min-no Report  Result Date: 10/07/2016 Fluoroscopy was utilized by the requesting physician.  No radiographic interpretation.   Note: Imaging results reviewed.          Meds  The patient has a current medication list which includes the following prescription(s): alprazolam, carvedilol, duloxetine, esomeprazole, losartan, and oxycodone.  Current Outpatient Prescriptions on File Prior to Visit  Medication Sig  . ALPRAZolam (XANAX) 0.25 MG tablet Take 1 tablet (0.25 mg total) by mouth 2 (two) times daily as needed for anxiety.  . carvedilol (COREG) 12.5 MG tablet Take 1 tablet (12.5 mg total) by mouth 2 (two) times daily with a meal.  . DULoxetine (CYMBALTA) 60 MG capsule Take 1 capsule (60 mg total) by mouth daily.  [name] esomeprazole (NEXIUM) 40 MG capsule Take 40 mg by mouth daily at 12 noon.  [name] losartan (COZAAR) 100 MG tablet Take 1 tablet (100 mg total) by mouth daily.   No current facility-administered medications on file prior to visit.    ROS  Constitutional: Denies any fever or chills Gastrointestinal: No reported hemesis, hematochezia, vomiting, or acute GI distress Musculoskeletal: Denies any acute onset joint swelling, redness,  loss of ROM, or weakness Neurological: No reported episodes of acute onset apraxia, aphasia, dysarthria, agnosia, amnesia, paralysis, loss of coordination, or loss of consciousness  Allergies  Cynthia Jones is allergic to nsaids and erythromycin.  PFSH   Drug: Cynthia Jones  reports that she does not use drugs. Alcohol:  reports that she does not drink alcohol. Tobacco:  reports that she has never smoked. She has never used smokeless tobacco. Medical:  has a past medical history of Acute anxiety (03/06/2015); Allergy; Anxiety; Degenerative joint disease of knee; Depression; Fibromyalgia; Obesity; Osteopenia; and RAD (reactive airway disease). Family: family history includes Alzheimer's disease in her father; Cancer in her sister; Diabetes in her mother; Heart disease in her mother; Parkinson's disease in her father; Stroke in her mother.  Past Surgical History:  Procedure Laterality Date  . ABDOMINAL HYSTERECTOMY    . APPENDECTOMY    . CHOLECYSTECTOMY    . EYE SURGERY    . JOINT REPLACEMENT  2014   right knee  . PAROTIDECTOMY Left   . TONSILLECTOMY     Constitutional Exam  General appearance: Well nourished, well developed, and well hydrated. In no apparent acute distress Vitals:   10/22/16 1139  BP: (!) 162/71  Pulse: 67  Resp: 20  Temp: 98.2 F (36.8 C)  TempSrc: Oral  SpO2: 100%  Weight: 222 lb (100.7 kg)  Height: 5' (1.524 m)   BMI Assessment: Estimated body mass index is 43.36 kg/m as calculated from the following:   Height as of this encounter: 5' (1.524 m).   Weight as of this encounter: 222 lb (100.7 kg).  BMI interpretation table: BMI level Category Range association with higher incidence of chronic pain  <18 kg/m2 Underweight   18.5-24.9 kg/m2 Ideal body weight   25-29.9 kg/m2 Overweight Increased incidence by 20%  30-34.9 kg/m2 Obese (Class I) Increased incidence by 68%  35-39.9 kg/m2 Severe obesity (Class II) Increased incidence by 136%  >40 kg/m2 Extreme obesity (Class III) Increased incidence by 254%   BMI Readings from Last 4 Encounters:  10/22/16 43.36 kg/m  10/07/16 43.36 kg/m  09/24/16 43.36 kg/m  09/08/16 43.16 kg/m   Wt Readings from Last 4 Encounters:  10/22/16 222 lb (100.7 kg)  10/07/16  222 lb (100.7 kg)  09/24/16 222 lb (100.7 kg)  09/08/16 221 lb (100.2 kg)  Psych/Mental status: Alert, oriented x 3 (person, place, & time)       Eyes: PERLA Respiratory: No evidence of acute respiratory distress  Cervical Spine Exam  Inspection: No masses, redness, or swelling Alignment: Symmetrical Functional ROM: Unrestricted ROM Stability: No instability detected Muscle strength & Tone: Functionally intact Sensory: Unimpaired Palpation: Non-contributory  Upper Extremity (UE) Exam    Side: Right upper extremity  Side: Left upper extremity  Inspection: No masses, redness, swelling, or asymmetry. No contractures  Inspection: No masses, redness, swelling, or asymmetry. No contractures  Functional ROM: Unrestricted ROM          Functional ROM: Unrestricted ROM          Muscle strength & Tone: Functionally intact  Muscle strength & Tone: Functionally intact  Sensory: Unimpaired  Sensory: Unimpaired  Palpation: Euthermic  Palpation: Euthermic  Specialized Test(s): Deferred         Specialized Test(s): Deferred          Thoracic Spine Exam  Inspection: No masses, redness, or swelling Alignment: Symmetrical Functional ROM: Unrestricted ROM Stability: No instability detected Sensory: Unimpaired Muscle strength & Tone: Functionally intact Palpation: Non-contributory  Lumbar Spine Exam  Inspection: No masses, redness, or swelling Alignment: Symmetrical Functional ROM: Unrestricted ROM Stability: No instability detected Muscle strength & Tone: Functionally intact Sensory: Unimpaired Palpation: Non-contributory Provocative Tests: Lumbar Hyperextension and rotation test: evaluation deferred today       Patrick's Maneuver: evaluation deferred today              Gait & Posture Assessment  Ambulation: Unassisted Gait: Relatively normal for age and body habitus Posture: WNL   Lower Extremity Exam    Side: Right lower extremity  Side: Left lower extremity  Inspection: No masses,  redness, swelling, or asymmetry. No contractures  Inspection: No masses, redness, swelling, or asymmetry. No contractures  Functional ROM: Unrestricted ROM          Functional ROM: Unrestricted ROM          Muscle strength & Tone: Functionally intact  Muscle strength & Tone: Functionally intact  Sensory: Unimpaired  Sensory: Unimpaired  Palpation: No palpable anomalies  Palpation: No palpable anomalies   Assessment  Primary Diagnosis & Pertinent Problem List: The primary encounter diagnosis was Chronic knee pain (Location of Primary Source of Pain) (Bilateral) (L>R). Diagnoses of Chronic low back pain (Location of Secondary source of pain) (Bilateral) (L>R), Osteoarthritis of knee (Location of Primary Source of Pain) (Left), Lumbar facet syndrome (Bilateral) (L>R), Long term current use of opiate analgesic, Opiate use, Chronic pain syndrome, and Status post total right knee replacement were also pertinent to this visit.  Status Diagnosis  Controlled Controlled Controlled 1. Chronic knee pain (Location of Primary Source of Pain) (Bilateral) (L>R)   2. Chronic low back pain (Location of Secondary source of pain) (Bilateral) (L>R)   3. Osteoarthritis of knee (Location of Primary Source of Pain) (Left)   4. Lumbar facet syndrome (Bilateral) (L>R)   5. Long term current use of opiate analgesic   6. Opiate use   7. Chronic pain syndrome   8. Status post total right knee replacement      Plan of Care  Pharmacotherapy (Medications Ordered): Meds ordered this encounter  Medications  . oxyCODONE (OXY IR/ROXICODONE) 5 MG immediate release tablet    Sig: Take 1 tablet (5 mg total) by mouth 2 (two) times daily.    Dispense:  60 tablet    Refill:  0    Fill one day early if pharmacy is closed on scheduled refill date. Do not fill until: 10/24/16 To last until: 11/23/16   New Prescriptions   No medications on file   Medications administered today: Cynthia Jones had no medications administered  during this visit. Lab-work, procedure(s), and/or referral(s): Orders Placed This Encounter  Procedures  . KNEE INJECTION  . GENICULAR NERVE BLOCK  . LUMBAR FACET(MEDIAL BRANCH NERVE BLOCK) MBNB   Imaging and/or referral(s): None  Interventional therapies: Planned, scheduled, and/or pending:   Diagnostic Right Genicular NB + Left Intra-articular Knee injection with Local & Steroids   Considering:   Diagnostic right Genicular nerve block under fluoroscopic guidance and IV sedation  Possible right Genicular nerve radiofrequency ablation  Diagnostic left intra-articular knee joint injection  Possible series of 5 left intra-articular Hyalgan knee injections  Diagnostic left Genicular nerve block  Possible left Genicular nerve RFA  Diagnostic bilateral lumbar facet block #2 on the fluoroscopic guidance and IV sedation. Possible bilateral lumbar facet RFA  Diagnostic left lumbar epidural steroid injection  Diagnostic caudal epidural steroid injection + epidurogram  Possible Racz procedure  Diagnostic bilateral cervical facet block  Possible bilateral  cervical facet RFA  Diagnostic right versus left cervical epidural steroid injection  Diagnostic bilateral intra-articular shoulder joint injection  Diagnostic bilateral suprascapular nerve block  Possible bilateral suprascapular nerve RFA    Palliative PRN treatment(s):   Diagnostic bilateral lumbar facet block #2 on the fluoroscopic guidance and IV sedation.   Provider-requested follow-up: No Follow-up on file.  Future Appointments Date Time Provider Department Center  11/19/2016 8:30 AM Delano Metz, MD [facility] None   Primary Care Physician: Steele Sizer, MD Location: [facility] Note by: Sydnee Levans. Laban Emperor, M.D, DABA, DABAPM, DABPM, DABIPP, FIPP Date: 10/22/2016; Time: 5:55 PM  Pain Score Disclaimer: We use the NRS-11 scale. This is a self-reported, subjective measurement of pain  severity with only modest accuracy. It is used primarily to identify changes within a particular patient. It must be understood that outpatient pain scales are significantly less accurate that those used for research, where they can be applied under ideal controlled circumstances with minimal exposure to variables. In reality, the score is likely to be a combination of pain intensity and pain affect, where pain affect describes the degree of emotional arousal or changes in action readiness caused by the sensory experience of pain. Factors such as social and work situation, setting, emotional state, anxiety levels, expectation, and prior pain experience may influence pain perception and show large inter-individual differences that may also be affected by time variables.  Patient instructions provided during this appointment: There are no Patient Instructions on file for this visit.

## 2016-11-02 ENCOUNTER — Encounter: Payer: Self-pay | Admitting: Family Medicine

## 2016-11-02 ENCOUNTER — Ambulatory Visit (INDEPENDENT_AMBULATORY_CARE_PROVIDER_SITE_OTHER): Payer: Medicare Other | Admitting: Family Medicine

## 2016-11-02 VITALS — BP 133/75 | HR 75 | Temp 99.1°F | Wt 221.0 lb

## 2016-11-02 DIAGNOSIS — L0292 Furuncle, unspecified: Secondary | ICD-10-CM

## 2016-11-02 DIAGNOSIS — B3731 Acute candidiasis of vulva and vagina: Secondary | ICD-10-CM

## 2016-11-02 DIAGNOSIS — B373 Candidiasis of vulva and vagina: Secondary | ICD-10-CM | POA: Diagnosis not present

## 2016-11-02 MED ORDER — SULFAMETHOXAZOLE-TRIMETHOPRIM 800-160 MG PO TABS: 1.0000 | ORAL_TABLET | Freq: Two times a day (BID) | ORAL | 0 refills | Status: DC

## 2016-11-02 MED ORDER — CHLORHEXIDINE GLUCONATE 4 % EX LIQD: Freq: Every day | CUTANEOUS | 0 refills | Status: DC | PRN

## 2016-11-02 MED ORDER — FLUCONAZOLE 150 MG PO TABS: 150.0000 mg | ORAL_TABLET | Freq: Once | ORAL | 2 refills | Status: DC

## 2016-11-02 NOTE — Progress Notes (Signed)
BP 133/75   Pulse 75   Temp 99.1 F (37.3 C)   Wt 221 lb (100.2 kg)   SpO2 99%   BMI 43.16 kg/m    Subjective:    Patient ID: Cynthia Jones, female    DOB: 06/11/1941, 76 y.o.   MRN: [id number]  HPI: Cynthia Jones is a 76 y.o. female  Chief Complaint  Patient presents with  . Rash    off and on for 2-3 weeks. right forearm, stomach. They heal and then another comes back.  . Vaginitis    x 10 days, tried OTC and no relief. No discharge, just itching.    Patient presents with some boils that have been coming and going intermittently over the years, but the past 2 weeks one has been present on her right forearm and right groin. She has been able to express some thick drainage from both sites, and they are now just red and tender. Has been dressing them with neosporin and cleaning them with dial soap. Pain is well controlled.   Also has been having some vaginal itching. Hx of frequent yeast infections. Has been trying OTC creams with no relief. No discharge, just irritation and itching. Denies dysuria, hematuria, frequency.   Relevant past medical, surgical, family and social history reviewed and updated as indicated. Interim medical history since our last visit reviewed. Allergies and medications reviewed and updated.  Review of Systems  Constitutional: Negative.   HENT: Negative.   Eyes: Negative.   Respiratory: Negative.   Cardiovascular: Negative.   Gastrointestinal: Negative.   Genitourinary:       Vaginal itching  Musculoskeletal: Negative.   Skin: Positive for wound.  Neurological: Negative.   Psychiatric/Behavioral: Negative.     Per HPI unless specifically indicated above     Objective:    BP 133/75   Pulse 75   Temp 99.1 F (37.3 C)   Wt 221 lb (100.2 kg)   SpO2 99%   BMI 43.16 kg/m   Wt Readings from Last 3 Encounters:  11/02/16 221 lb (100.2 kg)  10/22/16 222 lb (100.7 kg)  10/07/16 222 lb (100.7 kg)    Physical Exam    Constitutional: She is oriented to person, place, and time. She appears well-developed and well-nourished. No distress.  HENT:  Head: Atraumatic.  Eyes: Conjunctivae are normal. Pupils are equal, round, and reactive to light.  Neck: Normal range of motion. Neck supple.  Cardiovascular: Normal rate, regular rhythm and normal heart sounds.   Pulmonary/Chest: Effort normal and breath sounds normal. No respiratory distress.  Musculoskeletal: Normal range of motion.  Neurological: She is alert and oriented to person, place, and time.  Skin: Skin is warm and dry.  Erythematous, fully drained boils - one on right forearm and one on right groin/lower abdomen.   Psychiatric: She has a normal mood and affect. Her behavior is normal.  Nursing note and vitals reviewed.     Assessment & Plan:   Problem List Items Addressed This Visit    None    Visit Diagnoses    Boil    -  Primary   Hibiclens and bactrim sent. Continue wound care with neosporin.    Relevant Medications   sulfamethoxazole-trimethoprim (BACTRIM DS,SEPTRA DS) 800-160 MG tablet   fluconazole (DIFLUCAN) 150 MG tablet   Vaginal candidiasis       Diflucan sent. Discussed probiotic, OTC creams prn   Relevant Medications   sulfamethoxazole-trimethoprim (BACTRIM DS,SEPTRA DS) 800-160 MG tablet  fluconazole (DIFLUCAN) 150 MG tablet       Follow up plan: Return if symptoms worsen or fail to improve.

## 2016-11-02 NOTE — Patient Instructions (Signed)
Follow up as scheduled.  

## 2016-11-10 ENCOUNTER — Other Ambulatory Visit: Payer: Self-pay | Admitting: Family Medicine

## 2016-11-11 NOTE — Telephone Encounter (Signed)
Routing to provider  

## 2016-11-19 ENCOUNTER — Encounter: Payer: Self-pay | Admitting: Pain Medicine

## 2016-11-19 ENCOUNTER — Ambulatory Visit: Payer: Medicare Other | Attending: Pain Medicine | Admitting: Pain Medicine

## 2016-11-19 VITALS — BP 139/62 | HR 64 | Temp 98.4°F | Resp 18 | Ht 60.0 in | Wt 222.0 lb

## 2016-11-19 DIAGNOSIS — F329 Major depressive disorder, single episode, unspecified: Secondary | ICD-10-CM | POA: Insufficient documentation

## 2016-11-19 DIAGNOSIS — G894 Chronic pain syndrome: Secondary | ICD-10-CM | POA: Diagnosis not present

## 2016-11-19 DIAGNOSIS — E1140 Type 2 diabetes mellitus with diabetic neuropathy, unspecified: Secondary | ICD-10-CM | POA: Insufficient documentation

## 2016-11-19 DIAGNOSIS — L304 Erythema intertrigo: Secondary | ICD-10-CM | POA: Insufficient documentation

## 2016-11-19 DIAGNOSIS — M25562 Pain in left knee: Secondary | ICD-10-CM | POA: Diagnosis not present

## 2016-11-19 DIAGNOSIS — I10 Essential (primary) hypertension: Secondary | ICD-10-CM | POA: Insufficient documentation

## 2016-11-19 DIAGNOSIS — M8580 Other specified disorders of bone density and structure, unspecified site: Secondary | ICD-10-CM | POA: Insufficient documentation

## 2016-11-19 DIAGNOSIS — Z6841 Body Mass Index (BMI) 40.0 and over, adult: Secondary | ICD-10-CM | POA: Insufficient documentation

## 2016-11-19 DIAGNOSIS — M25552 Pain in left hip: Secondary | ICD-10-CM | POA: Insufficient documentation

## 2016-11-19 DIAGNOSIS — M1712 Unilateral primary osteoarthritis, left knee: Secondary | ICD-10-CM | POA: Insufficient documentation

## 2016-11-19 DIAGNOSIS — Z96651 Presence of right artificial knee joint: Secondary | ICD-10-CM | POA: Diagnosis not present

## 2016-11-19 DIAGNOSIS — M797 Fibromyalgia: Secondary | ICD-10-CM | POA: Diagnosis not present

## 2016-11-19 DIAGNOSIS — M545 Low back pain: Secondary | ICD-10-CM | POA: Insufficient documentation

## 2016-11-19 DIAGNOSIS — M25541 Pain in joints of right hand: Secondary | ICD-10-CM | POA: Insufficient documentation

## 2016-11-19 DIAGNOSIS — J302 Other seasonal allergic rhinitis: Secondary | ICD-10-CM | POA: Insufficient documentation

## 2016-11-19 DIAGNOSIS — M7060 Trochanteric bursitis, unspecified hip: Secondary | ICD-10-CM | POA: Insufficient documentation

## 2016-11-19 DIAGNOSIS — G8929 Other chronic pain: Secondary | ICD-10-CM | POA: Diagnosis not present

## 2016-11-19 DIAGNOSIS — M25542 Pain in joints of left hand: Secondary | ICD-10-CM | POA: Insufficient documentation

## 2016-11-19 DIAGNOSIS — G4700 Insomnia, unspecified: Secondary | ICD-10-CM | POA: Diagnosis not present

## 2016-11-19 DIAGNOSIS — F419 Anxiety disorder, unspecified: Secondary | ICD-10-CM | POA: Insufficient documentation

## 2016-11-19 DIAGNOSIS — M25561 Pain in right knee: Secondary | ICD-10-CM

## 2016-11-19 DIAGNOSIS — G4486 Cervicogenic headache: Secondary | ICD-10-CM

## 2016-11-19 DIAGNOSIS — R51 Headache: Secondary | ICD-10-CM | POA: Diagnosis not present

## 2016-11-19 DIAGNOSIS — M5481 Occipital neuralgia: Secondary | ICD-10-CM | POA: Insufficient documentation

## 2016-11-19 DIAGNOSIS — R52 Pain, unspecified: Secondary | ICD-10-CM | POA: Diagnosis present

## 2016-11-19 DIAGNOSIS — Z79891 Long term (current) use of opiate analgesic: Secondary | ICD-10-CM

## 2016-11-19 DIAGNOSIS — E7800 Pure hypercholesterolemia, unspecified: Secondary | ICD-10-CM | POA: Insufficient documentation

## 2016-11-19 DIAGNOSIS — M25572 Pain in left ankle and joints of left foot: Secondary | ICD-10-CM | POA: Insufficient documentation

## 2016-11-19 DIAGNOSIS — F1190 Opioid use, unspecified, uncomplicated: Secondary | ICD-10-CM | POA: Diagnosis not present

## 2016-11-19 DIAGNOSIS — J45909 Unspecified asthma, uncomplicated: Secondary | ICD-10-CM | POA: Insufficient documentation

## 2016-11-19 DIAGNOSIS — M533 Sacrococcygeal disorders, not elsewhere classified: Secondary | ICD-10-CM | POA: Diagnosis not present

## 2016-11-19 DIAGNOSIS — M25551 Pain in right hip: Secondary | ICD-10-CM | POA: Insufficient documentation

## 2016-11-19 MED ORDER — OXYCODONE HCL 5 MG PO TABS
5.0000 mg | ORAL_TABLET | Freq: Two times a day (BID) | ORAL | 0 refills | Status: DC
Start: 2017-01-22 — End: 2017-02-10

## 2016-11-19 MED ORDER — OXYCODONE HCL 5 MG PO TABS: 5.0000 mg | ORAL_TABLET | Freq: Two times a day (BID) | ORAL | 0 refills | Status: DC

## 2016-11-19 NOTE — Progress Notes (Signed)
Patient's Name: Cynthia Jones  MRN: [id number]  Referring Provider: Guadalupe Maple, MD  DOB: [birth year]  PCP: Guadalupe Maple, MD  DOS: 11/19/2016  Note by: Kathlen Brunswick. Dossie Arbour, MD  Service setting: Ambulatory outpatient  Specialty: Interventional Pain Management  Location: [facility] (AMB) Pain Management Facility    Patient type: Established   Primary Reason(s) for Visit: Encounter for prescription drug management (Level of risk: moderate) CC: Joint Pain ("pain is all over in my joints")  HPI  Cynthia Jones is a 76 y.o. year old, female patient, who comes today for a medication management evaluation. She has Osteopenia; Depression; Essential hypertension; Diabetes mellitus ([place]); Degenerative joint disease of knee; Osteoarthritis; Seasonal allergic rhinitis; Obesity; Intertrigo; Fibromyalgia; Bilateral occipital neuralgia; Greater trochanteric bursitis; Osteoarthritis of knee (Location of Primary Source of Pain) (Left); Sacroiliac joint disease; Lumbar facet syndrome (Bilateral) (L>R); Neuropathy due to secondary diabetes ([place]); S/P TKR Arthroplasty (Right); Folliculitis; Insomnia; Cough; Long term current use of opiate analgesic; Long term prescription opiate use; Opiate use; Long term prescription benzodiazepine use; Chronic pain syndrome; Chronic low back pain (Location of Secondary source of pain) (Bilateral) (L>R); Chronic knee pain (Location of Primary Source of Pain) (Bilateral) (L>R); Cervicogenic headache; Chronic hip pain (Bilateral); Chronic hand pain (Bilateral) (R>L); Chronic foot pain (Left); Hypercholesteremia; and Lumbar spondylosis on her problem list. Her primarily concern today is the Joint Pain ("pain is all over in my joints")  Pain Assessment: Self-Reported Pain Score: 5 /10 Clinically the patient looks like a 2/10 Reported level is inconsistent with clinical observations. Information on the proper use of the pain scale provided to the patient today Pain Type: Chronic  pain Pain Location:  (joint pain) Pain Descriptors / Indicators: Throbbing Pain Frequency: Constant  Cynthia Jones was last scheduled for an appointment on 10/22/2016 for medication management. During today's appointment we reviewed Cynthia Jones's chronic pain status, as well as her outpatient medication regimen. Today we will refill her medications and we will also schedule her for a right sided genicular nerve block and a left sided series of 5 Hyalgan injections for her bilateral knee pain. On the right side we will do the genicular nerve blocks since she has already had a total knee replacement. The patient was informed that this will be a diagnostic injection and we do not expect her to get long-term benefit from this. However, if she does get good short-term benefit, then she may be a candidate for radiofrequency which in turn may provide her with good long-term benefit. The patient was also reminded that this radiofrequencies can be repeated as many times as needed. The diagnostic genicular nerve block will be to determine areas the pain that she is experiencing in the right knee is secondary to the knee itself or if it's coming from her back. Today we reminded the patient of the importance of lowering her weight to a BMI of 30. She indicates having lost 18 pounds and we are very proud of her for doing this, however, she needs to continue until she brings her weight below a BMI of 30. Today she is complaining about generalized pain which is typical of patients that are morbidly obese as herself.  The patient  reports that she does not use drugs. Her body mass index is 43.36 kg/m.  Further details on both, my assessment(s), as well as the proposed treatment plan, please see below.  Controlled Substance Pharmacotherapy Assessment REMS (Risk Evaluation and Mitigation Strategy)  Analgesic: Oxycodone IR 5 mg 1 tablet  by mouth twice a day MME/day:62m/day  DLandis Martins RN  11/19/2016  8:47 AM   Sign at close encounter Nursing Pain Medication Assessment:  Safety precautions to be maintained throughout the outpatient stay will include: orient to surroundings, keep bed in low position, maintain call bell within reach at all times, provide assistance with transfer out of bed and ambulation.  Medication Inspection Compliance: Pill count conducted under aseptic conditions, in front of the patient. Neither the pills nor the bottle was removed from the patient's sight at any time. Once count was completed pills were immediately returned to the patient in their original bottle.  Medication: Oxycodone IR Pill/Patch Count: 7 of 60 pills remain Bottle Appearance: Standard pharmacy container. Clearly labeled. Filled Date: 02/28 / 2018 Last Medication intake:  Yesterday   There is a prescription that was locked in our cabinet written 10-07-16, to be filled on 10-24-16, which patient was to pick up, she did not do so.   Pharmacokinetics: Liberation and absorption (onset of action): WNL Distribution (time to peak effect): WNL Metabolism and excretion (duration of action): WNL         Pharmacodynamics: Desired effects: Analgesia: Ms. JReaderreports >50% benefit. Functional ability: Patient reports that medication allows her to accomplish basic ADLs Clinically meaningful improvement in function (CMIF): Sustained CMIF goals met Perceived effectiveness: Described as relatively effective, allowing for increase in activities of daily living (ADL) Undesirable effects: Side-effects or Adverse reactions: None reported Monitoring: [place] PMP: Online review of the past 132-montheriod conducted. Compliant with practice rules and regulations List of all UDS test(s) done:  Lab Results  Component Value Date   TOXASSSELUR FINAL 02/06/2016   SUMMARY FINAL 09/08/2016   Last UDS on record: ToxAssure Select 13  Date Value Ref Range Status  02/06/2016 FINAL  Final    Comment:     ==================================================================== TOXASSURE SELECT 13 (MW) ==================================================================== Test                             Result       Flag       Units Drug Present and Declared for Prescription Verification   Oxycodone                      650          EXPECTED   ng/mg creat   Noroxycodone                   1312         EXPECTED   ng/mg creat    Sources of oxycodone include scheduled prescription medications.    Noroxycodone is an expected metabolite of oxycodone. ==================================================================== Test                      Result    Flag   Units      Ref Range   Creatinine              98               mg/dL      >=20 ==================================================================== Declared Medications:  The flagging and interpretation on this report are based on the  following declared medications.  Unexpected results may arise from  inaccuracies in the declared medications.  **Note: The testing scope of this panel includes these medications:  Oxycodone (Roxicodone)  **Note: The testing scope of this panel does  not include following  reported medications:  Carvedilol  Ciprofloxacin  Cyproheptadine  Duloxetine  Fluconazole  Losartan (Losartan Potassium)  Methylprednisolone  Nystatin  Omeprazole ==================================================================== For clinical consultation, please call [phone]. ====================================================================    UDS interpretation: Compliant          Medication Assessment Form: Reviewed. Patient indicates being compliant with therapy Treatment compliance: Compliant Risk Assessment Profile: Aberrant behavior: See prior evaluations. None observed or detected today Comorbid factors increasing risk of overdose: See prior notes. No additional risks detected today Risk of substance use disorder  (SUD): Low Opioid Risk Tool (ORT) Total Score: 1  Interpretation Table:  Score <3 = Low Risk for SUD  Score between 4-7 = Moderate Risk for SUD  Score >8 = High Risk for Opioid Abuse   Risk Mitigation Strategies:  Patient Counseling: Covered Patient-Prescriber Agreement (PPA): Present and active  Notification to other healthcare providers: Done  Pharmacologic Plan: No change in therapy, at this time  Laboratory Chemistry  Inflammation Markers Lab Results  Component Value Date   CRP 1.1 (H) 09/24/2016   ESRSEDRATE 44 (H) 09/24/2016   (CRP: Acute Phase) (ESR: Chronic Phase) Renal Function Markers Lab Results  Component Value Date   BUN 12 04/08/2016   CREATININE 0.75 04/08/2016   GFRAA 91 04/08/2016   GFRNONAA 79 04/08/2016   Hepatic Function Markers Lab Results  Component Value Date   AST 15 04/08/2016   ALT 13 04/08/2016   ALBUMIN 3.8 04/08/2016   ALKPHOS 50 04/08/2016   Electrolytes Lab Results  Component Value Date   NA 140 04/08/2016   K 4.2 04/08/2016   CL 101 04/08/2016   CALCIUM 9.3 04/08/2016   MG 2.1 09/24/2016   Neuropathy Markers Lab Results  Component Value Date   VITAMINB12 344 09/24/2016   Bone Pathology Markers Lab Results  Component Value Date   ALKPHOS 50 04/08/2016   25OHVITD1 20 (L) 09/24/2016   25OHVITD2 <1.0 09/24/2016   25OHVITD3 19 09/24/2016   CALCIUM 9.3 04/08/2016   Coagulation Parameters Lab Results  Component Value Date   PLT 314 04/08/2016   Cardiovascular Markers Lab Results  Component Value Date   HCT 39.6 04/08/2016   Note: Lab results reviewed.  Recent Diagnostic Imaging Review  Dg C-arm 1-60 Min-no Report  Result Date: 10/07/2016 Fluoroscopy was utilized by the requesting physician.  No radiographic interpretation.   Note: Imaging results reviewed.          Meds  The patient has a current medication list which includes the following prescription(s): alprazolam, carvedilol, chlorhexidine, duloxetine,  esomeprazole, losartan, oxycodone, oxycodone, and oxycodone.  Current Outpatient Prescriptions on File Prior to Visit  Medication Sig  . ALPRAZolam (XANAX) 0.25 MG tablet Take 1 tablet (0.25 mg total) by mouth 2 (two) times daily as needed for anxiety.  . carvedilol (COREG) 12.5 MG tablet Take 1 tablet (12.5 mg total) by mouth 2 (two) times daily with a meal.  . chlorhexidine (HIBICLENS) 4 % external liquid Apply topically daily as needed.  . DULoxetine (CYMBALTA) 60 MG capsule Take 1 capsule (60 mg total) by mouth daily.  [name] esomeprazole (NEXIUM) 40 MG capsule Take 40 mg by mouth daily at 12 noon.  [name] losartan (COZAAR) 100 MG tablet Take 1 tablet (100 mg total) by mouth daily.   No current facility-administered medications on file prior to visit.    ROS  Constitutional: Denies any fever or chills Gastrointestinal: No reported hemesis, hematochezia, vomiting, or acute GI distress Musculoskeletal: Denies any acute  onset joint swelling, redness, loss of ROM, or weakness Neurological: No reported episodes of acute onset apraxia, aphasia, dysarthria, agnosia, amnesia, paralysis, loss of coordination, or loss of consciousness  Allergies  Ms. Toole is allergic to nsaids and erythromycin.  [place]  Drug: Ms. Tafolla  reports that she does not use drugs. Alcohol:  reports that she does not drink alcohol. Tobacco:  reports that she has never smoked. She has never used smokeless tobacco. Medical:  has a past medical history of Acute anxiety (03/06/2015); Allergy; Anxiety; Degenerative joint disease of knee; Depression; Fibromyalgia; Obesity; Osteopenia; and RAD (reactive airway disease). Family: family history includes Alzheimer's disease in her father; Cancer in her sister; Diabetes in her mother; Heart disease in her mother; Parkinson's disease in her father; Stroke in her mother.  Past Surgical History:  Procedure Laterality Date  . ABDOMINAL HYSTERECTOMY    . APPENDECTOMY    .  CHOLECYSTECTOMY    . EYE SURGERY    . JOINT REPLACEMENT  2014   right knee  . PAROTIDECTOMY Left   . TONSILLECTOMY     Constitutional Exam  General appearance: Well nourished, well developed, and well hydrated. In no apparent acute distress Vitals:   11/19/16 0839  BP: 139/62  Pulse: 64  Resp: 18  Temp: 98.4 F (36.9 C)  TempSrc: Oral  SpO2: 99%  Weight: 222 lb (100.7 kg)  Height: 5' (1.524 m)   BMI Assessment: Estimated body mass index is 43.36 kg/m as calculated from the following:   Height as of this encounter: 5' (1.524 m).   Weight as of this encounter: 222 lb (100.7 kg).  BMI interpretation table: BMI level Category Range association with higher incidence of chronic pain  <18 kg/m2 Underweight   18.5-24.9 kg/m2 Ideal body weight   25-29.9 kg/m2 Overweight Increased incidence by 20%  30-34.9 kg/m2 Obese (Class I) Increased incidence by 68%  35-39.9 kg/m2 Severe obesity (Class II) Increased incidence by 136%  >40 kg/m2 Extreme obesity (Class III) Increased incidence by 254%   BMI Readings from Last 4 Encounters:  11/19/16 43.36 kg/m  11/02/16 43.16 kg/m  10/22/16 43.36 kg/m  10/07/16 43.36 kg/m   Wt Readings from Last 4 Encounters:  11/19/16 222 lb (100.7 kg)  11/02/16 221 lb (100.2 kg)  10/22/16 222 lb (100.7 kg)  10/07/16 222 lb (100.7 kg)  Psych/Mental status: Alert, oriented x 3 (person, place, & time)       Eyes: PERLA Respiratory: No evidence of acute respiratory distress  Cervical Spine Exam  Inspection: No masses, redness, or swelling Alignment: Symmetrical Functional ROM: Unrestricted ROM Stability: No instability detected Muscle strength & Tone: Functionally intact Sensory: Unimpaired Palpation: No palpable anomalies  Upper Extremity (UE) Exam    Side: Right upper extremity  Side: Left upper extremity  Inspection: No masses, redness, swelling, or asymmetry. No contractures  Inspection: No masses, redness, swelling, or asymmetry. No  contractures  Functional ROM: Unrestricted ROM          Functional ROM: Unrestricted ROM          Muscle strength & Tone: Functionally intact  Muscle strength & Tone: Functionally intact  Sensory: Unimpaired  Sensory: Unimpaired  Palpation: No palpable anomalies  Palpation: No palpable anomalies  Specialized Test(s): Deferred         Specialized Test(s): Deferred          Thoracic Spine Exam  Inspection: No masses, redness, or swelling Alignment: Symmetrical Functional ROM: Unrestricted ROM Stability: No instability detected Sensory:  Unimpaired Muscle strength & Tone: No palpable anomalies  Lumbar Spine Exam  Inspection: No masses, redness, or swelling Alignment: Symmetrical Functional ROM: Unrestricted ROM Stability: No instability detected Muscle strength & Tone: Functionally intact Sensory: Unimpaired Palpation: No palpable anomalies Provocative Tests: Lumbar Hyperextension and rotation test: evaluation deferred today       Patrick's Maneuver: evaluation deferred today              Gait & Posture Assessment  Ambulation: Unassisted Gait: Relatively normal for age and body habitus Posture: WNL   Lower Extremity Exam    Side: Right lower extremity  Side: Left lower extremity  Inspection: No masses, redness, swelling, or asymmetry. No contractures  Inspection: No masses, redness, swelling, or asymmetry. No contractures  Functional ROM: Unrestricted ROM          Functional ROM: Unrestricted ROM          Muscle strength & Tone: Functionally intact  Muscle strength & Tone: Functionally intact  Sensory: Unimpaired  Sensory: Unimpaired  Palpation: No palpable anomalies  Palpation: No palpable anomalies   Assessment  Primary Diagnosis & Pertinent Problem List: The primary encounter diagnosis was Chronic pain syndrome. Diagnoses of Chronic knee pain (Location of Primary Source of Pain) (Bilateral) (L>R), Chronic low back pain (Location of Secondary source of pain) (Bilateral) (L>R),  Osteoarthritis of knee (Location of Primary Source of Pain) (Left), Long term prescription opiate use, Opiate use, and Cervicogenic headache were also pertinent to this visit.  Status Diagnosis  Controlled Controlled Controlled 1. Chronic pain syndrome   2. Chronic knee pain (Location of Primary Source of Pain) (Bilateral) (L>R)   3. Chronic low back pain (Location of Secondary source of pain) (Bilateral) (L>R)   4. Osteoarthritis of knee (Location of Primary Source of Pain) (Left)   5. Long term prescription opiate use   6. Opiate use   7. Cervicogenic headache      Plan of Care  Pharmacotherapy (Medications Ordered): Meds ordered this encounter  Medications  . oxyCODONE (OXY IR/ROXICODONE) 5 MG immediate release tablet    Sig: Take 1 tablet (5 mg total) by mouth 2 (two) times daily.    Dispense:  60 tablet    Refill:  0    Fill one day early if pharmacy is closed on scheduled refill date. Do not fill until: 11/23/16 To last until: 12/23/16  . oxyCODONE (OXY IR/ROXICODONE) 5 MG immediate release tablet    Sig: Take 1 tablet (5 mg total) by mouth 2 (two) times daily.    Dispense:  60 tablet    Refill:  0    Fill one day early if pharmacy is closed on scheduled refill date. Do not fill until: 12/23/16 To last until: 01/22/17  . oxyCODONE (OXY IR/ROXICODONE) 5 MG immediate release tablet    Sig: Take 1 tablet (5 mg total) by mouth 2 (two) times daily.    Dispense:  60 tablet    Refill:  0    Fill one day early if pharmacy is closed on scheduled refill date. Do not fill until: 01/22/17 To last until: 02/21/17   New Prescriptions   No medications on file   Medications administered today: Ms. Dempsey had no medications administered during this visit. Lab-work, procedure(s), and/or referral(s): Orders Placed This Encounter  Procedures  . GENICULAR NERVE BLOCK  . KNEE INJECTION   Imaging and/or referral(s): None  Interventional therapies: Planned, scheduled, and/or  pending:   Diagnostic right genicular nerve block Therapeutic left intra-articular  hyalgan knee injection   Considering:   Diagnostic right Genicular nerve block  Possible right Genicular nerve radiofrequency ablation  Diagnostic left intra-articular knee joint injection  Possible series of 5 left intra-articular Hyalgan knee injections  Diagnostic left Genicular nerve block  Possible left Genicular nerve RFA  Diagnostic bilateral lumbar facet block  Possible bilateral lumbar facet RFA  Diagnostic left lumbar epidural steroid injection  Diagnostic caudal epidural steroid injection + epidurogram  Possible Racz procedure  Diagnostic bilateral cervical facet block  Possible bilateral cervical facet RFA  Diagnostic right versus left cervical epidural steroid injection  Diagnostic bilateral intra-articular shoulder joint injection  Diagnostic bilateral suprascapular nerve block  Possible bilateral suprascapular nerve RFA    Palliative PRN treatment(s):   Diagnostic bilateral Genicular nerve block  Diagnostic left intra-articular knee joint injection  Diagnostic bilateral lumbar facet block  Diagnostic left lumbar epidural steroid injection  Diagnostic caudal epidural steroid injection + epidurogram  Diagnostic bilateral cervical facet block  Diagnostic right versus left cervical epidural steroid injection  Diagnostic bilateral intra-articular shoulder joint injection  Diagnostic bilateral suprascapular nerve block    Provider-requested follow-up: Return in about 3 months (around 02/08/2017) for (Nurse Practitioner) Med-Mgmt, in addition, procedure (ASAA).  Future Appointments Date Time Provider [place]  12/02/2016 3:45 PM Guadalupe Maple, MD [facility] None  02/08/2017 8:40 AM [place], NP [facility] None   Primary Care Physician: Guadalupe Maple, MD Location: [facility] Outpatient [facility] Management [facility] Note by: Kathlen Brunswick Dossie Arbour, M.D, DABA, DABAPM, DABPM, DABIPP,  FIPP Date: 11/19/2016; Time: 9:46 AM  Pain Score Disclaimer: We use the NRS-11 scale. This is a self-reported, subjective measurement of pain severity with only modest accuracy. It is used primarily to identify changes within a particular patient. It must be understood that outpatient pain scales are significantly less accurate that those used for research, where they can be applied under ideal controlled circumstances with minimal exposure to variables. In reality, the score is likely to be a combination of pain intensity and pain affect, where pain affect describes the degree of emotional arousal or changes in action readiness caused by the sensory experience of pain. Factors such as social and work situation, setting, emotional state, anxiety levels, expectation, and prior pain experience may influence pain perception and show large inter-individual differences that may also be affected by time variables.  Patient instructions provided during this appointment: Patient Instructions   Pain Score  Introduction: The pain score used by this practice is the Verbal Numerical Rating Scale (VNRS-11). This is an 11-point scale. It is for adults and children 10 years or older. There are significant differences in how the pain score is reported, used, and applied. Forget everything you learned in the past and learn this scoring system.  General Information: The scale should reflect your current level of pain. Unless you are specifically asked for the level of your worst pain, or your average pain. If you are asked for one of these two, then it should be understood that it is over the past 24 hours.  Basic Activities of Daily Living (ADL): Personal hygiene, dressing, eating, transferring, and using restroom.  Instructions: Most patients tend to report their level of pain as a combination of two factors, their physical pain and their psychosocial pain. This last one is also known as "suffering" and it is  reflection of how physical pain affects you socially and psychologically. From now on, report them separately. From this point on, when asked to report your  pain level, report only your physical pain. Use the following table for reference.  Pain Clinic Pain Levels (0-5/10)  Pain Level Score Description  No Pain 0   Mild pain 1 Nagging, annoying, but does not interfere with basic activities of daily living (ADL). Patients are able to eat, bathe, get dressed, toileting (being able to get on and off the toilet and perform personal hygiene functions), transfer (move in and out of bed or a chair without assistance), and maintain continence (able to control bladder and bowel functions). Blood pressure and heart rate are unaffected. A normal heart rate for a healthy adult ranges from 60 to 100 bpm (beats per minute).   Mild to moderate pain 2 Noticeable and distracting. Impossible to hide from other people. More frequent flare-ups. Still possible to adapt and function close to normal. It can be very annoying and may have occasional stronger flare-ups. With discipline, patients may get used to it and adapt.   Moderate pain 3 Interferes significantly with activities of daily living (ADL). It becomes difficult to feed, bathe, get dressed, get on and off the toilet or to perform personal hygiene functions. Difficult to get in and out of bed or a chair without assistance. Very distracting. With effort, it can be ignored when deeply involved in activities.   Moderately severe pain 4 Impossible to ignore for more than a few minutes. With effort, patients may still be able to manage work or participate in some social activities. Very difficult to concentrate. Signs of autonomic nervous system discharge are evident: dilated pupils (mydriasis); mild sweating (diaphoresis); sleep interference. Heart rate becomes elevated (>115 bpm). Diastolic blood pressure (lower number) rises above 100 mmHg. Patients find relief in  laying down and not moving.   Severe pain 5 Intense and extremely unpleasant. Associated with frowning face and frequent crying. Pain overwhelms the senses.  Ability to do any activity or maintain social relationships becomes significantly limited. Conversation becomes difficult. Pacing back and forth is common, as getting into a comfortable position is nearly impossible. Pain wakes you up from deep sleep. Physical signs will be obvious: pupillary dilation; increased sweating; goosebumps; brisk reflexes; cold, clammy hands and feet; nausea, vomiting or dry heaves; loss of appetite; significant sleep disturbance with inability to fall asleep or to remain asleep. When persistent, significant weight loss is observed due to the complete loss of appetite and sleep deprivation.  Blood pressure and heart rate becomes significantly elevated. Caution: If elevated blood pressure triggers a pounding headache associated with blurred vision, then the patient should immediately seek attention at an urgent or emergency care unit, as these may be signs of an impending stroke.    Emergency Department Pain Levels (6-10/10)  Emergency Room Pain 6 Severely limiting. Requires emergency care and should not be seen or managed at an outpatient pain management facility. Communication becomes difficult and requires great effort. Assistance to reach the emergency department may be required. Facial flushing and profuse sweating along with potentially dangerous increases in heart rate and blood pressure will be evident.   Distressing pain 7 Self-care is very difficult. Assistance is required to transport, or use restroom. Assistance to reach the emergency department will be required. Tasks requiring coordination, such as bathing and getting dressed become very difficult.   Disabling pain 8 Self-care is no longer possible. At this level, pain is disabling. The individual is unable to do even the most "basic" activities such as  walking, eating, bathing, dressing, transferring to a bed, or toileting.  Fine motor skills are lost. It is difficult to think clearly.   Incapacitating pain 9 Pain becomes incapacitating. Thought processing is no longer possible. Difficult to remember your own name. Control of movement and coordination are lost.   The worst pain imaginable 10 At this level, most patients pass out from pain. When this level is reached, collapse of the autonomic nervous system occurs, leading to a sudden drop in blood pressure and heart rate. This in turn results in a temporary and dramatic drop in blood flow to the brain, leading to a loss of consciousness. Fainting is one of the body's self defense mechanisms. Passing out puts the brain in a calmed state and causes it to shut down for a while, in order to begin the healing process.    Summary: 1. Refer to this scale when providing [place] with your pain level. 2. Be accurate and careful when reporting your pain level. This will help with your care. 3. Over-reporting your pain level will lead to loss of credibility. 4. Even a level of 1/10 means that there is pain and will be treated at our facility. 5. High, inaccurate reporting will be documented as "Symptom Exaggeration", leading to loss of credibility and suspicions of possible secondary gains such as obtaining more narcotics, or wanting to appear disabled, for fraudulent reasons. 6. Only pain levels of 5 or below will be seen at our facility. 7. Pain levels of 6 and above will be sent to the Emergency Department and the appointment cancelled. _____________________________________________________________________________________________  Preparing for Procedure with Sedation Instructions: . Oral Intake: Do not eat or drink anything for at least 8 hours prior to your procedure. . Transportation: Public transportation is not allowed. Bring an adult driver. The driver must be physically present in our waiting room  before any procedure can be started. [name] Physical Assistance: Bring an adult physically capable of assisting you, in the event you need help. This adult should keep you company at home for at least 6 hours after the procedure. . Blood Pressure Medicine: Take your blood pressure medicine with a sip of water the morning of the procedure. . Blood thinners:  . Diabetics on insulin: Notify the staff so that you can be scheduled 1st case in the morning. If your diabetes requires high dose insulin, take only  of your normal insulin dose the morning of the procedure and notify the staff that you have done so. . Preventing infections: Shower with an antibacterial soap the morning of your procedure. . Build-up your immune system: Take 1000 mg of Vitamin C with every meal (3 times a day) the day prior to your procedure. [name] Antibiotics: Inform the staff if you have a condition or reason that requires you to take antibiotics before dental procedures. . Pregnancy: If you are pregnant, call and cancel the procedure. . Sickness: If you have a cold, fever, or any active infections, call and cancel the procedure. . Arrival: You must be in the facility at least 30 minutes prior to your scheduled procedure. . Children: Do not bring children with you. . Dress appropriately: Bring dark clothing that you would not mind if they get stained. . Valuables: Do not bring any jewelry or valuables. Procedure appointments are reserved for interventional treatments only. [name] No Prescription Refills. . No medication changes will be discussed during procedure appointments. . No disability issues will be discussed.  ____________________________________________________________________________________________   Knee Injection A knee injection is a procedure to get medicine into your knee joint.  Your health care provider puts a needle into the joint and injects medicine with an attached syringe. The injected medicine may relieve the  pain, swelling, and stiffness of arthritis. The injected medicine may also help to lubricate and cushion your knee joint. You may need more than one injection. Tell a health care provider about:  Any allergies you have.  All medicines you are taking, including vitamins, herbs, eye drops, creams, and over-the-counter medicines.  Any problems you or family members have had with anesthetic medicines.  Any blood disorders you have.  Any surgeries you have had.  Any medical conditions you have. What are the risks? Generally, this is a safe procedure. However, problems may occur, including:  Infection.  Bleeding.  Worsening symptoms.  Damage to the area around your knee.  Allergic reaction to any of the medicines.  Skin reactions from repeated injections. What happens before the procedure?  Ask your health care provider about changing or stopping your regular medicines. This is especially important if you are taking diabetes medicines or blood thinners.  Plan to have someone take you home after the procedure. What happens during the procedure?  You will sit or lie down in a position for your knee to be treated.  The skin over your kneecap will be cleaned with a germ-killing solution (antiseptic).  You will be given a medicine that numbs the area (local anesthetic). You may feel some stinging.  After your knee becomes numb, you will have a second injection. This is the medicine. This needle is carefully placed between your kneecap and your knee. The medicine is injected into the joint space.  At the end of the procedure, the needle will be removed.  A bandage (dressing) may be placed over the injection site. The procedure may vary among health care providers and hospitals. What happens after the procedure?  You may have to move your knee through its full range of motion. This helps to get all of the medicine into your joint space.  Your blood pressure, heart rate,  breathing rate, and blood oxygen level will be monitored often until the medicines you were given have worn off.  You will be watched to make sure that you do not have a reaction to the injected medicine. This information is not intended to replace advice given to you by your health care provider. Make sure you discuss any questions you have with your health care provider. Document Released: 11/08/2006 Document Revised: 01/17/2016 Document Reviewed: 06/27/2014 Elsevier Interactive Patient Education  2017 [place]  What are the risk, side effects and possible complications? Generally speaking, most procedures are safe.  However, with any procedure there are risks, side effects, and the possibility of complications.  The risks and complications are dependent upon the sites that are lesioned, or the type of nerve block to be performed.  The closer the procedure is to the spine, the more serious the risks are.  Great care is taken when placing the radio frequency needles, block needles or lesioning probes, but sometimes complications can occur. 1. Infection: Any time there is an injection through the skin, there is a risk of infection.  This is why sterile conditions are used for these blocks.  There are four possible types of infection. 1. Localized skin infection. 2. Central Nervous System Infection-This can be in the form of Meningitis, which can be deadly. 3. Epidural Infections-This can be in the form of an epidural abscess, which can cause pressure  inside of the spine, causing compression of the spinal cord with subsequent paralysis. This would require an emergency surgery to decompress, and there are no guarantees that the patient would recover from the paralysis. 4. Discitis-This is an infection of the intervertebral discs.  It occurs in about 1% of discography procedures.  It is difficult to treat and it may lead to surgery.        2. Pain: the needles have  to go through skin and soft tissues, will cause soreness.       3. Damage to internal structures:  The nerves to be lesioned may be near blood vessels or    other nerves which can be potentially damaged.       4. Bleeding: Bleeding is more common if the patient is taking blood thinners such as  aspirin, Coumadin, Ticiid, Plavix, etc., or if he/she have some genetic predisposition  such as hemophilia. Bleeding into the spinal canal can cause compression of the spinal  cord with subsequent paralysis.  This would require an emergency surgery to  decompress and there are no guarantees that the patient would recover from the  paralysis.       5. Pneumothorax:  Puncturing of a lung is a possibility, every time a needle is introduced in  the area of the chest or upper back.  Pneumothorax refers to free air around the  collapsed lung(s), inside of the thoracic cavity (chest cavity).  Another two possible  complications related to a similar event would include: Hemothorax and Chylothorax.   These are variations of the Pneumothorax, where instead of air around the collapsed  lung(s), you may have blood or chyle, respectively.       6. Spinal headaches: They may occur with any procedures in the area of the spine.       7. Persistent CSF (Cerebro-Spinal Fluid) leakage: This is a rare problem, but may occur  with prolonged intrathecal or epidural catheters either due to the formation of a fistulous  track or a dural tear.       8. Nerve damage: By working so close to the spinal cord, there is always a possibility of  nerve damage, which could be as serious as a permanent spinal cord injury with  paralysis.       9. Death:  Although rare, severe deadly allergic reactions known as "Anaphylactic  reaction" can occur to any of the medications used.      10. Worsening of the symptoms:  We can always make thing worse.  What are the chances of something like this happening? Chances of any of this occuring are extremely low.   By statistics, you have more of a chance of getting killed in a motor vehicle accident: while driving to the hospital than any of the above occurring .  Nevertheless, you should be aware that they are possibilities.  In general, it is similar to taking a shower.  Everybody knows that you can slip, hit your head and get killed.  Does that mean that you should not shower again?  Nevertheless always keep in mind that statistics do not mean anything if you happen to be on the wrong side of them.  Even if a procedure has a 1 (one) in a 1,000,000 (million) chance of going wrong, it you happen to be that one..Also, keep in mind that by statistics, you have more of a chance of having something go wrong when taking medications.  Who should not have this  procedure? If you are on a blood thinning medication (e.g. Coumadin, Plavix, see list of "Blood Thinners"), or if you have an active infection going on, you should not have the procedure.  If you are taking any blood thinners, please inform your physician.  How should I prepare for this procedure?  Do not eat or drink anything at least six hours prior to the procedure.  Bring a driver with you .  It cannot be a taxi.  Come accompanied by an adult that can drive you back, and that is strong enough to help you if your legs get weak or numb from the local anesthetic.  Take all of your medicines the morning of the procedure with just enough water to swallow them.  If you have diabetes, make sure that you are scheduled to have your procedure done first thing in the morning, whenever possible.  If you have diabetes, take only half of your insulin dose and notify our nurse that you have done so as soon as you arrive at the clinic.  If you are diabetic, but only take blood sugar pills (oral hypoglycemic), then do not take them on the morning of your procedure.  You may take them after you have had the procedure.  Do not take aspirin or any aspirin-containing  medications, at least eleven (11) days prior to the procedure.  They may prolong bleeding.  Wear loose fitting clothing that may be easy to take off and that you would not mind if it got stained with Betadine or blood.  Do not wear any jewelry or perfume  Remove any nail coloring.  It will interfere with some of our monitoring equipment.  NOTE: Remember that this is not meant to be interpreted as a complete list of all possible complications.  Unforeseen problems may occur.  BLOOD THINNERS The following drugs contain aspirin or other products, which can cause increased bleeding during surgery and should not be taken for 2 weeks prior to and 1 week after surgery.  If you should need take something for relief of minor pain, you may take acetaminophen which is found in Tylenol,m Datril, Anacin-3 and Panadol. It is not blood thinner. The products listed below are.  Do not take any of the products listed below in addition to any listed on your instruction sheet.  A.P.C or A.P.C with Codeine Codeine Phosphate Capsules #3 Ibuprofen Ridaura  ABC compound Congesprin Imuran rimadil  Advil Cope Indocin Robaxisal  Alka-Seltzer Effervescent Pain Reliever and Antacid Coricidin or Coricidin-D  Indomethacin Rufen  Alka-Seltzer plus Cold Medicine Cosprin Ketoprofen S-A-C Tablets  Anacin Analgesic Tablets or Capsules Coumadin Korlgesic Salflex  Anacin Extra Strength Analgesic tablets or capsules CP-2 Tablets Lanoril Salicylate  Anaprox Cuprimine Capsules Levenox Salocol  Anexsia-D Dalteparin Magan Salsalate  Anodynos Darvon compound Magnesium Salicylate Sine-off  Ansaid Dasin Capsules Magsal Sodium Salicylate  Anturane Depen Capsules Marnal Soma  APF Arthritis pain formula Dewitt's Pills Measurin Stanback  Argesic Dia-Gesic Meclofenamic Sulfinpyrazone  Arthritis Bayer Timed Release Aspirin Diclofenac Meclomen Sulindac  Arthritis pain formula Anacin Dicumarol Medipren Supac  Analgesic (Safety coated)  Arthralgen Diffunasal Mefanamic Suprofen  Arthritis Strength Bufferin Dihydrocodeine Mepro Compound Suprol  Arthropan liquid Dopirydamole Methcarbomol with Aspirin Synalgos  ASA tablets/Enseals Disalcid Micrainin Tagament  Ascriptin Doan's Midol Talwin  Ascriptin A/D Dolene Mobidin Tanderil  Ascriptin Extra Strength Dolobid Moblgesic Ticlid  Ascriptin with Codeine Doloprin or Doloprin with Codeine Momentum Tolectin  Asperbuf Duoprin Mono-gesic Trendar  Aspergum Duradyne Motrin or Motrin IB Triminicin  Aspirin plain, buffered or enteric coated Durasal Myochrisine Trigesic  Aspirin Suppositories Easprin Nalfon Trillsate  Aspirin with Codeine Ecotrin Regular or Extra Strength Naprosyn Uracel  Atromid-S Efficin Naproxen Ursinus  Auranofin Capsules Elmiron Neocylate Vanquish  Axotal Emagrin Norgesic Verin  Azathioprine Empirin or Empirin with Codeine Normiflo Vitamin E  Azolid Emprazil Nuprin Voltaren  Bayer Aspirin plain, buffered or children's or timed BC Tablets or powders Encaprin Orgaran Warfarin Sodium  Buff-a-Comp Enoxaparin Orudis Zorpin  Buff-a-Comp with Codeine Equegesic Os-Cal-Gesic   Buffaprin Excedrin plain, buffered or Extra Strength Oxalid   Bufferin Arthritis Strength Feldene Oxphenbutazone   Bufferin plain or Extra Strength Feldene Capsules Oxycodone with Aspirin   Bufferin with Codeine Fenoprofen Fenoprofen Pabalate or Pabalate-SF   Buffets II Flogesic Panagesic   Buffinol plain or Extra Strength Florinal or Florinal with Codeine Panwarfarin   Buf-Tabs Flurbiprofen Penicillamine   Butalbital Compound Four-way cold tablets Penicillin   Butazolidin Fragmin Pepto-Bismol   Carbenicillin Geminisyn Percodan   Carna Arthritis Reliever Geopen Persantine   Carprofen Gold's salt Persistin   Chloramphenicol Goody's Phenylbutazone   Chloromycetin Haltrain Piroxlcam   Clmetidine heparin Plaquenil   Cllnoril Hyco-pap Ponstel   Clofibrate Hydroxy chloroquine Propoxyphen          Before stopping any of these medications, be sure to consult the physician who ordered them.  Some, such as Coumadin (Warfarin) are ordered to prevent or treat serious conditions such as "deep thrombosis", "pumonary embolisms", and other heart problems.  The amount of time that you may need off of the medication may also vary with the medication and the reason for which you were taking it.  If you are taking any of these medications, please make sure you notify your pain physician before you undergo any procedures.

## 2016-11-19 NOTE — Patient Instructions (Addendum)
Pain Score  Introduction: The pain score used by this practice is the Verbal Numerical Rating Scale (VNRS-11). This is an 11-point scale. It is for adults and children 10 years or older. There are significant differences in how the pain score is reported, used, and applied. Forget everything you learned in the past and learn this scoring system.  General Information: The scale should reflect your current level of pain. Unless you are specifically asked for the level of your worst pain, or your average pain. If you are asked for one of these two, then it should be understood that it is over the past 24 hours.  Basic Activities of Daily Living (ADL): Personal hygiene, dressing, eating, transferring, and using restroom.  Instructions: Most patients tend to report their level of pain as a combination of two factors, their physical pain and their psychosocial pain. This last one is also known as "suffering" and it is reflection of how physical pain affects you socially and psychologically. From now on, report them separately. From this point on, when asked to report your pain level, report only your physical pain. Use the following table for reference.  Pain Clinic Pain Levels (0-5/10)  Pain Level Score Description  No Pain 0   Mild pain 1 Nagging, annoying, but does not interfere with basic activities of daily living (ADL). Patients are able to eat, bathe, get dressed, toileting (being able to get on and off the toilet and perform personal hygiene functions), transfer (move in and out of bed or a chair without assistance), and maintain continence (able to control bladder and bowel functions). Blood pressure and heart rate are unaffected. A normal heart rate for a healthy adult ranges from 60 to 100 bpm (beats per minute).   Mild to moderate pain 2 Noticeable and distracting. Impossible to hide from other people. More frequent flare-ups. Still possible to adapt and function close to normal. It can be very  annoying and may have occasional stronger flare-ups. With discipline, patients may get used to it and adapt.   Moderate pain 3 Interferes significantly with activities of daily living (ADL). It becomes difficult to feed, bathe, get dressed, get on and off the toilet or to perform personal hygiene functions. Difficult to get in and out of bed or a chair without assistance. Very distracting. With effort, it can be ignored when deeply involved in activities.   Moderately severe pain 4 Impossible to ignore for more than a few minutes. With effort, patients may still be able to manage work or participate in some social activities. Very difficult to concentrate. Signs of autonomic nervous system discharge are evident: dilated pupils (mydriasis); mild sweating (diaphoresis); sleep interference. Heart rate becomes elevated (>115 bpm). Diastolic blood pressure (lower number) rises above 100 mmHg. Patients find relief in laying down and not moving.   Severe pain 5 Intense and extremely unpleasant. Associated with frowning face and frequent crying. Pain overwhelms the senses.  Ability to do any activity or maintain social relationships becomes significantly limited. Conversation becomes difficult. Pacing back and forth is common, as getting into a comfortable position is nearly impossible. Pain wakes you up from deep sleep. Physical signs will be obvious: pupillary dilation; increased sweating; goosebumps; brisk reflexes; cold, clammy hands and feet; nausea, vomiting or dry heaves; loss of appetite; significant sleep disturbance with inability to fall asleep or to remain asleep. When persistent, significant weight loss is observed due to the complete loss of appetite and sleep deprivation.  Blood pressure and heart   rate becomes significantly elevated. Caution: If elevated blood pressure triggers a pounding headache associated with blurred vision, then the patient should immediately seek attention at an urgent or  emergency care unit, as these may be signs of an impending stroke.    Emergency Department Pain Levels (6-10/10)  Emergency Room Pain 6 Severely limiting. Requires emergency care and should not be seen or managed at an outpatient pain management facility. Communication becomes difficult and requires great effort. Assistance to reach the emergency department may be required. Facial flushing and profuse sweating along with potentially dangerous increases in heart rate and blood pressure will be evident.   Distressing pain 7 Self-care is very difficult. Assistance is required to transport, or use restroom. Assistance to reach the emergency department will be required. Tasks requiring coordination, such as bathing and getting dressed become very difficult.   Disabling pain 8 Self-care is no longer possible. At this level, pain is disabling. The individual is unable to do even the most "basic" activities such as walking, eating, bathing, dressing, transferring to a bed, or toileting. Fine motor skills are lost. It is difficult to think clearly.   Incapacitating pain 9 Pain becomes incapacitating. Thought processing is no longer possible. Difficult to remember your own name. Control of movement and coordination are lost.   The worst pain imaginable 10 At this level, most patients pass out from pain. When this level is reached, collapse of the autonomic nervous system occurs, leading to a sudden drop in blood pressure and heart rate. This in turn results in a temporary and dramatic drop in blood flow to the brain, leading to a loss of consciousness. Fainting is one of the body's self defense mechanisms. Passing out puts the brain in a calmed state and causes it to shut down for a while, in order to begin the healing process.    Summary: 1. Refer to this scale when providing [place] with your pain level. 2. Be accurate and careful when reporting your pain level. This will help with your care. 3. Over-reporting  your pain level will lead to loss of credibility. 4. Even a level of 1/10 means that there is pain and will be treated at our facility. 5. High, inaccurate reporting will be documented as "Symptom Exaggeration", leading to loss of credibility and suspicions of possible secondary gains such as obtaining more narcotics, or wanting to appear disabled, for fraudulent reasons. 6. Only pain levels of 5 or below will be seen at our facility. 7. Pain levels of 6 and above will be sent to the Emergency Department and the appointment cancelled. _____________________________________________________________________________________________  Preparing for Procedure with Sedation Instructions: . Oral Intake: Do not eat or drink anything for at least 8 hours prior to your procedure. . Transportation: Public transportation is not allowed. Bring an adult driver. The driver must be physically present in our waiting room before any procedure can be started. [name] Physical Assistance: Bring an adult physically capable of assisting you, in the event you need help. This adult should keep you company at home for at least 6 hours after the procedure. . Blood Pressure Medicine: Take your blood pressure medicine with a sip of water the morning of the procedure. . Blood thinners:  . Diabetics on insulin: Notify the staff so that you can be scheduled 1st case in the morning. If your diabetes requires high dose insulin, take only  of your normal insulin dose the morning of the procedure and notify the staff that you have done  so. . Preventing infections: Shower with an antibacterial soap the morning of your procedure. . Build-up your immune system: Take 1000 mg of Vitamin C with every meal (3 times a day) the day prior to your procedure. [name] Antibiotics: Inform the staff if you have a condition or reason that requires you to take antibiotics before dental procedures. . Pregnancy: If you are pregnant, call and cancel the  procedure. . Sickness: If you have a cold, fever, or any active infections, call and cancel the procedure. . Arrival: You must be in the facility at least 30 minutes prior to your scheduled procedure. . Children: Do not bring children with you. . Dress appropriately: Bring dark clothing that you would not mind if they get stained. . Valuables: Do not bring any jewelry or valuables. Procedure appointments are reserved for interventional treatments only. [name] No Prescription Refills. . No medication changes will be discussed during procedure appointments. . No disability issues will be discussed.  ____________________________________________________________________________________________   Knee Injection A knee injection is a procedure to get medicine into your knee joint. Your health care provider puts a needle into the joint and injects medicine with an attached syringe. The injected medicine may relieve the pain, swelling, and stiffness of arthritis. The injected medicine may also help to lubricate and cushion your knee joint. You may need more than one injection. Tell a health care provider about:  Any allergies you have.  All medicines you are taking, including vitamins, herbs, eye drops, creams, and over-the-counter medicines.  Any problems you or family members have had with anesthetic medicines.  Any blood disorders you have.  Any surgeries you have had.  Any medical conditions you have. What are the risks? Generally, this is a safe procedure. However, problems may occur, including:  Infection.  Bleeding.  Worsening symptoms.  Damage to the area around your knee.  Allergic reaction to any of the medicines.  Skin reactions from repeated injections. What happens before the procedure?  Ask your health care provider about changing or stopping your regular medicines. This is especially important if you are taking diabetes medicines or blood thinners.  Plan to have  someone take you home after the procedure. What happens during the procedure?  You will sit or lie down in a position for your knee to be treated.  The skin over your kneecap will be cleaned with a germ-killing solution (antiseptic).  You will be given a medicine that numbs the area (local anesthetic). You may feel some stinging.  After your knee becomes numb, you will have a second injection. This is the medicine. This needle is carefully placed between your kneecap and your knee. The medicine is injected into the joint space.  At the end of the procedure, the needle will be removed.  A bandage (dressing) may be placed over the injection site. The procedure may vary among health care providers and hospitals. What happens after the procedure?  You may have to move your knee through its full range of motion. This helps to get all of the medicine into your joint space.  Your blood pressure, heart rate, breathing rate, and blood oxygen level will be monitored often until the medicines you were given have worn off.  You will be watched to make sure that you do not have a reaction to the injected medicine. This information is not intended to replace advice given to you by your health care provider. Make sure you discuss any questions you have with your health  care provider. Document Released: 11/08/2006 Document Revised: 01/17/2016 Document Reviewed: 06/27/2014 Elsevier Interactive Patient Education  2017 [employer] GENERAL RISKS AND COMPLICATIONS  What are the risk, side effects and possible complications? Generally speaking, most procedures are safe.  However, with any procedure there are risks, side effects, and the possibility of complications.  The risks and complications are dependent upon the sites that are lesioned, or the type of nerve block to be performed.  The closer the procedure is to the spine, the more serious the risks are.  Great care is taken when placing the radio  frequency needles, block needles or lesioning probes, but sometimes complications can occur. 1. Infection: Any time there is an injection through the skin, there is a risk of infection.  This is why sterile conditions are used for these blocks.  There are four possible types of infection. 1. Localized skin infection. 2. Central Nervous System Infection-This can be in the form of Meningitis, which can be deadly. 3. Epidural Infections-This can be in the form of an epidural abscess, which can cause pressure inside of the spine, causing compression of the spinal cord with subsequent paralysis. This would require an emergency surgery to decompress, and there are no guarantees that the patient would recover from the paralysis. 4. Discitis-This is an infection of the intervertebral discs.  It occurs in about 1% of discography procedures.  It is difficult to treat and it may lead to surgery.        2. Pain: the needles have to go through skin and soft tissues, will cause soreness.       3. Damage to internal structures:  The nerves to be lesioned may be near blood vessels or    other nerves which can be potentially damaged.       4. Bleeding: Bleeding is more common if the patient is taking blood thinners such as  aspirin, Coumadin, Ticiid, Plavix, etc., or if he/she have some genetic predisposition  such as hemophilia. Bleeding into the spinal canal can cause compression of the spinal  cord with subsequent paralysis.  This would require an emergency surgery to  decompress and there are no guarantees that the patient would recover from the  paralysis.       5. Pneumothorax:  Puncturing of a lung is a possibility, every time a needle is introduced in  the area of the chest or upper back.  Pneumothorax refers to free air around the  collapsed lung(s), inside of the thoracic cavity (chest cavity).  Another two possible  complications related to a similar event would include: Hemothorax and Chylothorax.   These are  variations of the Pneumothorax, where instead of air around the collapsed  lung(s), you may have blood or chyle, respectively.       6. Spinal headaches: They may occur with any procedures in the area of the spine.       7. Persistent CSF (Cerebro-Spinal Fluid) leakage: This is a rare problem, but may occur  with prolonged intrathecal or epidural catheters either due to the formation of a fistulous  track or a dural tear.       8. Nerve damage: By working so close to the spinal cord, there is always a possibility of  nerve damage, which could be as serious as a permanent spinal cord injury with  paralysis.       9. Death:  Although rare, severe deadly allergic reactions known as "Anaphylactic  reaction" can occur to any of  the medications used.      10. Worsening of the symptoms:  We can always make thing worse.  What are the chances of something like this happening? Chances of any of this occuring are extremely low.  By statistics, you have more of a chance of getting killed in a motor vehicle accident: while driving to the hospital than any of the above occurring .  Nevertheless, you should be aware that they are possibilities.  In general, it is similar to taking a shower.  Everybody knows that you can slip, hit your head and get killed.  Does that mean that you should not shower again?  Nevertheless always keep in mind that statistics do not mean anything if you happen to be on the wrong side of them.  Even if a procedure has a 1 (one) in a 1,000,000 (million) chance of going wrong, it you happen to be that one..Also, keep in mind that by statistics, you have more of a chance of having something go wrong when taking medications.  Who should not have this procedure? If you are on a blood thinning medication (e.g. Coumadin, Plavix, see list of "Blood Thinners"), or if you have an active infection going on, you should not have the procedure.  If you are taking any blood thinners, please inform your  physician.  How should I prepare for this procedure?  Do not eat or drink anything at least six hours prior to the procedure.  Bring a driver with you .  It cannot be a taxi.  Come accompanied by an adult that can drive you back, and that is strong enough to help you if your legs get weak or numb from the local anesthetic.  Take all of your medicines the morning of the procedure with just enough water to swallow them.  If you have diabetes, make sure that you are scheduled to have your procedure done first thing in the morning, whenever possible.  If you have diabetes, take only half of your insulin dose and notify our nurse that you have done so as soon as you arrive at the clinic.  If you are diabetic, but only take blood sugar pills (oral hypoglycemic), then do not take them on the morning of your procedure.  You may take them after you have had the procedure.  Do not take aspirin or any aspirin-containing medications, at least eleven (11) days prior to the procedure.  They may prolong bleeding.  Wear loose fitting clothing that may be easy to take off and that you would not mind if it got stained with Betadine or blood.  Do not wear any jewelry or perfume  Remove any nail coloring.  It will interfere with some of our monitoring equipment.  NOTE: Remember that this is not meant to be interpreted as a complete list of all possible complications.  Unforeseen problems may occur.  BLOOD THINNERS The following drugs contain aspirin or other products, which can cause increased bleeding during surgery and should not be taken for 2 weeks prior to and 1 week after surgery.  If you should need take something for relief of minor pain, you may take acetaminophen which is found in Tylenol,m Datril, Anacin-3 and Panadol. It is not blood thinner. The products listed below are.  Do not take any of the products listed below in addition to any listed on your instruction sheet.  A.P.C or A.P.C with  Codeine Codeine Phosphate Capsules #3 Ibuprofen Ridaura  ABC compound Congesprin  Imuran rimadil  Advil Cope Indocin Robaxisal  Alka-Seltzer Effervescent Pain Reliever and Antacid Coricidin or Coricidin-D  Indomethacin Rufen  Alka-Seltzer plus Cold Medicine Cosprin Ketoprofen S-A-C Tablets  Anacin Analgesic Tablets or Capsules Coumadin Korlgesic Salflex  Anacin Extra Strength Analgesic tablets or capsules CP-2 Tablets Lanoril Salicylate  Anaprox Cuprimine Capsules Levenox Salocol  Anexsia-D Dalteparin Magan Salsalate  Anodynos Darvon compound Magnesium Salicylate Sine-off  Ansaid Dasin Capsules Magsal Sodium Salicylate  Anturane Depen Capsules Marnal Soma  APF Arthritis pain formula Dewitt's Pills Measurin Stanback  Argesic Dia-Gesic Meclofenamic Sulfinpyrazone  Arthritis Bayer Timed Release Aspirin Diclofenac Meclomen Sulindac  Arthritis pain formula Anacin Dicumarol Medipren Supac  Analgesic (Safety coated) Arthralgen Diffunasal Mefanamic Suprofen  Arthritis Strength Bufferin Dihydrocodeine Mepro Compound Suprol  Arthropan liquid Dopirydamole Methcarbomol with Aspirin Synalgos  ASA tablets/Enseals Disalcid Micrainin Tagament  Ascriptin Doan's Midol Talwin  Ascriptin A/D Dolene Mobidin Tanderil  Ascriptin Extra Strength Dolobid Moblgesic Ticlid  Ascriptin with Codeine Doloprin or Doloprin with Codeine Momentum Tolectin  Asperbuf Duoprin Mono-gesic Trendar  Aspergum Duradyne Motrin or Motrin IB Triminicin  Aspirin plain, buffered or enteric coated Durasal Myochrisine Trigesic  Aspirin Suppositories Easprin Nalfon Trillsate  Aspirin with Codeine Ecotrin Regular or Extra Strength Naprosyn Uracel  Atromid-S Efficin Naproxen Ursinus  Auranofin Capsules Elmiron Neocylate Vanquish  Axotal Emagrin Norgesic Verin  Azathioprine Empirin or Empirin with Codeine Normiflo Vitamin E  Azolid Emprazil Nuprin Voltaren  Bayer Aspirin plain, buffered or children's or timed BC Tablets or powders  Encaprin Orgaran Warfarin Sodium  Buff-a-Comp Enoxaparin Orudis Zorpin  Buff-a-Comp with Codeine Equegesic Os-Cal-Gesic   Buffaprin Excedrin plain, buffered or Extra Strength Oxalid   Bufferin Arthritis Strength Feldene Oxphenbutazone   Bufferin plain or Extra Strength Feldene Capsules Oxycodone with Aspirin   Bufferin with Codeine Fenoprofen Fenoprofen Pabalate or Pabalate-SF   Buffets II Flogesic Panagesic   Buffinol plain or Extra Strength Florinal or Florinal with Codeine Panwarfarin   Buf-Tabs Flurbiprofen Penicillamine   Butalbital Compound Four-way cold tablets Penicillin   Butazolidin Fragmin Pepto-Bismol   Carbenicillin Geminisyn Percodan   Carna Arthritis Reliever Geopen Persantine   Carprofen Gold's salt Persistin   Chloramphenicol Goody's Phenylbutazone   Chloromycetin Haltrain Piroxlcam   Clmetidine heparin Plaquenil   Cllnoril Hyco-pap Ponstel   Clofibrate Hydroxy chloroquine Propoxyphen         Before stopping any of these medications, be sure to consult the physician who ordered them.  Some, such as Coumadin (Warfarin) are ordered to prevent or treat serious conditions such as "deep thrombosis", "pumonary embolisms", and other heart problems.  The amount of time that you may need off of the medication may also vary with the medication and the reason for which you were taking it.  If you are taking any of these medications, please make sure you notify your pain physician before you undergo any procedures.

## 2016-11-19 NOTE — Progress Notes (Signed)
Nursing Pain Medication Assessment:  Safety precautions to be maintained throughout the outpatient stay will include: orient to surroundings, keep bed in low position, maintain call bell within reach at all times, provide assistance with transfer out of bed and ambulation.  Medication Inspection Compliance: Pill count conducted under aseptic conditions, in front of the patient. Neither the pills nor the bottle was removed from the patient's sight at any time. Once count was completed pills were immediately returned to the patient in their original bottle.  Medication: Oxycodone IR Pill/Patch Count: 7 of 60 pills remain Bottle Appearance: Standard pharmacy container. Clearly labeled. Filled Date: 02/28 / 2018 Last Medication intake:  Yesterday   There is a prescription that was locked in our cabinet written 10-07-16, to be filled on 10-24-16, which patient was to pick up, she did not do so.

## 2016-12-02 ENCOUNTER — Ambulatory Visit (INDEPENDENT_AMBULATORY_CARE_PROVIDER_SITE_OTHER): Payer: Medicare Other | Admitting: Family Medicine

## 2016-12-02 ENCOUNTER — Encounter: Payer: Self-pay | Admitting: Family Medicine

## 2016-12-02 VITALS — BP 129/73 | HR 61 | Ht 60.0 in | Wt 225.0 lb

## 2016-12-02 DIAGNOSIS — F3342 Major depressive disorder, recurrent, in full remission: Secondary | ICD-10-CM

## 2016-12-02 DIAGNOSIS — I10 Essential (primary) hypertension: Secondary | ICD-10-CM | POA: Diagnosis not present

## 2016-12-02 DIAGNOSIS — E118 Type 2 diabetes mellitus with unspecified complications: Secondary | ICD-10-CM | POA: Diagnosis not present

## 2016-12-02 DIAGNOSIS — M159 Polyosteoarthritis, unspecified: Secondary | ICD-10-CM

## 2016-12-02 DIAGNOSIS — E7800 Pure hypercholesterolemia, unspecified: Secondary | ICD-10-CM | POA: Diagnosis not present

## 2016-12-02 DIAGNOSIS — M150 Primary generalized (osteo)arthritis: Secondary | ICD-10-CM

## 2016-12-02 LAB — LP+ALT+AST PICCOLO, WAIVED
ALT (SGPT) PICCOLO, WAIVED: 13 U/L (ref 10–47)
AST (SGOT) PICCOLO, WAIVED: 28 U/L (ref 11–38)
CHOL/HDL RATIO PICCOLO,WAIVE: 2.8 mg/dL
Cholesterol Piccolo, Waived: 208 mg/dL — ABNORMAL HIGH (ref <200)
HDL Chol Piccolo, Waived: 74 mg/dL (ref >59)
LDL Chol Calc Piccolo Waived: 110 mg/dL — ABNORMAL HIGH (ref <100)
TRIGLYCERIDES PICCOLO,WAIVED: 120 mg/dL (ref <150)
VLDL CHOL CALC PICCOLO,WAIVE: 24 mg/dL (ref <30)

## 2016-12-02 LAB — BAYER DCA HB A1C WAIVED: HB A1C (BAYER DCA - WAIVED): 6.4 % (ref <7.0)

## 2016-12-02 NOTE — Assessment & Plan Note (Signed)
Doing fair at best with pain management and pain medication from pain clinic

## 2016-12-02 NOTE — Progress Notes (Signed)
BP 129/73   Pulse 61   Ht 5' (1.524 m)   Wt 225 lb (102.1 kg)   SpO2 99%   BMI 43.94 kg/m    Subjective:    Patient ID: Cynthia Jones, female    DOB: 03-28-1941, 76 y.o.   MRN: [id number]  HPI: [name] is a 76 y.o. female  Chief Complaint  Patient presents with  . Follow-up  . Diabetes  . Hypertension  . Hyperlipidemia   Patient with diet controlled diabetes no complaints noted low blood sugar spells and apparently doing well. Hypertension doing well with medications no complaints. Arthritis of hands especially bothersome uses pain medication through pain clinic.  Anxiety doing well not taking Xanax anymore  Depression stable with duloxetine may be helping some with pain.   Relevant past medical, surgical, family and social history reviewed and updated as indicated. Interim medical history since our last visit reviewed. Allergies and medications reviewed and updated.  Review of Systems  Constitutional: Negative.   Respiratory: Negative.   Cardiovascular: Negative.     Per HPI unless specifically indicated above     Objective:    BP 129/73   Pulse 61   Ht 5' (1.524 m)   Wt 225 lb (102.1 kg)   SpO2 99%   BMI 43.94 kg/m   Wt Readings from Last 3 Encounters:  12/02/16 225 lb (102.1 kg)  11/19/16 222 lb (100.7 kg)  11/02/16 221 lb (100.2 kg)    Physical Exam  Constitutional: She is oriented to person, place, and time. She appears well-developed and well-nourished.  HENT:  Head: Normocephalic and atraumatic.  Eyes: Conjunctivae and EOM are normal.  Neck: Normal range of motion.  Cardiovascular: Normal rate, regular rhythm and normal heart sounds.   Pulmonary/Chest: Effort normal and breath sounds normal.  Musculoskeletal: Normal range of motion.  Neurological: She is alert and oriented to person, place, and time.  Skin: No erythema.  Psychiatric: She has a normal mood and affect. Her behavior is normal. Judgment and thought content  normal.    Results for orders placed or performed during the hospital encounter of 09/24/16  25-Hydroxyvitamin D Lcms D2+D3  Result Value Ref Range   25-Hydroxy, Vitamin D 20 (L) ng/mL   25-Hydroxy, Vitamin D-2 <1.0 ng/mL   25-Hydroxy, Vitamin D-3 19 ng/mL  C-reactive protein  Result Value Ref Range   CRP 1.1 (H) <1.0 mg/dL  Sedimentation rate  Result Value Ref Range   Sed Rate 44 (H) 0 - 30 mm/hr  Magnesium  Result Value Ref Range   Magnesium 2.1 1.7 - 2.4 mg/dL  Vitamin K74  Result Value Ref Range   Vitamin B-12 344 180 - 914 pg/mL      Assessment & Plan:   Problem List Items Addressed This Visit      Cardiovascular and Mediastinum   Essential hypertension - Primary    The current medical regimen is effective;  continue present plan and medications.       Relevant Orders   Basic metabolic panel   Bayer DCA Hb Q5Z Waived   LP+ALT+AST Piccolo, Waived     Endocrine   Diabetes mellitus (HCC)    The current medical regimen is effective;  continue present plan and medications.       Relevant Orders   Basic metabolic panel   Bayer DCA Hb D6L Waived   LP+ALT+AST Piccolo, Waived     Musculoskeletal and Integument   Osteoarthritis (Chronic)    Doing  fair at best with pain management and pain medication from pain clinic        Other   Depression    The current medical regimen is effective;  continue present plan and medications.       Hypercholesteremia    The current medical regimen is effective;  continue present plan and medications.       Relevant Orders   Basic metabolic panel   Bayer DCA Hb U9W Waived   LP+ALT+AST Piccolo, Waived       Follow up plan: Return for Physical Exam in August, Hemoglobin A1c.

## 2016-12-02 NOTE — Assessment & Plan Note (Signed)
The current medical regimen is effective;  continue present plan and medications.  

## 2016-12-03 ENCOUNTER — Encounter: Payer: Self-pay | Admitting: Family Medicine

## 2016-12-03 LAB — BASIC METABOLIC PANEL
BUN/Creatinine Ratio: 16 (ref 12–28)
BUN: 10 mg/dL (ref 8–27)
CALCIUM: 9.3 mg/dL (ref 8.7–10.3)
CHLORIDE: 98 mmol/L (ref 96–106)
CO2: 25 mmol/L (ref 18–29)
Creatinine, Ser: 0.64 mg/dL (ref 0.57–1.00)
GFR calc Af Amer: 102 mL/min/{1.73_m2} (ref >59)
GFR calc non Af Amer: 88 mL/min/{1.73_m2} (ref >59)
Glucose: 90 mg/dL (ref 65–99)
POTASSIUM: 4.4 mmol/L (ref 3.5–5.2)
Sodium: 138 mmol/L (ref 134–144)

## 2016-12-10 ENCOUNTER — Telehealth: Payer: Self-pay

## 2016-12-10 MED ORDER — GLUCOSE BLOOD VI STRP: ORAL_STRIP | 12 refills | Status: DC

## 2016-12-14 NOTE — Telephone Encounter (Signed)
Sent in on Friday.

## 2016-12-14 NOTE — Telephone Encounter (Signed)
Patient called to make sure Dr Dossie Arbour sends in her correct script for patients diabetic strips.

## 2016-12-16 ENCOUNTER — Ambulatory Visit
Admission: RE | Admit: 2016-12-16 | Discharge: 2016-12-16 | Disposition: A | Discharge status: Home / Self Care | Payer: Medicare Other | Source: Ambulatory Visit | Attending: Pain Medicine | Admitting: Pain Medicine

## 2016-12-16 ENCOUNTER — Encounter: Payer: Self-pay | Admitting: Pain Medicine

## 2016-12-16 ENCOUNTER — Other Ambulatory Visit: Payer: Self-pay | Admitting: Nurse Practitioner

## 2016-12-16 ENCOUNTER — Ambulatory Visit (HOSPITAL_BASED_OUTPATIENT_CLINIC_OR_DEPARTMENT_OTHER): Payer: Medicare Other | Admitting: Pain Medicine

## 2016-12-16 VITALS — BP 118/60 | HR 68 | Temp 97.7°F | Resp 16 | Ht 60.0 in | Wt 220.0 lb

## 2016-12-16 DIAGNOSIS — M25561 Pain in right knee: Secondary | ICD-10-CM | POA: Diagnosis not present

## 2016-12-16 DIAGNOSIS — Z96651 Presence of right artificial knee joint: Secondary | ICD-10-CM | POA: Diagnosis not present

## 2016-12-16 DIAGNOSIS — M1712 Unilateral primary osteoarthritis, left knee: Secondary | ICD-10-CM

## 2016-12-16 DIAGNOSIS — G8929 Other chronic pain: Secondary | ICD-10-CM | POA: Insufficient documentation

## 2016-12-16 DIAGNOSIS — M25562 Pain in left knee: Secondary | ICD-10-CM | POA: Insufficient documentation

## 2016-12-16 MED ORDER — FENTANYL CITRATE (PF) 100 MCG/2ML IJ SOLN
25.0000 ug | INTRAMUSCULAR | Status: DC | PRN
Start: 2016-12-16 — End: 2016-12-16
  Administered 2016-12-16: 100 ug via INTRAVENOUS
  Filled 2016-12-16: qty 2

## 2016-12-16 MED ORDER — METHYLPREDNISOLONE ACETATE 80 MG/ML IJ SUSP
80.0000 mg | Freq: Once | INTRAMUSCULAR | Status: AC
  Administered 2016-12-16: 80 mg
  Filled 2016-12-16: qty 1

## 2016-12-16 MED ORDER — LIDOCAINE HCL (PF) 1 % IJ SOLN
5.0000 mL | Freq: Once | INTRAMUSCULAR | Status: AC
  Administered 2016-12-16: 5 mL
  Filled 2016-12-16: qty 5

## 2016-12-16 MED ORDER — MIDAZOLAM HCL 5 MG/5ML IJ SOLN
1.0000 mg | INTRAMUSCULAR | Status: DC | PRN
  Administered 2016-12-16: 2 mg via INTRAVENOUS
  Filled 2016-12-16: qty 5

## 2016-12-16 MED ORDER — SODIUM HYALURONATE (VISCOSUP) 20 MG/2ML IX SOSY
2.0000 mL | PREFILLED_SYRINGE | Freq: Once | INTRA_ARTICULAR | Status: AC
  Administered 2016-12-16: 2 mL via INTRA_ARTICULAR
  Filled 2016-12-16: qty 2

## 2016-12-16 MED ORDER — LACTATED RINGERS IV SOLN
1000.0000 mL | Freq: Once | INTRAVENOUS | Status: AC
  Administered 2016-12-16: 1000 mL via INTRAVENOUS

## 2016-12-16 MED ORDER — ROPIVACAINE HCL 2 MG/ML IJ SOLN
9.0000 mL | Freq: Once | INTRAMUSCULAR | Status: AC
  Administered 2016-12-16: 9 mL
  Filled 2016-12-16: qty 10

## 2016-12-16 MED ORDER — VITAMIN D (ERGOCALCIFEROL) 1.25 MG (50000 UNIT) PO CAPS: 50000.0000 [IU] | ORAL_CAPSULE | ORAL | 0 refills | Status: DC

## 2016-12-16 MED ORDER — LIDOCAINE HCL (PF) 1 % IJ SOLN
10.0000 mL | Freq: Once | INTRAMUSCULAR | Status: AC
  Administered 2016-12-16: 5 mL
  Filled 2016-12-16: qty 5

## 2016-12-16 NOTE — Patient Instructions (Addendum)
Post-Procedure instructions Instructions:  Apply ice: Fill a plastic sandwich bag with crushed ice. Cover it with a small towel and apply to injection site. Apply for 15 minutes then remove x 15 minutes. Repeat sequence on day of procedure, until you go to bed. The purpose is to minimize swelling and discomfort after procedure.  Apply heat: Apply heat to procedure site starting the day following the procedure. The purpose is to treat any soreness and discomfort from the procedure.  Food intake: Start with clear liquids (like water) and advance to regular food, as tolerated.   Physical activities: Keep activities to a minimum for the first 8 hours after the procedure.   Driving: If you have received any sedation, you are not allowed to drive for 24 hours after your procedure.  Blood thinner: Restart your blood thinner 6 hours after your procedure. (Only for those taking blood thinners)  Insulin: As soon as you can eat, you may resume your normal dosing schedule. (Only for those taking insulin)  Infection prevention: Keep procedure site clean and dry.  Post-procedure Pain Diary: Extremely important that this be done correctly and accurately. Recorded information will be used to determine the next step in treatment.  Pain evaluated is that of treated area only. Do not include pain from an untreated area.  Complete every hour, on the hour, for the initial 8 hours. Set an alarm to help you do this part accurately.  Do not go to sleep and have it completed later. It will not be accurate.  Follow-up appointment: Keep your follow-up appointment after the procedure. Usually 2 weeks for most procedures. (6 weeks in the case of radiofrequency.) Bring you pain diary.  Expect:  From numbing medicine (AKA: Local Anesthetics): Numbness or decrease in pain.  Onset: Full effect within 15 minutes of injected.  Duration: It will depend on the type of local anesthetic used. On the average, 1 to 8  hours.   From steroids: Decrease in swelling or inflammation. Once inflammation is improved, relief of the pain will follow.  Onset of benefits: Depends on the amount of swelling present. The more swelling, the longer it will take for the benefits to be seen.   Duration: Steroids will stay in the system x 2 weeks. Duration of benefits will depend on multiple posibilities including persistent irritating factors.  From procedure: Some discomfort is to be expected once the numbing medicine wears off. This should be minimal if ice and heat are applied as instructed. Call if:  You experience numbness and weakness that gets worse with time, as opposed to wearing off.  New onset bowel or bladder incontinence. (Spinal procedures only)  Emergency Numbers:  Durning business hours (Monday - Thursday, 8:00 AM - 4:00 PM) (Friday, 9:00 AM - 12:00 Noon): [phone]  After hours: [phone] bE CAREFUL BEFORE WALKING- kNEES MIGHT BE NUMB---DONT FALL

## 2016-12-16 NOTE — Progress Notes (Addendum)
Patient's Name: Cynthia Jones  MRN: [id number]  Referring Provider: Steele Sizer, MD  DOB: February 13, 1941  PCP: Steele Sizer, MD  DOS: 12/16/2016  Note by: Sydnee Levans. Laban Emperor, MD  Service setting: Ambulatory outpatient  Location: [facility] (AMB) Pain Management Facility  Visit type: Procedure  Specialty: Interventional Pain Management  Patient type: Established   Primary Reason for Visit: Interventional Pain Management Treatment. CC: Knee Pain (bilateral)  Procedure #1:  Anesthesia, Analgesia, Anxiolysis:  Type: Diagnostic Intra-Articular Hyalgan Knee Injection #1 Region: Lateral infrapatellar Knee Region Level: Knee Joint Laterality: Left knee  Type: Local Anesthesia Local Anesthetic: Lidocaine 1% Route: Infiltration ([place]/IM) IV Access: Declined Sedation: Declined  Indication(s): Analgesia           Procedure #2:  Anesthesia, Analgesia, Anxiolysis:  Type: Diagnostic Superior-lateral, Superior-medial, and Inferior-medial, Genicular Nerves Block. (CPT [phone]) Region: Lateral, Anterior, and Medial aspects of the knee joint, above and below the knee joint proper. Level: Superior and inferior to the knee joint. Laterality: Right  Type: Local Anesthesia with Moderate (Conscious) Sedation Local Anesthetic: Lidocaine 1% Route: Intravenous (IV) IV Access: Secured Sedation: Meaningful verbal contact was maintained at all times during the procedure  Indication(s): Analgesia and Anxiety  Indications: 1. Chronic knee pain (Location of Primary Source of Pain) (Bilateral) (L>R)   2. Osteoarthritis of knee (Location of Primary Source of Pain) (Left)   3. S/P TKR Arthroplasty (Right)    Pain Score: Pre-procedure: 4 /10 Post-procedure: 0-No pain/10  Pre-op Assessment:  Previous date of service: 11/19/16 Service provided: Evaluation Cynthia Jones is a 76 y.o. (year old), female patient, seen today for interventional treatment. She  has a past surgical history that includes Parotidectomy  (Left); Appendectomy; Cholecystectomy; Abdominal hysterectomy; Tonsillectomy; Joint replacement (2014); and Eye surgery. Her primarily concern today is the Knee Pain (bilateral)  Initial Vital Signs: Blood pressure (!) 134/50, pulse 70, temperature 98.2 F (36.8 C), temperature source Oral, resp. rate 16, height 5' (1.524 m), weight 220 lb (99.8 kg), SpO2 99 %. BMI: 42.97 kg/m  Risk Assessment: Allergies: Reviewed. She is allergic to nsaids and erythromycin.  Allergy Precautions: None required Coagulopathies: "Reviewed. None identified.  Blood-thinner therapy: None at this time Active Infection(s): Reviewed. None identified. Cynthia Jones is afebrile  Site Confirmation: Cynthia Jones was asked to confirm the procedure and laterality before marking the site Procedure checklist: Completed Consent: Before the procedure and under the influence of no sedative(s), amnesic(s), or anxiolytics, the patient was informed of the treatment options, risks and possible complications. To fulfill our ethical and legal obligations, as recommended by the American Medical Association's Code of Ethics, I have informed the patient of my clinical impression; the nature and purpose of the treatment or procedure; the risks, benefits, and possible complications of the intervention; the alternatives, including doing nothing; the risk(s) and benefit(s) of the alternative treatment(s) or procedure(s); and the risk(s) and benefit(s) of doing nothing. The patient was provided information about the general risks and possible complications associated with the procedure. These may include, but are not limited to: failure to achieve desired goals, infection, bleeding, organ or nerve damage, allergic reactions, paralysis, and death. In addition, the patient was informed of those risks and complications associated to the procedure, such as failure to decrease pain; infection; bleeding; organ or nerve damage with subsequent damage to  sensory, motor, and/or autonomic systems, resulting in permanent pain, numbness, and/or weakness of one or several areas of the body; allergic reactions; (i.e.: anaphylactic reaction); and/or death. Furthermore, the patient  was informed of those risks and complications associated with the medications. These include, but are not limited to: allergic reactions (i.e.: anaphylactic or anaphylactoid reaction(s)); adrenal axis suppression; blood sugar elevation that in diabetics may result in ketoacidosis or comma; water retention that in patients with history of congestive heart failure may result in shortness of breath, pulmonary edema, and decompensation with resultant heart failure; weight gain; swelling or edema; medication-induced neural toxicity; particulate matter embolism and blood vessel occlusion with resultant organ, and/or nervous system infarction; and/or aseptic necrosis of one or more joints. Finally, the patient was informed that Medicine is not an exact science; therefore, there is also the possibility of unforeseen or unpredictable risks and/or possible complications that may result in a catastrophic outcome. The patient indicated having understood very clearly. We have given the patient no guarantees and we have made no promises. Enough time was given to the patient to ask questions, all of which were answered to the patient's satisfaction. Cynthia Jones has indicated that she wanted to continue with the procedure. Attestation: I, the ordering provider, attest that I have discussed with the patient the benefits, risks, side-effects, alternatives, likelihood of achieving goals, and potential problems during recovery for the procedure that I have provided informed consent. Date: 12/16/2016; Time: 9:42 AM  Pre-Procedure Preparation:  Monitoring: As per clinic protocol. Respiration, ETCO2, SpO2, BP, heart rate and rhythm monitor placed and checked for adequate function Safety Precautions: Patient  was assessed for positional comfort and pressure points before starting the procedure. Time-out: I initiated and conducted the "Time-out" before starting the procedure, as per protocol. The patient was asked to participate by confirming the accuracy of the "Time Out" information. Verification of the correct person, site, and procedure were performed and confirmed by me, the nursing staff, and the patient. "Time-out" conducted as per Joint Commission's Universal Protocol (UP.01.01.01). "Time-out" Date & Time: 12/16/2016; 1024 hrs.  Description of Procedure #1 Process:   Position: Supine Target Area: Knee Joint Approach: Just above the Lateral tibial plateau, lateral to the infrapatellar tendon. Area Prepped: Entire knee area, from the mid-thigh to the mid-shin. Prepping solution: ChloraPrep (2% chlorhexidine gluconate and 70% isopropyl alcohol) Safety Precautions: Aspiration looking for blood return was conducted prior to all injections. At no point did we inject any substances, as a needle was being advanced. No attempts were made at seeking any paresthesias. Safe injection practices and needle disposal techniques used. Medications properly checked for expiration dates. SDV (single dose vial) medications used. Description of the Procedure: Protocol guidelines were followed. The patient was placed in position over the fluoroscopy table. The target area was identified and the area prepped in the usual manner. Skin desensitized using vapocoolant spray. Skin & deeper tissues infiltrated with local anesthetic. Appropriate amount of time allowed to pass for local anesthetics to take effect. The procedure needles were then advanced to the target area. Proper needle placement secured. Negative aspiration confirmed. Solution injected in intermittent fashion, asking for systemic symptoms every 0.5cc of injectate. The needles were then removed and the area cleansed, making sure to leave some of the prepping solution  back to take advantage of its long term bactericidal properties. Vitals:   12/16/16 1036 12/16/16 1045 12/16/16 1055 12/16/16 1105  BP: (!) 122/56 (!) 130/58 (!) 120/57 118/60  Pulse:    68  Resp: 13 14 16 16   Temp:    97.7 F (36.5 C)  TempSrc:    Tympanic  SpO2: 94% 95% 97% 97%  Weight:  Height:        Start Time: 1021 hrs. Materials:  Needle(s) Type: Regular needle Gauge: 22G Length: 1.5-in Medication(s): We administered fentaNYL, lactated ringers, midazolam, Sodium Hyaluronate, lidocaine (PF), methylPREDNISolone acetate, ropivacaine (PF) 2 mg/mL (0.2%), and lidocaine (PF). Please see chart orders for dosing details.  Imaging Guidance for Procedure #1:  Type of Imaging Technique: None used Indication(s): N/A Exposure Time: No patient exposure Contrast: None used. Fluoroscopic Guidance: N/A Ultrasound Guidance: N/A Interpretation: N/A  Description of Procedure #2 Process:  Position: Supine Target Area: For Genicular Nerve block(s), the targets are: the superior-lateral genicular nerve, located in the lateral distal portion of the femoral shaft as it curves to form the lateral epicondyle, in the region of the distal femoral metaphysis; the superior-medial genicular nerve, located in the medial distal portion of the femoral shaft as it curves to form the medial epicondyle; and the inferior-medial genicular nerve, located in the medial, proximal portion of the tibial shaft, as it curves to form the medial epicondyle, in the region of the proximal tibial metaphysis. Approach: Anterior, ipsilateral approach. Area Prepped: Entire knee area, from mid-thigh to mid-shin, lateral, anterior, and medial aspects. Prepping solution: ChloraPrep (2% chlorhexidine gluconate and 70% isopropyl alcohol) Safety Precautions: Aspiration looking for blood return was conducted prior to all injections. At no point did we inject any substances, as a needle was being advanced. No attempts were made at  seeking any paresthesias. Safe injection practices and needle disposal techniques used. Medications properly checked for expiration dates. SDV (single dose vial) medications used. Latex Allergy precautions taken.   Description of the Procedure: Protocol guidelines were followed. The patient was placed in position over the procedure table. The target area was identified and the area prepped in the usual manner. Skin desensitized using vapocoolant spray. Skin & deeper tissues infiltrated with local anesthetic. Appropriate amount of time allowed to pass for local anesthetics to take effect. The procedure needles were then advanced to the target area. Proper needle placement secured. Negative aspiration confirmed. Solution injected in intermittent fashion, asking for systemic symptoms every 0.5cc of injectate. The needles were then removed and the area cleansed, making sure to leave some of the prepping solution back to take advantage of its long term bactericidal properties. End Time: 1035 hrs. Materials:  Needle(s) Type: Regular needle Gauge: 22G Length: 3.5-in Medication(s): We administered lactated ringers. Please see chart orders for dosing details.  Imaging Guidance Procedure #2 (Non-Spinal):  Type of Imaging Technique: Fluoroscopy Guidance (Non-Spinal) Indication(s): Assistance in needle guidance and placement for procedures requiring needle placement in or near specific anatomical locations not easily accessible without such assistance. Exposure Time: Please see nurses notes. Contrast: Before injecting any contrast, we confirmed that the patient did not have an allergy to iodine, shellfish, or radiological contrast. Once satisfactory needle placement was completed at the desired level, radiological contrast was injected. Contrast injected under live fluoroscopy. No contrast complications. See chart for type and volume of contrast used. Fluoroscopic Guidance: I was personally present during the use  of fluoroscopy. "Tunnel Vision Technique" used to obtain the best possible view of the target area. Parallax error corrected before commencing the procedure. "Direction-depth-direction" technique used to introduce the needle under continuous pulsed fluoroscopy. Once target was reached, antero-posterior, oblique, and lateral fluoroscopic projection used confirm needle placement in all planes. Images permanently stored in EMR. Interpretation: I personally interpreted the imaging intraoperatively. Adequate needle placement confirmed in multiple planes. Appropriate spread of contrast into desired area was observed. No evidence  of afferent or efferent intravascular uptake. Permanent images saved into the patient's record.  Antibiotic Prophylaxis:  Indication(s): None identified Antibiotic given: None  Post-operative Assessment:  EBL: None Complications: No immediate post-treatment complications observed by team, or reported by patient. Note: The patient tolerated the entire procedure well. A repeat set of vitals were taken after the procedure and the patient was kept under observation following institutional policy, for this type of procedure. Post-procedural neurological assessment was performed, showing return to baseline, prior to discharge. The patient was provided with post-procedure discharge instructions, including a section on how to identify potential problems. Should any problems arise concerning this procedure, the patient was given instructions to immediately contact us, at any time, without hesitation. In any case, we plan to contact the patient by telephone for a follow-up status report regarding this interventional procedure. Comments:  No additional relevant information.  Plan of Care  Disposition: Discharge home  Discharge Date & Time: 12/16/2016; 1107 hrs.  Physician-requested Follow-up:  Return in about 2 weeks (around 12/30/2016) for Post-Procedure evaluation, in addition, Procedure  left intra-articular Hyalgan knee injection #2.  Future Appointments Date Time Provider Department Center  12/30/2016 10:15 AM Delano Metz, MD [facility] None  02/08/2017 8:40 AM Barbette Merino, NP [facility] None  04/05/2017 10:00 AM Steele Sizer, MD [facility] None   Medications ordered for procedure: Meds ordered this encounter  Medications  . fentaNYL (SUBLIMAZE) injection 25-50 mcg    Make sure Narcan is available in the pyxis when using this medication. In the event of respiratory depression (RR< 8/min): Titrate NARCAN (naloxone) in increments of 0.1 to 0.2 mg IV at 2-3 minute intervals, until desired degree of reversal.  . lactated ringers infusion 1,000 mL  . midazolam (VERSED) 5 MG/5ML injection 1-2 mg    Make sure Flumazenil is available in the pyxis when using this medication. If oversedation occurs, administer 0.2 mg IV over 15 sec. If after 45 sec no response, administer 0.2 mg again over 1 min; may repeat at 1 min intervals; not to exceed 4 doses (1 mg)  . Sodium Hyaluronate SOSY 2 mL  . lidocaine (PF) (XYLOCAINE) 1 % injection 5 mL  . methylPREDNISolone acetate (DEPO-MEDROL) injection 80 mg  . ropivacaine (PF) 2 mg/mL (0.2%) (NAROPIN) injection 9 mL  . lidocaine (PF) (XYLOCAINE) 1 % injection 10 mL   Medications administered: We administered fentaNYL, lactated ringers, midazolam, Sodium Hyaluronate, lidocaine (PF), methylPREDNISolone acetate, ropivacaine (PF) 2 mg/mL (0.2%), and lidocaine (PF).  See the medical record for exact dosing, route, and time of administration.  Lab-work, Procedure(s), & Referral(s) Ordered: Orders Placed This Encounter  Procedures  . KNEE INJECTION  . GENICULAR NERVE BLOCK  . DG C-Arm 1-60 Min-No Report  . Discharge instructions  . Follow-up  . Informed Consent Details: Transcribe to consent form and obtain patient signature  . Provider attestation of informed consent for procedure/surgical case  . Verify informed consent   Imaging  Ordered: Results for orders placed in visit on 10/07/16  DG C-Arm 1-60 Min-No Report   Narrative Fluoroscopy was utilized by the requesting physician.  No radiographic  interpretation.    New Prescriptions   No medications on file   Primary Care Physician: Steele Sizer, MD Location: [facility] Note by: Sydnee Levans. Laban Emperor, M.D, DABA, DABAPM, DABPM, DABIPP, FIPP Date: 12/16/2016; Time: 12:55 PM  Disclaimer:  Medicine is not an exact science. The only guarantee in medicine is that nothing is guaranteed. It is important  to note that the decision to proceed with this intervention was based on the information collected from the patient. The Data and conclusions were drawn from the patient's questionnaire, the interview, and the physical examination. Because the information was provided in large part by the patient, it cannot be guaranteed that it has not been purposely or unconsciously manipulated. Every effort has been made to obtain as much relevant data as possible for this evaluation. It is important to note that the conclusions that lead to this procedure are derived in large part from the available data. Always take into account that the treatment will also be dependent on availability of resources and existing treatment guidelines, considered by other Pain Management Practitioners as being common knowledge and practice, at the time of the intervention. For Medico-Legal purposes, it is also important to point out that variation in procedural techniques and pharmacological choices are the acceptable norm. The indications, contraindications, technique, and results of the above procedure should only be interpreted and judged by a Board-Certified Interventional Pain Specialist with extensive familiarity and expertise in the same exact procedure and technique.   Instructions provided at this appointment: Patient Instructions  Post-Procedure  instructions Instructions:  Apply ice: Fill a plastic sandwich bag with crushed ice. Cover it with a small towel and apply to injection site. Apply for 15 minutes then remove x 15 minutes. Repeat sequence on day of procedure, until you go to bed. The purpose is to minimize swelling and discomfort after procedure.  Apply heat: Apply heat to procedure site starting the day following the procedure. The purpose is to treat any soreness and discomfort from the procedure.  Food intake: Start with clear liquids (like water) and advance to regular food, as tolerated.   Physical activities: Keep activities to a minimum for the first 8 hours after the procedure.   Driving: If you have received any sedation, you are not allowed to drive for 24 hours after your procedure.  Blood thinner: Restart your blood thinner 6 hours after your procedure. (Only for those taking blood thinners)  Insulin: As soon as you can eat, you may resume your normal dosing schedule. (Only for those taking insulin)  Infection prevention: Keep procedure site clean and dry.  Post-procedure Pain Diary: Extremely important that this be done correctly and accurately. Recorded information will be used to determine the next step in treatment.  Pain evaluated is that of treated area only. Do not include pain from an untreated area.  Complete every hour, on the hour, for the initial 8 hours. Set an alarm to help you do this part accurately.  Do not go to sleep and have it completed later. It will not be accurate.  Follow-up appointment: Keep your follow-up appointment after the procedure. Usually 2 weeks for most procedures. (6 weeks in the case of radiofrequency.) Bring you pain diary.  Expect:  From numbing medicine (AKA: Local Anesthetics): Numbness or decrease in pain.  Onset: Full effect within 15 minutes of injected.  Duration: It will depend on the type of local anesthetic used. On the average, 1 to 8 hours.   From  steroids: Decrease in swelling or inflammation. Once inflammation is improved, relief of the pain will follow.  Onset of benefits: Depends on the amount of swelling present. The more swelling, the longer it will take for the benefits to be seen.   Duration: Steroids will stay in the system x 2 weeks. Duration of benefits will depend on multiple posibilities including persistent irritating  factors.  From procedure: Some discomfort is to be expected once the numbing medicine wears off. This should be minimal if ice and heat are applied as instructed. Call if:  You experience numbness and weakness that gets worse with time, as opposed to wearing off.  New onset bowel or bladder incontinence. (Spinal procedures only)  Emergency Numbers:  Durning business hours (Monday - Thursday, 8:00 AM - 4:00 PM) (Friday, 9:00 AM - 12:00 Noon): [phone]  After hours: [phone] bE CAREFUL BEFORE WALKING- kNEES MIGHT BE NUMB---DONT FALL

## 2016-12-16 NOTE — Progress Notes (Signed)
Safety precautions to be maintained throughout the outpatient stay will include: orient to surroundings, keep bed in low position, maintain call bell within reach at all times, provide assistance with transfer out of bed and ambulation.  

## 2016-12-17 ENCOUNTER — Telehealth: Payer: Self-pay

## 2016-12-17 NOTE — Telephone Encounter (Signed)
Post procedure phone call. States she is doing fine. 

## 2016-12-30 ENCOUNTER — Encounter: Payer: Self-pay | Admitting: Pain Medicine

## 2016-12-30 ENCOUNTER — Ambulatory Visit: Payer: Medicare Other | Attending: Pain Medicine | Admitting: Pain Medicine

## 2016-12-30 VITALS — BP 142/63 | HR 73 | Temp 98.4°F | Resp 18 | Ht 60.0 in | Wt 220.0 lb

## 2016-12-30 DIAGNOSIS — Z96651 Presence of right artificial knee joint: Secondary | ICD-10-CM | POA: Insufficient documentation

## 2016-12-30 DIAGNOSIS — F329 Major depressive disorder, single episode, unspecified: Secondary | ICD-10-CM | POA: Diagnosis not present

## 2016-12-30 DIAGNOSIS — E669 Obesity, unspecified: Secondary | ICD-10-CM | POA: Diagnosis not present

## 2016-12-30 DIAGNOSIS — G4700 Insomnia, unspecified: Secondary | ICD-10-CM | POA: Insufficient documentation

## 2016-12-30 DIAGNOSIS — M797 Fibromyalgia: Secondary | ICD-10-CM | POA: Insufficient documentation

## 2016-12-30 DIAGNOSIS — M8580 Other specified disorders of bone density and structure, unspecified site: Secondary | ICD-10-CM | POA: Insufficient documentation

## 2016-12-30 DIAGNOSIS — L304 Erythema intertrigo: Secondary | ICD-10-CM | POA: Insufficient documentation

## 2016-12-30 DIAGNOSIS — M25552 Pain in left hip: Secondary | ICD-10-CM | POA: Insufficient documentation

## 2016-12-30 DIAGNOSIS — L739 Follicular disorder, unspecified: Secondary | ICD-10-CM | POA: Insufficient documentation

## 2016-12-30 DIAGNOSIS — M25561 Pain in right knee: Secondary | ICD-10-CM

## 2016-12-30 DIAGNOSIS — I10 Essential (primary) hypertension: Secondary | ICD-10-CM | POA: Diagnosis not present

## 2016-12-30 DIAGNOSIS — M25551 Pain in right hip: Secondary | ICD-10-CM | POA: Insufficient documentation

## 2016-12-30 DIAGNOSIS — G894 Chronic pain syndrome: Secondary | ICD-10-CM | POA: Diagnosis not present

## 2016-12-30 DIAGNOSIS — M545 Low back pain: Secondary | ICD-10-CM

## 2016-12-30 DIAGNOSIS — E7800 Pure hypercholesterolemia, unspecified: Secondary | ICD-10-CM | POA: Diagnosis not present

## 2016-12-30 DIAGNOSIS — Z79891 Long term (current) use of opiate analgesic: Secondary | ICD-10-CM | POA: Diagnosis not present

## 2016-12-30 DIAGNOSIS — M25562 Pain in left knee: Secondary | ICD-10-CM | POA: Insufficient documentation

## 2016-12-30 DIAGNOSIS — M5481 Occipital neuralgia: Secondary | ICD-10-CM | POA: Insufficient documentation

## 2016-12-30 DIAGNOSIS — M47816 Spondylosis without myelopathy or radiculopathy, lumbar region: Secondary | ICD-10-CM | POA: Diagnosis not present

## 2016-12-30 DIAGNOSIS — R51 Headache: Secondary | ICD-10-CM | POA: Diagnosis not present

## 2016-12-30 DIAGNOSIS — M79641 Pain in right hand: Secondary | ICD-10-CM | POA: Diagnosis not present

## 2016-12-30 DIAGNOSIS — M170 Bilateral primary osteoarthritis of knee: Secondary | ICD-10-CM | POA: Insufficient documentation

## 2016-12-30 DIAGNOSIS — G8929 Other chronic pain: Secondary | ICD-10-CM | POA: Diagnosis not present

## 2016-12-30 DIAGNOSIS — M1712 Unilateral primary osteoarthritis, left knee: Secondary | ICD-10-CM | POA: Diagnosis not present

## 2016-12-30 DIAGNOSIS — M25569 Pain in unspecified knee: Secondary | ICD-10-CM | POA: Insufficient documentation

## 2016-12-30 DIAGNOSIS — E1140 Type 2 diabetes mellitus with diabetic neuropathy, unspecified: Secondary | ICD-10-CM | POA: Diagnosis not present

## 2016-12-30 DIAGNOSIS — Z794 Long term (current) use of insulin: Secondary | ICD-10-CM | POA: Insufficient documentation

## 2016-12-30 DIAGNOSIS — J45909 Unspecified asthma, uncomplicated: Secondary | ICD-10-CM | POA: Insufficient documentation

## 2016-12-30 NOTE — Patient Instructions (Signed)
GENERAL RISKS AND COMPLICATIONS  What are the risk, side effects and possible complications? Generally speaking, most procedures are safe.  However, with any procedure there are risks, side effects, and the possibility of complications.  The risks and complications are dependent upon the sites that are lesioned, or the type of nerve block to be performed.  The closer the procedure is to the spine, the more serious the risks are.  Great care is taken when placing the radio frequency needles, block needles or lesioning probes, but sometimes complications can occur. 1. Infection: Any time there is an injection through the skin, there is a risk of infection.  This is why sterile conditions are used for these blocks.  There are four possible types of infection. 1. Localized skin infection. 2. Central Nervous System Infection-This can be in the form of Meningitis, which can be deadly. 3. Epidural Infections-This can be in the form of an epidural abscess, which can cause pressure inside of the spine, causing compression of the spinal cord with subsequent paralysis. This would require an emergency surgery to decompress, and there are no guarantees that the patient would recover from the paralysis. 4. Discitis-This is an infection of the intervertebral discs.  It occurs in about 1% of discography procedures.  It is difficult to treat and it may lead to surgery.        2. Pain: the needles have to go through skin and soft tissues, will cause soreness.       3. Damage to internal structures:  The nerves to be lesioned may be near blood vessels or    other nerves which can be potentially damaged.       4. Bleeding: Bleeding is more common if the patient is taking blood thinners such as  aspirin, Coumadin, Ticiid, Plavix, etc., or if he/she have some genetic predisposition  such as hemophilia. Bleeding into the spinal canal can cause compression of the spinal  cord with subsequent paralysis.  This would require an  emergency surgery to  decompress and there are no guarantees that the patient would recover from the  paralysis.       5. Pneumothorax:  Puncturing of a lung is a possibility, every time a needle is introduced in  the area of the chest or upper back.  Pneumothorax refers to free air around the  collapsed lung(s), inside of the thoracic cavity (chest cavity).  Another two possible  complications related to a similar event would include: Hemothorax and Chylothorax.   These are variations of the Pneumothorax, where instead of air around the collapsed  lung(s), you may have blood or chyle, respectively.       6. Spinal headaches: They may occur with any procedures in the area of the spine.       7. Persistent CSF (Cerebro-Spinal Fluid) leakage: This is a rare problem, but may occur  with prolonged intrathecal or epidural catheters either due to the formation of a fistulous  track or a dural tear.       8. Nerve damage: By working so close to the spinal cord, there is always a possibility of  nerve damage, which could be as serious as a permanent spinal cord injury with  paralysis.       9. Death:  Although rare, severe deadly allergic reactions known as "Anaphylactic  reaction" can occur to any of the medications used.      10. Worsening of the symptoms:  We can always make thing worse.    What are the chances of something like this happening? Chances of any of this occuring are extremely low.  By statistics, you have more of a chance of getting killed in a motor vehicle accident: while driving to the hospital than any of the above occurring .  Nevertheless, you should be aware that they are possibilities.  In general, it is similar to taking a shower.  Everybody knows that you can slip, hit your head and get killed.  Does that mean that you should not shower again?  Nevertheless always keep in mind that statistics do not mean anything if you happen to be on the wrong side of them.  Even if a procedure has a 1  (one) in a 1,000,000 (million) chance of going wrong, it you happen to be that one..Also, keep in mind that by statistics, you have more of a chance of having something go wrong when taking medications.  Who should not have this procedure? If you are on a blood thinning medication (e.g. Coumadin, Plavix, see list of "Blood Thinners"), or if you have an active infection going on, you should not have the procedure.  If you are taking any blood thinners, please inform your physician.  How should I prepare for this procedure?  Do not eat or drink anything at least six hours prior to the procedure.  Bring a driver with you .  It cannot be a taxi.  Come accompanied by an adult that can drive you back, and that is strong enough to help you if your legs get weak or numb from the local anesthetic.  Take all of your medicines the morning of the procedure with just enough water to swallow them.  If you have diabetes, make sure that you are scheduled to have your procedure done first thing in the morning, whenever possible.  If you have diabetes, take only half of your insulin dose and notify our nurse that you have done so as soon as you arrive at the clinic.  If you are diabetic, but only take blood sugar pills (oral hypoglycemic), then do not take them on the morning of your procedure.  You may take them after you have had the procedure.  Do not take aspirin or any aspirin-containing medications, at least eleven (11) days prior to the procedure.  They may prolong bleeding.  Wear loose fitting clothing that may be easy to take off and that you would not mind if it got stained with Betadine or blood.  Do not wear any jewelry or perfume  Remove any nail coloring.  It will interfere with some of our monitoring equipment.  NOTE: Remember that this is not meant to be interpreted as a complete list of all possible complications.  Unforeseen problems may occur.  BLOOD THINNERS The following drugs  contain aspirin or other products, which can cause increased bleeding during surgery and should not be taken for 2 weeks prior to and 1 week after surgery.  If you should need take something for relief of minor pain, you may take acetaminophen which is found in Tylenol,m Datril, Anacin-3 and Panadol. It is not blood thinner. The products listed below are.  Do not take any of the products listed below in addition to any listed on your instruction sheet.  A.P.C or A.P.C with Codeine Codeine Phosphate Capsules #3 Ibuprofen Ridaura  ABC compound Congesprin Imuran rimadil  Advil Cope Indocin Robaxisal  Alka-Seltzer Effervescent Pain Reliever and Antacid Coricidin or Coricidin-D  Indomethacin Rufen    Alka-Seltzer plus Cold Medicine Cosprin Ketoprofen S-A-C Tablets  Anacin Analgesic Tablets or Capsules Coumadin Korlgesic Salflex  Anacin Extra Strength Analgesic tablets or capsules CP-2 Tablets Lanoril Salicylate  Anaprox Cuprimine Capsules Levenox Salocol  Anexsia-D Dalteparin Magan Salsalate  Anodynos Darvon compound Magnesium Salicylate Sine-off  Ansaid Dasin Capsules Magsal Sodium Salicylate  Anturane Depen Capsules Marnal Soma  APF Arthritis pain formula Dewitt's Pills Measurin Stanback  Argesic Dia-Gesic Meclofenamic Sulfinpyrazone  Arthritis Bayer Timed Release Aspirin Diclofenac Meclomen Sulindac  Arthritis pain formula Anacin Dicumarol Medipren Supac  Analgesic (Safety coated) Arthralgen Diffunasal Mefanamic Suprofen  Arthritis Strength Bufferin Dihydrocodeine Mepro Compound Suprol  Arthropan liquid Dopirydamole Methcarbomol with Aspirin Synalgos  ASA tablets/Enseals Disalcid Micrainin Tagament  Ascriptin Doan's Midol Talwin  Ascriptin A/D Dolene Mobidin Tanderil  Ascriptin Extra Strength Dolobid Moblgesic Ticlid  Ascriptin with Codeine Doloprin or Doloprin with Codeine Momentum Tolectin  Asperbuf Duoprin Mono-gesic Trendar  Aspergum Duradyne Motrin or Motrin IB Triminicin  Aspirin  plain, buffered or enteric coated Durasal Myochrisine Trigesic  Aspirin Suppositories Easprin Nalfon Trillsate  Aspirin with Codeine Ecotrin Regular or Extra Strength Naprosyn Uracel  Atromid-S Efficin Naproxen Ursinus  Auranofin Capsules Elmiron Neocylate Vanquish  Axotal Emagrin Norgesic Verin  Azathioprine Empirin or Empirin with Codeine Normiflo Vitamin E  Azolid Emprazil Nuprin Voltaren  Bayer Aspirin plain, buffered or children's or timed BC Tablets or powders Encaprin Orgaran Warfarin Sodium  Buff-a-Comp Enoxaparin Orudis Zorpin  Buff-a-Comp with Codeine Equegesic Os-Cal-Gesic   Buffaprin Excedrin plain, buffered or Extra Strength Oxalid   Bufferin Arthritis Strength Feldene Oxphenbutazone   Bufferin plain or Extra Strength Feldene Capsules Oxycodone with Aspirin   Bufferin with Codeine Fenoprofen Fenoprofen Pabalate or Pabalate-SF   Buffets II Flogesic Panagesic   Buffinol plain or Extra Strength Florinal or Florinal with Codeine Panwarfarin   Buf-Tabs Flurbiprofen Penicillamine   Butalbital Compound Four-way cold tablets Penicillin   Butazolidin Fragmin Pepto-Bismol   Carbenicillin Geminisyn Percodan   Carna Arthritis Reliever Geopen Persantine   Carprofen Gold's salt Persistin   Chloramphenicol Goody's Phenylbutazone   Chloromycetin Haltrain Piroxlcam   Clmetidine heparin Plaquenil   Cllnoril Hyco-pap Ponstel   Clofibrate Hydroxy chloroquine Propoxyphen         Before stopping any of these medications, be sure to consult the physician who ordered them.  Some, such as Coumadin (Warfarin) are ordered to prevent or treat serious conditions such as "deep thrombosis", "pumonary embolisms", and other heart problems.  The amount of time that you may need off of the medication may also vary with the medication and the reason for which you were taking it.  If you are taking any of these medications, please make sure you notify your pain physician before you undergo any  procedures.         GENERAL RISKS AND COMPLICATIONS  What are the risk, side effects and possible complications? Generally speaking, most procedures are safe.  However, with any procedure there are risks, side effects, and the possibility of complications.  The risks and complications are dependent upon the sites that are lesioned, or the type of nerve block to be performed.  The closer the procedure is to the spine, the more serious the risks are.  Great care is taken when placing the radio frequency needles, block needles or lesioning probes, but sometimes complications can occur. 2. Infection: Any time there is an injection through the skin, there is a risk of infection.  This is why sterile conditions are used for these blocks.  There are   four possible types of infection. 1. Localized skin infection. 2. Central Nervous System Infection-This can be in the form of Meningitis, which can be deadly. 3. Epidural Infections-This can be in the form of an epidural abscess, which can cause pressure inside of the spine, causing compression of the spinal cord with subsequent paralysis. This would require an emergency surgery to decompress, and there are no guarantees that the patient would recover from the paralysis. 4. Discitis-This is an infection of the intervertebral discs.  It occurs in about 1% of discography procedures.  It is difficult to treat and it may lead to surgery.        2. Pain: the needles have to go through skin and soft tissues, will cause soreness.       3. Damage to internal structures:  The nerves to be lesioned may be near blood vessels or    other nerves which can be potentially damaged.       4. Bleeding: Bleeding is more common if the patient is taking blood thinners such as  aspirin, Coumadin, Ticiid, Plavix, etc., or if he/she have some genetic predisposition  such as hemophilia. Bleeding into the spinal canal can cause compression of the spinal  cord with subsequent  paralysis.  This would require an emergency surgery to  decompress and there are no guarantees that the patient would recover from the  paralysis.       5. Pneumothorax:  Puncturing of a lung is a possibility, every time a needle is introduced in  the area of the chest or upper back.  Pneumothorax refers to free air around the  collapsed lung(s), inside of the thoracic cavity (chest cavity).  Another two possible  complications related to a similar event would include: Hemothorax and Chylothorax.   These are variations of the Pneumothorax, where instead of air around the collapsed  lung(s), you may have blood or chyle, respectively.       6. Spinal headaches: They may occur with any procedures in the area of the spine.       7. Persistent CSF (Cerebro-Spinal Fluid) leakage: This is a rare problem, but may occur  with prolonged intrathecal or epidural catheters either due to the formation of a fistulous  track or a dural tear.       8. Nerve damage: By working so close to the spinal cord, there is always a possibility of  nerve damage, which could be as serious as a permanent spinal cord injury with  paralysis.       9. Death:  Although rare, severe deadly allergic reactions known as "Anaphylactic  reaction" can occur to any of the medications used.      10. Worsening of the symptoms:  We can always make thing worse.  What are the chances of something like this happening? Chances of any of this occuring are extremely low.  By statistics, you have more of a chance of getting killed in a motor vehicle accident: while driving to the hospital than any of the above occurring .  Nevertheless, you should be aware that they are possibilities.  In general, it is similar to taking a shower.  Everybody knows that you can slip, hit your head and get killed.  Does that mean that you should not shower again?  Nevertheless always keep in mind that statistics do not mean anything if you happen to be on the wrong side of  them.  Even if a procedure has a 1 (one)   in a 1,000,000 (million) chance of going wrong, it you happen to be that one..Also, keep in mind that by statistics, you have more of a chance of having something go wrong when taking medications.  Who should not have this procedure? If you are on a blood thinning medication (e.g. Coumadin, Plavix, see list of "Blood Thinners"), or if you have an active infection going on, you should not have the procedure.  If you are taking any blood thinners, please inform your physician.  How should I prepare for this procedure?  Do not eat or drink anything at least six hours prior to the procedure.  Bring a driver with you .  It cannot be a taxi.  Come accompanied by an adult that can drive you back, and that is strong enough to help you if your legs get weak or numb from the local anesthetic.  Take all of your medicines the morning of the procedure with just enough water to swallow them.  If you have diabetes, make sure that you are scheduled to have your procedure done first thing in the morning, whenever possible.  If you have diabetes, take only half of your insulin dose and notify our nurse that you have done so as soon as you arrive at the clinic.  If you are diabetic, but only take blood sugar pills (oral hypoglycemic), then do not take them on the morning of your procedure.  You may take them after you have had the procedure.  Do not take aspirin or any aspirin-containing medications, at least eleven (11) days prior to the procedure.  They may prolong bleeding.  Wear loose fitting clothing that may be easy to take off and that you would not mind if it got stained with Betadine or blood.  Do not wear any jewelry or perfume  Remove any nail coloring.  It will interfere with some of our monitoring equipment.  NOTE: Remember that this is not meant to be interpreted as a complete list of all possible complications.  Unforeseen problems may occur.  BLOOD  THINNERS The following drugs contain aspirin or other products, which can cause increased bleeding during surgery and should not be taken for 2 weeks prior to and 1 week after surgery.  If you should need take something for relief of minor pain, you may take acetaminophen which is found in Tylenol,m Datril, Anacin-3 and Panadol. It is not blood thinner. The products listed below are.  Do not take any of the products listed below in addition to any listed on your instruction sheet.  A.P.C or A.P.C with Codeine Codeine Phosphate Capsules #3 Ibuprofen Ridaura  ABC compound Congesprin Imuran rimadil  Advil Cope Indocin Robaxisal  Alka-Seltzer Effervescent Pain Reliever and Antacid Coricidin or Coricidin-D  Indomethacin Rufen  Alka-Seltzer plus Cold Medicine Cosprin Ketoprofen S-A-C Tablets  Anacin Analgesic Tablets or Capsules Coumadin Korlgesic Salflex  Anacin Extra Strength Analgesic tablets or capsules CP-2 Tablets Lanoril Salicylate  Anaprox Cuprimine Capsules Levenox Salocol  Anexsia-D Dalteparin Magan Salsalate  Anodynos Darvon compound Magnesium Salicylate Sine-off  Ansaid Dasin Capsules Magsal Sodium Salicylate  Anturane Depen Capsules Marnal Soma  APF Arthritis pain formula Dewitt's Pills Measurin Stanback  Argesic Dia-Gesic Meclofenamic Sulfinpyrazone  Arthritis Bayer Timed Release Aspirin Diclofenac Meclomen Sulindac  Arthritis pain formula Anacin Dicumarol Medipren Supac  Analgesic (Safety coated) Arthralgen Diffunasal Mefanamic Suprofen  Arthritis Strength Bufferin Dihydrocodeine Mepro Compound Suprol  Arthropan liquid Dopirydamole Methcarbomol with Aspirin Synalgos  ASA tablets/Enseals Disalcid Micrainin Tagament  Ascriptin   Doan's Midol Talwin  Ascriptin A/D Dolene Mobidin Tanderil  Ascriptin Extra Strength Dolobid Moblgesic Ticlid  Ascriptin with Codeine Doloprin or Doloprin with Codeine Momentum Tolectin  Asperbuf Duoprin Mono-gesic Trendar  Aspergum Duradyne Motrin or Motrin  IB Triminicin  Aspirin plain, buffered or enteric coated Durasal Myochrisine Trigesic  Aspirin Suppositories Easprin Nalfon Trillsate  Aspirin with Codeine Ecotrin Regular or Extra Strength Naprosyn Uracel  Atromid-S Efficin Naproxen Ursinus  Auranofin Capsules Elmiron Neocylate Vanquish  Axotal Emagrin Norgesic Verin  Azathioprine Empirin or Empirin with Codeine Normiflo Vitamin E  Azolid Emprazil Nuprin Voltaren  Bayer Aspirin plain, buffered or children's or timed BC Tablets or powders Encaprin Orgaran Warfarin Sodium  Buff-a-Comp Enoxaparin Orudis Zorpin  Buff-a-Comp with Codeine Equegesic Os-Cal-Gesic   Buffaprin Excedrin plain, buffered or Extra Strength Oxalid   Bufferin Arthritis Strength Feldene Oxphenbutazone   Bufferin plain or Extra Strength Feldene Capsules Oxycodone with Aspirin   Bufferin with Codeine Fenoprofen Fenoprofen Pabalate or Pabalate-SF   Buffets II Flogesic Panagesic   Buffinol plain or Extra Strength Florinal or Florinal with Codeine Panwarfarin   Buf-Tabs Flurbiprofen Penicillamine   Butalbital Compound Four-way cold tablets Penicillin   Butazolidin Fragmin Pepto-Bismol   Carbenicillin Geminisyn Percodan   Carna Arthritis Reliever Geopen Persantine   Carprofen Gold's salt Persistin   Chloramphenicol Goody's Phenylbutazone   Chloromycetin Haltrain Piroxlcam   Clmetidine heparin Plaquenil   Cllnoril Hyco-pap Ponstel   Clofibrate Hydroxy chloroquine Propoxyphen         Before stopping any of these medications, be sure to consult the physician who ordered them.  Some, such as Coumadin (Warfarin) are ordered to prevent or treat serious conditions such as "deep thrombosis", "pumonary embolisms", and other heart problems.  The amount of time that you may need off of the medication may also vary with the medication and the reason for which you were taking it.  If you are taking any of these medications, please make sure you notify your pain physician before you  undergo any procedures.          

## 2016-12-30 NOTE — Progress Notes (Signed)
Safety precautions to be maintained throughout the outpatient stay will include: orient to surroundings, keep bed in low position, maintain call bell within reach at all times, provide assistance with transfer out of bed and ambulation.  

## 2016-12-30 NOTE — Progress Notes (Signed)
Patient's Name: Cynthia Jones  MRN: [id number]  Referring Provider: Guadalupe Maple, MD  DOB: 06/30/1941  PCP: Cynthia Maple, MD  DOS: 12/30/2016  Note by: Kathlen Brunswick. Dossie Arbour, MD  Service setting: Ambulatory outpatient  Specialty: Interventional Pain Management  Location: [facility] (AMB) Pain Management Facility    Patient type: Established   Primary Reason(s) for Visit: Encounter for post-procedure evaluation of chronic illness with mild to moderate exacerbation CC: Knee Pain (bilateral)  HPI  [name] is a 76 y.o. year old, female patient, who comes today for a post-procedure evaluation. She has Osteopenia; Depression; Essential hypertension; Diabetes mellitus ([place]); Degenerative joint disease of knee; Osteoarthritis; Seasonal allergic rhinitis; Obesity; Intertrigo; Fibromyalgia; Bilateral occipital neuralgia; Greater trochanteric bursitis; Osteoarthritis of knee (Location of Primary Source of Pain) (Left); Sacroiliac joint disease; Lumbar facet syndrome (Bilateral) (L>R); Neuropathy due to secondary diabetes ([place]); S/P TKR Arthroplasty (Right); Folliculitis; Insomnia; Long term current use of opiate analgesic; Long term prescription opiate use; Opiate use; Long term prescription benzodiazepine use; Chronic pain syndrome; Chronic low back pain (Location of Secondary source of pain) (Bilateral) (L>R); Chronic knee pain (Location of Primary Source of Pain) (Bilateral) (L>R); Cervicogenic headache; Chronic hip pain (Bilateral); Chronic hand pain (Bilateral) (R>L); Chronic foot pain (Left); Hypercholesteremia; and Lumbar spondylosis on her problem list. Her primarily concern today is the Knee Pain (bilateral)  Pain Assessment: Self-Reported Pain Score: 2 /10             Reported level is compatible with observation.       Pain Type: Chronic pain Pain Location: Knee Pain Orientation: Right, Left Pain Descriptors / Indicators: Aching Pain Frequency: Constant  [name] comes in today for  post-procedure evaluation after the treatment done on 12/16/2016. The patient appears to be very happy about the fact that her knee pain has improved as much as it did. At this point we do not need to do any further injections, until the pain returns.  Further details on both, my assessment(s), as well as the proposed treatment plan, please see below.  Post-Procedure Assessment  12/16/2016 Procedure: Right Genicular nerve block & Left intra-articular Hyalgan knee injection #1 Pre-procedure pain score:  4/10 Post-procedure pain score: 0/10 (100% relief) Influential Factors: BMI: 42.97 kg/m Intra-procedural challenges: None observed Assessment challenges: None detected         Post-procedural side-effects, adverse reactions, or complications: None reported Reported issues: None  Sedation: No sedation used. When no sedatives are used, the analgesic levels obtained are directly associated to the effectiveness of the local anesthetics. However, when sedation is provided, the level of analgesia obtained during the initial 1 hour following the intervention, is believed to be the result of a combination of factors. These factors may include, but are not limited to: 1. The effectiveness of the local anesthetics used. 2. The effects of the analgesic(s) and/or anxiolytic(s) used. 3. The degree of discomfort experienced by the patient at the time of the procedure. 4. The patients ability and reliability in recalling and recording the events. 5. The presence and influence of possible secondary gains and/or psychosocial factors. Reported result: Relief experienced during the 1st hour after the procedure:  (left knee 100%, right knee 90%) (Ultra-Short Term Relief) Interpretative annotation: No Analgesic or Anxiolytic given, therefore benefits are completely due to Local Anesthetics.          Effects of local anesthetic: The analgesic effects attained during this period are directly associated to the  localized infiltration of local anesthetics and  therefore cary significant diagnostic value as to the etiological location, or anatomical origin, of the pain. Expected duration of relief is directly dependent on the pharmacodynamics of the local anesthetic used. Long-acting (4-6 hours) anesthetics used.  Reported result: Relief during the next 4 to 6 hour after the procedure:  (left knee 100%, right knee 90%) (Short-Term Relief) Interpretative annotation: Complete relief would suggest area to be the source of the pain.          Long-term benefit: Defined as the period of time past the expected duration of local anesthetics. With the possible exception of prolonged sympathetic blockade from the local anesthetics, benefits during this period are typically attributed to, or associated with, other factors such as analgesic sensory neuropraxia, antiinflammatory effects, or beneficial biochemical changes provided by agents other than the local anesthetics Reported result: Extended relief following procedure:  (left knee 90%, right knee 50% ongoing) (Long-Term Relief) Interpretative annotation: Good relief. This could suggest inflammation to be a significant component in the etiology to the pain.          Current benefits: Defined as persistent relief that continues at this point in time.   Reported results: Treated area: 90 % on the left and 50% on the right       Interpretative annotation: Ongoing benefits would suggest effective therapeutic approach  Interpretation: Results would suggest a successful diagnostic intervention.          Laboratory Chemistry  Inflammation Markers Lab Results  Component Value Date   CRP 1.1 (H) 09/24/2016   ESRSEDRATE 44 (H) 09/24/2016   (CRP: Acute Phase) (ESR: Chronic Phase) Renal Function Markers Lab Results  Component Value Date   BUN 10 12/02/2016   CREATININE 0.64 12/02/2016   GFRAA 102 12/02/2016   GFRNONAA 88 12/02/2016   Hepatic Function Markers Lab  Results  Component Value Date   AST 28 12/02/2016   ALT 13 12/02/2016   ALBUMIN 3.8 04/08/2016   ALKPHOS 50 04/08/2016   Electrolytes Lab Results  Component Value Date   NA 138 12/02/2016   K 4.4 12/02/2016   CL 98 12/02/2016   CALCIUM 9.3 12/02/2016   MG 2.1 09/24/2016   Neuropathy Markers Lab Results  Component Value Date   VITAMINB12 344 09/24/2016   Bone Pathology Markers Lab Results  Component Value Date   ALKPHOS 50 04/08/2016   25OHVITD1 20 (L) 09/24/2016   25OHVITD2 <1.0 09/24/2016   25OHVITD3 19 09/24/2016   CALCIUM 9.3 12/02/2016   Coagulation Parameters Lab Results  Component Value Date   PLT 314 04/08/2016   Cardiovascular Markers Lab Results  Component Value Date   HCT 39.6 04/08/2016   Note: Lab results reviewed.  Recent Diagnostic Imaging Review  Dg C-arm 1-60 Min-no Report  Result Date: 12/16/2016 Fluoroscopy was utilized by the requesting physician.  No radiographic interpretation.   Note: Imaging results reviewed.          Meds  The patient has a current medication list which includes the following prescription(s): alprazolam, carvedilol, chlorhexidine, duloxetine, esomeprazole, glucose blood, losartan, oxycodone, oxycodone, vitamin d (ergocalciferol), and oxycodone.  Current Outpatient Prescriptions on File Prior to Visit  Medication Sig  . ALPRAZolam (XANAX) 0.25 MG tablet Take 1 tablet (0.25 mg total) by mouth 2 (two) times daily as needed for anxiety.  . carvedilol (COREG) 12.5 MG tablet Take 1 tablet (12.5 mg total) by mouth 2 (two) times daily with a meal.  . chlorhexidine (HIBICLENS) 4 % external liquid Apply topically daily as  needed.  . DULoxetine (CYMBALTA) 60 MG capsule Take 1 capsule (60 mg total) by mouth daily.  [name] esomeprazole (NEXIUM) 40 MG capsule Take 40 mg by mouth daily at 12 noon.  [name] glucose blood (IGLUCOSE TEST STRIPS) test strip Use as instructed - freestyle insulin x strip  . losartan (COZAAR) 100 MG tablet Take 1  tablet (100 mg total) by mouth daily.  [name] oxyCODONE (OXY IR/ROXICODONE) 5 MG immediate release tablet Take 1 tablet (5 mg total) by mouth 2 (two) times daily.  Derrill Memo ON 01/22/2017] oxyCODONE (OXY IR/ROXICODONE) 5 MG immediate release tablet Take 1 tablet (5 mg total) by mouth 2 (two) times daily.  . Vitamin D, Ergocalciferol, (DRISDOL) 50000 units CAPS capsule Take 1 capsule (50,000 Units total) by mouth every 7 (seven) days.  [name] oxyCODONE (OXY IR/ROXICODONE) 5 MG immediate release tablet Take 1 tablet (5 mg total) by mouth 2 (two) times daily.   No current facility-administered medications on file prior to visit.    ROS  Constitutional: Denies any fever or chills Gastrointestinal: No reported hemesis, hematochezia, vomiting, or acute GI distress Musculoskeletal: Denies any acute onset joint swelling, redness, loss of ROM, or weakness Neurological: No reported episodes of acute onset apraxia, aphasia, dysarthria, agnosia, amnesia, paralysis, loss of coordination, or loss of consciousness  Allergies  Ms. Defeo is allergic to nsaids and erythromycin.  [place]  Drug: Ms. Wickstrom  reports that she does not use drugs. Alcohol:  reports that she does not drink alcohol. Tobacco:  reports that she has never smoked. She has never used smokeless tobacco. Medical:  has a past medical history of Acute anxiety (03/06/2015); Allergy; Anxiety; Degenerative joint disease of knee; Depression; Fibromyalgia; Obesity; Osteopenia; and RAD (reactive airway disease). Family: family history includes Alzheimer's disease in her father; Cancer in her sister; Diabetes in her mother; Heart disease in her mother; Parkinson's disease in her father; Stroke in her mother.  Past Surgical History:  Procedure Laterality Date  . ABDOMINAL HYSTERECTOMY    . APPENDECTOMY    . CHOLECYSTECTOMY    . EYE SURGERY    . JOINT REPLACEMENT  2014   right knee  . PAROTIDECTOMY Left   . TONSILLECTOMY     Constitutional Exam   General appearance: Well nourished, well developed, and well hydrated. In no apparent acute distress Vitals:   12/30/16 1114 12/30/16 1117  BP: (!) 142/63 (!) 142/63  Pulse: 73   Resp: 18   Temp: 98.4 F (36.9 C)   SpO2: 98%   Weight: 220 lb (99.8 kg)   Height: 5' (1.524 m)    BMI Assessment: Estimated body mass index is 42.97 kg/m as calculated from the following:   Height as of this encounter: 5' (1.524 m).   Weight as of this encounter: 220 lb (99.8 kg).  BMI interpretation table: BMI level Category Range association with higher incidence of chronic pain  <18 kg/m2 Underweight   18.5-24.9 kg/m2 Ideal body weight   25-29.9 kg/m2 Overweight Increased incidence by 20%  30-34.9 kg/m2 Obese (Class I) Increased incidence by 68%  35-39.9 kg/m2 Severe obesity (Class II) Increased incidence by 136%  >40 kg/m2 Extreme obesity (Class III) Increased incidence by 254%   BMI Readings from Last 4 Encounters:  12/30/16 42.97 kg/m  12/16/16 42.97 kg/m  12/02/16 43.94 kg/m  11/19/16 43.36 kg/m   Wt Readings from Last 4 Encounters:  12/30/16 220 lb (99.8 kg)  12/16/16 220 lb (99.8 kg)  12/02/16 225 lb (102.1 kg)  11/19/16  222 lb (100.7 kg)  Psych/Mental status: Alert, oriented x 3 (person, place, & time)       Eyes: PERLA Respiratory: No evidence of acute respiratory distress  Cervical Spine Exam  Inspection: No masses, redness, or swelling Alignment: Symmetrical Functional ROM: Unrestricted ROM      Stability: No instability detected Muscle strength & Tone: Functionally intact Sensory: Unimpaired Palpation: No palpable anomalies              Upper Extremity (UE) Exam    Side: Right upper extremity  Side: Left upper extremity  Inspection: No masses, redness, swelling, or asymmetry. No contractures  Inspection: No masses, redness, swelling, or asymmetry. No contractures  Functional ROM: Unrestricted ROM          Functional ROM: Unrestricted ROM          Muscle strength &  Tone: Functionally intact  Muscle strength & Tone: Functionally intact  Sensory: Unimpaired  Sensory: Unimpaired  Palpation: No palpable anomalies              Palpation: No palpable anomalies              Specialized Test(s): Deferred         Specialized Test(s): Deferred          Thoracic Spine Exam  Inspection: No masses, redness, or swelling Alignment: Symmetrical Functional ROM: Unrestricted ROM Stability: No instability detected Sensory: Unimpaired Muscle strength & Tone: No palpable anomalies  Lumbar Spine Exam  Inspection: No masses, redness, or swelling Alignment: Symmetrical Functional ROM: Unrestricted ROM      Stability: No instability detected Muscle strength & Tone: Functionally intact Sensory: Unimpaired Palpation: No palpable anomalies       Provocative Tests: Lumbar Hyperextension and rotation test: evaluation deferred today       Patrick's Maneuver: evaluation deferred today                    Gait & Posture Assessment  Ambulation: Unassisted Gait: Relatively normal for age and body habitus Posture: WNL   Lower Extremity Exam    Side: Right lower extremity  Side: Left lower extremity  Inspection: No masses, redness, swelling, or asymmetry. No contractures  Inspection: No masses, redness, swelling, or asymmetry. No contractures  Functional ROM: Unrestricted ROM          Functional ROM: Improved after treatment          Muscle strength & Tone: Functionally intact  Muscle strength & Tone: Functionally intact  Sensory: Improved  Sensory: Improved  Palpation: No palpable anomalies  Palpation: No palpable anomalies   Assessment  Primary Diagnosis & Pertinent Problem List: The primary encounter diagnosis was Chronic knee pain (Location of Primary Source of Pain) (Bilateral) (L>R). Diagnoses of Chronic low back pain (Location of Secondary source of pain) (Bilateral) (L>R), Osteoarthritis of knee (Location of Primary Source of Pain) (Left), S/P TKR Arthroplasty  (Right), and Chronic pain syndrome were also pertinent to this visit.  Status Diagnosis  Improved Controlled Controlled 1. Chronic knee pain (Location of Primary Source of Pain) (Bilateral) (L>R)   2. Chronic low back pain (Location of Secondary source of pain) (Bilateral) (L>R)   3. Osteoarthritis of knee (Location of Primary Source of Pain) (Left)   4. S/P TKR Arthroplasty (Right)   5. Chronic pain syndrome      Plan of Care  Pharmacotherapy (Medications Ordered): No orders of the defined types were placed in this encounter.  New Prescriptions   No  medications on file   Medications administered today: Ms. Kenedy had no medications administered during this visit. Lab-work, procedure(s), and/or referral(s): Orders Placed This Encounter  Procedures  . KNEE INJECTION  . GENICULAR NERVE BLOCK   Imaging and/or referral(s): None  Interventional therapies: Planned, scheduled, and/or pending:   Not at this time.   Considering:   Right Genicular nerve block & Left intra-articular Hyalgan knee injection #2 Diagnostic right Genicular nerve block  Possible right Genicular nerve radiofrequency ablation  Diagnostic left intra-articular knee joint injection  Possible series of 5 left intra-articular Hyalgan knee injections  Diagnostic left Genicular nerve block  Possible left Genicular nerve RFA  Diagnostic bilateral lumbar facet block  Possible bilateral lumbar facet RFA  Diagnostic left lumbar epidural steroid injection  Diagnostic caudal epidural steroid injection + epidurogram  Possible Racz procedure  Diagnostic bilateral cervical facet block  Possible bilateral cervical facet RFA  Diagnostic right versus left cervical epidural steroid injection  Diagnostic bilateral intra-articular shoulder joint injection  Diagnostic bilateral suprascapular nerve block  Possible bilateral suprascapular nerve RFA    Palliative PRN treatment(s):   Right Genicular nerve block & Left  intra-articular Hyalgan knee injection #2 Diagnostic bilateral Genicular nerve block  Diagnostic left intra-articular knee joint injection  Diagnostic bilateral lumbar facet block  Diagnostic left lumbar epidural steroid injection  Diagnostic caudal epidural steroid injection + epidurogram  Diagnostic bilateral cervical facet block  Diagnostic right versus left cervical epidural steroid injection  Diagnostic bilateral intra-articular shoulder joint injection  Diagnostic bilateral suprascapular nerve block    Provider-requested follow-up: Return for PRN procedure(s), by MD.  Future Appointments Date Time Provider [place]  02/08/2017 8:40 AM Vevelyn Francois, NP [facility] None  04/05/2017 10:00 AM Cynthia Maple, MD [facility] None   Primary Care Physician: Cynthia Maple, MD Location: [facility] Note by: Kathlen Brunswick. Dossie Arbour, M.D, DABA, DABAPM, DABPM, DABIPP, FIPP Date: 12/30/2016; Time: 11:38 AM  Patient instructions provided during this appointment: Patient Instructions   GENERAL RISKS AND COMPLICATIONS  What are the risk, side effects and possible complications? Generally speaking, most procedures are safe.  However, with any procedure there are risks, side effects, and the possibility of complications.  The risks and complications are dependent upon the sites that are lesioned, or the type of nerve block to be performed.  The closer the procedure is to the spine, the more serious the risks are.  Great care is taken when placing the radio frequency needles, block needles or lesioning probes, but sometimes complications can occur. 1. Infection: Any time there is an injection through the skin, there is a risk of infection.  This is why sterile conditions are used for these blocks.  There are four possible types of infection. 1. Localized skin infection. 2. Central Nervous System Infection-This can be in the form of Meningitis, which can be  deadly. 3. Epidural Infections-This can be in the form of an epidural abscess, which can cause pressure inside of the spine, causing compression of the spinal cord with subsequent paralysis. This would require an emergency surgery to decompress, and there are no guarantees that the patient would recover from the paralysis. 4. Discitis-This is an infection of the intervertebral discs.  It occurs in about 1% of discography procedures.  It is difficult to treat and it may lead to surgery.        2. Pain: the needles have to go through skin and soft tissues, will cause soreness.  3. Damage to internal structures:  The nerves to be lesioned may be near blood vessels or    other nerves which can be potentially damaged.       4. Bleeding: Bleeding is more common if the patient is taking blood thinners such as  aspirin, Coumadin, Ticiid, Plavix, etc., or if he/she have some genetic predisposition  such as hemophilia. Bleeding into the spinal canal can cause compression of the spinal  cord with subsequent paralysis.  This would require an emergency surgery to  decompress and there are no guarantees that the patient would recover from the  paralysis.       5. Pneumothorax:  Puncturing of a lung is a possibility, every time a needle is introduced in  the area of the chest or upper back.  Pneumothorax refers to free air around the  collapsed lung(s), inside of the thoracic cavity (chest cavity).  Another two possible  complications related to a similar event would include: Hemothorax and Chylothorax.   These are variations of the Pneumothorax, where instead of air around the collapsed  lung(s), you may have blood or chyle, respectively.       6. Spinal headaches: They may occur with any procedures in the area of the spine.       7. Persistent CSF (Cerebro-Spinal Fluid) leakage: This is a rare problem, but may occur  with prolonged intrathecal or epidural catheters either due to the formation of a fistulous  track  or a dural tear.       8. Nerve damage: By working so close to the spinal cord, there is always a possibility of  nerve damage, which could be as serious as a permanent spinal cord injury with  paralysis.       9. Death:  Although rare, severe deadly allergic reactions known as "Anaphylactic  reaction" can occur to any of the medications used.      10. Worsening of the symptoms:  We can always make thing worse.  What are the chances of something like this happening? Chances of any of this occuring are extremely low.  By statistics, you have more of a chance of getting killed in a motor vehicle accident: while driving to the hospital than any of the above occurring .  Nevertheless, you should be aware that they are possibilities.  In general, it is similar to taking a shower.  Everybody knows that you can slip, hit your head and get killed.  Does that mean that you should not shower again?  Nevertheless always keep in mind that statistics do not mean anything if you happen to be on the wrong side of them.  Even if a procedure has a 1 (one) in a 1,000,000 (million) chance of going wrong, it you happen to be that one..Also, keep in mind that by statistics, you have more of a chance of having something go wrong when taking medications.  Who should not have this procedure? If you are on a blood thinning medication (e.g. Coumadin, Plavix, see list of "Blood Thinners"), or if you have an active infection going on, you should not have the procedure.  If you are taking any blood thinners, please inform your physician.  How should I prepare for this procedure?  Do not eat or drink anything at least six hours prior to the procedure.  Bring a driver with you .  It cannot be a taxi.  Come accompanied by an adult that can drive you back, and that  is strong enough to help you if your legs get weak or numb from the local anesthetic.  Take all of your medicines the morning of the procedure with just enough water  to swallow them.  If you have diabetes, make sure that you are scheduled to have your procedure done first thing in the morning, whenever possible.  If you have diabetes, take only half of your insulin dose and notify our nurse that you have done so as soon as you arrive at the clinic.  If you are diabetic, but only take blood sugar pills (oral hypoglycemic), then do not take them on the morning of your procedure.  You may take them after you have had the procedure.  Do not take aspirin or any aspirin-containing medications, at least eleven (11) days prior to the procedure.  They may prolong bleeding.  Wear loose fitting clothing that may be easy to take off and that you would not mind if it got stained with Betadine or blood.  Do not wear any jewelry or perfume  Remove any nail coloring.  It will interfere with some of our monitoring equipment.  NOTE: Remember that this is not meant to be interpreted as a complete list of all possible complications.  Unforeseen problems may occur.  BLOOD THINNERS The following drugs contain aspirin or other products, which can cause increased bleeding during surgery and should not be taken for 2 weeks prior to and 1 week after surgery.  If you should need take something for relief of minor pain, you may take acetaminophen which is found in Tylenol,m Datril, Anacin-3 and Panadol. It is not blood thinner. The products listed below are.  Do not take any of the products listed below in addition to any listed on your instruction sheet.  A.P.C or A.P.C with Codeine Codeine Phosphate Capsules #3 Ibuprofen Ridaura  ABC compound Congesprin Imuran rimadil  Advil Cope Indocin Robaxisal  Alka-Seltzer Effervescent Pain Reliever and Antacid Coricidin or Coricidin-D  Indomethacin Rufen  Alka-Seltzer plus Cold Medicine Cosprin Ketoprofen S-A-C Tablets  Anacin Analgesic Tablets or Capsules Coumadin Korlgesic Salflex  Anacin Extra Strength Analgesic tablets or capsules  CP-2 Tablets Lanoril Salicylate  Anaprox Cuprimine Capsules Levenox Salocol  Anexsia-D Dalteparin Magan Salsalate  Anodynos Darvon compound Magnesium Salicylate Sine-off  Ansaid Dasin Capsules Magsal Sodium Salicylate  Anturane Depen Capsules Marnal Soma  APF Arthritis pain formula Dewitt's Pills Measurin Stanback  Argesic Dia-Gesic Meclofenamic Sulfinpyrazone  Arthritis Bayer Timed Release Aspirin Diclofenac Meclomen Sulindac  Arthritis pain formula Anacin Dicumarol Medipren Supac  Analgesic (Safety coated) Arthralgen Diffunasal Mefanamic Suprofen  Arthritis Strength Bufferin Dihydrocodeine Mepro Compound Suprol  Arthropan liquid Dopirydamole Methcarbomol with Aspirin Synalgos  ASA tablets/Enseals Disalcid Micrainin Tagament  Ascriptin Doan's Midol Talwin  Ascriptin A/D Dolene Mobidin Tanderil  Ascriptin Extra Strength Dolobid Moblgesic Ticlid  Ascriptin with Codeine Doloprin or Doloprin with Codeine Momentum Tolectin  Asperbuf Duoprin Mono-gesic Trendar  Aspergum Duradyne Motrin or Motrin IB Triminicin  Aspirin plain, buffered or enteric coated Durasal Myochrisine Trigesic  Aspirin Suppositories Easprin Nalfon Trillsate  Aspirin with Codeine Ecotrin Regular or Extra Strength Naprosyn Uracel  Atromid-S Efficin Naproxen Ursinus  Auranofin Capsules Elmiron Neocylate Vanquish  Axotal Emagrin Norgesic Verin  Azathioprine Empirin or Empirin with Codeine Normiflo Vitamin E  Azolid Emprazil Nuprin Voltaren  Bayer Aspirin plain, buffered or children's or timed BC Tablets or powders Encaprin Orgaran Warfarin Sodium  Buff-a-Comp Enoxaparin Orudis Zorpin  Buff-a-Comp with Codeine Equegesic Os-Cal-Gesic   Buffaprin Excedrin plain, buffered or  Extra Strength Oxalid   Bufferin Arthritis Strength Feldene Oxphenbutazone   Bufferin plain or Extra Strength Feldene Capsules Oxycodone with Aspirin   Bufferin with Codeine Fenoprofen Fenoprofen Pabalate or Pabalate-SF   Buffets II Flogesic Panagesic    Buffinol plain or Extra Strength Florinal or Florinal with Codeine Panwarfarin   Buf-Tabs Flurbiprofen Penicillamine   Butalbital Compound Four-way cold tablets Penicillin   Butazolidin Fragmin Pepto-Bismol   Carbenicillin Geminisyn Percodan   Carna Arthritis Reliever Geopen Persantine   Carprofen Gold's salt Persistin   Chloramphenicol Goody's Phenylbutazone   Chloromycetin Haltrain Piroxlcam   Clmetidine heparin Plaquenil   Cllnoril Hyco-pap Ponstel   Clofibrate Hydroxy chloroquine Propoxyphen         Before stopping any of these medications, be sure to consult the physician who ordered them.  Some, such as Coumadin (Warfarin) are ordered to prevent or treat serious conditions such as "deep thrombosis", "pumonary embolisms", and other heart problems.  The amount of time that you may need off of the medication may also vary with the medication and the reason for which you were taking it.  If you are taking any of these medications, please make sure you notify your pain physician before you undergo any procedures.         GENERAL RISKS AND COMPLICATIONS  What are the risk, side effects and possible complications? Generally speaking, most procedures are safe.  However, with any procedure there are risks, side effects, and the possibility of complications.  The risks and complications are dependent upon the sites that are lesioned, or the type of nerve block to be performed.  The closer the procedure is to the spine, the more serious the risks are.  Great care is taken when placing the radio frequency needles, block needles or lesioning probes, but sometimes complications can occur. 2. Infection: Any time there is an injection through the skin, there is a risk of infection.  This is why sterile conditions are used for these blocks.  There are four possible types of infection. 1. Localized skin infection. 2. Central Nervous System Infection-This can be in the form of Meningitis, which can  be deadly. 3. Epidural Infections-This can be in the form of an epidural abscess, which can cause pressure inside of the spine, causing compression of the spinal cord with subsequent paralysis. This would require an emergency surgery to decompress, and there are no guarantees that the patient would recover from the paralysis. 4. Discitis-This is an infection of the intervertebral discs.  It occurs in about 1% of discography procedures.  It is difficult to treat and it may lead to surgery.        2. Pain: the needles have to go through skin and soft tissues, will cause soreness.       3. Damage to internal structures:  The nerves to be lesioned may be near blood vessels or    other nerves which can be potentially damaged.       4. Bleeding: Bleeding is more common if the patient is taking blood thinners such as  aspirin, Coumadin, Ticiid, Plavix, etc., or if he/she have some genetic predisposition  such as hemophilia. Bleeding into the spinal canal can cause compression of the spinal  cord with subsequent paralysis.  This would require an emergency surgery to  decompress and there are no guarantees that the patient would recover from the  paralysis.       5. Pneumothorax:  Puncturing of a lung is a possibility, every time  a needle is introduced in  the area of the chest or upper back.  Pneumothorax refers to free air around the  collapsed lung(s), inside of the thoracic cavity (chest cavity).  Another two possible  complications related to a similar event would include: Hemothorax and Chylothorax.   These are variations of the Pneumothorax, where instead of air around the collapsed  lung(s), you may have blood or chyle, respectively.       6. Spinal headaches: They may occur with any procedures in the area of the spine.       7. Persistent CSF (Cerebro-Spinal Fluid) leakage: This is a rare problem, but may occur  with prolonged intrathecal or epidural catheters either due to the formation of a fistulous   track or a dural tear.       8. Nerve damage: By working so close to the spinal cord, there is always a possibility of  nerve damage, which could be as serious as a permanent spinal cord injury with  paralysis.       9. Death:  Although rare, severe deadly allergic reactions known as "Anaphylactic  reaction" can occur to any of the medications used.      10. Worsening of the symptoms:  We can always make thing worse.  What are the chances of something like this happening? Chances of any of this occuring are extremely low.  By statistics, you have more of a chance of getting killed in a motor vehicle accident: while driving to the hospital than any of the above occurring .  Nevertheless, you should be aware that they are possibilities.  In general, it is similar to taking a shower.  Everybody knows that you can slip, hit your head and get killed.  Does that mean that you should not shower again?  Nevertheless always keep in mind that statistics do not mean anything if you happen to be on the wrong side of them.  Even if a procedure has a 1 (one) in a 1,000,000 (million) chance of going wrong, it you happen to be that one..Also, keep in mind that by statistics, you have more of a chance of having something go wrong when taking medications.  Who should not have this procedure? If you are on a blood thinning medication (e.g. Coumadin, Plavix, see list of "Blood Thinners"), or if you have an active infection going on, you should not have the procedure.  If you are taking any blood thinners, please inform your physician.  How should I prepare for this procedure?  Do not eat or drink anything at least six hours prior to the procedure.  Bring a driver with you .  It cannot be a taxi.  Come accompanied by an adult that can drive you back, and that is strong enough to help you if your legs get weak or numb from the local anesthetic.  Take all of your medicines the morning of the procedure with just enough  water to swallow them.  If you have diabetes, make sure that you are scheduled to have your procedure done first thing in the morning, whenever possible.  If you have diabetes, take only half of your insulin dose and notify our nurse that you have done so as soon as you arrive at the clinic.  If you are diabetic, but only take blood sugar pills (oral hypoglycemic), then do not take them on the morning of your procedure.  You may take them after you have had the procedure.  Do not take aspirin or any aspirin-containing medications, at least eleven (11) days prior to the procedure.  They may prolong bleeding.  Wear loose fitting clothing that may be easy to take off and that you would not mind if it got stained with Betadine or blood.  Do not wear any jewelry or perfume  Remove any nail coloring.  It will interfere with some of our monitoring equipment.  NOTE: Remember that this is not meant to be interpreted as a complete list of all possible complications.  Unforeseen problems may occur.  BLOOD THINNERS The following drugs contain aspirin or other products, which can cause increased bleeding during surgery and should not be taken for 2 weeks prior to and 1 week after surgery.  If you should need take something for relief of minor pain, you may take acetaminophen which is found in Tylenol,m Datril, Anacin-3 and Panadol. It is not blood thinner. The products listed below are.  Do not take any of the products listed below in addition to any listed on your instruction sheet.  A.P.C or A.P.C with Codeine Codeine Phosphate Capsules #3 Ibuprofen Ridaura  ABC compound Congesprin Imuran rimadil  Advil Cope Indocin Robaxisal  Alka-Seltzer Effervescent Pain Reliever and Antacid Coricidin or Coricidin-D  Indomethacin Rufen  Alka-Seltzer plus Cold Medicine Cosprin Ketoprofen S-A-C Tablets  Anacin Analgesic Tablets or Capsules Coumadin Korlgesic Salflex  Anacin Extra Strength Analgesic tablets or  capsules CP-2 Tablets Lanoril Salicylate  Anaprox Cuprimine Capsules Levenox Salocol  Anexsia-D Dalteparin Magan Salsalate  Anodynos Darvon compound Magnesium Salicylate Sine-off  Ansaid Dasin Capsules Magsal Sodium Salicylate  Anturane Depen Capsules Marnal Soma  APF Arthritis pain formula Dewitt's Pills Measurin Stanback  Argesic Dia-Gesic Meclofenamic Sulfinpyrazone  Arthritis Bayer Timed Release Aspirin Diclofenac Meclomen Sulindac  Arthritis pain formula Anacin Dicumarol Medipren Supac  Analgesic (Safety coated) Arthralgen Diffunasal Mefanamic Suprofen  Arthritis Strength Bufferin Dihydrocodeine Mepro Compound Suprol  Arthropan liquid Dopirydamole Methcarbomol with Aspirin Synalgos  ASA tablets/Enseals Disalcid Micrainin Tagament  Ascriptin Doan's Midol Talwin  Ascriptin A/D Dolene Mobidin Tanderil  Ascriptin Extra Strength Dolobid Moblgesic Ticlid  Ascriptin with Codeine Doloprin or Doloprin with Codeine Momentum Tolectin  Asperbuf Duoprin Mono-gesic Trendar  Aspergum Duradyne Motrin or Motrin IB Triminicin  Aspirin plain, buffered or enteric coated Durasal Myochrisine Trigesic  Aspirin Suppositories Easprin Nalfon Trillsate  Aspirin with Codeine Ecotrin Regular or Extra Strength Naprosyn Uracel  Atromid-S Efficin Naproxen Ursinus  Auranofin Capsules Elmiron Neocylate Vanquish  Axotal Emagrin Norgesic Verin  Azathioprine Empirin or Empirin with Codeine Normiflo Vitamin E  Azolid Emprazil Nuprin Voltaren  Bayer Aspirin plain, buffered or children's or timed BC Tablets or powders Encaprin Orgaran Warfarin Sodium  Buff-a-Comp Enoxaparin Orudis Zorpin  Buff-a-Comp with Codeine Equegesic Os-Cal-Gesic   Buffaprin Excedrin plain, buffered or Extra Strength Oxalid   Bufferin Arthritis Strength Feldene Oxphenbutazone   Bufferin plain or Extra Strength Feldene Capsules Oxycodone with Aspirin   Bufferin with Codeine Fenoprofen Fenoprofen Pabalate or Pabalate-SF   Buffets II Flogesic  Panagesic   Buffinol plain or Extra Strength Florinal or Florinal with Codeine Panwarfarin   Buf-Tabs Flurbiprofen Penicillamine   Butalbital Compound Four-way cold tablets Penicillin   Butazolidin Fragmin Pepto-Bismol   Carbenicillin Geminisyn Percodan   Carna Arthritis Reliever Geopen Persantine   Carprofen Gold's salt Persistin   Chloramphenicol Goody's Phenylbutazone   Chloromycetin Haltrain Piroxlcam   Clmetidine heparin Plaquenil   Cllnoril Hyco-pap Ponstel   Clofibrate Hydroxy chloroquine Propoxyphen         Before stopping  any of these medications, be sure to consult the physician who ordered them.  Some, such as Coumadin (Warfarin) are ordered to prevent or treat serious conditions such as "deep thrombosis", "pumonary embolisms", and other heart problems.  The amount of time that you may need off of the medication may also vary with the medication and the reason for which you were taking it.  If you are taking any of these medications, please make sure you notify your pain physician before you undergo any procedures.

## 2017-01-12 ENCOUNTER — Other Ambulatory Visit: Payer: Self-pay | Admitting: Family Medicine

## 2017-01-26 NOTE — Progress Notes (Deleted)
Patient's Name: Cynthia Jones  MRN: [id number]  Referring Provider: Guadalupe Maple, MD  DOB: [birth year]  PCP: Guadalupe Maple, MD  DOS: 02/08/2017  Note by: Vevelyn Francois NP  Service setting: Ambulatory outpatient  Specialty: Interventional Pain Management  Location: [facility] (AMB) Pain Management Facility    Patient type: Established    Primary Reason(s) for Visit: Encounter for prescription drug management (Level of risk: moderate) CC: No chief complaint on file.  HPI  Cynthia Jones is a 75 y.o. year old, female patient, who comes today for a medication management evaluation. She has Osteopenia; Depression; Essential hypertension; Diabetes mellitus ([place]); Degenerative joint disease of knee; Osteoarthritis; Seasonal allergic rhinitis; Obesity; Intertrigo; Fibromyalgia; Bilateral occipital neuralgia; Greater trochanteric bursitis; Osteoarthritis of knee (Location of Primary Source of Pain) (Left); Sacroiliac joint disease; Lumbar facet syndrome (Bilateral) (L>R); Neuropathy due to secondary diabetes ([place]); S/P TKR Arthroplasty (Right); Folliculitis; Insomnia; Long term current use of opiate analgesic; Long term prescription opiate use; Opiate use; Long term prescription benzodiazepine use; Chronic pain syndrome; Chronic low back pain (Location of Secondary source of pain) (Bilateral) (L>R); Chronic knee pain (Location of Primary Source of Pain) (Bilateral) (L>R); Cervicogenic headache; Chronic hip pain (Bilateral); Chronic hand pain (Bilateral) (R>L); Chronic foot pain (Left); Hypercholesteremia; and Lumbar spondylosis on her problem list. Her primarily concern today is the No chief complaint on file.  Pain Assessment: Self-Reported Pain Score:  /10             Reported level is compatible with observation.          Cynthia Jones was last scheduled for an appointment on 11/19/16 for medication management. During today's appointment we reviewed Cynthia Jones's chronic pain status, as well as her  outpatient medication regimen. She has chronic low back pain. She has radicular symptoms that do down into her/his ***. She *** any numbness tingling or weakness.   The patient  reports that she does not use drugs. Her body mass index is unknown because there is no height or weight on file.  Further details on both, my assessment(s), as well as the proposed treatment plan, please see below.  Controlled Substance Pharmacotherapy Assessment REMS (Risk Evaluation and Mitigation Strategy)  Analgesic: *** MME/day: *** mg/day.  No notes on file Pharmacokinetics: Liberation and absorption (onset of action): WNL Distribution (time to peak effect): WNL Metabolism and excretion (duration of action): WNL         Pharmacodynamics: Desired effects: Analgesia: Cynthia Jones reports >50% benefit. Functional ability: Patient reports that medication allows her to accomplish basic ADLs Clinically meaningful improvement in function (CMIF): Sustained CMIF goals met Perceived effectiveness: Described as relatively effective, allowing for increase in activities of daily living (ADL) Undesirable effects: Side-effects or Adverse reactions: None reported Monitoring: [place] PMP: Online review of the past 58-monthperiod conducted. Compliant with practice rules and regulations List of all UDS test(s) done:  Lab Results  Component Value Date   TOXASSSELUR FINAL 02/06/2016   SUMMARY FINAL 09/08/2016   Last UDS on record: ToxAssure Select 13  Date Value Ref Range Status  02/06/2016 FINAL  Final    Comment:    ==================================================================== TOXASSURE SELECT 13 (MW) ==================================================================== Test                             Result       Flag       Units Drug Present and Declared for Prescription Verification  Oxycodone                      650          EXPECTED   ng/mg creat   Noroxycodone                   1312         EXPECTED    ng/mg creat    Sources of oxycodone include scheduled prescription medications.    Noroxycodone is an expected metabolite of oxycodone. ==================================================================== Test                      Result    Flag   Units      Ref Range   Creatinine              98               mg/dL      >=20 ==================================================================== Declared Medications:  The flagging and interpretation on this report are based on the  following declared medications.  Unexpected results may arise from  inaccuracies in the declared medications.  **Note: The testing scope of this panel includes these medications:  Oxycodone (Roxicodone)  **Note: The testing scope of this panel does not include following  reported medications:  Carvedilol  Ciprofloxacin  Cyproheptadine  Duloxetine  Fluconazole  Losartan (Losartan Potassium)  Methylprednisolone  Nystatin  Omeprazole ==================================================================== For clinical consultation, please call [phone]. ====================================================================    UDS interpretation: Compliant          Medication Assessment Form: Reviewed. Patient indicates being compliant with therapy Treatment compliance: Compliant Risk Assessment Profile: Aberrant behavior: See prior evaluations. None observed or detected today Comorbid factors increasing risk of overdose: See prior notes. No additional risks detected today Risk of substance use disorder (SUD): Low Opioid Risk Tool (ORT) Total Score:    Interpretation Table:  Score <3 = Low Risk for SUD  Score between 4-7 = Moderate Risk for SUD  Score >8 = High Risk for Opioid Abuse   Risk Mitigation Strategies:  Patient Counseling: Covered Patient-Prescriber Agreement (PPA): Present and active  Notification to other healthcare providers: Done  Pharmacologic Plan: No change in therapy, at this  time  Laboratory Chemistry  Inflammation Markers Lab Results  Component Value Date   CRP 1.1 (H) 09/24/2016   ESRSEDRATE 44 (H) 09/24/2016   (CRP: Acute Phase) (ESR: Chronic Phase) Renal Function Markers Lab Results  Component Value Date   BUN 10 12/02/2016   CREATININE 0.64 12/02/2016   GFRAA 102 12/02/2016   GFRNONAA 88 12/02/2016   Hepatic Function Markers Lab Results  Component Value Date   AST 28 12/02/2016   ALT 13 12/02/2016   ALBUMIN 3.8 04/08/2016   ALKPHOS 50 04/08/2016   Electrolytes Lab Results  Component Value Date   NA 138 12/02/2016   K 4.4 12/02/2016   CL 98 12/02/2016   CALCIUM 9.3 12/02/2016   MG 2.1 09/24/2016   Neuropathy Markers Lab Results  Component Value Date   VITAMINB12 344 09/24/2016   Bone Pathology Markers Lab Results  Component Value Date   ALKPHOS 50 04/08/2016   25OHVITD1 20 (L) 09/24/2016   25OHVITD2 <1.0 09/24/2016   25OHVITD3 19 09/24/2016   CALCIUM 9.3 12/02/2016   Coagulation Parameters Lab Results  Component Value Date   PLT 314 04/08/2016   Cardiovascular Markers Lab Results  Component Value Date   HCT 39.6 04/08/2016  Note: Lab results reviewed.  Recent Diagnostic Imaging Review  Dg C-arm 1-60 Min-no Report  Result Date: 12/16/2016 Fluoroscopy was utilized by the requesting physician.  No radiographic interpretation.   Note: Imaging results reviewed.          Meds  The patient has a current medication list which includes the following prescription(s): alprazolam, carvedilol, duloxetine, esomeprazole, glucose blood, hibiclens, losartan, oxycodone, oxycodone, oxycodone, and vitamin d (ergocalciferol).  Current Outpatient Prescriptions on File Prior to Visit  Medication Sig  . ALPRAZolam (XANAX) 0.25 MG tablet Take 1 tablet (0.25 mg total) by mouth 2 (two) times daily as needed for anxiety.  . carvedilol (COREG) 12.5 MG tablet Take 1 tablet (12.5 mg total) by mouth 2 (two) times daily with a meal.  .  DULoxetine (CYMBALTA) 60 MG capsule Take 1 capsule (60 mg total) by mouth daily.  [name] esomeprazole (NEXIUM) 40 MG capsule Take 40 mg by mouth daily at 12 noon.  [name] glucose blood (IGLUCOSE TEST STRIPS) test strip Use as instructed - freestyle insulin x strip  . HIBICLENS 4 % external liquid APPLY TOPICALLY DAILY AS NEEDED.  [name] losartan (COZAAR) 100 MG tablet Take 1 tablet (100 mg total) by mouth daily.  [name] oxyCODONE (OXY IR/ROXICODONE) 5 MG immediate release tablet Take 1 tablet (5 mg total) by mouth 2 (two) times daily.  [name] oxyCODONE (OXY IR/ROXICODONE) 5 MG immediate release tablet Take 1 tablet (5 mg total) by mouth 2 (two) times daily.  [name] oxyCODONE (OXY IR/ROXICODONE) 5 MG immediate release tablet Take 1 tablet (5 mg total) by mouth 2 (two) times daily.  . Vitamin D, Ergocalciferol, (DRISDOL) 50000 units CAPS capsule Take 1 capsule (50,000 Units total) by mouth every 7 (seven) days.   No current facility-administered medications on file prior to visit.    ROS  Constitutional: Denies any fever or chills Gastrointestinal: No reported hemesis, hematochezia, vomiting, or acute GI distress Musculoskeletal: Denies any acute onset joint swelling, redness, loss of ROM, or weakness Neurological: No reported episodes of acute onset apraxia, aphasia, dysarthria, agnosia, amnesia, paralysis, loss of coordination, or loss of consciousness  Allergies  Cynthia Jones is allergic to nsaids and erythromycin.  [place]  Drug: Cynthia Jones  reports that she does not use drugs. Alcohol:  reports that she does not drink alcohol. Tobacco:  reports that she has never smoked. She has never used smokeless tobacco. Medical:  has a past medical history of Acute anxiety (03/06/2015); Allergy; Anxiety; Degenerative joint disease of knee; Depression; Fibromyalgia; Obesity; Osteopenia; and RAD (reactive airway disease). Family: family history includes Alzheimer's disease in her father; Cancer in her sister; Diabetes in her  mother; Heart disease in her mother; Parkinson's disease in her father; Stroke in her mother.  Past Surgical History:  Procedure Laterality Date  . ABDOMINAL HYSTERECTOMY    . APPENDECTOMY    . CHOLECYSTECTOMY    . EYE SURGERY    . JOINT REPLACEMENT  2014   right knee  . PAROTIDECTOMY Left   . TONSILLECTOMY     Constitutional Exam  General appearance: Well nourished, well developed, and well hydrated. In no apparent acute distress There were no vitals filed for this visit. BMI Assessment: Estimated body mass index is 42.97 kg/m as calculated from the following:   Height as of 12/30/16: 5' (1.524 m).   Weight as of 12/30/16: 220 lb (99.8 kg).  BMI interpretation table: BMI level Category Range association with higher incidence of chronic pain  <18 kg/m2 Underweight  18.5-24.9 kg/m2 Ideal body weight   25-29.9 kg/m2 Overweight Increased incidence by 20%  30-34.9 kg/m2 Obese (Class I) Increased incidence by 68%  35-39.9 kg/m2 Severe obesity (Class II) Increased incidence by 136%  >40 kg/m2 Extreme obesity (Class III) Increased incidence by 254%   BMI Readings from Last 4 Encounters:  12/30/16 42.97 kg/m  12/16/16 42.97 kg/m  12/02/16 43.94 kg/m  11/19/16 43.36 kg/m   Wt Readings from Last 4 Encounters:  12/30/16 220 lb (99.8 kg)  12/16/16 220 lb (99.8 kg)  12/02/16 225 lb (102.1 kg)  11/19/16 222 lb (100.7 kg)  Psych/Mental status: Alert, oriented x 3 (person, place, & time)       Eyes: PERLA Respiratory: No evidence of acute respiratory distress  Cervical Spine Exam  Inspection: No masses, redness, or swelling Alignment: Symmetrical Functional ROM: Unrestricted ROM      Stability: No instability detected Muscle strength & Tone: Functionally intact Sensory: Unimpaired Palpation: No palpable anomalies              Upper Extremity (UE) Exam    Side: Right upper extremity  Side: Left upper extremity  Inspection: No masses, redness, swelling, or asymmetry. No  contractures  Inspection: No masses, redness, swelling, or asymmetry. No contractures  Functional ROM: Unrestricted ROM          Functional ROM: Unrestricted ROM          Muscle strength & Tone: Functionally intact  Muscle strength & Tone: Functionally intact  Sensory: Unimpaired  Sensory: Unimpaired  Palpation: No palpable anomalies              Palpation: No palpable anomalies              Specialized Test(s): Deferred         Specialized Test(s): Deferred          Thoracic Spine Exam  Inspection: No masses, redness, or swelling Alignment: Symmetrical Functional ROM: Unrestricted ROM Stability: No instability detected Sensory: Unimpaired Muscle strength & Tone: No palpable anomalies  Lumbar Spine Exam  Inspection: No masses, redness, or swelling Alignment: Symmetrical Functional ROM: Unrestricted ROM      Stability: No instability detected Muscle strength & Tone: Functionally intact Sensory: Unimpaired Palpation: No palpable anomalies       Provocative Tests: Lumbar Hyperextension and rotation test: evaluation deferred today       Patrick's Maneuver: evaluation deferred today                    Gait & Posture Assessment  Ambulation: Unassisted Gait: Relatively normal for age and body habitus Posture: WNL   Lower Extremity Exam    Side: Right lower extremity  Side: Left lower extremity  Inspection: No masses, redness, swelling, or asymmetry. No contractures  Inspection: No masses, redness, swelling, or asymmetry. No contractures  Functional ROM: Unrestricted ROM          Functional ROM: Unrestricted ROM          Muscle strength & Tone: Functionally intact  Muscle strength & Tone: Functionally intact  Sensory: Unimpaired  Sensory: Unimpaired  Palpation: No palpable anomalies  Palpation: No palpable anomalies   Assessment  Primary Diagnosis & Pertinent Problem List: The encounter diagnosis was Chronic pain syndrome.  Status Diagnosis  Controlled Controlled Controlled 1.  Chronic pain syndrome     Problems updated and reviewed during this visit: Problem  Lumbar spondylosis  Chronic Pain Syndrome  Chronic low back pain (Location of Secondary source  of pain) (Bilateral) (L>R)  Chronic knee pain (Location of Primary Source of Pain) (Bilateral) (L>R)  Cervicogenic Headache  Chronic hip pain (Bilateral)  Chronic hand pain (Bilateral) (R>L)  Chronic foot pain (Left)  S/P TKR Arthroplasty (Right)  Fibromyalgia  Osteoarthritis of knee (Location of Primary Source of Pain) (Left)  Sacroiliac Joint Disease  Lumbar facet syndrome (Bilateral) (L>R)  Neuropathy due to secondary diabetes ([facility])  Degenerative Joint Disease of Knee  Osteoarthritis  Long Term Current Use of Opiate Analgesic  Long Term Prescription Opiate Use  Opiate Use  Long Term Prescription Benzodiazepine Use  Hypercholesteremia  Insomnia  Folliculitis  Bilateral Occipital Neuralgia  Greater Trochanteric Bursitis  Osteopenia  Depression  Essential Hypertension  Diabetes Mellitus ([facility])  Seasonal Allergic Rhinitis  Obesity  Intertrigo   Plan of Care  Pharmacotherapy (Medications Ordered): No orders of the defined types were placed in this encounter.  New Prescriptions   No medications on file   Medications administered today: Cynthia Jones had no medications administered during this visit. Lab-work, procedure(s), and/or referral(s): No orders of the defined types were placed in this encounter.  Imaging and/or referral(s): None  Interventional therapies: Planned, scheduled, and/or pending:   Not at this time.   Considering:   Diagnostic right Genicular nerve block  Possible right Genicular nerve radiofrequency ablation  Diagnostic left intra-articular knee joint injection  Possible series of 5 left intra-articular Hyalgan knee injections  Diagnostic left Genicular nerve block  Possible left Genicular nerve RFA  Diagnostic bilateral lumbar facet block  Possible bilateral  lumbar facet RFA  Diagnostic left lumbar epidural steroid injection  Diagnostic caudal epidural steroid injection + epidurogram  Possible Racz procedure  Diagnostic bilateral cervical facet block  Possible bilateral cervical facet RFA  Diagnostic right versus left cervical epidural steroid injection  Diagnostic bilateral intra-articular shoulder joint injection  Diagnostic bilateral suprascapular nerve block  Possible bilateral suprascapular nerve RFA    Palliative PRN treatment(s):   Palliative bilateral Genicular nerve block  Palliative left intra-articular knee joint injection  Palliative bilateral lumbar facet block    Provider-requested follow-up: No Follow-up on file.  Future Appointments Date Time Provider [place]  02/08/2017 8:40 AM Vevelyn Francois, NP [facility] None  04/05/2017 10:00 AM Guadalupe Maple, MD [facility] None   Primary Care Physician: Guadalupe Maple, MD Location: [facility] [place] [facility] Note by: Vevelyn Francois NP Date: 02/08/2017; Time: 11:55 AM  Pain Score Disclaimer: We use the NRS-11 scale. This is a self-reported, subjective measurement of pain severity with only modest accuracy. It is used primarily to identify changes within a particular patient. It must be understood that outpatient pain scales are significantly less accurate that those used for research, where they can be applied under ideal controlled circumstances with minimal exposure to variables. In reality, the score is likely to be a combination of pain intensity and pain affect, where pain affect describes the degree of emotional arousal or changes in action readiness caused by the sensory experience of pain. Factors such as social and work situation, setting, emotional state, anxiety levels, expectation, and prior pain experience may influence pain perception and show large inter-individual differences that may also be affected by time variables.  Patient instructions  provided during this appointment: There are no Patient Instructions on file for this visit.

## 2017-02-03 ENCOUNTER — Ambulatory Visit (INDEPENDENT_AMBULATORY_CARE_PROVIDER_SITE_OTHER): Payer: Medicare Other | Admitting: Family Medicine

## 2017-02-03 ENCOUNTER — Encounter: Payer: Self-pay | Admitting: Family Medicine

## 2017-02-03 VITALS — BP 125/72 | HR 73 | Temp 97.8°F | Wt 222.6 lb

## 2017-02-03 DIAGNOSIS — B372 Candidiasis of skin and nail: Secondary | ICD-10-CM

## 2017-02-03 DIAGNOSIS — L739 Follicular disorder, unspecified: Secondary | ICD-10-CM | POA: Diagnosis not present

## 2017-02-03 MED ORDER — NYSTATIN 100000 UNIT/GM EX POWD: Freq: Four times a day (QID) | CUTANEOUS | 0 refills | Status: DC

## 2017-02-03 MED ORDER — SULFAMETHOXAZOLE-TRIMETHOPRIM 800-160 MG PO TABS: 1.0000 | ORAL_TABLET | Freq: Two times a day (BID) | ORAL | 0 refills | Status: DC

## 2017-02-03 MED ORDER — CHLORHEXIDINE GLUCONATE 4 % EX LIQD: Freq: Every day | CUTANEOUS | 3 refills | Status: DC | PRN

## 2017-02-03 MED ORDER — NYSTATIN 100000 UNIT/GM EX CREA: 1.0000 "application " | TOPICAL_CREAM | Freq: Two times a day (BID) | CUTANEOUS | 0 refills | Status: DC

## 2017-02-03 NOTE — Assessment & Plan Note (Signed)
Bactrim sent. Continue hibiclens and neosporin, warm compresses prn.

## 2017-02-03 NOTE — Progress Notes (Signed)
   BP 125/72 (BP Location: Left Arm, Patient Position: Sitting, Cuff Size: Normal)   Pulse 73   Temp 97.8 F (36.6 C)   Wt 222 lb 9.6 oz (101 kg)   SpO2 100%   BMI 43.47 kg/m    Subjective:    Patient ID: Cynthia Jones, female    DOB: [birth year], 76 y.o.   MRN: [id number]  HPI: Cynthia Jones is a 76 y.o. female  Chief Complaint  Patient presents with  . Bumps    x's 2 weeks. Mostly on left arm and stomach area.   Patient with 2 week hx of infected bumps across right leg, abdomen, and left arm. Worst place is left wrist, area is becoming a large boil that has been draining purulent material. Had similar issue several months ago and has been following recommendations with hibiclens wash and neosporin to the areas. Denies fever, chills, pain.   Relevant past medical, surgical, family and social history reviewed and updated as indicated. Interim medical history since our last visit reviewed. Allergies and medications reviewed and updated.  Review of Systems  Constitutional: Negative.   Respiratory: Negative.   Cardiovascular: Negative.   Gastrointestinal: Negative.   Genitourinary: Negative.   Musculoskeletal: Negative.   Skin:       Skin infections  Neurological: Negative.   Psychiatric/Behavioral: Negative.     Per HPI unless specifically indicated above     Objective:    BP 125/72 (BP Location: Left Arm, Patient Position: Sitting, Cuff Size: Normal)   Pulse 73   Temp 97.8 F (36.6 C)   Wt 222 lb 9.6 oz (101 kg)   SpO2 100%   BMI 43.47 kg/m   Wt Readings from Last 3 Encounters:  02/03/17 222 lb 9.6 oz (101 kg)  12/30/16 220 lb (99.8 kg)  12/16/16 220 lb (99.8 kg)    Physical Exam  Constitutional: She is oriented to person, place, and time. She appears well-developed and well-nourished.  Eyes: Conjunctivae are normal. Pupils are equal, round, and reactive to light.  Neck: Normal range of motion. Neck supple.  Cardiovascular: Normal rate.     Pulmonary/Chest: Effort normal and breath sounds normal. No respiratory distress.  Musculoskeletal: Normal range of motion.  Neurological: She is alert and oriented to person, place, and time.  Skin: Skin is warm and dry.  Inflamed boil on left wrist that has recently drained.  Several smaller bumps across other parts of body   Candida rash underneath breasts b/l  Psychiatric: She has a normal mood and affect. Her behavior is normal.  Nursing note and vitals reviewed.     Assessment & Plan:   Problem List Items Addressed This Visit      Musculoskeletal and Integument   Folliculitis - Primary    Bactrim sent. Continue hibiclens and neosporin, warm compresses prn.        Other Visit Diagnoses    Cutaneous candidiasis       Nystatin cream and powder sent. Discussed to keep the area clean and dry.    Relevant Medications   sulfamethoxazole-trimethoprim (BACTRIM DS,SEPTRA DS) 800-160 MG tablet   nystatin cream (MYCOSTATIN)   nystatin (NYSTATIN) powder       Follow up plan: Return for as scheduled.

## 2017-02-08 ENCOUNTER — Ambulatory Visit: Payer: Medicare Other | Attending: Nurse Practitioner | Admitting: Nurse Practitioner

## 2017-02-10 ENCOUNTER — Ambulatory Visit: Payer: Medicare Other | Attending: Nurse Practitioner | Admitting: Nurse Practitioner

## 2017-02-10 ENCOUNTER — Encounter: Payer: Self-pay | Admitting: Nurse Practitioner

## 2017-02-10 VITALS — BP 151/53 | HR 78 | Temp 98.1°F | Resp 18 | Ht 60.0 in | Wt 222.0 lb

## 2017-02-10 DIAGNOSIS — M4696 Unspecified inflammatory spondylopathy, lumbar region: Secondary | ICD-10-CM

## 2017-02-10 DIAGNOSIS — L304 Erythema intertrigo: Secondary | ICD-10-CM | POA: Diagnosis not present

## 2017-02-10 DIAGNOSIS — R51 Headache: Secondary | ICD-10-CM | POA: Diagnosis not present

## 2017-02-10 DIAGNOSIS — E669 Obesity, unspecified: Secondary | ICD-10-CM | POA: Diagnosis not present

## 2017-02-10 DIAGNOSIS — Z79899 Other long term (current) drug therapy: Secondary | ICD-10-CM | POA: Insufficient documentation

## 2017-02-10 DIAGNOSIS — Z9049 Acquired absence of other specified parts of digestive tract: Secondary | ICD-10-CM | POA: Diagnosis not present

## 2017-02-10 DIAGNOSIS — M8580 Other specified disorders of bone density and structure, unspecified site: Secondary | ICD-10-CM | POA: Diagnosis not present

## 2017-02-10 DIAGNOSIS — M79641 Pain in right hand: Secondary | ICD-10-CM | POA: Insufficient documentation

## 2017-02-10 DIAGNOSIS — Z79891 Long term (current) use of opiate analgesic: Secondary | ICD-10-CM

## 2017-02-10 DIAGNOSIS — I10 Essential (primary) hypertension: Secondary | ICD-10-CM | POA: Insufficient documentation

## 2017-02-10 DIAGNOSIS — Z6841 Body Mass Index (BMI) 40.0 and over, adult: Secondary | ICD-10-CM | POA: Diagnosis not present

## 2017-02-10 DIAGNOSIS — M797 Fibromyalgia: Secondary | ICD-10-CM | POA: Diagnosis not present

## 2017-02-10 DIAGNOSIS — E7800 Pure hypercholesterolemia, unspecified: Secondary | ICD-10-CM | POA: Diagnosis not present

## 2017-02-10 DIAGNOSIS — Z794 Long term (current) use of insulin: Secondary | ICD-10-CM | POA: Insufficient documentation

## 2017-02-10 DIAGNOSIS — G4700 Insomnia, unspecified: Secondary | ICD-10-CM | POA: Insufficient documentation

## 2017-02-10 DIAGNOSIS — M150 Primary generalized (osteo)arthritis: Secondary | ICD-10-CM | POA: Diagnosis not present

## 2017-02-10 DIAGNOSIS — Z9889 Other specified postprocedural states: Secondary | ICD-10-CM | POA: Insufficient documentation

## 2017-02-10 DIAGNOSIS — E1140 Type 2 diabetes mellitus with diabetic neuropathy, unspecified: Secondary | ICD-10-CM | POA: Insufficient documentation

## 2017-02-10 DIAGNOSIS — M47816 Spondylosis without myelopathy or radiculopathy, lumbar region: Secondary | ICD-10-CM

## 2017-02-10 DIAGNOSIS — F329 Major depressive disorder, single episode, unspecified: Secondary | ICD-10-CM | POA: Insufficient documentation

## 2017-02-10 DIAGNOSIS — M1990 Unspecified osteoarthritis, unspecified site: Secondary | ICD-10-CM | POA: Insufficient documentation

## 2017-02-10 DIAGNOSIS — M549 Dorsalgia, unspecified: Secondary | ICD-10-CM | POA: Insufficient documentation

## 2017-02-10 DIAGNOSIS — M25551 Pain in right hip: Secondary | ICD-10-CM | POA: Diagnosis not present

## 2017-02-10 DIAGNOSIS — M25552 Pain in left hip: Secondary | ICD-10-CM | POA: Diagnosis not present

## 2017-02-10 DIAGNOSIS — G894 Chronic pain syndrome: Secondary | ICD-10-CM

## 2017-02-10 DIAGNOSIS — M159 Polyosteoarthritis, unspecified: Secondary | ICD-10-CM

## 2017-02-10 DIAGNOSIS — J45909 Unspecified asthma, uncomplicated: Secondary | ICD-10-CM | POA: Diagnosis not present

## 2017-02-10 MED ORDER — OXYCODONE HCL 5 MG PO TABS: 5.0000 mg | ORAL_TABLET | Freq: Two times a day (BID) | ORAL | 0 refills | Status: DC

## 2017-02-10 NOTE — Progress Notes (Signed)
Patient's Name: Cynthia Jones  MRN: [id number]  Referring Provider: Guadalupe Maple, MD  DOB: 12/28/1940  PCP: Guadalupe Maple, MD  DOS: 02/10/2017  Note by: Vevelyn Francois NP  Service setting: Ambulatory outpatient  Specialty: Interventional Pain Management  Location: [facility] (AMB) Pain Management Facility    Patient type: Established    Primary Reason(s) for Visit: Encounter for prescription drug management (Level of risk: moderate) CC: Hand Pain (bilateral) and Back Pain (lower)  HPI  Cynthia Jones is a 76 y.o. year old, female patient, who comes today for a medication management evaluation. She has Osteopenia; Depression; Essential hypertension; Diabetes mellitus ([place]); Degenerative joint disease of knee; Osteoarthritis; Seasonal allergic rhinitis; Obesity; Intertrigo; Fibromyalgia; Bilateral occipital neuralgia; Greater trochanteric bursitis; Osteoarthritis of knee (Location of Primary Source of Pain) (Left); Sacroiliac joint disease; Lumbar facet syndrome (Bilateral) (L>R); Neuropathy due to secondary diabetes ([place]); S/P TKR Arthroplasty (Right); Folliculitis; Insomnia; Long term current use of opiate analgesic; Long term prescription opiate use; Opiate use; Long term prescription benzodiazepine use; Chronic pain syndrome; Chronic low back pain (Location of Secondary source of pain) (Bilateral) (L>R); Chronic knee pain (Location of Primary Source of Pain) (Bilateral) (L>R); Cervicogenic headache; Chronic hip pain (Bilateral); Chronic hand pain (Bilateral) (R>L); Chronic foot pain (Left); Hypercholesteremia; and Lumbar spondylosis on her problem list. Her primarily concern today is the Hand Pain (bilateral) and Back Pain (lower)  Pain Assessment: Self-Reported Pain Score: 6 /10 Clinically the patient looks like a 4/10 Reported level is inconsistent with clinical observations. Information on the proper use of the pain scale provided to the patient today Pain Type: Chronic pain Pain Location:  Hand Pain Orientation: Right, Left Pain Descriptors / Indicators: Aching Pain Frequency: Constant  Cynthia Jones was last scheduled for an appointment on 11/19/2016 for medication management. During today's appointment we reviewed Cynthia Jones's chronic pain status, as well as her outpatient medication regimen. She admits that her hands are the worse. She has RA in her right hand. She states that her low back is painful also. She states that it is worse in the morning. She states that she standing at the sink her back pain is worse and she has to go and sit down. She denies any numbness or tingling. She   The patient  reports that she does not use drugs. Her body mass index is 43.36 kg/m.  Further details on both, my assessment(s), as well as the proposed treatment plan, please see below.  Controlled Substance Pharmacotherapy Assessment REMS (Risk Evaluation and Mitigation Strategy)  Analgesic:Oxycodone IR 5 mg 1 tablet by mouth twice a day MME/day:43m/day   WLandis Martins RN  02/10/2017  2:03 PM  Sign at close encounter Nursing Pain Medication Assessment:  Safety precautions to be maintained throughout the outpatient stay will include: orient to surroundings, keep bed in low position, maintain call bell within reach at all times, provide assistance with transfer out of bed and ambulation.  Medication Inspection Compliance: Ms. JKidneydid not comply with our request to bring her pills to be counted. She was reminded that bringing the medication bottles, even when empty, is a requirement.  Medication: None brought in. Pill/Patch Count: None available to be counted. Bottle Appearance: No container available. Did not bring bottle(s) to appointment. Filled Date: N/A Last Medication intake:  Yesterday   Pharmacokinetics: Liberation and absorption (onset of action): WNL Distribution (time to peak effect): WNL Metabolism and excretion (duration of action): WNL  Pharmacodynamics: Desired effects: Analgesia: Cynthia Jones reports >50% benefit. Functional ability: Patient reports that medication allows her to accomplish basic ADLs Clinically meaningful improvement in function (CMIF): Sustained CMIF goals met Perceived effectiveness: Described as relatively effective, allowing for increase in activities of daily living (ADL) Undesirable effects: Side-effects or Adverse reactions: None reported Monitoring: [place] PMP: Online review of the past 47-monthperiod conducted. Compliant with practice rules and regulations List of all UDS test(s) done:  Lab Results  Component Value Date   TOXASSSELUR FINAL 02/06/2016   SUMMARY FINAL 09/08/2016   Last UDS on record: ToxAssure Select 13  Date Value Ref Range Status  02/06/2016 FINAL  Final    Comment:    ==================================================================== TOXASSURE SELECT 13 (MW) ==================================================================== Test                             Result       Flag       Units Drug Present and Declared for Prescription Verification   Oxycodone                      650          EXPECTED   ng/mg creat   Noroxycodone                   1312         EXPECTED   ng/mg creat    Sources of oxycodone include scheduled prescription medications.    Noroxycodone is an expected metabolite of oxycodone. ==================================================================== Test                      Result    Flag   Units      Ref Range   Creatinine              98               mg/dL      >=20 ==================================================================== Declared Medications:  The flagging and interpretation on this report are based on the  following declared medications.  Unexpected results may arise from  inaccuracies in the declared medications.  **Note: The testing scope of this panel includes these medications:  Oxycodone (Roxicodone)  **Note: The testing  scope of this panel does not include following  reported medications:  Carvedilol  Ciprofloxacin  Cyproheptadine  Duloxetine  Fluconazole  Losartan (Losartan Potassium)  Methylprednisolone  Nystatin  Omeprazole ==================================================================== For clinical consultation, please call ([phone]====    UDS interpretation: Compliant          Medication Assessment Form: Reviewed. Patient indicates being compliant with therapy Treatment compliance: Compliant Risk Assessment Profile: Aberrant behavior: See prior evaluations. None observed or detected today Comorbid factors increasing risk of overdose: See prior notes. No additional risks detected today Risk of substance use disorder (SUD): Low Opioid Risk Tool (ORT) Total Score: 1  Interpretation Table:  Score <3 = Low Risk for SUD  Score between 4-7 = Moderate Risk for SUD  Score >8 = High Risk for Opioid Abuse   Risk Mitigation Strategies:  Patient Counseling: Covered Patient-Prescriber Agreement (PPA): Present and active  Notification to other healthcare providers: Done  Pharmacologic Plan: No change in therapy, at this time  Laboratory Chemistry  Inflammation Markers Lab Results  Component Value Date   CRP 1.1 (H) 09/24/2016   ESRSEDRATE 44 (H) 09/24/2016   (CRP: Acute Phase) (ESR: Chronic Phase)  Renal Function Markers Lab Results  Component Value Date   BUN 10 12/02/2016   CREATININE 0.64 12/02/2016   GFRAA 102 12/02/2016   GFRNONAA 88 12/02/2016   Hepatic Function Markers Lab Results  Component Value Date   AST 28 12/02/2016   ALT 13 12/02/2016   ALBUMIN 3.8 04/08/2016   ALKPHOS 50 04/08/2016   Electrolytes Lab Results  Component Value Date   NA 138 12/02/2016   K 4.4 12/02/2016   CL 98 12/02/2016   CALCIUM 9.3 12/02/2016   MG 2.1 09/24/2016   Neuropathy Markers Lab Results  Component Value  Date   VITAMINB12 344 09/24/2016   Bone Pathology Markers Lab Results  Component Value Date   ALKPHOS 50 04/08/2016   25OHVITD1 20 (L) 09/24/2016   25OHVITD2 <1.0 09/24/2016   25OHVITD3 19 09/24/2016   CALCIUM 9.3 12/02/2016   Coagulation Parameters Lab Results  Component Value Date   PLT 314 04/08/2016   Cardiovascular Markers Lab Results  Component Value Date   HGB 13.2 04/08/2016   HCT 39.6 04/08/2016   Note: Lab results reviewed.  Recent Diagnostic Imaging Review  Dg C-arm 1-60 Min-no Report  Result Date: 12/16/2016 Fluoroscopy was utilized by the requesting physician.  No radiographic interpretation.   Note: Imaging results reviewed.          Meds  The patient has a current medication list which includes the following prescription(s): alprazolam, carvedilol, chlorhexidine, duloxetine, esomeprazole, glucose blood, losartan, nystatin, nystatin cream, oxycodone, oxycodone, and oxycodone.  Current Outpatient Prescriptions on File Prior to Visit  Medication Sig  . ALPRAZolam (XANAX) 0.25 MG tablet Take 1 tablet (0.25 mg total) by mouth 2 (two) times daily as needed for anxiety.  . carvedilol (COREG) 12.5 MG tablet Take 1 tablet (12.5 mg total) by mouth 2 (two) times daily with a meal.  . chlorhexidine (HIBICLENS) 4 % external liquid Apply topically daily as needed.  . DULoxetine (CYMBALTA) 60 MG capsule Take 1 capsule (60 mg total) by mouth daily.  [name] esomeprazole (NEXIUM) 40 MG capsule Take 40 mg by mouth daily at 12 noon.  [name] glucose blood (IGLUCOSE TEST STRIPS) test strip Use as instructed - freestyle insulin x strip  . losartan (COZAAR) 100 MG tablet Take 1 tablet (100 mg total) by mouth daily.  [name] nystatin (NYSTATIN) powder Apply topically 4 (four) times daily.  [name] nystatin cream (MYCOSTATIN) Apply 1 application topically 2 (two) times daily.   No current facility-administered medications on file prior to visit.    ROS  Constitutional: Denies any fever or  chills Gastrointestinal: No reported hemesis, hematochezia, vomiting, or acute GI distress Musculoskeletal: Denies any acute onset joint swelling, redness, loss of ROM, or weakness Neurological: No reported episodes of acute onset apraxia, aphasia, dysarthria, agnosia, amnesia, paralysis, loss of coordination, or loss of consciousness  Allergies  Ms. Kaner is allergic to nsaids and erythromycin.  [place]  Drug: Ms. Sockwell  reports that she does not use drugs. Alcohol:  reports that she does not drink alcohol. Tobacco:  reports that she has never smoked. She has never used smokeless tobacco. Medical:  has a past medical history of Acute anxiety (03/06/2015); Allergy; Anxiety; Degenerative joint disease of knee; Depression; Fibromyalgia; Obesity; Osteopenia; and RAD (reactive airway disease). Family: family history includes Alzheimer's disease in her father; Cancer in her sister; Diabetes in her mother; Heart disease in her mother; Parkinson's disease in her father; Stroke in her mother.  Past Surgical History:  Procedure Laterality Date  . ABDOMINAL  HYSTERECTOMY    . APPENDECTOMY    . CHOLECYSTECTOMY    . EYE SURGERY    . JOINT REPLACEMENT  2014   right knee  . PAROTIDECTOMY Left   . TONSILLECTOMY     Constitutional Exam  General appearance: Well nourished, well developed, and well hydrated. In no apparent acute distress Vitals:   02/10/17 1354  BP: (!) 151/53  Pulse: 78  Resp: 18  Temp: 98.1 F (36.7 C)  TempSrc: Oral  SpO2: 100%  Weight: 222 lb (100.7 kg)  Height: 5' (1.524 m)   BMI Assessment: Estimated body mass index is 43.36 kg/m as calculated from the following:   Height as of this encounter: 5' (1.524 m).   Weight as of this encounter: 222 lb (100.7 kg).  BMI interpretation table: BMI level Category Range association with higher incidence of chronic pain  <18 kg/m2 Underweight   18.5-24.9 kg/m2 Ideal body weight   25-29.9 kg/m2 Overweight Increased incidence  by 20%  30-34.9 kg/m2 Obese (Class I) Increased incidence by 68%  35-39.9 kg/m2 Severe obesity (Class II) Increased incidence by 136%  >40 kg/m2 Extreme obesity (Class III) Increased incidence by 254%   BMI Readings from Last 4 Encounters:  02/10/17 43.36 kg/m  02/03/17 43.47 kg/m  12/30/16 42.97 kg/m  12/16/16 42.97 kg/m   Wt Readings from Last 4 Encounters:  02/10/17 222 lb (100.7 kg)  02/03/17 222 lb 9.6 oz (101 kg)  12/30/16 220 lb (99.8 kg)  12/16/16 220 lb (99.8 kg)  Psych/Mental status: Alert, oriented x 3 (person, place, & time)       Eyes: PERLA Respiratory: No evidence of acute respiratory distress  Cervical Spine Exam  Inspection: No masses, redness, or swelling Alignment: Symmetrical Functional ROM: Unrestricted ROM      Stability: No instability detected Muscle strength & Tone: Functionally intact Sensory: Unimpaired Palpation: No palpable anomalies              Upper Extremity (UE) Exam    Side: Right upper extremity  Side: Left upper extremity  Inspection: Bouchard's nodes (PIP)  Inspection: No masses, redness, swelling, or asymmetry. No contractures  Functional ROM: Unrestricted ROM          Functional ROM: Unrestricted ROM          Muscle strength & Tone: Functionally intact  Muscle strength & Tone: Functionally intact  Sensory: Unimpaired  Sensory: Unimpaired  Palpation: No palpable anomalies              Palpation: No palpable anomalies              Specialized Test(s): Deferred         Specialized Test(s): Deferred          Thoracic Spine Exam  Inspection: No masses, redness, or swelling Alignment: Symmetrical Functional ROM: Unrestricted ROM Stability: No instability detected Sensory: Unimpaired Muscle strength & Tone: No palpable anomalies  Lumbar Spine Exam  Inspection: No masses, redness, or swelling Alignment: Symmetrical Functional ROM: Unrestricted ROM      Stability: No instability detected Muscle strength & Tone: Functionally  intact Sensory: Unimpaired Palpation: Complains of area being tender to palpation       Provocative Tests: Lumbar Hyperextension and rotation test: evaluation deferred today       Patrick's Maneuver: evaluation deferred today                    Gait & Posture Assessment  Ambulation: Unassisted Gait: Relatively normal for  age and body habitus Posture: WNL   Lower Extremity Exam    Side: Right lower extremity  Side: Left lower extremity  Inspection: No masses, redness, swelling, or asymmetry. No contractures  Inspection: No masses, redness, swelling, or asymmetry. No contractures  Functional ROM: Unrestricted ROM          Functional ROM: Unrestricted ROM          Muscle strength & Tone: Functionally intact  Muscle strength & Tone: Functionally intact  Sensory: Unimpaired  Sensory: Unimpaired  Palpation: No palpable anomalies  Palpation: No palpable anomalies   Assessment  Primary Diagnosis & Pertinent Problem List: The primary encounter diagnosis was Lumbar facet syndrome (Bilateral) (L>R). Diagnoses of Lumbar spondylosis, Fibromyalgia, Osteoarthritis, Chronic pain syndrome, and Long term current use of opiate analgesic were also pertinent to this visit.  Status Diagnosis  Controlled Controlled Controlled 1. Lumbar facet syndrome (Bilateral) (L>R)   2. Lumbar spondylosis   3. Fibromyalgia   4. Osteoarthritis   5. Chronic pain syndrome   6. Long term current use of opiate analgesic     Problems updated and reviewed during this visit: No problems updated. Plan of Care  Pharmacotherapy (Medications Ordered): Meds ordered this encounter  Medications  . oxyCODONE (OXY IR/ROXICODONE) 5 MG immediate release tablet    Sig: Take 1 tablet (5 mg total) by mouth 2 (two) times daily.    Dispense:  60 tablet    Refill:  0    Fill one day early if pharmacy is closed on scheduled refill date. Do not fill until: 02/21/17 To last until: 03/23/17    Order Specific Question:   Supervising  Provider    Answer:   Milinda Pointer [phone]  . oxyCODONE (OXY IR/ROXICODONE) 5 MG immediate release tablet    Sig: Take 1 tablet (5 mg total) by mouth 2 (two) times daily.    Dispense:  60 tablet    Refill:  0    Fill one day early if pharmacy is closed on scheduled refill date. Do not fill until: 04/22/17 To last until: 05/22/17    Order Specific Question:   Supervising Provider    Answer:   Milinda Pointer [phone]  . oxyCODONE (OXY IR/ROXICODONE) 5 MG immediate release tablet    Sig: Take 1 tablet (5 mg total) by mouth 2 (two) times daily.    Dispense:  60 tablet    Refill:  0    Fill one day early if pharmacy is closed on scheduled refill date. Do not fill until: 03/23/17 To last until: 04/22/17    Order Specific Question:   Supervising Provider    Answer:   Milinda Pointer [phone]   New Prescriptions   No medications on file   Medications administered today: Ms. Osoria had no medications administered during this visit. Lab-work, procedure(s), and/or referral(s): Orders Placed This Encounter  Procedures  . ToxASSURE Select 13 (MW), Urine   Imaging and/or referral(s): None  Interventional therapies: Planned, scheduled, and/or pending:   Not at this time.   Considering:   Diagnostic right Genicular nerve block  Possible right Genicular nerve radiofrequency ablation  Diagnostic left intra-articular knee joint injection  Possible series of 5 left intra-articular Hyalgan knee injections  Diagnostic left Genicular nerve block  Possible left Genicular nerve RFA  Diagnostic bilateral lumbar facet block  Possible bilateral lumbar facet RFA  Diagnostic left lumbar epidural steroid injection  Diagnostic caudal epidural steroid injection + epidurogram  Possible Racz procedure  Diagnostic bilateral cervical facet  block  Possible bilateral cervical facet RFA  Diagnostic right versus left cervical epidural steroid injection  Diagnostic bilateral intra-articular  shoulder joint injection  Diagnostic bilateral suprascapular nerve block  Possible bilateral suprascapular nerve RFA    Palliative PRN treatment(s):   Palliative bilateral Genicular nerve block  Palliative left intra-articular knee joint injection  Palliative bilateral lumbar facet block  Palliative left lumbar epidural steroid injection  Palliative caudal epidural steroid injection + epidurogram  Palliative bilateral cervical facet block  Palliative right versus left cervical epidural steroid injection  Palliative bilateral intra-articular shoulder joint injection  Palliative bilateral suprascapular nerve block    Provider-requested follow-up: Return in about 3 months (around 05/13/2017) for MedMgmt.  Future Appointments Date Time Provider [place]  04/05/2017 10:00 AM Guadalupe Maple, MD [facility] None  05/12/2017 1:45 PM Vevelyn Francois, NP [facility] None   Primary Care Physician: Guadalupe Maple, MD Location: [facility] Facility Note by: Vevelyn Francois NP Date: 02/10/2017; Time: 3:44 PM  Pain Score Disclaimer: We use the NRS-11 scale. This is a self-reported, subjective measurement of pain severity with only modest accuracy. It is used primarily to identify changes within a particular patient. It must be understood that outpatient pain scales are significantly less accurate that those used for research, where they can be applied under ideal controlled circumstances with minimal exposure to variables. In reality, the score is likely to be a combination of pain intensity and pain affect, where pain affect describes the degree of emotional arousal or changes in action readiness caused by the sensory experience of pain. Factors such as social and work situation, setting, emotional state, anxiety levels, expectation, and prior pain experience may influence pain perception and show large inter-individual differences that may also be affected by time  variables.  Patient instructions provided during this appointment: Patient Instructions   ____________________________________________________________________________________________  Medication Rules  Applies to: All patients receiving prescriptions (written or electronic).  Pharmacy of record: Pharmacy where electronic prescriptions will be sent. If written prescriptions are taken to a different pharmacy, please inform the nursing staff. The pharmacy listed in the electronic medical record should be the one where you would like electronic prescriptions to be sent.  Prescription refills: Only during scheduled appointments. Applies to both, written and electronic prescriptions.  NOTE: The following applies primarily to controlled substances (Opioid Pain Medications)  Patient's responsibilities: 1. Pain Pills: Bring all pain pills to every appointment (except for procedure appointments). 2. Pill Bottles: Bring pills in original pharmacy bottle. Always bring newest bottle. Bring bottle, even if empty. 3. Medication refills: You are responsible for knowing and keeping track of what medications you need refilled. The day before your appointment, write a list of all prescriptions that need to be refilled. Bring that list to your appointment and give it to the admitting nurse. Prescriptions will be written only during appointments. If you forget a medication, it will not be "Called in", "Faxed", or "electronically sent". You will need to get another appointment to get these prescribed. 4. Prescription Accuracy: You are responsible for carefully inspecting your prescriptions before leaving our office. Have the discharge nurse carefully go over each prescription with you, before taking them home. Make sure that your name is accurately spelled, that your address is correct. Check the name and dose of your medication to make sure it is accurate. Check the number of pills, and the written instructions to  make sure they are clear and accurate. Make sure that you are  given enough medication to last until your next medication refill appointment. 5. Taking Medication: Take medication as prescribed. Never take more pills than instructed. Never take medication more frequently than prescribed. Taking less pills or less frequently is permitted and encouraged, when it comes to controlled substances (written prescriptions).  6. Inform other Doctors: Always inform, all of your healthcare providers, of all the medications you take. 7. Pain Medication from other Providers: You are not allowed to accept any additional pain medication from any other Doctor or Healthcare provider. There are two exceptions to this rule. (see below) In the event that you require additional pain medication, you are responsible for notifying us, as stated below. 8. Medication Agreement: You are responsible for carefully reading and following our Medication Agreement. This must be signed before receiving any prescriptions from our practice. Safely store a copy of your signed Agreement. Violations to the Agreement will result in no further prescriptions. (Additional copies of our Medication Agreement are available upon request.) 9. Laws, Rules, & Regulations: All patients are expected to follow all Federal and [employer], Rules, [employer]. Ignorance of the Laws does not constitute a valid excuse.  Exceptions: There are only two exceptions to the rule of not receiving pain medications from other Healthcare Providers. 1. Exception #1 (Emergencies): In the event of an emergency (i.e.: accident requiring emergency care), you are allowed to receive additional pain medication. However, you are responsible for: As soon as you are able, call our office [phone], at any time of the day or night, and leave a message stating your name, the date and nature of the emergency, and the name and dose of the medication prescribed. In the event  that your call is answered by a member of our staff, make sure to document and save the date, time, and the name of the person that took your information.  2. Exception #2 (Planned Surgery): In the event that you are scheduled by another doctor or dentist to have any type of surgery or procedure, you are allowed (for a period no longer than 30 days), to receive additional pain medication, for the acute post-op pain. However, in this case, you are responsible for picking up a copy of our "Post-op Pain Management for Surgeons" handout, and giving it to your surgeon or dentist. This document is available at our office, and does not require an appointment to obtain it. Simply go to our office during business hours (Monday-Thursday from 8:00 AM to 4:00 PM) (Friday 8:00 AM to 12:00 Noon) or if you have a scheduled appointment with [place], prior to your surgery, and ask for it by name. In addition, you will need to provide [place] with your name, name of your surgeon, type of surgery, and date of procedure or surgery.  ____________________________________________________________________________________________

## 2017-02-10 NOTE — Progress Notes (Signed)
Nursing Pain Medication Assessment:  Safety precautions to be maintained throughout the outpatient stay will include: orient to surroundings, keep bed in low position, maintain call bell within reach at all times, provide assistance with transfer out of bed and ambulation.  Medication Inspection Compliance: Cynthia Jones did not comply with our request to bring her pills to be counted. She was reminded that bringing the medication bottles, even when empty, is a requirement.  Medication: None brought in. Pill/Patch Count: None available to be counted. Bottle Appearance: No container available. Did not bring bottle(s) to appointment. Filled Date: N/A Last Medication intake:  Yesterday

## 2017-02-10 NOTE — Patient Instructions (Addendum)

## 2017-02-11 ENCOUNTER — Encounter: Payer: Medicare Other | Admitting: Nurse Practitioner

## 2017-02-16 LAB — TOXASSURE SELECT 13 (MW), URINE

## 2017-03-05 ENCOUNTER — Ambulatory Visit (INDEPENDENT_AMBULATORY_CARE_PROVIDER_SITE_OTHER): Payer: Medicare Other

## 2017-03-05 VITALS — BP 122/60 | HR 64 | Temp 97.8°F | Resp 16 | Ht 60.0 in | Wt 224.4 lb

## 2017-03-05 DIAGNOSIS — Z0000 Encounter for general adult medical examination without abnormal findings: Secondary | ICD-10-CM

## 2017-03-05 NOTE — Patient Instructions (Addendum)
Cynthia Jones , Thank you for taking time to come for your Medicare Wellness Visit. I appreciate your ongoing commitment to your health goals. Please review the following plan we discussed and let me know if I can assist you in the future.   Screening recommendations/referrals: Colonoscopy: Completed 04/22/2012 Mammogram: Due now, call to schedule  Bone Density: Completed 03/26/2014 Recommended yearly ophthalmology/optometry visit for glaucoma screening and checkup Recommended yearly dental visit for hygiene and checkup  Vaccinations: Influenza vaccine: up to date, due 05/2017 Pneumococcal vaccine: up to date Tdap vaccine: up to date Shingles vaccine: due, check with your insurance company for coverage   Advanced directives: Please bring a copy of your health care power of attorney and living will to the office at your convenience.  Conditions/risks identified: Recommend drinking at least 4-5 glasses of water a day  Next appointment: Follow up on 04/12/2017 at 11:00am with Dr.Crissman. Follow up in one year for your annual wellness exam.   Preventive Care 65 Years and Older, Female Preventive care refers to lifestyle choices and visits with your health care provider that can promote health and wellness. What does preventive care include?  A yearly physical exam. This is also called an annual well check.  Dental exams once or twice a year.  Routine eye exams. Ask your health care provider how often you should have your eyes checked.  Personal lifestyle choices, including:  Daily care of your teeth and gums.  Regular physical activity.  Eating a healthy diet.  Avoiding tobacco and drug use.  Limiting alcohol use.  Practicing safe sex.  Taking low-dose aspirin every day.  Taking vitamin and mineral supplements as recommended by your health care provider. What happens during an annual well check? The services and screenings done by your health care provider during your  annual well check will depend on your age, overall health, lifestyle risk factors, and family history of disease. Counseling  Your health care provider may ask you questions about your:  Alcohol use.  Tobacco use.  Drug use.  Emotional well-being.  Home and relationship well-being.  Sexual activity.  Eating habits.  History of falls.  Memory and ability to understand (cognition).  Work and work Astronomer.  Reproductive health. Screening  You may have the following tests or measurements:  Height, weight, and BMI.  Blood pressure.  Lipid and cholesterol levels. These may be checked every 5 years, or more frequently if you are over 29 years old.  Skin check.  Lung cancer screening. You may have this screening every year starting at age 11 if you have a 30-pack-year history of smoking and currently smoke or have quit within the past 15 years.  Fecal occult blood test (FOBT) of the stool. You may have this test every year starting at age 19.  Flexible sigmoidoscopy or colonoscopy. You may have a sigmoidoscopy every 5 years or a colonoscopy every 10 years starting at age 52.  Hepatitis C blood test.  Hepatitis B blood test.  Sexually transmitted disease (STD) testing.  Diabetes screening. This is done by checking your blood sugar (glucose) after you have not eaten for a while (fasting). You may have this done every 1-3 years.  Bone density scan. This is done to screen for osteoporosis. You may have this done starting at age 52.  Mammogram. This may be done every 1-2 years. Talk to your health care provider about how often you should have regular mammograms. Talk with your health care provider about your test  results, treatment options, and if necessary, the need for more tests. Vaccines  Your health care provider may recommend certain vaccines, such as:  Influenza vaccine. This is recommended every year.  Tetanus, diphtheria, and acellular pertussis (Tdap, Td)  vaccine. You may need a Td booster every 10 years.  Zoster vaccine. You may need this after age 23.  Pneumococcal 13-valent conjugate (PCV13) vaccine. One dose is recommended after age 39.  Pneumococcal polysaccharide (PPSV23) vaccine. One dose is recommended after age 64. Talk to your health care provider about which screenings and vaccines you need and how often you need them. This information is not intended to replace advice given to you by your health care provider. Make sure you discuss any questions you have with your health care provider. Document Released: 09/13/2015 Document Revised: 05/06/2016 Document Reviewed: 06/18/2015 Elsevier Interactive Patient Education  2017 [place] Prevention in the Home Falls can cause injuries. They can happen to people of all ages. There are many things you can do to make your home safe and to help prevent falls. What can I do on the outside of my home?  Regularly fix the edges of walkways and driveways and fix any cracks.  Remove anything that might make you trip as you walk through a door, such as a raised step or threshold.  Trim any bushes or trees on the path to your home.  Use bright outdoor lighting.  Clear any walking paths of anything that might make someone trip, such as rocks or tools.  Regularly check to see if handrails are loose or broken. Make sure that both sides of any steps have handrails.  Any raised decks and porches should have guardrails on the edges.  Have any leaves, snow, or ice cleared regularly.  Use sand or salt on walking paths during winter.  Clean up any spills in your garage right away. This includes oil or grease spills. What can I do in the bathroom?  Use night lights.  Install grab bars by the toilet and in the tub and shower. Do not use towel bars as grab bars.  Use non-skid mats or decals in the tub or shower.  If you need to sit down in the shower, use a plastic, non-slip  stool.  Keep the floor dry. Clean up any water that spills on the floor as soon as it happens.  Remove soap buildup in the tub or shower regularly.  Attach bath mats securely with double-sided non-slip rug tape.  Do not have throw rugs and other things on the floor that can make you trip. What can I do in the bedroom?  Use night lights.  Make sure that you have a light by your bed that is easy to reach.  Do not use any sheets or blankets that are too big for your bed. They should not hang down onto the floor.  Have a firm chair that has side arms. You can use this for support while you get dressed.  Do not have throw rugs and other things on the floor that can make you trip. What can I do in the kitchen?  Clean up any spills right away.  Avoid walking on wet floors.  Keep items that you use a lot in easy-to-reach places.  If you need to reach something above you, use a strong step stool that has a grab bar.  Keep electrical cords out of the way.  Do not use floor polish or wax that makes  floors slippery. If you must use wax, use non-skid floor wax.  Do not have throw rugs and other things on the floor that can make you trip. What can I do with my stairs?  Do not leave any items on the stairs.  Make sure that there are handrails on both sides of the stairs and use them. Fix handrails that are broken or loose. Make sure that handrails are as long as the stairways.  Check any carpeting to make sure that it is firmly attached to the stairs. Fix any carpet that is loose or worn.  Avoid having throw rugs at the top or bottom of the stairs. If you do have throw rugs, attach them to the floor with carpet tape.  Make sure that you have a light switch at the top of the stairs and the bottom of the stairs. If you do not have them, ask someone to add them for you. What else can I do to help prevent falls?  Wear shoes that:  Do not have high heels.  Have rubber bottoms.  Are  comfortable and fit you well.  Are closed at the toe. Do not wear sandals.  If you use a stepladder:  Make sure that it is fully opened. Do not climb a closed stepladder.  Make sure that both sides of the stepladder are locked into place.  Ask someone to hold it for you, if possible.  Clearly mark and make sure that you can see:  Any grab bars or handrails.  First and last steps.  Where the edge of each step is.  Use tools that help you move around (mobility aids) if they are needed. These include:  Canes.  Walkers.  Scooters.  Crutches.  Turn on the lights when you go into a dark area. Replace any light bulbs as soon as they burn out.  Set up your furniture so you have a clear path. Avoid moving your furniture around.  If any of your floors are uneven, fix them.  If there are any pets around you, be aware of where they are.  Review your medicines with your doctor. Some medicines can make you feel dizzy. This can increase your chance of falling. Ask your doctor what other things that you can do to help prevent falls. This information is not intended to replace advice given to you by your health care provider. Make sure you discuss any questions you have with your health care provider. Document Released: 06/13/2009 Document Revised: 01/23/2016 Document Reviewed: 09/21/2014 Elsevier Interactive Patient Education  2017 [employer].

## 2017-03-05 NOTE — Progress Notes (Signed)
Subjective:   Cynthia Jones is a 76 y.o. female who presents for Medicare Annual (Subsequent) preventive examination.  Review of Systems:   Cardiac Risk Factors include: hypertension;dyslipidemia;advanced age (>88men, >61 women);obesity (BMI >30kg/m2)     Objective:     Vitals: BP 122/60 (BP Location: Left Arm, Patient Position: Sitting)   Pulse 64   Temp 97.8 F (36.6 C)   Resp 16   Ht 5' (1.524 m)   Wt 224 lb 6.4 oz (101.8 kg)   BMI 43.83 kg/m   Body mass index is 43.83 kg/m.   Tobacco History  Smoking Status  . Never Smoker  Smokeless Tobacco  . Never Used     Counseling given: Not Answered   Past Medical History:  Diagnosis Date  . Acute anxiety 03/06/2015  . Allergy   . Anxiety   . Degenerative joint disease of knee   . Depression   . Fibromyalgia   . Obesity   . Osteopenia   . RAD (reactive airway disease)    Past Surgical History:  Procedure Laterality Date  . ABDOMINAL HYSTERECTOMY    . APPENDECTOMY    . CHOLECYSTECTOMY    . EYE SURGERY    . JOINT REPLACEMENT  2014   right knee  . PAROTIDECTOMY Left   . TONSILLECTOMY     Family History  Problem Relation Age of Onset  . Heart disease Mother   . Stroke Mother   . Diabetes Mother   . Alzheimer's disease Father   . Parkinson's disease Father   . Cancer Sister    History  Sexual Activity  . Sexual activity: Not on file    Outpatient Encounter Prescriptions as of 03/05/2017  Medication Sig  . ALPRAZolam (XANAX) 0.25 MG tablet Take 1 tablet (0.25 mg total) by mouth 2 (two) times daily as needed for anxiety.  . carvedilol (COREG) 12.5 MG tablet Take 1 tablet (12.5 mg total) by mouth 2 (two) times daily with a meal.  . chlorhexidine (HIBICLENS) 4 % external liquid Apply topically daily as needed.  . DULoxetine (CYMBALTA) 60 MG capsule Take 1 capsule (60 mg total) by mouth daily.  [name] esomeprazole (NEXIUM) 40 MG capsule Take 40 mg by mouth daily at 12 noon.  [name] glucose blood (IGLUCOSE TEST  STRIPS) test strip Use as instructed - freestyle insulin x strip  . losartan (COZAAR) 100 MG tablet Take 1 tablet (100 mg total) by mouth daily.  [name] nystatin (NYSTATIN) powder Apply topically 4 (four) times daily.  [name] nystatin cream (MYCOSTATIN) Apply 1 application topically 2 (two) times daily.  [name] oxyCODONE (OXY IR/ROXICODONE) 5 MG immediate release tablet Take 1 tablet (5 mg total) by mouth 2 (two) times daily.  Melene Muller ON 04/22/2017] oxyCODONE (OXY IR/ROXICODONE) 5 MG immediate release tablet Take 1 tablet (5 mg total) by mouth 2 (two) times daily. (Patient not taking: Reported on 03/05/2017)  . [START ON 03/23/2017] oxyCODONE (OXY IR/ROXICODONE) 5 MG immediate release tablet Take 1 tablet (5 mg total) by mouth 2 (two) times daily. (Patient not taking: Reported on 03/05/2017)   No facility-administered encounter medications on file as of 03/05/2017.     Activities of Daily Living In your present state of health, do you have any difficulty performing the following activities: 03/05/2017 04/08/2016  Hearing? N N  Vision? N N  Difficulty concentrating or making decisions? N N  Walking or climbing stairs? Y Y  Dressing or bathing? N N  Doing errands, shopping? N N  Preparing Food and eating ? N -  Using the Toilet? N -  In the past six months, have you accidently leaked urine? N -  Do you have problems with loss of bowel control? N -  Managing your Medications? N -  Managing your Finances? N -  Housekeeping or managing your Housekeeping? N -  Some recent data might be hidden    Patient Care Team: Steele Sizer, MD as PCP - General (Family Medicine)    Assessment:     Exercise Activities and Dietary recommendations Current Exercise Habits: The patient does not participate in regular exercise at present, Exercise limited by: orthopedic condition(s)  Goals    . Increase water intake          Recommend drinking at least 4-5 glasses of water a day      Fall Risk Fall Risk  03/05/2017  02/10/2017 12/30/2016 12/16/2016 12/02/2016  Falls in the past year? No No No No No  Number falls in past yr: - - - - -  Injury with Fall? - - - - -  Risk Factor Category  - - - - -  Risk for fall due to : - - - - -  Risk for fall due to (comments): - - - - -  Follow up - - - - -   Depression Screen PHQ 2/9 Scores 03/05/2017 02/10/2017 12/30/2016 12/16/2016  PHQ - 2 Score 0 0 0 0  PHQ- 9 Score - - - -  Exception Documentation - - - -     Cognitive Function     6CIT Screen 03/05/2017  What Year? 0 points  What month? 0 points  What time? 0 points  Count back from 20 0 points  Months in reverse 0 points  Repeat phrase 2 points  Total Score 2    Immunization History  Administered Date(s) Administered  . Influenza, High Dose Seasonal PF 06/10/2016  . Influenza,inj,Quad PF,36+ Mos 06/11/2015  . Pneumococcal Conjugate-13 03/05/2014  . Pneumococcal Polysaccharide-23 01/19/2008  . Td 01/19/2008   Screening Tests Health Maintenance  Topic Date Due  . MAMMOGRAM  04/02/2017 (Originally 03/26/2016)  . FOOT EXAM  04/12/2017 (Originally 06/06/2015)  . OPHTHALMOLOGY EXAM  04/12/2017 (Originally 12/18/2015)  . INFLUENZA VACCINE  03/31/2017  . HEMOGLOBIN A1C  06/03/2017  . TETANUS/TDAP  01/18/2018  . COLONOSCOPY  04/22/2022  . DEXA SCAN  Completed  . PNA vac Low Risk Adult  Completed      Plan:     I have personally reviewed and addressed the Medicare Annual Wellness questionnaire and have noted the following in the patient's chart:  A. Medical and social history B. Use of alcohol, tobacco or illicit drugs  C. Current medications and supplements D. Functional ability and status E.  Nutritional status F.  Physical activity G. Advance directives H. List of other physicians I.  Hospitalizations, surgeries, and ER visits in previous 12 months J.  Vitals K. Screenings such as hearing and vision if needed, cognitive and depression L. Referrals and appointments   In addition, I have  reviewed and discussed with patient certain preventive protocols, quality metrics, and best practice recommendations. A written personalized care plan for preventive services as well as general preventive health recommendations were provided to patient.   Signed,  Marin Roberts, LPN Nurse Health Advisor   MD Recommendations: Due for diabetic foot exam.

## 2017-04-05 ENCOUNTER — Ambulatory Visit: Payer: Medicare Other | Admitting: Family Medicine

## 2017-04-12 ENCOUNTER — Ambulatory Visit (INDEPENDENT_AMBULATORY_CARE_PROVIDER_SITE_OTHER): Payer: Medicare Other | Admitting: Family Medicine

## 2017-04-12 VITALS — BP 136/83 | HR 88 | Wt 223.0 lb

## 2017-04-12 DIAGNOSIS — E118 Type 2 diabetes mellitus with unspecified complications: Secondary | ICD-10-CM | POA: Diagnosis not present

## 2017-04-12 DIAGNOSIS — Z7189 Other specified counseling: Secondary | ICD-10-CM

## 2017-04-12 DIAGNOSIS — Z1329 Encounter for screening for other suspected endocrine disorder: Secondary | ICD-10-CM

## 2017-04-12 DIAGNOSIS — F3342 Major depressive disorder, recurrent, in full remission: Secondary | ICD-10-CM

## 2017-04-12 DIAGNOSIS — I10 Essential (primary) hypertension: Secondary | ICD-10-CM | POA: Diagnosis not present

## 2017-04-12 DIAGNOSIS — Z0000 Encounter for general adult medical examination without abnormal findings: Secondary | ICD-10-CM

## 2017-04-12 DIAGNOSIS — F419 Anxiety disorder, unspecified: Secondary | ICD-10-CM | POA: Diagnosis not present

## 2017-04-12 DIAGNOSIS — Z1239 Encounter for other screening for malignant neoplasm of breast: Secondary | ICD-10-CM

## 2017-04-12 DIAGNOSIS — E7800 Pure hypercholesterolemia, unspecified: Secondary | ICD-10-CM

## 2017-04-12 LAB — URINALYSIS, ROUTINE W REFLEX MICROSCOPIC
BILIRUBIN UA: NEGATIVE
GLUCOSE, UA: NEGATIVE
KETONES UA: NEGATIVE
Leukocytes, UA: NEGATIVE
NITRITE UA: NEGATIVE
Protein, UA: NEGATIVE
RBC, UA: NEGATIVE
Specific Gravity, UA: 1.02 (ref 1.005–1.030)
UUROB: 0.2 mg/dL (ref 0.2–1.0)
pH, UA: 5.5 (ref 5.0–7.5)

## 2017-04-12 LAB — BAYER DCA HB A1C WAIVED: HB A1C: 6.2 % (ref <7.0)

## 2017-04-12 MED ORDER — NYSTATIN 100000 UNIT/GM EX POWD: Freq: Four times a day (QID) | CUTANEOUS | 4 refills | Status: DC

## 2017-04-12 MED ORDER — DULOXETINE HCL 60 MG PO CPEP: 60.0000 mg | ORAL_CAPSULE | Freq: Every day | ORAL | 4 refills | Status: DC

## 2017-04-12 MED ORDER — CARVEDILOL 12.5 MG PO TABS: 12.5000 mg | ORAL_TABLET | Freq: Two times a day (BID) | ORAL | 4 refills | Status: DC

## 2017-04-12 MED ORDER — LOSARTAN POTASSIUM 100 MG PO TABS: 100.0000 mg | ORAL_TABLET | Freq: Every day | ORAL | 4 refills | Status: DC

## 2017-04-12 MED ORDER — ALPRAZOLAM 0.25 MG PO TABS: 0.2500 mg | ORAL_TABLET | Freq: Two times a day (BID) | ORAL | 4 refills | Status: DC | PRN

## 2017-04-12 NOTE — Progress Notes (Signed)
Pulse 88   SpO2 98%    Subjective:    Patient ID: Cynthia Jones, female    DOB: 03/18/1941, 76 y.o.   MRN: [id number]  HPI: Cynthia Jones is a 76 y.o. female  Chief Complaint  Patient presents with  . Annual Exam    Saw Tiffany for AWV   Patient with a lot going on in her life with unexpected death's and illnesses. Uses Xanax sparingly and rarely. Is working with pain clinic for pain control but has no real energy is just tired and draggy. Pains only modestly controlled. Other medications are stable. With blood pressure Not taking any diabetes medications. Patient does check her blood sugars  Relevant past medical, surgical, family and social history reviewed and updated as indicated. Interim medical history since our last visit reviewed. Allergies and medications reviewed and updated.  Review of Systems  Constitutional: Negative.   HENT: Negative.   Eyes: Negative.   Respiratory: Negative.   Cardiovascular: Negative.   Gastrointestinal: Negative.   Endocrine: Negative.   Genitourinary: Negative.   Musculoskeletal: Negative.   Skin: Negative.   Allergic/Immunologic: Negative.   Neurological: Negative.   Hematological: Negative.   Psychiatric/Behavioral: Negative.     Per HPI unless specifically indicated above     Objective:    Pulse 88   SpO2 98%   Wt Readings from Last 3 Encounters:  03/05/17 224 lb 6.4 oz (101.8 kg)  02/10/17 222 lb (100.7 kg)  02/03/17 222 lb 9.6 oz (101 kg)    Physical Exam  Constitutional: She is oriented to person, place, and time. She appears well-developed and well-nourished.  HENT:  Head: Normocephalic and atraumatic.  Right Ear: External ear normal.  Left Ear: External ear normal.  Nose: Nose normal.  Mouth/Throat: Oropharynx is clear and moist.  Eyes: Pupils are equal, round, and reactive to light. Conjunctivae and EOM are normal.  Neck: Normal range of motion. Neck supple. Carotid bruit is not present.    Cardiovascular: Normal rate, regular rhythm and normal heart sounds.   No murmur heard. Pulmonary/Chest: Effort normal and breath sounds normal. She exhibits no mass. Right breast exhibits no mass, no skin change and no tenderness. Left breast exhibits no mass, no skin change and no tenderness. Breasts are symmetrical.  Abdominal: Soft. Bowel sounds are normal. There is no hepatosplenomegaly.  Musculoskeletal: Normal range of motion.  Neurological: She is alert and oriented to person, place, and time.  Skin: No rash noted.  Psychiatric: She has a normal mood and affect. Her behavior is normal. Judgment and thought content normal.    Results for orders placed or performed in visit on 02/10/17  ToxASSURE Select 13 (MW), Urine  Result Value Ref Range   Summary FINAL       Assessment & Plan:   Problem List Items Addressed This Visit      Cardiovascular and Mediastinum   Essential hypertension   Relevant Medications   carvedilol (COREG) 12.5 MG tablet   losartan (COZAAR) 100 MG tablet   Other Relevant Orders   CBC with Differential/Platelet     Endocrine   Diabetes mellitus (HCC)   Relevant Medications   losartan (COZAAR) 100 MG tablet   nystatin (NYSTATIN) powder   Other Relevant Orders   Comprehensive metabolic panel   Urinalysis, Routine w reflex microscopic   Bayer DCA Hb A1c Waived     Other   Depression   Relevant Medications   ALPRAZolam (XANAX) 0.25 MG tablet  DULoxetine (CYMBALTA) 60 MG capsule   Hypercholesteremia   Relevant Medications   carvedilol (COREG) 12.5 MG tablet   losartan (COZAAR) 100 MG tablet   Other Relevant Orders   Lipid panel   Advanced care planning/counseling discussion    A voluntary discussion about advance care planning including the explanation and discussion of advance directives was extensively discussed  with the patient.  Explanation about the health care proxy and Living will was reviewed and packet with forms with explanation of  how to fill them out was given.  Will bring copies.       Other Visit Diagnoses    Annual physical exam    -  Primary   Thyroid disorder screen       Relevant Orders   TSH   Breast cancer screening       Relevant Orders   MM DIGITAL SCREENING BILATERAL   Anxiety       Relevant Medications   ALPRAZolam (XANAX) 0.25 MG tablet   DULoxetine (CYMBALTA) 60 MG capsule       Follow up plan: Return for Hemoglobin A1c, BMP,  Lipids, ALT, AST.

## 2017-04-12 NOTE — Assessment & Plan Note (Signed)
A voluntary discussion about advance care planning including the explanation and discussion of advance directives was extensively discussed  with the patient.  Explanation about the health care proxy and Living will was reviewed and packet with forms with explanation of how to fill them out was given.  Will bring copies. 

## 2017-04-13 ENCOUNTER — Encounter: Payer: Self-pay | Admitting: Family Medicine

## 2017-04-13 LAB — LIPID PANEL
CHOL/HDL RATIO: 2.9 ratio (ref 0.0–4.4)
CHOLESTEROL TOTAL: 190 mg/dL (ref 100–199)
HDL: 65 mg/dL (ref >39)
LDL CALC: 101 mg/dL — AB (ref 0–99)
TRIGLYCERIDES: 118 mg/dL (ref 0–149)
VLDL CHOLESTEROL CAL: 24 mg/dL (ref 5–40)

## 2017-04-13 LAB — COMPREHENSIVE METABOLIC PANEL
ALT: 13 IU/L (ref 0–32)
AST: 16 IU/L (ref 0–40)
Albumin/Globulin Ratio: 1.3 (ref 1.2–2.2)
Albumin: 4.1 g/dL (ref 3.5–4.8)
Alkaline Phosphatase: 49 IU/L (ref 39–117)
BUN/Creatinine Ratio: 14 (ref 12–28)
BUN: 12 mg/dL (ref 8–27)
Bilirubin Total: 0.4 mg/dL (ref 0.0–1.2)
CALCIUM: 9.6 mg/dL (ref 8.7–10.3)
CO2: 22 mmol/L (ref 20–29)
CREATININE: 0.88 mg/dL (ref 0.57–1.00)
Chloride: 103 mmol/L (ref 96–106)
GFR calc Af Amer: 75 mL/min/{1.73_m2} (ref >59)
GFR, EST NON AFRICAN AMERICAN: 65 mL/min/{1.73_m2} (ref >59)
GLOBULIN, TOTAL: 3.1 g/dL (ref 1.5–4.5)
Glucose: 136 mg/dL — ABNORMAL HIGH (ref 65–99)
Potassium: 4.2 mmol/L (ref 3.5–5.2)
SODIUM: 139 mmol/L (ref 134–144)
TOTAL PROTEIN: 7.2 g/dL (ref 6.0–8.5)

## 2017-04-13 LAB — CBC WITH DIFFERENTIAL/PLATELET
Basophils Absolute: 0 10*3/uL (ref 0.0–0.2)
Basos: 1 %
EOS (ABSOLUTE): 0.2 10*3/uL (ref 0.0–0.4)
EOS: 2 %
HEMATOCRIT: 39.9 % (ref 34.0–46.6)
Hemoglobin: 13.2 g/dL (ref 11.1–15.9)
IMMATURE GRANS (ABS): 0 10*3/uL (ref 0.0–0.1)
IMMATURE GRANULOCYTES: 0 %
LYMPHS: 24 %
Lymphocytes Absolute: 1.8 10*3/uL (ref 0.7–3.1)
MCH: 30.8 pg (ref 26.6–33.0)
MCHC: 33.1 g/dL (ref 31.5–35.7)
MCV: 93 fL (ref 79–97)
MONOCYTES: 8 %
MONOS ABS: 0.6 10*3/uL (ref 0.1–0.9)
NEUTROS PCT: 65 %
Neutrophils Absolute: 5.1 10*3/uL (ref 1.4–7.0)
Platelets: 328 10*3/uL (ref 150–379)
RBC: 4.29 x10E6/uL (ref 3.77–5.28)
RDW: 13.3 % (ref 12.3–15.4)
WBC: 7.7 10*3/uL (ref 3.4–10.8)

## 2017-04-13 LAB — TSH: TSH: 5.61 u[IU]/mL — ABNORMAL HIGH (ref 0.450–4.500)

## 2017-04-19 ENCOUNTER — Other Ambulatory Visit: Payer: Self-pay | Admitting: Family Medicine

## 2017-05-12 ENCOUNTER — Encounter: Payer: Self-pay | Admitting: Nurse Practitioner

## 2017-05-12 ENCOUNTER — Ambulatory Visit: Payer: Medicare Other | Attending: Nurse Practitioner | Admitting: Nurse Practitioner

## 2017-05-12 VITALS — BP 139/39 | HR 66 | Temp 98.1°F | Resp 16 | Ht 60.0 in | Wt 222.0 lb

## 2017-05-12 DIAGNOSIS — M8580 Other specified disorders of bone density and structure, unspecified site: Secondary | ICD-10-CM | POA: Diagnosis not present

## 2017-05-12 DIAGNOSIS — G4700 Insomnia, unspecified: Secondary | ICD-10-CM | POA: Diagnosis not present

## 2017-05-12 DIAGNOSIS — M545 Low back pain, unspecified: Secondary | ICD-10-CM

## 2017-05-12 DIAGNOSIS — E7800 Pure hypercholesterolemia, unspecified: Secondary | ICD-10-CM | POA: Diagnosis not present

## 2017-05-12 DIAGNOSIS — M25552 Pain in left hip: Secondary | ICD-10-CM | POA: Insufficient documentation

## 2017-05-12 DIAGNOSIS — E1140 Type 2 diabetes mellitus with diabetic neuropathy, unspecified: Secondary | ICD-10-CM | POA: Insufficient documentation

## 2017-05-12 DIAGNOSIS — F329 Major depressive disorder, single episode, unspecified: Secondary | ICD-10-CM | POA: Insufficient documentation

## 2017-05-12 DIAGNOSIS — Z79899 Other long term (current) drug therapy: Secondary | ICD-10-CM | POA: Insufficient documentation

## 2017-05-12 DIAGNOSIS — L304 Erythema intertrigo: Secondary | ICD-10-CM | POA: Diagnosis not present

## 2017-05-12 DIAGNOSIS — Z90710 Acquired absence of both cervix and uterus: Secondary | ICD-10-CM | POA: Insufficient documentation

## 2017-05-12 DIAGNOSIS — Z833 Family history of diabetes mellitus: Secondary | ICD-10-CM | POA: Insufficient documentation

## 2017-05-12 DIAGNOSIS — M79643 Pain in unspecified hand: Secondary | ICD-10-CM | POA: Insufficient documentation

## 2017-05-12 DIAGNOSIS — M2550 Pain in unspecified joint: Secondary | ICD-10-CM | POA: Diagnosis not present

## 2017-05-12 DIAGNOSIS — M159 Polyosteoarthritis, unspecified: Secondary | ICD-10-CM

## 2017-05-12 DIAGNOSIS — E669 Obesity, unspecified: Secondary | ICD-10-CM | POA: Insufficient documentation

## 2017-05-12 DIAGNOSIS — M797 Fibromyalgia: Secondary | ICD-10-CM | POA: Diagnosis not present

## 2017-05-12 DIAGNOSIS — Z9049 Acquired absence of other specified parts of digestive tract: Secondary | ICD-10-CM | POA: Insufficient documentation

## 2017-05-12 DIAGNOSIS — Z5181 Encounter for therapeutic drug level monitoring: Secondary | ICD-10-CM | POA: Diagnosis not present

## 2017-05-12 DIAGNOSIS — Z794 Long term (current) use of insulin: Secondary | ICD-10-CM | POA: Insufficient documentation

## 2017-05-12 DIAGNOSIS — I10 Essential (primary) hypertension: Secondary | ICD-10-CM | POA: Diagnosis not present

## 2017-05-12 DIAGNOSIS — M1990 Unspecified osteoarthritis, unspecified site: Secondary | ICD-10-CM | POA: Diagnosis not present

## 2017-05-12 DIAGNOSIS — R51 Headache: Secondary | ICD-10-CM | POA: Diagnosis not present

## 2017-05-12 DIAGNOSIS — G8929 Other chronic pain: Secondary | ICD-10-CM

## 2017-05-12 DIAGNOSIS — M150 Primary generalized (osteo)arthritis: Secondary | ICD-10-CM | POA: Diagnosis not present

## 2017-05-12 DIAGNOSIS — J45909 Unspecified asthma, uncomplicated: Secondary | ICD-10-CM | POA: Insufficient documentation

## 2017-05-12 DIAGNOSIS — M47816 Spondylosis without myelopathy or radiculopathy, lumbar region: Secondary | ICD-10-CM

## 2017-05-12 DIAGNOSIS — M25551 Pain in right hip: Secondary | ICD-10-CM | POA: Insufficient documentation

## 2017-05-12 DIAGNOSIS — Z6841 Body Mass Index (BMI) 40.0 and over, adult: Secondary | ICD-10-CM | POA: Insufficient documentation

## 2017-05-12 DIAGNOSIS — Z809 Family history of malignant neoplasm, unspecified: Secondary | ICD-10-CM | POA: Insufficient documentation

## 2017-05-12 DIAGNOSIS — G894 Chronic pain syndrome: Secondary | ICD-10-CM | POA: Insufficient documentation

## 2017-05-12 DIAGNOSIS — Z9889 Other specified postprocedural states: Secondary | ICD-10-CM | POA: Insufficient documentation

## 2017-05-12 DIAGNOSIS — Z823 Family history of stroke: Secondary | ICD-10-CM | POA: Insufficient documentation

## 2017-05-12 MED ORDER — OXYCODONE HCL 5 MG PO TABS: 5.0000 mg | ORAL_TABLET | Freq: Two times a day (BID) | ORAL | 0 refills | Status: DC

## 2017-05-12 NOTE — Progress Notes (Signed)
Nursing Pain Medication Assessment:  Safety precautions to be maintained throughout the outpatient stay will include: orient to surroundings, keep bed in low position, maintain call bell within reach at all times, provide assistance with transfer out of bed and ambulation.  Medication Inspection Compliance: Pill count conducted under aseptic conditions, in front of the patient. Neither the pills nor the bottle was removed from the patient's sight at any time. Once count was completed pills were immediately returned to the patient in their original bottle.  Medication: Oxycodone IR Pill/Patch Count: 22 of 60 pills remain Pill/Patch Appearance: Markings consistent with prescribed medication Bottle Appearance: Standard pharmacy container. Clearly labeled. Filled Date: 08 / 25 / 2018 Last Medication intake:  Yesterday

## 2017-05-12 NOTE — Patient Instructions (Addendum)
____________________________________________________________________________________________  Medication Rules  Applies to: All patients receiving prescriptions (written or electronic).  Pharmacy of record: Pharmacy where electronic prescriptions will be sent. If written prescriptions are taken to a different pharmacy, please inform the nursing staff. The pharmacy listed in the electronic medical record should be the one where you would like electronic prescriptions to be sent.  Prescription refills: Only during scheduled appointments. Applies to both, written and electronic prescriptions.  NOTE: The following applies primarily to controlled substances (Opioid* Pain Medications).   Patient's responsibilities: 1. Pain Pills: Bring all pain pills to every appointment (except for procedure appointments). 2. Pill Bottles: Bring pills in original pharmacy bottle. Always bring newest bottle. Bring bottle, even if empty. 3. Medication refills: You are responsible for knowing and keeping track of what medications you need refilled. The day before your appointment, write a list of all prescriptions that need to be refilled. Bring that list to your appointment and give it to the admitting nurse. Prescriptions will be written only during appointments. If you forget a medication, it will not be "Called in", "Faxed", or "electronically sent". You will need to get another appointment to get these prescribed. 4. Prescription Accuracy: You are responsible for carefully inspecting your prescriptions before leaving our office. Have the discharge nurse carefully go over each prescription with you, before taking them home. Make sure that your name is accurately spelled, that your address is correct. Check the name and dose of your medication to make sure it is accurate. Check the number of pills, and the written instructions to make sure they are clear and accurate. Make sure that you are given enough medication to  last until your next medication refill appointment. 5. Taking Medication: Take medication as prescribed. Never take more pills than instructed. Never take medication more frequently than prescribed. Taking less pills or less frequently is permitted and encouraged, when it comes to controlled substances (written prescriptions).  6. Inform other Doctors: Always inform, all of your healthcare providers, of all the medications you take. 7. Pain Medication from other Providers: You are not allowed to accept any additional pain medication from any other Doctor or Healthcare provider. There are two exceptions to this rule. (see below) In the event that you require additional pain medication, you are responsible for notifying us, as stated below. 8. Medication Agreement: You are responsible for carefully reading and following our Medication Agreement. This must be signed before receiving any prescriptions from our practice. Safely store a copy of your signed Agreement. Violations to the Agreement will result in no further prescriptions. (Additional copies of our Medication Agreement are available upon request.) 9. Laws, Rules, & Regulations: All patients are expected to follow all Federal and State Laws, Statutes, Rules, & Regulations. Ignorance of the Laws does not constitute a valid excuse. The use of any illegal substances is prohibited. 10. Adopted CDC guidelines & recommendations: Target dosing levels will be at or below 60 MME/day. Use of benzodiazepines** is not recommended.  Exceptions: There are only two exceptions to the rule of not receiving pain medications from other Healthcare Providers. 1. Exception #1 (Emergencies): In the event of an emergency (i.e.: accident requiring emergency care), you are allowed to receive additional pain medication. However, you are responsible for: As soon as you are able, call our office [phone], at any time of the day or night, and leave a message stating your  name, the date and nature of the emergency, and the name and dose of the medication   prescribed. In the event that your call is answered by a member of our staff, make sure to document and save the date, time, and the name of the person that took your information.  2. Exception #2 (Planned Surgery): In the event that you are scheduled by another doctor or dentist to have any type of surgery or procedure, you are allowed (for a period no longer than 30 days), to receive additional pain medication, for the acute post-op pain. However, in this case, you are responsible for picking up a copy of our "Post-op Pain Management for Surgeons" handout, and giving it to your surgeon or dentist. This document is available at our office, and does not require an appointment to obtain it. Simply go to our office during business hours (Monday-Thursday from 8:00 AM to 4:00 PM) (Friday 8:00 AM to 12:00 Noon) or if you have a scheduled appointment with [place], prior to your surgery, and ask for it by name. In addition, you will need to provide [place] with your name, name of your surgeon, type of surgery, and date of procedure or surgery.  *Opioid medications include: morphine, codeine, oxycodone, oxymorphone, hydrocodone, hydromorphone, meperidine, tramadol, tapentadol, buprenorphine, fentanyl, methadone. **Benzodiazepine medications include: diazepam (Valium), alprazolam (Xanax), clonazepam (Klonopine), lorazepam (Ativan), clorazepate (Tranxene), chlordiazepoxide (Librium), estazolam (Prosom), oxazepam (Serax), temazepam (Restoril), triazolam (Halcion)  ____________________________________________________________________________________________  BMI Assessment: Estimated body mass index is 43.36 kg/m as calculated from the following:   Height as of this encounter: 5' (1.524 m).   Weight as of this encounter: 222 lb (100.7 kg).  BMI interpretation table: BMI level Category Range association with higher incidence of chronic pain   <18 kg/m2 Underweight   18.5-24.9 kg/m2 Ideal body weight   25-29.9 kg/m2 Overweight Increased incidence by 20%  30-34.9 kg/m2 Obese (Class I) Increased incidence by 68%  35-39.9 kg/m2 Severe obesity (Class II) Increased incidence by 136%  >40 kg/m2 Extreme obesity (Class III) Increased incidence by 254%   BMI Readings from Last 4 Encounters:  05/12/17 43.36 kg/m  04/12/17 43.55 kg/m  03/05/17 43.83 kg/m  02/10/17 43.36 kg/m   Wt Readings from Last 4 Encounters:  05/12/17 222 lb (100.7 kg)  04/12/17 223 lb (101.2 kg)  03/05/17 224 lb 6.4 oz (101.8 kg)  02/10/17 222 lb (100.7 kg)

## 2017-05-12 NOTE — Progress Notes (Signed)
Patient's Name: Cynthia Jones  MRN: [id number]  Referring Provider: Guadalupe Maple, MD  DOB: 01/19/1941  PCP: Guadalupe Maple, MD  DOS: 05/12/2017  Note by: Vevelyn Francois NP  Service setting: Ambulatory outpatient  Specialty: Interventional Pain Management  Location: [facility] (AMB) Pain Management Facility    Patient type: Established    Primary Reason(s) for Visit: Encounter for prescription drug management. (Level of risk: moderate)  CC: Joint Pain (generalized aches and pains over the body) and Back Pain (lower and left is worse)  HPI  [name] is a 76 y.o. year old, female patient, who comes today for a medication management evaluation. She has Osteopenia; Depression; Essential hypertension; Diabetes mellitus ([place]); Osteoarthritis; Seasonal allergic rhinitis; Obesity; Intertrigo; Fibromyalgia; Bilateral occipital neuralgia; Greater trochanteric bursitis; Osteoarthritis of knee (Location of Primary Source of Pain) (Left); Sacroiliac joint disease; Lumbar facet syndrome (Bilateral) (L>R); Neuropathy due to secondary diabetes ([place]); S/P TKR Arthroplasty (Right); Folliculitis; Insomnia; Long term prescription opiate use; Long term prescription benzodiazepine use; Chronic pain syndrome; Chronic low back pain (Location of Secondary source of pain) (Bilateral) (L>R); Chronic knee pain (Location of Primary Source of Pain) (Bilateral) (L>R); Cervicogenic headache; Chronic hip pain (Bilateral); Chronic hand pain (Bilateral) (R>L); Chronic foot pain (Left); Hypercholesteremia; Lumbar spondylosis; and Advanced care planning/counseling discussion on her problem list. Her primarily concern today is the Joint Pain (generalized aches and pains over the body) and Back Pain (lower and left is worse)  Pain Assessment: Location: (S) Lower, Left, Right (left back is worse.  joint pain over body ) Back Radiating: na Onset: More than a month ago Duration: Chronic pain Quality: Discomfort, Sharp, Nagging,  Sore Severity: 8 /10 (self-reported pain score)  Note: Reported level is compatible with observation.                   Effect on ADL: unable to stand for long periods of time, not able to walk very far  Timing: Constant Modifying factors: pain medications, ice and heat  [name] was last scheduled for an appointment on 02/10/2017 for medication management. During today's appointment we reviewed [name]'s chronic pain status, as well as her outpatient medication regimen. She continues to have generalized joint pain. She admits that the hand pain and back pain are the worse. She wonders if the medication is truly effective but she does not want to not have it.   The patient  reports that she does not use drugs. Her body mass index is 43.36 kg/m.  Further details on both, my assessment(s), as well as the proposed treatment plan, please see below.  Controlled Substance Pharmacotherapy Assessment REMS (Risk Evaluation and Mitigation Strategy)  Analgesic:Oxycodone IR 5 mg 1 tablet by mouth twice a day MME/day:'15mg'$ /day .  Janett Billow, RN  05/12/2017  2:21 PM  Sign at close encounter Nursing Pain Medication Assessment:  Safety precautions to be maintained throughout the outpatient stay will include: orient to surroundings, keep bed in low position, maintain call bell within reach at all times, provide assistance with transfer out of bed and ambulation.  Medication Inspection Compliance: Pill count conducted under aseptic conditions, in front of the patient. Neither the pills nor the bottle was removed from the patient's sight at any time. Once count was completed pills were immediately returned to the patient in their original bottle.  Medication: Oxycodone IR Pill/Patch Count: 22 of 60 pills remain Pill/Patch Appearance: Markings consistent with prescribed medication Bottle Appearance: Standard pharmacy container. Clearly labeled.  Filled Date: 08 / 25 / 2018 Last Medication  intake:  Yesterday   Pharmacokinetics: Liberation and absorption (onset of action): WNL Distribution (time to peak effect): WNL Metabolism and excretion (duration of action): WNL         Pharmacodynamics: Desired effects: Analgesia: [name] reports >50% benefit. Functional ability: Patient reports that medication allows her to accomplish basic ADLs Clinically meaningful improvement in function (CMIF): Sustained CMIF goals met Perceived effectiveness: Described as relatively effective, allowing for increase in activities of daily living (ADL) Undesirable effects: Side-effects or Adverse reactions: None reported Monitoring: [place] PMP: Online review of the past 106-monthperiod conducted. Compliant with practice rules and regulations List of all UDS test(s) done:  Lab Results  Component Value Date   TCabanFINAL 02/06/2016   SUMMARY FINAL 02/10/2017   SUMMARY FINAL 09/08/2016   Last UDS on record: [employer]ox[employer] Select 13  Date Value Ref Range Status  02/06/2016 FINAL  Final    Comment:    ==================================================================== TOXASSURE SELECT 13 (MW) ==================================================================== Test                             Result       Flag       Units Drug Present and Declared for Prescription Verification   Oxycodone                      650          EXPECTED   ng/mg creat   Noroxycodone                   1312         EXPECTED   ng/mg creat    Sources of oxycodone include scheduled prescription medications.    Noroxycodone is an expected metabolite of oxycodone. ==================================================================== Test                      Result    Flag   Units      Ref Range   Creatinine              98               mg/dL      >=20 ==================================================================== Declared Medications:  The flagging and interpretation on this report are based on the  following  declared medications.  Unexpected results may arise from  inaccuracies in the declared medications.  **Note: The testing scope of this panel includes these medications:  Oxycodone (Roxicodone)  **Note: The testing scope of this panel does not include following  reported medications:  Carvedilol  Ciprofloxacin  Cyproheptadine  Duloxetine  Fluconazole  Losartan (Losartan Potassium)  Methylprednisolone  Nystatin  Omeprazole ==================================================================== For clinical consultation, please call [phone] ====================================================================    Summary  Date Value Ref Range Status  02/10/2017 FINAL  Final    Comment:    ==================================================================== TOXASSURE SELECT 13 (MW) ==================================================================== Test                             Result       Flag       Units Drug Present and Declared for Prescription Verification   Oxycodone                      141  EXPECTED   ng/mg creat   Noroxycodone                   938          EXPECTED   ng/mg creat    Sources of oxycodone include scheduled prescription medications.    Noroxycodone is an expected metabolite of oxycodone. Drug Absent but Declared for Prescription Verification   Alprazolam                     Not Detected UNEXPECTED ng/mg creat ==================================================================== Test                      Result    Flag   Units      Ref Range   Creatinine              146              mg/dL      >=20 ==================================================================== Declared Medications:  The flagging and interpretation on this report are based on the  following declared medications.  Unexpected results may arise from  inaccuracies in the declared medications.  **Note: The testing scope of this panel includes these medications:   Alprazolam  Oxycodone  **Note: The testing scope of this panel does not include following  reported medications:  Carvedilol  Chlorhexidine  Duloxetine  Esomeprazole  Losartan (Losartan Potassium)  Nystatin ==================================================================== For clinical consultation, please call [phone]. ====================================================================    UDS interpretation: Compliant          Medication Assessment Form: Reviewed. Patient indicates being compliant with therapy Treatment compliance: Compliant Risk Assessment Profile: Aberrant behavior: See prior evaluations. None observed or detected today Comorbid factors increasing risk of overdose: See prior notes. No additional risks detected today Risk of substance use disorder (SUD): Low     Opioid Risk Tool - 05/12/17 1410      Family History of Substance Abuse   Alcohol Negative   Illegal Drugs Negative   Rx Drugs Negative     Personal History of Substance Abuse   Alcohol Negative   Illegal Drugs Negative   Rx Drugs Negative     Psychological Disease   Psychological Disease Negative   Depression Positive  situation depression over the death of her niece and nephew.  took Xanax 0.25 mg a couple of times during this time.      Total Score   Opioid Risk Tool Scoring 1   Opioid Risk Interpretation Low Risk     ORT Scoring interpretation table:  Score <3 = Low Risk for SUD  Score between 4-7 = Moderate Risk for SUD  Score >8 = High Risk for Opioid Abuse   Risk Mitigation Strategies:  Patient Counseling: Covered Patient-Prescriber Agreement (PPA): Present and active  Notification to other healthcare providers: Done  Pharmacologic Plan: No change in therapy, at this time  Laboratory Chemistry  Inflammation Markers (CRP: Acute Phase) (ESR: Chronic Phase) Lab Results  Component Value Date   CRP 1.1 (H) 09/24/2016   ESRSEDRATE 44 (H) 09/24/2016                  Renal Function Markers Lab Results  Component Value Date   BUN 12 04/12/2017   CREATININE 0.88 04/12/2017   GFRAA 75 04/12/2017   GFRNONAA 65 04/12/2017                 Hepatic Function Markers Lab Results  Component Value  Date   AST 16 04/12/2017   ALT 13 04/12/2017   ALBUMIN 4.1 04/12/2017   ALKPHOS 49 04/12/2017                 Electrolytes Lab Results  Component Value Date   NA 139 04/12/2017   K 4.2 04/12/2017   CL 103 04/12/2017   CALCIUM 9.6 04/12/2017   MG 2.1 09/24/2016                 Neuropathy Markers Lab Results  Component Value Date   VITAMINB12 344 09/24/2016                 Bone Pathology Markers Lab Results  Component Value Date   ALKPHOS 49 04/12/2017   25OHVITD1 20 (L) 09/24/2016   25OHVITD2 <1.0 09/24/2016   25OHVITD3 19 09/24/2016   CALCIUM 9.6 04/12/2017                 Coagulation Parameters Lab Results  Component Value Date   PLT 328 04/12/2017                 Cardiovascular Markers Lab Results  Component Value Date   HGB 13.2 04/12/2017   HCT 39.9 04/12/2017                 Note: Lab results reviewed.  Recent Diagnostic Imaging Review  Dg C-arm 1-60 Min-no Report  Result Date: 12/16/2016 Fluoroscopy was utilized by the requesting physician.  No radiographic interpretation.   Note: Imaging results reviewed.          Meds   Current Outpatient Prescriptions:  .  ALPRAZolam (XANAX) 0.25 MG tablet, Take 1 tablet (0.25 mg total) by mouth 2 (two) times daily as needed for anxiety., Disp: 60 tablet, Rfl: 4 .  carvedilol (COREG) 12.5 MG tablet, Take 1 tablet (12.5 mg total) by mouth 2 (two) times daily with a meal., Disp: 180 tablet, Rfl: 4 .  chlorhexidine (HIBICLENS) 4 % external liquid, Apply topically daily as needed., Disp: 473 mL, Rfl: 3 .  DULoxetine (CYMBALTA) 60 MG capsule, Take 1 capsule (60 mg total) by mouth daily., Disp: 90 capsule, Rfl: 4 .  esomeprazole (NEXIUM) 40 MG capsule, Take 40 mg by mouth daily at 12  noon., Disp: , Rfl:  .  glucose blood (IGLUCOSE TEST STRIPS) test strip, Use as instructed - freestyle insulin x strip, Disp: 100 each, Rfl: 12 .  losartan (COZAAR) 100 MG tablet, Take 1 tablet (100 mg total) by mouth daily., Disp: 90 tablet, Rfl: 4 .  nystatin (NYSTATIN) powder, Apply topically 4 (four) times daily., Disp: 45 g, Rfl: 4 .  [START ON 05/24/2017] oxyCODONE (OXY IR/ROXICODONE) 5 MG immediate release tablet, Take 1 tablet (5 mg total) by mouth 2 (two) times daily., Disp: 60 tablet, Rfl: 0 .  [START ON 06/23/2017] oxyCODONE (OXY IR/ROXICODONE) 5 MG immediate release tablet, Take 1 tablet (5 mg total) by mouth 2 (two) times daily., Disp: 60 tablet, Rfl: 0 .  [START ON 07/23/2017] oxyCODONE (OXY IR/ROXICODONE) 5 MG immediate release tablet, Take 1 tablet (5 mg total) by mouth 2 (two) times daily., Disp: 60 tablet, Rfl: 0  ROS  Constitutional: Denies any fever or chills Gastrointestinal: No reported hemesis, hematochezia, vomiting, or acute GI distress Musculoskeletal: Denies any acute onset joint swelling, redness, loss of ROM, or weakness Neurological: No reported episodes of acute onset apraxia, aphasia, dysarthria, agnosia, amnesia, paralysis, loss of coordination, or loss of consciousness  Allergies  Ms.  Maines is allergic to nsaids and erythromycin.  [place]  Drug: Ms. Que  reports that she does not use drugs. Alcohol:  reports that she does not drink alcohol. Tobacco:  reports that she has never smoked. She has never used smokeless tobacco. Medical:  has a past medical history of Acute anxiety (03/06/2015); Allergy; Anxiety; Degenerative joint disease of knee; Depression; Fibromyalgia; Obesity; Osteopenia; and RAD (reactive airway disease). Surgical: Ms. Edgin  has a past surgical history that includes Parotidectomy (Left); Appendectomy; Cholecystectomy; Abdominal hysterectomy; Tonsillectomy; Joint replacement (2014); and Eye surgery. Family: family history includes  Alzheimer's disease in her father; Cancer in her sister; Diabetes in her mother; Heart disease in her mother; Parkinson's disease in her father; Stroke in her mother.  Constitutional Exam  General appearance: Well nourished, well developed, and well hydrated. In no apparent acute distress Vitals:   05/12/17 1401  BP: (!) 139/39  Pulse: 66  Resp: 16  Temp: 98.1 F (36.7 C)  TempSrc: Oral  SpO2: 99%  Weight: 222 lb (100.7 kg)  Height: 5' (1.524 m)   BMI Assessment: Estimated body mass index is 43.36 kg/m as calculated from the following:   Height as of this encounter: 5' (1.524 m).   Weight as of this encounter: 222 lb (100.7 kg).  Psych/Mental status: Alert, oriented x 3 (person, place, & time)       Eyes: PERLA Respiratory: No evidence of acute respiratory distress  Cervical Spine Area Exam  Skin & Axial Inspection: No masses, redness, edema, swelling, or associated skin lesions Alignment: Symmetrical Functional ROM: Unrestricted ROM      Stability: No instability detected Muscle Tone/Strength: Functionally intact. No obvious neuro-muscular anomalies detected. Sensory (Neurological): Unimpaired Palpation: No palpable anomalies              Upper Extremity (UE) Exam    Side: Right upper extremity  Side: Left upper extremity  Skin & Extremity Inspection: DIP and PIP nodules  Skin & Extremity Inspection: DIP and PIP nodules  Functional ROM: Unrestricted ROM          Functional ROM: Unrestricted ROM          Muscle Tone/Strength: Functionally intact. No obvious neuro-muscular anomalies detected.  Muscle Tone/Strength: Functionally intact. No obvious neuro-muscular anomalies detected.  Sensory (Neurological): Unimpaired          Sensory (Neurological): Unimpaired          Palpation: No palpable anomalies              Palpation: No palpable anomalies              Specialized Test(s): Deferred         Specialized Test(s): Deferred          Thoracic Spine Area Exam  Skin & Axial  Inspection: No masses, redness, or swelling Alignment: Symmetrical Functional ROM: Unrestricted ROM Stability: No instability detected Muscle Tone/Strength: Functionally intact. No obvious neuro-muscular anomalies detected. Sensory (Neurological): Unimpaired Muscle strength & Tone: No palpable anomalies  Lumbar Spine Area Exam  Skin & Axial Inspection: No masses, redness, or swelling Alignment: Symmetrical Functional ROM: Unrestricted ROM      Stability: No instability detected Muscle Tone/Strength: Functionally intact. No obvious neuro-muscular anomalies detected. Sensory (Neurological): Unimpaired Palpation: Complains of area being tender to palpation       Provocative Tests: Lumbar Hyperextension and rotation test: evaluation deferred today       Lumbar Lateral bending test: evaluation deferred today  Patrick's Maneuver: evaluation deferred today                    Gait & Posture Assessment  Ambulation: Unassisted Gait: Relatively normal for age and body habitus Posture: WNL   Lower Extremity Exam    Side: Right lower extremity  Side: Left lower extremity  Skin & Extremity Inspection: Skin color, temperature, and hair growth are WNL. No peripheral edema or cyanosis. No masses, redness, swelling, asymmetry, or associated skin lesions. No contractures.  Skin & Extremity Inspection: Skin color, temperature, and hair growth are WNL. No peripheral edema or cyanosis. No masses, redness, swelling, asymmetry, or associated skin lesions. No contractures.  Functional ROM: Unrestricted ROM          Functional ROM: Unrestricted ROM          Muscle Tone/Strength: Functionally intact. No obvious neuro-muscular anomalies detected.  Muscle Tone/Strength: Functionally intact. No obvious neuro-muscular anomalies detected.  Sensory (Neurological): Unimpaired  Sensory (Neurological): Unimpaired  Palpation: No palpable anomalies  Palpation: No palpable anomalies   Assessment  Primary Diagnosis  & Pertinent Problem List: The primary encounter diagnosis was Chronic low back pain (Location of Secondary source of pain) (Bilateral) (L>R). Diagnoses of Chronic hand pain, unspecified laterality, Lumbar spondylosis, Osteoarthritis, Fibromyalgia, and Chronic pain syndrome were also pertinent to this visit.  Status Diagnosis  Controlled Controlled Controlled 1. Chronic low back pain (Location of Secondary source of pain) (Bilateral) (L>R)   2. Chronic hand pain, unspecified laterality   3. Lumbar spondylosis   4. Osteoarthritis   5. Fibromyalgia   6. Chronic pain syndrome     Problems updated and reviewed during this visit: No problems updated. Plan of Care  Pharmacotherapy (Medications Ordered): Meds ordered this encounter  Medications  . oxyCODONE (OXY IR/ROXICODONE) 5 MG immediate release tablet    Sig: Take 1 tablet (5 mg total) by mouth 2 (two) times daily.    Dispense:  60 tablet    Refill:  0    Fill one day early if pharmacy is closed on scheduled refill date. Do not fill until: 05/24/2017 To last until: 06/23/2017    Order Specific Question:   Supervising Provider    Answer:   Delano Metz [phone]  . oxyCODONE (OXY IR/ROXICODONE) 5 MG immediate release tablet    Sig: Take 1 tablet (5 mg total) by mouth 2 (two) times daily.    Dispense:  60 tablet    Refill:  0    Fill one day early if pharmacy is closed on scheduled refill date. Do not fill until: 06/23/2017 To last until:07/23/2017    Order Specific Question:   Supervising Provider    Answer:   Delano Metz [phone]  . oxyCODONE (OXY IR/ROXICODONE) 5 MG immediate release tablet    Sig: Take 1 tablet (5 mg total) by mouth 2 (two) times daily.    Dispense:  60 tablet    Refill:  0    Fill one day early if pharmacy is closed on scheduled refill date. Do not fill until: 07/23/2017 To last until: 08/22/2017    Order Specific Question:   Supervising Provider    Answer:   Delano Metz [phone]   New  Prescriptions   No medications on file   Medications administered today: Ms. Gazzola had no medications administered during this visit. Lab-work, procedure(s), and/or referral(s): No orders of the defined types were placed in this encounter.  Imaging and/or referral(s): None  Interventional therapies: Planned, scheduled, and/or pending:  Not at this time.   Considering:   Diagnostic right Genicular nerve block Possible right Genicular nerve radiofrequencyablation  Diagnosticleft intra-articular knee joint injection Possible series of 5 left intra-articular Hyalgan knee injections Diagnostic left Genicular nerve block Possibleleft Genicular nerve RFA Diagnostic bilateral lumbar facet block Possible bilateral lumbar facet RFA Diagnostic left lumbar epidural steroid injection Diagnostic caudal epidural steroid injection + epidurogram Possible Racz procedure Diagnostic bilateral cervical facet block Possible bilateral cervical facet RFA Diagnostic right versus left cervical epidural steroid injection Diagnostic bilateral intra-articular shoulder joint injection Diagnostic bilateral suprascapular nerve block Possible bilateral suprascapular nerve RFA   Palliative PRN treatment(s):   Palliative bilateralGenicular nerve block Palliative left intra-articular knee joint injection Palliative bilateral lumbar facet block  Palliativeleft lumbar epidural steroid injection  Palliative caudal epiduralsteroid injection + epidurogram  Palliative bilateral cervical facet block Palliative right versus left cervical epidural steroid injection  Palliative bilateral intra-articular shoulderjoint injection  Palliative bilateral suprascapularnerve block    Provider-requested follow-up: Return in about 3 months (around 08/11/2017) for MedMgmt.  Future Appointments Date Time Provider [place]  08/11/2017 12:45 PM Vevelyn Francois, NP [facility] None   10/13/2017 1:30 PM Jeananne Rama Jeannette How, MD [facility] None   Primary Care Physician: Guadalupe Maple, MD Location: [facility] Note by: Vevelyn Francois NP Date: 05/12/2017; Time: 3:57 PM  Pain Score Disclaimer: We use the NRS-11 scale. This is a self-reported, subjective measurement of pain severity with only modest accuracy. It is used primarily to identify changes within a particular patient. It must be understood that outpatient pain scales are significantly less accurate that those used for research, where they can be applied under ideal controlled circumstances with minimal exposure to variables. In reality, the score is likely to be a combination of pain intensity and pain affect, where pain affect describes the degree of emotional arousal or changes in action readiness caused by the sensory experience of pain. Factors such as social and work situation, setting, emotional state, anxiety levels, expectation, and prior pain experience may influence pain perception and show large inter-individual differences that may also be affected by time variables.  Patient instructions provided during this appointment: Patient Instructions    ____________________________________________________________________________________________  Medication Rules  Applies to: All patients receiving prescriptions (written or electronic).  Pharmacy of record: Pharmacy where electronic prescriptions will be sent. If written prescriptions are taken to a different pharmacy, please inform the nursing staff. The pharmacy listed in the electronic medical record should be the one where you would like electronic prescriptions to be sent.  Prescription refills: Only during scheduled appointments. Applies to both, written and electronic prescriptions.  NOTE: The following applies primarily to controlled substances (Opioid* Pain Medications).   Patient's responsibilities: 1. Pain Pills: Bring all  pain pills to every appointment (except for procedure appointments). 2. Pill Bottles: Bring pills in original pharmacy bottle. Always bring newest bottle. Bring bottle, even if empty. 3. Medication refills: You are responsible for knowing and keeping track of what medications you need refilled. The day before your appointment, write a list of all prescriptions that need to be refilled. Bring that list to your appointment and give it to the admitting nurse. Prescriptions will be written only during appointments. If you forget a medication, it will not be "Called in", "Faxed", or "electronically sent". You will need to get another appointment to get these prescribed. 4. Prescription Accuracy: You are responsible for carefully inspecting your prescriptions before leaving our office. Have the discharge nurse  carefully go over each prescription with you, before taking them home. Make sure that your name is accurately spelled, that your address is correct. Check the name and dose of your medication to make sure it is accurate. Check the number of pills, and the written instructions to make sure they are clear and accurate. Make sure that you are given enough medication to last until your next medication refill appointment. 5. Taking Medication: Take medication as prescribed. Never take more pills than instructed. Never take medication more frequently than prescribed. Taking less pills or less frequently is permitted and encouraged, when it comes to controlled substances (written prescriptions).  6. Inform other Doctors: Always inform, all of your healthcare providers, of all the medications you take. 7. Pain Medication from other Providers: You are not allowed to accept any additional pain medication from any other Doctor or Healthcare provider. There are two exceptions to this rule. (see below) In the event that you require additional pain medication, you are responsible for notifying us, as stated  below. 8. Medication Agreement: You are responsible for carefully reading and following our Medication Agreement. This must be signed before receiving any prescriptions from our practice. Safely store a copy of your signed Agreement. Violations to the Agreement will result in no further prescriptions. (Additional copies of our Medication Agreement are available upon request.) 9. Laws, Rules, & Regulations: All patients are expected to follow all Federal and [employer], [employer], Rules, [employer]. Ignorance of the Laws does not constitute a valid excuse. The use of any illegal substances is prohibited. 10. Adopted CDC guidelines & recommendations: Target dosing levels will be at or below 60 MME/day. Use of benzodiazepines** is not recommended.  Exceptions: There are only two exceptions to the rule of not receiving pain medications from other Healthcare Providers. 1. Exception #1 (Emergencies): In the event of an emergency (i.e.: accident requiring emergency care), you are allowed to receive additional pain medication. However, you are responsible for: As soon as you are able, call our office [phone], at any time of the day or night, and leave a message stating your name, the date and nature of the emergency, and the name and dose of the medication prescribed. In the event that your call is answered by a member of our staff, make sure to document and save the date, time, and the name of the person that took your information.  2. Exception #2 (Planned Surgery): In the event that you are scheduled by another doctor or dentist to have any type of surgery or procedure, you are allowed (for a period no longer than 30 days), to receive additional pain medication, for the acute post-op pain. However, in this case, you are responsible for picking up a copy of our "Post-op Pain Management for Surgeons" handout, and giving it to your surgeon or dentist. This document is available at our office, and does not  require an appointment to obtain it. Simply go to our office during business hours (Monday-Thursday from 8:00 AM to 4:00 PM) (Friday 8:00 AM to 12:00 Noon) or if you have a scheduled appointment with [place], prior to your surgery, and ask for it by name. In addition, you will need to provide [place] with your name, name of your surgeon, type of surgery, and date of procedure or surgery.  *Opioid medications include: morphine, codeine, oxycodone, oxymorphone, hydrocodone, hydromorphone, meperidine, tramadol, tapentadol, buprenorphine, fentanyl, methadone. **Benzodiazepine medications include: diazepam (Valium), alprazolam (Xanax), clonazepam (Klonopine), lorazepam (Ativan), clorazepate (Tranxene), chlordiazepoxide (Librium),  estazolam (Prosom), oxazepam (Serax), temazepam (Restoril), triazolam (Halcion)  ____________________________________________________________________________________________  BMI Assessment: Estimated body mass index is 43.36 kg/m as calculated from the following:   Height as of this encounter: 5' (1.524 m).   Weight as of this encounter: 222 lb (100.7 kg).  BMI interpretation table: BMI level Category Range association with higher incidence of chronic pain  <18 kg/m2 Underweight   18.5-24.9 kg/m2 Ideal body weight   25-29.9 kg/m2 Overweight Increased incidence by 20%  30-34.9 kg/m2 Obese (Class I) Increased incidence by 68%  35-39.9 kg/m2 Severe obesity (Class II) Increased incidence by 136%  >40 kg/m2 Extreme obesity (Class III) Increased incidence by 254%   BMI Readings from Last 4 Encounters:  05/12/17 43.36 kg/m  04/12/17 43.55 kg/m  03/05/17 43.83 kg/m  02/10/17 43.36 kg/m   Wt Readings from Last 4 Encounters:  05/12/17 222 lb (100.7 kg)  04/12/17 223 lb (101.2 kg)  03/05/17 224 lb 6.4 oz (101.8 kg)  02/10/17 222 lb (100.7 kg)

## 2017-06-03 ENCOUNTER — Encounter: Payer: Self-pay | Admitting: Family Medicine

## 2017-06-03 ENCOUNTER — Other Ambulatory Visit: Payer: Self-pay | Admitting: Family Medicine

## 2017-06-03 ENCOUNTER — Ambulatory Visit (INDEPENDENT_AMBULATORY_CARE_PROVIDER_SITE_OTHER): Payer: Medicare Other | Admitting: Family Medicine

## 2017-06-03 VITALS — BP 142/75 | HR 67 | Temp 99.5°F | Wt 222.0 lb

## 2017-06-03 DIAGNOSIS — J069 Acute upper respiratory infection, unspecified: Secondary | ICD-10-CM | POA: Diagnosis not present

## 2017-06-03 MED ORDER — AMOXICILLIN-POT CLAVULANATE 875-125 MG PO TABS: 1.0000 | ORAL_TABLET | Freq: Two times a day (BID) | ORAL | 0 refills | Status: DC

## 2017-06-03 NOTE — Progress Notes (Signed)
   BP (!) 142/75   Pulse 67   Temp 99.5 F (37.5 C)   Wt 222 lb (100.7 kg)   SpO2 98%   BMI 43.36 kg/m    Subjective:    Patient ID: Cynthia Jones, female    DOB: 1941/05/09, 76 y.o.   MRN: [id number]  HPI: Cynthia Jones is a 76 y.o. female  Chief Complaint  Patient presents with  . URI    x 4 days, head congestion, runny nose, sore throat, sinus drainage, h/a,  some cough, low grade temp. No chest congestion,  ear pain   Patient presents with 4-5 day hx of congestion, rhinorrhea, sore throat, sinus pain and pressure, HAs. Denies  Fever, CP, SOB, wheezing. Taking coricidin with minimal relief. Several sick contacts.  Relevant past medical, surgical, family and social history reviewed and updated as indicated. Interim medical history since our last visit reviewed. Allergies and medications reviewed and updated.  Review of Systems  Constitutional: Negative.   HENT: Positive for congestion, rhinorrhea, sinus pain, sinus pressure and sore throat.   Respiratory: Negative.   Cardiovascular: Negative.   Gastrointestinal: Negative.   Musculoskeletal: Negative.   Neurological: Positive for headaches.  Psychiatric/Behavioral: Negative.    Per HPI unless specifically indicated above     Objective:    BP (!) 142/75   Pulse 67   Temp 99.5 F (37.5 C)   Wt 222 lb (100.7 kg)   SpO2 98%   BMI 43.36 kg/m   Wt Readings from Last 3 Encounters:  06/03/17 222 lb (100.7 kg)  05/12/17 222 lb (100.7 kg)  04/12/17 223 lb (101.2 kg)    Physical Exam  Constitutional: She is oriented to person, place, and time. She appears well-developed and well-nourished.  HENT:  Head: Atraumatic.  Right Ear: External ear normal.  Left Ear: External ear normal.  Mouth/Throat: No oropharyngeal exudate.  Nasal mucosa and oropharynx erythematous  Eyes: Pupils are equal, round, and reactive to light. Conjunctivae are normal.  Neck: Normal range of motion. Neck supple.  Cardiovascular:  Normal rate and normal heart sounds.   Pulmonary/Chest: Effort normal and breath sounds normal.  Musculoskeletal: Normal range of motion.  Neurological: She is alert and oriented to person, place, and time.  Skin: Skin is warm and dry.  Psychiatric: She has a normal mood and affect. Her behavior is normal.  Nursing note and vitals reviewed.     Assessment & Plan:   Problem List Items Addressed This Visit    None    Visit Diagnoses    Upper respiratory tract infection, unspecified type    -  Primary   Discussed supportive care, continue coricidin, sinus rinses, humidifier. Augmentin sent in case worsening or not improving over next few days       Follow up plan: Return for as scheduled.

## 2017-06-05 NOTE — Patient Instructions (Signed)
Follow up as scheduled.  

## 2017-06-08 ENCOUNTER — Other Ambulatory Visit: Payer: Self-pay | Admitting: Family Medicine

## 2017-06-22 ENCOUNTER — Ambulatory Visit (INDEPENDENT_AMBULATORY_CARE_PROVIDER_SITE_OTHER): Payer: Medicare Other

## 2017-06-22 DIAGNOSIS — Z23 Encounter for immunization: Secondary | ICD-10-CM | POA: Diagnosis not present

## 2017-06-29 ENCOUNTER — Telehealth: Payer: Self-pay | Admitting: Family Medicine

## 2017-06-29 NOTE — Telephone Encounter (Signed)
Form printed and prefilled, placed on provider's desk for signature.

## 2017-06-29 NOTE — Telephone Encounter (Signed)
Copied from CRM #[id number]. Topic: Quick Communication - See Telephone Encounter >> Jun 29, 2017  9:46 AM Louie Bun, Rosey Bath D wrote: CRM for notification. See Telephone encounter for: 06/29/17. Patient needs [name] to sign a handy cap paper for patient. She needs it before end of October. If you have any question please call patient back [phone], thanks.

## 2017-06-30 NOTE — Telephone Encounter (Signed)
Pt called requesting that handicap form be completed today, it expires.  Please call patient when ready for pick up.

## 2017-08-01 ENCOUNTER — Emergency Department
Admission: EM | Admit: 2017-08-01 | Discharge: 2017-08-01 | Disposition: A | Discharge status: Home / Self Care | Payer: Medicare Other | Attending: Emergency Medicine | Admitting: Emergency Medicine

## 2017-08-01 ENCOUNTER — Emergency Department: Payer: Medicare Other

## 2017-08-01 DIAGNOSIS — S63502A Unspecified sprain of left wrist, initial encounter: Secondary | ICD-10-CM

## 2017-08-01 DIAGNOSIS — Z79899 Other long term (current) drug therapy: Secondary | ICD-10-CM | POA: Insufficient documentation

## 2017-08-01 DIAGNOSIS — Y92008 Other place in unspecified non-institutional (private) residence as the place of occurrence of the external cause: Secondary | ICD-10-CM | POA: Insufficient documentation

## 2017-08-01 DIAGNOSIS — Y999 Unspecified external cause status: Secondary | ICD-10-CM | POA: Insufficient documentation

## 2017-08-01 DIAGNOSIS — G8929 Other chronic pain: Secondary | ICD-10-CM | POA: Diagnosis not present

## 2017-08-01 DIAGNOSIS — E119 Type 2 diabetes mellitus without complications: Secondary | ICD-10-CM | POA: Insufficient documentation

## 2017-08-01 DIAGNOSIS — Z79891 Long term (current) use of opiate analgesic: Secondary | ICD-10-CM | POA: Insufficient documentation

## 2017-08-01 DIAGNOSIS — W010XXA Fall on same level from slipping, tripping and stumbling without subsequent striking against object, initial encounter: Secondary | ICD-10-CM | POA: Diagnosis not present

## 2017-08-01 DIAGNOSIS — I10 Essential (primary) hypertension: Secondary | ICD-10-CM | POA: Diagnosis not present

## 2017-08-01 DIAGNOSIS — S6992XA Unspecified injury of left wrist, hand and finger(s), initial encounter: Secondary | ICD-10-CM | POA: Diagnosis not present

## 2017-08-01 DIAGNOSIS — Y9301 Activity, walking, marching and hiking: Secondary | ICD-10-CM | POA: Diagnosis not present

## 2017-08-01 DIAGNOSIS — W19XXXA Unspecified fall, initial encounter: Secondary | ICD-10-CM

## 2017-08-01 DIAGNOSIS — M25532 Pain in left wrist: Secondary | ICD-10-CM | POA: Diagnosis not present

## 2017-08-01 MED ORDER — PREDNISONE 10 MG PO TABS: 10.0000 mg | ORAL_TABLET | Freq: Every day | ORAL | 0 refills | Status: DC

## 2017-08-01 NOTE — ED Provider Notes (Signed)
[facility] Emergency Department Provider Note  ____________________________________________  Time seen: Approximately 5:00 PM  I have reviewed the triage vital signs and the nursing notes.   HISTORY  Chief Complaint Fall and Wrist Pain    HPI Cynthia Jones is a 76 y.o. female who presents the emergency department complaining of left wrist pain.  Patient reports that she was in her living room, tripped, tried to catch herself with her left wrist.  Initially, patient thought she sprained her wrist but is had continued pain and swelling to the left wrist.  Patient does have a history of osteopenia, osteoarthritis, fibromyalgia.  She was concerned that she may have suffered a fracture as symptoms were not improving.  Patient is on chronic pain medication for arthritis and fibromyalgia.  Patient has been taking these without any relief.  She did not hit her head or lose consciousness.  She denies any other pain complaint.  Other than her prescribed pain medication, no other medications for this injury.  Past Medical History:  Diagnosis Date  . Acute anxiety 03/06/2015  . Allergy   . Anxiety   . Degenerative joint disease of knee   . Depression   . Fibromyalgia   . Obesity   . Osteopenia   . RAD (reactive airway disease)     Patient Active Problem List   Diagnosis Date Noted  . Advanced care planning/counseling discussion 04/12/2017  . Lumbar spondylosis 10/07/2016  . Hypercholesteremia 09/14/2016  . Long term prescription opiate use 09/08/2016  . Long term prescription benzodiazepine use 09/08/2016  . Chronic pain syndrome 09/08/2016  . Chronic low back pain (Location of Secondary source of pain) (Bilateral) (L>R) 09/08/2016  . Chronic knee pain (Location of Primary Source of Pain) (Bilateral) (L>R) 09/08/2016  . Cervicogenic headache 09/08/2016  . Chronic hip pain (Bilateral) 09/08/2016  . Chronic hand pain (Bilateral) (R>L) 09/08/2016  . Chronic  foot pain (Left) 09/08/2016  . Insomnia 09/17/2015  . Folliculitis 08/05/2015  . S/P TKR Arthroplasty (Right) 04/17/2015  . Fibromyalgia 04/09/2015  . Bilateral occipital neuralgia 04/09/2015  . Greater trochanteric bursitis 04/09/2015  . Osteoarthritis of knee (Location of Primary Source of Pain) (Left) 04/09/2015  . Sacroiliac joint disease 04/09/2015  . Lumbar facet syndrome (Bilateral) (L>R) 04/09/2015  . Neuropathy due to secondary diabetes (HCC) 04/09/2015  . Osteopenia 03/06/2015  . Depression 03/06/2015  . Essential hypertension 03/06/2015  . Diabetes mellitus (HCC) 03/06/2015  . Osteoarthritis 03/06/2015  . Seasonal allergic rhinitis 03/06/2015  . Obesity 03/06/2015  . Intertrigo 03/06/2015    Past Surgical History:  Procedure Laterality Date  . ABDOMINAL HYSTERECTOMY    . APPENDECTOMY    . CHOLECYSTECTOMY    . EYE SURGERY    . JOINT REPLACEMENT  2014   right knee  . PAROTIDECTOMY Left   . TONSILLECTOMY      Prior to Admission medications   Medication Sig Start Date End Date Taking? Authorizing Provider  ALPRAZolam (XANAX) 0.25 MG tablet Take 1 tablet (0.25 mg total) by mouth 2 (two) times daily as needed for anxiety. 04/12/17   Steele Sizer, MD  amoxicillin-clavulanate (AUGMENTIN) 875-125 MG tablet Take 1 tablet by mouth 2 (two) times daily. 06/03/17   [name], PA-C  carvedilol (COREG) 12.5 MG tablet Take 1 tablet (12.5 mg total) by mouth 2 (two) times daily with a meal. 04/12/17   Crissman, Redge Gainer, MD  chlorhexidine (HIBICLENS) 4 % external liquid Apply topically daily as needed. 02/03/17  [name], PA-C  DULoxetine (CYMBALTA) 60 MG capsule Take 1 capsule (60 mg total) by mouth daily. 04/12/17   Steele Sizer, MD  esomeprazole (NEXIUM) 40 MG capsule Take 40 mg by mouth daily at 12 noon.    [provider]  glucose blood (IGLUCOSE TEST STRIPS) test strip Use as instructed - freestyle insulin x strip 12/10/16   Crissman, Redge Gainer,  MD  losartan (COZAAR) 100 MG tablet TAKE 1 TABLET (100 MG TOTAL) BY MOUTH DAILY. 06/03/17   [name], PA-C  nystatin (NYSTATIN) powder Apply topically 4 (four) times daily. 04/12/17   Steele Sizer, MD  oxyCODONE (OXY IR/ROXICODONE) 5 MG immediate release tablet Take 1 tablet (5 mg total) by mouth 2 (two) times daily. 05/24/17 06/23/17  Barbette Merino, NP  oxyCODONE (OXY IR/ROXICODONE) 5 MG immediate release tablet Take 1 tablet (5 mg total) by mouth 2 (two) times daily. Patient not taking: Reported on 06/03/2017 06/23/17 07/23/17  Barbette Merino, NP  oxyCODONE (OXY IR/ROXICODONE) 5 MG immediate release tablet Take 1 tablet (5 mg total) by mouth 2 (two) times daily. 07/23/17 08/22/17  Barbette Merino, NP  predniSONE (DELTASONE) 10 MG tablet Take 1 tablet (10 mg total) by mouth daily. 08/01/17   Manna Gose, Delorise Royals, PA-C    Allergies Nsaids and Erythromycin  Family History  Problem Relation Age of Onset  . Heart disease Mother   . Stroke Mother   . Diabetes Mother   . Alzheimer's disease Father   . Parkinson's disease Father   . Cancer Sister     Social History Social History   Tobacco Use  . Smoking status: Never Smoker  . Smokeless tobacco: Never Used  Substance Use Topics  . Alcohol use: No    Alcohol/week: 0.0 oz  . Drug use: No     Review of Systems  Constitutional: No fever/chills Eyes: No visual changes. Cardiovascular: no chest pain. Respiratory: no cough. No SOB. Gastrointestinal: No abdominal pain.  No nausea, no vomiting.   Musculoskeletal: Positive for left wrist pain Skin: Negative for rash, abrasions, lacerations, ecchymosis. Neurological: Negative for headaches, focal weakness or numbness. 10-point ROS otherwise negative.  ____________________________________________   PHYSICAL EXAM:  VITAL SIGNS: ED Triage Vitals  Enc Vitals Group     BP 08/01/17 1606 (!) 145/59     Pulse Rate 08/01/17 1606 81     Resp 08/01/17 1606 14     Temp  08/01/17 1606 99.7 F (37.6 C)     Temp Source 08/01/17 1606 Oral     SpO2 08/01/17 1606 98 %     Weight 08/01/17 1608 222 lb (100.7 kg)     Height 08/01/17 1608 5' (1.524 m)     Head Circumference --      Peak Flow --      Pain Score 08/01/17 1608 9     Pain Loc --      Pain Edu? --      Excl. in GC? --      Constitutional: Alert and oriented. Well appearing and in no acute distress. Eyes: Conjunctivae are normal. PERRL. EOMI. Head: Atraumatic. Neck: No stridor.    Cardiovascular: Normal rate, regular rhythm. Normal S1 and S2.  Good peripheral circulation. Respiratory: Normal respiratory effort without tachypnea or retractions. Lungs CTAB. Good air entry to the bases with no decreased or absent breath sounds. Musculoskeletal: Full range of motion to all extremities. No gross deformities appreciated.  No gross deformity to left  wrist upon inspection, mild edema noted to the dorsal aspect of the wrist.  Patient has tenderness to palpation over the distal dorsal radius and ulna.  No palpable abnormality.  Full range of motion to the left wrist, full range of motion all 5 digits left hand.  Radial pulse intact.  Capillary refill intact all 5 digits.  Sensation intact all 5 digits.  Examination of the elbow was unremarkable. Neurologic:  Normal speech and language. No gross focal neurologic deficits are appreciated.  Skin:  Skin is warm, dry and intact. No rash noted. Psychiatric: Mood and affect are normal. Speech and behavior are normal. Patient exhibits appropriate insight and judgement.   ____________________________________________   LABS (all labs ordered are listed, but only abnormal results are displayed)  Labs Reviewed - No data to display ____________________________________________  EKG   ____________________________________________  RADIOLOGY Festus Barren Servando Kyllonen, personally viewed and evaluated these images (plain radiographs) as part of my medical decision  making, as well as reviewing the written report by the radiologist.  Dg Wrist Complete Left  Result Date: 08/01/2017 CLINICAL DATA:  Acute wrist pain following fall 2 days ago. Initial encounter. EXAM: LEFT WRIST - COMPLETE 3+ VIEW COMPARISON:  09/08/2016 radiographs FINDINGS: There is no evidence of acute fracture, subluxation or dislocation. Degenerative changes at the first carpometacarpal joint again identified. Calcification along dorsal wrist is compatible with prior trauma. IMPRESSION: No evidence of acute abnormality Electronically Signed   By: Harmon Pier M.D.   On: 08/01/2017 16:45    ____________________________________________    PROCEDURES  Procedure(s) performed:    .Splint Application Date/Time: 08/01/2017 5:17 PM Performed by: Racheal Patches, PA-C Authorized by: Racheal Patches, PA-C   Consent:    Consent obtained:  Verbal   Consent given by:  Patient   Risks discussed:  Pain and swelling Pre-procedure details:    Sensation:  Normal Procedure details:    Laterality:  Left   Location:  Wrist   Wrist:  L wrist   Splint type:  Wrist   Supplies:  Prefabricated splint Post-procedure details:    Pain:  Improved   Sensation:  Normal   Patient tolerance of procedure:  Tolerated well, no immediate complications Comments:     Velcro wrist splint applied to the left wrist      Medications - No data to display   ____________________________________________   INITIAL IMPRESSION / ASSESSMENT AND PLAN / ED COURSE  Pertinent labs & imaging results that were available during my care of the patient were reviewed by me and considered in my medical decision making (see chart for details).  Review of the [place] CSRS was performed in accordance of the [facility] prior to dispensing any controlled drugs.     Patient's diagnosis is consistent with sprain of the left wrist.  Patient presents with continued pain to the left wrist after injury.  Exam is reassuring  but due to patient's history, imaging was obtained.  Imaging reveals no acute osseous abnormality but does reveal significant osteoarthritic changes throughout the wrist and hand.  Differential included sprain versus contusion versus fracture versus dislocation.  Patient is given Velcro wrist brace in the emergency department.. Patient will be discharged home with prescriptions for steroid for inflammation and pain relief.  Patient has chronic pain medication which she is to continue.. Patient is to follow up with primary care or orthopedics as needed or otherwise directed. Patient is given ED precautions to return to the ED for any worsening or new  symptoms.     ____________________________________________  FINAL CLINICAL IMPRESSION(S) / ED DIAGNOSES  Final diagnoses:  Sprain of left wrist, initial encounter  Fall, initial encounter      NEW MEDICATIONS STARTED DURING THIS VISIT:  ED Discharge Orders        Ordered    predniSONE (DELTASONE) 10 MG tablet  Daily    Comments:  Take 6 pills x 2 days, 5 pills x 2 days, 4 pills x 2 days, 3 pills x 2 days, 2 pills x 2 days, and 1 pill x 2 days   08/01/17 1719          This chart was dictated using voice recognition software/Dragon. Despite best efforts to proofread, errors can occur which can change the meaning. Any change was purely unintentional.    Racheal PatchesCuthriell, Tyshawna Alarid D, PA-C 08/01/17 1721    Dionne BucySiadecki, Sebastian, MD 08/01/17 [phone]

## 2017-08-01 NOTE — ED Triage Notes (Signed)
Pt presents via POV c/o left wrist pain. Pt s/p fall on Friday and reports pain has not eased up.

## 2017-08-11 ENCOUNTER — Ambulatory Visit: Payer: Medicare Other | Admitting: Nurse Practitioner

## 2017-08-16 ENCOUNTER — Other Ambulatory Visit: Payer: Self-pay

## 2017-08-16 ENCOUNTER — Encounter: Payer: Self-pay | Admitting: Nurse Practitioner

## 2017-08-16 ENCOUNTER — Ambulatory Visit: Payer: Medicare Other | Attending: Nurse Practitioner | Admitting: Nurse Practitioner

## 2017-08-16 VITALS — BP 95/76 | HR 67 | Temp 97.7°F | Ht 60.0 in | Wt 222.0 lb

## 2017-08-16 DIAGNOSIS — M25551 Pain in right hip: Secondary | ICD-10-CM | POA: Insufficient documentation

## 2017-08-16 DIAGNOSIS — M25559 Pain in unspecified hip: Secondary | ICD-10-CM | POA: Diagnosis not present

## 2017-08-16 DIAGNOSIS — G8929 Other chronic pain: Secondary | ICD-10-CM

## 2017-08-16 DIAGNOSIS — F329 Major depressive disorder, single episode, unspecified: Secondary | ICD-10-CM | POA: Insufficient documentation

## 2017-08-16 DIAGNOSIS — Z79899 Other long term (current) drug therapy: Secondary | ICD-10-CM | POA: Diagnosis not present

## 2017-08-16 DIAGNOSIS — G894 Chronic pain syndrome: Secondary | ICD-10-CM | POA: Diagnosis not present

## 2017-08-16 DIAGNOSIS — M25552 Pain in left hip: Secondary | ICD-10-CM | POA: Diagnosis not present

## 2017-08-16 DIAGNOSIS — Z794 Long term (current) use of insulin: Secondary | ICD-10-CM | POA: Insufficient documentation

## 2017-08-16 DIAGNOSIS — Z5181 Encounter for therapeutic drug level monitoring: Secondary | ICD-10-CM | POA: Insufficient documentation

## 2017-08-16 DIAGNOSIS — Z9889 Other specified postprocedural states: Secondary | ICD-10-CM | POA: Insufficient documentation

## 2017-08-16 DIAGNOSIS — Z9049 Acquired absence of other specified parts of digestive tract: Secondary | ICD-10-CM | POA: Diagnosis not present

## 2017-08-16 DIAGNOSIS — Z90710 Acquired absence of both cervix and uterus: Secondary | ICD-10-CM | POA: Diagnosis not present

## 2017-08-16 DIAGNOSIS — F419 Anxiety disorder, unspecified: Secondary | ICD-10-CM | POA: Insufficient documentation

## 2017-08-16 DIAGNOSIS — M25561 Pain in right knee: Secondary | ICD-10-CM

## 2017-08-16 DIAGNOSIS — M545 Low back pain: Secondary | ICD-10-CM | POA: Diagnosis not present

## 2017-08-16 DIAGNOSIS — M25562 Pain in left knee: Secondary | ICD-10-CM | POA: Diagnosis not present

## 2017-08-16 DIAGNOSIS — M8580 Other specified disorders of bone density and structure, unspecified site: Secondary | ICD-10-CM | POA: Insufficient documentation

## 2017-08-16 MED ORDER — OXYCODONE HCL 5 MG PO TABS: 5.0000 mg | ORAL_TABLET | Freq: Two times a day (BID) | ORAL | 0 refills | Status: DC

## 2017-08-16 NOTE — Progress Notes (Signed)
Patient's Name: Cynthia Jones  MRN: [id number]  Referring Provider: Guadalupe Maple, MD  DOB: Jan 13, 1941  PCP: Guadalupe Maple, MD  DOS: 08/16/2017  Note by: Vevelyn Francois NP  Service setting: Ambulatory outpatient  Specialty: Interventional Pain Management  Location: [facility] (AMB) Pain Management Facility    Patient type: Established    Primary Reason(s) for Visit: Encounter for prescription drug management. (Level of risk: moderate)  CC: Back Pain (lower)  HPI  Cynthia Jones is a 76 y.o. year old, female patient, who comes today for a medication management evaluation. She has Osteopenia; Depression; Essential hypertension; Diabetes mellitus ([place]); Osteoarthritis; Seasonal allergic rhinitis; Obesity; Intertrigo; Fibromyalgia; Bilateral occipital neuralgia; Greater trochanteric bursitis; Osteoarthritis of knee (Location of Primary Source of Pain) (Left); Sacroiliac joint disease; Lumbar facet syndrome (Bilateral) (L>R); Neuropathy due to secondary diabetes ([place]); S/P TKR Arthroplasty (Right); Folliculitis; Insomnia; Long term prescription opiate use; Long term prescription benzodiazepine use; Chronic pain syndrome; Chronic low back pain (Location of Secondary source of pain) (Bilateral) (L>R); Chronic knee pain (Location of Primary Source of Pain) (Bilateral) (L>R); Cervicogenic headache; Chronic hip pain (Bilateral); Chronic hand pain (Bilateral) (R>L); Chronic foot pain (Left); Hypercholesteremia; Lumbar spondylosis; and Advanced care planning/counseling discussion on their problem list. Her primarily concern today is the Back Pain (lower)  Pain Assessment: Location: Lower Back Radiating: Denies Onset:   Duration: Chronic pain Quality: Aching Severity: 7 /10 (self-reported pain score)  Note: Reported level is compatible with observation. Clinically the patient looks like a 3/10 A 3/10 is viewed as "Moderate" and described as significantly interfering with activities of daily living (ADL).  It becomes difficult to feed, bathe, get dressed, get on and off the toilet or to perform personal hygiene functions. Difficult to get in and out of bed or a chair without assistance. Very distracting. With effort, it can be ignored when deeply involved in activities. Information on the proper use of the pain scale provided to the patient today.  Timing: Constant   Cynthia Jones was last scheduled for an appointment on 05/12/2017 for medication management. During today's appointment we reviewed Cynthia Jones's chronic pain status, as well as her outpatient medication regimen. She admits that she is having left wrist pain secondary to a fall on last Saturday. She admits that she did seek medical care. She admits that it is doing a little better.  The patient  reports that she does not use drugs. Her body mass index is 43.36 kg/m.  Further details on both, my assessment(s), as well as the proposed treatment plan, please see below.  Controlled Substance Pharmacotherapy Assessment REMS (Risk Evaluation and Mitigation Strategy)  Analgesic:Oxycodone IR 5 mg 1 tablet by mouth twice a day MME/day:'15mg'$ /day .    Cynthia Fischer, RN  08/16/2017  2:36 PM  Sign at close encounter Nursing Pain Medication Assessment:  Safety precautions to be maintained throughout the outpatient stay will include: orient to surroundings, keep bed in low position, maintain call bell within reach at all times, provide assistance with transfer out of bed and ambulation.  Medication Inspection Compliance: Pill count conducted under aseptic conditions, in front of the patient. Neither the pills nor the bottle was removed from the patient's sight at any time. Once count was completed pills were immediately returned to the patient in their original bottle.  Medication: Oxycodone IR Pill/Patch Count: 13 of 60 pills remain Pill/Patch Appearance: Markings consistent with prescribed medication Bottle Appearance: Standard pharmacy  container. Clearly labeled. Filled Date: 11/24/ 2018 Last  Medication intake:  Today   Pharmacokinetics: Liberation and absorption (onset of action): WNL Distribution (time to peak effect): WNL Metabolism and excretion (duration of action): WNL         Pharmacodynamics: Desired effects: Analgesia: Cynthia Jones reports >50% benefit. Functional ability: Patient reports that medication allows her to accomplish basic ADLs Clinically meaningful improvement in function (CMIF): Sustained CMIF goals met Perceived effectiveness: Described as relatively effective, allowing for increase in activities of daily living (ADL) Undesirable effects: Side-effects or Adverse reactions: None reported Monitoring: [place] PMP: Online review of the past 32-monthperiod conducted. Compliant with practice rules and regulations Last UDS on record: Summary  Date Value Ref Range Status  02/10/2017 FINAL  Final    Comment:    ==================================================================== TOXASSURE SELECT 13 (MW) ==================================================================== Test                             Result       Flag       Units Drug Present and Declared for Prescription Verification   Oxycodone                      141          EXPECTED   ng/mg creat   Noroxycodone                   938          EXPECTED   ng/mg creat    Sources of oxycodone include scheduled prescription medications.    Noroxycodone is an expected metabolite of oxycodone. Drug Absent but Declared for Prescription Verification   Alprazolam                     Not Detected UNEXPECTED ng/mg creat ==================================================================== Test                      Result    Flag   Units      Ref Range   Creatinine              146              mg/dL      >=20 ==================================================================== Declared Medications:  The flagging and interpretation on this report are  based on the  following declared medications.  Unexpected results may arise from  inaccuracies in the declared medications.  **Note: The testing scope of this panel includes these medications:  Alprazolam  Oxycodone  **Note: The testing scope of this panel does not include following  reported medications:  Carvedilol  Chlorhexidine  Duloxetine  Esomeprazole  Losartan (Losartan Potassium)  Nystatin ==================================================================== For clinical consultation, please call ([phone] ====================================================================    UDS interpretation: Compliant          Medication Assessment Form: Reviewed. Patient indicates being compliant with therapy Treatment compliance: Compliant Risk Assessment Profile: Aberrant behavior: See prior evaluations. None observed or detected today Comorbid factors increasing risk of overdose: See prior notes. No additional risks detected today Risk of substance use disorder (SUD): Low Opioid Risk Tool - 08/16/17 1430      Family History of Substance Abuse   Alcohol  Negative    Illegal Drugs  Negative    Rx Drugs  Negative      Personal History of Substance Abuse   Alcohol  Negative    Illegal Drugs  Negative    Rx Drugs  Negative      Age   Age between 54-45 years   No      History of Preadolescent Sexual Abuse   History of Preadolescent Sexual Abuse  Negative or Female      Psychological Disease   Psychological Disease  Negative    Depression  Negative      Total Score   Opioid Risk Tool Scoring  0    Opioid Risk Interpretation  Low Risk      ORT Scoring interpretation table:  Score <3 = Low Risk for SUD  Score between 4-7 = Moderate Risk for SUD  Score >8 = High Risk for Opioid Abuse   Risk Mitigation Strategies:  Patient Counseling: Covered Patient-Prescriber Agreement (PPA): Present and active  Notification to other healthcare providers: Done  Pharmacologic  Plan: No change in therapy, at this time  Laboratory Chemistry  Inflammation Markers (CRP: Acute Phase) (ESR: Chronic Phase) Lab Results  Component Value Date   CRP 1.1 (H) 09/24/2016   ESRSEDRATE 44 (H) 09/24/2016                 Rheumatology Markers No results found for: Elayne Guerin, [facility]              Renal Function Markers Lab Results  Component Value Date   BUN 12 04/12/2017   CREATININE 0.88 04/12/2017   GFRAA 75 04/12/2017   GFRNONAA 65 04/12/2017                 Hepatic Function Markers Lab Results  Component Value Date   AST 16 04/12/2017   ALT 13 04/12/2017   ALBUMIN 4.1 04/12/2017   ALKPHOS 49 04/12/2017                 Electrolytes Lab Results  Component Value Date   NA 139 04/12/2017   K 4.2 04/12/2017   CL 103 04/12/2017   CALCIUM 9.6 04/12/2017   MG 2.1 09/24/2016                 Neuropathy Markers Lab Results  Component Value Date   VITAMINB12 344 09/24/2016   HGBA1C 6.6 04/08/2016                 Bone Pathology Markers Lab Results  Component Value Date   25OHVITD1 20 (L) 09/24/2016   25OHVITD2 <1.0 09/24/2016   25OHVITD3 19 09/24/2016                 Coagulation Parameters Lab Results  Component Value Date   PLT 328 04/12/2017                 Cardiovascular Markers Lab Results  Component Value Date   HGB 13.2 04/12/2017   HCT 39.9 04/12/2017                 CA Markers No results found for: CEA, CA125, LABCA2               Note: Lab results reviewed.  Recent Diagnostic Imaging Results  DG Wrist Complete Left CLINICAL DATA:  Acute wrist pain following fall 2 days ago. Initial encounter.  EXAM: LEFT WRIST - COMPLETE 3+ VIEW  COMPARISON:  09/08/2016 radiographs  FINDINGS: There is no evidence of acute fracture, subluxation or dislocation.  Degenerative changes at the first carpometacarpal joint again identified.  Calcification along dorsal wrist is compatible with prior  trauma.  IMPRESSION: No evidence of acute abnormality  Electronically  Signed   By: Margarette Canada M.D.   On: 08/01/2017 16:45  Complexity Note: Imaging results reviewed. Results shared with Ms. Bai, using Layman's terms.                         Meds   Current Outpatient Medications:  .  ALPRAZolam (XANAX) 0.25 MG tablet, Take 1 tablet (0.25 mg total) by mouth 2 (two) times daily as needed for anxiety., Disp: 60 tablet, Rfl: 4 .  carvedilol (COREG) 12.5 MG tablet, Take 1 tablet (12.5 mg total) by mouth 2 (two) times daily with a meal., Disp: 180 tablet, Rfl: 4 .  DULoxetine (CYMBALTA) 60 MG capsule, Take 1 capsule (60 mg total) by mouth daily., Disp: 90 capsule, Rfl: 4 .  esomeprazole (NEXIUM) 40 MG capsule, Take 40 mg by mouth daily at 12 noon., Disp: , Rfl:  .  glucose blood (IGLUCOSE TEST STRIPS) test strip, Use as instructed - freestyle insulin x strip, Disp: 100 each, Rfl: 12 .  losartan (COZAAR) 100 MG tablet, TAKE 1 TABLET (100 MG TOTAL) BY MOUTH DAILY., Disp: 90 tablet, Rfl: 2 .  nystatin (NYSTATIN) powder, Apply topically 4 (four) times daily., Disp: 45 g, Rfl: 4 .  predniSONE (DELTASONE) 10 MG tablet, Take 1 tablet (10 mg total) by mouth daily., Disp: 42 tablet, Rfl: 0 .  [START ON 10/22/2017] oxyCODONE (OXY IR/ROXICODONE) 5 MG immediate release tablet, Take 1 tablet (5 mg total) by mouth 2 (two) times daily., Disp: 60 tablet, Rfl: 0 .  [START ON 09/22/2017] oxyCODONE (OXY IR/ROXICODONE) 5 MG immediate release tablet, Take 1 tablet (5 mg total) by mouth 2 (two) times daily., Disp: 60 tablet, Rfl: 0 .  [START ON 08/23/2017] oxyCODONE (OXY IR/ROXICODONE) 5 MG immediate release tablet, Take 1 tablet (5 mg total) by mouth 2 (two) times daily., Disp: 60 tablet, Rfl: 0  ROS  Constitutional: Denies any fever or chills Gastrointestinal: No reported hemesis, hematochezia, vomiting, or acute GI distress Musculoskeletal: Denies any acute onset joint swelling, redness, loss of ROM, or  weakness Neurological: No reported episodes of acute onset apraxia, aphasia, dysarthria, agnosia, amnesia, paralysis, loss of coordination, or loss of consciousness  Allergies  Ms. Gutmann is allergic to nsaids and erythromycin.  [place]  Drug: Ms. Jane  reports that she does not use drugs. Alcohol:  reports that she does not drink alcohol. Tobacco:  reports that  has never smoked. she has never used smokeless tobacco. Medical:  has a past medical history of Acute anxiety (03/06/2015), Allergy, Anxiety, Degenerative joint disease of knee, Depression, Fibromyalgia, Obesity, Osteopenia, and RAD (reactive airway disease). Surgical: Ms. Schrom  has a past surgical history that includes Parotidectomy (Left); Appendectomy; Cholecystectomy; Abdominal hysterectomy; Tonsillectomy; Joint replacement (2014); and Eye surgery. Family: family history includes Alzheimer's disease in her father; Cancer in her sister; Diabetes in her mother; Heart disease in her mother; Parkinson's disease in her father; Stroke in her mother.  Constitutional Exam  General appearance: Well nourished, well developed, and well hydrated. In no apparent acute distress Vitals:   08/16/17 1414  BP: 95/76  Pulse: 67  Temp: 97.7 F (36.5 C)  SpO2: 100%  Weight: 222 lb (100.7 kg)  Height: 5' (1.524 m)   BMI Assessment: Estimated body mass index is 43.36 kg/m as calculated from the following:   Height as of this encounter: 5' (1.524 m).   Weight as of this encounter: 222 lb (100.7 kg). Psych/Mental status: Alert, oriented x 3 (person,  place, & time)       Eyes: PERLA Respiratory: No evidence of acute respiratory distress  Cervical Spine Area Exam  Skin & Axial Inspection: No masses, redness, edema, swelling, or associated skin lesions Alignment: Symmetrical Functional ROM: Unrestricted ROM      Stability: No instability detected Muscle Tone/Strength: Functionally intact. No obvious neuro-muscular anomalies  detected. Sensory (Neurological): Unimpaired Palpation: No palpable anomalies              Upper Extremity (UE) Exam    Side: Right upper extremity  Side: Left upper extremity  Skin & Extremity Inspection: Skin color, temperature, and hair growth are WNL. No peripheral edema or cyanosis. No masses, redness, swelling, asymmetry, or associated skin lesions. No contractures.  Skin & Extremity Inspection: brace worn  Functional ROM: Unrestricted ROM          Functional ROM: Unrestricted ROM          Muscle Tone/Strength: Functionally intact. No obvious neuro-muscular anomalies detected.  Muscle Tone/Strength: Functionally intact. No obvious neuro-muscular anomalies detected.  Sensory (Neurological): Unimpaired          Sensory (Neurological): Unimpaired          Palpation: No palpable anomalies              Palpation: No palpable anomalies              Specialized Test(s): Deferred         Specialized Test(s): Deferred          Thoracic Spine Area Exam  Skin & Axial Inspection: No masses, redness, or swelling Alignment: Symmetrical Functional ROM: Unrestricted ROM Stability: No instability detected Muscle Tone/Strength: Functionally intact. No obvious neuro-muscular anomalies detected. Sensory (Neurological): Unimpaired Muscle strength & Tone: No palpable anomalies  Lumbar Spine Area Exam  Skin & Axial Inspection: No masses, redness, or swelling Alignment: Symmetrical Functional ROM: Unrestricted ROM      Stability: No instability detected Muscle Tone/Strength: Functionally intact. No obvious neuro-muscular anomalies detected. Sensory (Neurological): Unimpaired Palpation: No palpable anomalies       Provocative Tests: Lumbar Hyperextension and rotation test: evaluation deferred today       Lumbar Lateral bending test: evaluation deferred today       Patrick's Maneuver: evaluation deferred today                    Gait & Posture Assessment  Ambulation: Unassisted Gait: Relatively  normal for age and body habitus Posture: WNL   Lower Extremity Exam    Side: Right lower extremity  Side: Left lower extremity  Skin & Extremity Inspection: Skin color, temperature, and hair growth are WNL. No peripheral edema or cyanosis. No masses, redness, swelling, asymmetry, or associated skin lesions. No contractures.  Skin & Extremity Inspection: Skin color, temperature, and hair growth are WNL. No peripheral edema or cyanosis. No masses, redness, swelling, asymmetry, or associated skin lesions. No contractures.  Functional ROM: Unrestricted ROM          Functional ROM: Unrestricted ROM          Muscle Tone/Strength: Functionally intact. No obvious neuro-muscular anomalies detected.  Muscle Tone/Strength: Functionally intact. No obvious neuro-muscular anomalies detected.  Sensory (Neurological): Unimpaired  Sensory (Neurological): Unimpaired  Palpation: No palpable anomalies  Palpation: No palpable anomalies   Assessment  Primary Diagnosis & Pertinent Problem List: The primary encounter diagnosis was Chronic low back pain (Location of Secondary source of pain) (Bilateral) (L>R). Diagnoses of Chronic pain syndrome, Chronic  knee pain (Location of Primary Source of Pain) (Bilateral) (L>R), and Chronic hip pain (Bilateral) were also pertinent to this visit.  Status Diagnosis  Controlled Controlled Controlled 1. Chronic low back pain (Location of Secondary source of pain) (Bilateral) (L>R)   2. Chronic pain syndrome   3. Chronic knee pain (Location of Primary Source of Pain) (Bilateral) (L>R)   4. Chronic hip pain (Bilateral)     Problems updated and reviewed during this visit: No problems updated. Plan of Care  Pharmacotherapy (Medications Ordered): Meds ordered this encounter  Medications  . oxyCODONE (OXY IR/ROXICODONE) 5 MG immediate release tablet    Sig: Take 1 tablet (5 mg total) by mouth 2 (two) times daily.    Dispense:  60 tablet    Refill:  0    Fill one day early if  pharmacy is closed on scheduled refill date. Do not fill until: 10/22/2017 To last until: 11/21/2017    Order Specific Question:   Supervising Provider    Answer:   Milinda Pointer [phone]  . oxyCODONE (OXY IR/ROXICODONE) 5 MG immediate release tablet    Sig: Take 1 tablet (5 mg total) by mouth 2 (two) times daily.    Dispense:  60 tablet    Refill:  0    Fill one day early if pharmacy is closed on scheduled refill date. Do not fill until: 09/22/2017 To last until: 10/22/2017    Order Specific Question:   Supervising Provider    Answer:   Milinda Pointer [phone]  . oxyCODONE (OXY IR/ROXICODONE) 5 MG immediate release tablet    Sig: Take 1 tablet (5 mg total) by mouth 2 (two) times daily.    Dispense:  60 tablet    Refill:  0    Fill one day early if pharmacy is closed on scheduled refill date. Do not fill until: 08/23/2017 To last until:09/22/2017    Order Specific Question:   Supervising Provider    Answer:   Milinda Pointer [phone]  This SmartLink is deprecated. Use AVSMEDLIST instead to display the medication list for a patient. Medications administered today: Grayling Congress had no medications administered during this visit. Lab-work, procedure(s), and/or referral(s): No orders of the defined types were placed in this encounter.  Imaging and/or referral(s): None  Interventional therapies: Planned, scheduled, and/or pending:  Not at this time.   Considering:  Diagnostic right Genicular nerve block Possible right Genicular nerve radiofrequencyablation  Diagnosticleft intra-articular knee joint injection Possible series of 5 left intra-articular Hyalgan knee injections Diagnostic left Genicular nerve block Possibleleft Genicular nerve RFA Diagnostic bilateral lumbar facet block Possible bilateral lumbar facet RFA Diagnostic left lumbar epidural steroid injection Diagnostic caudal epidural steroid injection + epidurogram Possible Racz  procedure Diagnostic bilateral cervical facet block Possible bilateral cervical facet RFA Diagnostic right versus left cervical epidural steroid injection Diagnostic bilateral intra-articular shoulder joint injection Diagnostic bilateral suprascapular nerve block Possible bilateral suprascapular nerve RFA   Palliative PRN treatment(s):  PalliativebilateralGenicular nerve block Palliativeleft intra-articular knee joint injection Palliativebilateral lumbar facet block  Palliativeleft lumbar epidural steroid injection  Palliativecaudal epiduralsteroid injection + epidurogram  Palliativebilateral cervical facet block Palliativeright versus left cervical epidural steroid injection  Palliativebilateral intra-articular shoulderjoint injection  Palliativebilateral suprascapularnerve block    Provider-requested follow-up: Return in about 3 months (around 11/14/2017).  Future Appointments  Date Time Provider [place]  10/13/2017  1:30 PM Guadalupe Maple, MD [facility]  11/10/2017  1:30 PM Vevelyn Francois, NP [facility] None   Primary Care Physician:  Guadalupe Maple, MD Location: [facility] Outpatient [facility] Management Facility Note by: Vevelyn Francois NP Date: 08/16/2017; Time: 8:24 AM  Pain Score Disclaimer: We use the NRS-11 scale. This is a self-reported, subjective measurement of pain severity with only modest accuracy. It is used primarily to identify changes within a particular patient. It must be understood that outpatient pain scales are significantly less accurate that those used for research, where they can be applied under ideal controlled circumstances with minimal exposure to variables. In reality, the score is likely to be a combination of pain intensity and pain affect, where pain affect describes the degree of emotional arousal or changes in action readiness caused by the sensory experience of pain. Factors such as social and work situation, setting,  emotional state, anxiety levels, expectation, and prior pain experience may influence pain perception and show large inter-individual differences that may also be affected by time variables.  Patient instructions provided during this appointment: Patient Instructions    ____________________________________________________________________________________________  Medication Rules  Applies to: All patients receiving prescriptions (written or electronic).  Pharmacy of record: Pharmacy where electronic prescriptions will be sent. If written prescriptions are taken to a different pharmacy, please inform the nursing staff. The pharmacy listed in the electronic medical record should be the one where you would like electronic prescriptions to be sent.  Prescription refills: Only during scheduled appointments. Applies to both, written and electronic prescriptions.  NOTE: The following applies primarily to controlled substances (Opioid* Pain Medications).   Patient's responsibilities: 1. Pain Pills: Bring all pain pills to every appointment (except for procedure appointments). 2. Pill Bottles: Bring pills in original pharmacy bottle. Always bring newest bottle. Bring bottle, even if empty. 3. Medication refills: You are responsible for knowing and keeping track of what medications you need refilled. The day before your appointment, write a list of all prescriptions that need to be refilled. Bring that list to your appointment and give it to the admitting nurse. Prescriptions will be written only during appointments. If you forget a medication, it will not be "Called in", "Faxed", or "electronically sent". You will need to get another appointment to get these prescribed. 4. Prescription Accuracy: You are responsible for carefully inspecting your prescriptions before leaving our office. Have the discharge nurse carefully go over each prescription with you, before taking them home. Make sure that your name  is accurately spelled, that your address is correct. Check the name and dose of your medication to make sure it is accurate. Check the number of pills, and the written instructions to make sure they are clear and accurate. Make sure that you are given enough medication to last until your next medication refill appointment. 5. Taking Medication: Take medication as prescribed. Never take more pills than instructed. Never take medication more frequently than prescribed. Taking less pills or less frequently is permitted and encouraged, when it comes to controlled substances (written prescriptions).  6. Inform other Doctors: Always inform, all of your healthcare providers, of all the medications you take. 7. Pain Medication from other Providers: You are not allowed to accept any additional pain medication from any other Doctor or Healthcare provider. There are two exceptions to this rule. (see below) In the event that you require additional pain medication, you are responsible for notifying us, as stated below. 8. Medication Agreement: You are responsible for carefully reading and following our Medication Agreement. This must be signed before receiving any prescriptions from our practice. Safely store a copy of your signed Agreement.  Violations to the Agreement will result in no further prescriptions. (Additional copies of our Medication Agreement are available upon request.) 9. Laws, Rules, & Regulations: All patients are expected to follow all Federal and [employer], [employer], Rules, [employer] [facility]. Ignorance of the Laws does not constitute a valid excuse. The use of any illegal substances is prohibited. 10. Adopted CDC guidelines & recommendations: Target dosing levels will be at or below 60 MME/day. Use of benzodiazepines** is not recommended.  Exceptions: There are only two exceptions to the rule of not receiving pain medications from other Healthcare Providers. 1. Exception #1 (Emergencies): In the event of  an emergency (i.e.: accident requiring emergency care), you are allowed to receive additional pain medication. However, you are responsible for: As soon as you are able, call our office [phone], at any time of the day or night, and leave a message stating your name, the date and nature of the emergency, and the name and dose of the medication prescribed. In the event that your call is answered by a member of our staff, make sure to document and save the date, time, and the name of the person that took your information.  2. Exception #2 (Planned Surgery): In the event that you are scheduled by another doctor or dentist to have any type of surgery or procedure, you are allowed (for a period no longer than 30 days), to receive additional pain medication, for the acute post-op pain. However, in this case, you are responsible for picking up a copy of our "Post-op Pain Management for Surgeons" handout, and giving it to your surgeon or dentist. This document is available at our office, and does not require an appointment to obtain it. Simply go to our office during business hours (Monday-Thursday from 8:00 AM to 4:00 PM) (Friday 8:00 AM to 12:00 Noon) or if you have a scheduled appointment with [place], prior to your surgery, and ask for it by name. In addition, you will need to provide [place] with your name, name of your surgeon, type of surgery, and date of procedure or surgery.  *Opioid medications include: morphine, codeine, oxycodone, oxymorphone, hydrocodone, hydromorphone, meperidine, tramadol, tapentadol, buprenorphine, fentanyl, methadone. **Benzodiazepine medications include: diazepam (Valium), alprazolam (Xanax), clonazepam (Klonopine), lorazepam (Ativan), clorazepate (Tranxene), chlordiazepoxide (Librium), estazolam (Prosom), oxazepam (Serax), temazepam (Restoril), triazolam (Halcion)  ____________________________________________________________________________________________   BMI interpretation  table: BMI level Category Range association with higher incidence of chronic pain  <18 kg/m2 Underweight   18.5-24.9 kg/m2 Ideal body weight   25-29.9 kg/m2 Overweight Increased incidence by 20%  30-34.9 kg/m2 Obese (Class I) Increased incidence by 68%  35-39.9 kg/m2 Severe obesity (Class II) Increased incidence by 136%  >40 kg/m2 Extreme obesity (Class III) Increased incidence by 254%   BMI Readings from Last 4 Encounters:  08/16/17 43.36 kg/m  08/01/17 43.36 kg/m  06/03/17 43.36 kg/m  05/12/17 43.36 kg/m   Wt Readings from Last 4 Encounters:  08/16/17 222 lb (100.7 kg)  08/01/17 222 lb (100.7 kg)  06/03/17 222 lb (100.7 kg)  05/12/17 222 lb (100.7 kg)

## 2017-08-16 NOTE — Progress Notes (Signed)
Nursing Pain Medication Assessment:  Safety precautions to be maintained throughout the outpatient stay will include: orient to surroundings, keep bed in low position, maintain call bell within reach at all times, provide assistance with transfer out of bed and ambulation.  Medication Inspection Compliance: Pill count conducted under aseptic conditions, in front of the patient. Neither the pills nor the bottle was removed from the patient's sight at any time. Once count was completed pills were immediately returned to the patient in their original bottle.  Medication: Oxycodone IR Pill/Patch Count: 13 of 60 pills remain Pill/Patch Appearance: Markings consistent with prescribed medication Bottle Appearance: Standard pharmacy container. Clearly labeled. Filled Date: 11/24/ 2018 Last Medication intake:  Today

## 2017-08-16 NOTE — Patient Instructions (Addendum)
____________________________________________________________________________________________  Medication Rules  Applies to: All patients receiving prescriptions (written or electronic).  Pharmacy of record: Pharmacy where electronic prescriptions will be sent. If written prescriptions are taken to a different pharmacy, please inform the nursing staff. The pharmacy listed in the electronic medical record should be the one where you would like electronic prescriptions to be sent.  Prescription refills: Only during scheduled appointments. Applies to both, written and electronic prescriptions.  NOTE: The following applies primarily to controlled substances (Opioid* Pain Medications).   Patient's responsibilities: 1. Pain Pills: Bring all pain pills to every appointment (except for procedure appointments). 2. Pill Bottles: Bring pills in original pharmacy bottle. Always bring newest bottle. Bring bottle, even if empty. 3. Medication refills: You are responsible for knowing and keeping track of what medications you need refilled. The day before your appointment, write a list of all prescriptions that need to be refilled. Bring that list to your appointment and give it to the admitting nurse. Prescriptions will be written only during appointments. If you forget a medication, it will not be "Called in", "Faxed", or "electronically sent". You will need to get another appointment to get these prescribed. 4. Prescription Accuracy: You are responsible for carefully inspecting your prescriptions before leaving our office. Have the discharge nurse carefully go over each prescription with you, before taking them home. Make sure that your name is accurately spelled, that your address is correct. Check the name and dose of your medication to make sure it is accurate. Check the number of pills, and the written instructions to make sure they are clear and accurate. Make sure that you are given enough medication to  last until your next medication refill appointment. 5. Taking Medication: Take medication as prescribed. Never take more pills than instructed. Never take medication more frequently than prescribed. Taking less pills or less frequently is permitted and encouraged, when it comes to controlled substances (written prescriptions).  6. Inform other Doctors: Always inform, all of your healthcare providers, of all the medications you take. 7. Pain Medication from other Providers: You are not allowed to accept any additional pain medication from any other Doctor or Healthcare provider. There are two exceptions to this rule. (see below) In the event that you require additional pain medication, you are responsible for notifying us, as stated below. 8. Medication Agreement: You are responsible for carefully reading and following our Medication Agreement. This must be signed before receiving any prescriptions from our practice. Safely store a copy of your signed Agreement. Violations to the Agreement will result in no further prescriptions. (Additional copies of our Medication Agreement are available upon request.) 9. Laws, Rules, & Regulations: All patients are expected to follow all Federal and State Laws, Statutes, Rules, & Regulations. Ignorance of the Laws does not constitute a valid excuse. The use of any illegal substances is prohibited. 10. Adopted CDC guidelines & recommendations: Target dosing levels will be at or below 60 MME/day. Use of benzodiazepines** is not recommended.  Exceptions: There are only two exceptions to the rule of not receiving pain medications from other Healthcare Providers. 1. Exception #1 (Emergencies): In the event of an emergency (i.e.: accident requiring emergency care), you are allowed to receive additional pain medication. However, you are responsible for: As soon as you are able, call our office [phone], at any time of the day or night, and leave a message stating your  name, the date and nature of the emergency, and the name and dose of the medication   prescribed. In the event that your call is answered by a member of our staff, make sure to document and save the date, time, and the name of the person that took your information.  2. Exception #2 (Planned Surgery): In the event that you are scheduled by another doctor or dentist to have any type of surgery or procedure, you are allowed (for a period no longer than 30 days), to receive additional pain medication, for the acute post-op pain. However, in this case, you are responsible for picking up a copy of our "Post-op Pain Management for Surgeons" handout, and giving it to your surgeon or dentist. This document is available at our office, and does not require an appointment to obtain it. Simply go to our office during business hours (Monday-Thursday from 8:00 AM to 4:00 PM) (Friday 8:00 AM to 12:00 Noon) or if you have a scheduled appointment with [place], prior to your surgery, and ask for it by name. In addition, you will need to provide [place] with your name, name of your surgeon, type of surgery, and date of procedure or surgery.  *Opioid medications include: morphine, codeine, oxycodone, oxymorphone, hydrocodone, hydromorphone, meperidine, tramadol, tapentadol, buprenorphine, fentanyl, methadone. **Benzodiazepine medications include: diazepam (Valium), alprazolam (Xanax), clonazepam (Klonopine), lorazepam (Ativan), clorazepate (Tranxene), chlordiazepoxide (Librium), estazolam (Prosom), oxazepam (Serax), temazepam (Restoril), triazolam (Halcion)  ____________________________________________________________________________________________   BMI interpretation table: BMI level Category Range association with higher incidence of chronic pain  <18 kg/m2 Underweight   18.5-24.9 kg/m2 Ideal body weight   25-29.9 kg/m2 Overweight Increased incidence by 20%  30-34.9 kg/m2 Obese (Class I) Increased incidence by 68%  35-39.9 kg/m2  Severe obesity (Class II) Increased incidence by 136%  >40 kg/m2 Extreme obesity (Class III) Increased incidence by 254%   BMI Readings from Last 4 Encounters:  08/16/17 43.36 kg/m  08/01/17 43.36 kg/m  06/03/17 43.36 kg/m  05/12/17 43.36 kg/m   Wt Readings from Last 4 Encounters:  08/16/17 222 lb (100.7 kg)  08/01/17 222 lb (100.7 kg)  06/03/17 222 lb (100.7 kg)  05/12/17 222 lb (100.7 kg)

## 2017-09-02 ENCOUNTER — Encounter: Payer: Self-pay | Admitting: Family Medicine

## 2017-09-02 ENCOUNTER — Ambulatory Visit (INDEPENDENT_AMBULATORY_CARE_PROVIDER_SITE_OTHER): Payer: Medicare Other | Admitting: Family Medicine

## 2017-09-02 VITALS — BP 124/73 | HR 71 | Temp 97.8°F | Wt 225.0 lb

## 2017-09-02 DIAGNOSIS — J0101 Acute recurrent maxillary sinusitis: Secondary | ICD-10-CM

## 2017-09-02 DIAGNOSIS — L0292 Furuncle, unspecified: Secondary | ICD-10-CM

## 2017-09-02 MED ORDER — DOXYCYCLINE HYCLATE 100 MG PO TABS: 100.0000 mg | ORAL_TABLET | Freq: Two times a day (BID) | ORAL | 0 refills | Status: DC

## 2017-09-02 NOTE — Progress Notes (Signed)
BP 124/73   Pulse 71   Temp 97.8 F (36.6 C) (Oral)   Wt 225 lb (102.1 kg)   SpO2 97%   BMI 43.94 kg/m    Subjective:    Patient ID: Cynthia Jones, female    DOB: 05/10/1941, 77 y.o.   MRN: [id number]  HPI: Cynthia Jones is a 77 y.o. female  Chief Complaint  Patient presents with  . Sinus Problem    Facial pain.    Facial pain and pressure, congestion, headache, ear pressure x almost a week. Has been taking coricidin HBP with minimal relief. Long hx of sinusitis. Denies fevers, chills, sweats, body aches, CP, SOB.   Boil on right wrist x 1 week. Significantly painful, draining some. Has been using soap and water, neosporin, and patch bandages. Tends to get these several times a year.   Past Medical History:  Diagnosis Date  . Acute anxiety 03/06/2015  . Allergy   . Anxiety   . Degenerative joint disease of knee   . Depression   . Fibromyalgia   . Obesity   . Osteopenia   . RAD (reactive airway disease)    Social History   Socioeconomic History  . Marital status: Single    Spouse name: Not on file  . Number of children: Not on file  . Years of education: Not on file  . Highest education level: Not on file  Social Needs  . Financial resource strain: Not on file  . Food insecurity - worry: Not on file  . Food insecurity - inability: Not on file  . Transportation needs - medical: Not on file  . Transportation needs - non-medical: Not on file  Occupational History  . Not on file  Tobacco Use  . Smoking status: Never Smoker  . Smokeless tobacco: Never Used  Substance and Sexual Activity  . Alcohol use: No    Alcohol/week: 0.0 oz  . Drug use: No  . Sexual activity: Not on file  Other Topics Concern  . Not on file  Social History Narrative  . Not on file    Relevant past medical, surgical, family and social history reviewed and updated as indicated. Interim medical history since our last visit reviewed. Allergies and medications reviewed and  updated.  Review of Systems  Constitutional: Negative.   HENT: Positive for congestion, ear pain, sinus pressure and sinus pain.   Eyes: Negative.   Respiratory: Negative.   Cardiovascular: Negative.   Gastrointestinal: Negative.   Genitourinary: Negative.   Musculoskeletal: Negative.   Skin: Positive for wound.  Neurological: Positive for headaches.  Psychiatric/Behavioral: Negative.     Per HPI unless specifically indicated above     Objective:    BP 124/73   Pulse 71   Temp 97.8 F (36.6 C) (Oral)   Wt 225 lb (102.1 kg)   SpO2 97%   BMI 43.94 kg/m   Wt Readings from Last 3 Encounters:  09/02/17 225 lb (102.1 kg)  08/16/17 222 lb (100.7 kg)  08/01/17 222 lb (100.7 kg)    Physical Exam  Constitutional: She is oriented to person, place, and time. She appears well-developed and well-nourished. No distress.  HENT:  Head: Atraumatic.  Mouth/Throat: No oropharyngeal exudate.  B/l sinus ttp Oropharynx and nasal mucosa injected Middle ear effusion left  Eyes: Conjunctivae are normal. Pupils are equal, round, and reactive to light. No scleral icterus.  Neck: Normal range of motion. Neck supple.  Cardiovascular: Normal rate and normal heart sounds.  Pulmonary/Chest: Effort normal and breath sounds normal. No respiratory distress.  Musculoskeletal: Normal range of motion.  Lymphadenopathy:    She has no cervical adenopathy.  Neurological: She is alert and oriented to person, place, and time.  Skin: Skin is warm and dry.  2 erythematous and edematous, firm boils side by side on right wrist. No present drainage, streaking  Psychiatric: She has a normal mood and affect. Her behavior is normal.  Nursing note and vitals reviewed.  Results for orders placed or performed in visit on 04/12/17  CBC with Differential/Platelet  Result Value Ref Range   WBC 7.7 3.4 - 10.8 x10E3/uL   RBC 4.29 3.77 - 5.28 x10E6/uL   Hemoglobin 13.2 11.1 - 15.9 g/dL   Hematocrit 42.5 95.6 - 46.6  %   MCV 93 79 - 97 fL   MCH 30.8 26.6 - 33.0 pg   MCHC 33.1 31.5 - 35.7 g/dL   RDW 38.7 56.4 - 33.2 %   Platelets 328 150 - 379 x10E3/uL   Neutrophils 65 Not Estab. %   Lymphs 24 Not Estab. %   Monocytes 8 Not Estab. %   Eos 2 Not Estab. %   Basos 1 Not Estab. %   Neutrophils Absolute 5.1 1.4 - 7.0 x10E3/uL   Lymphocytes Absolute 1.8 0.7 - 3.1 x10E3/uL   Monocytes Absolute 0.6 0.1 - 0.9 x10E3/uL   EOS (ABSOLUTE) 0.2 0.0 - 0.4 x10E3/uL   Basophils Absolute 0.0 0.0 - 0.2 x10E3/uL   Immature Granulocytes 0 Not Estab. %   Immature Grans (Abs) 0.0 0.0 - 0.1 x10E3/uL  Comprehensive metabolic panel  Result Value Ref Range   Glucose 136 (H) 65 - 99 mg/dL   BUN 12 8 - 27 mg/dL   Creatinine, Ser 9.51 0.57 - 1.00 mg/dL   GFR calc non Af Amer 65 >59 mL/min/1.73   GFR calc Af Amer 75 >59 mL/min/1.73   BUN/Creatinine Ratio 14 12 - 28   Sodium 139 134 - 144 mmol/L   Potassium 4.2 3.5 - 5.2 mmol/L   Chloride 103 96 - 106 mmol/L   CO2 22 20 - 29 mmol/L   Calcium 9.6 8.7 - 10.3 mg/dL   Total Protein 7.2 6.0 - 8.5 g/dL   Albumin 4.1 3.5 - 4.8 g/dL   Globulin, Total 3.1 1.5 - 4.5 g/dL   Albumin/Globulin Ratio 1.3 1.2 - 2.2   Bilirubin Total 0.4 0.0 - 1.2 mg/dL   Alkaline Phosphatase 49 39 - 117 IU/L   AST 16 0 - 40 IU/L   ALT 13 0 - 32 IU/L  Lipid panel  Result Value Ref Range   Cholesterol, Total 190 100 - 199 mg/dL   Triglycerides 884 0 - 149 mg/dL   HDL 65 >16 mg/dL   VLDL Cholesterol Cal 24 5 - 40 mg/dL   LDL Calculated 606 (H) 0 - 99 mg/dL   Chol/HDL Ratio 2.9 0.0 - 4.4 ratio  TSH  Result Value Ref Range   TSH 5.610 (H) 0.450 - 4.500 uIU/mL  Urinalysis, Routine w reflex microscopic  Result Value Ref Range   Specific Gravity, UA 1.020 1.005 - 1.030   pH, UA 5.5 5.0 - 7.5   Color, UA Yellow Yellow   Appearance Ur Clear Clear   Leukocytes, UA Negative Negative   Protein, UA Negative Negative/Trace   Glucose, UA Negative Negative   Ketones, UA Negative Negative   RBC, UA  Negative Negative   Bilirubin, UA Negative Negative   Urobilinogen, Ur 0.2  0.2 - 1.0 mg/dL   Nitrite, UA Negative Negative  Bayer DCA Hb A1c Waived  Result Value Ref Range   Bayer DCA Hb A1c Waived 6.2 <7.0 %      Assessment & Plan:   Problem List Items Addressed This Visit    None    Visit Diagnoses    Acute recurrent maxillary sinusitis    -  Primary   Will tx with doxycycline, sinus rinses, humidifier, and plain mucinex. F/u if no improvement   Relevant Medications   doxycycline (VIBRA-TABS) 100 MG tablet   Boil       Hoping doxy will treat sinusitis as well as her boils. Warm compresses, hibiclens, neosporin, bandages. F/u if no improvement       Follow up plan: Return if symptoms worsen or fail to improve.

## 2017-09-05 NOTE — Patient Instructions (Signed)
Follow up as needed

## 2017-10-13 ENCOUNTER — Encounter: Payer: Self-pay | Admitting: Family Medicine

## 2017-10-13 ENCOUNTER — Telehealth: Payer: Self-pay | Admitting: Family Medicine

## 2017-10-13 ENCOUNTER — Ambulatory Visit (INDEPENDENT_AMBULATORY_CARE_PROVIDER_SITE_OTHER): Payer: Medicare Other | Admitting: Family Medicine

## 2017-10-13 VITALS — BP 122/60 | HR 84 | Wt 226.0 lb

## 2017-10-13 DIAGNOSIS — E118 Type 2 diabetes mellitus with unspecified complications: Secondary | ICD-10-CM

## 2017-10-13 DIAGNOSIS — E1340 Other specified diabetes mellitus with diabetic neuropathy, unspecified: Secondary | ICD-10-CM | POA: Diagnosis not present

## 2017-10-13 DIAGNOSIS — E7800 Pure hypercholesterolemia, unspecified: Secondary | ICD-10-CM

## 2017-10-13 DIAGNOSIS — I10 Essential (primary) hypertension: Secondary | ICD-10-CM | POA: Diagnosis not present

## 2017-10-13 DIAGNOSIS — L739 Follicular disorder, unspecified: Secondary | ICD-10-CM | POA: Diagnosis not present

## 2017-10-13 LAB — LP+ALT+AST PICCOLO, WAIVED
ALT (SGPT) Piccolo, Waived: 24 U/L (ref 10–47)
AST (SGOT) Piccolo, Waived: 20 U/L (ref 11–38)
CHOLESTEROL PICCOLO, WAIVED: 203 mg/dL — AB (ref <200)
Chol/HDL Ratio Piccolo,Waive: 2.7 mg/dL
HDL Chol Piccolo, Waived: 74 mg/dL (ref >59)
LDL CHOL CALC PICCOLO WAIVED: 103 mg/dL — AB (ref <100)
Triglycerides Piccolo,Waived: 129 mg/dL (ref <150)
VLDL Chol Calc Piccolo,Waive: 26 mg/dL (ref <30)

## 2017-10-13 LAB — BAYER DCA HB A1C WAIVED: HB A1C (BAYER DCA - WAIVED): 6.7 % (ref <7.0)

## 2017-10-13 MED ORDER — MUPIROCIN 2 % EX OINT: 1.0000 "application " | TOPICAL_OINTMENT | Freq: Two times a day (BID) | CUTANEOUS | 0 refills | Status: DC

## 2017-10-13 MED ORDER — POLYMYXIN B-TRIMETHOPRIM 10000-0.1 UNIT/ML-% OP SOLN: 1.0000 [drp] | OPHTHALMIC | 0 refills | Status: DC

## 2017-10-13 NOTE — Assessment & Plan Note (Signed)
The current medical regimen is effective;  continue present plan and medications.  

## 2017-10-13 NOTE — Assessment & Plan Note (Signed)
Discussed folliculitis care will use use of Bactroban ointment

## 2017-10-13 NOTE — Progress Notes (Signed)
BP 122/60 (BP Location: Right Arm)   Pulse 84   Wt 226 lb (102.5 kg)   SpO2 98%   BMI 44.14 kg/m    Subjective:    Patient ID: Cynthia Jones, female    DOB: [birth year], 77 y.o.   MRN: [id number]  HPI: Cynthia Jones is a 77 y.o. female  Chief Complaint  Patient presents with  . Follow-up  . Hypertension  . Hyperlipidemia  . Diabetes  . Eye Drainage  . Hand Pain    Pinky finger - left hand   Patient all in all doing well blood pressure good control no issues with medications taking cholesterol medicines without problems diabetes seems to be doing well. Has had some eye mattering and redness of her eyes developed over the last several days no known specific irritation.  Relevant past medical, surgical, family and social history reviewed and updated as indicated. Interim medical history since our last visit reviewed. Allergies and medications reviewed and updated.  Review of Systems  Constitutional: Negative.   Respiratory: Negative.   Cardiovascular: Negative.     Per HPI unless specifically indicated above     Objective:    BP 122/60 (BP Location: Right Arm)   Pulse 84   Wt 226 lb (102.5 kg)   SpO2 98%   BMI 44.14 kg/m   Wt Readings from Last 3 Encounters:  10/13/17 226 lb (102.5 kg)  09/02/17 225 lb (102.1 kg)  08/16/17 222 lb (100.7 kg)    Physical Exam  Constitutional: She is oriented to person, place, and time. She appears well-developed and well-nourished.  HENT:  Head: Normocephalic and atraumatic.  Eyes: Conjunctivae and EOM are normal.  Conjunctivitis changes both eyes  Neck: Normal range of motion.  Cardiovascular: Normal rate, regular rhythm and normal heart sounds.  Pulmonary/Chest: Effort normal and breath sounds normal.  Musculoskeletal: Normal range of motion.  Neurological: She is alert and oriented to person, place, and time.  Skin: No erythema.  Left posterior fifth finger some redness around a bump no streaking.    Psychiatric: She has a normal mood and affect. Her behavior is normal. Judgment and thought content normal.    Results for orders placed or performed in visit on 04/12/17  CBC with Differential/Platelet  Result Value Ref Range   WBC 7.7 3.4 - 10.8 x10E3/uL   RBC 4.29 3.77 - 5.28 x10E6/uL   Hemoglobin 13.2 11.1 - 15.9 g/dL   Hematocrit 56.3 14.9 - 46.6 %   MCV 93 79 - 97 fL   MCH 30.8 26.6 - 33.0 pg   MCHC 33.1 31.5 - 35.7 g/dL   RDW 70.2 63.7 - 85.8 %   Platelets 328 150 - 379 x10E3/uL   Neutrophils 65 Not Estab. %   Lymphs 24 Not Estab. %   Monocytes 8 Not Estab. %   Eos 2 Not Estab. %   Basos 1 Not Estab. %   Neutrophils Absolute 5.1 1.4 - 7.0 x10E3/uL   Lymphocytes Absolute 1.8 0.7 - 3.1 x10E3/uL   Monocytes Absolute 0.6 0.1 - 0.9 x10E3/uL   EOS (ABSOLUTE) 0.2 0.0 - 0.4 x10E3/uL   Basophils Absolute 0.0 0.0 - 0.2 x10E3/uL   Immature Granulocytes 0 Not Estab. %   Immature Grans (Abs) 0.0 0.0 - 0.1 x10E3/uL  Comprehensive metabolic panel  Result Value Ref Range   Glucose 136 (H) 65 - 99 mg/dL   BUN 12 8 - 27 mg/dL   Creatinine, Ser 8.50 0.57 - 1.00 mg/dL  GFR calc non Af Amer 65 >59 mL/min/1.73   GFR calc Af Amer 75 >59 mL/min/1.73   BUN/Creatinine Ratio 14 12 - 28   Sodium 139 134 - 144 mmol/L   Potassium 4.2 3.5 - 5.2 mmol/L   Chloride 103 96 - 106 mmol/L   CO2 22 20 - 29 mmol/L   Calcium 9.6 8.7 - 10.3 mg/dL   Total Protein 7.2 6.0 - 8.5 g/dL   Albumin 4.1 3.5 - 4.8 g/dL   Globulin, Total 3.1 1.5 - 4.5 g/dL   Albumin/Globulin Ratio 1.3 1.2 - 2.2   Bilirubin Total 0.4 0.0 - 1.2 mg/dL   Alkaline Phosphatase 49 39 - 117 IU/L   AST 16 0 - 40 IU/L   ALT 13 0 - 32 IU/L  Lipid panel  Result Value Ref Range   Cholesterol, Total 190 100 - 199 mg/dL   Triglycerides 542 0 - 149 mg/dL   HDL 65 >70 mg/dL   VLDL Cholesterol Cal 24 5 - 40 mg/dL   LDL Calculated 623 (H) 0 - 99 mg/dL   Chol/HDL Ratio 2.9 0.0 - 4.4 ratio  TSH  Result Value Ref Range   TSH 5.610 (H)  0.450 - 4.500 uIU/mL  Urinalysis, Routine w reflex microscopic  Result Value Ref Range   Specific Gravity, UA 1.020 1.005 - 1.030   pH, UA 5.5 5.0 - 7.5   Color, UA Yellow Yellow   Appearance Ur Clear Clear   Leukocytes, UA Negative Negative   Protein, UA Negative Negative/Trace   Glucose, UA Negative Negative   Ketones, UA Negative Negative   RBC, UA Negative Negative   Bilirubin, UA Negative Negative   Urobilinogen, Ur 0.2 0.2 - 1.0 mg/dL   Nitrite, UA Negative Negative  Bayer DCA Hb A1c Waived  Result Value Ref Range   Bayer DCA Hb A1c Waived 6.2 <7.0 %      Assessment & Plan:   Problem List Items Addressed This Visit      Cardiovascular and Mediastinum   Essential hypertension - Primary    The current medical regimen is effective;  continue present plan and medications.       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       Diabetes mellitus (HCC)    The current medical regimen is effective;  continue present plan and medications.       Relevant Orders   Basic metabolic panel   Bayer DCA Hb G3T Waived   LP+ALT+AST [place], Waived     Musculoskeletal and Integument   Folliculitis    Discussed folliculitis care will use use of Bactroban ointment        Other   Hypercholesteremia    The current medical regimen is effective;  continue present plan and medications.       Relevant Orders   Basic metabolic panel   Bayer DCA Hb D1V Waived   LP+ALT+AST [place], Waived    For cellulitis will use Bactroban For conjunctivitis will use Polytrim  Follow up plan: Return in about 6 months (around 04/12/2018) for Physical Exam, Hemoglobin A1c.

## 2017-10-13 NOTE — Telephone Encounter (Signed)
Copied from CRM [id number]. Topic: Quick Communication - See Telephone Encounter >> Oct 13, 2017  4:43 PM Arlyss Gandy, NT wrote: CRM for notification. See Telephone encounter for: Pt would like to speak with [name] regarding something she believes she misunderstood from him today. Did not provide further details.   10/13/17.

## 2017-10-14 ENCOUNTER — Encounter: Payer: Self-pay | Admitting: Family Medicine

## 2017-10-14 LAB — BASIC METABOLIC PANEL
BUN/Creatinine Ratio: 15 (ref 12–28)
BUN: 11 mg/dL (ref 8–27)
CO2: 22 mmol/L (ref 20–29)
CREATININE: 0.75 mg/dL (ref 0.57–1.00)
Calcium: 9.6 mg/dL (ref 8.7–10.3)
Chloride: 103 mmol/L (ref 96–106)
GFR calc Af Amer: 91 mL/min/{1.73_m2} (ref >59)
GFR, EST NON AFRICAN AMERICAN: 79 mL/min/{1.73_m2} (ref >59)
GLUCOSE: 156 mg/dL — AB (ref 65–99)
Potassium: 4.2 mmol/L (ref 3.5–5.2)
Sodium: 142 mmol/L (ref 134–144)

## 2017-10-14 NOTE — Telephone Encounter (Signed)
Attempted to reach pt. Did not answer.

## 2017-10-14 NOTE — Telephone Encounter (Signed)
Patient calling stating that someone called her and she will keep phone on her so she want miss anymore calls

## 2017-10-15 NOTE — Telephone Encounter (Signed)
Routing to provider  

## 2017-10-15 NOTE — Telephone Encounter (Signed)
Copied from CRM [id number]. Topic: Quick Communication - See Telephone Encounter >> Oct 15, 2017 10:26 AM Jolayne Haines L wrote: CRM for notification. See Telephone encounter for:   10/15/17.  Patient said she was in the office on Wednesday , she said that Dr Dossie Arbour gave her some eye drops for her eyes, she said that they are not working & that she seems to think its just sinus issues & wants something called in. I advised her that Dr Dossie Arbour is off today. Call back is [phone].

## 2017-10-18 MED ORDER — AMOXICILLIN-POT CLAVULANATE 875-125 MG PO TABS: 1.0000 | ORAL_TABLET | Freq: Two times a day (BID) | ORAL | 0 refills | Status: DC

## 2017-10-18 NOTE — Telephone Encounter (Signed)
Patient notified that medication was sent in for her.  

## 2017-10-27 ENCOUNTER — Encounter: Payer: Self-pay | Admitting: Family Medicine

## 2017-10-27 ENCOUNTER — Ambulatory Visit (INDEPENDENT_AMBULATORY_CARE_PROVIDER_SITE_OTHER): Payer: Medicare Other | Admitting: Family Medicine

## 2017-10-27 VITALS — BP 124/69 | HR 76 | Temp 98.3°F | Wt 225.0 lb

## 2017-10-27 DIAGNOSIS — R2231 Localized swelling, mass and lump, right upper limb: Secondary | ICD-10-CM

## 2017-10-27 NOTE — Progress Notes (Signed)
BP 124/69 (BP Location: Right Arm, Patient Position: Sitting, Cuff Size: Normal)   Pulse 76   Temp 98.3 F (36.8 C) (Oral)   Wt 225 lb (102.1 kg)   SpO2 97%   BMI 43.94 kg/m    Subjective:    Patient ID: Cynthia Jones, female    DOB: 1941-04-21, 77 y.o.   MRN: [id number]  HPI: Cynthia Jones is a 77 y.o. female  Chief Complaint  Patient presents with  . Mass    Under arm near arm pit. Patient states it's been there for a while. This last week it has became sore and tender.    Pt here today with concern for a mass she discovered in her right underarm area several months ago. Has not noticed any growth, but now having a bit of redness and tenderness over the area. Denies fevers, chills, weight loss, fatigue, breast changes, recent illness. Does have a hx of recurrent boils but states this does not feel similar to those. Has not tried anything for relief.   Relevant past medical, surgical, family and social history reviewed and updated as indicated. Interim medical history since our last visit reviewed. Allergies and medications reviewed and updated.  Review of Systems  Per HPI unless specifically indicated above     Objective:    BP 124/69 (BP Location: Right Arm, Patient Position: Sitting, Cuff Size: Normal)   Pulse 76   Temp 98.3 F (36.8 C) (Oral)   Wt 225 lb (102.1 kg)   SpO2 97%   BMI 43.94 kg/m   Wt Readings from Last 3 Encounters:  10/27/17 225 lb (102.1 kg)  10/13/17 226 lb (102.5 kg)  09/02/17 225 lb (102.1 kg)    Physical Exam  Constitutional: She is oriented to person, place, and time. She appears well-developed and well-nourished. No distress.  HENT:  Head: Atraumatic.  Eyes: Conjunctivae are normal. Pupils are equal, round, and reactive to light.  Neck: Normal range of motion. Neck supple.  Cardiovascular: Normal rate, normal heart sounds and intact distal pulses.  Pulmonary/Chest: Effort normal and breath sounds normal. No respiratory  distress.  Declines breast exam today  Musculoskeletal: Normal range of motion.  Neurological: She is alert and oriented to person, place, and time.  Skin: Skin is warm and dry.  Mild erythema and ttp over 1 cm semi-firm mass of right axilla.   Psychiatric: She has a normal mood and affect. Her behavior is normal.  Nursing note and vitals reviewed.  Results for orders placed or performed in visit on 10/13/17  Basic metabolic panel  Result Value Ref Range   Glucose 156 (H) 65 - 99 mg/dL   BUN 11 8 - 27 mg/dL   Creatinine, Ser 0.93 0.57 - 1.00 mg/dL   GFR calc non Af Amer 79 >59 mL/min/1.73   GFR calc Af Amer 91 >59 mL/min/1.73   BUN/Creatinine Ratio 15 12 - 28   Sodium 142 134 - 144 mmol/L   Potassium 4.2 3.5 - 5.2 mmol/L   Chloride 103 96 - 106 mmol/L   CO2 22 20 - 29 mmol/L   Calcium 9.6 8.7 - 10.3 mg/dL  Bayer DCA Hb G1W Waived  Result Value Ref Range   Bayer DCA Hb A1c Waived 6.7 <7.0 %  LP+ALT+AST Piccolo, Waived  Result Value Ref Range   ALT (SGPT) Piccolo, Waived 24 10 - 47 U/L   AST (SGOT) Piccolo, Waived 20 11 - 38 U/L   Cholesterol Piccolo, Waived 203 (H) <  200 mg/dL   HDL Chol Piccolo, Waived 74 >59 mg/dL   Triglycerides Piccolo,Waived 129 <150 mg/dL   Chol/HDL Ratio Piccolo,Waive 2.7 mg/dL   LDL Chol Calc Piccolo Waived 103 (H) <100 mg/dL   VLDL Chol Calc Piccolo,Waive 26 <30 mg/dL      Assessment & Plan:   Problem List Items Addressed This Visit    None    Visit Diagnoses    Axillary mass, right    -  Primary    Lymphadenopathy vs cyst - will obtain soft tissue imaging to assess structure and determine if bx is needed. Await results and follow up accordingly  Follow up plan: Return for as scheduled.

## 2017-10-28 ENCOUNTER — Other Ambulatory Visit: Payer: Self-pay

## 2017-10-28 DIAGNOSIS — R2231 Localized swelling, mass and lump, right upper limb: Secondary | ICD-10-CM

## 2017-10-29 NOTE — Patient Instructions (Signed)
Follow up as scheduled.  

## 2017-11-03 ENCOUNTER — Other Ambulatory Visit: Payer: Self-pay | Admitting: Family Medicine

## 2017-11-03 ENCOUNTER — Ambulatory Visit
Admission: RE | Admit: 2017-11-03 | Discharge: 2017-11-03 | Disposition: A | Discharge status: Home / Self Care | Payer: Medicare Other | Source: Ambulatory Visit | Attending: Family Medicine | Admitting: Family Medicine

## 2017-11-03 DIAGNOSIS — R2231 Localized swelling, mass and lump, right upper limb: Secondary | ICD-10-CM | POA: Diagnosis present

## 2017-11-03 DIAGNOSIS — N6489 Other specified disorders of breast: Secondary | ICD-10-CM | POA: Diagnosis not present

## 2017-11-03 DIAGNOSIS — R928 Other abnormal and inconclusive findings on diagnostic imaging of breast: Secondary | ICD-10-CM | POA: Insufficient documentation

## 2017-11-03 DIAGNOSIS — R921 Mammographic calcification found on diagnostic imaging of breast: Secondary | ICD-10-CM | POA: Diagnosis not present

## 2017-11-04 ENCOUNTER — Other Ambulatory Visit: Payer: Medicare Other

## 2017-11-10 ENCOUNTER — Encounter: Payer: Self-pay | Admitting: Nurse Practitioner

## 2017-11-10 ENCOUNTER — Ambulatory Visit: Payer: Medicare Other | Attending: Nurse Practitioner | Admitting: Nurse Practitioner

## 2017-11-10 ENCOUNTER — Other Ambulatory Visit: Payer: Self-pay

## 2017-11-10 VITALS — BP 147/52 | HR 73 | Temp 98.1°F | Resp 18 | Ht 60.0 in | Wt 222.0 lb

## 2017-11-10 DIAGNOSIS — E7800 Pure hypercholesterolemia, unspecified: Secondary | ICD-10-CM | POA: Insufficient documentation

## 2017-11-10 DIAGNOSIS — L304 Erythema intertrigo: Secondary | ICD-10-CM | POA: Insufficient documentation

## 2017-11-10 DIAGNOSIS — M25512 Pain in left shoulder: Secondary | ICD-10-CM | POA: Insufficient documentation

## 2017-11-10 DIAGNOSIS — M8580 Other specified disorders of bone density and structure, unspecified site: Secondary | ICD-10-CM | POA: Diagnosis not present

## 2017-11-10 DIAGNOSIS — M25562 Pain in left knee: Secondary | ICD-10-CM | POA: Diagnosis not present

## 2017-11-10 DIAGNOSIS — M25551 Pain in right hip: Secondary | ICD-10-CM | POA: Diagnosis not present

## 2017-11-10 DIAGNOSIS — M79641 Pain in right hand: Secondary | ICD-10-CM | POA: Insufficient documentation

## 2017-11-10 DIAGNOSIS — Z881 Allergy status to other antibiotic agents status: Secondary | ICD-10-CM | POA: Insufficient documentation

## 2017-11-10 DIAGNOSIS — Z79899 Other long term (current) drug therapy: Secondary | ICD-10-CM | POA: Diagnosis not present

## 2017-11-10 DIAGNOSIS — E1140 Type 2 diabetes mellitus with diabetic neuropathy, unspecified: Secondary | ICD-10-CM | POA: Insufficient documentation

## 2017-11-10 DIAGNOSIS — I10 Essential (primary) hypertension: Secondary | ICD-10-CM | POA: Diagnosis not present

## 2017-11-10 DIAGNOSIS — G4700 Insomnia, unspecified: Secondary | ICD-10-CM | POA: Insufficient documentation

## 2017-11-10 DIAGNOSIS — M797 Fibromyalgia: Secondary | ICD-10-CM | POA: Diagnosis not present

## 2017-11-10 DIAGNOSIS — M545 Low back pain: Secondary | ICD-10-CM | POA: Diagnosis not present

## 2017-11-10 DIAGNOSIS — J45909 Unspecified asthma, uncomplicated: Secondary | ICD-10-CM | POA: Insufficient documentation

## 2017-11-10 DIAGNOSIS — M5481 Occipital neuralgia: Secondary | ICD-10-CM | POA: Diagnosis not present

## 2017-11-10 DIAGNOSIS — Z794 Long term (current) use of insulin: Secondary | ICD-10-CM | POA: Insufficient documentation

## 2017-11-10 DIAGNOSIS — M25561 Pain in right knee: Secondary | ICD-10-CM | POA: Insufficient documentation

## 2017-11-10 DIAGNOSIS — M25552 Pain in left hip: Secondary | ICD-10-CM | POA: Diagnosis not present

## 2017-11-10 DIAGNOSIS — R51 Headache: Secondary | ICD-10-CM | POA: Insufficient documentation

## 2017-11-10 DIAGNOSIS — M1712 Unilateral primary osteoarthritis, left knee: Secondary | ICD-10-CM | POA: Diagnosis not present

## 2017-11-10 DIAGNOSIS — M47816 Spondylosis without myelopathy or radiculopathy, lumbar region: Secondary | ICD-10-CM | POA: Insufficient documentation

## 2017-11-10 DIAGNOSIS — F329 Major depressive disorder, single episode, unspecified: Secondary | ICD-10-CM | POA: Diagnosis not present

## 2017-11-10 DIAGNOSIS — M25511 Pain in right shoulder: Secondary | ICD-10-CM | POA: Diagnosis not present

## 2017-11-10 DIAGNOSIS — Z886 Allergy status to analgesic agent status: Secondary | ICD-10-CM | POA: Insufficient documentation

## 2017-11-10 DIAGNOSIS — G894 Chronic pain syndrome: Secondary | ICD-10-CM | POA: Insufficient documentation

## 2017-11-10 DIAGNOSIS — M79642 Pain in left hand: Secondary | ICD-10-CM | POA: Insufficient documentation

## 2017-11-10 MED ORDER — OXYCODONE HCL 5 MG PO TABS: 5.0000 mg | ORAL_TABLET | Freq: Two times a day (BID) | ORAL | 0 refills | Status: DC

## 2017-11-10 MED ORDER — CYCLOBENZAPRINE HCL 5 MG PO TABS: 5.0000 mg | ORAL_TABLET | Freq: Every day | ORAL | 0 refills | Status: AC

## 2017-11-10 NOTE — Progress Notes (Signed)
Patient's Name: Cynthia Jones  MRN: [id number]  Referring Provider: Guadalupe Maple, MD  DOB: [birth year]  PCP: Cynthia Maple, MD  DOS: 11/10/2017  Note by: Cynthia Francois NP  Service setting: Ambulatory outpatient  Specialty: Interventional Pain Management  Location: [facility] (AMB) Pain Management Facility    Patient type: Established    Primary Reason(s) for Visit: Encounter for prescription drug management. (Level of risk: moderate)  CC: Hand Pain (bilateral); Shoulder Pain (bilateral); and Back Pain (low)  HPI  [name] is a 77 y.o. year old, female patient, who comes today for a medication management evaluation. She has Osteopenia; Depression; Essential hypertension; Diabetes mellitus ([place]); Osteoarthritis; Seasonal allergic rhinitis; Obesity; Intertrigo; Fibromyalgia; Bilateral occipital neuralgia; Greater trochanteric bursitis; Osteoarthritis of knee (Location of Primary Source of Pain) (Left); Sacroiliac joint disease; Lumbar facet syndrome (Bilateral) (L>R); Neuropathy due to secondary diabetes ([place]); S/P TKR Arthroplasty (Right); Folliculitis; Insomnia; Long term prescription opiate use; Long term prescription benzodiazepine use; Chronic pain syndrome; Chronic low back pain (Location of Secondary source of pain) (Bilateral) (L>R); Chronic knee pain (Location of Primary Source of Pain) (Bilateral) (L>R); Cervicogenic headache; Chronic hip pain (Bilateral); Chronic hand pain (Bilateral) (R>L); Chronic foot pain (Left); Hypercholesteremia; Lumbar spondylosis; and Advanced care planning/counseling discussion on their problem list. Her primarily concern today is the Hand Pain (bilateral); Shoulder Pain (bilateral); and Back Pain (low)  Pain Assessment: Location: Left, Right Shoulder Radiating: hands Onset: More than a month ago Duration: Chronic pain Quality: Aching, Constant Severity: 6 /10 (self-reported pain score)  Note: Reported level is compatible with observation. Clinically  the patient looks like a 2/10 A 2/10 is viewed as "Mild to Moderate" and described as noticeable and distracting. Impossible to hide from other people. More frequent flare-ups. Still possible to adapt and function close to normal. It can be very annoying and may have occasional stronger flare-ups. With discipline, patients may get used to it and adapt. Information on the proper use of the pain scale provided to the patient today. When using our objective Pain Scale, levels between 6 and 10/10 are said to belong in an emergency room, as it progressively worsens from a 6/10, described as severely limiting, requiring emergency care not usually available at an outpatient pain management facility. At a 6/10 level, communication becomes difficult and requires great effort. Assistance to reach the emergency department may be required. Facial flushing and profuse sweating along with potentially dangerous increases in heart rate and blood pressure will be evident. Timing: Constant Modifying factors: denies  [name] was last scheduled for an appointment on 08/16/2017 for medication management. During today's appointment we reviewed [name]'s chronic pain status, as well as her outpatient medication regimen.  The patient  reports that she does not use drugs. Her body mass index is 43.36 kg/m.  Further details on both, my assessment(s), as well as the proposed treatment plan, please see below.  Controlled Substance Pharmacotherapy Assessment REMS (Risk Evaluation and Mitigation Strategy)  Analgesic:Oxycodone IR 5 mg 1 tablet by mouth twice a day MME/day:'15mg'$ /day .  Hart Rochester, RN  11/10/2017  1:56 PM  Sign at close encounter Nursing Pain Medication Assessment:  Safety precautions to be maintained throughout the outpatient stay will include: orient to surroundings, keep bed in low position, maintain call bell within reach at all times, provide assistance with transfer out of bed and  ambulation.  Medication Inspection Compliance: Pill count conducted under aseptic conditions, in front of the patient. Neither the pills nor  the bottle was removed from the patient's sight at any time. Once count was completed pills were immediately returned to the patient in their original bottle.  Medication: Oxycodone IR Pill/Patch Count: 32 of 60 pills remain Pill/Patch Appearance: Markings consistent with prescribed medication Bottle Appearance: Standard pharmacy container. Clearly labeled. Filled Date: 02 / 23 / 2019 Last Medication intake:  Yesterday   Pharmacokinetics: Liberation and absorption (onset of action): WNL Distribution (time to peak effect): WNL Metabolism and excretion (duration of action): WNL         Pharmacodynamics: Desired effects: Analgesia: [name] reports >50% benefit. Functional ability: Patient reports that medication allows her to accomplish basic ADLs Clinically meaningful improvement in function (CMIF): Sustained CMIF goals met Perceived effectiveness: Described as relatively effective, allowing for increase in activities of daily living (ADL) Undesirable effects: Side-effects or Adverse reactions: None reported Monitoring: [place] PMP: Online review of the past 4-monthperiod conducted. Compliant with practice rules and regulations Last UDS on record: Summary  Date Value Ref Range Status  02/10/2017 FINAL  Final    Comment:    ==================================================================== TOXASSURE SELECT 13 (MW) ==================================================================== Test                             Result       Flag       Units Drug Present and Declared for Prescription Verification   Oxycodone                      141          EXPECTED   ng/mg creat   Noroxycodone                   938          EXPECTED   ng/mg creat    Sources of oxycodone include scheduled prescription medications.    Noroxycodone is an expected  metabolite of oxycodone. Drug Absent but Declared for Prescription Verification   Alprazolam                     Not Detected UNEXPECTED ng/mg creat ==================================================================== Test                      Result    Flag   Units      Ref Range   Creatinine              146              mg/dL      >=20 ==================================================================== Declared Medications:  The flagging and interpretation on this report are based on the  following declared medications.  Unexpected results may arise from  inaccuracies in the declared medications.  **Note: The testing scope of this panel includes these medications:  Alprazolam  Oxycodone  **Note: The testing scope of this panel does not include following  reported medications:  Carvedilol  Chlorhexidine  Duloxetine  Esomeprazole  Losartan (Losartan Potassium)  Nystatin ==================================================================== For clinical consultation, please call [phone]====    UDS interpretation: Compliant          Medication Assessment Form: Reviewed. Patient indicates being compliant with therapy Treatment compliance: Compliant Risk Assessment Profile: Aberrant behavior: See prior evaluations. None observed or detected today Comorbid factors increasing risk of overdose: See prior notes. No additional risks detected today Risk of substance use disorder (SUD): Low  ORT Scoring  interpretation table:  Score <3 = Low Risk for SUD  Score between 4-7 = Moderate Risk for SUD  Score >8 = High Risk for Opioid Abuse   Risk Mitigation Strategies:  Patient Counseling: Covered Patient-Prescriber Agreement (PPA): Present and active  Notification to other healthcare providers: Done  Pharmacologic Plan: No change in therapy, at this time.             Laboratory Chemistry  Inflammation  Markers (CRP: Acute Phase) (ESR: Chronic Phase) Lab Results  Component Value Date   CRP 1.1 (H) 09/24/2016   ESRSEDRATE 44 (H) 09/24/2016                         Rheumatology Markers No results found for: Elayne Guerin, [facility]              Renal Function Markers Lab Results  Component Value Date   BUN 11 10/13/2017   CREATININE 0.75 10/13/2017   GFRAA 91 10/13/2017   GFRNONAA 79 10/13/2017                 Hepatic Function Markers Lab Results  Component Value Date   AST 20 10/13/2017   ALT 24 10/13/2017   ALBUMIN 4.1 04/12/2017   ALKPHOS 49 04/12/2017                 Electrolytes Lab Results  Component Value Date   NA 142 10/13/2017   K 4.2 10/13/2017   CL 103 10/13/2017   CALCIUM 9.6 10/13/2017   MG 2.1 09/24/2016                        Neuropathy Markers Lab Results  Component Value Date   VITAMINB12 344 09/24/2016   HGBA1C 6.6 04/08/2016                 Bone Pathology Markers Lab Results  Component Value Date   25OHVITD1 20 (L) 09/24/2016   25OHVITD2 <1.0 09/24/2016   25OHVITD3 19 09/24/2016                         Coagulation Parameters Lab Results  Component Value Date   PLT 328 04/12/2017                 Cardiovascular Markers Lab Results  Component Value Date   HGB 13.2 04/12/2017   HCT 39.9 04/12/2017                 CA Markers No results found for: CEA, CA125, LABCA2               Note: Lab results reviewed.  Recent Diagnostic Imaging Results  [place] AXILLA RIGHT CLINICAL DATA:  77 year old presenting with a possible palpable lump in the right axilla, intermittently tender. Annual mammographic evaluation.  EXAM: DIGITAL DIAGNOSTIC BILATERAL MAMMOGRAM WITH CAD AND TOMO  ULTRASOUND RIGHT AXILLA  COMPARISON:  Mammography 03/26/2014, 03/01/2013 and earlier. No prior right axillary ultrasound.  ACR Breast Density Category b: There are scattered areas of fibroglandular density.  FINDINGS: Standard 2D  and tomosynthesis full field CC and MLO views of both breasts were obtained.  No findings suspicious for malignancy in either breast.  Benign dystrophic calcifications associated with a degenerating fibroadenoma in the lower left breast at middle to posterior depth, unchanged dating back to 2010.  Mammographic images were processed with CAD.  On physical exam, there  is no palpable mass or lymphadenopathy in the right axilla in the area of patient concern.  Targeted right axillary ultrasound is performed, showing normal fibrofatty tissue in the area of palpable concern. A normal-appearing level 2 lymph node is present behind the pectoralis muscle deep in the area of palpable concern measuring approximate 1.2 cm. No mass or pathologic lymphadenopathy is identified.  IMPRESSION: 1. No mammographic evidence of malignancy involving either breast. 2. No pathologic right axillary lymphadenopathy.  RECOMMENDATION: Screening mammogram in one year.(Code:SM-B-01Y)  I have discussed the findings and recommendations with the patient. Results were also provided in writing at the conclusion of the visit. If applicable, a reminder letter will be sent to the patient regarding the next appointment.  BI-RADS CATEGORY  2: Benign.  Electronically Signed   By: Evangeline Dakin M.D.   On: 11/03/2017 12:04 MM DIAG BREAST TOMO BILATERAL CLINICAL DATA:  77 year old presenting with a possible palpable lump in the right axilla, intermittently tender. Annual mammographic evaluation.  EXAM: DIGITAL DIAGNOSTIC BILATERAL MAMMOGRAM WITH CAD AND TOMO  ULTRASOUND RIGHT AXILLA  COMPARISON:  Mammography 03/26/2014, 03/01/2013 and earlier. No prior right axillary ultrasound.  ACR Breast Density Category b: There are scattered areas of fibroglandular density.  FINDINGS: Standard 2D and tomosynthesis full field CC and MLO views of both breasts were obtained.  No findings suspicious for malignancy in  either breast.  Benign dystrophic calcifications associated with a degenerating fibroadenoma in the lower left breast at middle to posterior depth, unchanged dating back to 2010.  Mammographic images were processed with CAD.  On physical exam, there is no palpable mass or lymphadenopathy in the right axilla in the area of patient concern.  Targeted right axillary ultrasound is performed, showing normal fibrofatty tissue in the area of palpable concern. A normal-appearing level 2 lymph node is present behind the pectoralis muscle deep in the area of palpable concern measuring approximate 1.2 cm. No mass or pathologic lymphadenopathy is identified.  IMPRESSION: 1. No mammographic evidence of malignancy involving either breast. 2. No pathologic right axillary lymphadenopathy.  RECOMMENDATION: Screening mammogram in one year.(Code:SM-B-01Y)  I have discussed the findings and recommendations with the patient. Results were also provided in writing at the conclusion of the visit. If applicable, a reminder letter will be sent to the patient regarding the next appointment.  BI-RADS CATEGORY  2: Benign.  Electronically Signed   By: Evangeline Dakin M.D.   On: 11/03/2017 12:04  Complexity Note: Imaging results reviewed. Results shared with Ms. Hodsdon, using Layman's terms.                         Meds   Current Outpatient Medications:  .  ALPRAZolam (XANAX) 0.25 MG tablet, Take 1 tablet (0.25 mg total) by mouth 2 (two) times daily as needed for anxiety., Disp: 60 tablet, Rfl: 4 .  carvedilol (COREG) 12.5 MG tablet, Take 1 tablet (12.5 mg total) by mouth 2 (two) times daily with a meal., Disp: 180 tablet, Rfl: 4 .  DULoxetine (CYMBALTA) 60 MG capsule, Take 1 capsule (60 mg total) by mouth daily., Disp: 90 capsule, Rfl: 4 .  esomeprazole (NEXIUM) 40 MG capsule, Take 40 mg by mouth daily at 12 noon., Disp: , Rfl:  .  glucose blood (IGLUCOSE TEST STRIPS) test strip, Use as instructed  - freestyle insulin x strip, Disp: 100 each, Rfl: 12 .  losartan (COZAAR) 100 MG tablet, TAKE 1 TABLET (100 MG TOTAL) BY MOUTH DAILY., Disp:  90 tablet, Rfl: 2 .  mupirocin ointment (BACTROBAN) 2 %, Place 1 application into the nose 2 (two) times daily., Disp: 22 g, Rfl: 0 .  nystatin (NYSTATIN) powder, Apply topically 4 (four) times daily., Disp: 45 g, Rfl: 4 .  [START ON 12/21/2017] oxyCODONE (OXY IR/ROXICODONE) 5 MG immediate release tablet, Take 1 tablet (5 mg total) by mouth 2 (two) times daily., Disp: 60 tablet, Rfl: 0 .  trimethoprim-polymyxin b (POLYTRIM) ophthalmic solution, Place 1 drop into both eyes every 4 (four) hours., Disp: 10 mL, Rfl: 0 .  cyclobenzaprine (FLEXERIL) 5 MG tablet, Take 1 tablet (5 mg total) by mouth at bedtime., Disp: 90 tablet, Rfl: 0 .  [START ON 01/20/2018] oxyCODONE (OXY IR/ROXICODONE) 5 MG immediate release tablet, Take 1 tablet (5 mg total) by mouth 2 (two) times daily., Disp: 60 tablet, Rfl: 0 .  [START ON 11/21/2017] oxyCODONE (OXY IR/ROXICODONE) 5 MG immediate release tablet, Take 1 tablet (5 mg total) by mouth 2 (two) times daily., Disp: 60 tablet, Rfl: 0  ROS  Constitutional: Denies any fever or chills Gastrointestinal: No reported hemesis, hematochezia, vomiting, or acute GI distress Musculoskeletal: Denies any acute onset joint swelling, redness, loss of ROM, or weakness Neurological: No reported episodes of acute onset apraxia, aphasia, dysarthria, agnosia, amnesia, paralysis, loss of coordination, or loss of consciousness  Allergies  Ms. Pyeatt is allergic to nsaids and erythromycin.  [place]  Drug: Ms. Harlacher  reports that she does not use drugs. Alcohol:  reports that she does not drink alcohol. Tobacco:  reports that  has never smoked. she has never used smokeless tobacco. Medical:  has a past medical history of Acute anxiety (03/06/2015), Allergy, Anxiety, Degenerative joint disease of knee, Depression, Fibromyalgia, Obesity, Osteopenia, and RAD  (reactive airway disease). Surgical: Ms. Enlow  has a past surgical history that includes Parotidectomy (Left); Appendectomy; Cholecystectomy; Abdominal hysterectomy; Tonsillectomy; Joint replacement (2014); Eye surgery; and Breast cyst aspiration (Right, 04/18/2013). Family: family history includes Alzheimer's disease in her father; Cancer in her sister; Diabetes in her mother; Heart disease in her mother; Parkinson's disease in her father; Stroke in her mother.  Constitutional Exam  General appearance: Well nourished, well developed, and well hydrated. In no apparent acute distress Vitals:   11/10/17 1356  BP: (!) 147/52  Pulse: 73  Resp: 18  Temp: 98.1 F (36.7 C)  TempSrc: Oral  SpO2: 95%  Weight: 222 lb (100.7 kg)  Height: 5' (1.524 m)  Psych/Mental status: Alert, oriented x 3 (person, place, & time)       Eyes: PERLA Respiratory: No evidence of acute respiratory distress  Cervical Spine Area Exam  Skin & Axial Inspection: No masses, redness, edema, swelling, or associated skin lesions Alignment: Symmetrical Functional ROM: Unrestricted ROM      Stability: No instability detected Muscle Tone/Strength: Functionally intact. No obvious neuro-muscular anomalies detected. Sensory (Neurological): Unimpaired Palpation: No palpable anomalies              Upper Extremity (UE) Exam    Side: Right upper extremity  Side: Left upper extremity  Skin & Extremity Inspection: Skin color, temperature, and hair growth are WNL. No peripheral edema or cyanosis. No masses, redness, swelling, asymmetry, or associated skin lesions. No contractures.  Skin & Extremity Inspection: Skin color, temperature, and hair growth are WNL. No peripheral edema or cyanosis. No masses, redness, swelling, asymmetry, or associated skin lesions. No contractures.  Functional ROM: Unrestricted ROM          Functional ROM: Unrestricted  ROM          Muscle Tone/Strength: Functionally intact. No obvious neuro-muscular  anomalies detected.  Muscle Tone/Strength: Functionally intact. No obvious neuro-muscular anomalies detected.  Sensory (Neurological): Unimpaired          Sensory (Neurological): Unimpaired          Palpation: No palpable anomalies              Palpation: No palpable anomalies              Specialized Test(s): Deferred         Specialized Test(s): Deferred          Thoracic Spine Area Exam  Skin & Axial Inspection: No masses, redness, or swelling Alignment: Symmetrical Functional ROM: Unrestricted ROM Stability: No instability detected Muscle Tone/Strength: Functionally intact. No obvious neuro-muscular anomalies detected. Sensory (Neurological): Unimpaired Muscle strength & Tone: No palpable anomalies  Lumbar Spine Area Exam  Skin & Axial Inspection: No masses, redness, or swelling Alignment: Symmetrical Functional ROM: Unrestricted ROM      Stability: No instability detected Muscle Tone/Strength: Functionally intact. No obvious neuro-muscular anomalies detected. Sensory (Neurological): Unimpaired Palpation: Complains of area being tender to palpation       Provocative Tests: Lumbar Hyperextension and rotation test: Positive bilaterally for facet joint pain.        Lumbar Lateral bending test: evaluation deferred today       Patrick's Maneuver: evaluation deferred today                    Gait & Posture Assessment  Ambulation: Unassisted Gait: Relatively normal for age and body habitus Posture: WNL   Lower Extremity Exam    Side: Right lower extremity  Side: Left lower extremity  Skin & Extremity Inspection: Skin color, temperature, and hair growth are WNL. No peripheral edema or cyanosis. No masses, redness, swelling, asymmetry, or associated skin lesions. No contractures.  Skin & Extremity Inspection: Skin color, temperature, and hair growth are WNL. No peripheral edema or cyanosis. No masses, redness, swelling, asymmetry, or associated skin lesions. No contractures.  Functional  ROM: Unrestricted ROM          Functional ROM: Unrestricted ROM          Muscle Tone/Strength: Functionally intact. No obvious neuro-muscular anomalies detected.  Muscle Tone/Strength: Functionally intact. No obvious neuro-muscular anomalies detected.  Sensory (Neurological): Unimpaired  Sensory (Neurological): Unimpaired  Palpation: No palpable anomalies  Palpation: No palpable anomalies   Assessment  Primary Diagnosis & Pertinent Problem List: The primary encounter diagnosis was Lumbar spondylosis. Diagnoses of Lumbar facet syndrome (Bilateral) (L>R), Osteoarthritis of knee (Location of Primary Source of Pain) (Left), Fibromyalgia, and Chronic pain syndrome were also pertinent to this visit.  Status Diagnosis  Controlled Controlled Controlled 1. Lumbar spondylosis   2. Lumbar facet syndrome (Bilateral) (L>R)   3. Osteoarthritis of knee (Location of Primary Source of Pain) (Left)   4. Fibromyalgia   5. Chronic pain syndrome     Problems updated and reviewed during this visit: No problems updated. Plan of Care  Pharmacotherapy (Medications Ordered): Meds ordered this encounter  Medications  . oxyCODONE (OXY IR/ROXICODONE) 5 MG immediate release tablet    Sig: Take 1 tablet (5 mg total) by mouth 2 (two) times daily.    Dispense:  60 tablet    Refill:  0    Fill one day early if pharmacy is closed on scheduled refill date. Do not fill until: 01/20/2018 To last until:02/19/2018  Order Specific Question:   Supervising Provider    Answer:   Milinda Pointer [phone]  . oxyCODONE (OXY IR/ROXICODONE) 5 MG immediate release tablet    Sig: Take 1 tablet (5 mg total) by mouth 2 (two) times daily.    Dispense:  60 tablet    Refill:  0    Fill one day early if pharmacy is closed on scheduled refill date. Do not fill until: 12/21/2017 To last until: 01/20/2018    Order Specific Question:   Supervising Provider    Answer:   Milinda Pointer [phone]  . oxyCODONE (OXY IR/ROXICODONE) 5 MG  immediate release tablet    Sig: Take 1 tablet (5 mg total) by mouth 2 (two) times daily.    Dispense:  60 tablet    Refill:  0    Fill one day early if pharmacy is closed on scheduled refill date. Do not fill until: 11/21/2017 To last until:12/21/2017    Order Specific Question:   Supervising Provider    Answer:   Milinda Pointer [phone]  . cyclobenzaprine (FLEXERIL) 5 MG tablet    Sig: Take 1 tablet (5 mg total) by mouth at bedtime.    Dispense:  90 tablet    Refill:  0    Do not place this medication, or any other prescription from our practice, on "Automatic Refill". Patient may have prescription filled one day early if pharmacy is closed on scheduled refill date.    Order Specific Question:   Supervising Provider    Answer:   Milinda Pointer [phone]   New Prescriptions   CYCLOBENZAPRINE (FLEXERIL) 5 MG TABLET    Take 1 tablet (5 mg total) by mouth at bedtime.   Medications administered today: Grayling Congress had no medications administered during this visit. Lab-work, procedure(s), and/or referral(s): No orders of the defined types were placed in this encounter.  Imaging and/or referral(s): None  Interventional therapies: Planned, scheduled, and/or pending:  Not at this time.   Considering:  Diagnostic right Genicular nerve block Possible right Genicular nerve radiofrequencyablation  Diagnosticleft intra-articular knee joint injection Possible series of 5 left intra-articular Hyalgan knee injections Diagnostic left Genicular nerve block Possibleleft Genicular nerve RFA Diagnostic bilateral lumbar facet block Possible bilateral lumbar facet RFA Diagnostic left lumbar epidural steroid injection Diagnostic caudal epidural steroid injection + epidurogram Possible Racz procedure Diagnostic bilateral cervical facet block Possible bilateral cervical facet RFA Diagnostic right versus left cervical epidural steroid injection Diagnostic bilateral  intra-articular shoulder joint injection Diagnostic bilateral suprascapular nerve block Possible bilateral suprascapular nerve RFA   Palliative PRN treatment(s):  PalliativebilateralGenicular nerve block Palliativeleft intra-articular knee joint injection Palliativebilateral lumbar facet block  Palliativeleft lumbar epidural steroid injection  Palliativecaudal epiduralsteroid injection + epidurogram  Palliativebilateral cervical facet block Palliativeright versus left cervical epidural steroid injection  Palliativebilateral intra-articular shoulderjoint injection  Palliativebilateral suprascapularnerve block    Provider-requested follow-up: Return in about 3 months (around 02/10/2018) for MedMgmt with Me Donella Stade Edison Pace).  Future Appointments  Date Time Provider [place]  02/09/2018 12:45 PM Cynthia Francois, NP [facility] None  04/14/2018  9:00 AM Jeananne Rama Jeannette How, MD [facility]   Primary Care Physician: Cynthia Maple, MD Location: [facility] Note by: Cynthia Francois NP Date: 11/10/2017; Time: 10:43 AM  Pain Score Disclaimer: We use the NRS-11 scale. This is a self-reported, subjective measurement of pain severity with only modest accuracy. It is used primarily to identify changes within a particular patient. It must be understood that  outpatient pain scales are significantly less accurate that those used for research, where they can be applied under ideal controlled circumstances with minimal exposure to variables. In reality, the score is likely to be a combination of pain intensity and pain affect, where pain affect describes the degree of emotional arousal or changes in action readiness caused by the sensory experience of pain. Factors such as social and work situation, setting, emotional state, anxiety levels, expectation, and prior pain experience may influence pain perception and show large inter-individual differences that  may also be affected by time variables.  Patient instructions provided during this appointment: Patient Instructions   ____________________________________________________________________________________________  Medication Rules  Applies to: All patients receiving prescriptions (written or electronic).  Pharmacy of record: Pharmacy where electronic prescriptions will be sent. If written prescriptions are taken to a different pharmacy, please inform the nursing staff. The pharmacy listed in the electronic medical record should be the one where you would like electronic prescriptions to be sent.  Prescription refills: Only during scheduled appointments. Applies to both, written and electronic prescriptions.  NOTE: The following applies primarily to controlled substances (Opioid* Pain Medications).   Patient's responsibilities: 1. Pain Pills: Bring all pain pills to every appointment (except for procedure appointments). 2. Pill Bottles: Bring pills in original pharmacy bottle. Always bring newest bottle. Bring bottle, even if empty. 3. Medication refills: You are responsible for knowing and keeping track of what medications you need refilled. The day before your appointment, write a list of all prescriptions that need to be refilled. Bring that list to your appointment and give it to the admitting nurse. Prescriptions will be written only during appointments. If you forget a medication, it will not be "Called in", "Faxed", or "electronically sent". You will need to get another appointment to get these prescribed. 4. Prescription Accuracy: You are responsible for carefully inspecting your prescriptions before leaving our office. Have the discharge nurse carefully go over each prescription with you, before taking them home. Make sure that your name is accurately spelled, that your address is correct. Check the name and dose of your medication to make sure it is accurate. Check the number of pills,  and the written instructions to make sure they are clear and accurate. Make sure that you are given enough medication to last until your next medication refill appointment. 5. Taking Medication: Take medication as prescribed. Never take more pills than instructed. Never take medication more frequently than prescribed. Taking less pills or less frequently is permitted and encouraged, when it comes to controlled substances (written prescriptions).  6. Inform other Doctors: Always inform, all of your healthcare providers, of all the medications you take. 7. Pain Medication from other Providers: You are not allowed to accept any additional pain medication from any other Doctor or Healthcare provider. There are two exceptions to this rule. (see below) In the event that you require additional pain medication, you are responsible for notifying us, as stated below. 8. Medication Agreement: You are responsible for carefully reading and following our Medication Agreement. This must be signed before receiving any prescriptions from our practice. Safely store a copy of your signed Agreement. Violations to the Agreement will result in no further prescriptions. (Additional copies of our Medication Agreement are available upon request.) 9. Laws, Rules, & Regulations: All patients are expected to follow all Federal and [employer], [employer], Rules, [employer]. Ignorance of the Laws does not constitute a valid excuse. The use of any illegal substances is prohibited. 10. Adopted  CDC guidelines & recommendations: Target dosing levels will be at or below 60 MME/day. Use of benzodiazepines** is not recommended.  Exceptions: There are only two exceptions to the rule of not receiving pain medications from other Healthcare Providers. 1. Exception #1 (Emergencies): In the event of an emergency (i.e.: accident requiring emergency care), you are allowed to receive additional pain medication. However, you are responsible for: As  soon as you are able, call our office [phone], at any time of the day or night, and leave a message stating your name, the date and nature of the emergency, and the name and dose of the medication prescribed. In the event that your call is answered by a member of our staff, make sure to document and save the date, time, and the name of the person that took your information.  2. Exception #2 (Planned Surgery): In the event that you are scheduled by another doctor or dentist to have any type of surgery or procedure, you are allowed (for a period no longer than 30 days), to receive additional pain medication, for the acute post-op pain. However, in this case, you are responsible for picking up a copy of our "Post-op Pain Management for Surgeons" handout, and giving it to your surgeon or dentist. This document is available at our office, and does not require an appointment to obtain it. Simply go to our office during business hours (Monday-Thursday from 8:00 AM to 4:00 PM) (Friday 8:00 AM to 12:00 Noon) or if you have a scheduled appointment with [place], prior to your surgery, and ask for it by name. In addition, you will need to provide [place] with your name, name of your surgeon, type of surgery, and date of procedure or surgery.  *Opioid medications include: morphine, codeine, oxycodone, oxymorphone, hydrocodone, hydromorphone, meperidine, tramadol, tapentadol, buprenorphine, fentanyl, methadone. **Benzodiazepine medications include: diazepam (Valium), alprazolam (Xanax), clonazepam (Klonopine), lorazepam (Ativan), clorazepate (Tranxene), chlordiazepoxide (Librium), estazolam (Prosom), oxazepam (Serax), temazepam (Restoril), triazolam (Halcion) (Last updated: 10/28/2017) ____________________________________________________________________________________________   ____________________________________________________________________________________________  Pain Scale  Introduction: The pain score used by  this practice is the Verbal Numerical Rating Scale (VNRS-11). This is an 11-point scale. It is for adults and children 10 years or older. There are significant differences in how the pain score is reported, used, and applied. Forget everything you learned in the past and learn this scoring system.  General Information: The scale should reflect your current level of pain. Unless you are specifically asked for the level of your worst pain, or your average pain. If you are asked for one of these two, then it should be understood that it is over the past 24 hours.  Basic Activities of Daily Living (ADL): Personal hygiene, dressing, eating, transferring, and using restroom.  Instructions: Most patients tend to report their level of pain as a combination of two factors, their physical pain and their psychosocial pain. This last one is also known as "suffering" and it is reflection of how physical pain affects you socially and psychologically. From now on, report them separately. From this point on, when asked to report your pain level, report only your physical pain. Use the following table for reference.  Pain Clinic Pain Levels (0-5/10)  Pain Level Score  Description  No Pain 0   Mild pain 1 Nagging, annoying, but does not interfere with basic activities of daily living (ADL). Patients are able to eat, bathe, get dressed, toileting (being able to get on and off the toilet and perform personal hygiene functions),  transfer (move in and out of bed or a chair without assistance), and maintain continence (able to control bladder and bowel functions). Blood pressure and heart rate are unaffected. A normal heart rate for a healthy adult ranges from 60 to 100 bpm (beats per minute).   Mild to moderate pain 2 Noticeable and distracting. Impossible to hide from other people. More frequent flare-ups. Still possible to adapt and function close to normal. It can be very annoying and may have occasional stronger  flare-ups. With discipline, patients may get used to it and adapt.   Moderate pain 3 Interferes significantly with activities of daily living (ADL). It becomes difficult to feed, bathe, get dressed, get on and off the toilet or to perform personal hygiene functions. Difficult to get in and out of bed or a chair without assistance. Very distracting. With effort, it can be ignored when deeply involved in activities.   Moderately severe pain 4 Impossible to ignore for more than a few minutes. With effort, patients may still be able to manage work or participate in some social activities. Very difficult to concentrate. Signs of autonomic nervous system discharge are evident: dilated pupils (mydriasis); mild sweating (diaphoresis); sleep interference. Heart rate becomes elevated (>115 bpm). Diastolic blood pressure (lower number) rises above 100 mmHg. Patients find relief in laying down and not moving.   Severe pain 5 Intense and extremely unpleasant. Associated with frowning face and frequent crying. Pain overwhelms the senses.  Ability to do any activity or maintain social relationships becomes significantly limited. Conversation becomes difficult. Pacing back and forth is common, as getting into a comfortable position is nearly impossible. Pain wakes you up from deep sleep. Physical signs will be obvious: pupillary dilation; increased sweating; goosebumps; brisk reflexes; cold, clammy hands and feet; nausea, vomiting or dry heaves; loss of appetite; significant sleep disturbance with inability to fall asleep or to remain asleep. When persistent, significant weight loss is observed due to the complete loss of appetite and sleep deprivation.  Blood pressure and heart rate becomes significantly elevated. Caution: If elevated blood pressure triggers a pounding headache associated with blurred vision, then the patient should immediately seek attention at an urgent or emergency care unit, as these may be signs of an  impending stroke.    Emergency Department Pain Levels (6-10/10)  Emergency Room Pain 6 Severely limiting. Requires emergency care and should not be seen or managed at an outpatient pain management facility. Communication becomes difficult and requires great effort. Assistance to reach the emergency department may be required. Facial flushing and profuse sweating along with potentially dangerous increases in heart rate and blood pressure will be evident.   Distressing pain 7 Self-care is very difficult. Assistance is required to transport, or use restroom. Assistance to reach the emergency department will be required. Tasks requiring coordination, such as bathing and getting dressed become very difficult.   Disabling pain 8 Self-care is no longer possible. At this level, pain is disabling. The individual is unable to do even the most "basic" activities such as walking, eating, bathing, dressing, transferring to a bed, or toileting. Fine motor skills are lost. It is difficult to think clearly.   Incapacitating pain 9 Pain becomes incapacitating. Thought processing is no longer possible. Difficult to remember your own name. Control of movement and coordination are lost.   The worst pain imaginable 10 At this level, most patients pass out from pain. When this level is reached, collapse of the autonomic nervous system occurs, leading to a  sudden drop in blood pressure and heart rate. This in turn results in a temporary and dramatic drop in blood flow to the brain, leading to a loss of consciousness. Fainting is one of the body's self defense mechanisms. Passing out puts the brain in a calmed state and causes it to shut down for a while, in order to begin the healing process.    Summary: 1. Refer to this scale when providing [place] with your pain level. 2. Be accurate and careful when reporting your pain level. This will help with your care. 3. Over-reporting your pain level will lead to loss of  credibility. 4. Even a level of 1/10 means that there is pain and will be treated at our facility. 5. High, inaccurate reporting will be documented as "Symptom Exaggeration", leading to loss of credibility and suspicions of possible secondary gains such as obtaining more narcotics, or wanting to appear disabled, for fraudulent reasons. 6. Only pain levels of 5 or below will be seen at our facility. 7. Pain levels of 6 and above will be sent to the Emergency Department and the appointment cancelled. ____________________________________________________________________________________________    BMI Assessment: Estimated body mass index is 43.36 kg/m as calculated from the following:   Height as of this encounter: 5' (1.524 m).   Weight as of this encounter: 222 lb (100.7 kg).  BMI interpretation table: BMI level Category Range association with higher incidence of chronic pain  <18 kg/m2 Underweight   18.5-24.9 kg/m2 Ideal body weight   25-29.9 kg/m2 Overweight Increased incidence by 20%  30-34.9 kg/m2 Obese (Class I) Increased incidence by 68%  35-39.9 kg/m2 Severe obesity (Class II) Increased incidence by 136%  >40 kg/m2 Extreme obesity (Class III) Increased incidence by 254%   BMI Readings from Last 4 Encounters:  11/10/17 43.36 kg/m  10/27/17 43.94 kg/m  10/13/17 44.14 kg/m  09/02/17 43.94 kg/m   Wt Readings from Last 4 Encounters:  11/10/17 222 lb (100.7 kg)  10/27/17 225 lb (102.1 kg)  10/13/17 226 lb (102.5 kg)  09/02/17 225 lb (102.1 kg)

## 2017-11-10 NOTE — Patient Instructions (Addendum)
____________________________________________________________________________________________  Medication Rules  Applies to: All patients receiving prescriptions (written or electronic).  Pharmacy of record: Pharmacy where electronic prescriptions will be sent. If written prescriptions are taken to a different pharmacy, please inform the nursing staff. The pharmacy listed in the electronic medical record should be the one where you would like electronic prescriptions to be sent.  Prescription refills: Only during scheduled appointments. Applies to both, written and electronic prescriptions.  NOTE: The following applies primarily to controlled substances (Opioid* Pain Medications).   Patient's responsibilities: 1. Pain Pills: Bring all pain pills to every appointment (except for procedure appointments). 2. Pill Bottles: Bring pills in original pharmacy bottle. Always bring newest bottle. Bring bottle, even if empty. 3. Medication refills: You are responsible for knowing and keeping track of what medications you need refilled. The day before your appointment, write a list of all prescriptions that need to be refilled. Bring that list to your appointment and give it to the admitting nurse. Prescriptions will be written only during appointments. If you forget a medication, it will not be "Called in", "Faxed", or "electronically sent". You will need to get another appointment to get these prescribed. 4. Prescription Accuracy: You are responsible for carefully inspecting your prescriptions before leaving our office. Have the discharge nurse carefully go over each prescription with you, before taking them home. Make sure that your name is accurately spelled, that your address is correct. Check the name and dose of your medication to make sure it is accurate. Check the number of pills, and the written instructions to make sure they are clear and accurate. Make sure that you are given enough medication to last  until your next medication refill appointment. 5. Taking Medication: Take medication as prescribed. Never take more pills than instructed. Never take medication more frequently than prescribed. Taking less pills or less frequently is permitted and encouraged, when it comes to controlled substances (written prescriptions).  6. Inform other Doctors: Always inform, all of your healthcare providers, of all the medications you take. 7. Pain Medication from other Providers: You are not allowed to accept any additional pain medication from any other Doctor or Healthcare provider. There are two exceptions to this rule. (see below) In the event that you require additional pain medication, you are responsible for notifying us, as stated below. 8. Medication Agreement: You are responsible for carefully reading and following our Medication Agreement. This must be signed before receiving any prescriptions from our practice. Safely store a copy of your signed Agreement. Violations to the Agreement will result in no further prescriptions. (Additional copies of our Medication Agreement are available upon request.) 9. Laws, Rules, & Regulations: All patients are expected to follow all Federal and State Laws, Statutes, Rules, & Regulations. Ignorance of the Laws does not constitute a valid excuse. The use of any illegal substances is prohibited. 10. Adopted CDC guidelines & recommendations: Target dosing levels will be at or below 60 MME/day. Use of benzodiazepines** is not recommended.  Exceptions: There are only two exceptions to the rule of not receiving pain medications from other Healthcare Providers. 1. Exception #1 (Emergencies): In the event of an emergency (i.e.: accident requiring emergency care), you are allowed to receive additional pain medication. However, you are responsible for: As soon as you are able, call our office [phone], at any time of the day or night, and leave a message stating your name, the  date and nature of the emergency, and the name and dose of the medication   prescribed. In the event that your call is answered by a member of our staff, make sure to document and save the date, time, and the name of the person that took your information.  2. Exception #2 (Planned Surgery): In the event that you are scheduled by another doctor or dentist to have any type of surgery or procedure, you are allowed (for a period no longer than 30 days), to receive additional pain medication, for the acute post-op pain. However, in this case, you are responsible for picking up a copy of our "Post-op Pain Management for Surgeons" handout, and giving it to your surgeon or dentist. This document is available at our office, and does not require an appointment to obtain it. Simply go to our office during business hours (Monday-Thursday from 8:00 AM to 4:00 PM) (Friday 8:00 AM to 12:00 Noon) or if you have a scheduled appointment with us, prior to your surgery, and ask for it by name. In addition, you will need to provide us with your name, name of your surgeon, type of surgery, and date of procedure or surgery.  *Opioid medications include: morphine, codeine, oxycodone, oxymorphone, hydrocodone, hydromorphone, meperidine, tramadol, tapentadol, buprenorphine, fentanyl, methadone. **Benzodiazepine medications include: diazepam (Valium), alprazolam (Xanax), clonazepam (Klonopine), lorazepam (Ativan), clorazepate (Tranxene), chlordiazepoxide (Librium), estazolam (Prosom), oxazepam (Serax), temazepam (Restoril), triazolam (Halcion) (Last updated: 10/28/2017) ____________________________________________________________________________________________   ____________________________________________________________________________________________  Pain Scale  Introduction: The pain score used by this practice is the Verbal Numerical Rating Scale (VNRS-11). This is an 11-point scale. It is for adults and children 10 years or  older. There are significant differences in how the pain score is reported, used, and applied. Forget everything you learned in the past and learn this scoring system.  General Information: The scale should reflect your current level of pain. Unless you are specifically asked for the level of your worst pain, or your average pain. If you are asked for one of these two, then it should be understood that it is over the past 24 hours.  Basic Activities of Daily Living (ADL): Personal hygiene, dressing, eating, transferring, and using restroom.  Instructions: Most patients tend to report their level of pain as a combination of two factors, their physical pain and their psychosocial pain. This last one is also known as "suffering" and it is reflection of how physical pain affects you socially and psychologically. From now on, report them separately. From this point on, when asked to report your pain level, report only your physical pain. Use the following table for reference.  Pain Clinic Pain Levels (0-5/10)  Pain Level Score  Description  No Pain 0   Mild pain 1 Nagging, annoying, but does not interfere with basic activities of daily living (ADL). Patients are able to eat, bathe, get dressed, toileting (being able to get on and off the toilet and perform personal hygiene functions), transfer (move in and out of bed or a chair without assistance), and maintain continence (able to control bladder and bowel functions). Blood pressure and heart rate are unaffected. A normal heart rate for a healthy adult ranges from 60 to 100 bpm (beats per minute).   Mild to moderate pain 2 Noticeable and distracting. Impossible to hide from other people. More frequent flare-ups. Still possible to adapt and function close to normal. It can be very annoying and may have occasional stronger flare-ups. With discipline, patients may get used to it and adapt.   Moderate pain 3 Interferes significantly with activities of daily  living (ADL).   It becomes difficult to feed, bathe, get dressed, get on and off the toilet or to perform personal hygiene functions. Difficult to get in and out of bed or a chair without assistance. Very distracting. With effort, it can be ignored when deeply involved in activities.   Moderately severe pain 4 Impossible to ignore for more than a few minutes. With effort, patients may still be able to manage work or participate in some social activities. Very difficult to concentrate. Signs of autonomic nervous system discharge are evident: dilated pupils (mydriasis); mild sweating (diaphoresis); sleep interference. Heart rate becomes elevated (>115 bpm). Diastolic blood pressure (lower number) rises above 100 mmHg. Patients find relief in laying down and not moving.   Severe pain 5 Intense and extremely unpleasant. Associated with frowning face and frequent crying. Pain overwhelms the senses.  Ability to do any activity or maintain social relationships becomes significantly limited. Conversation becomes difficult. Pacing back and forth is common, as getting into a comfortable position is nearly impossible. Pain wakes you up from deep sleep. Physical signs will be obvious: pupillary dilation; increased sweating; goosebumps; brisk reflexes; cold, clammy hands and feet; nausea, vomiting or dry heaves; loss of appetite; significant sleep disturbance with inability to fall asleep or to remain asleep. When persistent, significant weight loss is observed due to the complete loss of appetite and sleep deprivation.  Blood pressure and heart rate becomes significantly elevated. Caution: If elevated blood pressure triggers a pounding headache associated with blurred vision, then the patient should immediately seek attention at an urgent or emergency care unit, as these may be signs of an impending stroke.    Emergency Department Pain Levels (6-10/10)  Emergency Room Pain 6 Severely limiting. Requires emergency care  and should not be seen or managed at an outpatient pain management facility. Communication becomes difficult and requires great effort. Assistance to reach the emergency department may be required. Facial flushing and profuse sweating along with potentially dangerous increases in heart rate and blood pressure will be evident.   Distressing pain 7 Self-care is very difficult. Assistance is required to transport, or use restroom. Assistance to reach the emergency department will be required. Tasks requiring coordination, such as bathing and getting dressed become very difficult.   Disabling pain 8 Self-care is no longer possible. At this level, pain is disabling. The individual is unable to do even the most "basic" activities such as walking, eating, bathing, dressing, transferring to a bed, or toileting. Fine motor skills are lost. It is difficult to think clearly.   Incapacitating pain 9 Pain becomes incapacitating. Thought processing is no longer possible. Difficult to remember your own name. Control of movement and coordination are lost.   The worst pain imaginable 10 At this level, most patients pass out from pain. When this level is reached, collapse of the autonomic nervous system occurs, leading to a sudden drop in blood pressure and heart rate. This in turn results in a temporary and dramatic drop in blood flow to the brain, leading to a loss of consciousness. Fainting is one of the body's self defense mechanisms. Passing out puts the brain in a calmed state and causes it to shut down for a while, in order to begin the healing process.    Summary: 1. Refer to this scale when providing us with your pain level. 2. Be accurate and careful when reporting your pain level. This will help with your care. 3. Over-reporting your pain level will lead to loss of credibility. 4. Even   a level of 1/10 means that there is pain and will be treated at our facility. 5. High, inaccurate reporting will be  documented as "Symptom Exaggeration", leading to loss of credibility and suspicions of possible secondary gains such as obtaining more narcotics, or wanting to appear disabled, for fraudulent reasons. 6. Only pain levels of 5 or below will be seen at our facility. 7. Pain levels of 6 and above will be sent to the Emergency Department and the appointment cancelled. ____________________________________________________________________________________________    BMI Assessment: Estimated body mass index is 43.36 kg/m as calculated from the following:   Height as of this encounter: 5' (1.524 m).   Weight as of this encounter: 222 lb (100.7 kg).  BMI interpretation table: BMI level Category Range association with higher incidence of chronic pain  <18 kg/m2 Underweight   18.5-24.9 kg/m2 Ideal body weight   25-29.9 kg/m2 Overweight Increased incidence by 20%  30-34.9 kg/m2 Obese (Class I) Increased incidence by 68%  35-39.9 kg/m2 Severe obesity (Class II) Increased incidence by 136%  >40 kg/m2 Extreme obesity (Class III) Increased incidence by 254%   BMI Readings from Last 4 Encounters:  11/10/17 43.36 kg/m  10/27/17 43.94 kg/m  10/13/17 44.14 kg/m  09/02/17 43.94 kg/m   Wt Readings from Last 4 Encounters:  11/10/17 222 lb (100.7 kg)  10/27/17 225 lb (102.1 kg)  10/13/17 226 lb (102.5 kg)  09/02/17 225 lb (102.1 kg)

## 2017-11-10 NOTE — Progress Notes (Signed)
Nursing Pain Medication Assessment:  Safety precautions to be maintained throughout the outpatient stay will include: orient to surroundings, keep bed in low position, maintain call bell within reach at all times, provide assistance with transfer out of bed and ambulation.  Medication Inspection Compliance: Pill count conducted under aseptic conditions, in front of the patient. Neither the pills nor the bottle was removed from the patient's sight at any time. Once count was completed pills were immediately returned to the patient in their original bottle.  Medication: Oxycodone IR Pill/Patch Count: 32 of 60 pills remain Pill/Patch Appearance: Markings consistent with prescribed medication Bottle Appearance: Standard pharmacy container. Clearly labeled. Filled Date: 02 / 23 / 2019 Last Medication intake:  Yesterday

## 2018-01-04 ENCOUNTER — Other Ambulatory Visit: Payer: Self-pay | Admitting: Family Medicine

## 2018-01-04 DIAGNOSIS — F3342 Major depressive disorder, recurrent, in full remission: Secondary | ICD-10-CM

## 2018-01-22 DIAGNOSIS — J019 Acute sinusitis, unspecified: Secondary | ICD-10-CM | POA: Diagnosis not present

## 2018-01-22 DIAGNOSIS — J029 Acute pharyngitis, unspecified: Secondary | ICD-10-CM | POA: Diagnosis not present

## 2018-02-09 ENCOUNTER — Ambulatory Visit: Payer: Medicare Other | Attending: Nurse Practitioner | Admitting: Nurse Practitioner

## 2018-02-09 ENCOUNTER — Encounter: Payer: Self-pay | Admitting: Nurse Practitioner

## 2018-02-09 ENCOUNTER — Other Ambulatory Visit: Payer: Self-pay

## 2018-02-09 VITALS — BP 162/57 | HR 74 | Temp 98.0°F | Resp 16 | Ht 60.0 in | Wt 222.0 lb

## 2018-02-09 DIAGNOSIS — M47896 Other spondylosis, lumbar region: Secondary | ICD-10-CM | POA: Insufficient documentation

## 2018-02-09 DIAGNOSIS — M25551 Pain in right hip: Secondary | ICD-10-CM | POA: Insufficient documentation

## 2018-02-09 DIAGNOSIS — Z886 Allergy status to analgesic agent status: Secondary | ICD-10-CM | POA: Insufficient documentation

## 2018-02-09 DIAGNOSIS — M25552 Pain in left hip: Secondary | ICD-10-CM | POA: Insufficient documentation

## 2018-02-09 DIAGNOSIS — Z9049 Acquired absence of other specified parts of digestive tract: Secondary | ICD-10-CM | POA: Diagnosis not present

## 2018-02-09 DIAGNOSIS — G894 Chronic pain syndrome: Secondary | ICD-10-CM

## 2018-02-09 DIAGNOSIS — E1140 Type 2 diabetes mellitus with diabetic neuropathy, unspecified: Secondary | ICD-10-CM | POA: Insufficient documentation

## 2018-02-09 DIAGNOSIS — Z823 Family history of stroke: Secondary | ICD-10-CM | POA: Insufficient documentation

## 2018-02-09 DIAGNOSIS — Z79891 Long term (current) use of opiate analgesic: Secondary | ICD-10-CM | POA: Diagnosis not present

## 2018-02-09 DIAGNOSIS — Z9889 Other specified postprocedural states: Secondary | ICD-10-CM | POA: Diagnosis not present

## 2018-02-09 DIAGNOSIS — Z96651 Presence of right artificial knee joint: Secondary | ICD-10-CM | POA: Insufficient documentation

## 2018-02-09 DIAGNOSIS — Z8249 Family history of ischemic heart disease and other diseases of the circulatory system: Secondary | ICD-10-CM | POA: Insufficient documentation

## 2018-02-09 DIAGNOSIS — R51 Headache: Secondary | ICD-10-CM | POA: Diagnosis not present

## 2018-02-09 DIAGNOSIS — Z8261 Family history of arthritis: Secondary | ICD-10-CM | POA: Insufficient documentation

## 2018-02-09 DIAGNOSIS — M797 Fibromyalgia: Secondary | ICD-10-CM | POA: Diagnosis not present

## 2018-02-09 DIAGNOSIS — G4700 Insomnia, unspecified: Secondary | ICD-10-CM | POA: Insufficient documentation

## 2018-02-09 DIAGNOSIS — M8580 Other specified disorders of bone density and structure, unspecified site: Secondary | ICD-10-CM | POA: Insufficient documentation

## 2018-02-09 DIAGNOSIS — M5481 Occipital neuralgia: Secondary | ICD-10-CM | POA: Diagnosis not present

## 2018-02-09 DIAGNOSIS — E7800 Pure hypercholesterolemia, unspecified: Secondary | ICD-10-CM | POA: Insufficient documentation

## 2018-02-09 DIAGNOSIS — M47816 Spondylosis without myelopathy or radiculopathy, lumbar region: Secondary | ICD-10-CM | POA: Diagnosis not present

## 2018-02-09 DIAGNOSIS — I10 Essential (primary) hypertension: Secondary | ICD-10-CM | POA: Diagnosis not present

## 2018-02-09 DIAGNOSIS — L304 Erythema intertrigo: Secondary | ICD-10-CM | POA: Diagnosis not present

## 2018-02-09 DIAGNOSIS — Z809 Family history of malignant neoplasm, unspecified: Secondary | ICD-10-CM | POA: Insufficient documentation

## 2018-02-09 DIAGNOSIS — Z820 Family history of epilepsy and other diseases of the nervous system: Secondary | ICD-10-CM | POA: Insufficient documentation

## 2018-02-09 DIAGNOSIS — Z881 Allergy status to other antibiotic agents status: Secondary | ICD-10-CM | POA: Diagnosis not present

## 2018-02-09 DIAGNOSIS — F329 Major depressive disorder, single episode, unspecified: Secondary | ICD-10-CM | POA: Insufficient documentation

## 2018-02-09 DIAGNOSIS — J45909 Unspecified asthma, uncomplicated: Secondary | ICD-10-CM | POA: Diagnosis not present

## 2018-02-09 DIAGNOSIS — M1712 Unilateral primary osteoarthritis, left knee: Secondary | ICD-10-CM | POA: Diagnosis not present

## 2018-02-09 DIAGNOSIS — Z79899 Other long term (current) drug therapy: Secondary | ICD-10-CM | POA: Diagnosis not present

## 2018-02-09 DIAGNOSIS — E669 Obesity, unspecified: Secondary | ICD-10-CM | POA: Insufficient documentation

## 2018-02-09 DIAGNOSIS — Z833 Family history of diabetes mellitus: Secondary | ICD-10-CM | POA: Insufficient documentation

## 2018-02-09 DIAGNOSIS — Z90710 Acquired absence of both cervix and uterus: Secondary | ICD-10-CM | POA: Insufficient documentation

## 2018-02-09 MED ORDER — OXYCODONE HCL 5 MG PO TABS: 5.0000 mg | ORAL_TABLET | Freq: Two times a day (BID) | ORAL | 0 refills | Status: DC

## 2018-02-09 MED ORDER — OXYCODONE HCL 5 MG PO TABS: 5.0000 mg | ORAL_TABLET | Freq: Three times a day (TID) | ORAL | 0 refills | Status: DC | PRN

## 2018-02-09 NOTE — Progress Notes (Signed)
Patient's Name: Cynthia Jones  MRN: [id number]  Referring Provider: Guadalupe Maple, MD  DOB: Apr 11, 1941  PCP: Cynthia Maple, MD  DOS: 02/09/2018  Note by: Cynthia Francois NP  Service setting: Ambulatory outpatient  Specialty: Interventional Pain Management  Location: [facility] (AMB) Pain Management Facility    Patient type: Established    Primary Reason(s) for Visit: Encounter for prescription drug management. (Level of risk: moderate)  CC: Back Pain (lower)  HPI  Cynthia Jones is a 77 y.o. year old, female patient, who comes today for a medication management evaluation. She has Osteopenia; Depression; Essential hypertension; Diabetes mellitus ([place]); Osteoarthritis; Seasonal allergic rhinitis; Obesity; Intertrigo; Fibromyalgia; Bilateral occipital neuralgia; Greater trochanteric bursitis; Osteoarthritis of knee (Location of Primary Source of Pain) (Left); Sacroiliac joint disease; Lumbar facet syndrome (Bilateral) (L>R); Neuropathy due to secondary diabetes ([place]); S/P TKR Arthroplasty (Right); Folliculitis; Insomnia; Long term prescription opiate use; Long term prescription benzodiazepine use; Chronic pain syndrome; Chronic low back pain (Location of Secondary source of pain) (Bilateral) (L>R); Chronic knee pain (Location of Primary Source of Pain) (Bilateral) (L>R); Cervicogenic headache; Chronic hip pain (Bilateral); Chronic hand pain (Bilateral) (R>L); Chronic foot pain (Left); Hypercholesteremia; Lumbar spondylosis; and Advanced care planning/counseling discussion on their problem list. Her primarily concern today is the Back Pain (lower)  Pain Assessment: Location: Lower Back Radiating: denies Onset: More than a month ago Duration: Chronic pain Quality: Constant Severity: 4 /10 (subjective, self-reported pain score)  Note: Reported level is compatible with observation.                          Timing: Constant Modifying factors: medications BP: (!) 162/57  HR: 74  Cynthia Jones was  last scheduled for an appointment on 11/10/2017 for medication management. During today's appointment we reviewed Cynthia Jones's chronic pain status, as well as her outpatient medication regimen.  The patient  reports that she does not use drugs. Her body mass index is 43.36 kg/m.  Further details on both, my assessment(s), as well as the proposed treatment plan, please see below.  Controlled Substance Pharmacotherapy Assessment REMS (Risk Evaluation and Mitigation Strategy)  Analgesic:Oxycodone IR 5 mg 1 tablet by mouth twice a day MME/day:36m/day.    Cynthia Patience RN  02/09/2018  1:25 PM  Sign at close encounter Nursing Pain Medication Assessment:  Safety precautions to be maintained throughout the outpatient stay will include: orient to surroundings, keep bed in low position, maintain call bell within reach at all times, provide assistance with transfer out of bed and ambulation.  Medication Inspection Compliance: Pill count conducted under aseptic conditions, in front of the patient. Neither the pills nor the bottle was removed from the patient's sight at any time. Once count was completed pills were immediately returned to the patient in their original bottle.  Medication: Oxycodone IR Pill/Patch Count: Pill/Patch Appearance:  Bottle Appearance:  Filled Date:  Last Medication intake:  Yesterday   Pt brought pills in a bottle from a previous Rx fill due to the fact that the cap for current fill is difficult for pt to open. Pt instructed to bring current bottles in for med counts going forward.  Pt brought 6 pills. Pill markings are consistent with Rx.   Pharmacokinetics: Liberation and absorption (onset of action): WNL Distribution (time to peak effect): WNL Metabolism and excretion (duration of action): WNL         Pharmacodynamics: Desired effects: Analgesia: [name] >50% benefit. Functional ability:  Patient reports that medication allows her to  accomplish basic ADLs Clinically meaningful improvement in function (CMIF): Sustained CMIF goals met Perceived effectiveness: Described as relatively effective, allowing for increase in activities of daily living (ADL) Undesirable effects: Side-effects or Adverse reactions: None reported Monitoring: Cynthia Jones PMP: Online review of the past 84-monthperiod conducted. Compliant with practice rules and regulations Last UDS on record: Summary  Date Value Ref Range Status  02/10/2017 FINAL  Final    Comment:    ==================================================================== TOXASSURE SELECT 13 (MW) ==================================================================== Test                             Result       Flag       Units Drug Present and Declared for Prescription Verification   Oxycodone                      141          EXPECTED   ng/mg creat   Noroxycodone                   938          EXPECTED   ng/mg creat    Sources of oxycodone include scheduled prescription medications.    Noroxycodone is an expected metabolite of oxycodone. Drug Absent but Declared for Prescription Verification   Alprazolam                     Not Detected UNEXPECTED ng/mg creat ==================================================================== Test                      Result    Flag   Units      Ref Range   Creatinine              146              mg/dL      >=20 ==================================================================== Declared Medications:  The flagging and interpretation on this report are based on the  following declared medications.  Unexpected results may arise from  inaccuracies in the declared medications.  **Note: The testing scope of this panel includes these medications:  Alprazolam  Oxycodone  **Note: The testing scope of this panel does not include following  reported medications:  Carvedilol  Chlorhexidine  Duloxetine  Esomeprazole  Losartan (Losartan Potassium)   Nystatin ==================================================================== For clinical consultation, please call [phone]====    UDS interpretation: Compliant          Medication Assessment Form: Reviewed. Patient indicates being compliant with therapy Treatment compliance: Compliant Risk Assessment Profile: Aberrant behavior: See prior evaluations. None observed or detected today Comorbid factors increasing risk of overdose: See prior notes. No additional risks detected today Risk of substance use disorder (SUD): Low Opioid Risk Tool - 02/09/18 1331      Family History of Substance Abuse   Alcohol  Negative    Illegal Drugs  Negative    Rx Drugs  Negative      Personal History of Substance Abuse   Alcohol  Negative    Illegal Drugs  Negative    Rx Drugs  Negative      Age   Age between 165-45years   No      Psychological Disease   Psychological Disease  Negative    Depression  Negative      Total Score  Opioid Risk Tool Scoring  0    Opioid Risk Interpretation  Low Risk      ORT Scoring interpretation table:  Score <3 = Low Risk for SUD  Score between 4-7 = Moderate Risk for SUD  Score >8 = High Risk for Opioid Abuse   Risk Mitigation Strategies:  Patient Counseling: Covered Patient-Prescriber Agreement (PPA): Present and active  Notification to other healthcare providers: Done  Pharmacologic Plan: No change in therapy, at this time.             Laboratory Chemistry  Inflammation Markers (CRP: Acute Phase) (ESR: Chronic Phase) Lab Results  Component Value Date   CRP 1.1 (H) 09/24/2016   ESRSEDRATE 44 (H) 09/24/2016                         Rheumatology Markers No results found for: RF, ANA, LABURIC, URICUR, LYMEIGGIGMAB, LYMEABIGMQN, HLAB27                      Renal Function Markers Lab Results  Component Value Date   BUN 11 10/13/2017   CREATININE 0.75 10/13/2017   BCR 15  10/13/2017   GFRAA 91 10/13/2017   GFRNONAA 79 10/13/2017                             Hepatic Function Markers Lab Results  Component Value Date   AST 20 10/13/2017   ALT 24 10/13/2017   ALBUMIN 4.1 04/12/2017   ALKPHOS 49 04/12/2017                        Electrolytes Lab Results  Component Value Date   NA 142 10/13/2017   K 4.2 10/13/2017   CL 103 10/13/2017   CALCIUM 9.6 10/13/2017   MG 2.1 09/24/2016                        Neuropathy Markers Lab Results  Component Value Date   VITAMINB12 344 09/24/2016   HGBA1C 6.6 04/08/2016                        Bone Pathology Markers Lab Results  Component Value Date   25OHVITD1 20 (L) 09/24/2016   25OHVITD2 <1.0 09/24/2016   25OHVITD3 19 09/24/2016                         Coagulation Parameters Lab Results  Component Value Date   PLT 328 04/12/2017                        Cardiovascular Markers Lab Results  Component Value Date   HGB 13.2 04/12/2017   HCT 39.9 04/12/2017                         CA Markers No results found for: CEA, CA125, LABCA2                      Note: Lab results reviewed.  Recent Diagnostic Imaging Results   Complexity Note: Imaging results reviewed. Results shared with Ms. Fulfer, using Layman's terms.                         Meds  Current Outpatient Medications:  .  ALPRAZolam (XANAX) 0.25 MG tablet, Take 1 tablet (0.25 mg total) by mouth 2 (two) times daily as needed for anxiety., Disp: 60 tablet, Rfl: 4 .  carvedilol (COREG) 12.5 MG tablet, Take 1 tablet (12.5 mg total) by mouth 2 (two) times daily with a meal., Disp: 180 tablet, Rfl: 4 .  DULoxetine (CYMBALTA) 60 MG capsule, TAKE 1 CAPSULE BY MOUTH EVERY DAY, Disp: 90 capsule, Rfl: 2 .  esomeprazole (NEXIUM) 40 MG capsule, Take 40 mg by mouth daily at 12 noon., Disp: , Rfl:  .  glucose blood (IGLUCOSE TEST STRIPS) test strip, Use as instructed - freestyle insulin x strip, Disp: 100 each, Rfl: 12 .  losartan (COZAAR) 100 MG  tablet, TAKE 1 TABLET (100 MG TOTAL) BY MOUTH DAILY., Disp: 90 tablet, Rfl: 2 .  nystatin (NYSTATIN) powder, Apply topically 4 (four) times daily., Disp: 45 g, Rfl: 4 .  [START ON 04/21/2018] oxyCODONE (OXY IR/ROXICODONE) 5 MG immediate release tablet, Take 1 tablet (5 mg total) by mouth 2 (two) times daily., Disp: 60 tablet, Rfl: 0 .  trimethoprim-polymyxin b (POLYTRIM) ophthalmic solution, Place 1 drop into both eyes every 4 (four) hours., Disp: 10 mL, Rfl: 0 .  [START ON 03/22/2018] oxyCODONE (OXY IR/ROXICODONE) 5 MG immediate release tablet, Take 1 tablet (5 mg total) by mouth 2 (two) times daily., Disp: 60 tablet, Rfl: 0 .  [START ON 02/20/2018] oxyCODONE (OXY IR/ROXICODONE) 5 MG immediate release tablet, Take 1 tablet (5 mg total) by mouth 2 (two) times daily., Disp: 60 tablet, Rfl: 0  ROS  Constitutional: Denies any fever or chills Gastrointestinal: No reported hemesis, hematochezia, vomiting, or acute GI distress Musculoskeletal: Denies any acute onset joint swelling, redness, loss of ROM, or weakness Neurological: No reported episodes of acute onset apraxia, aphasia, dysarthria, agnosia, amnesia, paralysis, loss of coordination, or loss of consciousness  Allergies  Ms. Hunger is allergic to nsaids and erythromycin.  [place]  Drug: Ms. Mcelwee  reports that she does not use drugs. Alcohol:  reports that she does not drink alcohol. Tobacco:  reports that she has never smoked. She has never used smokeless tobacco. Medical:  has a past medical history of Acute anxiety (03/06/2015), Allergy, Anxiety, Degenerative joint disease of knee, Depression, Fibromyalgia, Obesity, Osteopenia, and RAD (reactive airway disease). Surgical: Ms. Posas  has a past surgical history that includes Parotidectomy (Left); Appendectomy; Cholecystectomy; Abdominal hysterectomy; Tonsillectomy; Joint replacement (2014); Eye surgery; and Breast cyst aspiration (Right, 04/18/2013). Family: family history includes  Alzheimer's disease in her father; Cancer in her sister; Diabetes in her mother; Heart disease in her mother; Parkinson's disease in her father; Stroke in her mother.  Constitutional Exam  General appearance: Well nourished, well developed, and well hydrated. In no apparent acute distress Vitals:   02/09/18 1325  BP: (!) 162/57  Pulse: 74  Resp: 16  Temp: 98 F (36.7 C)  TempSrc: Oral  SpO2: 99%  Weight: 222 lb (100.7 kg)  Height: 5' (1.524 m)  Psych/Mental status: Alert, oriented x 3 (person, place, & time)       Eyes: PERLA Respiratory: No evidence of acute respiratory distress  Lumbar Spine Area Exam  Skin & Axial Inspection: No masses, redness, or swelling Alignment: Symmetrical Functional ROM: Unrestricted ROM       Stability: No instability detected Muscle Tone/Strength: Functionally intact. No obvious neuro-muscular anomalies detected. Sensory (Neurological): Unimpaired Palpation: Complains of area being tender to palpation       Provocative Tests: Lumbar  Hyperextension/rotation test: deferred today       Lumbar quadrant test (Kemp's test): deferred today       Lumbar Lateral bending test: deferred today       Patrick's Maneuver: deferred today                   FABER test: deferred today       Thigh-thrust test: deferred today       S-I compression test: deferred today       S-I distraction test: deferred today        Gait & Posture Assessment  Ambulation: Patient ambulates using a cane Gait: Relatively normal for age and body habitus Posture: WNL   Lower Extremity Exam    Side: Right lower extremity  Side: Left lower extremity  Stability: No instability observed          Stability: No instability observed          Skin & Extremity Inspection: Skin color, temperature, and hair growth are WNL. No peripheral edema or cyanosis. No masses, redness, swelling, asymmetry, or associated skin lesions. No contractures.  Skin & Extremity Inspection: Skin color, temperature,  and hair growth are WNL. No peripheral edema or cyanosis. No masses, redness, swelling, asymmetry, or associated skin lesions. No contractures.  Functional ROM: Unrestricted ROM                  Functional ROM: Unrestricted ROM                  Muscle Tone/Strength: Functionally intact. No obvious neuro-muscular anomalies detected.  Muscle Tone/Strength: Functionally intact. No obvious neuro-muscular anomalies detected.  Sensory (Neurological): Unimpaired  Sensory (Neurological): Unimpaired  Palpation: No palpable anomalies  Palpation: No palpable anomalies   Assessment  Primary Diagnosis & Pertinent Problem List: The primary encounter diagnosis was Lumbar spondylosis. Diagnoses of Fibromyalgia, Osteoarthritis of knee (Location of Primary Source of Pain) (Left), Chronic pain syndrome, and Long term prescription opiate use were also pertinent to this visit.  Status Diagnosis  Controlled Controlled Controlled 1. Lumbar spondylosis   2. Fibromyalgia   3. Osteoarthritis of knee (Location of Primary Source of Pain) (Left)   4. Chronic pain syndrome   5. Long term prescription opiate use     Problems updated and reviewed during this visit: No problems updated. Plan of Care  Pharmacotherapy (Medications Ordered): Meds ordered this encounter  Medications  . oxyCODONE (OXY IR/ROXICODONE) 5 MG immediate release tablet    Sig: Take 1 tablet (5 mg total) by mouth 2 (two) times daily.    Dispense:  60 tablet    Refill:  0    Fill one day early if pharmacy is closed on scheduled refill date. Do not fill until: 04/21/2018 To last until:05/21/2018    Order Specific Question:   Supervising Provider    Answer:   Milinda Pointer [phone]  . oxyCODONE (OXY IR/ROXICODONE) 5 MG immediate release tablet    Sig: Take 1 tablet (5 mg total) by mouth 2 (two) times daily.    Dispense:  60 tablet    Refill:  0    Fill one day early if pharmacy is closed on scheduled refill date. Do not fill until:  03/22/2018 To last until: 04/21/2018    Order Specific Question:   Supervising Provider    Answer:   Milinda Pointer [phone]  . oxyCODONE (OXY IR/ROXICODONE) 5 MG immediate release tablet    Sig: Take 1 tablet (5 mg total)  by mouth 2 (two) times daily.    Dispense:  60 tablet    Refill:  0    Fill one day early if pharmacy is closed on scheduled refill date. Do not fill until: 02/20/2018 To last until:03/22/2018    Order Specific Question:   Supervising Provider    Answer:   Milinda Pointer [phone]   New Prescriptions   No medications on file   Medications administered today: Grayling Congress had no medications administered during this visit. Lab-work, procedure(s), and/or referral(s): Orders Placed This Encounter  Procedures  . ToxASSURE Select 13 (MW), Urine   Imaging and/or referral(s): None  Interventional therapies: Planned, scheduled, and/or pending:  Not at this time.   Considering:  Diagnostic right Genicular nerve block Possible right Genicular nerve radiofrequencyablation  Diagnosticleft intra-articular knee joint injection Possible series of 5 left intra-articular Hyalgan knee injections Diagnostic left Genicular nerve block Possibleleft Genicular nerve RFA Diagnostic bilateral lumbar facet block Possible bilateral lumbar facet RFA Diagnostic left lumbar epidural steroid injection Diagnostic caudal epidural steroid injection + epidurogram Possible Racz procedure Diagnostic bilateral cervical facet block Possible bilateral cervical facet RFA Diagnostic right versus left cervical epidural steroid injection Diagnostic bilateral intra-articular shoulder joint injection Diagnostic bilateral suprascapular nerve block Possible bilateral suprascapular nerve RFA   Palliative PRN treatment(s):  PalliativebilateralGenicular nerve block Palliativeleft intra-articular knee joint injection Palliativebilateral lumbar facet block   Palliativeleft lumbar epidural steroid injection  Palliativecaudal epiduralsteroid injection + epidurogram  Palliativebilateral cervical facet block Palliativeright versus left cervical epidural steroid injection  Palliativebilateral intra-articular shoulderjoint injection  Palliativebilateral suprascapularnerve block      Provider-requested follow-up: Return in about 3 months (around 05/12/2018) for MedMgmt with Me Donella Stade Edison Pace).  Future Appointments  Date Time Provider [place]  04/07/2018 10:15 AM CFP NURSE HEALTH ADVISOR [facility]  04/14/2018  9:00 AM Crissman, Jeannette How, MD [facility]   Primary Care Physician: Cynthia Maple, MD Location: [facility] Outpatient Pain Management Facility Note by: Cynthia Francois NP Date: 02/09/2018; Time: 1:57 PM  Pain Score Disclaimer: We use the NRS-11 scale. This is a self-reported, subjective measurement of pain severity with only modest accuracy. It is used primarily to identify changes within a particular patient. It must be understood that outpatient pain scales are significantly less accurate that those used for research, where they can be applied under ideal controlled circumstances with minimal exposure to variables. In reality, the score is likely to be a combination of pain intensity and pain affect, where pain affect describes the degree of emotional arousal or changes in action readiness caused by the sensory experience of pain. Factors such as social and work situation, setting, emotional state, anxiety levels, expectation, and prior pain experience may influence pain perception and show large inter-individual differences that may also be affected by time variables.  Patient instructions provided during this appointment: Patient Instructions   ____________________________________________________________________________________________  Medication Rules  Applies to: All patients receiving prescriptions (written or  electronic).  Pharmacy of record: Pharmacy where electronic prescriptions will be sent. If written prescriptions are taken to a different pharmacy, please inform the nursing staff. The pharmacy listed in the electronic medical record should be the one where you would like electronic prescriptions to be sent.  Prescription refills: Only during scheduled appointments. Applies to both, written and electronic prescriptions.  NOTE: The following applies primarily to controlled substances (Opioid* Pain Medications).   Patient's responsibilities: 1. Pain Pills: Bring all pain pills to every appointment (except for procedure appointments). 2. Pill  Bottles: Bring pills in original pharmacy bottle. Always bring newest bottle. Bring bottle, even if empty. 3. Medication refills: You are responsible for knowing and keeping track of what medications you need refilled. The day before your appointment, write a list of all prescriptions that need to be refilled. Bring that list to your appointment and give it to the admitting nurse. Prescriptions will be written only during appointments. If you forget a medication, it will not be "Called in", "Faxed", or "electronically sent". You will need to get another appointment to get these prescribed. 4. Prescription Accuracy: You are responsible for carefully inspecting your prescriptions before leaving our office. Have the discharge nurse carefully go over each prescription with you, before taking them home. Make sure that your name is accurately spelled, that your address is correct. Check the name and dose of your medication to make sure it is accurate. Check the number of pills, and the written instructions to make sure they are clear and accurate. Make sure that you are given enough medication to last until your next medication refill appointment. 5. Taking Medication: Take medication as prescribed. Never take more pills than instructed. Never take medication more  frequently than prescribed. Taking less pills or less frequently is permitted and encouraged, when it comes to controlled substances (written prescriptions).  6. Inform other Doctors: Always inform, all of your healthcare providers, of all the medications you take. 7. Pain Medication from other Providers: You are not allowed to accept any additional pain medication from any other Doctor or Healthcare provider. There are two exceptions to this rule. (see below) In the event that you require additional pain medication, you are responsible for notifying us, as stated below. 8. Medication Agreement: You are responsible for carefully reading and following our Medication Agreement. This must be signed before receiving any prescriptions from our practice. Safely store a copy of your signed Agreement. Violations to the Agreement will result in no further prescriptions. (Additional copies of our Medication Agreement are available upon request.) 9. Laws, Rules, & Regulations: All patients are expected to follow all Federal and [employer], [employer], Rules, [employer]. Ignorance of the Laws does not constitute a valid excuse. The use of any illegal substances is prohibited. 10. Adopted CDC guidelines & recommendations: Target dosing levels will be at or below 60 MME/day. Use of benzodiazepines** is not recommended.  Exceptions: There are only two exceptions to the rule of not receiving pain medications from other Healthcare Providers. 1. Exception #1 (Emergencies): In the event of an emergency (i.e.: accident requiring emergency care), you are allowed to receive additional pain medication. However, you are responsible for: As soon as you are able, call our office [phone], at any time of the day or night, and leave a message stating your name, the date and nature of the emergency, and the name and dose of the medication prescribed. In the event that your call is answered by a member of our staff, make sure to  document and save the date, time, and the name of the person that took your information.  2. Exception #2 (Planned Surgery): In the event that you are scheduled by another doctor or dentist to have any type of surgery or procedure, you are allowed (for a period no longer than 30 days), to receive additional pain medication, for the acute post-op pain. However, in this case, you are responsible for picking up a copy of our "Post-op Pain Management for Surgeons" handout, and giving it to your  surgeon or dentist. This document is available at our office, and does not require an appointment to obtain it. Simply go to our office during business hours (Monday-Thursday from 8:00 AM to 4:00 PM) (Friday 8:00 AM to 12:00 Noon) or if you have a scheduled appointment with [place], prior to your surgery, and ask for it by name. In addition, you will need to provide [place] with your name, name of your surgeon, type of surgery, and date of procedure or surgery.  *Opioid medications include: morphine, codeine, oxycodone, oxymorphone, hydrocodone, hydromorphone, meperidine, tramadol, tapentadol, buprenorphine, fentanyl, methadone. **Benzodiazepine medications include: diazepam (Valium), alprazolam (Xanax), clonazepam (Klonopine), lorazepam (Ativan), clorazepate (Tranxene), chlordiazepoxide (Librium), estazolam (Prosom), oxazepam (Serax), temazepam (Restoril), triazolam (Halcion) (Last updated: 10/28/2017) ____________________________________________________________________________________________    BMI Assessment: Estimated body mass index is 43.36 kg/m as calculated from the following:   Height as of this encounter: 5' (1.524 m).   Weight as of this encounter: 222 lb (100.7 kg).  BMI interpretation table: BMI level Category Range association with higher incidence of chronic pain  <18 kg/m2 Underweight   18.5-24.9 kg/m2 Ideal body weight   25-29.9 kg/m2 Overweight Increased incidence by 20%  30-34.9 kg/m2 Obese (Class I)  Increased incidence by 68%  35-39.9 kg/m2 Severe obesity (Class II) Increased incidence by 136%  >40 kg/m2 Extreme obesity (Class III) Increased incidence by 254%   Patient's current BMI Ideal Body weight  Body mass index is 43.36 kg/m. Ideal body weight: 45.5 kg (100 lb 4.9 oz) Adjusted ideal body weight: 67.6 kg (148 lb 15.8 oz)   BMI Readings from Last 4 Encounters:  02/09/18 43.36 kg/m  11/10/17 43.36 kg/m  10/27/17 43.94 kg/m  10/13/17 44.14 kg/m   Wt Readings from Last 4 Encounters:  02/09/18 222 lb (100.7 kg)  11/10/17 222 lb (100.7 kg)  10/27/17 225 lb (102.1 kg)  10/13/17 226 lb (102.5 kg)

## 2018-02-09 NOTE — Patient Instructions (Signed)
____________________________________________________________________________________________  Medication Rules  Applies to: All patients receiving prescriptions (written or electronic).  Pharmacy of record: Pharmacy where electronic prescriptions will be sent. If written prescriptions are taken to a different pharmacy, please inform the nursing staff. The pharmacy listed in the electronic medical record should be the one where you would like electronic prescriptions to be sent.  Prescription refills: Only during scheduled appointments. Applies to both, written and electronic prescriptions.  NOTE: The following applies primarily to controlled substances (Opioid* Pain Medications).   Patient's responsibilities: 1. Pain Pills: Bring all pain pills to every appointment (except for procedure appointments). 2. Pill Bottles: Bring pills in original pharmacy bottle. Always bring newest bottle. Bring bottle, even if empty. 3. Medication refills: You are responsible for knowing and keeping track of what medications you need refilled. The day before your appointment, write a list of all prescriptions that need to be refilled. Bring that list to your appointment and give it to the admitting nurse. Prescriptions will be written only during appointments. If you forget a medication, it will not be "Called in", "Faxed", or "electronically sent". You will need to get another appointment to get these prescribed. 4. Prescription Accuracy: You are responsible for carefully inspecting your prescriptions before leaving our office. Have the discharge nurse carefully go over each prescription with you, before taking them home. Make sure that your name is accurately spelled, that your address is correct. Check the name and dose of your medication to make sure it is accurate. Check the number of pills, and the written instructions to make sure they are clear and accurate. Make sure that you are given enough medication to last  until your next medication refill appointment. 5. Taking Medication: Take medication as prescribed. Never take more pills than instructed. Never take medication more frequently than prescribed. Taking less pills or less frequently is permitted and encouraged, when it comes to controlled substances (written prescriptions).  6. Inform other Doctors: Always inform, all of your healthcare providers, of all the medications you take. 7. Pain Medication from other Providers: You are not allowed to accept any additional pain medication from any other Doctor or Healthcare provider. There are two exceptions to this rule. (see below) In the event that you require additional pain medication, you are responsible for notifying us, as stated below. 8. Medication Agreement: You are responsible for carefully reading and following our Medication Agreement. This must be signed before receiving any prescriptions from our practice. Safely store a copy of your signed Agreement. Violations to the Agreement will result in no further prescriptions. (Additional copies of our Medication Agreement are available upon request.) 9. Laws, Rules, & Regulations: All patients are expected to follow all Federal and State Laws, Statutes, Rules, & Regulations. Ignorance of the Laws does not constitute a valid excuse. The use of any illegal substances is prohibited. 10. Adopted CDC guidelines & recommendations: Target dosing levels will be at or below 60 MME/day. Use of benzodiazepines** is not recommended.  Exceptions: There are only two exceptions to the rule of not receiving pain medications from other Healthcare Providers. 1. Exception #1 (Emergencies): In the event of an emergency (i.e.: accident requiring emergency care), you are allowed to receive additional pain medication. However, you are responsible for: As soon as you are able, call our office [phone], at any time of the day or night, and leave a message stating your name, the  date and nature of the emergency, and the name and dose of the medication   prescribed. In the event that your call is answered by a member of our staff, make sure to document and save the date, time, and the name of the person that took your information.  2. Exception #2 (Planned Surgery): In the event that you are scheduled by another doctor or dentist to have any type of surgery or procedure, you are allowed (for a period no longer than 30 days), to receive additional pain medication, for the acute post-op pain. However, in this case, you are responsible for picking up a copy of our "Post-op Pain Management for Surgeons" handout, and giving it to your surgeon or dentist. This document is available at our office, and does not require an appointment to obtain it. Simply go to our office during business hours (Monday-Thursday from 8:00 AM to 4:00 PM) (Friday 8:00 AM to 12:00 Noon) or if you have a scheduled appointment with [place], prior to your surgery, and ask for it by name. In addition, you will need to provide [place] with your name, name of your surgeon, type of surgery, and date of procedure or surgery.  *Opioid medications include: morphine, codeine, oxycodone, oxymorphone, hydrocodone, hydromorphone, meperidine, tramadol, tapentadol, buprenorphine, fentanyl, methadone. **Benzodiazepine medications include: diazepam (Valium), alprazolam (Xanax), clonazepam (Klonopine), lorazepam (Ativan), clorazepate (Tranxene), chlordiazepoxide (Librium), estazolam (Prosom), oxazepam (Serax), temazepam (Restoril), triazolam (Halcion) (Last updated: 10/28/2017) ____________________________________________________________________________________________    BMI Assessment: Estimated body mass index is 43.36 kg/m as calculated from the following:   Height as of this encounter: 5' (1.524 m).   Weight as of this encounter: 222 lb (100.7 kg).  BMI interpretation table: BMI level Category Range association with higher  incidence of chronic pain  <18 kg/m2 Underweight   18.5-24.9 kg/m2 Ideal body weight   25-29.9 kg/m2 Overweight Increased incidence by 20%  30-34.9 kg/m2 Obese (Class I) Increased incidence by 68%  35-39.9 kg/m2 Severe obesity (Class II) Increased incidence by 136%  >40 kg/m2 Extreme obesity (Class III) Increased incidence by 254%   Patient's current BMI Ideal Body weight  Body mass index is 43.36 kg/m. Ideal body weight: 45.5 kg (100 lb 4.9 oz) Adjusted ideal body weight: 67.6 kg (148 lb 15.8 oz)   BMI Readings from Last 4 Encounters:  02/09/18 43.36 kg/m  11/10/17 43.36 kg/m  10/27/17 43.94 kg/m  10/13/17 44.14 kg/m   Wt Readings from Last 4 Encounters:  02/09/18 222 lb (100.7 kg)  11/10/17 222 lb (100.7 kg)  10/27/17 225 lb (102.1 kg)  10/13/17 226 lb (102.5 kg)

## 2018-02-09 NOTE — Progress Notes (Signed)
Nursing Pain Medication Assessment:  Safety precautions to be maintained throughout the outpatient stay will include: orient to surroundings, keep bed in low position, maintain call bell within reach at all times, provide assistance with transfer out of bed and ambulation.  Medication Inspection Compliance: Pill count conducted under aseptic conditions, in front of the patient. Neither the pills nor the bottle was removed from the patient's sight at any time. Once count was completed pills were immediately returned to the patient in their original bottle.  Medication: Oxycodone IR Pill/Patch Count: Pill/Patch Appearance:  Bottle Appearance:  Filled Date:  Last Medication intake:  Yesterday   Pt brought pills in a bottle from a previous Rx fill due to the fact that the cap for current fill is difficult for pt to open. Pt instructed to bring current bottles in for med counts going forward.  Pt brought 6 pills. Pill markings are consistent with Rx.

## 2018-02-16 LAB — TOXASSURE SELECT 13 (MW), URINE

## 2018-03-07 ENCOUNTER — Ambulatory Visit: Payer: Self-pay | Admitting: Family Medicine

## 2018-03-07 DIAGNOSIS — S93602A Unspecified sprain of left foot, initial encounter: Secondary | ICD-10-CM | POA: Diagnosis not present

## 2018-03-07 NOTE — Telephone Encounter (Signed)
Pt called because of left foot pain and swelling. Pt stated that she was walking down a ramp last Wednesday and lost her footing and stumbled down the ramp. Pt stated she did not fall but now her left foot is swollen and painful with walking.  Pt has been treating the pain with ice and oxycodone. Advised pt to go to [facility] for evaluation. Pt was previously given an appointment for Wednesday and she intends on keeping the appointment.  Reason for Disposition . [1] MODERATE pain (e.g., interferes with normal activities, limping) AND [2] present > 3 days  Answer Assessment - Initial Assessment Questions 1. ONSET: "When did the pain start?"      Last Wednesday 2. LOCATION: "Where is the pain located?"      Left foot on top of the foot  3. PAIN: "How bad is the pain?"    (Scale 1-10; or mild, moderate, severe)   -  MILD (1-3): doesn't interfere with normal activities    -  MODERATE (4-7): interferes with normal activities (e.g., work or school) or awakens from sleep, limping    -  SEVERE (8-10): excruciating pain, unable to do any normal activities, unable to walk    moderate 4. WORK OR EXERCISE: "Has there been any recent work or exercise that involved this part of the body?"      no 5. CAUSE: "What do you think is causing the foot pain?"     Pt doesn't know 6. OTHER SYMPTOMS: "Do you have any other symptoms?" (e.g., leg pain, rash, fever, numbness)     Swelling, pain with walking 7. PREGNANCY: "Is there any chance you are pregnant?" "When was your last menstrual period?"     n/a  Protocols used: FOOT PAIN-A-AH

## 2018-03-09 ENCOUNTER — Encounter: Payer: Self-pay | Admitting: Family Medicine

## 2018-03-09 ENCOUNTER — Ambulatory Visit (INDEPENDENT_AMBULATORY_CARE_PROVIDER_SITE_OTHER): Payer: Medicare Other | Admitting: Family Medicine

## 2018-03-09 ENCOUNTER — Telehealth: Payer: Self-pay | Admitting: Family Medicine

## 2018-03-09 DIAGNOSIS — S9032XA Contusion of left foot, initial encounter: Secondary | ICD-10-CM | POA: Diagnosis not present

## 2018-03-09 DIAGNOSIS — S9030XA Contusion of unspecified foot, initial encounter: Secondary | ICD-10-CM | POA: Insufficient documentation

## 2018-03-09 NOTE — Progress Notes (Signed)
   BP 138/71   Pulse 65   SpO2 98%    Subjective:    Patient ID: Cynthia Jones, female    DOB: [birth year], 77 y.o.   MRN: [id number]  HPI: Cynthia Jones is a 77 y.o. female  Chief Complaint  Patient presents with  . Foot Pain    Left foot. Seen by urgent Care.   Patient with onset of left foot surface pain.  Days now walk-in clinic got an x-ray 3 days ago is been in a walking boot since has continued oxycodone as prescribed by the pain clinic is doing okay.  More detail copied from note from phone call on 03/07/2018 Pt called because of left foot pain and swelling. Pt stated that she was walking down a ramp last Wednesday and lost her footing and stumbled down the ramp. Pt stated she did not fall but now her left foot is swollen and painful with walking.  Pt has been treating the pain with ice and oxycodone. Advised pt to go to [facility] for evaluation. Pt was previously given an appointment for Wednesday and she intends on keeping the appointment.   [facility]ed [facility] got [facility]-ray report which was negative for broken bones. Patient is already been given a walking boot.  Relevant past medical, surgical, family and social history reviewed and updated as indicated. Interim medical history since our last visit reviewed. Allergies and medications reviewed and updated.  Review of Systems  Constitutional: Negative.   Respiratory: Negative.   Cardiovascular: Negative.     Per HPI unless specifically indicated above     Objective:    BP 138/71   Pulse 65   SpO2 98%   Wt Readings from Last 3 Encounters:  02/09/18 222 lb (100.7 kg)  11/10/17 222 lb (100.7 kg)  10/27/17 225 lb (102.1 kg)    Physical Exam  Constitutional: She is oriented to person, place, and time. She appears well-developed and well-nourished.  HENT:  Head: Normocephalic and atraumatic.  Eyes: Conjunctivae and EOM are normal.  Neck: Normal range of motion.  Cardiovascular: Normal rate, regular rhythm and normal  heart sounds.  Pulmonary/Chest: Effort normal and breath sounds normal.  Musculoskeletal: Normal range of motion.  Left foot anterior surface in the midline very tender slightly red rest of foot exam normal.  Neurological: She is alert and oriented to person, place, and time.  Skin: No erythema.  Psychiatric: She has a normal mood and affect. Her behavior is normal. Judgment and thought content normal.    Results for orders placed or performed in visit on 02/09/18  ToxASSURE Select 13 (MW), Urine  Result Value Ref Range   Summary FINAL       Assessment & Plan:   Problem List Items Addressed This Visit      Other   Contusion of foot    Discussed foot contusion care and treatment patient will continue to wear walking boot for 1 to 2 weeks as long as is more comfortable on than off.  Will remain somewhat active but also propping her foot up when she can.  Reviewed elevation impression edema control.          Follow up plan: No follow-ups on file.

## 2018-03-09 NOTE — Assessment & Plan Note (Signed)
Discussed foot contusion care and treatment patient will continue to wear walking boot for 1 to 2 weeks as long as is more comfortable on than off.  Will remain somewhat active but also propping her foot up when she can.  Reviewed elevation impression edema control.

## 2018-03-09 NOTE — Telephone Encounter (Signed)
Patient had Xray read over the phone to provider.

## 2018-03-09 NOTE — Telephone Encounter (Signed)
Received a call from [facility] stating Cynthia Jones requested their med records on this patient but they state they will need the patient to sign a med release form in order to get these records.  Fax number [phone]  Thanks

## 2018-04-07 ENCOUNTER — Ambulatory Visit (INDEPENDENT_AMBULATORY_CARE_PROVIDER_SITE_OTHER): Payer: Medicare Other

## 2018-04-07 VITALS — BP 132/83 | HR 80 | Temp 98.0°F | Resp 16 | Ht 59.0 in | Wt 219.1 lb

## 2018-04-07 DIAGNOSIS — S41111A Laceration without foreign body of right upper arm, initial encounter: Secondary | ICD-10-CM | POA: Diagnosis not present

## 2018-04-07 DIAGNOSIS — Z0000 Encounter for general adult medical examination without abnormal findings: Secondary | ICD-10-CM | POA: Diagnosis not present

## 2018-04-07 DIAGNOSIS — Z23 Encounter for immunization: Secondary | ICD-10-CM

## 2018-04-07 NOTE — Patient Instructions (Addendum)
Cynthia Jones , Thank you for taking time to come for your Medicare Wellness Visit. I appreciate your ongoing commitment to your health goals. Please review the following plan we discussed and let me know if I can assist you in the future.   Screening recommendations/referrals: Colonoscopy: completed 04/22/2012 Mammogram: completed 11/03/2017 Bone Density: completed 03/26/2014 Recommended yearly ophthalmology/optometry visit for glaucoma screening and checkup Recommended yearly dental visit for hygiene and checkup  Vaccinations: Influenza vaccine:  Due 05/2018 Pneumococcal vaccine: completed series Tdap vaccine: TDAP completed today Shingles vaccine: shingrix eligible, check with your insurance company for coverage   Advanced directives: Please bring a copy of your health care power of attorney and living will to the office at your convenience.  Conditions/risks identified: Recommend drinking at least 6-8 glasses of water a day  Next appointment: Follow up in one year for your annual wellness exam.    Preventive Care 75 Years and Older, Female Preventive care refers to lifestyle choices and visits with your health care provider that can promote health and wellness. What does preventive care include?  A yearly physical exam. This is also called an annual well check.  Dental exams once or twice a year.  Routine eye exams. Ask your health care provider how often you should have your eyes checked.  Personal lifestyle choices, including:  Daily care of your teeth and gums.  Regular physical activity.  Eating a healthy diet.  Avoiding tobacco and drug use.  Limiting alcohol use.  Practicing safe sex.  Taking low-dose aspirin every day.  Taking vitamin and mineral supplements as recommended by your health care provider. What happens during an annual well check? The services and screenings done by your health care provider during your annual well check will depend on your age,  overall health, lifestyle risk factors, and family history of disease. Counseling  Your health care provider may ask you questions about your:  Alcohol use.  Tobacco use.  Drug use.  Emotional well-being.  Home and relationship well-being.  Sexual activity.  Eating habits.  History of falls.  Memory and ability to understand (cognition).  Work and work Astronomer.  Reproductive health. Screening  You may have the following tests or measurements:  Height, weight, and BMI.  Blood pressure.  Lipid and cholesterol levels. These may be checked every 5 years, or more frequently if you are over 75 years old.  Skin check.  Lung cancer screening. You may have this screening every year starting at age 75 if you have a 30-pack-year history of smoking and currently smoke or have quit within the past 15 years.  Fecal occult blood test (FOBT) of the stool. You may have this test every year starting at age 75.  Flexible sigmoidoscopy or colonoscopy. You may have a sigmoidoscopy every 5 years or a colonoscopy every 10 years starting at age 75.  Hepatitis C blood test.  Hepatitis B blood test.  Sexually transmitted disease (STD) testing.  Diabetes screening. This is done by checking your blood sugar (glucose) after you have not eaten for a while (fasting). You may have this done every 1-3 years.  Bone density scan. This is done to screen for osteoporosis. You may have this done starting at age 75.  Mammogram. This may be done every 1-2 years. Talk to your health care provider about how often you should have regular mammograms. Talk with your health care provider about your test results, treatment options, and if necessary, the need for more tests. Vaccines  Your health care provider may recommend certain vaccines, such as:  Influenza vaccine. This is recommended every year.  Tetanus, diphtheria, and acellular pertussis (Tdap, Td) vaccine. You may need a Td booster every 10  years.  Zoster vaccine. You may need this after age 75.  Pneumococcal 13-valent conjugate (PCV13) vaccine. One dose is recommended after age 14.  Pneumococcal polysaccharide (PPSV23) vaccine. One dose is recommended after age 45. Talk to your health care provider about which screenings and vaccines you need and how often you need them. This information is not intended to replace advice given to you by your health care provider. Make sure you discuss any questions you have with your health care provider. Document Released: 09/13/2015 Document Revised: 05/06/2016 Document Reviewed: 06/18/2015 Elsevier Interactive Patient Education  2017 [place] Prevention in the Home Falls can cause injuries. They can happen to people of all ages. There are many things you can do to make your home safe and to help prevent falls. What can I do on the outside of my home?  Regularly fix the edges of walkways and driveways and fix any cracks.  Remove anything that might make you trip as you walk through a door, such as a raised step or threshold.  Trim any bushes or trees on the path to your home.  Use bright outdoor lighting.  Clear any walking paths of anything that might make someone trip, such as rocks or tools.  Regularly check to see if handrails are loose or broken. Make sure that both sides of any steps have handrails.  Any raised decks and porches should have guardrails on the edges.  Have any leaves, snow, or ice cleared regularly.  Use sand or salt on walking paths during winter.  Clean up any spills in your garage right away. This includes oil or grease spills. What can I do in the bathroom?  Use night lights.  Install grab bars by the toilet and in the tub and shower. Do not use towel bars as grab bars.  Use non-skid mats or decals in the tub or shower.  If you need to sit down in the shower, use a plastic, non-slip stool.  Keep the floor dry. Clean up any water that  spills on the floor as soon as it happens.  Remove soap buildup in the tub or shower regularly.  Attach bath mats securely with double-sided non-slip rug tape.  Do not have throw rugs and other things on the floor that can make you trip. What can I do in the bedroom?  Use night lights.  Make sure that you have a light by your bed that is easy to reach.  Do not use any sheets or blankets that are too big for your bed. They should not hang down onto the floor.  Have a firm chair that has side arms. You can use this for support while you get dressed.  Do not have throw rugs and other things on the floor that can make you trip. What can I do in the kitchen?  Clean up any spills right away.  Avoid walking on wet floors.  Keep items that you use a lot in easy-to-reach places.  If you need to reach something above you, use a strong step stool that has a grab bar.  Keep electrical cords out of the way.  Do not use floor polish or wax that makes floors slippery. If you must use wax, use non-skid floor wax.  Do  not have throw rugs and other things on the floor that can make you trip. What can I do with my stairs?  Do not leave any items on the stairs.  Make sure that there are handrails on both sides of the stairs and use them. Fix handrails that are broken or loose. Make sure that handrails are as long as the stairways.  Check any carpeting to make sure that it is firmly attached to the stairs. Fix any carpet that is loose or worn.  Avoid having throw rugs at the top or bottom of the stairs. If you do have throw rugs, attach them to the floor with carpet tape.  Make sure that you have a light switch at the top of the stairs and the bottom of the stairs. If you do not have them, ask someone to add them for you. What else can I do to help prevent falls?  Wear shoes that:  Do not have high heels.  Have rubber bottoms.  Are comfortable and fit you well.  Are closed at the  toe. Do not wear sandals.  If you use a stepladder:  Make sure that it is fully opened. Do not climb a closed stepladder.  Make sure that both sides of the stepladder are locked into place.  Ask someone to hold it for you, if possible.  Clearly mark and make sure that you can see:  Any grab bars or handrails.  First and last steps.  Where the edge of each step is.  Use tools that help you move around (mobility aids) if they are needed. These include:  Canes.  Walkers.  Scooters.  Crutches.  Turn on the lights when you go into a dark area. Replace any light bulbs as soon as they burn out.  Set up your furniture so you have a clear path. Avoid moving your furniture around.  If any of your floors are uneven, fix them.  If there are any pets around you, be aware of where they are.  Review your medicines with your doctor. Some medicines can make you feel dizzy. This can increase your chance of falling. Ask your doctor what other things that you can do to help prevent falls. This information is not intended to replace advice given to you by your health care provider. Make sure you discuss any questions you have with your health care provider. Document Released: 06/13/2009 Document Revised: 01/23/2016 Document Reviewed: 09/21/2014 Elsevier Interactive Patient Education  2017 [employer].  Tdap Vaccine (Tetanus, Diphtheria and Pertussis): What You Need to Know 1. Why get vaccinated? Tetanus, diphtheria and pertussis are very serious diseases. Tdap vaccine can protect [place] from these diseases. And, Tdap vaccine given to pregnant women can protect newborn babies against pertussis. TETANUS (Lockjaw) is rare in the [place] today. It causes painful muscle tightening and stiffness, usually all over the body.  It can lead to tightening of muscles in the head and neck so you can't open your mouth, swallow, or sometimes even breathe. Tetanus kills about 1 out of 10 people who are  infected even after receiving the best medical care.  DIPHTHERIA is also rare in the [place] today. It can cause a thick coating to form in the back of the throat.  It can lead to breathing problems, heart failure, paralysis, and death.  PERTUSSIS (Whooping Cough) causes severe coughing spells, which can cause difficulty breathing, vomiting and disturbed sleep.  It can also lead to weight loss, incontinence, and  rib fractures. Up to 2 in 100 adolescents and 5 in 100 adults with pertussis are hospitalized or have complications, which could include pneumonia or death.  These diseases are caused by bacteria. Diphtheria and pertussis are spread from person to person through secretions from coughing or sneezing. Tetanus enters the body through cuts, scratches, or wounds. Before vaccines, as many as 200,000 cases of diphtheria, 200,000 cases of pertussis, and hundreds of cases of tetanus, were reported in the [place] each year. Since vaccination began, reports of cases for tetanus and diphtheria have dropped by about 99% and for pertussis by about 80%. 2. Tdap vaccine Tdap vaccine can protect adolescents and adults from tetanus, diphtheria, and pertussis. One dose of Tdap is routinely given at age 72 or 14. People who did not get Tdap at that age should get it as soon as possible. Tdap is especially important for healthcare professionals and anyone having close contact with a baby younger than 12 months. Pregnant women should get a dose of Tdap during every pregnancy, to protect the newborn from pertussis. Infants are most at risk for severe, life-threatening complications from pertussis. Another vaccine, called Td, protects against tetanus and diphtheria, but not pertussis. A Td booster should be given every 10 years. Tdap may be given as one of these boosters if you have never gotten Tdap before. Tdap may also be given after a severe cut or burn to prevent tetanus infection. Your doctor or  the person giving you the vaccine can give you more information. Tdap may safely be given at the same time as other vaccines. 3. Some people should not get this vaccine  A person who has ever had a life-threatening allergic reaction after a previous dose of any diphtheria, tetanus or pertussis containing vaccine, OR has a severe allergy to any part of this vaccine, should not get Tdap vaccine. Tell the person giving the vaccine about any severe allergies.  Anyone who had coma or long repeated seizures within 7 days after a childhood dose of DTP or DTaP, or a previous dose of Tdap, should not get Tdap, unless a cause other than the vaccine was found. They can still get Td.  Talk to your doctor if you: ? have seizures or another nervous system problem, ? had severe pain or swelling after any vaccine containing diphtheria, tetanus or pertussis, ? ever had a condition called Guillain-Barr Syndrome (GBS), ? aren't feeling well on the day the shot is scheduled. 4. Risks With any medicine, including vaccines, there is a chance of side effects. These are usually mild and go away on their own. Serious reactions are also possible but are rare. Most people who get Tdap vaccine do not have any problems with it. Mild problems following Tdap: (Did not interfere with activities)  Pain where the shot was given (about 3 in 4 adolescents or 2 in 3 adults)  Redness or swelling where the shot was given (about 1 person in 5)  Mild fever of at least 100.14F (up to about 1 in 25 adolescents or 1 in 100 adults)  Headache (about 3 or 4 people in 10)  Tiredness (about 1 person in 3 or 4)  Nausea, vomiting, diarrhea, stomach ache (up to 1 in 4 adolescents or 1 in 10 adults)  Chills, sore joints (about 1 person in 10)  Body aches (about 1 person in 3 or 4)  Rash, swollen glands (uncommon)  Moderate problems following Tdap: (Interfered with activities, but did not require medical  attention)  Pain where  the shot was given (up to 1 in 5 or 6)  Redness or swelling where the shot was given (up to about 1 in 16 adolescents or 1 in 12 adults)  Fever over 102F (about 1 in 100 adolescents or 1 in 250 adults)  Headache (about 1 in 7 adolescents or 1 in 10 adults)  Nausea, vomiting, diarrhea, stomach ache (up to 1 or 3 people in 100)  Swelling of the entire arm where the shot was given (up to about 1 in 500).  Severe problems following Tdap: (Unable to perform usual activities; required medical attention)  Swelling, severe pain, bleeding and redness in the arm where the shot was given (rare).  Problems that could happen after any vaccine:  People sometimes faint after a medical procedure, including vaccination. Sitting or lying down for about 15 minutes can help prevent fainting, and injuries caused by a fall. Tell your doctor if you feel dizzy, or have vision changes or ringing in the ears.  Some people get severe pain in the shoulder and have difficulty moving the arm where a shot was given. This happens very rarely.  Any medication can cause a severe allergic reaction. Such reactions from a vaccine are very rare, estimated at fewer than 1 in a million doses, and would happen within a few minutes to a few hours after the vaccination. As with any medicine, there is a very remote chance of a vaccine causing a serious injury or death. The safety of vaccines is always being monitored. For more information, visit: http://floyd.org/ 5. What if there is a serious problem? What should I look for? Look for anything that concerns you, such as signs of a severe allergic reaction, very high fever, or unusual behavior. Signs of a severe allergic reaction can include hives, swelling of the face and throat, difficulty breathing, a fast heartbeat, dizziness, and weakness. These would usually start a few minutes to a few hours after the vaccination. What should I do?  If you think it is a severe  allergic reaction or other emergency that can't wait, call 9-1-1 or get the person to the nearest hospital. Otherwise, call your doctor.  Afterward, the reaction should be reported to the Vaccine Adverse Event Reporting System (VAERS). Your doctor might file this report, or you can do it yourself through the VAERS web site at www.vaers.LAgents.no, or by calling 1-(463)705-0146. ? VAERS does not give medical advice. 6. The National Vaccine Injury Compensation Program The [employer] Vaccine Injury Compensation Program (VICP) is a federal program that was created to compensate people who may have been injured by certain vaccines. Persons who believe they may have been injured by a vaccine can learn about the program and about filing a claim by calling 1-405 611 1786 or visiting the VICP website at SpiritualWord.at. There is a time limit to file a claim for compensation. 7. How can I learn more?  Ask your doctor. He or she can give you the vaccine package insert or suggest other sources of information.  Call your local or state health department.  Contact the Centers for Disease Control and Prevention (CDC): ? Call (707)058-6651 (1-800-CDC-INFO) or ? Visit CDC's website at PicCapture.uy CDC Tdap Vaccine VIS (10/24/13) This information is not intended to replace advice given to you by your health care provider. Make sure you discuss any questions you have with your health care provider. Document Released: 02/16/2012 Document Revised: 05/07/2016 Document Reviewed: 05/07/2016 Elsevier Interactive Patient Education  2017 [employer]  

## 2018-04-07 NOTE — Progress Notes (Signed)
Subjective:   Cynthia Jones is a 77 y.o. female who presents for Medicare Annual (Subsequent) preventive examination.  Review of Systems:   Cardiac Risk Factors include: advanced age (>83men, >65 women);dyslipidemia;hypertension;obesity (BMI >30kg/m2)     Objective:     Vitals: BP 132/83 (BP Location: Left Arm, Patient Position: Sitting)   Pulse 80   Temp 98 F (36.7 C) (Temporal)   Resp 16   Ht 4\' 11"  (1.499 m)   Wt 219 lb 1.6 oz (99.4 kg)   SpO2 98%   BMI 44.25 kg/m   Body mass index is 44.25 kg/m.  Advanced Directives 04/07/2018 02/09/2018 08/16/2017 08/01/2017 05/12/2017 03/05/2017 02/10/2017  Does Patient Have a Medical Advance Directive? Yes Yes Yes No Yes Yes Yes  Type of Advance Directive Living will;Healthcare Power of 02/12/2017 Power of [place];Living will Healthcare Power of [place] - Healthcare Power of [place];Living will - -  Does patient want to make changes to medical advance directive? - - - - - - -  Copy of Healthcare Power of Attorney in Chart? No - copy requested No - copy requested - - - - -  Would patient like information on creating a medical advance directive? - - - No - Patient declined - - -    Tobacco Social History   Tobacco Use  Smoking Status Never Smoker  Smokeless Tobacco Never Used     Counseling given: Not Answered   Clinical Intake:  Pre-visit preparation completed: Yes  Pain : 0-10 Pain Score: 8  Pain Location: Generalized Pain Descriptors / Indicators: Aching Pain Onset: More than a month ago Pain Frequency: Constant     Nutritional Status: BMI > 30  Obese Nutritional Risks: None Diabetes: No CBG done?: No Did pt. bring in CBG monitor from home?: No  How often do you need to have someone help you when you read instructions, pamphlets, or other written materials from your doctor or pharmacy?: 1 - Never What is the last grade level you completed in school?: 10th grade  Interpreter Needed?:  No  Information entered by :: Nicolaos Mitrano,LPN   Past Medical History:  Diagnosis Date  . Acute anxiety 03/06/2015  . Allergy   . Anxiety   . Degenerative joint disease of knee   . Depression   . Fibromyalgia   . Obesity   . Osteopenia   . RAD (reactive airway disease)    Past Surgical History:  Procedure Laterality Date  . ABDOMINAL HYSTERECTOMY    . APPENDECTOMY    . BREAST CYST ASPIRATION Right 04/18/2013   neg  . CHOLECYSTECTOMY    . EYE SURGERY    . JOINT REPLACEMENT  2014   right knee  . PAROTIDECTOMY Left   . TONSILLECTOMY     Family History  Problem Relation Age of Onset  . Heart disease Mother   . Stroke Mother   . Diabetes Mother   . Alzheimer's disease Father   . Parkinson's disease Father   . Cancer Sister    Social History   Socioeconomic History  . Marital status: Widowed    Spouse name: Not on file  . Number of children: Not on file  . Years of education: Not on file  . Highest education level: 10th grade  Occupational History  . Not on file  Social Needs  . Financial resource strain: Not very hard  . Food insecurity:    Worry: Never true    Inability: Never true  . Transportation needs:  Medical: No    Non-medical: No  Tobacco Use  . Smoking status: Never Smoker  . Smokeless tobacco: Never Used  Substance and Sexual Activity  . Alcohol use: No    Alcohol/week: 0.0 standard drinks  . Drug use: No  . Sexual activity: Not on file  Lifestyle  . Physical activity:    Days per week: 0 days    Minutes per session: 0 min  . Stress: Not at all  Relationships  . Social connections:    Talks on phone: More than three times a week    Gets together: More than three times a week    Attends religious service: More than 4 times per year    Active member of club or organization: No    Attends meetings of clubs or organizations: Never    Relationship status: Widowed  Other Topics Concern  . Not on file  Social History Narrative  . Not on  file    Outpatient Encounter Medications as of 04/07/2018  Medication Sig  . ALPRAZolam (XANAX) 0.25 MG tablet Take 1 tablet (0.25 mg total) by mouth 2 (two) times daily as needed for anxiety.  . carvedilol (COREG) 12.5 MG tablet Take 1 tablet (12.5 mg total) by mouth 2 (two) times daily with a meal.  . DULoxetine (CYMBALTA) 60 MG capsule TAKE 1 CAPSULE BY MOUTH EVERY DAY  . esomeprazole (NEXIUM) 40 MG capsule Take 40 mg by mouth daily at 12 noon.  [name] glucose blood (IGLUCOSE TEST STRIPS) test strip Use as instructed - freestyle insulin x strip  . losartan (COZAAR) 100 MG tablet TAKE 1 TABLET (100 MG TOTAL) BY MOUTH DAILY.  [name] nystatin (NYSTATIN) powder Apply topically 4 (four) times daily.  [name] oxyCODONE (OXY IR/ROXICODONE) 5 MG immediate release tablet Take 1 tablet (5 mg total) by mouth every 8 (eight) hours as needed for severe pain.  [name] oxyCODONE (OXY IR/ROXICODONE) 5 MG immediate release tablet Take 1 tablet (5 mg total) by mouth every 8 (eight) hours as needed for severe pain.  Melene Muller ON 04/12/2018] oxyCODONE (OXY IR/ROXICODONE) 5 MG immediate release tablet Take 1 tablet (5 mg total) by mouth every 8 (eight) hours as needed for severe pain. (Patient not taking: Reported on 03/09/2018)   No facility-administered encounter medications on file as of 04/07/2018.     Activities of Daily Living In your present state of health, do you have any difficulty performing the following activities: 04/07/2018  Hearing? Y  Comment difficulty hearing in left hear   Vision? N  Difficulty concentrating or making decisions? N  Walking or climbing stairs? N  Dressing or bathing? N  Doing errands, shopping? N  Preparing Food and eating ? N  Using the Toilet? N  In the past six months, have you accidently leaked urine? N  Do you have problems with loss of bowel control? N  Managing your Medications? N  Managing your Finances? N  Housekeeping or managing your Housekeeping? N  Some recent data might be hidden     Patient Care Team: Steele Sizer, MD as PCP - General (Family Medicine)    Assessment:   This is a routine wellness examination for [place].  Exercise Activities and Dietary recommendations Current Exercise Habits: The patient does not participate in regular exercise at present, Exercise limited by: None identified  Goals    . Increase water intake     Recommend drinking at least 6-8 glasses of water a day  Fall Risk Fall Risk  04/07/2018 03/09/2018 02/09/2018 11/10/2017 10/13/2017  Falls in the past year? Yes No Yes No No  Comment - - - - -  Number falls in past yr: 1 - 1 - -  Injury with Fall? No - No - -  Risk Factor Category  - - - - -  Risk for fall due to : History of fall(s) - - - -  Risk for fall due to: Comment - - - - -  Follow up Falls prevention discussed - - - -  Comment - - - - -   Is the patient's home free of loose throw rugs in walkways, pet beds, electrical cords, etc?   yes      Grab bars in the bathroom? no      Handrails on the stairs?   yes      Adequate lighting?   yes  Timed Get Up and Go performed: Completed in 9 seconds with no use of assistive devices, steady gait. No intervention needed at this time.   Depression Screen PHQ 2/9 Scores 04/07/2018 03/09/2018 02/09/2018 11/10/2017  PHQ - 2 Score 1 0 0 0  PHQ- 9 Score - - - -  Exception Documentation - - - -     Cognitive Function     6CIT Screen 04/07/2018 03/05/2017  What Year? 0 points 0 points  What month? 0 points 0 points  What time? 0 points 0 points  Count back from 20 0 points 0 points  Months in reverse 0 points 0 points  Repeat phrase 0 points 2 points  Total Score 0 2    Immunization History  Administered Date(s) Administered  . Influenza, High Dose Seasonal PF 06/10/2016, 06/22/2017  . Influenza,inj,Quad PF,6+ Mos 06/11/2015  . Pneumococcal Conjugate-13 03/05/2014  . Pneumococcal Polysaccharide-23 01/19/2008  . Td 01/19/2008  . Tdap 04/07/2018    Qualifies for  Shingles Vaccine? Yes, discussed shingrix vaccine   Screening Tests Health Maintenance  Topic Date Due  . FOOT EXAM  06/06/2015  . OPHTHALMOLOGY EXAM  12/18/2015  . INFLUENZA VACCINE  03/31/2018  . HEMOGLOBIN A1C  04/12/2018  . COLONOSCOPY  04/22/2022  . TETANUS/TDAP  04/07/2028  . DEXA SCAN  Completed  . PNA vac Low Risk Adult  Completed    Cancer Screenings: Lung: Low Dose CT Chest recommended if Age 52-80 years, 30 pack-year currently smoking OR have quit w/in 15years. Patient does not qualify. Breast:  Up to date on Mammogram? Yes  11/03/2017 Up to date of Bone Density/Dexa? Yes 03/26/2014 Colorectal: completed 04/22/2012  Additional Screenings:  Hepatitis C Screening:  Not indicated      Plan:    I have personally reviewed and addressed the Medicare Annual Wellness questionnaire and have noted the following in the patient's chart:  A. Medical and social history B. Use of alcohol, tobacco or illicit drugs  C. Current medications and supplements D. Functional ability and status E.  Nutritional status F.  Physical activity G. Advance directives H. List of other physicians I.  Hospitalizations, surgeries, and ER visits in previous 12 months J.  Vitals K. Screenings such as hearing and vision if needed, cognitive and depression L. Referrals and appointments   In addition, I have reviewed and discussed with patient certain preventive protocols, quality metrics, and best practice recommendations. A written personalized care plan for preventive services as well as general preventive health recommendations were provided to patient.   Signed,  Marin Roberts, LPN Nurse Health Advisor  Nurse Notes: due for diabetic foot exam - has cpe scheduled on 05/04/2018 with Dr.Crissman.   Discussed diabetic eye exam- She will schedule appt with Dr.Dingledin and have results faxed over when completed

## 2018-04-14 ENCOUNTER — Encounter: Payer: Medicare Other | Admitting: Family Medicine

## 2018-04-14 ENCOUNTER — Other Ambulatory Visit: Payer: Self-pay | Admitting: Family Medicine

## 2018-04-14 DIAGNOSIS — E118 Type 2 diabetes mellitus with unspecified complications: Secondary | ICD-10-CM

## 2018-04-30 ENCOUNTER — Other Ambulatory Visit: Payer: Self-pay | Admitting: Family Medicine

## 2018-05-03 NOTE — Telephone Encounter (Signed)
fluconazole refill Last Refill:10/20/17 # 1 Last OV: 06/03/17 PCP: Roosvelt Maser Pharmacy: [facility] [place], [place]

## 2018-05-04 ENCOUNTER — Encounter: Payer: Medicare Other | Admitting: Family Medicine

## 2018-05-09 ENCOUNTER — Other Ambulatory Visit: Payer: Self-pay

## 2018-05-09 ENCOUNTER — Encounter: Payer: Self-pay | Admitting: Nurse Practitioner

## 2018-05-09 ENCOUNTER — Ambulatory Visit: Payer: Medicare Other | Attending: Nurse Practitioner | Admitting: Nurse Practitioner

## 2018-05-09 ENCOUNTER — Other Ambulatory Visit: Payer: Self-pay | Admitting: Family Medicine

## 2018-05-09 VITALS — BP 132/71 | HR 71 | Temp 97.7°F | Ht 64.0 in | Wt 221.0 lb

## 2018-05-09 DIAGNOSIS — I10 Essential (primary) hypertension: Secondary | ICD-10-CM | POA: Diagnosis not present

## 2018-05-09 DIAGNOSIS — M79641 Pain in right hand: Secondary | ICD-10-CM | POA: Diagnosis not present

## 2018-05-09 DIAGNOSIS — M545 Low back pain: Secondary | ICD-10-CM | POA: Diagnosis not present

## 2018-05-09 DIAGNOSIS — Z833 Family history of diabetes mellitus: Secondary | ICD-10-CM | POA: Insufficient documentation

## 2018-05-09 DIAGNOSIS — G894 Chronic pain syndrome: Secondary | ICD-10-CM | POA: Diagnosis not present

## 2018-05-09 DIAGNOSIS — M7060 Trochanteric bursitis, unspecified hip: Secondary | ICD-10-CM | POA: Diagnosis not present

## 2018-05-09 DIAGNOSIS — G8929 Other chronic pain: Secondary | ICD-10-CM

## 2018-05-09 DIAGNOSIS — G4700 Insomnia, unspecified: Secondary | ICD-10-CM | POA: Diagnosis not present

## 2018-05-09 DIAGNOSIS — M79642 Pain in left hand: Secondary | ICD-10-CM | POA: Diagnosis not present

## 2018-05-09 DIAGNOSIS — F329 Major depressive disorder, single episode, unspecified: Secondary | ICD-10-CM | POA: Diagnosis not present

## 2018-05-09 DIAGNOSIS — J45909 Unspecified asthma, uncomplicated: Secondary | ICD-10-CM | POA: Insufficient documentation

## 2018-05-09 DIAGNOSIS — E669 Obesity, unspecified: Secondary | ICD-10-CM | POA: Insufficient documentation

## 2018-05-09 DIAGNOSIS — M797 Fibromyalgia: Secondary | ICD-10-CM | POA: Diagnosis not present

## 2018-05-09 DIAGNOSIS — Z79891 Long term (current) use of opiate analgesic: Secondary | ICD-10-CM | POA: Diagnosis not present

## 2018-05-09 DIAGNOSIS — Z79899 Other long term (current) drug therapy: Secondary | ICD-10-CM | POA: Insufficient documentation

## 2018-05-09 DIAGNOSIS — M47896 Other spondylosis, lumbar region: Secondary | ICD-10-CM | POA: Diagnosis not present

## 2018-05-09 DIAGNOSIS — M25551 Pain in right hip: Secondary | ICD-10-CM | POA: Diagnosis not present

## 2018-05-09 DIAGNOSIS — M25552 Pain in left hip: Secondary | ICD-10-CM | POA: Insufficient documentation

## 2018-05-09 DIAGNOSIS — M961 Postlaminectomy syndrome, not elsewhere classified: Secondary | ICD-10-CM | POA: Insufficient documentation

## 2018-05-09 DIAGNOSIS — M47816 Spondylosis without myelopathy or radiculopathy, lumbar region: Secondary | ICD-10-CM | POA: Diagnosis not present

## 2018-05-09 DIAGNOSIS — Z881 Allergy status to other antibiotic agents status: Secondary | ICD-10-CM | POA: Insufficient documentation

## 2018-05-09 DIAGNOSIS — R51 Headache: Secondary | ICD-10-CM | POA: Insufficient documentation

## 2018-05-09 DIAGNOSIS — Z96651 Presence of right artificial knee joint: Secondary | ICD-10-CM | POA: Insufficient documentation

## 2018-05-09 DIAGNOSIS — M79672 Pain in left foot: Secondary | ICD-10-CM | POA: Insufficient documentation

## 2018-05-09 DIAGNOSIS — M25561 Pain in right knee: Secondary | ICD-10-CM | POA: Insufficient documentation

## 2018-05-09 DIAGNOSIS — E7800 Pure hypercholesterolemia, unspecified: Secondary | ICD-10-CM | POA: Diagnosis not present

## 2018-05-09 DIAGNOSIS — M1712 Unilateral primary osteoarthritis, left knee: Secondary | ICD-10-CM | POA: Insufficient documentation

## 2018-05-09 DIAGNOSIS — Z5181 Encounter for therapeutic drug level monitoring: Secondary | ICD-10-CM | POA: Diagnosis not present

## 2018-05-09 DIAGNOSIS — M8580 Other specified disorders of bone density and structure, unspecified site: Secondary | ICD-10-CM | POA: Insufficient documentation

## 2018-05-09 DIAGNOSIS — Z8249 Family history of ischemic heart disease and other diseases of the circulatory system: Secondary | ICD-10-CM | POA: Insufficient documentation

## 2018-05-09 DIAGNOSIS — E1140 Type 2 diabetes mellitus with diabetic neuropathy, unspecified: Secondary | ICD-10-CM | POA: Diagnosis not present

## 2018-05-09 DIAGNOSIS — Z886 Allergy status to analgesic agent status: Secondary | ICD-10-CM | POA: Insufficient documentation

## 2018-05-09 DIAGNOSIS — F419 Anxiety disorder, unspecified: Secondary | ICD-10-CM | POA: Insufficient documentation

## 2018-05-09 DIAGNOSIS — Z6837 Body mass index (BMI) 37.0-37.9, adult: Secondary | ICD-10-CM | POA: Insufficient documentation

## 2018-05-09 MED ORDER — OXYCODONE HCL 5 MG PO TABS: 5.0000 mg | ORAL_TABLET | Freq: Three times a day (TID) | ORAL | 0 refills | Status: DC | PRN

## 2018-05-09 NOTE — Progress Notes (Signed)
Patient's Name: Cynthia Jones  MRN: [id number]  Referring Provider: Guadalupe Maple, MD  DOB: 09/07/1940  PCP: Guadalupe Maple, MD  DOS: 05/09/2018  Note by: Vevelyn Francois NP  Service setting: Ambulatory outpatient  Specialty: Interventional Pain Management  Location: [facility] (AMB) Pain Management Facility    Patient type: Established    Primary Reason(s) for Visit: Encounter for prescription drug management. (Level of risk: moderate)  CC: Back Pain  HPI  Cynthia Jones is a 77 y.o. year old, female patient, who comes today for a medication management evaluation. She has Osteopenia; Depression; Essential hypertension; Diabetes mellitus ([place]); Osteoarthritis; Seasonal allergic rhinitis; Obesity; Intertrigo; Fibromyalgia; Bilateral occipital neuralgia; Greater trochanteric bursitis; Osteoarthritis of knee (Location of Primary Source of Pain) (Left); Sacroiliac joint disease; Lumbar facet syndrome (Bilateral) (L>R); Neuropathy due to secondary diabetes ([place]); S/P TKR Arthroplasty (Right); Folliculitis; Insomnia; Long term prescription opiate use; Long term prescription benzodiazepine use; Chronic pain syndrome; Chronic low back pain (Location of Secondary source of pain) (Bilateral) (L>R); Chronic knee pain (Location of Primary Source of Pain) (Bilateral) (L>R); Cervicogenic headache; Chronic hip pain (Bilateral); Chronic hand pain (Bilateral) (R>L); Chronic foot pain (Left); Hypercholesteremia; Lumbar spondylosis; Advanced care planning/counseling discussion; and Contusion of foot on their problem list. Her primarily concern today is the Back Pain  Pain Assessment: Location: Lower Back Radiating: Pain radiating down both leg and knee down to left foot Onset: More than a month ago Duration: Chronic pain Quality: Burning, Aching, Constant Severity: 6 /10 (subjective, self-reported pain score)  Note: Reported level is compatible with observation.                          Effect on ADL: unable to  stand up to wash dish Timing: Constant Modifying factors: lay down, medications,  BP: 132/71  HR: 71  [name] was last scheduled for an appointment on 02/09/2018 for medication management. During today's appointment we reviewed Cynthia Jones's chronic pain status, as well as her outpatient medication regimen. She admits that her left foot pain is getting worse. She has had xrays which indicated arthritis.  She admits that she was given a boot to wear which is effective.. She admits that it she has stabbing pain. She is also not able to tolerate APAP.She admits that she is not able to take NSAIDs.   The patient  reports that she does not use drugs. Her body mass index is 37.93 kg/m.  Further details on both, my assessment(s), as well as the proposed treatment plan, please see below.  Controlled Substance Pharmacotherapy Assessment REMS (Risk Evaluation and Mitigation Strategy)  Analgesic:Oxycodone IR 5 mg 1 tablet by mouth twice a day MME/day:'15mg'$ /day. Chauncey Fischer, RN  05/09/2018  1:19 PM  Sign at close encounter Nursing Pain Medication Assessment:  Safety precautions to be maintained throughout the outpatient stay will include: orient to surroundings, keep bed in low position, maintain call bell within reach at all times, provide assistance with transfer out of bed and ambulation.  Medication Inspection Compliance: Pill count conducted under aseptic conditions, in front of the patient. Neither the pills nor the bottle was removed from the patient's sight at any time. Once count was completed pills were immediately returned to the patient in their original bottle.  Medication: Oxycodone IR Pill/Patch Count: 15 of 90 pills remain Pill/Patch Appearance: Markings consistent with prescribed medication Bottle Appearance: Standard pharmacy container. Clearly labeled. Filled Date: 8 / 52 / 2019 Last Medication  intake:  Today   Pharmacokinetics: Liberation and absorption (onset of  action): WNL Distribution (time to peak effect): WNL Metabolism and excretion (duration of action): WNL         Pharmacodynamics: Desired effects: Analgesia: Cynthia Jones reports >50% benefit. Functional ability: Patient reports that medication allows her to accomplish basic ADLs Clinically meaningful improvement in function (CMIF): Sustained CMIF goals met Perceived effectiveness: Described as relatively effective, allowing for increase in activities of daily living (ADL) Undesirable effects: Side-effects or Adverse reactions: None reported Monitoring: [place] PMP: Online review of the past 28-monthperiod conducted. Compliant with practice rules and regulations Last UDS on record: Summary  Date Value Ref Range Status  02/09/2018 FINAL  Final    Comment:    ==================================================================== TOXASSURE SELECT 13 (MW) ==================================================================== Test                             Result       Flag       Units Drug Present and Declared for Prescription Verification   Oxycodone                      1276         EXPECTED   ng/mg creat   Noroxycodone                   2447         EXPECTED   ng/mg creat    Sources of oxycodone include scheduled prescription medications.    Noroxycodone is an expected metabolite of oxycodone. Drug Absent but Declared for Prescription Verification   Alprazolam                     Not Detected UNEXPECTED ng/mg creat ==================================================================== Test                      Result    Flag   Units      Ref Range   Creatinine              109              mg/dL      >=20 ==================================================================== Declared Medications:  The flagging and interpretation on this report are based on the  following declared medications.  Unexpected results may arise from  inaccuracies in the declared medications.  **Note: The testing  scope of this panel includes these medications:  Alprazolam (Xanax)  Oxycodone  **Note: The testing scope of this panel does not include following  reported medications:  Carvedilol  Duloxetine  Losartan (Losartan Potassium)  Nystatin  Omeprazole (Nexium)  Polymyxin (PolyTrim)  Trimethoprim (PolyTrim) ==================================================================== For clinical consultation, please call [phone] ====================================================================    UDS interpretation: Compliant          Medication Assessment Form: Reviewed. Patient indicates being compliant with therapy Treatment compliance: Compliant Risk Assessment Profile: Aberrant behavior: See prior evaluations. None observed or detected today Comorbid factors increasing risk of overdose: See prior notes. No additional risks detected today Opioid risk tool (ORT) (Total Score): 0 Personal History of Substance Abuse (SUD-Substance use disorder):  Alcohol: Negative  Illegal Drugs: Negative  Rx Drugs: Negative  ORT Risk Level calculation: Low Risk Risk of substance use disorder (SUD): Low Opioid Risk Tool - 05/09/18 1317      Family History of Substance Abuse   Alcohol  Negative    Illegal  Drugs  Negative    Rx Drugs  Negative      Personal History of Substance Abuse   Alcohol  Negative    Illegal Drugs  Negative    Rx Drugs  Negative      Age   Age between 43-45 years   No      History of Preadolescent Sexual Abuse   History of Preadolescent Sexual Abuse  Negative or Female      Psychological Disease   Psychological Disease  Negative    Depression  Negative      Total Score   Opioid Risk Tool Scoring  0    Opioid Risk Interpretation  Low Risk      ORT Scoring interpretation table:  Score <3 = Low Risk for SUD  Score between 4-7 = Moderate Risk for SUD  Score >8 = High Risk for Opioid Abuse   Risk Mitigation Strategies:  Patient Counseling:  Covered Patient-Prescriber Agreement (PPA): Present and active  Notification to other healthcare providers: Done  Pharmacologic Plan: No change in therapy, at this time.             Laboratory Chemistry  Inflammation Markers (CRP: Acute Phase) (ESR: Chronic Phase) Lab Results  Component Value Date   CRP 1.1 (H) 09/24/2016   ESRSEDRATE 44 (H) 09/24/2016                         Rheumatology Markers No results found for: RF, ANA, LABURIC, URICUR, LYMEIGGIGMAB, LYMEABIGMQN, HLAB27                      Renal Function Markers Lab Results  Component Value Date   BUN 11 10/13/2017   CREATININE 0.75 10/13/2017   BCR 15 10/13/2017   GFRAA 91 10/13/2017   GFRNONAA 79 10/13/2017                             Hepatic Function Markers Lab Results  Component Value Date   AST 20 10/13/2017   ALT 24 10/13/2017   ALBUMIN 4.1 04/12/2017   ALKPHOS 49 04/12/2017                        Electrolytes Lab Results  Component Value Date   NA 142 10/13/2017   K 4.2 10/13/2017   CL 103 10/13/2017   CALCIUM 9.6 10/13/2017   MG 2.1 09/24/2016                        Neuropathy Markers Lab Results  Component Value Date   VITAMINB12 344 09/24/2016   HGBA1C 6.6 04/08/2016                        CNS Tests No results found for: SDES, GRAMSTAIN, CULT, COLORCSF, APPEARCSF, RBCCOUNTCSF, WBCCSF, POLYSCSF, LYMPHSCSF, EOSCSF, PROTEINCSF, GLUCCSF, JCVIRUS, CSFOLI, IGGCSF, IGGALB, IGGIND                      Bone Pathology Markers Lab Results  Component Value Date   25OHVITD1 20 (L) 09/24/2016   25OHVITD2 <1.0 09/24/2016   25OHVITD3 19 09/24/2016                         Coagulation Parameters Lab Results  Component Value Date   PLT 328  04/12/2017                        Cardiovascular Markers Lab Results  Component Value Date   HGB 13.2 04/12/2017   HCT 39.9 04/12/2017                         CA Markers No results found for: CEA, CA125, LABCA2                      Note: Lab results  reviewed.  Recent Diagnostic Imaging Results  [place] AXILLA RIGHT CLINICAL DATA:  77 year old presenting with a possible palpable lump in the right axilla, intermittently tender. Annual mammographic evaluation.  EXAM: DIGITAL DIAGNOSTIC BILATERAL MAMMOGRAM WITH CAD AND TOMO  ULTRASOUND RIGHT AXILLA  COMPARISON:  Mammography 03/26/2014, 03/01/2013 and earlier. No prior right axillary ultrasound.  ACR Breast Density Category b: There are scattered areas of fibroglandular density.  FINDINGS: Standard 2D and tomosynthesis full field CC and MLO views of both breasts were obtained.  No findings suspicious for malignancy in either breast.  Benign dystrophic calcifications associated with a degenerating fibroadenoma in the lower left breast at middle to posterior depth, unchanged dating back to 2010.  Mammographic images were processed with CAD.  On physical exam, there is no palpable mass or lymphadenopathy in the right axilla in the area of patient concern.  Targeted right axillary ultrasound is performed, showing normal fibrofatty tissue in the area of palpable concern. A normal-appearing level 2 lymph node is present behind the pectoralis muscle deep in the area of palpable concern measuring approximate 1.2 cm. No mass or pathologic lymphadenopathy is identified.  IMPRESSION: 1. No mammographic evidence of malignancy involving either breast. 2. No pathologic right axillary lymphadenopathy.  RECOMMENDATION: Screening mammogram in one year.(Code:SM-B-01Y)  I have discussed the findings and recommendations with the patient. Results were also provided in writing at the conclusion of the visit. If applicable, a reminder letter will be sent to the patient regarding the next appointment.  BI-RADS CATEGORY  2: Benign.  Electronically Signed   By: Evangeline Dakin M.D.   On: 11/03/2017 12:04 MM DIAG BREAST TOMO BILATERAL CLINICAL DATA:  76 year old presenting with a possible  palpable lump in the right axilla, intermittently tender. Annual mammographic evaluation.  EXAM: DIGITAL DIAGNOSTIC BILATERAL MAMMOGRAM WITH CAD AND TOMO  ULTRASOUND RIGHT AXILLA  COMPARISON:  Mammography 03/26/2014, 03/01/2013 and earlier. No prior right axillary ultrasound.  ACR Breast Density Category b: There are scattered areas of fibroglandular density.  FINDINGS: Standard 2D and tomosynthesis full field CC and MLO views of both breasts were obtained.  No findings suspicious for malignancy in either breast.  Benign dystrophic calcifications associated with a degenerating fibroadenoma in the lower left breast at middle to posterior depth, unchanged dating back to 2010.  Mammographic images were processed with CAD.  On physical exam, there is no palpable mass or lymphadenopathy in the right axilla in the area of patient concern.  Targeted right axillary ultrasound is performed, showing normal fibrofatty tissue in the area of palpable concern. A normal-appearing level 2 lymph node is present behind the pectoralis muscle deep in the area of palpable concern measuring approximate 1.2 cm. No mass or pathologic lymphadenopathy is identified.  IMPRESSION: 1. No mammographic evidence of malignancy involving either breast. 2. No pathologic right axillary lymphadenopathy.  RECOMMENDATION: Screening mammogram in one year.(Code:SM-B-01Y)  I have discussed the findings and recommendations with  the patient. Results were also provided in writing at the conclusion of the visit. If applicable, a reminder letter will be sent to the patient regarding the next appointment.  BI-RADS CATEGORY  2: Benign.  Electronically Signed   By: Evangeline Dakin M.D.   On: 11/03/2017 12:04  Complexity Note: Imaging results reviewed. Results shared with Ms. Senseney, using Layman's terms.                         Meds   Current Outpatient Medications:  .  ALPRAZolam (XANAX) 0.25 MG  tablet, Take 1 tablet (0.25 mg total) by mouth 2 (two) times daily as needed for anxiety., Disp: 60 tablet, Rfl: 4 .  carvedilol (COREG) 12.5 MG tablet, Take 1 tablet (12.5 mg total) by mouth 2 (two) times daily with a meal., Disp: 180 tablet, Rfl: 4 .  DULoxetine (CYMBALTA) 60 MG capsule, TAKE 1 CAPSULE BY MOUTH EVERY DAY, Disp: 90 capsule, Rfl: 2 .  esomeprazole (NEXIUM) 40 MG capsule, Take 40 mg by mouth daily at 12 noon., Disp: , Rfl:  .  glucose blood (IGLUCOSE TEST STRIPS) test strip, Use as instructed - freestyle insulin x strip, Disp: 100 each, Rfl: 12 .  losartan (COZAAR) 100 MG tablet, TAKE 1 TABLET (100 MG TOTAL) BY MOUTH DAILY., Disp: 90 tablet, Rfl: 2 .  nystatin (MYCOSTATIN/NYSTOP) powder, APPLY TOPICALLY 4 (FOUR) TIMES DAILY., Disp: 60 g, Rfl: 0 .  [START ON 07/13/2018] oxyCODONE (OXY IR/ROXICODONE) 5 MG immediate release tablet, Take 1 tablet (5 mg total) by mouth every 8 (eight) hours as needed for severe pain., Disp: 90 tablet, Rfl: 0 .  [START ON 06/13/2018] oxyCODONE (OXY IR/ROXICODONE) 5 MG immediate release tablet, Take 1 tablet (5 mg total) by mouth every 8 (eight) hours as needed for severe pain., Disp: 90 tablet, Rfl: 0 .  [START ON 05/14/2018] oxyCODONE (OXY IR/ROXICODONE) 5 MG immediate release tablet, Take 1 tablet (5 mg total) by mouth every 8 (eight) hours as needed for severe pain., Disp: 90 tablet, Rfl: 0  ROS  Constitutional: Denies any fever or chills Gastrointestinal: No reported hemesis, hematochezia, vomiting, or acute GI distress Musculoskeletal: Denies any acute onset joint swelling, redness, loss of ROM, or weakness Neurological: No reported episodes of acute onset apraxia, aphasia, dysarthria, agnosia, amnesia, paralysis, loss of coordination, or loss of consciousness  Allergies  Ms. Breeding is allergic to nsaids and erythromycin.  [place]  Drug: Ms. Koy  reports that she does not use drugs. Alcohol:  reports that she does not drink  alcohol. Tobacco:  reports that she has never smoked. She has never used smokeless tobacco. Medical:  has a past medical history of Acute anxiety (03/06/2015), Allergy, Anxiety, Degenerative joint disease of knee, Depression, Fibromyalgia, Obesity, Osteopenia, and RAD (reactive airway disease). Surgical: Ms. Modesitt  has a past surgical history that includes Parotidectomy (Left); Appendectomy; Cholecystectomy; Abdominal hysterectomy; Tonsillectomy; Joint replacement (2014); Eye surgery; and Breast cyst aspiration (Right, 04/18/2013). Family: family history includes Alzheimer's disease in her father; Cancer in her sister; Diabetes in her mother; Heart disease in her mother; Parkinson's disease in her father; Stroke in her mother.  Constitutional Exam  General appearance: alert, cooperative, in no distress and moderately obese Vitals:   05/09/18 1303  BP: 132/71  Pulse: 71  Temp: 97.7 F (36.5 C)  SpO2: 99%  Weight: 221 lb (100.2 kg)  Height: '5\' 4"'$  (1.626 m)  Psych/Mental status: Alert, oriented x 3 (person, place, & time)  Eyes: PERLA Respiratory: No evidence of acute respiratory distress  Lumbar Spine Area Exam  Skin & Axial Inspection: No masses, redness, or swelling Alignment: Symmetrical Functional ROM: Unrestricted ROM       Stability: No instability detected Muscle Tone/Strength: Functionally intact. No obvious neuro-muscular anomalies detected. Sensory (Neurological): Unimpaired Palpation: Complains of area being tender to palpation       Provocative Tests: Hyperextension/rotation test: deferred today       Lumbar quadrant test (Kemp's test): deferred today       Lateral bending test: deferred today       Patrick's Maneuver: deferred today                   FABER test: deferred today                   S-I anterior distraction/compression test: deferred today         S-I lateral compression test: deferred today         S-I Thigh-thrust test: deferred today         S-I  Gaenslen's test: deferred today          Gait & Posture Assessment  Ambulation: Unassisted Gait: Relatively normal for age and body habitus Posture: WNL   Lower Extremity Exam    Side: Right lower extremity  Side: Left lower extremity  Stability: No instability observed          Stability: No instability observed          Skin & Extremity Inspection: Skin color, temperature, and hair growth are WNL. No peripheral edema or cyanosis. No masses, redness, swelling, asymmetry, or associated skin lesions. No contractures.  Skin & Extremity Inspection: Skin color, temperature, and hair growth are WNL. No peripheral edema or cyanosis. No masses, redness, swelling, asymmetry, or associated skin lesions. No contractures.  Functional ROM: Unrestricted ROM                  Functional ROM: Unrestricted ROM                  Muscle Tone/Strength: Functionally intact. No obvious neuro-muscular anomalies detected.  Muscle Tone/Strength: Functionally intact. No obvious neuro-muscular anomalies detected.  Sensory (Neurological): Unimpaired  Sensory (Neurological): Unimpaired  Palpation: No palpable anomalies  Palpation: No palpable anomalies   Assessment  Primary Diagnosis & Pertinent Problem List: The primary encounter diagnosis was Lumbar spondylosis. Diagnoses of Chronic foot pain (Left), Fibromyalgia, and Chronic pain syndrome were also pertinent to this visit.  Status Diagnosis  Persistent Persistent Controlled 1. Lumbar spondylosis   2. Chronic foot pain (Left)   3. Fibromyalgia   4. Chronic pain syndrome     Problems updated and reviewed during this visit: No problems updated. Plan of Care  Pharmacotherapy (Medications Ordered): Meds ordered this encounter  Medications  . oxyCODONE (OXY IR/ROXICODONE) 5 MG immediate release tablet    Sig: Take 1 tablet (5 mg total) by mouth every 8 (eight) hours as needed for severe pain.    Dispense:  90 tablet    Refill:  0    Fill one day early if  pharmacy is closed on scheduled refill date. Do not fill until:07/13/2018 To last until:08/12/2018    Order Specific Question:   Supervising Provider    Answer:   Milinda Pointer [phone]  . oxyCODONE (OXY IR/ROXICODONE) 5 MG immediate release tablet    Sig: Take 1 tablet (5 mg total) by mouth every 8 (eight) hours  as needed for severe pain.    Dispense:  90 tablet    Refill:  0    Fill one day early if pharmacy is closed on scheduled refill date. Do not fill until: 06/13/2018 To last until: 07/13/2018    Order Specific Question:   Supervising Provider    Answer:   Milinda Pointer [phone]  . oxyCODONE (OXY IR/ROXICODONE) 5 MG immediate release tablet    Sig: Take 1 tablet (5 mg total) by mouth every 8 (eight) hours as needed for severe pain.    Dispense:  90 tablet    Refill:  0    Fill one day early if pharmacy is closed on scheduled refill date. Do not fill until: 05/14/2018 To last until:06/13/2018    Order Specific Question:   Supervising Provider    Answer:   Milinda Pointer [phone]   New Prescriptions   No medications on file   Medications administered today: Grayling Congress had no medications administered during this visit. Lab-work, procedure(s), and/or referral(s): No orders of the defined types were placed in this encounter.  Imaging and/or referral(s): None  Interventional therapies: Planned, scheduled, and/or pending:  Not at this time.   Considering:  Diagnostic right Genicular nerve block Possible right Genicular nerve radiofrequencyablation  Diagnosticleft intra-articular knee joint injection Possible series of 5 left intra-articular Hyalgan knee injections Diagnostic left Genicular nerve block Possibleleft Genicular nerve RFA Diagnostic bilateral lumbar facet block Possible bilateral lumbar facet RFA Diagnostic left lumbar epidural steroid injection Diagnostic caudal epidural steroid injection + epidurogram Possible Racz  procedure Diagnostic bilateral cervical facet block Possible bilateral cervical facet RFA Diagnostic right versus left cervical epidural steroid injection Diagnostic bilateral intra-articular shoulder joint injection Diagnostic bilateral suprascapular nerve block Possible bilateral suprascapular nerve RFA   Palliative PRN treatment(s):  PalliativebilateralGenicular nerve block Palliativeleft intra-articular knee joint injection Palliativebilateral lumbar facet block  Palliativeleft lumbar epidural steroid injection  Palliativecaudal epiduralsteroid injection + epidurogram  Palliativebilateral cervical facet block Palliativeright versus left cervical epidural steroid injection  Palliativebilateral intra-articular shoulderjoint injection  Palliativebilateral suprascapularnerve block     Provider-requested follow-up: Return in about 3 months (around 08/08/2018) for MedMgmt with Me Donella Stade Edison Pace).  Future Appointments  Date Time Provider [place]  08/08/2018  1:30 PM Vevelyn Francois, NP [facility] None  04/12/2019  9:30 AM [facility] NURSE HEALTH ADVISOR [facility] [place]   Primary Care Physician: Guadalupe Maple, MD Location: [facility] Management Facility Note by: Vevelyn Francois NP Date: 05/09/2018; Time: 2:49 PM  Pain Score Disclaimer: We use the NRS-11 scale. This is a self-reported, subjective measurement of pain severity with only modest accuracy. It is used primarily to identify changes within a particular patient. It must be understood that outpatient pain scales are significantly less accurate that those used for research, where they can be applied under ideal controlled circumstances with minimal exposure to variables. In reality, the score is likely to be a combination of pain intensity and pain affect, where pain affect describes the degree of emotional arousal or changes in action readiness caused by the sensory experience of pain. Factors such as  social and work situation, setting, emotional state, anxiety levels, expectation, and prior pain experience may influence pain perception and show large inter-individual differences that may also be affected by time variables.  Patient instructions provided during this appointment: Patient Instructions   ____________________________________________________________________________________________  Medication Rules  Applies to: All patients receiving prescriptions (written or electronic).  Pharmacy of record: Pharmacy where electronic prescriptions will  be sent. If written prescriptions are taken to a different pharmacy, please inform the nursing staff. The pharmacy listed in the electronic medical record should be the one where you would like electronic prescriptions to be sent.  Prescription refills: Only during scheduled appointments. Applies to both, written and electronic prescriptions.  NOTE: The following applies primarily to controlled substances (Opioid* Pain Medications).   Patient's responsibilities: 1. Pain Pills: Bring all pain pills to every appointment (except for procedure appointments). 2. Pill Bottles: Bring pills in original pharmacy bottle. Always bring newest bottle. Bring bottle, even if empty. 3. Medication refills: You are responsible for knowing and keeping track of what medications you need refilled. The day before your appointment, write a list of all prescriptions that need to be refilled. Bring that list to your appointment and give it to the admitting nurse. Prescriptions will be written only during appointments. If you forget a medication, it will not be "Called in", "Faxed", or "electronically sent". You will need to get another appointment to get these prescribed. 4. Prescription Accuracy: You are responsible for carefully inspecting your prescriptions before leaving our office. Have the discharge nurse carefully go over each prescription with you, before taking  them home. Make sure that your name is accurately spelled, that your address is correct. Check the name and dose of your medication to make sure it is accurate. Check the number of pills, and the written instructions to make sure they are clear and accurate. Make sure that you are given enough medication to last until your next medication refill appointment. 5. Taking Medication: Take medication as prescribed. Never take more pills than instructed. Never take medication more frequently than prescribed. Taking less pills or less frequently is permitted and encouraged, when it comes to controlled substances (written prescriptions).  6. Inform other Doctors: Always inform, all of your healthcare providers, of all the medications you take. 7. Pain Medication from other Providers: You are not allowed to accept any additional pain medication from any other Doctor or Healthcare provider. There are two exceptions to this rule. (see below) In the event that you require additional pain medication, you are responsible for notifying us, as stated below. 8. Medication Agreement: You are responsible for carefully reading and following our Medication Agreement. This must be signed before receiving any prescriptions from our practice. Safely store a copy of your signed Agreement. Violations to the Agreement will result in no further prescriptions. (Additional copies of our Medication Agreement are available upon request.) 9. Laws, Rules, & Regulations: All patients are expected to follow all Federal and [employer], [employer], Rules, [employer]. Ignorance of the Laws does not constitute a valid excuse. The use of any illegal substances is prohibited. 10. Adopted CDC guidelines & recommendations: Target dosing levels will be at or below 60 MME/day. Use of benzodiazepines** is not recommended.  Exceptions: There are only two exceptions to the rule of not receiving pain medications from other Healthcare  Providers. 1. Exception #1 (Emergencies): In the event of an emergency (i.e.: accident requiring emergency care), you are allowed to receive additional pain medication. However, you are responsible for: As soon as you are able, call our office [phone], at any time of the day or night, and leave a message stating your name, the date and nature of the emergency, and the name and dose of the medication prescribed. In the event that your call is answered by a member of our staff, make sure to document and save the date,  time, and the name of the person that took your information.  2. Exception #2 (Planned Surgery): In the event that you are scheduled by another doctor or dentist to have any type of surgery or procedure, you are allowed (for a period no longer than 30 days), to receive additional pain medication, for the acute post-op pain. However, in this case, you are responsible for picking up a copy of our "Post-op Pain Management for Surgeons" handout, and giving it to your surgeon or dentist. This document is available at our office, and does not require an appointment to obtain it. Simply go to our office during business hours (Monday-Thursday from 8:00 AM to 4:00 PM) (Friday 8:00 AM to 12:00 Noon) or if you have a scheduled appointment with [place], prior to your surgery, and ask for it by name. In addition, you will need to provide [place] with your name, name of your surgeon, type of surgery, and date of procedure or surgery.  *Opioid medications include: morphine, codeine, oxycodone, oxymorphone, hydrocodone, hydromorphone, meperidine, tramadol, tapentadol, buprenorphine, fentanyl, methadone. **Benzodiazepine medications include: diazepam (Valium), alprazolam (Xanax), clonazepam (Klonopine), lorazepam (Ativan), clorazepate (Tranxene), chlordiazepoxide (Librium), estazolam (Prosom), oxazepam (Serax), temazepam (Restoril), triazolam (Halcion) (Last updated:  10/28/2017) ____________________________________________________________________________________________   BMI Assessment: Estimated body mass index is 37.93 kg/m as calculated from the following:   Height as of this encounter: '5\' 4"'$  (1.626 m).   Weight as of this encounter: 221 lb (100.2 kg).  BMI interpretation table: BMI level Category Range association with higher incidence of chronic pain  <18 kg/m2 Underweight   18.5-24.9 kg/m2 Ideal body weight   25-29.9 kg/m2 Overweight Increased incidence by 20%  30-34.9 kg/m2 Obese (Class I) Increased incidence by 68%  35-39.9 kg/m2 Severe obesity (Class II) Increased incidence by 136%  >40 kg/m2 Extreme obesity (Class III) Increased incidence by 254%   Patient's current BMI Ideal Body weight  Body mass index is 37.93 kg/m. Ideal body weight: 54.7 kg (120 lb 9.5 oz) Adjusted ideal body weight: 72.9 kg (160 lb 12.1 oz)   BMI Readings from Last 4 Encounters:  05/09/18 37.93 kg/m  04/07/18 44.25 kg/m  03/09/18 43.36 kg/m  02/09/18 43.36 kg/m   Wt Readings from Last 4 Encounters:  05/09/18 221 lb (100.2 kg)  04/07/18 219 lb 1.6 oz (99.4 kg)  02/09/18 222 lb (100.7 kg)  11/10/17 222 lb (100.7 kg)

## 2018-05-09 NOTE — Patient Instructions (Addendum)
____________________________________________________________________________________________  Medication Rules  Applies to: All patients receiving prescriptions (written or electronic).  Pharmacy of record: Pharmacy where electronic prescriptions will be sent. If written prescriptions are taken to a different pharmacy, please inform the nursing staff. The pharmacy listed in the electronic medical record should be the one where you would like electronic prescriptions to be sent.  Prescription refills: Only during scheduled appointments. Applies to both, written and electronic prescriptions.  NOTE: The following applies primarily to controlled substances (Opioid* Pain Medications).   Patient's responsibilities: 1. Pain Pills: Bring all pain pills to every appointment (except for procedure appointments). 2. Pill Bottles: Bring pills in original pharmacy bottle. Always bring newest bottle. Bring bottle, even if empty. 3. Medication refills: You are responsible for knowing and keeping track of what medications you need refilled. The day before your appointment, write a list of all prescriptions that need to be refilled. Bring that list to your appointment and give it to the admitting nurse. Prescriptions will be written only during appointments. If you forget a medication, it will not be "Called in", "Faxed", or "electronically sent". You will need to get another appointment to get these prescribed. 4. Prescription Accuracy: You are responsible for carefully inspecting your prescriptions before leaving our office. Have the discharge nurse carefully go over each prescription with you, before taking them home. Make sure that your name is accurately spelled, that your address is correct. Check the name and dose of your medication to make sure it is accurate. Check the number of pills, and the written instructions to make sure they are clear and accurate. Make sure that you are given enough medication to last  until your next medication refill appointment. 5. Taking Medication: Take medication as prescribed. Never take more pills than instructed. Never take medication more frequently than prescribed. Taking less pills or less frequently is permitted and encouraged, when it comes to controlled substances (written prescriptions).  6. Inform other Doctors: Always inform, all of your healthcare providers, of all the medications you take. 7. Pain Medication from other Providers: You are not allowed to accept any additional pain medication from any other Doctor or Healthcare provider. There are two exceptions to this rule. (see below) In the event that you require additional pain medication, you are responsible for notifying us, as stated below. 8. Medication Agreement: You are responsible for carefully reading and following our Medication Agreement. This must be signed before receiving any prescriptions from our practice. Safely store a copy of your signed Agreement. Violations to the Agreement will result in no further prescriptions. (Additional copies of our Medication Agreement are available upon request.) 9. Laws, Rules, & Regulations: All patients are expected to follow all Federal and State Laws, Statutes, Rules, & Regulations. Ignorance of the Laws does not constitute a valid excuse. The use of any illegal substances is prohibited. 10. Adopted CDC guidelines & recommendations: Target dosing levels will be at or below 60 MME/day. Use of benzodiazepines** is not recommended.  Exceptions: There are only two exceptions to the rule of not receiving pain medications from other Healthcare Providers. 1. Exception #1 (Emergencies): In the event of an emergency (i.e.: accident requiring emergency care), you are allowed to receive additional pain medication. However, you are responsible for: As soon as you are able, call our office [phone], at any time of the day or night, and leave a message stating your name, the  date and nature of the emergency, and the name and dose of the medication   prescribed. In the event that your call is answered by a member of our staff, make sure to document and save the date, time, and the name of the person that took your information.  2. Exception #2 (Planned Surgery): In the event that you are scheduled by another doctor or dentist to have any type of surgery or procedure, you are allowed (for a period no longer than 30 days), to receive additional pain medication, for the acute post-op pain. However, in this case, you are responsible for picking up a copy of our "Post-op Pain Management for Surgeons" handout, and giving it to your surgeon or dentist. This document is available at our office, and does not require an appointment to obtain it. Simply go to our office during business hours (Monday-Thursday from 8:00 AM to 4:00 PM) (Friday 8:00 AM to 12:00 Noon) or if you have a scheduled appointment with [place], prior to your surgery, and ask for it by name. In addition, you will need to provide [place] with your name, name of your surgeon, type of surgery, and date of procedure or surgery.  *Opioid medications include: morphine, codeine, oxycodone, oxymorphone, hydrocodone, hydromorphone, meperidine, tramadol, tapentadol, buprenorphine, fentanyl, methadone. **Benzodiazepine medications include: diazepam (Valium), alprazolam (Xanax), clonazepam (Klonopine), lorazepam (Ativan), clorazepate (Tranxene), chlordiazepoxide (Librium), estazolam (Prosom), oxazepam (Serax), temazepam (Restoril), triazolam (Halcion) (Last updated: 10/28/2017) ____________________________________________________________________________________________   BMI Assessment: Estimated body mass index is 37.93 kg/m as calculated from the following:   Height as of this encounter: 5\' 4"  (1.626 m).   Weight as of this encounter: 221 lb (100.2 kg).  BMI interpretation table: BMI level Category Range association with higher  incidence of chronic pain  <18 kg/m2 Underweight   18.5-24.9 kg/m2 Ideal body weight   25-29.9 kg/m2 Overweight Increased incidence by 20%  30-34.9 kg/m2 Obese (Class I) Increased incidence by 68%  35-39.9 kg/m2 Severe obesity (Class II) Increased incidence by 136%  >40 kg/m2 Extreme obesity (Class III) Increased incidence by 254%   Patient's current BMI Ideal Body weight  Body mass index is 37.93 kg/m. Ideal body weight: 54.7 kg (120 lb 9.5 oz) Adjusted ideal body weight: 72.9 kg (160 lb 12.1 oz)   BMI Readings from Last 4 Encounters:  05/09/18 37.93 kg/m  04/07/18 44.25 kg/m  03/09/18 43.36 kg/m  02/09/18 43.36 kg/m   Wt Readings from Last 4 Encounters:  05/09/18 221 lb (100.2 kg)  04/07/18 219 lb 1.6 oz (99.4 kg)  02/09/18 222 lb (100.7 kg)  11/10/17 222 lb (100.7 kg)

## 2018-05-09 NOTE — Progress Notes (Signed)
Nursing Pain Medication Assessment:  Safety precautions to be maintained throughout the outpatient stay will include: orient to surroundings, keep bed in low position, maintain call bell within reach at all times, provide assistance with transfer out of bed and ambulation.  Medication Inspection Compliance: Pill count conducted under aseptic conditions, in front of the patient. Neither the pills nor the bottle was removed from the patient's sight at any time. Once count was completed pills were immediately returned to the patient in their original bottle.  Medication: Oxycodone IR Pill/Patch Count: 15 of 90 pills remain Pill/Patch Appearance: Markings consistent with prescribed medication Bottle Appearance: Standard pharmacy container. Clearly labeled. Filled Date: 8 / 15 / 2019 Last Medication intake:  Today

## 2018-05-09 NOTE — Telephone Encounter (Signed)
Medication expired  Carvedilol refill Last refill:04/12/17 #180/4 refill Last OV:10/13/17 PVX:[id number] Pharmacy: [facility] [address] [place] - [address] [phone] (Phone) [phone] (Fax)

## 2018-05-11 ENCOUNTER — Telehealth: Payer: Self-pay | Admitting: Family Medicine

## 2018-05-11 NOTE — Telephone Encounter (Signed)
Returned pt call, she's been having some reflux/gastritis sxs and has doubled up on her nexium with good improvement. States she will make an appt if things don't resolve or get worse again  Copied from CRM [phone]. Topic: General - Other >> May 09, 2018  3:35 PM Gerrianne Scale wrote: Reason for CRM: pt calling wanting Dr Dossie Arbour to give her a call back about her ulcers she is having problems with it and wouldn't say anything else >> May 09, 2018  3:42 PM Sharol Given wrote: Since Dr Dossie Arbour is out, does she need to come in and be seen? >> May 09, 2018  3:50 PM Richarda Overlie, CMA wrote: Called and spoke with patient. Advised that [name], MD is out of office currently. Patient did not want to give more information but did requested that Mrs. Maurice March, PA-C call her if possible. She  Stated it is a reoccuring issue and she just need a little advice. Please advise.

## 2018-05-20 ENCOUNTER — Other Ambulatory Visit: Payer: Self-pay | Admitting: Family Medicine

## 2018-05-20 DIAGNOSIS — E118 Type 2 diabetes mellitus with unspecified complications: Secondary | ICD-10-CM

## 2018-06-14 ENCOUNTER — Ambulatory Visit (INDEPENDENT_AMBULATORY_CARE_PROVIDER_SITE_OTHER): Payer: Medicare Other | Admitting: Family Medicine

## 2018-06-14 ENCOUNTER — Encounter: Payer: Self-pay | Admitting: Family Medicine

## 2018-06-14 VITALS — BP 129/58 | HR 75 | Temp 97.0°F | Ht 60.0 in | Wt 218.4 lb

## 2018-06-14 DIAGNOSIS — E1340 Other specified diabetes mellitus with diabetic neuropathy, unspecified: Secondary | ICD-10-CM | POA: Diagnosis not present

## 2018-06-14 DIAGNOSIS — Z7189 Other specified counseling: Secondary | ICD-10-CM | POA: Diagnosis not present

## 2018-06-14 DIAGNOSIS — Z23 Encounter for immunization: Secondary | ICD-10-CM

## 2018-06-14 DIAGNOSIS — E7800 Pure hypercholesterolemia, unspecified: Secondary | ICD-10-CM

## 2018-06-14 DIAGNOSIS — E119 Type 2 diabetes mellitus without complications: Secondary | ICD-10-CM | POA: Diagnosis not present

## 2018-06-14 DIAGNOSIS — I10 Essential (primary) hypertension: Secondary | ICD-10-CM | POA: Diagnosis not present

## 2018-06-14 DIAGNOSIS — Z0000 Encounter for general adult medical examination without abnormal findings: Secondary | ICD-10-CM

## 2018-06-14 DIAGNOSIS — F3342 Major depressive disorder, recurrent, in full remission: Secondary | ICD-10-CM

## 2018-06-14 LAB — URINALYSIS, ROUTINE W REFLEX MICROSCOPIC
BILIRUBIN UA: NEGATIVE
Glucose, UA: NEGATIVE
Ketones, UA: NEGATIVE
Leukocytes, UA: NEGATIVE
NITRITE UA: NEGATIVE
PH UA: 5 (ref 5.0–7.5)
Protein, UA: NEGATIVE
RBC, UA: NEGATIVE
Specific Gravity, UA: 1.015 (ref 1.005–1.030)
Urobilinogen, Ur: 0.2 mg/dL (ref 0.2–1.0)

## 2018-06-14 LAB — BAYER DCA HB A1C WAIVED: HB A1C: 6.6 % (ref <7.0)

## 2018-06-14 MED ORDER — CARVEDILOL 6.25 MG PO TABS: 6.2500 mg | ORAL_TABLET | Freq: Two times a day (BID) | ORAL | 4 refills | Status: DC

## 2018-06-14 MED ORDER — DULOXETINE HCL 60 MG PO CPEP: 60.0000 mg | ORAL_CAPSULE | Freq: Every day | ORAL | 4 refills | Status: DC

## 2018-06-14 MED ORDER — LOSARTAN POTASSIUM 100 MG PO TABS: 100.0000 mg | ORAL_TABLET | Freq: Every day | ORAL | 4 refills | Status: DC

## 2018-06-14 NOTE — Patient Instructions (Signed)

## 2018-06-14 NOTE — Assessment & Plan Note (Signed)
The current medical regimen is effective;  continue present plan and medications.  

## 2018-06-14 NOTE — Progress Notes (Signed)
BP (!) 129/58   Pulse 75   Temp (!) 97 F (36.1 C) (Oral)   Ht 5' (1.524 m)   Wt 218 lb 6.4 oz (99.1 kg)   SpO2 99%   BMI 42.65 kg/m    Subjective:    Patient ID: Cynthia Jones, female    DOB: Sep 22, 1940, 77 y.o.   MRN: [id number]  HPI: Cynthia Jones is a 77 y.o. female  Chief Complaint  Patient presents with  . Depression  . Diabetes  . Hyperlipidemia  . Hypertension  Annual exam Patient all in all doing well no complaints is going to the pain clinic for oxycodone which is helping a great deal. May take Xanax 0.251 a week or so.  Has rare usage. Blood pressure doing well with losartan 100 mg carvedilol 12.5 twice a day is taking 1/2 tablet twice a day and wants to cut back her medicine as she is getting too much. Nystatin is working well for intertrigo and wants to continue that on a as needed basis. Taking duloxetine for nerves and pain which may or may not be helping will continue.   Relevant past medical, surgical, family and social history reviewed and updated as indicated. Interim medical history since our last visit reviewed. Allergies and medications reviewed and updated.  Review of Systems  Constitutional: Negative.   HENT: Negative.   Eyes: Negative.   Respiratory: Negative.   Cardiovascular: Negative.   Gastrointestinal: Negative.   Endocrine: Negative.   Genitourinary: Negative.   Musculoskeletal: Negative.        Chronic back pain managed by pain clinic  Skin: Negative.   Allergic/Immunologic: Negative.   Neurological: Negative.   Hematological: Negative.   Psychiatric/Behavioral: Negative.     Per HPI unless specifically indicated above     Objective:    BP (!) 129/58   Pulse 75   Temp (!) 97 F (36.1 C) (Oral)   Ht 5' (1.524 m)   Wt 218 lb 6.4 oz (99.1 kg)   SpO2 99%   BMI 42.65 kg/m   Wt Readings from Last 3 Encounters:  06/14/18 218 lb 6.4 oz (99.1 kg)  05/09/18 221 lb (100.2 kg)  04/07/18 219 lb 1.6 oz (99.4 kg)      Physical Exam  Constitutional: She is oriented to person, place, and time. She appears well-developed and well-nourished.  HENT:  Head: Normocephalic and atraumatic.  Right Ear: External ear normal.  Left Ear: External ear normal.  Nose: Nose normal.  Mouth/Throat: Oropharynx is clear and moist.  Eyes: Pupils are equal, round, and reactive to light. Conjunctivae and EOM are normal.  Neck: Normal range of motion. Neck supple. Carotid bruit is not present.  Cardiovascular: Normal rate, regular rhythm and normal heart sounds.  No murmur heard. Pulmonary/Chest: Effort normal and breath sounds normal. She exhibits no mass. Right breast exhibits no mass, no skin change and no tenderness. Left breast exhibits no mass, no skin change and no tenderness. Breasts are symmetrical.  Abdominal: Soft. Bowel sounds are normal. There is no hepatosplenomegaly.  Musculoskeletal: Normal range of motion.  Neurological: She is alert and oriented to person, place, and time.  Skin: No rash noted.  Psychiatric: She has a normal mood and affect. Her behavior is normal. Judgment and thought content normal.    Results for orders placed or performed in visit on 02/09/18  ToxASSURE Select 13 (MW), Urine  Result Value Ref Range   Summary FINAL  Assessment & Plan:   Problem List Items Addressed This Visit      Cardiovascular and Mediastinum   Essential hypertension    The current medical regimen is effective;  continue present plan and medications.       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       Relevant Medications   losartan (COZAAR) 100 MG tablet    Other Relevant Orders   TSH     Other   Depression    The current medical regimen is effective;  continue present plan and medications.       Relevant Medications   DULoxetine (CYMBALTA) 60 MG capsule   Hypercholesteremia    The current medical regimen is effective;  continue present plan and medications.       Relevant Medications   losartan (COZAAR) 100 MG tablet   carvedilol (COREG) 6.25 MG tablet   Other Relevant Orders   Lipid panel   CBC with Differential/Platelet   TSH   Advanced care planning/counseling discussion    A voluntary discussion about advanced care planning including explanation and discussion of advanced directives was extentively discussed with the patient.  Explained about the healthcare proxy and living will was reviewed and packet with forms with expiration of how to fill them out was given.  Time spent: Encounter 16+ min individuals present: Patient       Other Visit Diagnoses    Diabetes mellitus without complication (HCC)    -  Primary   Relevant Medications   losartan (COZAAR) 100 MG tablet   Other Relevant Orders   Bayer DCA Hb A1c Waived   Comprehensive metabolic panel   CBC with Differential/Platelet   TSH   Need for influenza vaccination       Relevant Orders   Flu vaccine HIGH DOSE PF (Completed)       Follow up plan: Return in about 6 months (around 12/14/2018) for Hemoglobin A1c, BMP,  Lipids, ALT, AST.

## 2018-06-14 NOTE — Assessment & Plan Note (Signed)
A voluntary discussion about advanced care planning including explanation and discussion of advanced directives was extentively discussed with the patient.  Explained about the healthcare proxy and living will was reviewed and packet with forms with expiration of how to fill them out was given.  Time spent: Encounter 16+ min individuals present: Patient 

## 2018-06-15 LAB — CBC WITH DIFFERENTIAL/PLATELET
BASOS: 1 %
Basophils Absolute: 0.1 10*3/uL (ref 0.0–0.2)
EOS (ABSOLUTE): 0.1 10*3/uL (ref 0.0–0.4)
EOS: 2 %
HEMATOCRIT: 40.5 % (ref 34.0–46.6)
HEMOGLOBIN: 13.3 g/dL (ref 11.1–15.9)
Immature Grans (Abs): 0 10*3/uL (ref 0.0–0.1)
Immature Granulocytes: 0 %
Lymphocytes Absolute: 1.9 10*3/uL (ref 0.7–3.1)
Lymphs: 25 %
MCH: 30.5 pg (ref 26.6–33.0)
MCHC: 32.8 g/dL (ref 31.5–35.7)
MCV: 93 fL (ref 79–97)
MONOCYTES: 8 %
MONOS ABS: 0.6 10*3/uL (ref 0.1–0.9)
NEUTROS PCT: 64 %
Neutrophils Absolute: 4.8 10*3/uL (ref 1.4–7.0)
Platelets: 343 10*3/uL (ref 150–450)
RBC: 4.36 x10E6/uL (ref 3.77–5.28)
RDW: 12.5 % (ref 12.3–15.4)
WBC: 7.5 10*3/uL (ref 3.4–10.8)

## 2018-06-15 LAB — COMPREHENSIVE METABOLIC PANEL
ALBUMIN: 4.3 g/dL (ref 3.5–4.8)
ALT: 13 IU/L (ref 0–32)
AST: 11 IU/L (ref 0–40)
Albumin/Globulin Ratio: 1.5 (ref 1.2–2.2)
Alkaline Phosphatase: 51 IU/L (ref 39–117)
BUN/Creatinine Ratio: 18 (ref 12–28)
BUN: 14 mg/dL (ref 8–27)
Bilirubin Total: 0.3 mg/dL (ref 0.0–1.2)
CHLORIDE: 101 mmol/L (ref 96–106)
CO2: 19 mmol/L — AB (ref 20–29)
Calcium: 9.7 mg/dL (ref 8.7–10.3)
Creatinine, Ser: 0.76 mg/dL (ref 0.57–1.00)
GFR calc Af Amer: 89 mL/min/{1.73_m2} (ref >59)
GFR calc non Af Amer: 77 mL/min/{1.73_m2} (ref >59)
GLUCOSE: 169 mg/dL — AB (ref 65–99)
Globulin, Total: 2.9 g/dL (ref 1.5–4.5)
POTASSIUM: 4.5 mmol/L (ref 3.5–5.2)
Sodium: 140 mmol/L (ref 134–144)
TOTAL PROTEIN: 7.2 g/dL (ref 6.0–8.5)

## 2018-06-15 LAB — LIPID PANEL
CHOLESTEROL TOTAL: 195 mg/dL (ref 100–199)
Chol/HDL Ratio: 2.9 ratio (ref 0.0–4.4)
HDL: 68 mg/dL (ref >39)
LDL Calculated: 103 mg/dL — ABNORMAL HIGH (ref 0–99)
Triglycerides: 121 mg/dL (ref 0–149)
VLDL CHOLESTEROL CAL: 24 mg/dL (ref 5–40)

## 2018-06-15 LAB — TSH: TSH: 6.67 u[IU]/mL — ABNORMAL HIGH (ref 0.450–4.500)

## 2018-06-16 ENCOUNTER — Telehealth: Payer: Self-pay | Admitting: Family Medicine

## 2018-06-16 DIAGNOSIS — R7989 Other specified abnormal findings of blood chemistry: Secondary | ICD-10-CM

## 2018-06-16 NOTE — Telephone Encounter (Signed)
Phone call Discussed with patient elevated TSH has been elevated in the past will recheck in a month or 2 and if still elevated will consider medications

## 2018-06-16 NOTE — Telephone Encounter (Signed)
-----   Message from Richarda Overlie, [place] sent at 06/16/2018 12:43 PM EDT ----- Patient was transferred to provider for telephone conversation.

## 2018-06-28 DIAGNOSIS — E119 Type 2 diabetes mellitus without complications: Secondary | ICD-10-CM | POA: Diagnosis not present

## 2018-06-28 LAB — HM DIABETES EYE EXAM

## 2018-08-04 ENCOUNTER — Encounter: Payer: Self-pay | Admitting: Family Medicine

## 2018-08-08 ENCOUNTER — Ambulatory Visit: Payer: Medicare Other | Attending: Nurse Practitioner | Admitting: Nurse Practitioner

## 2018-08-08 ENCOUNTER — Other Ambulatory Visit: Payer: Self-pay

## 2018-08-08 ENCOUNTER — Encounter: Payer: Self-pay | Admitting: Nurse Practitioner

## 2018-08-08 VITALS — BP 139/49 | HR 88 | Temp 98.0°F | Resp 18 | Ht 60.0 in | Wt 214.7 lb

## 2018-08-08 DIAGNOSIS — M47816 Spondylosis without myelopathy or radiculopathy, lumbar region: Secondary | ICD-10-CM

## 2018-08-08 DIAGNOSIS — M899 Disorder of bone, unspecified: Secondary | ICD-10-CM

## 2018-08-08 DIAGNOSIS — G894 Chronic pain syndrome: Secondary | ICD-10-CM | POA: Diagnosis not present

## 2018-08-08 DIAGNOSIS — Z79899 Other long term (current) drug therapy: Secondary | ICD-10-CM | POA: Insufficient documentation

## 2018-08-08 DIAGNOSIS — Z789 Other specified health status: Secondary | ICD-10-CM | POA: Diagnosis not present

## 2018-08-08 MED ORDER — NONFORMULARY OR COMPOUNDED ITEM: 2 refills | Status: AC

## 2018-08-08 MED ORDER — OXYCODONE HCL 5 MG PO TABS: 5.0000 mg | ORAL_TABLET | Freq: Three times a day (TID) | ORAL | 0 refills | Status: DC | PRN

## 2018-08-08 NOTE — Patient Instructions (Addendum)
____________________________________________________________________________________________  Medication Rules  Purpose: To inform patients, and their family members, of our rules and regulations.  Applies to: All patients receiving prescriptions (written or electronic).  Pharmacy of record: Pharmacy where electronic prescriptions will be sent. If written prescriptions are taken to a different pharmacy, please inform the nursing staff. The pharmacy listed in the electronic medical record should be the one where you would like electronic prescriptions to be sent.  Electronic prescriptions: In compliance with the [place] Strengthen Opioid Misuse Prevention (STOP) Act of 2017 (Session Law 2017-74/H243), effective August 31, 2018, all controlled substances must be electronically prescribed. Calling prescriptions to the pharmacy will cease to exist.  Prescription refills: Only during scheduled appointments. Applies to all prescriptions.  NOTE: The following applies primarily to controlled substances (Opioid* Pain Medications).   Patient's responsibilities: 1. Pain Pills: Bring all pain pills to every appointment (except for procedure appointments). 2. Pill Bottles: Bring pills in original pharmacy bottle. Always bring the newest bottle. Bring bottle, even if empty. 3. Medication refills: You are responsible for knowing and keeping track of what medications you take and those you need refilled. The day before your appointment: write a list of all prescriptions that need to be refilled. The day of the appointment: give the list to the admitting nurse. Prescriptions will be written only during appointments. If you forget a medication: it will not be "Called in", "Faxed", or "electronically sent". You will need to get another appointment to get these prescribed. No early refills. Do not call asking to have your prescription filled early. 4. Prescription Accuracy: You are responsible for  carefully inspecting your prescriptions before leaving our office. Have the discharge nurse carefully go over each prescription with you, before taking them home. Make sure that your name is accurately spelled, that your address is correct. Check the name and dose of your medication to make sure it is accurate. Check the number of pills, and the written instructions to make sure they are clear and accurate. Make sure that you are given enough medication to last until your next medication refill appointment. 5. Taking Medication: Take medication as prescribed. When it comes to controlled substances, taking less pills or less frequently than prescribed is permitted and encouraged. Never take more pills than instructed. Never take medication more frequently than prescribed.  6. Inform other Doctors: Always inform, all of your healthcare providers, of all the medications you take. 7. Pain Medication from other Providers: You are not allowed to accept any additional pain medication from any other Doctor or Healthcare provider. There are two exceptions to this rule. (see below) In the event that you require additional pain medication, you are responsible for notifying us, as stated below. 8. Medication Agreement: You are responsible for carefully reading and following our Medication Agreement. This must be signed before receiving any prescriptions from our practice. Safely store a copy of your signed Agreement. Violations to the Agreement will result in no further prescriptions. (Additional copies of our Medication Agreement are available upon request.) 9. Laws, Rules, & Regulations: All patients are expected to follow all Federal and State Laws, Statutes, Rules, & Regulations. Ignorance of the Laws does not constitute a valid excuse. The use of any illegal substances is prohibited. 10. Adopted CDC guidelines & recommendations: Target dosing levels will be at or below 60 MME/day. Use of benzodiazepines** is not  recommended.  Exceptions: There are only two exceptions to the rule of not receiving pain medications from other Healthcare Providers. 1.   Exception #1 (Emergencies): In the event of an emergency (i.e.: accident requiring emergency care), you are allowed to receive additional pain medication. However, you are responsible for: As soon as you are able, call our office [phone], at any time of the day or night, and leave a message stating your name, the date and nature of the emergency, and the name and dose of the medication prescribed. In the event that your call is answered by a member of our staff, make sure to document and save the date, time, and the name of the person that took your information.  2. Exception #2 (Planned Surgery): In the event that you are scheduled by another doctor or dentist to have any type of surgery or procedure, you are allowed (for a period no longer than 30 days), to receive additional pain medication, for the acute post-op pain. However, in this case, you are responsible for picking up a copy of our "Post-op Pain Management for Surgeons" handout, and giving it to your surgeon or dentist. This document is available at our office, and does not require an appointment to obtain it. Simply go to our office during business hours (Monday-Thursday from 8:00 AM to 4:00 PM) (Friday 8:00 AM to 12:00 Noon) or if you have a scheduled appointment with [place], prior to your surgery, and ask for it by name. In addition, you will need to provide [place] with your name, name of your surgeon, type of surgery, and date of procedure or surgery.  *Opioid medications include: morphine, codeine, oxycodone, oxymorphone, hydrocodone, hydromorphone, meperidine, tramadol, tapentadol, buprenorphine, fentanyl, methadone. **Benzodiazepine medications include: diazepam (Valium), alprazolam (Xanax), clonazepam (Klonopine), lorazepam (Ativan), clorazepate (Tranxene), chlordiazepoxide (Librium), estazolam (Prosom),  oxazepam (Serax), temazepam (Restoril), triazolam (Halcion) (Last updated: 10/28/2017) ____________________________________________________________________________________________   ALL MEDICATIONS WERE E-SCRIBED TO YOUR PHARMACY.

## 2018-08-08 NOTE — Progress Notes (Signed)
Patient's Name: Cynthia Jones  MRN: [id number]  Referring Provider: Guadalupe Maple, MD  DOB: 10-May-1941  PCP: Cynthia Maple, MD  DOS: 08/08/2018  Note by: Cynthia Francois NP  Service setting: Ambulatory outpatient  Specialty: Interventional Pain Management  Location: [facility] (AMB) Pain Management Facility    Patient type: Established    Primary Reason(s) for Visit: Encounter for prescription drug management. (Level of risk: moderate)  CC: Back Pain (lower); Knee Pain (bilaterally); Shoulder Pain (left); and Hand Pain (bilaterally)  HPI  Cynthia Jones is a 77 y.o. year old, female patient, who comes today for a medication management evaluation. She has Osteopenia; Depression; Essential hypertension; Diabetes mellitus ([place]); Osteoarthritis; Seasonal allergic rhinitis; Obesity; Intertrigo; Fibromyalgia; Bilateral occipital neuralgia; Greater trochanteric bursitis; Osteoarthritis of knee (Location of Primary Source of Pain) (Left); Sacroiliac joint disease; Lumbar facet syndrome (Bilateral) (L>R); Neuropathy due to secondary diabetes ([place]); S/P TKR Arthroplasty (Right); Folliculitis; Insomnia; Long term prescription opiate use; Long term prescription benzodiazepine use; Chronic pain syndrome; Chronic low back pain (Location of Secondary source of pain) (Bilateral) (L>R); Chronic knee pain (Location of Primary Source of Pain) (Bilateral) (L>R); Cervicogenic headache; Chronic hip pain (Bilateral); Chronic hand pain (Bilateral) (R>L); Chronic foot pain (Left); Hypercholesteremia; Lumbar spondylosis; Advanced care planning/counseling discussion; Contusion of foot; Pharmacologic therapy; Disorder of skeletal system; and Problems influencing health status on their problem list. Her primarily concern today is the Back Pain (lower); Knee Pain (bilaterally); Shoulder Pain (left); and Hand Pain (bilaterally)  Pain Assessment: Location: Lower Back Radiating: denies Onset: More than a month ago Duration:  Chronic pain Quality: Aching, Constant, Burning Severity: 8 /10 (subjective, self-reported pain score)  Note: Reported level is compatible with observation. Clinically the patient looks like a 2/10 A 2/10 is viewed as "Mild to Moderate" and described as noticeable and distracting. Impossible to hide from other people. More frequent flare-ups. Still possible to adapt and function close to normal. It can be very annoying and may have occasional stronger flare-ups. With discipline, patients may get used to it and adapt.       When using our objective Pain Scale, levels between 6 and 10/10 are said to belong in an emergency room, as it progressively worsens from a 6/10, described as severely limiting, requiring emergency care not usually available at an outpatient pain management facility. At a 6/10 level, communication becomes difficult and requires great effort. Assistance to reach the emergency department may be required. Facial flushing and profuse sweating along with potentially dangerous increases in heart rate and blood pressure will be evident. Effect on ADL: limits daily activities Timing: Constant Modifying factors: meds "barely help" BP: (!) 139/49  HR: 88  Cynthia Jones was last scheduled for an appointment on 05/09/2018 for medication management. During today's appointment we reviewed Cynthia Jones's chronic pain status, as well as her outpatient medication regimen.  Admits that her current medication is not effective.  She admits that she did take 2 pills on one occasion and this seems to be very effective.  She is not able to take NSAIDs.  She has failed interventional therapy.  She does not want to take oral steroids because of her history of diabetes which is now being controlled by diet.  She admits the pain is all over worse in her thumbs,  her shoulder and then in her back.  The patient  reports that she does not use drugs. Her body mass index is 41.93 kg/m.  Further details on both, my  assessment(s),  as well as the proposed treatment plan, please see below.  Controlled Substance Pharmacotherapy Assessment REMS (Risk Evaluation and Mitigation Strategy)  Analgesic:Oxycodone IR 5 mg 1 tablet by mouth twice a day MME/day:75m/day. Cynthia Jones  08/08/2018  1:35 PM  Signed Nursing Pain Medication Assessment:  Safety precautions to be maintained throughout the outpatient stay will include: orient to surroundings, keep bed in low position, maintain call bell within reach at all times, provide assistance with transfer out of bed and ambulation.  Medication Inspection Compliance: Pill count conducted under aseptic conditions, in front of the patient. Neither the pills nor the bottle was removed from the patient's sight at any time. Once count was completed pills were immediately returned to the patient in their original bottle.  Medication: Oxycodone IR Pill/Patch Count: 8 of 90 pills remain Pill/Patch Appearance: Markings consistent with prescribed medication Bottle Appearance: Standard pharmacy container. Clearly labeled. Filled Date: 170/ 14 / 2019 Last Medication intake:  Today   Pharmacokinetics: Liberation and absorption (onset of action): WNL Distribution (time to peak effect): WNL Metabolism and excretion (duration of action): WNL         Pharmacodynamics: Desired effects: Analgesia: Ms. JRylereports >50% benefit. Functional ability: Patient reports that medication allows her to accomplish basic ADLs Clinically meaningful improvement in function (CMIF): Sustained CMIF goals met Perceived effectiveness: Described as relatively effective, allowing for increase in activities of daily living (ADL) Undesirable effects: Side-effects or Adverse reactions: None reported Monitoring: [place] PMP: Online review of the past 151-montheriod conducted. Compliant with practice rules and regulations Last UDS on record: Summary  Date Value Ref Range Status  02/09/2018  FINAL  Final    Comment:    ==================================================================== TOXASSURE SELECT 13 (MW) ==================================================================== Test                             Result       Flag       Units Drug Present and Declared for Prescription Verification   Oxycodone                      1276         EXPECTED   ng/mg creat   Noroxycodone                   2447         EXPECTED   ng/mg creat    Sources of oxycodone include scheduled prescription medications.    Noroxycodone is an expected metabolite of oxycodone. Drug Absent but Declared for Prescription Verification   Alprazolam                     Not Detected UNEXPECTED ng/mg creat ==================================================================== Test                      Result    Flag   Units      Ref Range   Creatinine              109              mg/dL      >=20 ==================================================================== Declared Medications:  The flagging and interpretation on this report are based on the  following declared medications.  Unexpected results may arise from  inaccuracies in the declared medications.  **Note: The testing scope of this panel includes these medications:  Alprazolam (Xanax)  Oxycodone  **  Note: The testing scope of this panel does not include following  reported medications:  Carvedilol  Duloxetine  Losartan (Losartan Potassium)  Nystatin  Omeprazole (Nexium)  Polymyxin (PolyTrim)  Trimethoprim (PolyTrim) ==================================================================== For clinical consultation, please call [phone]. ====================================================================    UDS interpretation: Compliant          Medication Assessment Form: Reviewed. Patient indicates being compliant with therapy Treatment compliance: Compliant Risk Assessment Profile: Aberrant behavior: See prior evaluations. None  observed or detected today Comorbid factors increasing risk of overdose: See prior notes. No additional risks detected today Opioid risk tool (ORT) (Total Score): 1 Personal History of Substance Abuse (SUD-Substance use disorder):  Alcohol: Negative  Illegal Drugs: Negative  Rx Drugs: Negative  ORT Risk Level calculation: Low Risk Risk of substance use disorder (SUD): Low Opioid Risk Tool - 08/08/18 1345      Family History of Substance Abuse   Alcohol  Positive Female    Illegal Drugs  Negative    Rx Drugs  Negative      Personal History of Substance Abuse   Alcohol  Negative    Illegal Drugs  Negative    Rx Drugs  Negative      Age   Age between 45-45 years   No      History of Preadolescent Sexual Abuse   History of Preadolescent Sexual Abuse  Negative or Female      Psychological Disease   Psychological Disease  Negative    Depression  Negative      Total Score   Opioid Risk Tool Scoring  1    Opioid Risk Interpretation  Low Risk      ORT Scoring interpretation table:  Score <3 = Low Risk for SUD  Score between 4-7 = Moderate Risk for SUD  Score >8 = High Risk for Opioid Abuse   Risk Mitigation Strategies:  Patient Counseling: Covered Patient-Prescriber Agreement (PPA): Present and active  Notification to other healthcare providers: Done  Pharmacologic Plan: No change in therapy, at this time.             Laboratory Chemistry  Inflammation Markers (CRP: Acute Phase) (ESR: Chronic Phase) Lab Results  Component Value Date   CRP 1.1 (H) 09/24/2016   ESRSEDRATE 44 (H) 09/24/2016                         Rheumatology Markers No results found for: RF, ANA, LABURIC, URICUR, LYMEIGGIGMAB, LYMEABIGMQN, HLAB27                      Renal Function Markers Lab Results  Component Value Date   BUN 14 06/14/2018   CREATININE 0.76 06/14/2018   BCR 18 06/14/2018   GFRAA 89 06/14/2018   GFRNONAA 77 06/14/2018                             Hepatic Function  Markers Lab Results  Component Value Date   AST 11 06/14/2018   ALT 13 06/14/2018   ALBUMIN 4.3 06/14/2018   ALKPHOS 51 06/14/2018                        Electrolytes Lab Results  Component Value Date   NA 140 06/14/2018   K 4.5 06/14/2018   CL 101 06/14/2018   CALCIUM 9.7 06/14/2018   MG 2.1 09/24/2016  Neuropathy Markers Lab Results  Component Value Date   VITAMINB12 344 09/24/2016   HGBA1C 6.6 04/08/2016                        CNS Tests No results found for: COLORCSF, APPEARCSF, RBCCOUNTCSF, WBCCSF, POLYSCSF, LYMPHSCSF, EOSCSF, PROTEINCSF, GLUCCSF, JCVIRUS, CSFOLI, IGGCSF                      Bone Pathology Markers Lab Results  Component Value Date   25OHVITD1 20 (L) 09/24/2016   25OHVITD2 <1.0 09/24/2016   25OHVITD3 19 09/24/2016                         Coagulation Parameters Lab Results  Component Value Date   PLT 343 06/14/2018                        Cardiovascular Markers Lab Results  Component Value Date   HGB 13.3 06/14/2018   HCT 40.5 06/14/2018                         CA Markers No results found for: CEA, CA125, LABCA2                      Note: Lab results reviewed.  Recent Diagnostic Imaging Results  [place] AXILLA RIGHT CLINICAL DATA:  77 year old presenting with a possible palpable lump in the right axilla, intermittently tender. Annual mammographic evaluation.  EXAM: DIGITAL DIAGNOSTIC BILATERAL MAMMOGRAM WITH CAD AND TOMO  ULTRASOUND RIGHT AXILLA  COMPARISON:  Mammography 03/26/2014, 03/01/2013 and earlier. No prior right axillary ultrasound.  ACR Breast Density Category b: There are scattered areas of fibroglandular density.  FINDINGS: Standard 2D and tomosynthesis full field CC and MLO views of both breasts were obtained.  No findings suspicious for malignancy in either breast.  Benign dystrophic calcifications associated with a degenerating fibroadenoma in the lower left breast at middle to posterior  depth, unchanged dating back to 2010.  Mammographic images were processed with CAD.  On physical exam, there is no palpable mass or lymphadenopathy in the right axilla in the area of patient concern.  Targeted right axillary ultrasound is performed, showing normal fibrofatty tissue in the area of palpable concern. A normal-appearing level 2 lymph node is present behind the pectoralis muscle deep in the area of palpable concern measuring approximate 1.2 cm. No mass or pathologic lymphadenopathy is identified.  IMPRESSION: 1. No mammographic evidence of malignancy involving either breast. 2. No pathologic right axillary lymphadenopathy.  RECOMMENDATION: Screening mammogram in one year.(Code:SM-B-01Y)  I have discussed the findings and recommendations with the patient. Results were also provided in writing at the conclusion of the visit. If applicable, a reminder letter will be sent to the patient regarding the next appointment.  BI-RADS CATEGORY  2: Benign.  Electronically Signed   By: Evangeline Dakin M.D.   On: 11/03/2017 12:04 MM DIAG BREAST TOMO BILATERAL CLINICAL DATA:  77 year old presenting with a possible palpable lump in the right axilla, intermittently tender. Annual mammographic evaluation.  EXAM: DIGITAL DIAGNOSTIC BILATERAL MAMMOGRAM WITH CAD AND TOMO  ULTRASOUND RIGHT AXILLA  COMPARISON:  Mammography 03/26/2014, 03/01/2013 and earlier. No prior right axillary ultrasound.  ACR Breast Density Category b: There are scattered areas of fibroglandular density.  FINDINGS: Standard 2D and tomosynthesis full field CC and MLO views of both breasts were obtained.  No findings suspicious for malignancy in either breast.  Benign dystrophic calcifications associated with a degenerating fibroadenoma in the lower left breast at middle to posterior depth, unchanged dating back to 2010.  Mammographic images were processed with CAD.  On physical exam, there is no  palpable mass or lymphadenopathy in the right axilla in the area of patient concern.  Targeted right axillary ultrasound is performed, showing normal fibrofatty tissue in the area of palpable concern. A normal-appearing level 2 lymph node is present behind the pectoralis muscle deep in the area of palpable concern measuring approximate 1.2 cm. No mass or pathologic lymphadenopathy is identified.  IMPRESSION: 1. No mammographic evidence of malignancy involving either breast. 2. No pathologic right axillary lymphadenopathy.  RECOMMENDATION: Screening mammogram in one year.(Code:SM-B-01Y)  I have discussed the findings and recommendations with the patient. Results were also provided in writing at the conclusion of the visit. If applicable, a reminder letter will be sent to the patient regarding the next appointment.  BI-RADS CATEGORY  2: Benign.  Electronically Signed   By: Evangeline Dakin M.D.   On: 11/03/2017 12:04  Complexity Note: Imaging results reviewed. Results shared with Ms. Huestis, using Layman's terms.                         Meds   Current Outpatient Medications:  .  ALPRAZolam (XANAX) 0.25 MG tablet, Take 1 tablet (0.25 mg total) by mouth 2 (two) times daily as needed for anxiety., Disp: 60 tablet, Rfl: 4 .  carvedilol (COREG) 6.25 MG tablet, Take 1 tablet (6.25 mg total) by mouth 2 (two) times daily with a meal., Disp: 180 tablet, Rfl: 4 .  DULoxetine (CYMBALTA) 60 MG capsule, Take 1 capsule (60 mg total) by mouth daily., Disp: 90 capsule, Rfl: 4 .  esomeprazole (NEXIUM) 40 MG capsule, Take 40 mg by mouth daily at 12 noon., Disp: , Rfl:  .  glucose blood (IGLUCOSE TEST STRIPS) test strip, Use as instructed - freestyle insulin x strip, Disp: 100 each, Rfl: 12 .  losartan (COZAAR) 100 MG tablet, Take 1 tablet (100 mg total) by mouth daily., Disp: 90 tablet, Rfl: 4 .  nystatin (MYCOSTATIN/NYSTOP) powder, APPLY TO AFFECTED AREA 4 TIMES A DAY, Disp: 60 g, Rfl: 2 .   [START ON 08/12/2018] oxyCODONE (OXY IR/ROXICODONE) 5 MG immediate release tablet, Take 1 tablet (5 mg total) by mouth every 8 (eight) hours as needed for severe pain., Disp: 90 tablet, Rfl: 0 .  NONFORMULARY OR COMPOUNDED ITEM, Sig: Apply 1-2 gm(s) (2-4 pumps) to affected area, 3-4 times/day. (1 pump = 0.5 gm), Disp: 1 each, Rfl: 2 .  oxyCODONE (OXY IR/ROXICODONE) 5 MG immediate release tablet, Take 1 tablet (5 mg total) by mouth every 8 (eight) hours as needed for severe pain., Disp: 90 tablet, Rfl: 0 .  oxyCODONE (OXY IR/ROXICODONE) 5 MG immediate release tablet, Take 1 tablet (5 mg total) by mouth every 8 (eight) hours as needed for severe pain., Disp: 90 tablet, Rfl: 0  ROS  Constitutional: Denies any fever or chills Gastrointestinal: No reported hemesis, hematochezia, vomiting, or acute GI distress Musculoskeletal: Denies any acute onset joint swelling, redness, loss of ROM, or weakness Neurological: No reported episodes of acute onset apraxia, aphasia, dysarthria, agnosia, amnesia, paralysis, loss of coordination, or loss of consciousness  Allergies  Ms. Choma is allergic to nsaids and erythromycin.  [place]  Drug: Ms. Gesell  reports that she does not use drugs. Alcohol:  reports  that she does not drink alcohol. Tobacco:  reports that she has never smoked. She has never used smokeless tobacco. Medical:  has a past medical history of Acute anxiety (03/06/2015), Allergy, Anxiety, Degenerative joint disease of knee, Depression, Fibromyalgia, Obesity, Osteopenia, and RAD (reactive airway disease). Surgical: Ms. Segel  has a past surgical history that includes Parotidectomy (Left); Appendectomy; Cholecystectomy; Abdominal hysterectomy; Tonsillectomy; Joint replacement (2014); Eye surgery; and Breast cyst aspiration (Right, 04/18/2013). Family: family history includes Alzheimer's disease in her father; Cancer in her sister; Diabetes in her mother; Heart disease in her mother; Parkinson's  disease in her father; Stroke in her mother.  Constitutional Exam  General appearance: Well nourished, well developed, and well hydrated. In no apparent acute distress Vitals:   08/08/18 1335  BP: (!) 139/49  Pulse: 88  Resp: 18  Temp: 98 F (36.7 C)  TempSrc: Oral  SpO2: 99%  Weight: 214 lb 11.2 oz (97.4 kg)  Height: 5' (1.524 m)  Psych/Mental status: Alert, oriented x 3 (person, place, & time)       Eyes: PERLA Respiratory: No evidence of acute respiratory distress  Cervical Spine Area Exam  Skin & Axial Inspection: No masses, redness, edema, swelling, or associated skin lesions Alignment: Symmetrical Functional ROM: Unrestricted ROM      Stability: No instability detected Muscle Tone/Strength: Functionally intact. No obvious neuro-muscular anomalies detected. Sensory (Neurological): Unimpaired Palpation: No palpable anomalies              Upper Extremity (UE) Exam    Side: Right upper extremity  Side: Left upper extremity  Skin & Extremity Inspection: Bouchard's nodes (PIP)  Skin & Extremity Inspection: Bouchard's nodes (PIP)  Functional ROM: Unrestricted ROM          Functional ROM: Unrestricted ROM          Muscle Tone/Strength: Functionally intact. No obvious neuro-muscular anomalies detected.  Muscle Tone/Strength: Functionally intact. No obvious neuro-muscular anomalies detected.  Sensory (Neurological): Unimpaired          Sensory (Neurological): Unimpaired          Palpation: No palpable anomalies              Palpation: No palpable anomalies                   Lumbar Spine Area Exam  Skin & Axial Inspection: No masses, redness, or swelling Alignment: Symmetrical Functional ROM: Unrestricted ROM       Stability: No instability detected Muscle Tone/Strength: Functionally intact. No obvious neuro-muscular anomalies detected. Sensory (Neurological): Unimpaired Palpation: Complains of area being tender to palpation         Gait & Posture Assessment  Ambulation:  Unassisted Gait: Relatively normal for age and body habitus Posture: WNL   Lower Extremity Exam    Side: Right lower extremity  Side: Left lower extremity  Stability: No instability observed          Stability: No instability observed          Skin & Extremity Inspection: Skin color, temperature, and hair growth are WNL. No peripheral edema or cyanosis. No masses, redness, swelling, asymmetry, or associated skin lesions. No contractures.  Skin & Extremity Inspection: Skin color, temperature, and hair growth are WNL. No peripheral edema or cyanosis. No masses, redness, swelling, asymmetry, or associated skin lesions. No contractures.  Functional ROM: Unrestricted ROM                  Functional ROM: Unrestricted ROM  Muscle Tone/Strength: Functionally intact. No obvious neuro-muscular anomalies detected.  Muscle Tone/Strength: Functionally intact. No obvious neuro-muscular anomalies detected.  Sensory (Neurological): Unimpaired        Sensory (Neurological): Unimpaired            Palpation: No palpable anomalies  Palpation: No palpable anomalies   Assessment  Primary Diagnosis & Pertinent Problem List: The primary encounter diagnosis was Lumbar spondylosis. Diagnoses of Lumbar facet syndrome (Bilateral) (L>R), Chronic pain syndrome, Pharmacologic therapy, Disorder of skeletal system, and Problems influencing health status were also pertinent to this visit.  Status Diagnosis  Controlled Controlled Controlled 1. Lumbar spondylosis   2. Lumbar facet syndrome (Bilateral) (L>R)   3. Chronic pain syndrome   4. Pharmacologic therapy   5. Disorder of skeletal system   6. Problems influencing health status     Problems updated and reviewed during this visit: Problem  Pharmacologic Therapy  Disorder of Skeletal System  Problems Influencing Health Status   Plan of Care  Pharmacotherapy (Medications Ordered): Meds ordered this encounter  Medications  . oxyCODONE (OXY  IR/ROXICODONE) 5 MG immediate release tablet    Sig: Take 1 tablet (5 mg total) by mouth every 8 (eight) hours as needed for severe pain.    Dispense:  90 tablet    Refill:  0    Do not place this medication, or any other prescription from our practice, on "Automatic Refill". Patient may have prescription filled one day early if pharmacy is closed on scheduled refill date.    Order Specific Question:   Supervising Provider    Answer:   Milinda Pointer [phone]  . NONFORMULARY OR COMPOUNDED ITEM    Sig: Sig: Apply 1-2 gm(s) (2-4 pumps) to affected area, 3-4 times/day. (1 pump = 0.5 gm)    Dispense:  1 each    Refill:  2    Compounded cream: 3% Gabapentin, 5% Lidocaine, 2% cyclobenzaprine Dispense: 120 gm Pump Bottle. (Dispenser: 1 pump = 0.5 gm.)    Order Specific Question:   Supervising Provider    Answer:   Milinda Pointer [phone]   New Prescriptions   NONFORMULARY OR COMPOUNDED ITEM    Sig: Apply 1-2 gm(s) (2-4 pumps) to affected area, 3-4 times/day. (1 pump = 0.5 gm)   Medications administered today: Grayling Congress had no medications administered during this visit. Lab-work, procedure(s), and/or referral(s): Orders Placed This Encounter  Procedures  . Sedimentation rate  . Magnesium  . Vitamin B12  . C-reactive protein  . 25-Hydroxyvitamin D Lcms D2+D3   Imaging and/or referral(s): None   Interventional therapies: Planned, scheduled, and/or pending:  Not at this time.  Declined interventional therapy and steroid pack   Considering:  Diagnostic right Genicular nerve block Possible right Genicular nerve radiofrequencyablation  Diagnosticleft intra-articular knee joint injection Possible series of 5 left intra-articular Hyalgan knee injections Diagnostic left Genicular nerve block Possibleleft Genicular nerve RFA Diagnostic bilateral lumbar facet block Possible bilateral lumbar facet RFA Diagnostic left lumbar epidural steroid  injection Diagnostic caudal epidural steroid injection + epidurogram Possible Racz procedure Diagnostic bilateral cervical facet block Possible bilateral cervical facet RFA Diagnostic right versus left cervical epidural steroid injection Diagnostic bilateral intra-articular shoulder joint injection Diagnostic bilateral suprascapular nerve block Possible bilateral suprascapular nerve RFA   Palliative PRN treatment(s):  PalliativebilateralGenicular nerve block Palliativeleft intra-articular knee joint injection Palliativebilateral lumbar facet block  Palliativeleft lumbar epidural steroid injection  Palliativecaudal epiduralsteroid injection + epidurogram  Palliativebilateral cervical facet block Palliativeright versus left cervical epidural steroid injection  Palliativebilateral intra-articular shoulderjoint injection  Palliativebilateral suprascapularnerve block    Provider-requested follow-up: Return in about 4 weeks (around 09/05/2018) for MedMgmt follow up for treatment change.  Future Appointments  Date Time Provider [place]  09/05/2018  1:00 PM Milinda Pointer, MD [facility] None  09/15/2018  1:00 PM Cynthia Maple, MD [facility] [place]  12/19/2018  1:30 PM Cynthia Maple, MD [facility]  04/12/2019  9:30 AM CFP NURSE HEALTH ADVISOR [facility]FP[facility]FP [place]   Primary Care Physician: Cynthia Maple, MD Location: [facility] Note by: Cynthia Francois NP Date: 08/08/2018; Time: 4:01 PM  Pain Score Disclaimer: We use the NRS-11 scale. This is a self-reported, subjective measurement of pain severity with only modest accuracy. It is used primarily to identify changes within a particular patient. It must be understood that outpatient pain scales are significantly less accurate that those used for research, where they can be applied under ideal controlled circumstances with minimal exposure to variables. In reality, the score is likely to  be a combination of pain intensity and pain affect, where pain affect describes the degree of emotional arousal or changes in action readiness caused by the sensory experience of pain. Factors such as social and work situation, setting, emotional state, anxiety levels, expectation, and prior pain experience may influence pain perception and show large inter-individual differences that may also be affected by time variables.  Patient instructions provided during this appointment: Patient Instructions  ____________________________________________________________________________________________  Medication Rules  Purpose: To inform patients, and their family members, of our rules and regulations.  Applies to: All patients receiving prescriptions (written or electronic).  Pharmacy of record: Pharmacy where electronic prescriptions will be sent. If written prescriptions are taken to a different pharmacy, please inform the nursing staff. The pharmacy listed in the electronic medical record should be the one where you would like electronic prescriptions to be sent.  Electronic prescriptions: In compliance with the [place] (STOP) Act of 2017 (Session Lanny Cramp [phone]), effective August 31, 2018, all controlled substances must be electronically prescribed. Calling prescriptions to the pharmacy will cease to exist.  Prescription refills: Only during scheduled appointments. Applies to all prescriptions.  NOTE: The following applies primarily to controlled substances (Opioid* Pain Medications).   Patient's responsibilities: 1. Pain Pills: Bring all pain pills to every appointment (except for procedure appointments). 2. Pill Bottles: Bring pills in original pharmacy bottle. Always bring the newest bottle. Bring bottle, even if empty. 3. Medication refills: You are responsible for knowing and keeping track of what medications you take and those you need  refilled. The day before your appointment: write a list of all prescriptions that need to be refilled. The day of the appointment: give the list to the admitting nurse. Prescriptions will be written only during appointments. If you forget a medication: it will not be "Called in", "Faxed", or "electronically sent". You will need to get another appointment to get these prescribed. No early refills. Do not call asking to have your prescription filled early. 4. Prescription Accuracy: You are responsible for carefully inspecting your prescriptions before leaving our office. Have the discharge nurse carefully go over each prescription with you, before taking them home. Make sure that your name is accurately spelled, that your address is correct. Check the name and dose of your medication to make sure it is accurate. Check the number of pills, and the written instructions to make sure they are clear and accurate. Make sure that you are  given enough medication to last until your next medication refill appointment. 5. Taking Medication: Take medication as prescribed. When it comes to controlled substances, taking less pills or less frequently than prescribed is permitted and encouraged. Never take more pills than instructed. Never take medication more frequently than prescribed.  6. Inform other Doctors: Always inform, all of your healthcare providers, of all the medications you take. 7. Pain Medication from other Providers: You are not allowed to accept any additional pain medication from any other Doctor or Healthcare provider. There are two exceptions to this rule. (see below) In the event that you require additional pain medication, you are responsible for notifying us, as stated below. 8. Medication Agreement: You are responsible for carefully reading and following our Medication Agreement. This must be signed before receiving any prescriptions from our practice. Safely store a copy of your signed Agreement.  Violations to the Agreement will result in no further prescriptions. (Additional copies of our Medication Agreement are available upon request.) 9. Laws, Rules, & Regulations: All patients are expected to follow all Federal and [employer], Rules, [employer]. Ignorance of the Laws does not constitute a valid excuse. The use of any illegal substances is prohibited. 10. Adopted CDC guidelines & recommendations: Target dosing levels will be at or below 60 MME/day. Use of benzodiazepines** is not recommended.  Exceptions: There are only two exceptions to the rule of not receiving pain medications from other Healthcare Providers. 1. Exception #1 (Emergencies): In the event of an emergency (i.e.: accident requiring emergency care), you are allowed to receive additional pain medication. However, you are responsible for: As soon as you are able, call our office [phone], at any time of the day or night, and leave a message stating your name, the date and nature of the emergency, and the name and dose of the medication prescribed. In the event that your call is answered by a member of our staff, make sure to document and save the date, time, and the name of the person that took your information.  2. Exception #2 (Planned Surgery): In the event that you are scheduled by another doctor or dentist to have any type of surgery or procedure, you are allowed (for a period no longer than 30 days), to receive additional pain medication, for the acute post-op pain. However, in this case, you are responsible for picking up a copy of our "Post-op Pain Management for Surgeons" handout, and giving it to your surgeon or dentist. This document is available at our office, and does not require an appointment to obtain it. Simply go to our office during business hours (Monday-Thursday from 8:00 AM to 4:00 PM) (Friday 8:00 AM to 12:00 Noon) or if you have a scheduled appointment with [place], prior to your surgery, and ask  for it by name. In addition, you will need to provide [place] with your name, name of your surgeon, type of surgery, and date of procedure or surgery.  *Opioid medications include: morphine, codeine, oxycodone, oxymorphone, hydrocodone, hydromorphone, meperidine, tramadol, tapentadol, buprenorphine, fentanyl, methadone. **Benzodiazepine medications include: diazepam (Valium), alprazolam (Xanax), clonazepam (Klonopine), lorazepam (Ativan), clorazepate (Tranxene), chlordiazepoxide (Librium), estazolam (Prosom), oxazepam (Serax), temazepam (Restoril), triazolam (Halcion) (Last updated: 10/28/2017) ____________________________________________________________________________________________   ALL MEDICATIONS WERE E-SCRIBED TO YOUR PHARMACY.

## 2018-08-08 NOTE — Progress Notes (Signed)
Nursing Pain Medication Assessment:  Safety precautions to be maintained throughout the outpatient stay will include: orient to surroundings, keep bed in low position, maintain call bell within reach at all times, provide assistance with transfer out of bed and ambulation.  Medication Inspection Compliance: Pill count conducted under aseptic conditions, in front of the patient. Neither the pills nor the bottle was removed from the patient's sight at any time. Once count was completed pills were immediately returned to the patient in their original bottle.  Medication: Oxycodone IR Pill/Patch Count: 8 of 90 pills remain Pill/Patch Appearance: Markings consistent with prescribed medication Bottle Appearance: Standard pharmacy container. Clearly labeled. Filled Date: 51 / 14 / 2019 Last Medication intake:  Today

## 2018-08-09 ENCOUNTER — Other Ambulatory Visit: Payer: Self-pay | Admitting: Nurse Practitioner

## 2018-08-09 ENCOUNTER — Encounter: Payer: Self-pay | Admitting: Nurse Practitioner

## 2018-08-09 DIAGNOSIS — R700 Elevated erythrocyte sedimentation rate: Secondary | ICD-10-CM | POA: Insufficient documentation

## 2018-08-09 DIAGNOSIS — R7982 Elevated C-reactive protein (CRP): Secondary | ICD-10-CM | POA: Insufficient documentation

## 2018-08-11 LAB — 25-HYDROXY VITAMIN D LCMS D2+D3
25-Hydroxy, Vitamin D-2: <1 ng/mL
25-Hydroxy, Vitamin D-3: 20 ng/mL
25-Hydroxy, Vitamin D: 20 ng/mL — ABNORMAL LOW

## 2018-08-11 LAB — C-REACTIVE PROTEIN: CRP: 17 mg/L — ABNORMAL HIGH (ref 0–10)

## 2018-08-11 LAB — VITAMIN B12: Vitamin B-12: 406 pg/mL (ref 232–1245)

## 2018-08-11 LAB — SEDIMENTATION RATE: Sed Rate: 94 mm/hr — ABNORMAL HIGH (ref 0–40)

## 2018-08-11 LAB — MAGNESIUM: Magnesium: 2 mg/dL (ref 1.6–2.3)

## 2018-09-04 NOTE — Progress Notes (Signed)
Patient's Name: Cynthia Jones  MRN: [id number]  Referring Provider: Guadalupe Maple, MD  DOB: 06-Apr-1941  PCP: Guadalupe Maple, MD  DOS: 09/05/2018  Note by: Gaspar Cola, MD  Service setting: Ambulatory outpatient  Specialty: Interventional Pain Management  Location: [facility] (AMB) Pain Management Facility    Patient type: Established   Primary Reason(s) for Visit: Encounter for prescription drug management. (Level of risk: moderate)  CC: Back Pain (lower bilateral); Knee Pain (bilateral); Shoulder Pain (bilateral); and Generalized Body Aches (patient states she just hurts all over )  HPI  Cynthia Jones is a 78 y.o. year old, female patient, who comes today for a medication management evaluation. She has Osteopenia; Depression; Essential hypertension; Diabetes mellitus ([place]); Osteoarthritis; Seasonal allergic rhinitis; Obesity; Intertrigo; Fibromyalgia; Bilateral occipital neuralgia; Greater trochanteric bursitis; Osteoarthritis of knee (Left); Sacroiliac joint disease; Lumbar facet syndrome (Bilateral) (L>R); Neuropathy due to secondary diabetes ([place]); S/P TKR Arthroplasty (Right); Folliculitis; Insomnia; Long term prescription opiate use; Long term prescription benzodiazepine use; Chronic pain syndrome; Chronic low back pain (Secondary Area of Pain) (Bilateral) (L>R); Chronic knee pain (Primary Area of Pain) (Bilateral) (L>R); Cervicogenic headache; Chronic hip pain (Bilateral); Chronic hand pain (Bilateral) (R>L); Chronic foot pain (Left); Hypercholesteremia; Lumbar spondylosis; Advanced care planning/counseling discussion; Contusion of foot; Pharmacologic therapy; Disorder of skeletal system; Problems influencing health status; Elevated C-reactive protein (CRP); Elevated sed rate; and Vitamin D deficiency on their problem list. Her primarily concern today is the Back Pain (lower bilateral); Knee Pain (bilateral); Shoulder Pain (bilateral); and Generalized Body Aches (patient states she just  hurts all over )  Pain Assessment: Location: Left, Right Knee(see visit info for additional pain sites. ) Radiating: denies  Onset: More than a month ago Duration: Chronic pain Severity: 7 /10 (subjective, self-reported pain score)  Note: Reported level is inconsistent with clinical observations. Clinically the patient looks like a 2/10 A 2/10 is viewed as "Mild to Moderate" and described as noticeable and distracting. Impossible to hide from other people. More frequent flare-ups. Still possible to adapt and function close to normal. It can be very annoying and may have occasional stronger flare-ups. With discipline, patients may get used to it and adapt. Information on the proper use of the pain scale provided to the patient today. When using our objective Pain Scale, levels between 6 and 10/10 are said to belong in an emergency room, as it progressively worsens from a 6/10, described as severely limiting, requiring emergency care not usually available at an outpatient pain management facility. At a 6/10 level, communication becomes difficult and requires great effort. Assistance to reach the emergency department may be required. Facial flushing and profuse sweating along with potentially dangerous increases in heart rate and blood pressure will be evident. Effect on ADL: patient states she doesn't want to get up in the morning because she knows that her knees are going to hurt  Timing: Constant Modifying factors: topical creams, pain medications  BP: (!) 141/50  HR: 82  Cynthia Jones was last scheduled for an appointment on Visit date not found for medication management. During today's appointment we reviewed Cynthia Jones's chronic pain status, as well as her outpatient medication regimen.  The patient returns to the clinic today to review some changes made by Dionisio David, NP, to her medication regimen.  The patient indicates that both of the changes made have helped, patient continues to have  pain.  Unfortunately, I have identified that the patient has been started on alprazolam, which I rather  not have her take, based on CDC guidelines.  In addition, the increase in her oxycodone from 10 mg/day to 15 mg/day did help, but now she is asking about taking 2 tablets at a time.  There is a trend that I do not want her to follow and therefore this is when we will begin to work on interventional therapies to bring down her pain so as to also bring down the amount of medication that she is taking.  Recent lab work has shown the patient to have an elevated sed rate and C-reactive protein.  Because of this, I will be ordering further lab work to look at her ANA and rheumatoid factor.  The patient  reports no history of drug use. Her body mass index is 39.14 kg/m.  Further details on both, my assessment(s), as well as the proposed treatment plan, please see below.  Controlled Substance Pharmacotherapy Assessment REMS (Risk Evaluation and Mitigation Strategy)  Analgesic: Oxycodone IR 5 mg 1 tablet by mouth every 8 hours. MME/day:'15mg'$ /day. Cynthia Billow, RN  09/05/2018  1:09 PM  Sign when Signing Visit Nursing Pain Medication Assessment:  Safety precautions to be maintained throughout the outpatient stay will include: orient to surroundings, keep bed in low position, maintain call bell within reach at all times, provide assistance with transfer out of bed and ambulation.  Medication Inspection Compliance: Pill count conducted under aseptic conditions, in front of the patient. Neither the pills nor the bottle was removed from the patient's sight at any time. Once count was completed pills were immediately returned to the patient in their original bottle.  Medication: Oxycodone IR Pill/Patch Count: 23 of 90 pills remain Pill/Patch Appearance: Markings consistent with prescribed medication Bottle Appearance: Standard pharmacy container. Clearly labeled. Filled Date: 10 / 13 / 2019 Last  Medication intake:  Today   Pharmacokinetics: Liberation and absorption (onset of action): WNL Distribution (time to peak effect): WNL Metabolism and excretion (duration of action): WNL         Pharmacodynamics: Desired effects: Analgesia: Ms. Malta reports >50% benefit. Functional ability: Patient reports that medication allows her to accomplish basic ADLs Clinically meaningful improvement in function (CMIF): Sustained CMIF goals met Perceived effectiveness: Described as relatively effective, allowing for increase in activities of daily living (ADL) Undesirable effects: Side-effects or Adverse reactions: None reported Monitoring: [place] PMP: Online review of the past 21-monthperiod conducted. Compliant with practice rules and regulations Last UDS on record: Summary  Date Value Ref Range Status  02/09/2018 FINAL  Final    Comment:    ==================================================================== TOXASSURE SELECT 13 (MW) ==================================================================== Test                             Result       Flag       Units Drug Present and Declared for Prescription Verification   Oxycodone                      1276         EXPECTED   ng/mg creat   Noroxycodone                   2447         EXPECTED   ng/mg creat    Sources of oxycodone include scheduled prescription medications.    Noroxycodone is an expected metabolite of oxycodone. Drug Absent but Declared for Prescription Verification   Alprazolam  Not Detected UNEXPECTED ng/mg creat ==================================================================== Test                      Result    Flag   Units      Ref Range   Creatinine              109              mg/dL      >=20 ==================================================================== Declared Medications:  The flagging and interpretation on this report are based on the  following declared medications.  Unexpected  results may arise from  inaccuracies in the declared medications.  **Note: The testing scope of this panel includes these medications:  Alprazolam (Xanax)  Oxycodone  **Note: The testing scope of this panel does not include following  reported medications:  Carvedilol  Duloxetine  Losartan (Losartan Potassium)  Nystatin  Omeprazole (Nexium)  Polymyxin (PolyTrim)  Trimethoprim (PolyTrim) ==================================================================== For clinical consultation, please call [phone]. ====================================================================    UDS interpretation: Compliant          Medication Assessment Form: Reviewed. Patient indicates being compliant with therapy Treatment compliance: Compliant Risk Assessment Profile: Aberrant behavior: See prior evaluations. None observed or detected today Comorbid factors increasing risk of overdose: See prior notes. No additional risks detected today Risk of substance use disorder (SUD): Low  ORT Scoring interpretation table:  Score <3 = Low Risk for SUD  Score between 4-7 = Moderate Risk for SUD  Score >8 = High Risk for Opioid Abuse   Risk Mitigation Strategies:  Patient Counseling: Covered Patient-Prescriber Agreement (PPA): Present and active  Notification to other healthcare providers: Done  Pharmacologic Plan: No change in therapy, at this time.             Laboratory Chemistry  Inflammation Markers (CRP: Acute Phase) (ESR: Chronic Phase) Lab Results  Component Value Date   CRP 17 (H) 08/08/2018   ESRSEDRATE 94 (H) 08/08/2018                         Rheumatology Markers No results found.  Renal Function Markers Lab Results  Component Value Date   BUN 14 06/14/2018   CREATININE 0.76 06/14/2018   BCR 18 06/14/2018   GFRAA 89 06/14/2018   GFRNONAA 77 06/14/2018                             Hepatic Function Markers Lab Results  Component Value Date   AST 11 06/14/2018   ALT 13  06/14/2018   ALBUMIN 4.3 06/14/2018   ALKPHOS 51 06/14/2018                        Electrolytes Lab Results  Component Value Date   NA 140 06/14/2018   K 4.5 06/14/2018   CL 101 06/14/2018   CALCIUM 9.7 06/14/2018   MG 2.0 08/08/2018                        Neuropathy Markers Lab Results  Component Value Date   VITAMINB12 406 08/08/2018   HGBA1C 6.6 06/14/2018                        CNS Tests No results found.  Bone Pathology Markers Lab Results  Component Value Date   25OHVITD1 20 (L) 08/08/2018  25OHVITD2 <1.0 08/08/2018   25OHVITD3 20 08/08/2018                         Coagulation Parameters Lab Results  Component Value Date   PLT 343 06/14/2018                        Cardiovascular Markers Lab Results  Component Value Date   HGB 13.3 06/14/2018   HCT 40.5 06/14/2018                         CA Markers No results found.  Note: Lab results reviewed.  Recent Diagnostic Imaging Review  Cervical Imaging: Cervical DG complete:  Results for orders placed during the hospital encounter of 09/08/16  DG Cervical Spine Complete   Narrative CLINICAL DATA:  Neck pain for 2 years.  Fibromyalgia.  Osteopenia.  EXAM: CERVICAL SPINE - COMPLETE 4+ VIEW  COMPARISON:  None.  FINDINGS: The cervical spine is visualized the skullbase through the cervicothoracic junction. Grade 1 degenerative anterolisthesis is evident at C4-5 C5-6, and C6-7. Vertebral body heights are maintained. Uncovertebral spurring an osseous foraminal narrowing is worse on the left at C4-5 and C5-6. Atherosclerotic calcifications are also worse on the left. The lung apices are clear.  IMPRESSION: 1. Multilevel spondylosis of the cervical spine. 2. Osseous foraminal narrowing due to uncovertebral disease is greatest on the left at C4-5 and C5-6. 3. Carotid atherosclerosis.   Electronically Signed   By: San Morelle M.D.   On: 09/08/2016 14:25    Lumbosacral Imaging: Lumbar DG  Bending views:  Results for orders placed during the hospital encounter of 09/08/16  DG Lumbar Spine Complete W/Bend   Narrative CLINICAL DATA:  Chronic lower back pain without known injury.  EXAM: LUMBAR SPINE - COMPLETE WITH BENDING VIEWS  COMPARISON:  None.  FINDINGS: No fracture or spondylolisthesis is noted. No change in vertebral body alignment is noted on the flexion or extension views. Mild degenerative disc disease is noted at L1-2 and L4-5. Moderate degenerative disc disease is noted at L5-S1. Atherosclerosis of abdominal aorta is noted.  IMPRESSION: Multilevel degenerative disc disease. No acute abnormality seen in the lumbar spine.   Electronically Signed   By: Marijo Conception, M.D.   On: 09/08/2016 14:29    Sacroiliac Joint Imaging: Sacroiliac Joint DG:  Results for orders placed during the hospital encounter of 09/08/16  DG Si Joints   Narrative CLINICAL DATA:  Lower back pain for 10 years without known injury.  EXAM: BILATERAL SACROILIAC JOINTS - 3+ VIEW  COMPARISON:  None.  FINDINGS: The sacroiliac joint spaces are maintained and there is no evidence of arthropathy. No other bone abnormalities are seen.  IMPRESSION: Normal sacroiliac joints.   Electronically Signed   By: Marijo Conception, M.D.   On: 09/08/2016 14:26    Hip Imaging: Hip-R DG 2-3 views:  Results for orders placed during the hospital encounter of 09/08/16  DG HIP UNILAT W OR W/O PELVIS 2-3 VIEWS RIGHT   Narrative CLINICAL DATA:  Chronic low back pain.  Hip pain.  No known injury.  EXAM: DG HIP (WITH OR WITHOUT PELVIS) 2-3V RIGHT  COMPARISON:  None.  FINDINGS: No acute bony or joint abnormality is identified. Mild degenerative change is seen about both hips appears symmetric from right to left. No focal bony lesion. Atherosclerotic calcifications noted.  IMPRESSION: No acute abnormality.  Mild  bilateral hip degenerative disease.   Electronically Signed   By: Inge Rise M.D.   On: 09/08/2016 14:28    Hip-L DG 2-3 views:  Results for orders placed during the hospital encounter of 09/08/16  DG HIP UNILAT W OR W/O PELVIS 2-3 VIEWS LEFT   Narrative CLINICAL DATA:  Chronic low back pain.  Hip pain.  No known injury.  EXAM: DG HIP (WITH OR WITHOUT PELVIS) 2-3V LEFT  COMPARISON:  None.  FINDINGS: No acute bony or joint abnormality is seen. Mild degenerative change is present about the hips and appears symmetric from right to left. No focal bony lesion. Soft tissues unremarkable.  IMPRESSION: Mild bilateral hip degenerative disease.   Electronically Signed   By: Inge Rise M.D.   On: 09/08/2016 14:27    Knee Imaging: Knee-R DG 1-2 views:  Results for orders placed during the hospital encounter of 09/08/16  DG Knee 1-2 Views Right   Narrative CLINICAL DATA:  Chronic bilateral knee pain.  No known injury.  EXAM: RIGHT KNEE - 1-2 VIEW  COMPARISON:  Plain films of the right knee 03/21/2008.  FINDINGS: Right total knee arthroplasty is in place. The device is located. No hardware complication is identified. No fracture. No joint effusion.  IMPRESSION: No acute abnormality. Right total knee replacement without complicating feature.   Electronically Signed   By: Inge Rise M.D.   On: 09/08/2016 14:27    Knee-L DG 1-2 views:  Results for orders placed during the hospital encounter of 09/08/16  DG Knee 1-2 Views Left   Narrative CLINICAL DATA:  Joint pain, no trauma  EXAM: LEFT KNEE - 1-2 VIEW  COMPARISON:  None  FINDINGS: There is primarily bicompartmental degenerative joint disease of the left knee mainly involving the medial compartment where there is more loss of joint space, sclerosis, and spurring. Degenerative change also involves the patellofemoral articulation. The lateral compartment is relatively well preserved. No fracture is seen. No joint effusion is noted. Soft tissue calcifications are noted in  the soft tissues just inferior to the tip of the patella.  IMPRESSION: Bicompartmental degenerative joint disease of the left knee primarily involving the medial compartment. No fracture or joint effusion.   Electronically Signed   By: Ivar Drape M.D.   On: 09/08/2016 14:27    Foot Imaging: Foot-L DG Complete:  Results for orders placed during the hospital encounter of 09/08/16  DG Foot Complete Left   Narrative CLINICAL DATA:  Left foot pain for 6 months. No known injury. Initial encounter.  EXAM: LEFT FOOT - COMPLETE 3+ VIEW  COMPARISON:  None.  FINDINGS: No acute bony or joint abnormality is identified. Mild first MTP osteoarthritis is seen. Calcaneal spurring is noted. Soft tissues are unremarkable.  IMPRESSION: No acute abnormality.  Mild first MTP osteoarthritis.  Calcaneal spurring.   Electronically Signed   By: Inge Rise M.D.   On: 09/08/2016 14:32    Wrist Imaging: Wrist-L DG Complete:  Results for orders placed during the hospital encounter of 08/01/17  DG Wrist Complete Left   Narrative CLINICAL DATA:  Acute wrist pain following fall 2 days ago. Initial encounter.  EXAM: LEFT WRIST - COMPLETE 3+ VIEW  COMPARISON:  09/08/2016 radiographs  FINDINGS: There is no evidence of acute fracture, subluxation or dislocation.  Degenerative changes at the first carpometacarpal joint again identified.  Calcification along dorsal wrist is compatible with prior trauma.  IMPRESSION: No evidence of acute abnormality   Electronically Signed   By:  Margarette Canada M.D.   On: 08/01/2017 16:45    Hand Imaging: Hand-R DG Complete:  Results for orders placed during the hospital encounter of 09/08/16  DG Hand Complete Right   Narrative CLINICAL DATA:  Chronic bilateral hand pain.  No known injury.  EXAM: RIGHT HAND - COMPLETE 3+ VIEW  COMPARISON:  None.  FINDINGS: No acute bony or joint abnormality is identified. Joint space narrowing and  osteophytosis are seen about the first [facility] joint, IP joint of the thumb and DIP joints of all the fingers. Changes appear worst at the DIP joints of the index and long fingers. Soft tissues are unremarkable.  IMPRESSION: No acute abnormality.  Osteoarthritis first [place] joint, IP joint of the thumb and DIP joints of all the fingers. Changes are worst at the DIP joints of the index and long fingers.   Electronically Signed   By: Inge Rise M.D.   On: 09/08/2016 14:31    Hand-L DG Complete:  Results for orders placed during the hospital encounter of 09/08/16  DG Hand Complete Left   Narrative CLINICAL DATA:  Chronic bilateral hand pain. No known injury. Initial encounter.  EXAM: LEFT HAND - COMPLETE 3+ VIEW  COMPARISON:  None.  FINDINGS: No acute bony or joint abnormality is identified. Joint space narrowing and osteophytosis are seen about the first [facility] joint, IP joint of the thumb and DIP joints of the index and long fingers. Changes appear worst about the index and long fingers. Small calcification projecting over the dorsum of the wrist may be due to remote trauma. Soft tissues are otherwise unremarkable.  IMPRESSION: No acute abnormality.  Osteoarthritis first [place] joint, IP joint of the thumb and DIP joints of the index and long fingers.   Electronically Signed   By: Inge Rise M.D.   On: 09/08/2016 14:29    Complexity Note: Imaging results reviewed. Results shared with Ms. Buxbaum, using Layman's terms.                         Meds   Current Outpatient Medications:  .  ALPRAZolam (XANAX) 0.25 MG tablet, Take 1 tablet (0.25 mg total) by mouth 2 (two) times daily as needed for anxiety., Disp: 60 tablet, Rfl: 4 .  carvedilol (COREG) 6.25 MG tablet, Take 1 tablet (6.25 mg total) by mouth 2 (two) times daily with a meal., Disp: 180 tablet, Rfl: 4 .  DULoxetine (CYMBALTA) 60 MG capsule, Take 1 capsule (60 mg total) by mouth daily., Disp: 90 capsule,  Rfl: 4 .  esomeprazole (NEXIUM) 40 MG capsule, Take 40 mg by mouth daily at 12 noon., Disp: , Rfl:  .  glucose blood (IGLUCOSE TEST STRIPS) test strip, Use as instructed - freestyle insulin x strip, Disp: 100 each, Rfl: 12 .  losartan (COZAAR) 100 MG tablet, Take 1 tablet (100 mg total) by mouth daily., Disp: 90 tablet, Rfl: 4 .  NONFORMULARY OR COMPOUNDED ITEM, Sig: Apply 1-2 gm(s) (2-4 pumps) to affected area, 3-4 times/day. (1 pump = 0.5 gm), Disp: 1 each, Rfl: 2 .  nystatin (MYCOSTATIN/NYSTOP) powder, APPLY TO AFFECTED AREA 4 TIMES A DAY, Disp: 60 g, Rfl: 2 .  [START ON 11/10/2018] oxyCODONE (OXY IR/ROXICODONE) 5 MG immediate release tablet, Take 1 tablet (5 mg total) by mouth every 8 (eight) hours as needed for severe pain., Disp: 90 tablet, Rfl: 0 .  calcium carbonate (CALCIUM 600) 600 MG TABS tablet, Take 1 tablet (600 mg total) by  mouth 2 (two) times daily with a meal., Disp: 60 tablet, Rfl: 0 .  Cholecalciferol (VITAMIN D3) 125 MCG (5000 UT) CAPS, Take 1 capsule (5,000 Units total) by mouth daily with breakfast. Take along with calcium and magnesium., Disp: 30 capsule, Rfl: 5 .  ergocalciferol (VITAMIN D2) 1.25 MG (50000 UT) capsule, Take 1 capsule (50,000 Units total) by mouth 2 (two) times a week. X 6 weeks., Disp: 12 capsule, Rfl: 0 .  Magnesium 500 MG CAPS, Take 1 capsule (500 mg total) by mouth 2 (two) times daily at 8 am and 10 pm., Disp: 60 capsule, Rfl: 5 .  [START ON 10/11/2018] oxyCODONE (OXY IR/ROXICODONE) 5 MG immediate release tablet, Take 1 tablet (5 mg total) by mouth every 8 (eight) hours as needed for severe pain., Disp: 90 tablet, Rfl: 0 .  [START ON 09/11/2018] oxyCODONE (OXY IR/ROXICODONE) 5 MG immediate release tablet, Take 1 tablet (5 mg total) by mouth every 8 (eight) hours as needed for severe pain., Disp: 90 tablet, Rfl: 0  ROS  Constitutional: Denies any fever or chills Gastrointestinal: No reported hemesis, hematochezia, vomiting, or acute GI  distress Musculoskeletal: Denies any acute onset joint swelling, redness, loss of ROM, or weakness Neurological: No reported episodes of acute onset apraxia, aphasia, dysarthria, agnosia, amnesia, paralysis, loss of coordination, or loss of consciousness  Allergies  Ms. Crihfield is allergic to nsaids and erythromycin.  [place]  Drug: Ms. Ashlock  reports no history of drug use. Alcohol:  reports no history of alcohol use. Tobacco:  reports that she has never smoked. She has never used smokeless tobacco. Medical:  has a past medical history of Acute anxiety (03/06/2015), Allergy, Anxiety, Degenerative joint disease of knee, Depression, Fibromyalgia, Obesity, Osteopenia, and RAD (reactive airway disease). Surgical: Ms. Sano  has a past surgical history that includes Parotidectomy (Left); Appendectomy; Cholecystectomy; Abdominal hysterectomy; Tonsillectomy; Joint replacement (2014); Eye surgery; and Breast cyst aspiration (Right, 04/18/2013). Family: family history includes Alzheimer's disease in her father; Cancer in her sister; Diabetes in her mother; Heart disease in her mother; Parkinson's disease in her father; Stroke in her mother.  Constitutional Exam  General appearance: Well nourished, well developed, and well hydrated. In no apparent acute distress Vitals:   09/05/18 1259  BP: (!) 141/50  Pulse: 82  Resp: 16  Temp: 98 F (36.7 C)  TempSrc: Oral  SpO2: 97%  Weight: 214 lb (97.1 kg)  Height: '5\' 2"'$  (1.575 m)   BMI Assessment: Estimated body mass index is 39.14 kg/m as calculated from the following:   Height as of this encounter: '5\' 2"'$  (1.575 m).   Weight as of this encounter: 214 lb (97.1 kg).  BMI interpretation table: BMI level Category Range association with higher incidence of chronic pain  <18 kg/m2 Underweight   18.5-24.9 kg/m2 Ideal body weight   25-29.9 kg/m2 Overweight Increased incidence by 20%  30-34.9 kg/m2 Obese (Class I) Increased incidence by 68%  35-39.9  kg/m2 Severe obesity (Class II) Increased incidence by 136%  >40 kg/m2 Extreme obesity (Class III) Increased incidence by 254%   Patient's current BMI Ideal Body weight  Body mass index is 39.14 kg/m. Ideal body weight: 50.1 kg (110 lb 7.2 oz) Adjusted ideal body weight: 68.9 kg (151 lb 13.9 oz)   BMI Readings from Last 4 Encounters:  09/05/18 39.14 kg/m  08/08/18 41.93 kg/m  06/14/18 42.65 kg/m  05/09/18 37.93 kg/m   Wt Readings from Last 4 Encounters:  09/05/18 214 lb (97.1 kg)  08/08/18 214 lb  11.2 oz (97.4 kg)  06/14/18 218 lb 6.4 oz (99.1 kg)  05/09/18 221 lb (100.2 kg)  Psych/Mental status: Alert, oriented x 3 (person, place, & time)       Eyes: PERLA Respiratory: No evidence of acute respiratory distress  Cervical Spine Area Exam  Skin & Axial Inspection: No masses, redness, edema, swelling, or associated skin lesions Alignment: Symmetrical Functional ROM: Unrestricted ROM      Stability: No instability detected Muscle Tone/Strength: Functionally intact. No obvious neuro-muscular anomalies detected. Sensory (Neurological): Unimpaired Palpation: No palpable anomalies              Upper Extremity (UE) Exam    Side: Right upper extremity  Side: Left upper extremity  Skin & Extremity Inspection: Skin color, temperature, and hair growth are WNL. No peripheral edema or cyanosis. No masses, redness, swelling, asymmetry, or associated skin lesions. No contractures.  Skin & Extremity Inspection: Skin color, temperature, and hair growth are WNL. No peripheral edema or cyanosis. No masses, redness, swelling, asymmetry, or associated skin lesions. No contractures.  Functional ROM: Unrestricted ROM          Functional ROM: Unrestricted ROM          Muscle Tone/Strength: Functionally intact. No obvious neuro-muscular anomalies detected.  Muscle Tone/Strength: Functionally intact. No obvious neuro-muscular anomalies detected.  Sensory (Neurological): Unimpaired          Sensory  (Neurological): Unimpaired          Palpation: No palpable anomalies              Palpation: No palpable anomalies              Provocative Test(s):  Phalen's test: deferred Tinel's test: deferred Apley's scratch test (touch opposite shoulder):  Action 1 (Across chest): deferred Action 2 (Overhead): deferred Action 3 (LB reach): deferred   Provocative Test(s):  Phalen's test: deferred Tinel's test: deferred Apley's scratch test (touch opposite shoulder):  Action 1 (Across chest): deferred Action 2 (Overhead): deferred Action 3 (LB reach): deferred    Thoracic Spine Area Exam  Skin & Axial Inspection: No masses, redness, or swelling Alignment: Symmetrical Functional ROM: Unrestricted ROM Stability: No instability detected Muscle Tone/Strength: Functionally intact. No obvious neuro-muscular anomalies detected. Sensory (Neurological): Unimpaired Muscle strength & Tone: No palpable anomalies  Lumbar Spine Area Exam  Skin & Axial Inspection: No masses, redness, or swelling Alignment: Symmetrical Functional ROM: Unrestricted ROM       Stability: No instability detected Muscle Tone/Strength: Functionally intact. No obvious neuro-muscular anomalies detected. Sensory (Neurological): Unimpaired Palpation: No palpable anomalies       Provocative Tests: Hyperextension/rotation test: deferred today       Lumbar quadrant test (Kemp's test): deferred today       Lateral bending test: deferred today       Patrick's Maneuver: deferred today                   FABER* test: deferred today                   S-I anterior distraction/compression test: deferred today         S-I lateral compression test: deferred today         S-I Thigh-thrust test: deferred today         S-I Gaenslen's test: deferred today         *(Flexion, ABduction and External Rotation)  Gait & Posture Assessment  Ambulation: Unassisted Gait: Relatively normal for age  and body habitus Posture: WNL   Lower Extremity  Exam    Side: Right lower extremity  Side: Left lower extremity  Stability: No instability observed          Stability: No instability observed          Skin & Extremity Inspection: Skin color, temperature, and hair growth are WNL. No peripheral edema or cyanosis. No masses, redness, swelling, asymmetry, or associated skin lesions. No contractures.  Skin & Extremity Inspection: Skin color, temperature, and hair growth are WNL. No peripheral edema or cyanosis. No masses, redness, swelling, asymmetry, or associated skin lesions. No contractures.  Functional ROM: Unrestricted ROM                  Functional ROM: Unrestricted ROM                  Muscle Tone/Strength: Functionally intact. No obvious neuro-muscular anomalies detected.  Muscle Tone/Strength: Functionally intact. No obvious neuro-muscular anomalies detected.  Sensory (Neurological): Unimpaired        Sensory (Neurological): Unimpaired        DTR: Patellar: deferred today Achilles: deferred today Plantar: deferred today  DTR: Patellar: deferred today Achilles: deferred today Plantar: deferred today  Palpation: No palpable anomalies  Palpation: No palpable anomalies   Assessment  Primary Diagnosis & Pertinent Problem List: The primary encounter diagnosis was Chronic knee pain (Primary Area of Pain) (Bilateral) (L>R). Diagnoses of Osteoarthritis of knee (Left), Chronic low back pain (Secondary Area of Pain) (Bilateral) (L>R), Chronic pain syndrome, Vitamin D deficiency, Elevated C-reactive protein (CRP), Elevated sed rate, and S/P TKR Arthroplasty (Right) were also pertinent to this visit.  Status Diagnosis  Controlled Controlled Controlled 1. Chronic knee pain (Primary Area of Pain) (Bilateral) (L>R)   2. Osteoarthritis of knee (Left)   3. Chronic low back pain (Secondary Area of Pain) (Bilateral) (L>R)   4. Chronic pain syndrome   5. Vitamin D deficiency   6. Elevated C-reactive protein (CRP)   7. Elevated sed rate   8. S/P  TKR Arthroplasty (Right)     Problems updated and reviewed during this visit: Problem  Contusion of Foot  Chronic low back pain (Secondary Area of Pain) (Bilateral) (L>R)  Chronic knee pain (Primary Area of Pain) (Bilateral) (L>R)  Osteoarthritis of knee (Left)  Vitamin D Deficiency  Elevated C-Reactive Protein (Crp)  Elevated Sed Rate  Pharmacologic Therapy  Disorder of Skeletal System  Problems Influencing Health Status  Advanced Care Planning/Counseling Discussion  Hypercholesteremia  Sinusitis (Resolved)   Plan of Care  Pharmacotherapy (Medications Ordered): Meds ordered this encounter  Medications  . oxyCODONE (OXY IR/ROXICODONE) 5 MG immediate release tablet    Sig: Take 1 tablet (5 mg total) by mouth every 8 (eight) hours as needed for severe pain.    Dispense:  90 tablet    Refill:  0    [place] STOP ACT - Not applicable. Fill one day early if pharmacy is closed on scheduled refill date. Do not fill until: 11/10/18. Must last 30 days. To last until: 12/10/18.  [name] oxyCODONE (OXY IR/ROXICODONE) 5 MG immediate release tablet    Sig: Take 1 tablet (5 mg total) by mouth every 8 (eight) hours as needed for severe pain.    Dispense:  90 tablet    Refill:  0    [place] STOP ACT - Not applicable. Fill one day early if pharmacy is closed on scheduled refill date. Do not fill until: 10/11/18. Must last 30 days.  To last until: 11/10/18.  [name] oxyCODONE (OXY IR/ROXICODONE) 5 MG immediate release tablet    Sig: Take 1 tablet (5 mg total) by mouth every 8 (eight) hours as needed for severe pain.    Dispense:  90 tablet    Refill:  0    [place] STOP ACT - Not applicable. Fill one day early if pharmacy is closed on scheduled refill date. Do not fill until: 09/11/18. Must last 30 days. To last until: 10/11/18.  [name] ergocalciferol (VITAMIN D2) 1.25 MG (50000 UT) capsule    Sig: Take 1 capsule (50,000 Units total) by mouth 2 (two) times a week. X 6 weeks.    Dispense:  12 capsule    Refill:  0    Do not add  this medication to the electronic "Automatic Refill" notification system. Patient may have prescription filled one day early if pharmacy is closed on scheduled refill date.  . Cholecalciferol (VITAMIN D3) 125 MCG (5000 UT) CAPS    Sig: Take 1 capsule (5,000 Units total) by mouth daily with breakfast. Take along with calcium and magnesium.    Dispense:  30 capsule    Refill:  5    Do not place medication on "Automatic Refill".  May substitute with similar over-the-counter product.  . Magnesium 500 MG CAPS    Sig: Take 1 capsule (500 mg total) by mouth 2 (two) times daily at 8 am and 10 pm.    Dispense:  60 capsule    Refill:  5    Do not place medication on "Automatic Refill".  The patient may use similar over-the-counter product.  . calcium carbonate (CALCIUM 600) 600 MG TABS tablet    Sig: Take 1 tablet (600 mg total) by mouth 2 (two) times daily with a meal.    Dispense:  60 tablet    Refill:  0    Do not place medication on "Automatic Refill". Fill one day early if pharmacy is closed on scheduled refill date.   Medications administered today: Grayling Congress had no medications administered during this visit.   Procedure Orders     KNEE INJECTION  Lab Orders     ANA w/Reflex if Positive     Rheumatoid factor Imaging Orders  No imaging studies ordered today   Referral Orders  No referral(s) requested today   Interventional management options: Planned, scheduled, and/or pending:   Diagnostic left intra-articular knee joint injection #2   No further increases on opioid analgesics.  We will be sending a letter to her PCP to see if he can assist in getting her off of the alprazolam, as suggested by CDC guidelines. The patient indicates that the cream that was ordered by Dionisio David, NP, did not work and therefore when it runs out we will renew it. All medication managements will be continue with Lanell Persons, NP, under my supervision.   Considering:   Diagnostic right  Genicular nerve block #2(90/90/50) Possible right Genicular nerve RFA  Diagnosticleft intra-articular knee joint injection #2(100/100/90) Possible series of 5 left intra-articular Hyalgan knee injections Diagnostic left Genicular nerve block Possibleleft Genicular nerve RFA Diagnostic bilateral lumbar facet block #2 Possible bilateral lumbar facet RFA Diagnostic left lumbar epidural steroid injection Diagnostic caudal epidural steroid injection + epidurogram Possible Racz procedure Diagnostic bilateral cervical facet block Possible bilateral cervical facet RFA Diagnostic right versus left cervical epidural steroid injection Diagnostic bilateral intra-articular shoulder joint injection Diagnostic bilateral suprascapular nerve block Possible bilateral suprascapular nerve RFA   Palliative PRN  treatment(s):   PalliativebilateralGenicular nerve block Palliativeleft intra-articular knee joint injection Palliativebilateral lumbar facet block  Palliativeleft lumbar epidural steroid injection  Palliativecaudal epiduralsteroid injection + epidurogram  Palliativebilateral cervical facet block Palliativeright versus left cervical epidural steroid injection  Palliativebilateral intra-articular shoulderjoint injection  Palliativebilateral suprascapularnerve block    Provider-requested follow-up: Return for Procedure (no sedation): (L) IA Knee inj. Med-Mgmt, w/ Dionisio David, NP.  Future Appointments  Date Time Provider [place]  09/15/2018  1:00 PM Guadalupe Maple, MD [facility]  12/19/2018  1:30 PM Guadalupe Maple, MD [facility]  04/12/2019  9:30 AM CFP NURSE HEALTH ADVISOR [facility] [place]   Primary Care Physician: Guadalupe Maple, MD Location: [facility] Note by: Gaspar Cola, MD Date: 09/05/2018; Time: 2:11 PM

## 2018-09-05 ENCOUNTER — Ambulatory Visit: Payer: Medicare HMO | Attending: Pain Medicine | Admitting: Pain Medicine

## 2018-09-05 ENCOUNTER — Encounter: Payer: Medicare Other | Admitting: Family Medicine

## 2018-09-05 ENCOUNTER — Encounter: Payer: Self-pay | Admitting: Pain Medicine

## 2018-09-05 VITALS — BP 141/50 | HR 82 | Temp 98.0°F | Resp 16 | Ht 62.0 in | Wt 214.0 lb

## 2018-09-05 DIAGNOSIS — M25561 Pain in right knee: Secondary | ICD-10-CM | POA: Diagnosis not present

## 2018-09-05 DIAGNOSIS — Z96651 Presence of right artificial knee joint: Secondary | ICD-10-CM | POA: Insufficient documentation

## 2018-09-05 DIAGNOSIS — R700 Elevated erythrocyte sedimentation rate: Secondary | ICD-10-CM | POA: Insufficient documentation

## 2018-09-05 DIAGNOSIS — E559 Vitamin D deficiency, unspecified: Secondary | ICD-10-CM | POA: Diagnosis not present

## 2018-09-05 DIAGNOSIS — R7982 Elevated C-reactive protein (CRP): Secondary | ICD-10-CM | POA: Diagnosis not present

## 2018-09-05 DIAGNOSIS — M545 Low back pain, unspecified: Secondary | ICD-10-CM

## 2018-09-05 DIAGNOSIS — G8929 Other chronic pain: Secondary | ICD-10-CM | POA: Insufficient documentation

## 2018-09-05 DIAGNOSIS — M25562 Pain in left knee: Secondary | ICD-10-CM | POA: Diagnosis not present

## 2018-09-05 DIAGNOSIS — M1712 Unilateral primary osteoarthritis, left knee: Secondary | ICD-10-CM | POA: Diagnosis not present

## 2018-09-05 DIAGNOSIS — G894 Chronic pain syndrome: Secondary | ICD-10-CM | POA: Insufficient documentation

## 2018-09-05 MED ORDER — OXYCODONE HCL 5 MG PO TABS: 5.0000 mg | ORAL_TABLET | Freq: Three times a day (TID) | ORAL | 0 refills | Status: DC | PRN

## 2018-09-05 MED ORDER — ERGOCALCIFEROL 1.25 MG (50000 UT) PO CAPS: 50000.0000 [IU] | ORAL_CAPSULE | ORAL | 0 refills | Status: AC

## 2018-09-05 MED ORDER — CALCIUM CARBONATE 600 MG PO TABS: 600.0000 mg | ORAL_TABLET | Freq: Two times a day (BID) | ORAL | 0 refills | Status: DC

## 2018-09-05 MED ORDER — MAGNESIUM 500 MG PO CAPS: 500.0000 mg | ORAL_CAPSULE | Freq: Two times a day (BID) | ORAL | 5 refills | Status: DC

## 2018-09-05 MED ORDER — VITAMIN D3 125 MCG (5000 UT) PO CAPS: 1.0000 | ORAL_CAPSULE | Freq: Every day | ORAL | 5 refills | Status: AC

## 2018-09-05 NOTE — Progress Notes (Signed)
Nursing Pain Medication Assessment:  Safety precautions to be maintained throughout the outpatient stay will include: orient to surroundings, keep bed in low position, maintain call bell within reach at all times, provide assistance with transfer out of bed and ambulation.  Medication Inspection Compliance: Pill count conducted under aseptic conditions, in front of the patient. Neither the pills nor the bottle was removed from the patient's sight at any time. Once count was completed pills were immediately returned to the patient in their original bottle.  Medication: Oxycodone IR Pill/Patch Count: 23 of 90 pills remain Pill/Patch Appearance: Markings consistent with prescribed medication Bottle Appearance: Standard pharmacy container. Clearly labeled. Filled Date: 1 / 13 / 2019 Last Medication intake:  Today

## 2018-09-05 NOTE — Patient Instructions (Addendum)
Calcium 600 mg  Vitamin D3 5000U and Vitamin D2 50,000U  Magnesium 500 mg 1 capsule 2 times daily 8a.m. and 10p.m.  Oxycodone 5 mg x 3 months to be filled on 09/11/18, 10/11/18 and 11/10/18 escribed to pharmacy     Preparing for your procedure (without sedation)  Instructions: . Oral Intake: Do not eat or drink anything for at least 3 hours prior to your procedure. . Transportation: Unless otherwise stated by your physician, you may drive yourself after the procedure. . Blood Pressure Medicine: Take your blood pressure medicine with a sip of water the morning of the procedure. . Blood thinners: Notify our staff if you are taking any blood thinners. Depending on which one you take, there will be specific instructions on how and when to stop it. . Diabetics on insulin: Notify the staff so that you can be scheduled 1st case in the morning. If your diabetes requires high dose insulin, take only  of your normal insulin dose the morning of the procedure and notify the staff that you have done so. . Preventing infections: Shower with an antibacterial soap the morning of your procedure.  . Build-up your immune system: Take 1000 mg of Vitamin C with every meal (3 times a day) the day prior to your procedure. [name]. Antibiotics: Inform the staff if you have a condition or reason that requires you to take antibiotics before dental procedures. . Pregnancy: If you are pregnant, call and cancel the procedure. . Sickness: If you have a cold, fever, or any active infections, call and cancel the procedure. . Arrival: You must be in the facility at least 30 minutes prior to your scheduled procedure. . Children: Do not bring any children with you. . Dress appropriately: Bring dark clothing that you would not mind if they get stained. . Valuables: Do not bring any jewelry or valuables.  Procedure appointments are reserved for interventional treatments only. [name]. No Prescription Refills. . No medication changes will  be discussed during procedure appointments. . No disability issues will be discussed.  Reasons to call and reschedule or cancel your procedure: (Following these recommendations will minimize the risk of a serious complication.) . Surgeries: Avoid having procedures within 2 weeks of any surgery. (Avoid for 2 weeks before or after any surgery). . Flu Shots: Avoid having procedures within 2 weeks of a flu shots or . (Avoid for 2 weeks before or after immunizations). . Barium: Avoid having a procedure within 7-10 days after having had a radiological study involving the use of radiological contrast. (Myelograms, Barium swallow or enema study). . Heart attacks: Avoid any elective procedures or surgeries for the initial 6 months after a "Myocardial Infarction" (Heart Attack). . Blood thinners: It is imperative that you stop these medications before procedures. Let us know if you if you take any blood thinner.  . Infection: Avoid procedures during or within two weeks of an infection (including chest colds or gastrointestinal problems). Symptoms associated with infections include: Localized redness, fever, chills, night sweats or profuse sweating, burning sensation when voiding, cough, congestion, stuffiness, runny nose, sore throat, diarrhea, nausea, vomiting, cold or Flu symptoms, recent or current infections. It is specially important if the infection is over the area that we intend to treat. [name]. Heart and lung problems: Symptoms that may suggest an active cardiopulmonary problem include: cough, chest pain, breathing difficulties or shortness of breath, dizziness, ankle swelling, uncontrolled high or unusually low blood pressure, and/or palpitations. If you are experiencing any of these symptoms,  cancel your procedure and contact your primary care physician for an evaluation.  Remember:  Regular Business hours are:  Monday to Thursday 8:00 AM to 4:00 PM  Provider's Schedule: Delano Metz, MD:   Procedure days: Tuesday and Thursday 7:30 AM to 4:00 PM  Edward Jolly, MD:  Procedure days: Monday and Wednesday 7:30 AM to 4:00 PM ____________________________________________________________________________________________    ______________________________________________________________________________________________  Specialty Pain Scale  Introduction:  There are significant differences in how pain is reported. The word pain usually refers to physical pain, but it is also a common synonym of suffering. The medical community uses a scale from 0 (zero) to 10 (ten) to report pain level. Zero (0) is described as "no pain", while ten (10) is described as "the worse pain you can imagine". The problem with this scale is that physical pain is reported along with suffering. Suffering refers to mental pain, or more often yet it refers to any unpleasant feeling, emotion or aversion associated with the perception of harm or threat of harm. It is the psychological component of pain.  Pain Specialists prefer to separate the two components. The pain scale used by this practice is the Verbal Numerical Rating Scale (VNRS-11). This scale is for the physical pain only. DO NOT INCLUDE how your pain psychologically affects you. This scale is for adults 92 years of age and older. It has 11 (eleven) levels. The 1st level is 0/10. This means: "right now, I have no pain". In the context of pain management, it also means: "right now, my physical pain is under control with the current therapy".  General Information:  The scale should reflect your current level of pain. Unless you are specifically asked for the level of your worst pain, or your average pain. If you are asked for one of these two, then it should be understood that it is over the past 24 hours.  Levels 1 (one) through 5 (five) are described below, and can be treated as an outpatient. Ambulatory pain management facilities such as ours are more than  adequate to treat these levels. Levels 6 (six) through 10 (ten) are also described below, however, these must be treated as a hospitalized patient. While levels 6 (six) and 7 (seven) may be evaluated at an urgent care facility, levels 8 (eight) through 10 (ten) constitute medical emergencies and as such, they belong in a hospital's emergency department. When having these levels (as described below), do not come to our office. Our facility is not equipped to manage these levels. Go directly to an urgent care facility or an emergency department to be evaluated.  Definitions:  Activities of Daily Living (ADL): Activities of daily living (ADL or ADLs) is a term used in healthcare to refer to people's daily self-care activities. Health professionals often use a person's ability or inability to perform ADLs as a measurement of their functional status, particularly in regard to people post injury, with disabilities and the elderly. There are two ADL levels: Basic and Instrumental. Basic Activities of Daily Living (BADL  or BADLs) consist of self-care tasks that include: Bathing and showering; personal hygiene and grooming (including brushing/combing/styling hair); dressing; Toilet hygiene (getting to the toilet, cleaning oneself, and getting back up); eating and self-feeding (not including cooking or chewing and swallowing); functional mobility, often referred to as "transferring", as measured by the ability to walk, get in and out of bed, and get into and out of a chair; the broader definition (moving from one place to another while performing  activities) is useful for people with different physical abilities who are still able to get around independently. Basic ADLs include the things many people do when they get up in the morning and get ready to go out of the house: get out of bed, go to the toilet, bathe, dress, groom, and eat. On the average, loss of function typically follows a particular order. Hygiene is the  first to go, followed by loss of toilet use and locomotion. The last to go is the ability to eat. When there is only one remaining area in which the person is independent, there is a 62.9% chance that it is eating and only a 3.5% chance that it is hygiene. Instrumental Activities of Daily Living (IADL or IADLs) are not necessary for fundamental functioning, but they let an individual live independently in a community. IADL consist of tasks that include: cleaning and maintaining the house; home establishment and maintenance; care of others (including selecting and supervising caregivers); care of pets; child rearing; managing money; managing financials (investments, etc.); meal preparation and cleanup; shopping for groceries and necessities; moving within the community; safety procedures and emergency responses; health management and maintenance (taking prescribed medications); and using the telephone or other form of communication.  Instructions:  Most patients tend to report their pain as a combination of two factors, their physical pain and their psychosocial pain. This last one is also known as "suffering" and it is reflection of how physical pain affects you socially and psychologically. From now on, report them separately.  From this point on, when asked to report your pain level, report only your physical pain. Use the following table for reference.  Pain Clinic Pain Levels (0-5/10)  Pain Level Score  Description  No Pain 0   Mild pain 1 Nagging, annoying, but does not interfere with basic activities of daily living (ADL). Patients are able to eat, bathe, get dressed, toileting (being able to get on and off the toilet and perform personal hygiene functions), transfer (move in and out of bed or a chair without assistance), and maintain continence (able to control bladder and bowel functions). Blood pressure and heart rate are unaffected. A normal heart rate for a healthy adult ranges from 60 to 100  bpm (beats per minute).   Mild to moderate pain 2 Noticeable and distracting. Impossible to hide from other people. More frequent flare-ups. Still possible to adapt and function close to normal. It can be very annoying and may have occasional stronger flare-ups. With discipline, patients may get used to it and adapt.   Moderate pain 3 Interferes significantly with activities of daily living (ADL). It becomes difficult to feed, bathe, get dressed, get on and off the toilet or to perform personal hygiene functions. Difficult to get in and out of bed or a chair without assistance. Very distracting. With effort, it can be ignored when deeply involved in activities.   Moderately severe pain 4 Impossible to ignore for more than a few minutes. With effort, patients may still be able to manage work or participate in some social activities. Very difficult to concentrate. Signs of autonomic nervous system discharge are evident: dilated pupils (mydriasis); mild sweating (diaphoresis); sleep interference. Heart rate becomes elevated (>115 bpm). Diastolic blood pressure (lower number) rises above 100 mmHg. Patients find relief in laying down and not moving.   Severe pain 5 Intense and extremely unpleasant. Associated with frowning face and frequent crying. Pain overwhelms the senses.  Ability to do any activity or maintain  social relationships becomes significantly limited. Conversation becomes difficult. Pacing back and forth is common, as getting into a comfortable position is nearly impossible. Pain wakes you up from deep sleep. Physical signs will be obvious: pupillary dilation; increased sweating; goosebumps; brisk reflexes; cold, clammy hands and feet; nausea, vomiting or dry heaves; loss of appetite; significant sleep disturbance with inability to fall asleep or to remain asleep. When persistent, significant weight loss is observed due to the complete loss of appetite and sleep deprivation.  Blood pressure and  heart rate becomes significantly elevated. Caution: If elevated blood pressure triggers a pounding headache associated with blurred vision, then the patient should immediately seek attention at an urgent or emergency care unit, as these may be signs of an impending stroke.    Emergency Department Pain Levels (6-10/10)  Emergency Room Pain 6 Severely limiting. Requires emergency care and should not be seen or managed at an outpatient pain management facility. Communication becomes difficult and requires great effort. Assistance to reach the emergency department may be required. Facial flushing and profuse sweating along with potentially dangerous increases in heart rate and blood pressure will be evident.   Distressing pain 7 Self-care is very difficult. Assistance is required to transport, or use restroom. Assistance to reach the emergency department will be required. Tasks requiring coordination, such as bathing and getting dressed become very difficult.   Disabling pain 8 Self-care is no longer possible. At this level, pain is disabling. The individual is unable to do even the most "basic" activities such as walking, eating, bathing, dressing, transferring to a bed, or toileting. Fine motor skills are lost. It is difficult to think clearly.   Incapacitating pain 9 Pain becomes incapacitating. Thought processing is no longer possible. Difficult to remember your own name. Control of movement and coordination are lost.   The worst pain imaginable 10 At this level, most patients pass out from pain. When this level is reached, collapse of the autonomic nervous system occurs, leading to a sudden drop in blood pressure and heart rate. This in turn results in a temporary and dramatic drop in blood flow to the brain, leading to a loss of consciousness. Fainting is one of the body's self defense mechanisms. Passing out puts the brain in a calmed state and causes it to shut down for a while, in order to begin  the healing process.    Summary: 1. Refer to this scale when providing [place] with your pain level. 2. Be accurate and careful when reporting your pain level. This will help with your care. 3. Over-reporting your pain level will lead to loss of credibility. 4. Even a level of 1/10 means that there is pain and will be treated at our facility. 5. High, inaccurate reporting will be documented as "Symptom Exaggeration", leading to loss of credibility and suspicions of possible secondary gains such as obtaining more narcotics, or wanting to appear disabled, for fraudulent reasons. 6. Only pain levels of 5 or below will be seen at our facility. 7. Pain levels of 6 and above will be sent to the Emergency Department and the appointment cancelled. ______________________________________________________________________________________________   ____________________________________________________________________________________________  Weight Management Required  URGENT: Your weight has been found to be adversely affecting your health.  Dear Ms. Townsend Roger:  Your current Body mass index is 39.14 kg/m.[name] Estimated body mass index is 39.14 kg/m as calculated from the following:   Height as of this encounter:  (1.575 m).   Weight as of this encounter: 214 lb (  97.1 kg).  Your last four (4) weight and BMI calculations are as follows: Wt Readings from Last 4 Encounters:  09/05/18 214 lb (97.1 kg)  08/08/18 214 lb 11.2 oz (97.4 kg)  06/14/18 218 lb 6.4 oz (99.1 kg)  05/09/18 221 lb (100.2 kg)   BMI Readings from Last 4 Encounters:  09/05/18 39.14 kg/m  08/08/18 41.93 kg/m  06/14/18 42.65 kg/m  05/09/18 37.93 kg/m    Calculations estimate your ideal body weight to be: Ideal body weight: 50.1 kg (110 lb 7.2 oz) Adjusted ideal body weight: 68.9 kg (151 lb 13.9 oz)  Please use the table below to identify your weight category and associated incidence of chronic pain, secondary to your  weight.  BMI interpretation table: BMI level Category Associated incidence of chronic pain  <18 kg/m2 Underweight   18.5-24.9 kg/m2 Ideal body weight   25-29.9 kg/m2 Overweight  20%  30-34.9 kg/m2 Obese (Class I)  68%  35-39.9 kg/m2 Severe obesity (Class II)  136%  >40 kg/m2 Extreme obesity (Class III)  254%   In addition: You will be considered "Morbidly Obese", if your BMI is above 30 and you have one or more of the following conditions that are directly associated with obesity: 8. Type 2 Diabetes (Which in turn can lead to cardiovascular diseases (CVD), stroke, peripheral vascular diseases (PVD), retinopathy, nephropathy, and neuropathy) 9. Cardiovascular Disease  10. Breathing problems (Asthma, obesity-hypoventilation syndrome, obstructive sleep apnea, chronic inflammatory airway disease) 11. Chronic kidney disease 12. Liver disease (nonalcoholic fatty liver disease) 13. High blood pressure 14. Acid reflux (gastroesophageal reflux disease) 15. Osteoarthritis (OA) 16. Low back pain (Lumbar Facet Syndrome) 17. Hip pain (Osteoarthritis of hip) 18. Knee pain (Osteoarthritis of knee) (patients with a BMI>30 kg/m2 were 6.8 times more likely to develop knee OA than normal-weight individuals) 19. Certain types of cancer. (Epidemiological studies have shown that obesity is a risk factor for: post-menopausal breast cancer; cancers of the endometrium, colon and kidney; malignant adenomas of the oesophagus. Obese subjects have an approximately 1.5-3.5-fold increased risk of developing these cancers compared with normal-weight subjects, and it has been estimated that between 15 and 45% of these cancers can be attributed to overweight. More recent studies suggest that obesity may also increase the risk of other types of cancer, including pancreatic, hepatic and gallbladder cancer. Ref: Obesity and cancer. Pischon T, Nthlings U, [employer] H. Proc Nutr Soc. 2008 May;67(2):128-45. doi:  10.1017/[id number].)  Recommendation: At this point it is urgent that you take a step back and concentrate in loosing weight. Because most chronic pain patients do have difficulty exercising secondary to their pain, you must rely on proper nutrition and dieting in order to lose the weight. If your BMI is above 40, you should seriously consider bariatric surgery. A realistic goal is to lose 10% of your body weight over a period of 12 months.  If over time you have unsuccessfully try to lose weight, then it is time for you to seek professional help and to enter a medically supervised weight management program.  Pain management considerations:  1. Pharmacological Problems: Be advised that the use of opioid analgesics has been associated with decreased metabolism and weight gain.  For this reason, should we see that you are unable to lose weight while taking these medications, it may become necessary for [place] to taper down and indefinitely discontinue these medicines.  2. Technical Problems: The incidence of successful interventional therapies decreases as the patient's BMI increases. It is much more difficult to  accomplish a safe and effective interventional therapy on a patient with a BMI above 35. Yours is Body mass index is 39.14 kg/m.[name]. 3. Radiation Exposure Problems: The x-rays machine, used to accomplish injection therapies, will automatically increase their x-ray output in order to capture an appropriate bone image. This means that radiation exposure increases exponentially with the patient's BMI. (The higher the BMI, the higher the radiation exposure.) Although the level of radiation used at a given time is still safe to the patient, it is not for the physician and/or assisting staff. Unfortunately, radiation exposure is accumulative. Because physicians and the staff have to do procedures and be exposed on a daily basis, this can result in health problems such as cancer and radiation burns.  Radiation exposure to the staff is monitored by the radiation batches that they wear. The exposure levels are reported back to the staff on a quarterly basis. Depending on levels of exposure, physicians and staff may be obligated by law to decrease this exposure. This means that they have the right and obligation to refuse providing therapies where they may be overexposed to radiation. For this reason, physicians may decline to offer therapies such as radiofrequency ablation or implants to patients with a BMI above 40. ____________________________________________________________________________________________  ____________________________________________________________________________________________  Genicular Nerve Block  What is a genicular nerve block? A genicular nerve block is the injection of a local anesthetic to block the nerves that transmits pain from the knee.  What is the purpose of a facet nerve block? A genicular nerve block is a diagnostic procedure to determine if the pathologic changes (i.e. arthritis, meniscal tears, etc) and inflammation within the knee joint is the source of your knee pain. It also confirms that the knee pain will respond well to the actual treatment procedure. If a genicular nerve block works, it will give you relief for several hours. After that, the pain is expected to return to normal. This test is always performed twice (usually a week or two apart) because two successful tests are required to move onto treatment. If both diagnostic tests are positive, then we schedule a treatment called radiofrequency (RF) ablation. In this procedure, the same nerves are cauterized, which typically leads to pain relief for 4 -18 months. If this process works well for one knee, it can be performed on the other knee if needed.  How is the procedure performed? You will be placed on the procedure table. The injection site is sterilized with either iodine or chlorhexadine. The  site to be injected is numbed with a local anesthetic, and a needle is directed to the target area. X-ray guidance is used to ensure proper placement and positioning of the needle. When the needle is properly positioned near the genicular nerve, local anesthetic is injected to numb that nerve. This will be repeated at multiple sites around the knee to block all genicular nerves.  Will the procedure be painful? The injection can be painful and we therefore provide the option of receiving IV sedation. IV sedation, combined with local anesthetic, can make the injection nearly pain free. It allows you to remain very still during the procedure, which can also make the injection easier, faster, and more successful. If you decide to have IV sedation, you must have a driver to get you home safely afterwards. In addition, you cannot have anything to eat or drink within 8 hours of your appointment (clear liquids are allowed until 3 hours before the procedure). If you take medications for diabetes, these  medications may need to be adjusted the morning of the procedure. Your primary care physician can help you with this adjustment.  What are the discharge instructions? If you received IV sedation do not drive or operate machinery for at least 24 hours after the procedure. You may return to work the next day following your procedure. You may resume your normal diet immediately. Do not engage in any strenuous activity for 24 hours. You should, however, engage in moderate activity that typically causes your ususal pain. If the block works, those activities should not be painful for several hours after the injection. Do not take a bath, swim, or use a hot tub for 24 hours (you may take a shower). Call the office if you have any of the following: severe pain afterwards (different than your usual symptoms), redness/swelling/discharge at the injection site(s), fevers/chills, difficulty with bowel or bladder functions.  What  are the risks and side effects? The complication rate for this procedure is very low. Whenever a needle enters the skin, bleeding or infection can occur. Some other serious but extremely rare risks include paralysis and death. You may have an allergic reaction to any of the medications used. If you have a known allergy to any medications, especially local anesthetics, notify our staff before the procedure takes place. You may experience any of the following side effects up to 4 - 6 hours after the procedure: . Leg muscle weakness or numbness may occur due to the local anesthetic affecting the nerves that control your legs (this is a temporary affect and it is not paralysis). If you have any leg weakness or numbness, walk only with assistance in order to prevent falls and injury. Your leg strength will return slowly and completely. . Dizziness may occur due to a decrease in your blood pressure. If this occurs, remain in a seated or lying position. Gradually sit up, and then stand after at least 10 minutes of sitting. . Mild headaches may occur. Drink fluids and take pain medications if needed. If the headaches persist or become severe, call the office. . Mild discomfort at the injection site can occur. This typically lasts for a few hours but can persist for a couple days. If this occurs, take anti-inflammatories or pain medications, apply ice to the area the day of the procedure. If it persists, apply moist heat in the day(s) following.  The side effects listed above can be normal. They are not dangerous and will resolve on their own. If, however, you experience any of the following, a complication may have occurred and you should either contact your doctor. If he is not readily available, then you should proceed to the closest urgent care center for evaluation: . Severe or progressive pain at the injection site(s) . Arm or leg weakness that progressively worsens or persists for longer than 8  hours . Severe or progressive redness, swelling, or discharge from the injections site(s) . Fevers, chills, nausea, or vomiting . Bowel or bladder dysfunction (i.e. inability to urinate or pass stool or difficulty controlling either)  How long does it take for the procedure to work? You should feel relief from your usual pain within the first hour. Again, this is only expected to last for several hours, at the most. Remember, you may be sore in the middle part of your back from the needles, and you must distinguish this from your usual pain. ____________________________________________________________________________________________

## 2018-09-15 ENCOUNTER — Ambulatory Visit (INDEPENDENT_AMBULATORY_CARE_PROVIDER_SITE_OTHER): Payer: Medicare HMO | Admitting: Family Medicine

## 2018-09-15 ENCOUNTER — Other Ambulatory Visit: Payer: Self-pay | Admitting: Family Medicine

## 2018-09-15 ENCOUNTER — Encounter: Payer: Self-pay | Admitting: Family Medicine

## 2018-09-15 DIAGNOSIS — E7800 Pure hypercholesterolemia, unspecified: Secondary | ICD-10-CM

## 2018-09-15 DIAGNOSIS — E039 Hypothyroidism, unspecified: Secondary | ICD-10-CM | POA: Diagnosis not present

## 2018-09-15 DIAGNOSIS — E118 Type 2 diabetes mellitus with unspecified complications: Secondary | ICD-10-CM

## 2018-09-15 MED ORDER — NYSTATIN 100000 UNIT/GM EX POWD: CUTANEOUS | 2 refills | Status: DC

## 2018-09-15 MED ORDER — LEVOTHYROXINE SODIUM 50 MCG PO TABS: 50.0000 ug | ORAL_TABLET | Freq: Every day | ORAL | 2 refills | Status: DC

## 2018-09-15 MED ORDER — ATORVASTATIN CALCIUM 10 MG PO TABS: 10.0000 mg | ORAL_TABLET | Freq: Every day | ORAL | 3 refills | Status: DC

## 2018-09-15 NOTE — Assessment & Plan Note (Signed)
Discussed hypercholesterol and along with diabetes will go ahead and start low-dose atorvastatin

## 2018-09-15 NOTE — Progress Notes (Signed)
BP 138/80 (BP Location: Left Arm)   Pulse 79   Ht 4' 10.9" (1.496 m)   Wt 215 lb 4 oz (97.6 kg)   SpO2 96%   BMI 43.63 kg/m    Subjective:    Patient ID: Cynthia Jones, female    DOB: [birth year], 78 y.o.   MRN: [id number]  HPI: Cynthia Jones is a 78 y.o. female F/u med check  Patient followed by pain clinic for chronic pain which is diffuse throughout her body.  She takes oxycodone 5 mg sometimes takes an extra which is been prescribed and endorsed by the pain clinic. Discussed Xanax use patient has low dose and use it rarely and not with extra oxycodone. Takes duloxetine for depression and pain and feels it may be doing okay.  Blood pressure doing okay slightly elevated at first but all in all doing okay. Patient on review TSH has been elevated patient not taking any thyroid medications. Hemoglobin A1c has been stable at 6.6. LDL has been elevated at 103.  Taken 3 months ago. Reviewed blood work done by pain clinic indicating CRP and sed rate markedly elevated along with low vitamin levels patient is taking vitamin supplements.  Relevant past medical, surgical, family and social history reviewed and updated as indicated. Interim medical history since our last visit reviewed. Allergies and medications reviewed and updated.  Review of Systems  Constitutional: Negative.   HENT: Negative.   Eyes: Negative.   Respiratory: Negative.   Cardiovascular: Negative.   Gastrointestinal: Negative.   Endocrine: Negative.   Genitourinary: Negative.   Musculoskeletal: Negative.   Skin: Negative.   Allergic/Immunologic: Negative.   Neurological: Negative.   Hematological: Negative.   Psychiatric/Behavioral: Negative.     Per HPI unless specifically indicated above     Objective:    BP 138/80 (BP Location: Left Arm)   Pulse 79   Ht 4' 10.9" (1.496 m)   Wt 215 lb 4 oz (97.6 kg)   SpO2 96%   BMI 43.63 kg/m   Wt Readings from Last 3 Encounters:  09/15/18 215 lb 4 oz  (97.6 kg)  09/05/18 214 lb (97.1 kg)  08/08/18 214 lb 11.2 oz (97.4 kg)    Physical Exam Constitutional:      Appearance: She is well-developed.  HENT:     Head: Normocephalic and atraumatic.     Right Ear: External ear normal.     Left Ear: External ear normal.     Nose: Nose normal.  Eyes:     Conjunctiva/sclera: Conjunctivae normal.     Pupils: Pupils are equal, round, and reactive to light.  Neck:     Musculoskeletal: Normal range of motion and neck supple.     Vascular: No carotid bruit.  Cardiovascular:     Rate and Rhythm: Normal rate and regular rhythm.     Heart sounds: Normal heart sounds. No murmur.  Pulmonary:     Effort: Pulmonary effort is normal.     Breath sounds: Normal breath sounds.  Abdominal:     General: Bowel sounds are normal.     Palpations: Abdomen is soft.  Musculoskeletal: Normal range of motion.  Skin:    Findings: No rash.  Neurological:     Mental Status: She is alert and oriented to person, place, and time.  Psychiatric:        Behavior: Behavior normal.        Thought Content: Thought content normal.        Judgment:  Judgment normal.     Results for orders placed or performed in visit on 08/08/18  Sedimentation rate  Result Value Ref Range   Sed Rate 94 (H) 0 - 40 mm/hr  Magnesium  Result Value Ref Range   Magnesium 2.0 1.6 - 2.3 mg/dL  Vitamin L41  Result Value Ref Range   Vitamin B-12 406 232 - 1,245 pg/mL  C-reactive protein  Result Value Ref Range   CRP 17 (H) 0 - 10 mg/L  25-Hydroxyvitamin D Lcms D2+D3  Result Value Ref Range   25-Hydroxy, Vitamin D 20 (L) ng/mL   25-Hydroxy, Vitamin D-2 <1.0 ng/mL   25-Hydroxy, Vitamin D-3 20 ng/mL      Assessment & Plan:   Problem List Items Addressed This Visit      Endocrine   Hypothyroid    Reviewed patient's previous TSH labs which are elevated with patient's multiple problems will go ahead and treat for hypothyroid starting levothyroxine 50 mcg once a day.      Relevant  Medications   levothyroxine (SYNTHROID, LEVOTHROID) 50 MCG tablet     Other   Hypercholesteremia    Discussed hypercholesterol and along with diabetes will go ahead and start low-dose atorvastatin      Relevant Medications   atorvastatin (LIPITOR) 10 MG tablet    Discussed chronic pain and may be helped by doing better with thyroid.  Follow up plan: Return in about 3 months (around 12/15/2018) for BMP,  Lipids, ALT, AST, Hemoglobin A1c, TSH.

## 2018-09-15 NOTE — Assessment & Plan Note (Signed)
Reviewed patient's previous TSH labs which are elevated with patient's multiple problems will go ahead and treat for hypothyroid starting levothyroxine 50 mcg once a day.

## 2018-09-15 NOTE — Telephone Encounter (Signed)
Requested medication (s) are due for refill today: yes  Requested medication (s) are on the active medication list: yes  Last refill:  05/20/18  Future visit scheduled: yes  Notes to clinic:  Pt called [facility] and stated that she had OV with Dr Dossie Arbour today and forgot to refill   Requested Prescriptions  Pending Prescriptions Disp Refills   nystatin (MYCOSTATIN/NYSTOP) powder 60 g 2     Off-Protocol Failed - 09/15/2018  3:42 PM      Failed - Medication not assigned to a protocol, review manually.      Passed - Valid encounter within last 12 months    Recent Outpatient Visits          Today Hypothyroidism, unspecified type   [facility] Cr[facility]man, Redge Gainer, MD   3 months ago Diabetes mellitus without complication [facility])   [facility] Crissman, Redge Gainer, MD   6 months ago Contusion of left foot, initial encounter   [facility] Crissman, Redge Gainer, MD   10 months ago Axillary mass, right   [facility] [place]   11 months ago Essential hypertension   [facility] Crissman, Redge Gainer, MD      Future Appointments            In 3 months Crissman, Redge Gainer, MD [facility], [facility]   In 6 months  [facility], [facility]

## 2018-09-15 NOTE — Telephone Encounter (Signed)
Copied from CRM [id number]. Topic: Quick Communication - Rx Refill/Question >> Sep 15, 2018  3:03 PM Crist Infante wrote: Medication: nystatin (MYCOSTATIN/NYSTOP) powder Pt just saw Dr Dossie Arbour today and forgot to ask for this refill [id number] [place], [place] - [address] [phone] (Phone) [phone] (Fax)

## 2018-09-20 ENCOUNTER — Ambulatory Visit: Payer: Medicare HMO | Attending: Pain Medicine | Admitting: Pain Medicine

## 2018-09-20 ENCOUNTER — Other Ambulatory Visit: Payer: Self-pay

## 2018-09-20 ENCOUNTER — Encounter: Payer: Self-pay | Admitting: Pain Medicine

## 2018-09-20 VITALS — BP 118/63 | HR 74 | Temp 98.1°F | Resp 16 | Ht 59.0 in | Wt 215.0 lb

## 2018-09-20 DIAGNOSIS — R7982 Elevated C-reactive protein (CRP): Secondary | ICD-10-CM | POA: Insufficient documentation

## 2018-09-20 DIAGNOSIS — M1712 Unilateral primary osteoarthritis, left knee: Secondary | ICD-10-CM

## 2018-09-20 DIAGNOSIS — R700 Elevated erythrocyte sedimentation rate: Secondary | ICD-10-CM | POA: Diagnosis not present

## 2018-09-20 DIAGNOSIS — M150 Primary generalized (osteo)arthritis: Secondary | ICD-10-CM | POA: Diagnosis present

## 2018-09-20 DIAGNOSIS — Z96651 Presence of right artificial knee joint: Secondary | ICD-10-CM | POA: Insufficient documentation

## 2018-09-20 DIAGNOSIS — M25562 Pain in left knee: Secondary | ICD-10-CM | POA: Diagnosis present

## 2018-09-20 DIAGNOSIS — M25561 Pain in right knee: Secondary | ICD-10-CM | POA: Diagnosis present

## 2018-09-20 DIAGNOSIS — G894 Chronic pain syndrome: Secondary | ICD-10-CM | POA: Diagnosis present

## 2018-09-20 DIAGNOSIS — M47816 Spondylosis without myelopathy or radiculopathy, lumbar region: Secondary | ICD-10-CM

## 2018-09-20 DIAGNOSIS — G8929 Other chronic pain: Secondary | ICD-10-CM | POA: Insufficient documentation

## 2018-09-20 DIAGNOSIS — M159 Polyosteoarthritis, unspecified: Secondary | ICD-10-CM

## 2018-09-20 DIAGNOSIS — Z6841 Body Mass Index (BMI) 40.0 and over, adult: Secondary | ICD-10-CM | POA: Insufficient documentation

## 2018-09-20 MED ORDER — LIDOCAINE HCL (PF) 1 % IJ SOLN
4.0000 mL | Freq: Once | INTRAMUSCULAR | Status: AC
  Administered 2018-09-20: 5 mL
  Filled 2018-09-20: qty 5

## 2018-09-20 MED ORDER — SODIUM HYALURONATE (VISCOSUP) 20 MG/2ML IX SOSY
2.0000 mL | PREFILLED_SYRINGE | Freq: Once | INTRA_ARTICULAR | Status: AC
  Administered 2018-09-20: 2 mL via INTRA_ARTICULAR

## 2018-09-20 MED ORDER — ROPIVACAINE HCL 2 MG/ML IJ SOLN
4.0000 mL | Freq: Once | INTRAMUSCULAR | Status: AC
  Administered 2018-09-20: 10 mL via INTRA_ARTICULAR
  Filled 2018-09-20: qty 10

## 2018-09-20 NOTE — Progress Notes (Signed)
Safety precautions to be maintained throughout the outpatient stay will include: orient to surroundings, keep bed in low position, maintain call bell within reach at all times, provide assistance with transfer out of bed and ambulation.  

## 2018-09-20 NOTE — Progress Notes (Signed)
Patient's Name: Cynthia Jones  MRN: [id number]  Referring Provider: Delano MetzNaveira, Cynthia Holben, MD  DOB: 11/14/1940  PCP: Cynthia Sizerrissman, Cynthia A, MD  DOS: 09/20/2018  Note by: Cynthia DoneFrancisco Jones Trinady Milewski, MD  Service setting: Ambulatory outpatient  Specialty: Interventional Pain Management  Patient type: Established  Location: [facility] (AMB) Pain Management Facility  Visit type: Interventional Procedure   Primary Reason for Visit: Interventional Pain Management Treatment. CC: Knee Pain (left)  Procedure:          Anesthesia, Analgesia, Anxiolysis:  Type: Therapeutic Intra-Articular Hyalgan Knee Injection #2  Region: Lateral infrapatellar Knee Region Level: Knee Joint Laterality: Left knee  Type: Local Anesthesia Indication(s): Analgesia         Local Anesthetic: Lidocaine 1-2% Route: Infiltration ([place]/IM) IV Access: Declined Sedation: Declined   Position: Sitting   Indications: 1. Osteoarthritis of knee (Left)   2. Chronic knee pain (Primary Area of Pain) (Bilateral) (L>R)   3. Chronic pain syndrome   4. Elevated C-reactive protein (CRP)   5. Elevated sed rate    Pain Score: Pre-procedure: 8 /10 Post-procedure: 8 /10  Pre-op Assessment:  Cynthia Jones is Jones 78 y.o. (year old), female patient, seen today for interventional treatment. She  has Jones past surgical history that includes Parotidectomy (Left); Appendectomy; Cholecystectomy; Abdominal hysterectomy; Tonsillectomy; Joint replacement (2014); Eye surgery; and Breast cyst aspiration (Right, 04/18/2013). Cynthia Jones has Jones current medication list which includes the following prescription(s): alprazolam, atorvastatin, calcium carbonate, carvedilol, vitamin d3, duloxetine, ergocalciferol, esomeprazole, glucose blood, levothyroxine, losartan, magnesium, NONFORMULARY OR COMPOUNDED ITEM, nystatin, oxycodone, oxycodone, and oxycodone. Her primarily concern today is the Knee Pain (left)  Initial Vital Signs:  Pulse/HCG Rate: 74  Temp: 98.1 F (36.7 C) Resp:  16 BP: 118/63 SpO2: 99 %  BMI: Estimated body mass index is 43.42 kg/m as calculated from the following:   Height as of this encounter: 4\' 11"  (1.499 m).   Weight as of this encounter: 215 lb (97.5 kg).  Risk Assessment: Allergies: Reviewed. She is allergic to nsaids and erythromycin.  Allergy Precautions: None required Coagulopathies: Reviewed. None identified.  Blood-thinner therapy: None at this time Active Infection(s): Reviewed. None identified. Cynthia Jones is afebrile  Site Confirmation: Cynthia Jones was asked to confirm the procedure and laterality before marking the site Procedure checklist: Completed Consent: Before the procedure and under the influence of no sedative(s), amnesic(s), or anxiolytics, the patient was informed of the treatment options, risks and possible complications. To fulfill our ethical and legal obligations, as recommended by the American Medical Association's Code of Ethics, I have informed the patient of my clinical impression; the nature and purpose of the treatment or procedure; the risks, benefits, and possible complications of the intervention; the alternatives, including doing nothing; the risk(s) and benefit(s) of the alternative treatment(s) or procedure(s); and the risk(s) and benefit(s) of doing nothing. The patient was provided information about the general risks and possible complications associated with the procedure. These may include, but are not limited to: failure to achieve desired goals, infection, bleeding, organ or nerve damage, allergic reactions, paralysis, and death. In addition, the patient was informed of those risks and complications associated to the procedure, such as failure to decrease pain; infection; bleeding; organ or nerve damage with subsequent damage to sensory, motor, and/or autonomic systems, resulting in permanent pain, numbness, and/or weakness of one or several areas of the body; allergic reactions; (i.e.: anaphylactic  reaction); and/or death. Furthermore, the patient was informed of those risks and complications associated with the medications. These  include, but are not limited to: allergic reactions (i.e.: anaphylactic or anaphylactoid reaction(s)); adrenal axis suppression; blood sugar elevation that in diabetics may result in ketoacidosis or comma; water retention that in patients with history of congestive heart failure may result in shortness of breath, pulmonary edema, and decompensation with resultant heart failure; weight gain; swelling or edema; medication-induced neural toxicity; particulate matter embolism and blood vessel occlusion with resultant organ, and/or nervous system infarction; and/or aseptic necrosis of one or more joints. Finally, the patient was informed that Medicine is not an exact science; therefore, there is also the possibility of unforeseen or unpredictable risks and/or possible complications that may result in Jones catastrophic outcome. The patient indicated having understood very clearly. We have given the patient no guarantees and we have made no promises. Enough time was given to the patient to ask questions, all of which were answered to the patient's satisfaction. Cynthia Jones has indicated that she wanted to continue with the procedure. Attestation: I, the ordering provider, attest that I have discussed with the patient the benefits, risks, side-effects, alternatives, likelihood of achieving goals, and potential problems during recovery for the procedure that I have provided informed consent. Date  Time: 09/20/2018 12:34 PM  Pre-Procedure Preparation:  Monitoring: As per clinic protocol. Respiration, ETCO2, SpO2, BP, heart rate and rhythm monitor placed and checked for adequate function Safety Precautions: Patient was assessed for positional comfort and pressure points before starting the procedure. Time-out: I initiated and conducted the "Time-out" before starting the procedure, as  per protocol. The patient was asked to participate by confirming the accuracy of the "Time Out" information. Verification of the correct person, site, and procedure were performed and confirmed by me, the nursing staff, and the patient. "Time-out" conducted as per Joint Commission's Universal Protocol (UP.01.01.01). Time: 1256  Description of Procedure:          Target Area: Knee Joint Approach: Just above the Lateral tibial plateau, lateral to the infrapatellar tendon. Area Prepped: Entire knee area, from the mid-thigh to the mid-shin. Prepping solution: ChloraPrep (2% chlorhexidine gluconate and 70% isopropyl alcohol) Safety Precautions: Aspiration looking for blood return was conducted prior to all injections. At no point did we inject any substances, as Jones needle was being advanced. No attempts were made at seeking any paresthesias. Safe injection practices and needle disposal techniques used. Medications properly checked for expiration dates. SDV (single dose vial) medications used. Description of the Procedure: Protocol guidelines were followed. The patient was placed in position over the fluoroscopy table. The target area was identified and the area prepped in the usual manner. Skin & deeper tissues infiltrated with local anesthetic. Appropriate amount of time allowed to pass for local anesthetics to take effect. The procedure needles were then advanced to the target area. Proper needle placement secured. Negative aspiration confirmed. Solution injected in intermittent fashion, asking for systemic symptoms every 0.5cc of injectate. The needles were then removed and the area cleansed, making sure to leave some of the prepping solution back to take advantage of its long term bactericidal properties. Vitals:   09/20/18 1230  BP: 118/63  Pulse: 74  Resp: 16  Temp: 98.1 F (36.7 C)  TempSrc: Oral  SpO2: 99%  Weight: 215 lb (97.5 kg)  Height: 4\' 11"  (1.499 m)    Start Time: 1256 hrs. End Time:  1257 hrs. Materials:  Needle(s) Type: Regular needle Gauge: 25G Length: 1.5-in Medication(s): Please see orders for medications and dosing details.  Imaging Guidance:  Type of Imaging Technique: None used Indication(s): N/Jones Exposure Time: No patient exposure Contrast: None used. Fluoroscopic Guidance: N/Jones Ultrasound Guidance: N/Jones Interpretation: N/Jones  Antibiotic Prophylaxis:   Anti-infectives (From admission, onward)   None     Indication(s): None identified  Post-operative Assessment:  Post-procedure Vital Signs:  Pulse/HCG Rate: 74  Temp: 98.1 F (36.7 C) Resp: 16 BP: 118/63 SpO2: 99 %  EBL: None  Complications: No immediate post-treatment complications observed by team, or reported by patient.  Note: The patient tolerated the entire procedure well. Jones repeat set of vitals were taken after the procedure and the patient was kept under observation following institutional policy, for this type of procedure. Post-procedural neurological assessment was performed, showing return to baseline, prior to discharge. The patient was provided with post-procedure discharge instructions, including Jones section on how to identify potential problems. Should any problems arise concerning this procedure, the patient was given instructions to immediately contact us, at any time, without hesitation. In any case, we plan to contact the patient by telephone for Jones follow-up status report regarding this interventional procedure.  Comments:  No additional relevant information.  Plan of Care  Interventional management options: Planned, scheduled, and/or pending:   NOTE: NO Lumbar RFA until BMI <35. Diagnostic left intra-articular Hyalgan knee joint injection #3   No further increases in opioid analgesics.  Analgesic cream ordered by Thad Ranger, NP, has been effective. We'll reorder when she runs out. Medication managements with Georgie Chard, NP, under my supervision.   Considering:    Diagnostic right Genicular nerve block #2(90/90/50) Possible right Genicular nerve RFA  Diagnosticleft intra-articular knee joint injection #2(100/100/90) Possible series of 5 left intra-articular Hyalgan knee injections Diagnostic right Genicular nerve block Possibleright Genicular nerve RFA Diagnostic bilateral lumbar facet block #2 Possible bilateral lumbar facet RFA(NO RFA until BMI < 35) Diagnostic left lumbar epidural steroid injection Diagnostic caudal epidural steroid injection + epidurogram Possible Racz procedure Diagnostic bilateral cervical facet block Possible bilateral cervical facet RFA Diagnostic right versus left cervical epidural steroid injection Diagnostic bilateral intra-articular shoulder joint injection Diagnostic bilateral suprascapular nerve block Possible bilateral suprascapular nerve RFA   Palliative PRN treatment(s):   Palliative/therapeutic left Hyalgan knee injections  Palliative bilateral lumbar facet blocks  Diagnostic right-sided genicular nerve block    Imaging Orders  No imaging studies ordered today    Procedure Orders     KNEE INJECTION     KNEE INJECTION     KNEE INJECTION     GENICULAR NERVE BLOCK     LUMBAR FACET(MEDIAL BRANCH NERVE BLOCK) MBNB  Medications ordered for procedure: Meds ordered this encounter  Medications  . lidocaine (PF) (XYLOCAINE) 1 % injection 4 mL  . ropivacaine (PF) 2 mg/mL (0.2%) (NAROPIN) injection 4 mL  . Sodium Hyaluronate SOSY 2 mL   Medications administered: We administered lidocaine (PF), ropivacaine (PF) 2 mg/mL (0.2%), and Sodium Hyaluronate.  See the medical record for exact dosing, route, and time of administration.  Disposition: Discharge home  Discharge Date & Time: 09/20/2018; 1300 hrs.   Physician-requested Follow-up: Return in about 2 weeks (around 10/04/2018) for Procedure (no sedation): (L) Hyalgan #3.  Future Appointments  Date Time Provider Department Center  10/04/2018  12:30 PM Cynthia Metz, MD [facility] None  11/24/2018  1:30 PM Barbette Merino, NP [facility] None  12/19/2018  1:30 PM Cynthia Sizer, MD [facility]  04/12/2019  9:30 AM [facility] NURSE HEALTH ADVISOR [facility]   Primary Care Physician: Cynthia Sizer, MD Location:  [facility] Facility Note by: Cynthia Done, MD Date: 09/20/2018; Time: 1:04 PM  Disclaimer:  Medicine is not an Visual merchandiser. The only guarantee in medicine is that nothing is guaranteed. It is important to note that the decision to proceed with this intervention was based on the information collected from the patient. The Data and conclusions were drawn from the patient's questionnaire, the interview, and the physical examination. Because the information was provided in large part by the patient, it cannot be guaranteed that it has not been purposely or unconsciously manipulated. Every effort has been made to obtain as much relevant data as possible for this evaluation. It is important to note that the conclusions that lead to this procedure are derived in large part from the available data. Always take into account that the treatment will also be dependent on availability of resources and existing treatment guidelines, considered by other Pain Management Practitioners as being common knowledge and practice, at the time of the intervention. For Medico-Legal purposes, it is also important to point out that variation in procedural techniques and pharmacological choices are the acceptable norm. The indications, contraindications, technique, and results of the above procedure should only be interpreted and judged by Jones Board-Certified Interventional Pain Specialist with extensive familiarity and expertise in the same exact procedure and technique.

## 2018-09-20 NOTE — Patient Instructions (Addendum)
____________________________________________________________________________________________  Post-Procedure Discharge Instructions  Instructions:  Apply ice: Fill a plastic sandwich bag with crushed ice. Cover it with a small towel and apply to injection site. Apply for 15 minutes then remove x 15 minutes. Repeat sequence on day of procedure, until you go to bed. The purpose is to minimize swelling and discomfort after procedure.  Apply heat: Apply heat to procedure site starting the day following the procedure. The purpose is to treat any soreness and discomfort from the procedure.  Food intake: Start with clear liquids (like water) and advance to regular food, as tolerated.   Physical activities: Keep activities to a minimum for the first 8 hours after the procedure.   Driving: If you have received any sedation, you are not allowed to drive for 24 hours after your procedure.  Blood thinner: Restart your blood thinner 6 hours after your procedure. (Only for those taking blood thinners)  Insulin: As soon as you can eat, you may resume your normal dosing schedule. (Only for those taking insulin)  Infection prevention: Keep procedure site clean and dry.  Post-procedure Pain Diary: Extremely important that this be done correctly and accurately. Recorded information will be used to determine the next step in treatment.  Pain evaluated is that of treated area only. Do not include pain from an untreated area.  Complete every hour, on the hour, for the initial 8 hours. Set an alarm to help you do this part accurately.  Do not go to sleep and have it completed later. It will not be accurate.  Follow-up appointment: Keep your follow-up appointment after the procedure. Usually 2 weeks for most procedures. (6 weeks in the case of radiofrequency.) Bring you pain diary.   Expect:  From numbing medicine (AKA: Local Anesthetics): Numbness or decrease in pain.  Onset: Full effect within 15  minutes of injected.  Duration: It will depend on the type of local anesthetic used. On the average, 1 to 8 hours.   From steroids: Decrease in swelling or inflammation. Once inflammation is improved, relief of the pain will follow.  Onset of benefits: Depends on the amount of swelling present. The more swelling, the longer it will take for the benefits to be seen. In some cases, up to 10 days.  Duration: Steroids will stay in the system x 2 weeks. Duration of benefits will depend on multiple posibilities including persistent irritating factors.  Occasional side-effects: Facial flushing (red, warm cheeks) , cramps (if present, drink Gatorade and take over-the-counter Magnesium 450-500 mg once to twice a day).  From procedure: Some discomfort is to be expected once the numbing medicine wears off. This should be minimal if ice and heat are applied as instructed.  Call if:  You experience numbness and weakness that gets worse with time, as opposed to wearing off.  New onset bowel or bladder incontinence. (This applies to Spinal procedures only)  Emergency Numbers:  Durning business hours (Monday - Thursday, 8:00 AM - 4:00 PM) (Friday, 9:00 AM - 12:00 Noon): [phone]  After hours: [phone] ____________________________________________________________________________________________   ____________________________________________________________________________________________  Preparing for your procedure (without sedation)  Instructions: . Oral Intake: Do not eat or drink anything for at least 3 hours prior to your procedure. . Transportation: Unless otherwise stated by your physician, you may drive yourself after the procedure. . Blood Pressure Medicine: Take your blood pressure medicine with a sip of water the morning of the procedure. . Blood thinners: Notify our staff if you are taking any blood   thinners. Depending on which one you take, there will be specific  instructions on how and when to stop it. . Diabetics on insulin: Notify the staff so that you can be scheduled 1st case in the morning. If your diabetes requires high dose insulin, take only  of your normal insulin dose the morning of the procedure and notify the staff that you have done so. . Preventing infections: Shower with an antibacterial soap the morning of your procedure.  . Build-up your immune system: Take 1000 mg of Vitamin C with every meal (3 times a day) the day prior to your procedure. [name] Antibiotics: Inform the staff if you have a condition or reason that requires you to take antibiotics before dental procedures. . Pregnancy: If you are pregnant, call and cancel the procedure. . Sickness: If you have a cold, fever, or any active infections, call and cancel the procedure. . Arrival: You must be in the facility at least 30 minutes prior to your scheduled procedure. . Children: Do not bring any children with you. . Dress appropriately: Bring dark clothing that you would not mind if they get stained. . Valuables: Do not bring any jewelry or valuables.  Procedure appointments are reserved for interventional treatments only. [name] No Prescription Refills. . No medication changes will be discussed during procedure appointments. . No disability issues will be discussed.  Reasons to call and reschedule or cancel your procedure: (Following these recommendations will minimize the risk of a serious complication.) . Surgeries: Avoid having procedures within 2 weeks of any surgery. (Avoid for 2 weeks before or after any surgery). . Flu Shots: Avoid having procedures within 2 weeks of a flu shots or . (Avoid for 2 weeks before or after immunizations). . Barium: Avoid having a procedure within 7-10 days after having had a radiological study involving the use of radiological contrast. (Myelograms, Barium swallow or enema study). . Heart attacks: Avoid any elective procedures or surgeries for the  initial 6 months after a "Myocardial Infarction" (Heart Attack). . Blood thinners: It is imperative that you stop these medications before procedures. Let us know if you if you take any blood thinner.  . Infection: Avoid procedures during or within two weeks of an infection (including chest colds or gastrointestinal problems). Symptoms associated with infections include: Localized redness, fever, chills, night sweats or profuse sweating, burning sensation when voiding, cough, congestion, stuffiness, runny nose, sore throat, diarrhea, nausea, vomiting, cold or Flu symptoms, recent or current infections. It is specially important if the infection is over the area that we intend to treat. [name] Heart and lung problems: Symptoms that may suggest an active cardiopulmonary problem include: cough, chest pain, breathing difficulties or shortness of breath, dizziness, ankle swelling, uncontrolled high or unusually low blood pressure, and/or palpitations. If you are experiencing any of these symptoms, cancel your procedure and contact your primary care physician for an evaluation.  Remember:  Regular Business hours are:  Monday to Thursday 8:00 AM to 4:00 PM  Provider's Schedule: Delano Metz, MD:  Procedure days: Tuesday and Thursday 7:30 AM to 4:00 PM  Edward Jolly, MD:  Procedure days: Monday and Wednesday 7:30 AM to 4:00 PM ____________________________________________________________________________________________   ____________________________________________________________________________________________  Weight Management Required  URGENT: Your weight has been found to be adversely affecting your health.  Dear Ms. Townsend Roger:  Your current There is no height or weight on file to calculate BMI.. Estimated body mass index is 43.63 kg/m as calculated from the following:   Height as of  09/15/18: 4' 10.9" (1.496 m).   Weight as of 09/15/18: 215 lb 4 oz (97.6 kg).  Your last four (4) weight and  BMI calculations are as follows: Wt Readings from Last 4 Encounters:  09/15/18 215 lb 4 oz (97.6 kg)  09/05/18 214 lb (97.1 kg)  08/08/18 214 lb 11.2 oz (97.4 kg)  06/14/18 218 lb 6.4 oz (99.1 kg)   BMI Readings from Last 4 Encounters:  09/15/18 43.63 kg/m  09/05/18 39.14 kg/m  08/08/18 41.93 kg/m  06/14/18 42.65 kg/m    Calculations estimate your ideal body weight to be: Patient must be at least 60 in tall to calculate ideal body weight  Please use the table below to identify your weight category and associated incidence of chronic pain, secondary to your weight.  BMI interpretation table: BMI level Category Associated incidence of chronic pain  <18 kg/m2 Underweight   18.5-24.9 kg/m2 Ideal body weight   25-29.9 kg/m2 Overweight  20%  30-34.9 kg/m2 Obese (Class I)  68%  35-39.9 kg/m2 Severe obesity (Class II)  136%  >40 kg/m2 Extreme obesity (Class III)  254%   In addition: You will be considered "Morbidly Obese", if your BMI is above 30 and you have one or more of the following conditions that are directly associated with obesity: 1. Type 2 Diabetes (Which in turn can lead to cardiovascular diseases (CVD), stroke, peripheral vascular diseases (PVD), retinopathy, nephropathy, and neuropathy) 2. Cardiovascular Disease  3. Breathing problems (Asthma, obesity-hypoventilation syndrome, obstructive sleep apnea, chronic inflammatory airway disease) 4. Chronic kidney disease 5. Liver disease (nonalcoholic fatty liver disease) 6. High blood pressure 7. Acid reflux (gastroesophageal reflux disease) 8. Osteoarthritis (OA) 9. Low back pain (Lumbar Facet Syndrome) 10. Hip pain (Osteoarthritis of hip) 11. Knee pain (Osteoarthritis of knee) (patients with a BMI>30 kg/m2 were 6.8 times more likely to develop knee OA than normal-weight individuals) 12. Certain types of cancer. (Epidemiological studies have shown that obesity is a risk factor for: post-menopausal breast cancer; cancers of  the endometrium, colon and kidney; malignant adenomas of the oesophagus. Obese subjects have an approximately 1.5-3.5-fold increased risk of developing these cancers compared with normal-weight subjects, and it has been estimated that between 15 and 45% of these cancers can be attributed to overweight. More recent studies suggest that obesity may also increase the risk of other types of cancer, including pancreatic, hepatic and gallbladder cancer. Ref: Obesity and cancer. Pischon T, Nthlings U, [employer] H. Proc Nutr Soc. 2008 May;67(2):128-45. doi: 10.1017/S0029665108006976.)  Recommendation: At this point it is urgent that you take a step back and concentrate in loosing weight. Because most chronic pain patients do have difficulty exercising secondary to their pain, you must rely on proper nutrition and dieting in order to lose the weight. If your BMI is above 40, you should seriously consider bariatric surgery. A realistic goal is to lose 10% of your body weight over a period of 12 months.  If over time you have unsuccessfully try to lose weight, then it is time for you to seek professional help and to enter a medically supervised weight management program.  Pain management considerations:  1. Pharmacological Problems: Be advised that the use of opioid analgesics has been associated with decreased metabolism and weight gain.  For this reason, should we see that you are unable to lose weight while taking these medications, it may become necessary for us to taper down and indefinitely discontinue these medicines.  2. Technical Problems: The incidence of successful interventional therapies decreases  as the patient's BMI increases. It is much more difficult to accomplish a safe and effective interventional therapy on a patient with a BMI above 35. Yours is There is no height or weight on file to calculate BMI.. 3. Radiation Exposure Problems: The x-rays machine, used to accomplish injection therapies, will  automatically increase their x-ray output in order to capture an appropriate bone image. This means that radiation exposure increases exponentially with the patient's BMI. (The higher the BMI, the higher the radiation exposure.) Although the level of radiation used at a given time is still safe to the patient, it is not for the physician and/or assisting staff. Unfortunately, radiation exposure is accumulative. Because physicians and the staff have to do procedures and be exposed on a daily basis, this can result in health problems such as cancer and radiation burns. Radiation exposure to the staff is monitored by the radiation batches that they wear. The exposure levels are reported back to the staff on a quarterly basis. Depending on levels of exposure, physicians and staff may be obligated by law to decrease this exposure. This means that they have the right and obligation to refuse providing therapies where they may be overexposed to radiation. For this reason, physicians may decline to offer therapies such as radiofrequency ablation or implants to patients with a BMI above 40. ____________________________________________________________________________________________

## 2018-09-21 ENCOUNTER — Telehealth: Payer: Self-pay

## 2018-09-21 LAB — RHEUMATOID FACTOR: Rheumatoid fact SerPl-aCnc: 10.4 IU/mL (ref 0.0–13.9)

## 2018-09-21 LAB — ANA W/REFLEX IF POSITIVE: Anti Nuclear Antibody(ANA): NEGATIVE

## 2018-09-21 NOTE — Telephone Encounter (Signed)
Post procedure phone call.  Unable to leave message.  

## 2018-10-04 ENCOUNTER — Encounter: Payer: Self-pay | Admitting: Pain Medicine

## 2018-10-04 ENCOUNTER — Ambulatory Visit: Payer: Medicare HMO | Attending: Pain Medicine | Admitting: Pain Medicine

## 2018-10-04 ENCOUNTER — Other Ambulatory Visit: Payer: Self-pay

## 2018-10-04 VITALS — BP 146/69 | HR 90 | Temp 97.7°F | Resp 18 | Ht 60.0 in | Wt 215.0 lb

## 2018-10-04 DIAGNOSIS — M1712 Unilateral primary osteoarthritis, left knee: Secondary | ICD-10-CM | POA: Insufficient documentation

## 2018-10-04 DIAGNOSIS — G8929 Other chronic pain: Secondary | ICD-10-CM | POA: Diagnosis present

## 2018-10-04 DIAGNOSIS — M25562 Pain in left knee: Secondary | ICD-10-CM | POA: Diagnosis present

## 2018-10-04 DIAGNOSIS — M25561 Pain in right knee: Secondary | ICD-10-CM | POA: Insufficient documentation

## 2018-10-04 MED ORDER — LIDOCAINE HCL (PF) 1 % IJ SOLN
INTRAMUSCULAR | Status: AC
  Filled 2018-10-04: qty 5

## 2018-10-04 MED ORDER — SODIUM HYALURONATE (VISCOSUP) 20 MG/2ML IX SOSY
2.0000 mL | PREFILLED_SYRINGE | Freq: Once | INTRA_ARTICULAR | Status: AC
  Administered 2018-10-04: 13:00:00 via INTRA_ARTICULAR

## 2018-10-04 MED ORDER — LIDOCAINE HCL (PF) 1 % IJ SOLN
5.0000 mL | Freq: Once | INTRAMUSCULAR | Status: AC
  Administered 2018-10-04: 5 mL

## 2018-10-04 MED ORDER — ROPIVACAINE HCL 2 MG/ML IJ SOLN
5.0000 mL | Freq: Once | INTRAMUSCULAR | Status: AC
  Administered 2018-10-04: 10 mL via INTRA_ARTICULAR

## 2018-10-04 MED ORDER — ROPIVACAINE HCL 2 MG/ML IJ SOLN
INTRAMUSCULAR | Status: AC
  Filled 2018-10-04: qty 10

## 2018-10-04 NOTE — Patient Instructions (Addendum)
____________________________________________________________________________________________  Post-Procedure Discharge Instructions  Instructions:  Apply ice: Fill a plastic sandwich bag with crushed ice. Cover it with a small towel and apply to injection site. Apply for 15 minutes then remove x 15 minutes. Repeat sequence on day of procedure, until you go to bed. The purpose is to minimize swelling and discomfort after procedure.  Apply heat: Apply heat to procedure site starting the day following the procedure. The purpose is to treat any soreness and discomfort from the procedure.  Food intake: Start with clear liquids (like water) and advance to regular food, as tolerated.   Physical activities: Keep activities to a minimum for the first 8 hours after the procedure.   Driving: If you have received any sedation, you are not allowed to drive for 24 hours after your procedure.  Blood thinner: Restart your blood thinner 6 hours after your procedure. (Only for those taking blood thinners)  Insulin: As soon as you can eat, you may resume your normal dosing schedule. (Only for those taking insulin)  Infection prevention: Keep procedure site clean and dry.  Post-procedure Pain Diary: Extremely important that this be done correctly and accurately. Recorded information will be used to determine the next step in treatment.  Pain evaluated is that of treated area only. Do not include pain from an untreated area.  Complete every hour, on the hour, for the initial 8 hours. Set an alarm to help you do this part accurately.  Do not go to sleep and have it completed later. It will not be accurate.  Follow-up appointment: Keep your follow-up appointment after the procedure. Usually 2 weeks for most procedures. (6 weeks in the case of radiofrequency.) Bring you pain diary.   Expect:  From numbing medicine (AKA: Local Anesthetics): Numbness or decrease in pain.  Onset: Full effect within 15  minutes of injected.  Duration: It will depend on the type of local anesthetic used. On the average, 1 to 8 hours.   From steroids: Decrease in swelling or inflammation. Once inflammation is improved, relief of the pain will follow.  Onset of benefits: Depends on the amount of swelling present. The more swelling, the longer it will take for the benefits to be seen. In some cases, up to 10 days.  Duration: Steroids will stay in the system x 2 weeks. Duration of benefits will depend on multiple posibilities including persistent irritating factors.  Occasional side-effects: Facial flushing (red, warm cheeks) , cramps (if present, drink Gatorade and take over-the-counter Magnesium 450-500 mg once to twice a day).  From procedure: Some discomfort is to be expected once the numbing medicine wears off. This should be minimal if ice and heat are applied as instructed.  Call if:  You experience numbness and weakness that gets worse with time, as opposed to wearing off.  New onset bowel or bladder incontinence. (This applies to Spinal procedures only)  Emergency Numbers:  Durning business hours (Monday - Thursday, 8:00 AM - 4:00 PM) (Friday, 9:00 AM - 12:00 Noon): [phone]  After hours: [phone] ____________________________________________________________________________________________   ____________________________________________________________________________________________  Preparing for your procedure (without sedation)  Instructions: . Oral Intake: Do not eat or drink anything for at least 3 hours prior to your procedure. . Transportation: Unless otherwise stated by your physician, you may drive yourself after the procedure. . Blood Pressure Medicine: Take your blood pressure medicine with a sip of water the morning of the procedure. . Blood thinners: Notify our staff if you are taking any blood   thinners. Depending on which one you take, there will be specific  instructions on how and when to stop it. . Diabetics on insulin: Notify the staff so that you can be scheduled 1st case in the morning. If your diabetes requires high dose insulin, take only  of your normal insulin dose the morning of the procedure and notify the staff that you have done so. . Preventing infections: Shower with an antibacterial soap the morning of your procedure.  . Build-up your immune system: Take 1000 mg of Vitamin C with every meal (3 times a day) the day prior to your procedure. . Antibiotics: Inform the staff if you have a condition or reason that requires you to take antibiotics before dental procedures. . Pregnancy: If you are pregnant, call and cancel the procedure. . Sickness: If you have a cold, fever, or any active infections, call and cancel the procedure. . Arrival: You must be in the facility at least 30 minutes prior to your scheduled procedure. . Children: Do not bring any children with you. . Dress appropriately: Bring dark clothing that you would not mind if they get stained. . Valuables: Do not bring any jewelry or valuables.  Procedure appointments are reserved for interventional treatments only. . No Prescription Refills. . No medication changes will be discussed during procedure appointments. . No disability issues will be discussed.  Reasons to call and reschedule or cancel your procedure: (Following these recommendations will minimize the risk of a serious complication.) . Surgeries: Avoid having procedures within 2 weeks of any surgery. (Avoid for 2 weeks before or after any surgery). . Flu Shots: Avoid having procedures within 2 weeks of a flu shots or . (Avoid for 2 weeks before or after immunizations). . Barium: Avoid having a procedure within 7-10 days after having had a radiological study involving the use of radiological contrast. (Myelograms, Barium swallow or enema study). . Heart attacks: Avoid any elective procedures or surgeries for the  initial 6 months after a "Myocardial Infarction" (Heart Attack). . Blood thinners: It is imperative that you stop these medications before procedures. Let us know if you if you take any blood thinner.  . Infection: Avoid procedures during or within two weeks of an infection (including chest colds or gastrointestinal problems). Symptoms associated with infections include: Localized redness, fever, chills, night sweats or profuse sweating, burning sensation when voiding, cough, congestion, stuffiness, runny nose, sore throat, diarrhea, nausea, vomiting, cold or Flu symptoms, recent or current infections. It is specially important if the infection is over the area that we intend to treat. . Heart and lung problems: Symptoms that may suggest an active cardiopulmonary problem include: cough, chest pain, breathing difficulties or shortness of breath, dizziness, ankle swelling, uncontrolled high or unusually low blood pressure, and/or palpitations. If you are experiencing any of these symptoms, cancel your procedure and contact your primary care physician for an evaluation.  Remember:  Regular Business hours are:  Monday to Thursday 8:00 AM to 4:00 PM  Provider's Schedule: Namari Breton, MD:  Procedure days: Tuesday and Thursday 7:30 AM to 4:00 PM  Bilal Lateef, MD:  Procedure days: Monday and Wednesday 7:30 AM to 4:00 PM ____________________________________________________________________________________________   ____________________________________________________________________________________________  Post-procedure Information What to expect: Most procedures involve the use of a local anesthetic (numbing medicine), and a steroid (anti-inflammatory medicine).  The local anesthetics may cause temporary numbness and weakness of the legs or arms, depending on the location of the block. This numbness/weakness may last 4-6 hours, depending on   the local anesthetic used. In rare instances, it can  last up to 24 hours. While numb, you must be very careful not to injure the extremity.  After any procedure, you could expect the pain to get better within 15-20 minutes. This relief is temporary and may last 4-6 hours. Once the local anesthetics wears off, you could experience discomfort, possibly more than usual, for up to 10 (ten) days. In the case of radiofrequencies, it may last up to 6 weeks. Surgeries may take up to 8 weeks for the healing process. The discomfort is due to the irritation caused by needles going through skin and muscle. To minimize the discomfort, we recommend using ice the first day, and heat from then on. The ice should be applied for 15 minutes on, and 15 minutes off. Keep repeating this cycle until bedtime. Avoid applying the ice directly to the skin, to prevent frostbite. Heat should be used daily, until the pain improves (4-10 days). Be careful not to burn yourself.  Occasionally you may experience muscle spasms or cramps. These occur as a consequence of the irritation caused by the needle sticks to the muscle and the blood that will inevitably be lost into the surrounding muscle tissue. Blood tends to be very irritating to tissues, which tend to react by going into spasm. These spasms may start the same day of your procedure, but they may also take days to develop. This late onset type of spasm or cramp is usually caused by electrolyte imbalances triggered by the steroids, at the level of the kidney. Cramps and spasms tend to respond well to muscle relaxants, multivitamins (some are triggered by the procedure, but may have their origins in vitamin deficiencies), and "Gatorade", or any sports drinks that can replenish any electrolyte imbalances. (If you are a diabetic, ask your pharmacist to get you a sugar-free brand.) Warm showers or baths may also be helpful. Stretching exercises are highly recommended.  General Instructions:  Be alert for signs of possible infection: redness,  swelling, heat, red streaks, elevated temperature, and/or fever. These typically appear 4 to 6 days after the procedure. Immediately notify your doctor if you experience unusual bleeding, difficulty breathing, or loss of bowel or bladder control. If you experience increased pain, do not increase your pain medicine intake, unless instructed by your pain physician.  Post-Procedure Care:  Be careful in moving about. Muscle spasms in the area of the injection may occur. Applying ice or heat to the area is often helpful. The incidence of spinal headaches after epidural injections ranges between 1.4% and 6%. If you develop a headache that does not seem to respond to conservative therapy, please let your physician know. This can be treated with an epidural blood patch.   Post-procedure numbness or redness is to be expected, however it should average 4 to 6 hours. If numbness and weakness of your extremities begins to develop 4 to 6 hours after your procedure, and is felt to be progressing and worsening, immediately contact your physician.  Diet:  If you experience nausea, do not eat until this sensation goes away. If you had a "Stellate Ganglion Block" for upper extremity "Reflex Sympathetic Dystrophy", do not eat or drink until your hoarseness goes away. In any case, always start with liquids first and if you tolerate them well, then slowly progress to more solid foods.  Activity:  For the first 4 to 6 hours after the procedure, use caution in moving about as you may experience numbness and/or weakness.   Use caution in cooking, using household electrical appliances, and climbing steps. If you need to reach your Doctor call our office: [phone] (During business hours) or [phone] (After business hours).  Business Hours: Monday-Thursday 8:00 am - 4:00 PM    Fridays: Closed     In case of an emergency: In case of emergency, call 911 or go to the nearest emergency room and have the physician  there call us.  Interpretation of Procedure Every nerve block has two components: a diagnostic component, and a treatment component. Unrealistic expectations are the most common causes of "perceived failure".  In a perfect world, a single nerve block should be able to completely and permanently eliminate the pain. Sadly, the world is not perfect.  Most pain management nerve blocks are performed using local anesthetics and steroids. Steroids are responsible for any long-term benefit that you may experience. Their purpose is to decrease any chronic swelling that may exist in the area. Steroids begin to work immediately after being injected. However, most patients will not experience any benefits until 5 to 10 days after the injection, when the swelling has come down to the point where they can tell a difference. Steroids will only help if there is swelling to be treated. As such, they can assist with the diagnosis. If effective, they suggest an inflammatory component to the pain, and if ineffective, they rule out inflammation as the main cause or component of the problem. If the problem is one of mechanical compression, you will get no benefit from those steroids.   In the case of local anesthetics, they have a crucial role in the diagnosis of your condition. Most will begin to work within15 to 20 minutes after injection. The duration will depend on the type used (short- vs. Long-acting). It is of outmost importance that patients keep tract of their pain, after the procedure. To assist with this matter, a "Post-procedure Pain Diary" is provided. Make sure to complete it and to bring it back to your follow-up appointment.  As long as the patient keeps accurate, detailed records of their symptoms after every procedure, and returns to have those interpreted, every procedure will provide us with invaluable information. Even a block that does not provide the patient with any relief, will always provide us with  information about the mechanism and the origin of the pain. The only time a nerve block can be considered a waste of time is when patients do not keep track of the results, or do not keep their post-procedure appointment.  Reporting the results back to your physician The Pain Score  Pain is a subjective complaint. It cannot be seen, touched, or measured. We depend entirely on the patient's report of the pain in order to assess your condition and treatment. To evaluate the pain, we use a pain scale, where "0" means "No Pain", and a "10" is "the worst possible pain that you can even imagine" (i.e. something like been eaten alive by a shark or being torn apart by a lion).   Use the Pain Scale provided. You will frequently be asked to rate your pain. Please be accurate, remember that medical decisions will be based on your responses. Please do not rate your pain above a 10. Doing so is actually interpreted as "symptom magnification" (exaggeration). To put this into perspective, when you tell us that your pain is at a 10 (ten), what you are saying is that there is nothing we can do to make this pain   any worse. (Carefully think about that.) ____________________________________________________________________________________________    

## 2018-10-04 NOTE — Progress Notes (Signed)
Patient's Name: Cynthia Jones  MRN: [id number]  Referring Provider: Steele Sizer, MD  DOB: 18-Aug-1941  PCP: Steele Sizer, MD  DOS: 10/04/2018  Note by: Oswaldo Done, MD  Service setting: Ambulatory outpatient  Specialty: Interventional Pain Management  Patient type: Established  Location: [facility] (AMB) Pain Management Facility  Visit type: Interventional Procedure   Primary Reason for Visit: Interventional Pain Management Treatment. CC: Knee Pain  Procedure:          Anesthesia, Analgesia, Anxiolysis:  Type: Therapeutic Intra-Articular Hyalgan Knee Injection #3  Region: Lateral infrapatellar Knee Region Level: Knee Joint Laterality: Left knee  Type: Local Anesthesia Indication(s): Analgesia         Local Anesthetic: Lidocaine 1-2% Route: Infiltration (Grant/IM) IV Access: Declined Sedation: Declined   Position: Sitting   Indications: 1. Osteoarthritis of knee (Left)   2. Chronic knee pain (Primary Area of Pain) (Bilateral) (L>R)    Pain Score: Pre-procedure: 4 /10 Post-procedure: 1 /10  She indicates that she still not getting a whole lot of relief from the Hyalgan knee injections.  It is true that today is her third 1 and typically I do not see that patients get a whole lot of benefit until they get to this third one.  However, she is morbidly obese and it could be that she has already lost a significant amount of cartilage.  If she does not respond to the series of 5, then we will need to order an MRI of her left knee.  Pre-op Assessment:  Cynthia Jones is a 78 y.o. (year old), female patient, seen today for interventional treatment. She  has a past surgical history that includes Parotidectomy (Left); Appendectomy; Cholecystectomy; Abdominal hysterectomy; Tonsillectomy; Joint replacement (2014); Eye surgery; and Breast cyst aspiration (Right, 04/18/2013). Cynthia Jones has a current medication list which includes the following prescription(s): alprazolam, atorvastatin,  calcium carbonate, carvedilol, vitamin d3, duloxetine, ergocalciferol, esomeprazole, glucose blood, levothyroxine, losartan, magnesium, NONFORMULARY OR COMPOUNDED ITEM, nystatin, oxycodone, oxycodone, and oxycodone. Her primarily concern today is the Knee Pain  Initial Vital Signs:  Pulse/HCG Rate: 85  Temp: 97.7 F (36.5 C) Resp: 18 BP: 133/72 SpO2: 94 %  BMI: Estimated body mass index is 41.99 kg/m as calculated from the following:   Height as of this encounter: 5' (1.524 m).   Weight as of this encounter: 215 lb (97.5 kg).  Risk Assessment: Allergies: Reviewed. She is allergic to nsaids and erythromycin.  Allergy Precautions: None required Coagulopathies: Reviewed. None identified.  Blood-thinner therapy: None at this time Active Infection(s): Reviewed. None identified. Cynthia Jones is afebrile  Site Confirmation: Cynthia Jones was asked to confirm the procedure and laterality before marking the site Procedure checklist: Completed Consent: Before the procedure and under the influence of no sedative(s), amnesic(s), or anxiolytics, the patient was informed of the treatment options, risks and possible complications. To fulfill our ethical and legal obligations, as recommended by the American Medical Association's Code of Ethics, I have informed the patient of my clinical impression; the nature and purpose of the treatment or procedure; the risks, benefits, and possible complications of the intervention; the alternatives, including doing nothing; the risk(s) and benefit(s) of the alternative treatment(s) or procedure(s); and the risk(s) and benefit(s) of doing nothing. The patient was provided information about the general risks and possible complications associated with the procedure. These may include, but are not limited to: failure to achieve desired goals, infection, bleeding, organ or nerve damage, allergic reactions, paralysis, and death. In addition, the  patient was informed of  those risks and complications associated to the procedure, such as failure to decrease pain; infection; bleeding; organ or nerve damage with subsequent damage to sensory, motor, and/or autonomic systems, resulting in permanent pain, numbness, and/or weakness of one or several areas of the body; allergic reactions; (i.e.: anaphylactic reaction); and/or death. Furthermore, the patient was informed of those risks and complications associated with the medications. These include, but are not limited to: allergic reactions (i.e.: anaphylactic or anaphylactoid reaction(s)); adrenal axis suppression; blood sugar elevation that in diabetics may result in ketoacidosis or comma; water retention that in patients with history of congestive heart failure may result in shortness of breath, pulmonary edema, and decompensation with resultant heart failure; weight gain; swelling or edema; medication-induced neural toxicity; particulate matter embolism and blood vessel occlusion with resultant organ, and/or nervous system infarction; and/or aseptic necrosis of one or more joints. Finally, the patient was informed that Medicine is not an exact science; therefore, there is also the possibility of unforeseen or unpredictable risks and/or possible complications that may result in a catastrophic outcome. The patient indicated having understood very clearly. We have given the patient no guarantees and we have made no promises. Enough time was given to the patient to ask questions, all of which were answered to the patient's satisfaction. Cynthia Jones has indicated that she wanted to continue with the procedure. Attestation: I, the ordering provider, attest that I have discussed with the patient the benefits, risks, side-effects, alternatives, likelihood of achieving goals, and potential problems during recovery for the procedure that I have provided informed consent. Date  Time: 10/04/2018 12:48 PM  Pre-Procedure Preparation:   Monitoring: As per clinic protocol. Respiration, ETCO2, SpO2, BP, heart rate and rhythm monitor placed and checked for adequate function Safety Precautions: Patient was assessed for positional comfort and pressure points before starting the procedure. Time-out: I initiated and conducted the "Time-out" before starting the procedure, as per protocol. The patient was asked to participate by confirming the accuracy of the "Time Out" information. Verification of the correct person, site, and procedure were performed and confirmed by me, the nursing staff, and the patient. "Time-out" conducted as per Joint Commission's Universal Protocol (UP.01.01.01). Time: 1308  Description of Procedure:          Target Area: Knee Joint Approach: Just above the Lateral tibial plateau, lateral to the infrapatellar tendon. Area Prepped: Entire knee area, from the mid-thigh to the mid-shin. Prepping solution: ChloraPrep (2% chlorhexidine gluconate and 70% isopropyl alcohol) Safety Precautions: Aspiration looking for blood return was conducted prior to all injections. At no point did we inject any substances, as a needle was being advanced. No attempts were made at seeking any paresthesias. Safe injection practices and needle disposal techniques used. Medications properly checked for expiration dates. SDV (single dose vial) medications used. Description of the Procedure: Protocol guidelines were followed. The patient was placed in position over the fluoroscopy table. The target area was identified and the area prepped in the usual manner. Skin & deeper tissues infiltrated with local anesthetic. Appropriate amount of time allowed to pass for local anesthetics to take effect. The procedure needles were then advanced to the target area. Proper needle placement secured. Negative aspiration confirmed. Solution injected in intermittent fashion, asking for systemic symptoms every 0.5cc of injectate. The needles were then removed and  the area cleansed, making sure to leave some of the prepping solution back to take advantage of its long term bactericidal properties. Vitals:   10/04/18  1246 10/04/18 1313  BP: 133/72 (!) 146/69  Pulse: 85 90  Resp:  18  Temp: 97.7 F (36.5 C)   SpO2: 94% 97%  Weight: 215 lb (97.5 kg)   Height: 5' (1.524 m)     Start Time: 1308 hrs. End Time: 1311 hrs. Materials:  Needle(s) Type: Regular needle Gauge: 25G Length: 1.5-in Medication(s): Please see orders for medications and dosing details.  Imaging Guidance:          Type of Imaging Technique: None used Indication(s): N/A Exposure Time: No patient exposure Contrast: None used. Fluoroscopic Guidance: N/A Ultrasound Guidance: N/A Interpretation: N/A  Antibiotic Prophylaxis:   Anti-infectives (From admission, onward)   None     Indication(s): None identified  Post-operative Assessment:  Post-procedure Vital Signs:  Pulse/HCG Rate: 90  Temp: 97.7 F (36.5 C) Resp: 18 BP: (!) 146/69 SpO2: 97 %  EBL: None  Complications: No immediate post-treatment complications observed by team, or reported by patient.  Note: The patient tolerated the entire procedure well. A repeat set of vitals were taken after the procedure and the patient was kept under observation following institutional policy, for this type of procedure. Post-procedural neurological assessment was performed, showing return to baseline, prior to discharge. The patient was provided with post-procedure discharge instructions, including a section on how to identify potential problems. Should any problems arise concerning this procedure, the patient was given instructions to immediately contact us, at any time, without hesitation. In any case, we plan to contact the patient by telephone for a follow-up status report regarding this interventional procedure.  Comments:  No additional relevant information.  Plan of Care   Imaging Orders  No imaging studies ordered  today    Procedure Orders     KNEE INJECTION     KNEE INJECTION  Medications ordered for procedure: Meds ordered this encounter  Medications  . lidocaine (PF) (XYLOCAINE) 1 % injection 5 mL  . ropivacaine (PF) 2 mg/mL (0.2%) (NAROPIN) injection 5 mL  . Sodium Hyaluronate SOSY 2 mL   Medications administered: We administered lidocaine (PF), ropivacaine (PF) 2 mg/mL (0.2%), and Sodium Hyaluronate.  See the medical record for exact dosing, route, and time of administration.  Disposition: Discharge home  Discharge Date & Time: 10/04/2018; 1313 hrs.   Physician-requested Follow-up: Return in about 2 weeks (around 10/18/2018) for Procedure (no sedation): (L) Hyalgan #4.  Future Appointments  Date Time Provider Department Center  10/18/2018  1:00 PM Delano Metz, MD [facility] None  11/24/2018  1:30 PM Barbette Merino, NP [facility] None  12/19/2018  1:30 PM Steele Sizer, MD [facility]  04/12/2019  9:30 AM [facility] NURSE HEALTH ADVISOR [facility]   Primary Care Physician: Steele Sizer, MD Location: [facility] Note by: Oswaldo Done, MD Date: 10/04/2018; Time: 1:28 PM  Disclaimer:  Medicine is not an Visual merchandiser. The only guarantee in medicine is that nothing is guaranteed. It is important to note that the decision to proceed with this intervention was based on the information collected from the patient. The Data and conclusions were drawn from the patient's questionnaire, the interview, and the physical examination. Because the information was provided in large part by the patient, it cannot be guaranteed that it has not been purposely or unconsciously manipulated. Every effort has been made to obtain as much relevant data as possible for this evaluation. It is important to note that the conclusions that lead to this procedure are derived in large part  from the available data. Always take into account that the treatment will also be dependent on  availability of resources and existing treatment guidelines, considered by other Pain Management Practitioners as being common knowledge and practice, at the time of the intervention. For Medico-Legal purposes, it is also important to point out that variation in procedural techniques and pharmacological choices are the acceptable norm. The indications, contraindications, technique, and results of the above procedure should only be interpreted and judged by a Board-Certified Interventional Pain Specialist with extensive familiarity and expertise in the same exact procedure and technique.

## 2018-10-05 ENCOUNTER — Telehealth: Payer: Self-pay | Admitting: *Deleted

## 2018-10-05 NOTE — Telephone Encounter (Signed)
No answer and no way to leave voicemail at the only number provided.

## 2018-10-12 ENCOUNTER — Other Ambulatory Visit: Payer: Self-pay | Admitting: Family Medicine

## 2018-10-12 DIAGNOSIS — F419 Anxiety disorder, unspecified: Secondary | ICD-10-CM

## 2018-10-12 MED ORDER — ALPRAZOLAM 0.25 MG PO TABS: 0.2500 mg | ORAL_TABLET | Freq: Every day | ORAL | 0 refills | Status: DC | PRN

## 2018-10-12 NOTE — Telephone Encounter (Signed)
Copied from CRM [id number]. Topic: Quick Communication - Rx Refill/Question >> Oct 12, 2018  4:08 PM Baldo Daub L wrote: Medication: ALPRAZolam Prudy Feeler) 0.25 MG tablet  Has the patient contacted their pharmacy? Yes - states she has no refills left (Agent: If no, request that the patient contact the pharmacy for the refill.) (Agent: If yes, when and what did the pharmacy advise?)  Preferred Pharmacy (with phone number or street name): [facility] [address], [place] - [address] [phone] (Phone) [phone] (Fax)  Agent: Please be advised that RX refills may take up to 3 business days. We ask that you follow-up with your pharmacy.

## 2018-10-18 ENCOUNTER — Other Ambulatory Visit: Payer: Self-pay

## 2018-10-18 ENCOUNTER — Encounter: Payer: Self-pay | Admitting: Pain Medicine

## 2018-10-18 ENCOUNTER — Ambulatory Visit: Payer: Medicare HMO | Attending: Pain Medicine | Admitting: Pain Medicine

## 2018-10-18 VITALS — BP 175/91 | HR 77 | Temp 97.7°F | Resp 18 | Ht 60.0 in | Wt 215.0 lb

## 2018-10-18 DIAGNOSIS — M1712 Unilateral primary osteoarthritis, left knee: Secondary | ICD-10-CM

## 2018-10-18 DIAGNOSIS — M25562 Pain in left knee: Secondary | ICD-10-CM | POA: Diagnosis not present

## 2018-10-18 DIAGNOSIS — G8929 Other chronic pain: Secondary | ICD-10-CM | POA: Diagnosis not present

## 2018-10-18 DIAGNOSIS — M25561 Pain in right knee: Secondary | ICD-10-CM

## 2018-10-18 DIAGNOSIS — Z96651 Presence of right artificial knee joint: Secondary | ICD-10-CM | POA: Diagnosis present

## 2018-10-18 MED ORDER — SODIUM HYALURONATE (VISCOSUP) 20 MG/2ML IX SOSY
2.0000 mL | PREFILLED_SYRINGE | Freq: Once | INTRA_ARTICULAR | Status: AC
  Administered 2018-10-18: 13:00:00 via INTRA_ARTICULAR

## 2018-10-18 MED ORDER — LIDOCAINE HCL (PF) 1 % IJ SOLN
5.0000 mL | Freq: Once | INTRAMUSCULAR | Status: AC
  Administered 2018-10-18: 5 mL
  Filled 2018-10-18: qty 5

## 2018-10-18 MED ORDER — ROPIVACAINE HCL 2 MG/ML IJ SOLN
5.0000 mL | Freq: Once | INTRAMUSCULAR | Status: AC
  Administered 2018-10-18: 10 mL via INTRA_ARTICULAR
  Filled 2018-10-18: qty 10

## 2018-10-18 NOTE — Progress Notes (Signed)
Safety precautions to be maintained throughout the outpatient stay will include: orient to surroundings, keep bed in low position, maintain call bell within reach at all times, provide assistance with transfer out of bed and ambulation.  

## 2018-10-18 NOTE — Patient Instructions (Addendum)
____________________________________________________________________________________________  Post-Procedure Discharge Instructions  Instructions:  Apply ice: Fill a plastic sandwich bag with crushed ice. Cover it with a small towel and apply to injection site. Apply for 15 minutes then remove x 15 minutes. Repeat sequence on day of procedure, until you go to bed. The purpose is to minimize swelling and discomfort after procedure.  Apply heat: Apply heat to procedure site starting the day following the procedure. The purpose is to treat any soreness and discomfort from the procedure.  Food intake: Start with clear liquids (like water) and advance to regular food, as tolerated.   Physical activities: Keep activities to a minimum for the first 8 hours after the procedure.   Driving: If you have received any sedation, you are not allowed to drive for 24 hours after your procedure.  Blood thinner: Restart your blood thinner 6 hours after your procedure. (Only for those taking blood thinners)  Insulin: As soon as you can eat, you may resume your normal dosing schedule. (Only for those taking insulin)  Infection prevention: Keep procedure site clean and dry.  Post-procedure Pain Diary: Extremely important that this be done correctly and accurately. Recorded information will be used to determine the next step in treatment.  Pain evaluated is that of treated area only. Do not include pain from an untreated area.  Complete every hour, on the hour, for the initial 8 hours. Set an alarm to help you do this part accurately.  Do not go to sleep and have it completed later. It will not be accurate.  Follow-up appointment: Keep your follow-up appointment after the procedure. Usually 2 weeks for most procedures. (6 weeks in the case of radiofrequency.) Bring you pain diary.   Expect:  From numbing medicine (AKA: Local Anesthetics): Numbness or decrease in pain.  Onset: Full effect within 15  minutes of injected.  Duration: It will depend on the type of local anesthetic used. On the average, 1 to 8 hours.   From steroids: Decrease in swelling or inflammation. Once inflammation is improved, relief of the pain will follow.  Onset of benefits: Depends on the amount of swelling present. The more swelling, the longer it will take for the benefits to be seen. In some cases, up to 10 days.  Duration: Steroids will stay in the system x 2 weeks. Duration of benefits will depend on multiple posibilities including persistent irritating factors.  Occasional side-effects: Facial flushing (red, warm cheeks) , cramps (if present, drink Gatorade and take over-the-counter Magnesium 450-500 mg once to twice a day).  From procedure: Some discomfort is to be expected once the numbing medicine wears off. This should be minimal if ice and heat are applied as instructed.  Call if:  You experience numbness and weakness that gets worse with time, as opposed to wearing off.  New onset bowel or bladder incontinence. (This applies to Spinal procedures only)  Emergency Numbers:  Durning business hours (Monday - Thursday, 8:00 AM - 4:00 PM) (Friday, 9:00 AM - 12:00 Noon): [phone]  After hours: [phone] ____________________________________________________________________________________________   ____________________________________________________________________________________________  Preparing for your procedure (without sedation)  Instructions: . Oral Intake: Do not eat or drink anything for at least 3 hours prior to your procedure. . Transportation: Unless otherwise stated by your physician, you may drive yourself after the procedure. . Blood Pressure Medicine: Take your blood pressure medicine with a sip of water the morning of the procedure. . Blood thinners: Notify our staff if you are taking any blood   thinners. Depending on which one you take, there will be specific  instructions on how and when to stop it. . Diabetics on insulin: Notify the staff so that you can be scheduled 1st case in the morning. If your diabetes requires high dose insulin, take only  of your normal insulin dose the morning of the procedure and notify the staff that you have done so. . Preventing infections: Shower with an antibacterial soap the morning of your procedure.  . Build-up your immune system: Take 1000 mg of Vitamin C with every meal (3 times a day) the day prior to your procedure. . Antibiotics: Inform the staff if you have a condition or reason that requires you to take antibiotics before dental procedures. . Pregnancy: If you are pregnant, call and cancel the procedure. . Sickness: If you have a cold, fever, or any active infections, call and cancel the procedure. . Arrival: You must be in the facility at least 30 minutes prior to your scheduled procedure. . Children: Do not bring any children with you. . Dress appropriately: Bring dark clothing that you would not mind if they get stained. . Valuables: Do not bring any jewelry or valuables.  Procedure appointments are reserved for interventional treatments only. . No Prescription Refills. . No medication changes will be discussed during procedure appointments. . No disability issues will be discussed.  Reasons to call and reschedule or cancel your procedure: (Following these recommendations will minimize the risk of a serious complication.) . Surgeries: Avoid having procedures within 2 weeks of any surgery. (Avoid for 2 weeks before or after any surgery). . Flu Shots: Avoid having procedures within 2 weeks of a flu shots or . (Avoid for 2 weeks before or after immunizations). . Barium: Avoid having a procedure within 7-10 days after having had a radiological study involving the use of radiological contrast. (Myelograms, Barium swallow or enema study). . Heart attacks: Avoid any elective procedures or surgeries for the  initial 6 months after a "Myocardial Infarction" (Heart Attack). . Blood thinners: It is imperative that you stop these medications before procedures. Let us know if you if you take any blood thinner.  . Infection: Avoid procedures during or within two weeks of an infection (including chest colds or gastrointestinal problems). Symptoms associated with infections include: Localized redness, fever, chills, night sweats or profuse sweating, burning sensation when voiding, cough, congestion, stuffiness, runny nose, sore throat, diarrhea, nausea, vomiting, cold or Flu symptoms, recent or current infections. It is specially important if the infection is over the area that we intend to treat. . Heart and lung problems: Symptoms that may suggest an active cardiopulmonary problem include: cough, chest pain, breathing difficulties or shortness of breath, dizziness, ankle swelling, uncontrolled high or unusually low blood pressure, and/or palpitations. If you are experiencing any of these symptoms, cancel your procedure and contact your primary care physician for an evaluation.  Remember:  Regular Business hours are:  Monday to Thursday 8:00 AM to 4:00 PM  Provider's Schedule: Anagabriela Jokerst, MD:  Procedure days: Tuesday and Thursday 7:30 AM to 4:00 PM  Bilal Lateef, MD:  Procedure days: Monday and Wednesday 7:30 AM to 4:00 PM ____________________________________________________________________________________________   ____________________________________________________________________________________________  Post-procedure Information What to expect: Most procedures involve the use of a local anesthetic (numbing medicine), and a steroid (anti-inflammatory medicine).  The local anesthetics may cause temporary numbness and weakness of the legs or arms, depending on the location of the block. This numbness/weakness may last 4-6 hours, depending on   the local anesthetic used. In rare instances, it can  last up to 24 hours. While numb, you must be very careful not to injure the extremity.  After any procedure, you could expect the pain to get better within 15-20 minutes. This relief is temporary and may last 4-6 hours. Once the local anesthetics wears off, you could experience discomfort, possibly more than usual, for up to 10 (ten) days. In the case of radiofrequencies, it may last up to 6 weeks. Surgeries may take up to 8 weeks for the healing process. The discomfort is due to the irritation caused by needles going through skin and muscle. To minimize the discomfort, we recommend using ice the first day, and heat from then on. The ice should be applied for 15 minutes on, and 15 minutes off. Keep repeating this cycle until bedtime. Avoid applying the ice directly to the skin, to prevent frostbite. Heat should be used daily, until the pain improves (4-10 days). Be careful not to burn yourself.  Occasionally you may experience muscle spasms or cramps. These occur as a consequence of the irritation caused by the needle sticks to the muscle and the blood that will inevitably be lost into the surrounding muscle tissue. Blood tends to be very irritating to tissues, which tend to react by going into spasm. These spasms may start the same day of your procedure, but they may also take days to develop. This late onset type of spasm or cramp is usually caused by electrolyte imbalances triggered by the steroids, at the level of the kidney. Cramps and spasms tend to respond well to muscle relaxants, multivitamins (some are triggered by the procedure, but may have their origins in vitamin deficiencies), and "Gatorade", or any sports drinks that can replenish any electrolyte imbalances. (If you are a diabetic, ask your pharmacist to get you a sugar-free brand.) Warm showers or baths may also be helpful. Stretching exercises are highly recommended.  General Instructions:  Be alert for signs of possible infection: redness,  swelling, heat, red streaks, elevated temperature, and/or fever. These typically appear 4 to 6 days after the procedure. Immediately notify your doctor if you experience unusual bleeding, difficulty breathing, or loss of bowel or bladder control. If you experience increased pain, do not increase your pain medicine intake, unless instructed by your pain physician.  Post-Procedure Care:  Be careful in moving about. Muscle spasms in the area of the injection may occur. Applying ice or heat to the area is often helpful. The incidence of spinal headaches after epidural injections ranges between 1.4% and 6%. If you develop a headache that does not seem to respond to conservative therapy, please let your physician know. This can be treated with an epidural blood patch.   Post-procedure numbness or redness is to be expected, however it should average 4 to 6 hours. If numbness and weakness of your extremities begins to develop 4 to 6 hours after your procedure, and is felt to be progressing and worsening, immediately contact your physician.  Diet:  If you experience nausea, do not eat until this sensation goes away. If you had a "Stellate Ganglion Block" for upper extremity "Reflex Sympathetic Dystrophy", do not eat or drink until your hoarseness goes away. In any case, always start with liquids first and if you tolerate them well, then slowly progress to more solid foods.  Activity:  For the first 4 to 6 hours after the procedure, use caution in moving about as you may experience numbness and/or weakness.   Use caution in cooking, using household electrical appliances, and climbing steps. If you need to reach your Doctor call our office: [phone] (During business hours) or [phone] (After business hours).  Business Hours: Monday-Thursday 8:00 am - 4:00 PM    Fridays: Closed     In case of an emergency: In case of emergency, call 911 or go to the nearest emergency room and have the physician  there call us.  Interpretation of Procedure Every nerve block has two components: a diagnostic component, and a treatment component. Unrealistic expectations are the most common causes of "perceived failure".  In a perfect world, a single nerve block should be able to completely and permanently eliminate the pain. Sadly, the world is not perfect.  Most pain management nerve blocks are performed using local anesthetics and steroids. Steroids are responsible for any long-term benefit that you may experience. Their purpose is to decrease any chronic swelling that may exist in the area. Steroids begin to work immediately after being injected. However, most patients will not experience any benefits until 5 to 10 days after the injection, when the swelling has come down to the point where they can tell a difference. Steroids will only help if there is swelling to be treated. As such, they can assist with the diagnosis. If effective, they suggest an inflammatory component to the pain, and if ineffective, they rule out inflammation as the main cause or component of the problem. If the problem is one of mechanical compression, you will get no benefit from those steroids.   In the case of local anesthetics, they have a crucial role in the diagnosis of your condition. Most will begin to work within15 to 20 minutes after injection. The duration will depend on the type used (short- vs. Long-acting). It is of outmost importance that patients keep tract of their pain, after the procedure. To assist with this matter, a "Post-procedure Pain Diary" is provided. Make sure to complete it and to bring it back to your follow-up appointment.  As long as the patient keeps accurate, detailed records of their symptoms after every procedure, and returns to have those interpreted, every procedure will provide us with invaluable information. Even a block that does not provide the patient with any relief, will always provide us with  information about the mechanism and the origin of the pain. The only time a nerve block can be considered a waste of time is when patients do not keep track of the results, or do not keep their post-procedure appointment.  Reporting the results back to your physician The Pain Score  Pain is a subjective complaint. It cannot be seen, touched, or measured. We depend entirely on the patient's report of the pain in order to assess your condition and treatment. To evaluate the pain, we use a pain scale, where "0" means "No Pain", and a "10" is "the worst possible pain that you can even imagine" (i.e. something like been eaten alive by a shark or being torn apart by a lion).   Use the Pain Scale provided. You will frequently be asked to rate your pain. Please be accurate, remember that medical decisions will be based on your responses. Please do not rate your pain above a 10. Doing so is actually interpreted as "symptom magnification" (exaggeration). To put this into perspective, when you tell us that your pain is at a 10 (ten), what you are saying is that there is nothing we can do to make this pain   any worse. (Carefully think about that.) ____________________________________________________________________________________________    

## 2018-10-18 NOTE — Progress Notes (Signed)
Patient's Name: Cynthia Jones  MRN: [id number]  Referring Provider: Steele Sizer, MD  DOB: [birth year]  PCP: Steele Sizer, MD  DOS: 10/18/2018  Note by: Oswaldo Done, MD  Service setting: Ambulatory outpatient  Specialty: Interventional Pain Management  Patient type: Established  Location: [facility] (AMB) Pain Management Facility  Visit type: Interventional Procedure   Primary Reason for Visit: Interventional Pain Management Treatment. CC: Knee Pain (left)  Procedure:          Anesthesia, Analgesia, Anxiolysis:  Type: Therapeutic Intra-Articular Hyalgan Knee Injection #4  Region: Lateral infrapatellar Knee Region Level: Knee Joint Laterality: Left knee  Type: Local Anesthesia Indication(s): Analgesia         Local Anesthetic: Lidocaine 1-2% Route: Infiltration ([place]/IM) IV Access: Declined Sedation: Declined   Position: Sitting   Indications: 1. Chronic knee pain (Primary Area of Pain) (Bilateral) (L>R)   2. Osteoarthritis of knee (Left)   3. History of total knee replacement (Right)    Pain Score: Pre-procedure: 3 /10 Post-procedure: 3 /10  Pre-op Assessment:  Cynthia Jones is a 78 y.o. (year old), female patient, seen today for interventional treatment. She  has a past surgical history that includes Parotidectomy (Left); Appendectomy; Cholecystectomy; Abdominal hysterectomy; Tonsillectomy; Joint replacement (2014); Eye surgery; and Breast cyst aspiration (Right, 04/18/2013). Cynthia Jones has a current medication list which includes the following prescription(s): alprazolam, atorvastatin, carvedilol, vitamin d3, duloxetine, esomeprazole, glucose blood, levothyroxine, losartan, magnesium, NONFORMULARY OR COMPOUNDED ITEM, nystatin, oxycodone, oxycodone, calcium carbonate, and oxycodone, and the following Facility-Administered Medications: lidocaine (pf), ropivacaine (pf) 2 mg/ml (0.2%), and sodium hyaluronate. Her primarily concern today is the Knee Pain (left)  Initial  Vital Signs:  Pulse/HCG Rate: 77  Temp: 97.7 F (36.5 C) Resp: 18 BP: (!) 175/91 SpO2: 98 %  BMI: Estimated body mass index is 41.99 kg/m as calculated from the following:   Height as of this encounter: 5' (1.524 m).   Weight as of this encounter: 215 lb (97.5 kg).  Risk Assessment: Allergies: Reviewed. She is allergic to nsaids and erythromycin.  Allergy Precautions: None required Coagulopathies: Reviewed. None identified.  Blood-thinner therapy: None at this time Active Infection(s): Reviewed. None identified. Cynthia Jones is afebrile  Site Confirmation: Cynthia Jones was asked to confirm the procedure and laterality before marking the site Procedure checklist: Completed Consent: Before the procedure and under the influence of no sedative(s), amnesic(s), or anxiolytics, the patient was informed of the treatment options, risks and possible complications. To fulfill our ethical and legal obligations, as recommended by the American Medical Association's Code of Ethics, I have informed the patient of my clinical impression; the nature and purpose of the treatment or procedure; the risks, benefits, and possible complications of the intervention; the alternatives, including doing nothing; the risk(s) and benefit(s) of the alternative treatment(s) or procedure(s); and the risk(s) and benefit(s) of doing nothing. The patient was provided information about the general risks and possible complications associated with the procedure. These may include, but are not limited to: failure to achieve desired goals, infection, bleeding, organ or nerve damage, allergic reactions, paralysis, and death. In addition, the patient was informed of those risks and complications associated to the procedure, such as failure to decrease pain; infection; bleeding; organ or nerve damage with subsequent damage to sensory, motor, and/or autonomic systems, resulting in permanent pain, numbness, and/or weakness of one or  several areas of the body; allergic reactions; (i.e.: anaphylactic reaction); and/or death. Furthermore, the patient was informed of those risks and complications  associated with the medications. These include, but are not limited to: allergic reactions (i.e.: anaphylactic or anaphylactoid reaction(s)); adrenal axis suppression; blood sugar elevation that in diabetics may result in ketoacidosis or comma; water retention that in patients with history of congestive heart failure may result in shortness of breath, pulmonary edema, and decompensation with resultant heart failure; weight gain; swelling or edema; medication-induced neural toxicity; particulate matter embolism and blood vessel occlusion with resultant organ, and/or nervous system infarction; and/or aseptic necrosis of one or more joints. Finally, the patient was informed that Medicine is not an exact science; therefore, there is also the possibility of unforeseen or unpredictable risks and/or possible complications that may result in a catastrophic outcome. The patient indicated having understood very clearly. We have given the patient no guarantees and we have made no promises. Enough time was given to the patient to ask questions, all of which were answered to the patient's satisfaction. Cynthia Jones has indicated that she wanted to continue with the procedure. Attestation: I, the ordering provider, attest that I have discussed with the patient the benefits, risks, side-effects, alternatives, likelihood of achieving goals, and potential problems during recovery for the procedure that I have provided informed consent. Date  Time: 10/18/2018  1:14 PM  Pre-Procedure Preparation:  Monitoring: As per clinic protocol. Respiration, ETCO2, SpO2, BP, heart rate and rhythm monitor placed and checked for adequate function Safety Precautions: Patient was assessed for positional comfort and pressure points before starting the procedure. Time-out: I  initiated and conducted the "Time-out" before starting the procedure, as per protocol. The patient was asked to participate by confirming the accuracy of the "Time Out" information. Verification of the correct person, site, and procedure were performed and confirmed by me, the nursing staff, and the patient. "Time-out" conducted as per Joint Commission's Universal Protocol (UP.01.01.01). Time: 1326  Description of Procedure:          Target Area: Knee Joint Approach: Just above the Lateral tibial plateau, lateral to the infrapatellar tendon. Area Prepped: Entire knee area, from the mid-thigh to the mid-shin. Prepping solution: ChloraPrep (2% chlorhexidine gluconate and 70% isopropyl alcohol) Safety Precautions: Aspiration looking for blood return was conducted prior to all injections. At no point did we inject any substances, as a needle was being advanced. No attempts were made at seeking any paresthesias. Safe injection practices and needle disposal techniques used. Medications properly checked for expiration dates. SDV (single dose vial) medications used. Description of the Procedure: Protocol guidelines were followed. The patient was placed in position over the fluoroscopy table. The target area was identified and the area prepped in the usual manner. Skin & deeper tissues infiltrated with local anesthetic. Appropriate amount of time allowed to pass for local anesthetics to take effect. The procedure needles were then advanced to the target area. Proper needle placement secured. Negative aspiration confirmed. Solution injected in intermittent fashion, asking for systemic symptoms every 0.5cc of injectate. The needles were then removed and the area cleansed, making sure to leave some of the prepping solution back to take advantage of its long term bactericidal properties. Vitals:   10/18/18 1314 10/18/18 1328  BP: (!) 175/91   Pulse: 77   Resp: 18 18  Temp: 97.7 F (36.5 C)   TempSrc: Oral    SpO2: 98%   Weight: 215 lb (97.5 kg)   Height: 5' (1.524 m)     Start Time: 1326 hrs. End Time: 1327 hrs. Materials:  Needle(s) Type: Regular needle Gauge: 25G Length: 1.5-in  Medication(s): Please see orders for medications and dosing details.  Imaging Guidance:          Type of Imaging Technique: None used Indication(s): N/A Exposure Time: No patient exposure Contrast: None used. Fluoroscopic Guidance: N/A Ultrasound Guidance: N/A Interpretation: N/A  Antibiotic Prophylaxis:   Anti-infectives (From admission, onward)   None     Indication(s): None identified  Post-operative Assessment:  Post-procedure Vital Signs:  Pulse/HCG Rate: 77  Temp: 97.7 F (36.5 C) Resp: 18 BP: (!) 175/91 SpO2: 98 %  EBL: None  Complications: No immediate post-treatment complications observed by team, or reported by patient.  Note: The patient tolerated the entire procedure well. A repeat set of vitals were taken after the procedure and the patient was kept under observation following institutional policy, for this type of procedure. Post-procedural neurological assessment was performed, showing return to baseline, prior to discharge. The patient was provided with post-procedure discharge instructions, including a section on how to identify potential problems. Should any problems arise concerning this procedure, the patient was given instructions to immediately contact us, at any time, without hesitation. In any case, we plan to contact the patient by telephone for a follow-up status report regarding this interventional procedure.  Comments:  No additional relevant information.  Plan of Care   Imaging Orders  No imaging studies ordered today    Procedure Orders     KNEE INJECTION     KNEE INJECTION  Medications ordered for procedure: Meds ordered this encounter  Medications  . lidocaine (PF) (XYLOCAINE) 1 % injection 5 mL  . ropivacaine (PF) 2 mg/mL (0.2%) (NAROPIN) injection 5  mL  . Sodium Hyaluronate SOSY 2 mL   Medications administered: Andree MoroShirley G. Jones had no medications administered during this visit.  See the medical record for exact dosing, route, and time of administration.  Disposition: Discharge home  Discharge Date & Time: 10/18/2018; 1330 hrs.   Physician-requested Follow-up: Return for Procedure (no sedation) (2 wks): (L) Hyalgan #5.  Future Appointments  Date Time Provider Department Center  11/24/2018  1:30 PM Barbette MerinoKing, Crystal M, NP [facility] None  12/19/2018  1:30 PM Steele Sizerrissman, Mark A, MD [facility]  04/12/2019  9:30 AM [facility] NURSE HEALTH ADVISOR [facility]   Primary Care Physician: Steele Sizerrissman, Mark A, MD [facility] Note by: Oswaldo DoneFrancisco A Sherra Kimmons, MD Date: 10/18/2018; Time: 1:29 PM  Disclaimer:  Medicine is not an Visual merchandiserexact science. The only guarantee in medicine is that nothing is guaranteed. It is important to note that the decision to proceed with this intervention was based on the information collected from the patient. The Data and conclusions were drawn from the patient's questionnaire, the interview, and the physical examination. Because the information was provided in large part by the patient, it cannot be guaranteed that it has not been purposely or unconsciously manipulated. Every effort has been made to obtain as much relevant data as possible for this evaluation. It is important to note that the conclusions that lead to this procedure are derived in large part from the available data. Always take into account that the treatment will also be dependent on availability of resources and existing treatment guidelines, considered by other Pain Management Practitioners as being common knowledge and practice, at the time of the intervention. For Medico-Legal purposes, it is also important to point out that variation in procedural techniques and pharmacological choices are the acceptable norm. The indications,  contraindications, technique, and results of the above procedure should only be  interpreted and judged by a Board-Certified Interventional Pain Specialist with extensive familiarity and expertise in the same exact procedure and technique.

## 2018-10-19 ENCOUNTER — Telehealth: Payer: Self-pay | Admitting: *Deleted

## 2018-10-19 NOTE — Telephone Encounter (Signed)
Attempted to call for post procedure follow-up. Unable to leave a message. 

## 2018-10-20 ENCOUNTER — Other Ambulatory Visit: Payer: Self-pay

## 2018-10-20 ENCOUNTER — Encounter: Payer: Self-pay | Admitting: Family Medicine

## 2018-10-20 ENCOUNTER — Ambulatory Visit (INDEPENDENT_AMBULATORY_CARE_PROVIDER_SITE_OTHER): Payer: Medicare HMO | Admitting: Family Medicine

## 2018-10-20 VITALS — BP 134/85 | HR 66 | Temp 97.7°F | Ht 58.9 in | Wt 210.0 lb

## 2018-10-20 DIAGNOSIS — F419 Anxiety disorder, unspecified: Secondary | ICD-10-CM | POA: Diagnosis not present

## 2018-10-20 DIAGNOSIS — E7800 Pure hypercholesterolemia, unspecified: Secondary | ICD-10-CM | POA: Diagnosis not present

## 2018-10-20 DIAGNOSIS — R69 Illness, unspecified: Secondary | ICD-10-CM | POA: Diagnosis not present

## 2018-10-20 MED ORDER — ALPRAZOLAM 0.25 MG PO TABS: 0.2500 mg | ORAL_TABLET | Freq: Every day | ORAL | 0 refills | Status: DC | PRN

## 2018-10-20 NOTE — Assessment & Plan Note (Signed)
Unclear yet if her pain is medication related or just some flares of her underlying fibromyalgia and chronic pain syndrome. Will continue the medication a bit longer and reassess at upcoming f/u.

## 2018-10-20 NOTE — Progress Notes (Signed)
BP 134/85   Pulse 66   Temp 97.7 F (36.5 C) (Oral)   Ht 4' 10.9" (1.496 m)   Wt 210 lb (95.3 kg)   SpO2 97%   BMI 42.56 kg/m    Subjective:    Patient ID: Cynthia Jones, female    DOB: May 12, 1941, 78 y.o.   MRN: [id number]  HPI: Cynthia Jones is a 78 y.o. female  Chief Complaint  Patient presents with  . Medication Management    xanax and Lipitor   Started low dose lipitor last month for hyperlipidemia. Occasional leg cramps but unsure if it's the new lipitor or her fibromyalgia and arthritis which has been acting up with the weather changes.   Coming up on the anniversary of her husband's passing next week and struggling with anxiety some days. Rarely takes the xanax but her script is from 2017 and expired, more was sent in by PCP last week but it was printed so never came to pharmacy. Requesting it to be re-sent. Takes very sparingly and never in combination with her pain medication that is given by pain clinic.   Relevant past medical, surgical, family and social history reviewed and updated as indicated. Interim medical history since our last visit reviewed. Allergies and medications reviewed and updated.  Review of Systems  Per HPI unless specifically indicated above     Objective:    BP 134/85   Pulse 66   Temp 97.7 F (36.5 C) (Oral)   Ht 4' 10.9" (1.496 m)   Wt 210 lb (95.3 kg)   SpO2 97%   BMI 42.56 kg/m   Wt Readings from Last 3 Encounters:  10/20/18 210 lb (95.3 kg)  10/18/18 215 lb (97.5 kg)  10/04/18 215 lb (97.5 kg)    Physical Exam Vitals signs and nursing note reviewed.  Constitutional:      Appearance: Normal appearance. She is not ill-appearing.  HENT:     Head: Atraumatic.  Eyes:     Extraocular Movements: Extraocular movements intact.     Conjunctiva/sclera: Conjunctivae normal.  Neck:     Musculoskeletal: Normal range of motion and neck supple.  Cardiovascular:     Rate and Rhythm: Normal rate and regular rhythm.   Heart sounds: Normal heart sounds.  Pulmonary:     Effort: Pulmonary effort is normal.     Breath sounds: Normal breath sounds.  Musculoskeletal: Normal range of motion.  Skin:    General: Skin is warm and dry.  Neurological:     Mental Status: She is alert and oriented to person, place, and time.  Psychiatric:        Mood and Affect: Mood normal.        Thought Content: Thought content normal.        Judgment: Judgment normal.     Results for orders placed or performed in visit on 09/20/18  HM DIABETES EYE EXAM  Result Value Ref Range   HM Diabetic Eye Exam No Retinopathy No Retinopathy      Assessment & Plan:   Problem List Items Addressed This Visit      Other   Hypercholesteremia    Unclear yet if her pain is medication related or just some flares of her underlying fibromyalgia and chronic pain syndrome. Will continue the medication a bit longer and reassess at upcoming f/u.        Other Visit Diagnoses    Anxiety    -  Primary   Will re-write script  as recent script written by PCP was printed on accident and never received by pharmacy. Continue judicious use for anxiety flares   Relevant Medications   ALPRAZolam (XANAX) 0.25 MG tablet       Follow up plan: Return for as scheduled.

## 2018-11-01 ENCOUNTER — Other Ambulatory Visit: Payer: Self-pay

## 2018-11-01 ENCOUNTER — Ambulatory Visit: Payer: Medicare HMO | Attending: Pain Medicine | Admitting: Pain Medicine

## 2018-11-01 ENCOUNTER — Encounter: Payer: Self-pay | Admitting: Pain Medicine

## 2018-11-01 VITALS — BP 137/64 | HR 73 | Temp 98.0°F | Resp 18 | Ht 60.0 in | Wt 210.0 lb

## 2018-11-01 DIAGNOSIS — M797 Fibromyalgia: Secondary | ICD-10-CM | POA: Insufficient documentation

## 2018-11-01 DIAGNOSIS — M1712 Unilateral primary osteoarthritis, left knee: Secondary | ICD-10-CM | POA: Insufficient documentation

## 2018-11-01 DIAGNOSIS — G8929 Other chronic pain: Secondary | ICD-10-CM

## 2018-11-01 DIAGNOSIS — M792 Neuralgia and neuritis, unspecified: Secondary | ICD-10-CM | POA: Insufficient documentation

## 2018-11-01 DIAGNOSIS — M25561 Pain in right knee: Secondary | ICD-10-CM | POA: Insufficient documentation

## 2018-11-01 DIAGNOSIS — M25562 Pain in left knee: Secondary | ICD-10-CM | POA: Insufficient documentation

## 2018-11-01 MED ORDER — ROPIVACAINE HCL 2 MG/ML IJ SOLN
INTRAMUSCULAR | Status: AC
  Filled 2018-11-01: qty 10

## 2018-11-01 MED ORDER — DICLOFENAC SODIUM 1 % TD GEL: 2.0000 g | Freq: Four times a day (QID) | TRANSDERMAL | 99 refills | Status: DC

## 2018-11-01 MED ORDER — ROPIVACAINE HCL 2 MG/ML IJ SOLN
5.0000 mL | Freq: Once | INTRAMUSCULAR | Status: AC
  Administered 2018-11-01: 10 mL via INTRA_ARTICULAR

## 2018-11-01 MED ORDER — LIDOCAINE HCL (PF) 1 % IJ SOLN
INTRAMUSCULAR | Status: AC
  Filled 2018-11-01: qty 5

## 2018-11-01 MED ORDER — LIDOCAINE HCL (PF) 1 % IJ SOLN
5.0000 mL | Freq: Once | INTRAMUSCULAR | Status: AC
  Administered 2018-11-01: 5 mL

## 2018-11-01 MED ORDER — SODIUM HYALURONATE (VISCOSUP) 20 MG/2ML IX SOSY
2.0000 mL | PREFILLED_SYRINGE | Freq: Once | INTRA_ARTICULAR | Status: AC
  Administered 2018-11-01: 2 mL via INTRA_ARTICULAR

## 2018-11-01 NOTE — Progress Notes (Signed)
Patient's Name: Cynthia Jones  MRN: [id number]  Referring Provider: Steele Sizer, MD  DOB: [birth year]  PCP: Steele Sizer, MD  DOS: 11/01/2018  Note by: Oswaldo Done, MD  Service setting: Ambulatory outpatient  Specialty: Interventional Pain Management  Patient type: Established  Location: [facility] (AMB) Pain Management Facility  Visit type: Interventional Procedure   Primary Reason for Visit: Interventional Pain Management Treatment. CC: Knee Pain (left); Shoulder Pain; Arm Pain; Hand Pain; and Leg Pain  Procedure:          Anesthesia, Analgesia, Anxiolysis:  Type: Therapeutic Intra-Articular Hyalgan Knee Injection #5  Region: Lateral infrapatellar Knee Region Level: Knee Joint Laterality: Left knee  Type: Local Anesthesia Indication(s): Analgesia         Local Anesthetic: Lidocaine 1-2% Route: Infiltration ([place]/IM) IV Access: Declined Sedation: Declined   Position: Sitting   Indications: 1. Osteoarthritis of knee (Left)   2. Chronic knee pain (Primary Area of Pain) (Bilateral) (L>R)   3. Fibromyalgia   4. Neurogenic pain    Pain Score: Pre-procedure: 5 /10 Post-procedure: 0-No pain/10  Pre-op Assessment:  Cynthia Jones is a 78 y.o. (year old), female patient, seen today for interventional treatment. She  has a past surgical history that includes Parotidectomy (Left); Appendectomy; Cholecystectomy; Abdominal hysterectomy; Tonsillectomy; Joint replacement (2014); Eye surgery; and Breast cyst aspiration (Right, 04/18/2013). Cynthia Jones has a current medication list which includes the following prescription(s): alprazolam, atorvastatin, calcium carbonate, carvedilol, vitamin d3, duloxetine, esomeprazole, glucose blood, levothyroxine, losartan, magnesium, NONFORMULARY OR COMPOUNDED ITEM, nystatin, oxycodone, and diclofenac sodium. Her primarily concern today is the Knee Pain (left); Shoulder Pain; Arm Pain; Hand Pain; and Leg Pain  Initial Vital Signs:  Pulse/HCG Rate:  71  Temp: 98 F (36.7 C) Resp: 18 BP: (!) 145/60 SpO2: 96 %  BMI: Estimated body mass index is 41.01 kg/m as calculated from the following:   Height as of this encounter: 5' (1.524 m).   Weight as of this encounter: 210 lb (95.3 kg).  Risk Assessment: Allergies: Reviewed. She is allergic to nsaids and erythromycin.  Allergy Precautions: None required Coagulopathies: Reviewed. None identified.  Blood-thinner therapy: None at this time Active Infection(s): Reviewed. None identified. Cynthia Jones is afebrile  Site Confirmation: Cynthia Jones was asked to confirm the procedure and laterality before marking the site Procedure checklist: Completed Consent: Before the procedure and under the influence of no sedative(s), amnesic(s), or anxiolytics, the patient was informed of the treatment options, risks and possible complications. To fulfill our ethical and legal obligations, as recommended by the American Medical Association's Code of Ethics, I have informed the patient of my clinical impression; the nature and purpose of the treatment or procedure; the risks, benefits, and possible complications of the intervention; the alternatives, including doing nothing; the risk(s) and benefit(s) of the alternative treatment(s) or procedure(s); and the risk(s) and benefit(s) of doing nothing. The patient was provided information about the general risks and possible complications associated with the procedure. These may include, but are not limited to: failure to achieve desired goals, infection, bleeding, organ or nerve damage, allergic reactions, paralysis, and death. In addition, the patient was informed of those risks and complications associated to the procedure, such as failure to decrease pain; infection; bleeding; organ or nerve damage with subsequent damage to sensory, motor, and/or autonomic systems, resulting in permanent pain, numbness, and/or weakness of one or several areas of the body; allergic  reactions; (i.e.: anaphylactic reaction); and/or death. Furthermore, the patient was informed of those  risks and complications associated with the medications. These include, but are not limited to: allergic reactions (i.e.: anaphylactic or anaphylactoid reaction(s)); adrenal axis suppression; blood sugar elevation that in diabetics may result in ketoacidosis or comma; water retention that in patients with history of congestive heart failure may result in shortness of breath, pulmonary edema, and decompensation with resultant heart failure; weight gain; swelling or edema; medication-induced neural toxicity; particulate matter embolism and blood vessel occlusion with resultant organ, and/or nervous system infarction; and/or aseptic necrosis of one or more joints. Finally, the patient was informed that Medicine is not an exact science; therefore, there is also the possibility of unforeseen or unpredictable risks and/or possible complications that may result in a catastrophic outcome. The patient indicated having understood very clearly. We have given the patient no guarantees and we have made no promises. Enough time was given to the patient to ask questions, all of which were answered to the patient's satisfaction. Cynthia Jones has indicated that she wanted to continue with the procedure. Attestation: I, the ordering provider, attest that I have discussed with the patient the benefits, risks, side-effects, alternatives, likelihood of achieving goals, and potential problems during recovery for the procedure that I have provided informed consent. Date  Time: 11/01/2018 12:41 PM  Pre-Procedure Preparation:  Monitoring: As per clinic protocol. Respiration, ETCO2, SpO2, BP, heart rate and rhythm monitor placed and checked for adequate function Safety Precautions: Patient was assessed for positional comfort and pressure points before starting the procedure. Time-out: I initiated and conducted the "Time-out" before  starting the procedure, as per protocol. The patient was asked to participate by confirming the accuracy of the "Time Out" information. Verification of the correct person, site, and procedure were performed and confirmed by me, the nursing staff, and the patient. "Time-out" conducted as per Joint Commission's Universal Protocol (UP.01.01.01). Time: 1253  Description of Procedure:          Target Area: Knee Joint Approach: Just above the Lateral tibial plateau, lateral to the infrapatellar tendon. Area Prepped: Entire knee area, from the mid-thigh to the mid-shin. Prepping solution: ChloraPrep (2% chlorhexidine gluconate and 70% isopropyl alcohol) Safety Precautions: Aspiration looking for blood return was conducted prior to all injections. At no point did we inject any substances, as a needle was being advanced. No attempts were made at seeking any paresthesias. Safe injection practices and needle disposal techniques used. Medications properly checked for expiration dates. SDV (single dose vial) medications used. Description of the Procedure: Protocol guidelines were followed. The patient was placed in position over the fluoroscopy table. The target area was identified and the area prepped in the usual manner. Skin & deeper tissues infiltrated with local anesthetic. Appropriate amount of time allowed to pass for local anesthetics to take effect. The procedure needles were then advanced to the target area. Proper needle placement secured. Negative aspiration confirmed. Solution injected in intermittent fashion, asking for systemic symptoms every 0.5cc of injectate. The needles were then removed and the area cleansed, making sure to leave some of the prepping solution back to take advantage of its long term bactericidal properties. Vitals:   11/01/18 1240 11/01/18 1305  BP: (!) 145/60 137/64  Pulse: 71 73  Resp: 18 18  Temp: 98 F (36.7 C)   SpO2: 96% 98%  Weight: 210 lb (95.3 kg)   Height: 5'  (1.524 m)     Start Time: 1253 hrs. End Time: 1255 hrs. Materials:  Needle(s) Type: Regular needle Gauge: 25G Length: 1.5-in Medication(s): Please  see orders for medications and dosing details.  Imaging Guidance:          Type of Imaging Technique: None used Indication(s): N/A Exposure Time: No patient exposure Contrast: None used. Fluoroscopic Guidance: N/A Ultrasound Guidance: N/A Interpretation: N/A  Antibiotic Prophylaxis:   Anti-infectives (From admission, onward)   None     Indication(s): None identified  Post-operative Assessment:  Post-procedure Vital Signs:  Pulse/HCG Rate: 73  Temp: 98 F (36.7 C) Resp: 18 BP: 137/64 SpO2: 98 %  EBL: None  Complications: No immediate post-treatment complications observed by team, or reported by patient.  Note: The patient tolerated the entire procedure well. A repeat set of vitals were taken after the procedure and the patient was kept under observation following institutional policy, for this type of procedure. Post-procedural neurological assessment was performed, showing return to baseline, prior to discharge. The patient was provided with post-procedure discharge instructions, including a section on how to identify potential problems. Should any problems arise concerning this procedure, the patient was given instructions to immediately contact us, at any time, without hesitation. In any case, we plan to contact the patient by telephone for a follow-up status report regarding this interventional procedure.  Comments:  No additional relevant information.  Plan of Care   Imaging Orders  No imaging studies ordered today    Procedure Orders     KNEE INJECTION  Medications ordered for procedure: Meds ordered this encounter  Medications  . lidocaine (PF) (XYLOCAINE) 1 % injection 5 mL  . ropivacaine (PF) 2 mg/mL (0.2%) (NAROPIN) injection 5 mL  . Sodium Hyaluronate SOSY 2 mL  . diclofenac sodium (VOLTAREN) 1 % GEL     Sig: Apply 2 g topically 4 (four) times daily for 30 days.    Dispense:  100 g    Refill:  PRN    Do not place medication on "Automatic Refill". Fill one day early if pharmacy is closed on scheduled refill date.   Medications administered: We administered lidocaine (PF), ropivacaine (PF) 2 mg/mL (0.2%), and Sodium Hyaluronate.  See the medical record for exact dosing, route, and time of administration.  Disposition: Discharge home  Discharge Date & Time: 11/01/2018; 1307 hrs.   Physician-requested Follow-up: Return for PPE (2 wks) w/ Dr. Laban Emperor.  Future Appointments  Date Time Provider Department Center  11/24/2018  1:30 PM Barbette Merino, NP [facility] None  12/19/2018  1:30 PM Steele Sizer, MD [facility]  04/12/2019  9:30 AM [facility] NURSE HEALTH ADVISOR [facility]   Primary Care Physician: Steele Sizer, MD Location: [facility] Note by: Oswaldo Done, MD Date: 11/01/2018; Time: 1:43 PM  Disclaimer:  Medicine is not an Visual merchandiser. The only guarantee in medicine is that nothing is guaranteed. It is important to note that the decision to proceed with this intervention was based on the information collected from the patient. The Data and conclusions were drawn from the patient's questionnaire, the interview, and the physical examination. Because the information was provided in large part by the patient, it cannot be guaranteed that it has not been purposely or unconsciously manipulated. Every effort has been made to obtain as much relevant data as possible for this evaluation. It is important to note that the conclusions that lead to this procedure are derived in large part from the available data. Always take into account that the treatment will also be dependent on availability of resources and existing treatment guidelines, considered by other Pain Management  Practitioners as being common knowledge and practice, at the time of the intervention. For  Medico-Legal purposes, it is also important to point out that variation in procedural techniques and pharmacological choices are the acceptable norm. The indications, contraindications, technique, and results of the above procedure should only be interpreted and judged by a Board-Certified Interventional Pain Specialist with extensive familiarity and expertise in the same exact procedure and technique.

## 2018-11-01 NOTE — Progress Notes (Signed)
Safety precautions to be maintained throughout the outpatient stay will include: orient to surroundings, keep bed in low position, maintain call bell within reach at all times, provide assistance with transfer out of bed and ambulation.  

## 2018-11-01 NOTE — Patient Instructions (Addendum)
_________You have been sent in Diclofenac gel today to your pharmaacy.  ___________________________________________________________________________________  Post-Procedure Discharge Instructions  Instructions:  Apply ice:   Purpose: This will minimize any swelling and discomfort after procedure.   When: Day of procedure, as soon as you get home.  How: Fill a plastic sandwich bag with crushed ice. Cover it with a small towel and apply to injection site.  How long: (15 min on, 15 min off) Apply for 15 minutes then remove x 15 minutes.  Repeat sequence on day of procedure, until you go to bed.  Apply heat:   Purpose: To treat any soreness and discomfort from the procedure.  When: Starting the next day after the procedure.  How: Apply heat to procedure site starting the day following the procedure.  How long: May continue to repeat daily, until discomfort goes away.  Food intake: Start with clear liquids (like water) and advance to regular food, as tolerated.   Physical activities: Keep activities to a minimum for the first 8 hours after the procedure. After that, then as tolerated.  Driving: If you have received any sedation, be responsible and do not drive. You are not allowed to drive for 24 hours after having sedation.  Blood thinner: (Applies only to those taking blood thinners) You may restart your blood thinner 6 hours after your procedure.  Insulin: (Applies only to Diabetic patients taking insulin) As soon as you can eat, you may resume your normal dosing schedule.  Infection prevention: Keep procedure site clean and dry. Shower daily and clean area with soap and water.  Post-procedure Pain Diary: Extremely important that this be done correctly and accurately. Recorded information will be used to determine the next step in treatment. For the purpose of accuracy, follow these rules:  Evaluate only the area treated. Do not report or include pain from an untreated area. For  the purpose of this evaluation, ignore all other areas of pain, except for the treated area.  After your procedure, avoid taking a long nap and attempting to complete the pain diary after you wake up. Instead, set your alarm clock to go off every hour, on the hour, for the initial 8 hours after the procedure. Document the duration of the numbing medicine, and the relief you are getting from it.  Do not go to sleep and attempt to complete it later. It will not be accurate. If you received sedation, it is likely that you were given a medication that may cause amnesia. Because of this, completing the diary at a later time may cause the information to be inaccurate. This information is needed to plan your care.  Follow-up appointment: Keep your post-procedure follow-up evaluation appointment after the procedure (usually 2 weeks for most procedures, 6 weeks for radiofrequencies). DO NOT FORGET to bring you pain diary with you.   Expect: (What should I expect to see with my procedure?)  From numbing medicine (AKA: Local Anesthetics): Numbness or decrease in pain. You may also experience some weakness, which if present, could last for the duration of the local anesthetic.  Onset: Full effect within 15 minutes of injected.  Duration: It will depend on the type of local anesthetic used. On the average, 1 to 8 hours.   From steroids (Applies only if steroids were used): Decrease in swelling or inflammation. Once inflammation is improved, relief of the pain will follow.  Onset of benefits: Depends on the amount of swelling present. The more swelling, the longer it will take for  the benefits to be seen. In some cases, up to 10 days.  Duration: Steroids will stay in the system x 2 weeks. Duration of benefits will depend on multiple posibilities including persistent irritating factors.  Side-effects: If present, they may typically last 2 weeks (the duration of the steroids).  Frequent: Cramps (if they  occur, drink Gatorade and take over-the-counter Magnesium 450-500 mg once to twice a day); water retention with temporary weight gain; increases in blood sugar; decreased immune system response; increased appetite.  Occasional: Facial flushing (red, warm cheeks); mood swings; menstrual changes.  Uncommon: Long-term decrease or suppression of natural hormones; bone thinning. (These are more common with higher doses or more frequent use. This is why we prefer that our patients avoid having any injection therapies in other practices.)   Very Rare: Severe mood changes; psychosis; aseptic necrosis.  From procedure: Some discomfort is to be expected once the numbing medicine wears off. This should be minimal if ice and heat are applied as instructed.  Call if: (When should I call?)  You experience numbness and weakness that gets worse with time, as opposed to wearing off.  New onset bowel or bladder incontinence. (Applies only to procedures done in the spine)  Emergency Numbers:  Durning business hours (Monday - Thursday, 8:00 AM - 4:00 PM) (Friday, 9:00 AM - 12:00 Noon): [phone]  After hours: [phone]  NOTE: If you are having a problem and are unable connect with, or to talk to a provider, then go to your nearest urgent care or emergency department. If the problem is serious and urgent, please call 911. ____________________________________________________________________________________________   ____________________________________________________________________________________________  Preparing for your procedure (without sedation)  Instructions: . Oral Intake: Do not eat or drink anything for at least 3 hours prior to your procedure. . Transportation: Unless otherwise stated by your physician, you may drive yourself after the procedure. . Blood Pressure Medicine: Take your blood pressure medicine with a sip of water the morning of the procedure. . Blood thinners: Notify  our staff if you are taking any blood thinners. Depending on which one you take, there will be specific instructions on how and when to stop it. . Diabetics on insulin: Notify the staff so that you can be scheduled 1st case in the morning. If your diabetes requires high dose insulin, take only  of your normal insulin dose the morning of the procedure and notify the staff that you have done so. . Preventing infections: Shower with an antibacterial soap the morning of your procedure.  . Build-up your immune system: Take 1000 mg of Vitamin C with every meal (3 times a day) the day prior to your procedure. [name]. Antibiotics: Inform the staff if you have a condition or reason that requires you to take antibiotics before dental procedures. . Pregnancy: If you are pregnant, call and cancel the procedure. . Sickness: If you have a cold, fever, or any active infections, call and cancel the procedure. . Arrival: You must be in the facility at least 30 minutes prior to your scheduled procedure. . Children: Do not bring any children with you. . Dress appropriately: Bring dark clothing that you would not mind if they get stained. . Valuables: Do not bring any jewelry or valuables.  Procedure appointments are reserved for interventional treatments only. [name]. No Prescription Refills. . No medication changes will be discussed during procedure appointments. . No disability issues will be discussed.  Reasons to call and reschedule or cancel your procedure: (Following these  recommendations will minimize the risk of a serious complication.) . Surgeries: Avoid having procedures within 2 weeks of any surgery. (Avoid for 2 weeks before or after any surgery). . Flu Shots: Avoid having procedures within 2 weeks of a flu shots or . (Avoid for 2 weeks before or after immunizations). . Barium: Avoid having a procedure within 7-10 days after having had a radiological study involving the use of radiological contrast. (Myelograms,  Barium swallow or enema study). . Heart attacks: Avoid any elective procedures or surgeries for the initial 6 months after a "Myocardial Infarction" (Heart Attack). . Blood thinners: It is imperative that you stop these medications before procedures. Let us know if you if you take any blood thinner.  . Infection: Avoid procedures during or within two weeks of an infection (including chest colds or gastrointestinal problems). Symptoms associated with infections include: Localized redness, fever, chills, night sweats or profuse sweating, burning sensation when voiding, cough, congestion, stuffiness, runny nose, sore throat, diarrhea, nausea, vomiting, cold or Flu symptoms, recent or current infections. It is specially important if the infection is over the area that we intend to treat. [name] Heart and lung problems: Symptoms that may suggest an active cardiopulmonary problem include: cough, chest pain, breathing difficulties or shortness of breath, dizziness, ankle swelling, uncontrolled high or unusually low blood pressure, and/or palpitations. If you are experiencing any of these symptoms, cancel your procedure and contact your primary care physician for an evaluation.  Remember:  Regular Business hours are:  Monday to Thursday 8:00 AM to 4:00 PM  Provider's Schedule: Delano Metz, MD:  Procedure days: Tuesday and Thursday 7:30 AM to 4:00 PM  Edward Jolly, MD:  Procedure days: Monday and Wednesday 7:30 AM to 4:00 PM ____________________________________________________________________________________________   Post-procedure Information What to expect: Most procedures involve the use of a local anesthetic (numbing medicine), and a steroid (anti-inflammatory medicine).  The local anesthetics may cause temporary numbness and weakness of the legs or arms, depending on the location of the block. This numbness/weakness may last 4-6 hours, depending on the local anesthetic used. In rare instances,  it can last up to 24 hours. While numb, you must be very careful not to injure the extremity.  After any procedure, you could expect the pain to get better within 15-20 minutes. This relief is temporary and may last 4-6 hours. Once the local anesthetics wears off, you could experience discomfort, possibly more than usual, for up to 10 (ten) days. In the case of radiofrequencies, it may last up to 6 weeks. Surgeries may take up to 8 weeks for the healing process. The discomfort is due to the irritation caused by needles going through skin and muscle. To minimize the discomfort, we recommend using ice the first day, and heat from then on. The ice should be applied for 15 minutes on, and 15 minutes off. Keep repeating this cycle until bedtime. Avoid applying the ice directly to the skin, to prevent frostbite. Heat should be used daily, until the pain improves (4-10 days). Be careful not to burn yourself.  Occasionally you may experience muscle spasms or cramps. These occur as a consequence of the irritation caused by the needle sticks to the muscle and the blood that will inevitably be lost into the surrounding muscle tissue. Blood tends to be very irritating to tissues, which tend to react by going into spasm. These spasms may start the same day of your procedure, but they may also take days to develop. This late onset type  of spasm or cramp is usually caused by electrolyte imbalances triggered by the steroids, at the level of the kidney. Cramps and spasms tend to respond well to muscle relaxants, multivitamins (some are triggered by the procedure, but may have their origins in vitamin deficiencies), and "Gatorade", or any sports drinks that can replenish any electrolyte imbalances. (If you are a diabetic, ask your pharmacist to get you a sugar-free brand.) Warm showers or baths may also be helpful. Stretching exercises are highly recommended. General Instructions:  Be alert for signs of possible infection:  redness, swelling, heat, red streaks, elevated temperature, and/or fever. These typically appear 4 to 6 days after the procedure. Immediately notify your doctor if you experience unusual bleeding, difficulty breathing, or loss of bowel or bladder control. If you experience increased pain, do not increase your pain medicine intake, unless instructed by your pain physician. Post-Procedure Care:  Be careful in moving about. Muscle spasms in the area of the injection may occur. Applying ice or heat to the area is often helpful. The incidence of spinal headaches after epidural injections ranges between 1.4% and 6%. If you develop a headache that does not seem to respond to conservative therapy, please let your physician know. This can be treated with an epidural blood patch.   Post-procedure numbness or redness is to be expected, however it should average 4 to 6 hours. If numbness and weakness of your extremities begins to develop 4 to 6 hours after your procedure, and is felt to be progressing and worsening, immediately contact your physician.   Diet:  If you experience nausea, do not eat until this sensation goes away. If you had a "Stellate Ganglion Block" for upper extremity "Reflex Sympathetic Dystrophy", do not eat or drink until your hoarseness goes away. In any case, always start with liquids first and if you tolerate them well, then slowly progress to more solid foods. Activity:  For the first 4 to 6 hours after the procedure, use caution in moving about as you may experience numbness and/or weakness. Use caution in cooking, using household electrical appliances, and climbing steps. If you need to reach your Doctor call our office: [phone] Monday-Thursday 8:00 am - 4:00 PM    Fridays: Closed     In case of an emergency: In case of emergency, call 911 or go to the nearest emergency room and have the physician there call us.  Interpretation of Procedure Every nerve block has two components:  a diagnostic component, and a treatment component. Unrealistic expectations are the most common causes of "perceived failure".  In a perfect world, a single nerve block should be able to completely and permanently eliminate the pain. Sadly, the world is not perfect.  Most pain management nerve blocks are performed using local anesthetics and steroids. Steroids are responsible for any long-term benefit that you may experience. Their purpose is to decrease any chronic swelling that may exist in the area. Steroids begin to work immediately after being injected. However, most patients will not experience any benefits until 5 to 10 days after the injection, when the swelling has come down to the point where they can tell a difference. Steroids will only help if there is swelling to be treated. As such, they can assist with the diagnosis. If effective, they suggest an inflammatory component to the pain, and if ineffective, they rule out inflammation as the main cause or component of the problem. If the problem is one of mechanical compression, you will get no  benefit from those steroids.   In the case of local anesthetics, they have a crucial role in the diagnosis of your condition. Most will begin to work within15 to 20 minutes after injection. The duration will depend on the type used (short- vs. Long-acting). It is of outmost importance that patients keep tract of their pain, after the procedure. To assist with this matter, a "Post-procedure Pain Diary" is provided. Make sure to complete it and to bring it back to your follow-up appointment.  As long as the patient keeps accurate, detailed records of their symptoms after every procedure, and returns to have those interpreted, every procedure will provide us with invaluable information. Even a block that does not provide the patient with any relief, will always provide us with information about the mechanism and the origin of the pain. The only time a nerve block  can be considered a waste of time is when patients do not keep track of the results, or do not keep their post-procedure appointment.  Reporting the results back to your physician The Pain Score  Pain is a subjective complaint. It cannot be seen, touched, or measured. We depend entirely on the patient's report of the pain in order to assess your condition and treatment. To evaluate the pain, we use a pain scale, where "0" means "No Pain", and a "10" is "the worst possible pain that you can even imagine" (i.e. something like been eaten alive by a shark or being torn apart by a lion).   You will frequently be asked to rate your pain. Please be as accurate, remember that medical decisions will be based on your responses. Please do not rate your pain above a 10. Doing so is actually interpreted as "symptom magnification" (exaggeration), as well as lack of understanding with regards to the scale. To put this into perspective, when you tell us that your pain is at a 10 (ten), what you are saying is that there is nothing we can do to make this pain any worse. (Carefully think about that.)

## 2018-11-02 ENCOUNTER — Telehealth: Payer: Self-pay

## 2018-11-02 NOTE — Telephone Encounter (Signed)
Post procedure phone call.  Patient states she is doing ok.  

## 2018-11-20 ENCOUNTER — Other Ambulatory Visit: Payer: Self-pay

## 2018-11-20 ENCOUNTER — Encounter: Payer: Self-pay | Admitting: *Deleted

## 2018-11-20 ENCOUNTER — Emergency Department
Admission: EM | Admit: 2018-11-20 | Discharge: 2018-11-20 | Disposition: A | Discharge status: Home / Self Care | Payer: Medicare HMO | Attending: Emergency Medicine | Admitting: Emergency Medicine

## 2018-11-20 DIAGNOSIS — R110 Nausea: Secondary | ICD-10-CM | POA: Diagnosis not present

## 2018-11-20 DIAGNOSIS — R1111 Vomiting without nausea: Secondary | ICD-10-CM | POA: Diagnosis not present

## 2018-11-20 DIAGNOSIS — R197 Diarrhea, unspecified: Secondary | ICD-10-CM | POA: Diagnosis not present

## 2018-11-20 DIAGNOSIS — R55 Syncope and collapse: Secondary | ICD-10-CM | POA: Diagnosis not present

## 2018-11-20 DIAGNOSIS — R1110 Vomiting, unspecified: Secondary | ICD-10-CM | POA: Diagnosis present

## 2018-11-20 DIAGNOSIS — E039 Hypothyroidism, unspecified: Secondary | ICD-10-CM | POA: Insufficient documentation

## 2018-11-20 DIAGNOSIS — Z96651 Presence of right artificial knee joint: Secondary | ICD-10-CM | POA: Diagnosis not present

## 2018-11-20 DIAGNOSIS — R531 Weakness: Secondary | ICD-10-CM | POA: Diagnosis not present

## 2018-11-20 DIAGNOSIS — Z79899 Other long term (current) drug therapy: Secondary | ICD-10-CM | POA: Insufficient documentation

## 2018-11-20 LAB — URINALYSIS, COMPLETE (UACMP) WITH MICROSCOPIC
Bilirubin Urine: NEGATIVE
Glucose, UA: NEGATIVE mg/dL
Hgb urine dipstick: NEGATIVE
Ketones, ur: NEGATIVE mg/dL
LEUKOCYTE UA: NEGATIVE
Nitrite: NEGATIVE
PROTEIN: NEGATIVE mg/dL
Specific Gravity, Urine: 1.025 (ref 1.005–1.030)
pH: 5 (ref 5.0–8.0)

## 2018-11-20 LAB — COMPREHENSIVE METABOLIC PANEL
ALT: 15 U/L (ref 0–44)
AST: 26 U/L (ref 15–41)
Albumin: 3.8 g/dL (ref 3.5–5.0)
Alkaline Phosphatase: 42 U/L (ref 38–126)
Anion gap: 10 (ref 5–15)
BUN: 15 mg/dL (ref 8–23)
CALCIUM: 8.5 mg/dL — AB (ref 8.9–10.3)
CO2: 25 mmol/L (ref 22–32)
CREATININE: 0.74 mg/dL (ref 0.44–1.00)
Chloride: 105 mmol/L (ref 98–111)
GFR calc non Af Amer: >60 mL/min (ref >60)
Glucose, Bld: 161 mg/dL — ABNORMAL HIGH (ref 70–99)
Potassium: 4 mmol/L (ref 3.5–5.1)
Sodium: 140 mmol/L (ref 135–145)
Total Bilirubin: 0.8 mg/dL (ref 0.3–1.2)
Total Protein: 7.5 g/dL (ref 6.5–8.1)

## 2018-11-20 LAB — LIPASE, BLOOD: Lipase: 41 U/L (ref 11–51)

## 2018-11-20 LAB — CBC WITH DIFFERENTIAL/PLATELET
Abs Immature Granulocytes: 0.04 10*3/uL (ref 0.00–0.07)
Basophils Absolute: 0 10*3/uL (ref 0.0–0.1)
Basophils Relative: 0 %
Eosinophils Absolute: 0 10*3/uL (ref 0.0–0.5)
Eosinophils Relative: 0 %
HCT: 41.9 % (ref 36.0–46.0)
Hemoglobin: 13.6 g/dL (ref 12.0–15.0)
Immature Granulocytes: 0 %
Lymphocytes Relative: 6 %
Lymphs Abs: 0.6 10*3/uL — ABNORMAL LOW (ref 0.7–4.0)
MCH: 30.1 pg (ref 26.0–34.0)
MCHC: 32.5 g/dL (ref 30.0–36.0)
MCV: 92.7 fL (ref 80.0–100.0)
MONO ABS: 0.7 10*3/uL (ref 0.1–1.0)
Monocytes Relative: 7 %
Neutro Abs: 8.9 10*3/uL — ABNORMAL HIGH (ref 1.7–7.7)
Neutrophils Relative %: 87 %
Platelets: 319 10*3/uL (ref 150–400)
RBC: 4.52 MIL/uL (ref 3.87–5.11)
RDW: 13.5 % (ref 11.5–15.5)
WBC: 10.2 10*3/uL (ref 4.0–10.5)
nRBC: 0 % (ref 0.0–0.2)

## 2018-11-20 MED ORDER — ONDANSETRON HCL 4 MG/2ML IJ SOLN
4.0000 mg | Freq: Once | INTRAMUSCULAR | Status: AC
  Administered 2018-11-20: 4 mg via INTRAVENOUS

## 2018-11-20 MED ORDER — SODIUM CHLORIDE 0.9 % IV SOLN
Freq: Once | INTRAVENOUS | Status: AC
  Administered 2018-11-20: 19:00:00 via INTRAVENOUS

## 2018-11-20 MED ORDER — ONDANSETRON HCL 4 MG PO TABS: 4.0000 mg | ORAL_TABLET | Freq: Three times a day (TID) | ORAL | 0 refills | Status: DC | PRN

## 2018-11-20 MED ORDER — ONDANSETRON HCL 4 MG/2ML IJ SOLN
INTRAMUSCULAR | Status: AC
  Filled 2018-11-20: qty 2

## 2018-11-20 NOTE — ED Notes (Signed)
ED Provider at bedside. 

## 2018-11-20 NOTE — ED Notes (Signed)
Patient called out (with call light), RN to bedside; patient reports increased nausea.

## 2018-11-20 NOTE — ED Provider Notes (Signed)
[facility] Emergency Department Provider Note  ____________________________________________   I have reviewed the triage vital signs and the nursing notes.   HISTORY  Chief Complaint Emesis   History limited by: Not Limited   HPI Cynthia Jones is a 78 y.o. female who presents to the emergency department today because of concerns for nausea vomiting and diarrhea.  Patient states the symptoms have been present for the past 24 hours.  She has had multiple episodes of both.  Vomiting has been nonbloody.  She has noticed a small amount of blood when she wipes.  Patient has had some upper abdominal discomfort with the vomiting.  She denies any fevers.  Denies any known sick contacts or unusual ingestions.  States she has a history of cholecystectomy.  Was given Zofran by EMS and feels better.   Records reviewed. Per medical record review patient has a history of cholecystectomy  Past Medical History:  Diagnosis Date  . Acute anxiety 03/06/2015  . Allergy   . Anxiety   . Degenerative joint disease of knee   . Depression   . Fibromyalgia   . Obesity   . Osteopenia   . RAD (reactive airway disease)     Patient Active Problem List   Diagnosis Date Noted  . Neurogenic pain 11/01/2018  . Morbid obesity with BMI of 40.0-44.9, adult (HCC) 09/20/2018  . Hypothyroid 09/15/2018  . Vitamin D deficiency 09/05/2018  . Elevated C-reactive protein (CRP) 08/09/2018  . Elevated sed rate 08/09/2018  . Pharmacologic therapy 08/08/2018  . Disorder of skeletal system 08/08/2018  . Problems influencing health status 08/08/2018  . Contusion of foot 03/09/2018  . Advanced care planning/counseling discussion 04/12/2017  . Lumbar spondylosis 10/07/2016  . Hypercholesteremia 09/14/2016  . Long term prescription opiate use 09/08/2016  . Long term prescription benzodiazepine use 09/08/2016  . Chronic pain syndrome 09/08/2016  . Chronic low back pain (Secondary Area of  Pain) (Bilateral) (L>R) 09/08/2016  . Chronic knee pain (Primary Area of Pain) (Bilateral) (L>R) 09/08/2016  . Cervicogenic headache 09/08/2016  . Chronic hip pain (Bilateral) 09/08/2016  . Chronic hand pain (Bilateral) (R>L) 09/08/2016  . Chronic foot pain (Left) 09/08/2016  . Insomnia 09/17/2015  . Folliculitis 08/05/2015  . History of total knee replacement (Right) 04/17/2015  . Fibromyalgia 04/09/2015  . Bilateral occipital neuralgia 04/09/2015  . Greater trochanteric bursitis 04/09/2015  . Osteoarthritis of knee (Left) 04/09/2015  . Sacroiliac joint disease 04/09/2015  . Lumbar facet syndrome (Bilateral) (L>R) 04/09/2015  . Neuropathy due to secondary diabetes (HCC) 04/09/2015  . Osteopenia 03/06/2015  . Depression 03/06/2015  . Essential hypertension 03/06/2015  . Diabetes mellitus (HCC) 03/06/2015  . Osteoarthritis 03/06/2015  . Seasonal allergic rhinitis 03/06/2015  . Obesity 03/06/2015  . Intertrigo 03/06/2015    Past Surgical History:  Procedure Laterality Date  . ABDOMINAL HYSTERECTOMY    . APPENDECTOMY    . BREAST CYST ASPIRATION Right 04/18/2013   neg  . CHOLECYSTECTOMY    . EYE SURGERY    . JOINT REPLACEMENT  2014   right knee  . PAROTIDECTOMY Left   . TONSILLECTOMY      Prior to Admission medications   Medication Sig Start Date End Date Taking? Authorizing Provider  ALPRAZolam (XANAX) 0.25 MG tablet Take 1 tablet (0.25 mg total) by mouth daily as needed for anxiety. 10/20/18   [name], PA-C  atorvastatin (LIPITOR) 10 MG tablet Take 1 tablet (10 mg total) by mouth daily. 09/15/18   Crissman,  Redge Gainer, MD  calcium carbonate (CALCIUM 600) 600 MG TABS tablet Take 1 tablet (600 mg total) by mouth 2 (two) times daily with a meal. 09/05/18 11/01/18  Delano Metz, MD  carvedilol (COREG) 6.25 MG tablet Take 1 tablet (6.25 mg total) by mouth 2 (two) times daily with a meal. 06/14/18   Crissman, Redge Gainer, MD  Cholecalciferol (VITAMIN D3) 125 MCG (5000 UT)  CAPS Take 1 capsule (5,000 Units total) by mouth daily with breakfast. Take along with calcium and magnesium. 09/05/18 03/04/19  Delano Metz, MD  diclofenac sodium (VOLTAREN) 1 % GEL Apply 2 g topically 4 (four) times daily for 30 days. 11/01/18 12/01/18  Delano Metz, MD  DULoxetine (CYMBALTA) 60 MG capsule Take 1 capsule (60 mg total) by mouth daily. 06/14/18   Steele Sizer, MD  esomeprazole (NEXIUM) 40 MG capsule Take 40 mg by mouth daily at 12 noon.    [provider]  glucose blood (IGLUCOSE TEST STRIPS) test strip Use as instructed - freestyle insulin x strip 12/10/16   Crissman, Redge Gainer, MD  levothyroxine (SYNTHROID, LEVOTHROID) 50 MCG tablet Take 1 tablet (50 mcg total) by mouth daily. 09/15/18   Steele Sizer, MD  losartan (COZAAR) 100 MG tablet Take 1 tablet (100 mg total) by mouth daily. 06/14/18   Steele Sizer, MD  Magnesium 500 MG CAPS Take 1 capsule (500 mg total) by mouth 2 (two) times daily at 8 am and 10 pm. 09/05/18 03/04/19  Delano Metz, MD  nystatin (MYCOSTATIN/NYSTOP) powder APPLY TO AFFECTED AREA 4 TIMES A DAY 09/15/18   Steele Sizer, MD  oxyCODONE (OXY IR/ROXICODONE) 5 MG immediate release tablet Take 1 tablet (5 mg total) by mouth every 8 (eight) hours as needed for severe pain. 11/10/18 12/10/18  Delano Metz, MD    Allergies Nsaids and Erythromycin  Family History  Problem Relation Age of Onset  . Heart disease Mother   . Stroke Mother   . Diabetes Mother   . Alzheimer's disease Father   . Parkinson's disease Father   . Cancer Sister     Social History Social History   Tobacco Use  . Smoking status: Never Smoker  . Smokeless tobacco: Never Used  Substance Use Topics  . Alcohol use: No    Alcohol/week: 0.0 standard drinks  . Drug use: No    Review of Systems Constitutional: No fever/chills Eyes: No visual changes. ENT: No sore throat. Cardiovascular: Denies chest pain. Respiratory: Denies shortness of  breath. Gastrointestinal: Positive for upper abdominal pain, vomiting and diarrhea.  Genitourinary: Negative for dysuria. Musculoskeletal: Negative for back pain. Skin: Negative for rash. Neurological: Negative for headaches, focal weakness or numbness.  ____________________________________________   PHYSICAL EXAM:  VITAL SIGNS: ED Triage Vitals [11/20/18 1836]  Enc Vitals Group     BP 155/104     Pulse 69     Resp 16     Temp 98.4     Temp src      SpO2 100     Weight      Height      Head Circumference      Peak Flow      Pain Score 2   Constitutional: Alert and oriented.  Eyes: Conjunctivae are normal.  ENT      Head: Normocephalic and atraumatic.      Nose: No congestion/rhinnorhea.      Mouth/Throat: Mucous membranes are moist.      Neck: No stridor. Hematological/Lymphatic/Immunilogical: No cervical lymphadenopathy.  Cardiovascular: Normal rate, regular rhythm.  No murmurs, rubs, or gallops.  Respiratory: Normal respiratory effort without tachypnea nor retractions. Breath sounds are clear and equal bilaterally. No wheezes/rales/rhonchi. Gastrointestinal: Soft and non tender. No rebound. No guarding.  Genitourinary: Deferred Musculoskeletal: Normal range of motion in all extremities. No lower extremity edema. Neurologic:  Normal speech and language. No gross focal neurologic deficits are appreciated.  Skin:  Skin is warm, dry and intact. No rash noted. Psychiatric: Mood and affect are normal. Speech and behavior are normal. Patient exhibits appropriate insight and judgment.  ____________________________________________    LABS (pertinent positives/negatives)  UA hazy, rare bacteria, 11-20 squamous, 0-5 rbc and wbc Lipase 41 CMP wnl except glu 161, ca 8.5 CBC wbc 10.2, hgb 13.6, plt 319 ____________________________________________   EKG  None  ____________________________________________     RADIOLOGY  None  ____________________________________________   PROCEDURES  Procedures  ____________________________________________   INITIAL IMPRESSION / ASSESSMENT AND PLAN / ED COURSE  Pertinent labs & imaging results that were available during my care of the patient were reviewed by me and considered in my medical decision making (see chart for details).  Patient presented to the emergency department today because of concerns for nausea vomiting and diarrhea.  Symptoms have been present for roughly 24 hours.  On exam patient without any abdominal tenderness.  No active vomiting.  Had been given nausea medicine by EMS.  Blood work without any concerning leukocytosis or electrolyte abnormality.  Lipase and LFTs within normal limits.  No vomiting here in the emergency department.  She was given some IV fluids.  At this point unclear etiology.  However given lack of abdominal tenderness and lack of fever or concerning leukocytosis do not feel any emergent abdominal imaging is necessary. Will discharge with antiemetics. Discussed importance of follow up with her primary care physician.  ____________________________________________   FINAL CLINICAL IMPRESSION(S) / ED DIAGNOSES  Final diagnoses:  Nausea in adult  Diarrhea   Note: This dictation was prepared with Dragon dictation. Any transcriptional errors that result from this process are unintentional     Phineas SemenGoodman, Shikita Vaillancourt, MD 11/20/18 2228

## 2018-11-20 NOTE — Discharge Instructions (Signed)
Please seek medical attention for any high fevers, chest pain, shortness of breath, change in behavior, persistent vomiting, bloody stool or any other new or concerning symptoms.  

## 2018-11-20 NOTE — ED Triage Notes (Signed)
Pt arrives via EMS with c/o n/v/d for about 24 hours, vss, zofran 8mg  and 600 ml NS given en route. IV established.

## 2018-11-21 ENCOUNTER — Telehealth: Payer: Self-pay | Admitting: Family Medicine

## 2018-11-21 ENCOUNTER — Telehealth (INDEPENDENT_AMBULATORY_CARE_PROVIDER_SITE_OTHER): Payer: Medicare HMO | Admitting: Family Medicine

## 2018-11-21 DIAGNOSIS — R112 Nausea with vomiting, unspecified: Secondary | ICD-10-CM | POA: Diagnosis not present

## 2018-11-21 DIAGNOSIS — R197 Diarrhea, unspecified: Secondary | ICD-10-CM

## 2018-11-21 NOTE — Progress Notes (Signed)
There were no vitals taken for this visit.  Was not able to document vitals as this visit was conducted over the telephone due to COVID-19 isolation precautions.   Subjective:    Patient ID: Cynthia Jones, female    DOB: 03-Dec-1940, 78 y.o.   MRN: [id number]  HPI: Cynthia Jones is a 78 y.o. female  No chief complaint on file. Diarrhea  Pt following up today via telephone visit on ER visit last night for 24 hours of nausea, vomiting, and achy epigastric pain. Denies fevers, chills, sweats, urinary sxs, dizziness, syncope, CP, new foods, recent travel. Daughter has similar sxs right now. Given zofran and IVfluids in ER last night and discharged home. Has kept down fluids since taking zofran but still having frequent diarrhea. Had one episode of slight blood tinge with wiping yesterday but none since. Known hx of hemorrhoids.   Relevant past medical, surgical, family and social history reviewed and updated as indicated. Interim medical history since our last visit reviewed. Allergies and medications reviewed and updated.  Review of Systems  Per HPI unless specifically indicated above     Objective:    There were no vitals taken for this visit.  Wt Readings from Last 3 Encounters:  11/01/18 210 lb (95.3 kg)  10/20/18 210 lb (95.3 kg)  10/18/18 215 lb (97.5 kg)    Physical Exam  Unable to perform physical exam due to being a telephone visit   Results for orders placed or performed during the hospital encounter of 11/20/18  Urinalysis, Complete w Microscopic  Result Value Ref Range   Color, Urine YELLOW (A) YELLOW   APPearance HAZY (A) CLEAR   Specific Gravity, Urine 1.025 1.005 - 1.030   pH 5.0 5.0 - 8.0   Glucose, UA NEGATIVE NEGATIVE mg/dL   Hgb urine dipstick NEGATIVE NEGATIVE   Bilirubin Urine NEGATIVE NEGATIVE   Ketones, ur NEGATIVE NEGATIVE mg/dL   Protein, ur NEGATIVE NEGATIVE mg/dL   Nitrite NEGATIVE NEGATIVE   Leukocytes,Ua NEGATIVE NEGATIVE   RBC /  HPF 0-5 0 - 5 RBC/hpf   WBC, UA 0-5 0 - 5 WBC/hpf   Bacteria, UA RARE (A) NONE SEEN   Squamous Epithelial / LPF 11-20 0 - 5   Mucus PRESENT   CBC with Differential  Result Value Ref Range   WBC 10.2 4.0 - 10.5 K/uL   RBC 4.52 3.87 - 5.11 MIL/uL   Hemoglobin 13.6 12.0 - 15.0 g/dL   HCT 11.0 31.5 - 94.5 %   MCV 92.7 80.0 - 100.0 fL   MCH 30.1 26.0 - 34.0 pg   MCHC 32.5 30.0 - 36.0 g/dL   RDW 85.9 29.2 - 44.6 %   Platelets 319 150 - 400 K/uL   nRBC 0.0 0.0 - 0.2 %   Neutrophils Relative % 87 %   Neutro Abs 8.9 (H) 1.7 - 7.7 K/uL   Lymphocytes Relative 6 %   Lymphs Abs 0.6 (L) 0.7 - 4.0 K/uL   Monocytes Relative 7 %   Monocytes Absolute 0.7 0.1 - 1.0 K/uL   Eosinophils Relative 0 %   Eosinophils Absolute 0.0 0.0 - 0.5 K/uL   Basophils Relative 0 %   Basophils Absolute 0.0 0.0 - 0.1 K/uL   Immature Granulocytes 0 %   Abs Immature Granulocytes 0.04 0.00 - 0.07 K/uL  Comprehensive metabolic panel  Result Value Ref Range   Sodium 140 135 - 145 mmol/L   Potassium 4.0 3.5 - 5.1 mmol/L   Chloride  105 98 - 111 mmol/L   CO2 25 22 - 32 mmol/L   Glucose, Bld 161 (H) 70 - 99 mg/dL   BUN 15 8 - 23 mg/dL   Creatinine, Ser 2.95 0.44 - 1.00 mg/dL   Calcium 8.5 (L) 8.9 - 10.3 mg/dL   Total Protein 7.5 6.5 - 8.1 g/dL   Albumin 3.8 3.5 - 5.0 g/dL   AST 26 15 - 41 U/L   ALT 15 0 - 44 U/L   Alkaline Phosphatase 42 38 - 126 U/L   Total Bilirubin 0.8 0.3 - 1.2 mg/dL   GFR calc non Af Amer >60 >60 mL/min   GFR calc Af Amer >60 >60 mL/min   Anion gap 10 5 - 15  Lipase, blood  Result Value Ref Range   Lipase 41 11 - 51 U/L      Assessment & Plan:   Problem List Items Addressed This Visit    None    N/V/D - mildly improved since ER visit last night. Notes, labs personally reviewed. Tolerating PO with use of zofran. Push fluids, rest, strict return precautions reviewed with pt.   Virtual Visit via Telephone Note  I connected with Cynthia Jones on 11/21/18 at  2:30 PM EDT by  telephone and verified that I am speaking with the correct person using two identifiers.   I discussed the limitations, risks, security and privacy concerns of performing an evaluation and management service by telephone and the availability of in person appointments. I also discussed with the patient that there may be a patient responsible charge related to this service. The patient expressed understanding and agreed to proceed.   I discussed the assessment and treatment plan with the patient. The patient was provided an opportunity to ask questions and all were answered. The patient agreed with the plan and demonstrated an understanding of the instructions.   The patient was advised to call back or seek an in-person evaluation if the symptoms worsen or if the condition fails to improve as anticipated.  I provided 15 minutes of non-face-to-face time during this encounter.   [name], PA-C    Follow up plan: No follow-ups on file.

## 2018-11-21 NOTE — Progress Notes (Signed)
Called and spoke with patient. She was seen at E.R yesterday. She is still having vomiting. She has not been taking vitamins since the last week due to illness.

## 2018-11-21 NOTE — Telephone Encounter (Signed)
Copied from CRM [id number]. Topic: Appointment Scheduling - Scheduling Inquiry for Clinic >> Nov 21, 2018 11:25 AM Maia Petties wrote: Reason for CRM: Pt was at ER 11/20/18 and advised to f/u with PCP in 1 day for vomiting/diarrhea. Pt states vomiting has stopped but diarrhea has not. Pt does not think she can come to the office. Pt requesting f/u from [name] or [name] via phone. Please advise.

## 2018-11-21 NOTE — Telephone Encounter (Signed)
Please get her set up for a telephone visit for ER f/u

## 2018-11-22 ENCOUNTER — Telehealth: Payer: Self-pay | Admitting: *Deleted

## 2018-11-22 NOTE — Telephone Encounter (Signed)
Did you set her up a virtual visit?

## 2018-11-22 NOTE — Telephone Encounter (Signed)
Needs Oxycodone. Has plenty of Voltaren gel, does not need.

## 2018-11-22 NOTE — Telephone Encounter (Signed)
Called pt, still having frequent diarrhea after taking an imodium and having some soreness in anal area from frequent stools. No fevers, vomiting, worsening belly pain, urinary sxs. Still taking zofran prn with good relief. Mild weakness, no dizziness. Discussed continuing to try imodium, push fluids (gatorade, plain water), BRAT diet as tolerated. Call with worsening or persistent sxs and return precautions to ER given  Copied from CRM [id number]. Topic: General - Other >> Nov 22, 2018 10:18 AM Jaquita Rector A wrote: Reason for CRM: Patient called back to say that she spoke with [name] on 11/21/2018 and was told to call back if she is not feeling any better. She is asking for a call back to see what the next step will be say she is feeling worst than yesterday. Ph# [phone]

## 2018-11-23 ENCOUNTER — Telehealth: Payer: Self-pay | Admitting: Family Medicine

## 2018-11-23 ENCOUNTER — Ambulatory Visit: Payer: Medicare HMO | Attending: Nurse Practitioner | Admitting: Nurse Practitioner

## 2018-11-23 ENCOUNTER — Other Ambulatory Visit: Payer: Self-pay | Admitting: Nurse Practitioner

## 2018-11-23 ENCOUNTER — Other Ambulatory Visit: Payer: Self-pay

## 2018-11-23 DIAGNOSIS — M47816 Spondylosis without myelopathy or radiculopathy, lumbar region: Secondary | ICD-10-CM

## 2018-11-23 DIAGNOSIS — M25561 Pain in right knee: Secondary | ICD-10-CM | POA: Diagnosis not present

## 2018-11-23 DIAGNOSIS — M25562 Pain in left knee: Principal | ICD-10-CM

## 2018-11-23 DIAGNOSIS — G894 Chronic pain syndrome: Secondary | ICD-10-CM

## 2018-11-23 DIAGNOSIS — M1712 Unilateral primary osteoarthritis, left knee: Secondary | ICD-10-CM

## 2018-11-23 DIAGNOSIS — M79643 Pain in unspecified hand: Principal | ICD-10-CM

## 2018-11-23 DIAGNOSIS — M797 Fibromyalgia: Secondary | ICD-10-CM

## 2018-11-23 DIAGNOSIS — G8929 Other chronic pain: Secondary | ICD-10-CM

## 2018-11-23 MED ORDER — OXYCODONE HCL 5 MG PO TABS: 5.0000 mg | ORAL_TABLET | Freq: Three times a day (TID) | ORAL | 0 refills | Status: DC | PRN

## 2018-11-23 MED ORDER — CIPROFLOXACIN HCL 250 MG PO TABS: 250.0000 mg | ORAL_TABLET | Freq: Two times a day (BID) | ORAL | 0 refills | Status: DC

## 2018-11-23 NOTE — Progress Notes (Signed)
Virtual Visit via Telephone Note  I connected with Cynthia Jones on 11/23/18 at  by telephone and verified that I am speaking with the correct person using two identifiers.   I discussed the limitations, risks, security and privacy concerns of performing an evaluation and management service by telephone and the availability of in person appointments. I also discussed with the patient that there may be a patient responsible charge related to this service. The patient expressed understanding and agreed to proceed.   History of Present Illness: She has been to the ER and was diagnosed with stomach virus. She is treated here for hand shoulder back and knee pain.  Recently completed a series of 5 Hyalgan injections to her left knee.  She feels like this has been effective for her left knee pain.  She is currently rating her pain about a 2 out of 10.  She states that this is down from a 7 out of 10.  She feels like she may have had some increase in range of motion.  But mostly she has significant pain relief.  Continue to have bilateral hand pain and increased shoulder pain.  She admits that the shoulder pain is going across her back.  She feels like this is getting worse. She denies any side effects of her current regimen.  Stresses no additional concerns today.  Observations/Objective: Patient seems to be having that pain control with both interventional therapy and oral narcotic medications.    Assessment and Plan:  Controlled Osteoarthritis left knee Controlled chronic bilateral hand pain Persistent chronic fibromyalgia Controlled chronic lumbar spondylosis.  Oxycodone 5 mg #90 1 tablet every 8 hours as needed ( total of 3 prescriptions sent to pharmacy).  Diclofenac sodium (Voltaren) 1%gel not needed at this time.  Calcium and vitamin D are continued however patient is receiving this from her [employer] via mail ord   Follow Up Instructions: We will make her a follow-up  appointment in 3 months for reevaluation and medication management.    I discussed the assessment and tretment plan with the patient. The patient was provided an opportunity to ask questions and all were answered. The patient agreed with the plan and demonstrated an understanding of the instructions.   The patient was advised to call back or seek an in-person evaluation if the symptoms worsen or if the condition fails to improve as anticipated.  I provided 9.38 minutes of non-face-to-face time during this encounter.   Thad Ranger, NP

## 2018-11-23 NOTE — Progress Notes (Signed)
Virtual Visit via Telephone Note  I connected with Cynthia Jones on 11/23/18 at  by telephone and verified that I am speaking with the correct person using two identifiers.   I discussed the limitations, risks, security and privacy concerns of performing an evaluation and management service by telephone and the availability of in person appointments. I also discussed with the patient that there may be a patient responsible charge related to this service. The patient expressed understanding and agreed to proceed.   History of Present Illness: She has been to the ER and was diagnosed with stomach virus. She is treated here for hand shoulder back and knee pain.  Recently completed a series of 5 Hyalgan injections to her left knee.  She feels like this has been effective for her left knee pain.  She is currently rating her pain about a 2 out of 10.  She states that this is down from a 7 out of 10.  She feels like she may have had some increase in range of motion.  But mostly she has significant pain relief.  Continue to have bilateral hand pain and increased shoulder pain.  She admits that the shoulder pain is going across her back.  She feels like this is getting worse. She denies any side effects of her current regimen.  Stresses no additional concerns today.  Observations/Objective: Patient seems to be having that pain control with both interventional therapy and oral narcotic medications.    Assessment and Plan:  Controlled Osteoarthritis left knee Controlled chronic bilateral hand pain Persistent chronic fibromyalgia Controlled chronic lumbar spondylosis.  Oxycodone 5 mg #90 1 tablet every 8 hours as needed ( total of 3 prescriptions sent to pharmacy).  Diclofenac sodium (Voltaren) 1%gel not needed at this time.  Calcium and vitamin D are continued however patient is receiving this from her [employer] company via mail ord   Follow Up Instructions: We will make her a follow-up  appointment in 3 months for reevaluation and medication management.    I discussed the assessment and tretment plan with the patient. The patient was provided an opportunity to ask questions and all were answered. The patient agreed with the plan and demonstrated an understanding of the instructions.   The patient was advised to call back or seek an in-person evaluation if the symptoms worsen or if the condition fails to improve as anticipated.  I provided 9.38 minutes of non-face-to-face time during this encounter.   Perris Tripathi, NP   

## 2018-11-23 NOTE — Telephone Encounter (Signed)
Patient states she's worse than yesterday, can't even drink a small amount of fluids without having explosive diarrhea. Feeling weaker, but still no fevers or abdominal pain. Will send over abx but advised her to go to UC if not improving over the course of the next 24 hours or at any point if she's worsening. Pt will have her son take her if she's not improving. Probiotics BID, BRAT diet reviewed  Copied from CRM [id number]. Topic: General - Other >> Nov 23, 2018  8:49 AM Burchel, Abbi R wrote: Reason for CRM:   Pt requesting a call from Roosvelt Maser to f/up from yesterday's call.  Please call pt.

## 2018-11-24 ENCOUNTER — Encounter: Payer: Medicare HMO | Admitting: Nurse Practitioner

## 2018-11-25 ENCOUNTER — Telehealth: Payer: Self-pay | Admitting: Family Medicine

## 2018-11-25 NOTE — Telephone Encounter (Signed)
Called pt, she is feeling much better still just weak and fatigued and some evening diarrhea. No fevers, belly pain is better, and tolerating PO well. Encouraged continued good fluid intake, advance diet as tolerated. Strict return precautions reiterated. Pt to call with further questions or concerns  Copied from CRM [id number]. Topic: General - Other >> Nov 25, 2018  2:43 PM Marylen Ponto wrote: Reason for CRM: Pt requests to speak with [name]. Pt did not want to provide details and requested that [name] returns her call.

## 2018-11-29 ENCOUNTER — Other Ambulatory Visit: Payer: Self-pay | Admitting: Family Medicine

## 2018-11-29 DIAGNOSIS — F419 Anxiety disorder, unspecified: Secondary | ICD-10-CM

## 2018-11-29 NOTE — Telephone Encounter (Signed)
Requested medication (s) are due for refill today: yes  Requested medication (s) are on the active medication list: yes   Last refill: 10/20/2018  #30  0 refills  Future visit scheduled yes  12/19/2018   [name]  Notes to clinic:not delegated  Requested Prescriptions  Pending Prescriptions Disp Refills   ALPRAZolam (XANAX) 0.25 MG tablet [Pharmacy Med Name: ALPRAZOLAM 0.25 MG TABLET] 30 tablet 0    Sig: TAKE 1 TABLET BY MOUTH DAILY AS NEEDED FOR ANXIETY     Not Delegated - Psychiatry:  Anxiolytics/Hypnotics Failed - 11/29/2018  5:03 PM      Failed - This refill cannot be delegated      Failed - Urine Drug Screen completed in last 360 days.      Passed - Valid encounter within last 6 months    Recent Outpatient Visits          1 month ago Anxiety   [facility] Roosvelt Maser [place], [place]   2 months ago Hypothyroidism, unspecified type   [facility] Crissman, Redge Gainer, [place]   5 months ago Diabetes mellitus without complication [facility])   [facility] Crissman, Redge Gainer, MD   8 months ago Contusion of left foot, initial encounter   [facility] Crissman, Redge Gainer, MD   1 year ago Axillary mass, right   [facility], [place], [place]      Future Appointments            In 2 weeks Crissman, Redge Gainer, MD [facility]   In 4 months  [facility], [facility]

## 2018-12-13 ENCOUNTER — Telehealth: Payer: Self-pay | Admitting: Family Medicine

## 2018-12-13 NOTE — Telephone Encounter (Signed)
Appt scheduled for tomorrow for telephone visit.

## 2018-12-13 NOTE — Telephone Encounter (Signed)
Copied from CRM [id number]. Topic: Quick Communication - See Telephone Encounter >> Dec 13, 2018  1:11 PM Angela Nevin wrote: CRM for notification. See Telephone encounter for: 12/13/18.   Patient called with complaints of "slight fever" and sinus congestion. Patient declined when asked if she would like to speak with triage nurse. Patient states that she would just like to speak with [name], if at all possible. Please advise.

## 2018-12-13 NOTE — Telephone Encounter (Signed)
OK for virtual/telephone visit

## 2018-12-14 ENCOUNTER — Other Ambulatory Visit: Payer: Self-pay

## 2018-12-14 ENCOUNTER — Encounter: Payer: Self-pay | Admitting: Family Medicine

## 2018-12-14 ENCOUNTER — Ambulatory Visit (INDEPENDENT_AMBULATORY_CARE_PROVIDER_SITE_OTHER): Payer: Medicare HMO | Admitting: Family Medicine

## 2018-12-14 VITALS — Temp 99.5°F | Ht 60.0 in | Wt 210.0 lb

## 2018-12-14 DIAGNOSIS — J040 Acute laryngitis: Secondary | ICD-10-CM

## 2018-12-14 DIAGNOSIS — J0100 Acute maxillary sinusitis, unspecified: Secondary | ICD-10-CM

## 2018-12-14 MED ORDER — PREDNISONE 50 MG PO TABS: 50.0000 mg | ORAL_TABLET | Freq: Every day | ORAL | 0 refills | Status: DC

## 2018-12-14 MED ORDER — AMOXICILLIN-POT CLAVULANATE 875-125 MG PO TABS: 1.0000 | ORAL_TABLET | Freq: Two times a day (BID) | ORAL | 0 refills | Status: DC

## 2018-12-14 NOTE — Progress Notes (Signed)
Temp 99.5 F (37.5 C) (Oral)   Ht 5' (1.524 m)   Wt 210 lb (95.3 kg)   BMI 41.01 kg/m    Subjective:    Patient ID: Cynthia Jones Bendix, female    DOB: 11/05/1940, 78 y.o.   MRN: [id number]  HPI: Cynthia Jones Reicher is a 78 y.o. female  Chief Complaint  Patient presents with  . Hoarse    x 1 week.   . Nasal Congestion  . Sore Throat   UPPER RESPIRATORY TRACT INFECTION Duration: 2 weeks Worst symptom: hoarse voice Fever: yes- subjective, low grade 99-100 Cough: yes Shortness of breath: no Wheezing: no Chest pain: no Chest tightness: no Chest congestion: no Nasal congestion: yes Runny nose: yes Post nasal drip: yes Sneezing: no Sore throat: yes Swollen glands: no Sinus pressure: yes Headache: yes Face pain: yes Toothache: no Ear pain: no  Ear pressure: yes left Eyes red/itching:no Eye drainage/crusting: no  Vomiting: no Rash: no Fatigue: yes Sick contacts: yes Strep contacts: no  Context: worse Recurrent sinusitis: no Relief with OTC cold/cough medications: no  Treatments attempted: anti-histamine and pseudoephedrine  Relevant past medical, surgical, family and social history reviewed and updated as indicated. Interim medical history since our last visit reviewed. Allergies and medications reviewed and updated.  Review of Systems  Constitutional: Positive for chills, diaphoresis and fever. Negative for activity change, appetite change, fatigue and unexpected weight change.  HENT: Positive for congestion, postnasal drip, rhinorrhea, sinus pressure, sinus pain and sore throat. Negative for dental problem, drooling, ear discharge, ear pain, facial swelling, hearing loss, mouth sores, nosebleeds, sneezing, tinnitus, trouble swallowing and voice change.   Eyes: Negative.   Respiratory: Positive for cough. Negative for apnea, choking, chest tightness, shortness of breath, wheezing and stridor.   Cardiovascular: Negative.   Gastrointestinal: Negative.    Psychiatric/Behavioral: Negative.     Per HPI unless specifically indicated above     Objective:    Temp 99.5 F (37.5 C) (Oral)   Ht 5' (1.524 m)   Wt 210 lb (95.3 kg)   BMI 41.01 kg/m   Wt Readings from Last 3 Encounters:  12/14/18 210 lb (95.3 kg)  11/01/18 210 lb (95.3 kg)  10/20/18 210 lb (95.3 kg)    Physical Exam Vitals signs and nursing note reviewed.  Pulmonary:     Effort: Pulmonary effort is normal. No respiratory distress.     Comments: Speaking in full sentences, very hoarse voice Neurological:     Mental Status: She is alert.  Psychiatric:        Mood and Affect: Mood normal.        Behavior: Behavior normal.        Thought Content: Thought content normal.        Judgment: Judgment normal.     Results for orders placed or performed during the hospital encounter of 11/20/18  Urinalysis, Complete w Microscopic  Result Value Ref Range   Color, Urine YELLOW (A) YELLOW   APPearance HAZY (A) CLEAR   Specific Gravity, Urine 1.025 1.005 - 1.030   pH 5.0 5.0 - 8.0   Glucose, UA NEGATIVE NEGATIVE mg/dL   Hgb urine dipstick NEGATIVE NEGATIVE   Bilirubin Urine NEGATIVE NEGATIVE   Ketones, ur NEGATIVE NEGATIVE mg/dL   Protein, ur NEGATIVE NEGATIVE mg/dL   Nitrite NEGATIVE NEGATIVE   Leukocytes,Ua NEGATIVE NEGATIVE   RBC / HPF 0-5 0 - 5 RBC/hpf   WBC, UA 0-5 0 - 5 WBC/hpf   Bacteria, UA RARE (  A) NONE SEEN   Squamous Epithelial / LPF 11-20 0 - 5   Mucus PRESENT   CBC with Differential  Result Value Ref Range   WBC 10.2 4.0 - 10.5 K/uL   RBC 4.52 3.87 - 5.11 MIL/uL   Hemoglobin 13.6 12.0 - 15.0 Jones/dL   HCT 43.3 29.5 - 18.8 %   MCV 92.7 80.0 - 100.0 fL   MCH 30.1 26.0 - 34.0 pg   MCHC 32.5 30.0 - 36.0 Jones/dL   RDW 41.6 60.6 - 30.1 %   Platelets 319 150 - 400 K/uL   nRBC 0.0 0.0 - 0.2 %   Neutrophils Relative % 87 %   Neutro Abs 8.9 (H) 1.7 - 7.7 K/uL   Lymphocytes Relative 6 %   Lymphs Abs 0.6 (L) 0.7 - 4.0 K/uL   Monocytes Relative 7 %   Monocytes  Absolute 0.7 0.1 - 1.0 K/uL   Eosinophils Relative 0 %   Eosinophils Absolute 0.0 0.0 - 0.5 K/uL   Basophils Relative 0 %   Basophils Absolute 0.0 0.0 - 0.1 K/uL   Immature Granulocytes 0 %   Abs Immature Granulocytes 0.04 0.00 - 0.07 K/uL  Comprehensive metabolic panel  Result Value Ref Range   Sodium 140 135 - 145 mmol/L   Potassium 4.0 3.5 - 5.1 mmol/L   Chloride 105 98 - 111 mmol/L   CO2 25 22 - 32 mmol/L   Glucose, Bld 161 (H) 70 - 99 mg/dL   BUN 15 8 - 23 mg/dL   Creatinine, Ser 6.01 0.44 - 1.00 mg/dL   Calcium 8.5 (L) 8.9 - 10.3 mg/dL   Total Protein 7.5 6.5 - 8.1 Jones/dL   Albumin 3.8 3.5 - 5.0 Jones/dL   AST 26 15 - 41 U/L   ALT 15 0 - 44 U/L   Alkaline Phosphatase 42 38 - 126 U/L   Total Bilirubin 0.8 0.3 - 1.2 mg/dL   GFR calc non Af Amer >60 >60 mL/min   GFR calc Af Amer >60 >60 mL/min   Anion gap 10 5 - 15  Lipase, blood  Result Value Ref Range   Lipase 41 11 - 51 U/L      Assessment & Plan:   Problem List Items Addressed This Visit    None    Visit Diagnoses    Acute non-recurrent maxillary sinusitis    -  Primary   Will treat with augmentin and burst of prednisone. Call with any concerns or if not getting better. Conitnue to monitor.    Relevant Medications   amoxicillin-clavulanate (AUGMENTIN) 875-125 MG tablet   predniSONE (DELTASONE) 50 MG tablet   Laryngitis       Will treat with augmentin and burst of prednisone. Call with any concerns or if not getting better. Conitnue to monitor.        Follow up plan: Return if symptoms worsen or fail to improve.    . This visit was completed via telephone due to the restrictions of the COVID-19 pandemic. All issues as above were discussed and addressed but no physical exam was performed. If it was felt that the patient should be evaluated in the office, they were directed there. The patient verbally consented to this visit. Patient was unable to complete an audio/visual visit due to Lack of equipment. Due to the  catastrophic nature of the COVID-19 pandemic, this visit was done through audio contact only. . Location of the patient: home . Location of the provider: home . Those  involved with this call:  . Provider: Olevia Perches, DO . CMA: Sheilah Mins, CMA . Front Desk/Registration: Adela Ports  . Time spent on call: 15 minutes on the phone discussing health concerns. 23 minutes total spent in review of patient's record and preparation of their chart.

## 2018-12-19 ENCOUNTER — Ambulatory Visit: Payer: Medicare Other | Admitting: Family Medicine

## 2018-12-22 NOTE — Telephone Encounter (Unsigned)
Copied from CRM [id number]. Topic: General - Inquiry >> Dec 22, 2018  1:37 PM Maia Petties wrote: Reason for CRM: pt called stating she has questions on carvedilol (COREG) 6.25 MG tablet. She said she needs to speak to "Loraine Leriche". She is asking to talk to him for a few minutes. She said she missed an appt. Offered to get her transferred to schedule and she said she just needs to talk to him and to have him call her.

## 2018-12-27 ENCOUNTER — Ambulatory Visit (INDEPENDENT_AMBULATORY_CARE_PROVIDER_SITE_OTHER): Payer: Medicare HMO | Admitting: Family Medicine

## 2018-12-27 ENCOUNTER — Other Ambulatory Visit: Payer: Self-pay

## 2018-12-27 DIAGNOSIS — J019 Acute sinusitis, unspecified: Secondary | ICD-10-CM

## 2018-12-27 NOTE — Progress Notes (Signed)
   There were no vitals taken for this visit.   Subjective:    Patient ID: Cynthia Jones, female    DOB: 25-Sep-1940, 78 y.o.   MRN: [id number]  HPI: Cynthia Jones is a 78 y.o. female  No office or phone visit   Follow up plan: No follow-ups on file.

## 2018-12-31 ENCOUNTER — Other Ambulatory Visit: Payer: Self-pay | Admitting: Family Medicine

## 2018-12-31 DIAGNOSIS — E118 Type 2 diabetes mellitus with unspecified complications: Secondary | ICD-10-CM

## 2019-01-10 ENCOUNTER — Other Ambulatory Visit: Payer: Self-pay | Admitting: Family Medicine

## 2019-01-10 MED ORDER — ARIPIPRAZOLE 2 MG PO TABS: 2.0000 mg | ORAL_TABLET | Freq: Every day | ORAL | 1 refills | Status: DC

## 2019-01-10 NOTE — Progress Notes (Signed)
Phone call Discussed with patient will need to take an extra Xanax discussed with the pain medicine she is taking it is not safe.  Patient having worsening depression and nerves with frequent crying will add Abilify 2 mg and to follow office visit 1 week

## 2019-01-16 ENCOUNTER — Encounter: Payer: Self-pay | Admitting: Family Medicine

## 2019-02-02 ENCOUNTER — Other Ambulatory Visit: Payer: Self-pay | Admitting: Family Medicine

## 2019-02-09 ENCOUNTER — Telehealth: Payer: Self-pay | Admitting: Family Medicine

## 2019-02-09 NOTE — Telephone Encounter (Signed)
Phone call Discussed with patient having side effects from Lipitor will stop and observe response.

## 2019-02-16 ENCOUNTER — Telehealth: Payer: Self-pay | Admitting: *Deleted

## 2019-02-16 ENCOUNTER — Encounter: Payer: Self-pay | Admitting: Pain Medicine

## 2019-02-16 NOTE — Telephone Encounter (Signed)
Attempted to call for pre appointment assessment. No answer, unable to leave a message. 

## 2019-02-20 ENCOUNTER — Ambulatory Visit: Payer: Medicare HMO | Attending: Nurse Practitioner | Admitting: Pain Medicine

## 2019-02-20 ENCOUNTER — Other Ambulatory Visit: Payer: Self-pay

## 2019-02-20 ENCOUNTER — Encounter: Payer: Medicare HMO | Admitting: Nurse Practitioner

## 2019-02-20 DIAGNOSIS — G894 Chronic pain syndrome: Secondary | ICD-10-CM | POA: Diagnosis not present

## 2019-02-20 DIAGNOSIS — M545 Low back pain: Secondary | ICD-10-CM

## 2019-02-20 DIAGNOSIS — M1712 Unilateral primary osteoarthritis, left knee: Secondary | ICD-10-CM

## 2019-02-20 DIAGNOSIS — M25561 Pain in right knee: Secondary | ICD-10-CM | POA: Diagnosis not present

## 2019-02-20 DIAGNOSIS — G8929 Other chronic pain: Secondary | ICD-10-CM | POA: Diagnosis not present

## 2019-02-20 DIAGNOSIS — M25562 Pain in left knee: Secondary | ICD-10-CM | POA: Diagnosis not present

## 2019-02-20 MED ORDER — OXYCODONE HCL 5 MG PO TABS: 5.0000 mg | ORAL_TABLET | Freq: Three times a day (TID) | ORAL | 0 refills | Status: DC | PRN

## 2019-02-20 MED ORDER — DICLOFENAC SODIUM 1 % TD GEL: 2.0000 g | Freq: Four times a day (QID) | TRANSDERMAL | 7 refills | Status: DC

## 2019-02-20 NOTE — Progress Notes (Signed)
Pain Management Virtual Encounter Note - Virtual Visit via Telephone Telehealth (real-time audio visits between healthcare provider and patient).   Patient's Phone No. & Preferred Pharmacy:  [phone] (home); There is no such number on file (mobile).; (Preferred) [phone] No e-mail address on record  [id number] [place], [place] - [address] [place] [address] Phone: [phone] Fax: [phone]    Pre-screening note:  Our staff contacted Cynthia Jones and offered her an "in person", "face-to-face" appointment versus a telephone encounter. She indicated preferring the telephone encounter, at this time.   Reason for Virtual Visit: COVID-19*  Social distancing based on CDC and AMA recommendations.   I contacted Cynthia Jones on 02/20/2019 via telephone.      I clearly identified myself as Gaspar Cola, MD. I verified that I was speaking with the correct person using two identifiers (Name: Cynthia Jones, and date of birth: March 04, 1941).  Advanced Informed Consent I sought verbal advanced consent from Cynthia Jones for virtual visit interactions. I informed Cynthia Jones of possible security and privacy concerns, risks, and limitations associated with providing "not-in-person" medical evaluation and management services. I also informed Cynthia Jones of the availability of "in-person" appointments. Finally, I informed her that there would be a charge for the virtual visit and that she could be  personally, fully or partially, financially responsible for it. Cynthia Jones expressed understanding and agreed to proceed.   Historic Elements   Cynthia Jones is a 78 y.o. year old, female patient evaluated today after her last encounter by our practice on 02/16/2019. Cynthia Jones  has a past medical history of Acute anxiety (03/06/2015), Allergy, Anxiety, Degenerative joint disease of knee, Depression, Fibromyalgia, Obesity, Osteopenia,  and RAD (reactive airway disease). She also  has a past surgical history that includes Parotidectomy (Left); Appendectomy; Cholecystectomy; Abdominal hysterectomy; Tonsillectomy; Joint replacement (2014); Eye surgery; and Breast cyst aspiration (Right, 04/18/2013). Cynthia Jones has a current medication list which includes the following prescription(s): alprazolam, aripiprazole, carvedilol, vitamin d3, duloxetine, esomeprazole, glucose blood, levothyroxine, losartan, nystatin, oxycodone, oxycodone, oxycodone, calcium carbonate, and diclofenac sodium. She  reports that she has never smoked. She has never used smokeless tobacco. She reports that she does not drink alcohol or use drugs. Cynthia Jones is allergic to nsaids and erythromycin.   HPI  Today, she is being contacted for medication management.  It has recently come to our attention that the patient was started on benzodiazepines by Dr. Rexford Maus.  A letter has been sent to Dr. Rexford Maus to make sure that we coordinate the care of this patient.  Taking into consideration the patient's age and the medications that she is receiving, I would prefer that she is not on a benzodiazepine unless it has been approved and prescribed by a psychiatrist.  The patient indicates not having access to "My Chart" and because of this it has been difficult for me to provide her with written information during this pandemic.  However, I have taken the time to inform the patient several times about the risks of the opioids including the possibility of drug to drug interactions that may lead to respiratory depression and death.  Every time I tell the patient, she will act surprised as if I was telling her for the first time, which makes me think that perhaps she is having difficulty remembering and keeping track of this information.  I have again reminded her about this today.  She  indicates still having some problems with her low back pain and bilateral  knee pain but she also indicates having pain going all the way down into a her left foot.  When the pain gets into the foot it usually is over the top and outside portion of the foot affecting the little toe.  I have offered her to come in and based on the description of the symptoms, I have offered her a lumbar epidural steroid injection at the L4-5 level on the left side.  She also indicates having a lot of low back pain when she stands for a prolonged.  Time, and based on my initial evaluations of the patient, this is associated with some degenerative disc disease as well as lumbar facet arthropathy.  In any case the patient voiced that there has been some issues with her family's health and she would like to hold on doing any kind of injections until a later time when she feels that things are under better control.  Pharmacotherapy Assessment  Analgesic: Oxycodone IR 5 mg 1 tablet by mouth every 8 hours. MME/day:15mg /day.   Monitoring: Pharmacotherapy: No side-effects or adverse reactions reported. [place] PMP: PDMP reviewed during this encounter.       Compliance: No problems identified. Effectiveness: Clinically acceptable. Plan: Refer to "POC".  Post-Procedure Evaluation  Procedure: Therapeutic left-sided intra-articular Hyalgan knee injection #5, no fluoroscopy or IV sedation Pre-procedure pain level:  5/10 Post-procedure: 0/10 (100% relief)  Sedation: Please see nurses note.  Effectiveness during initial hour after procedure(Ultra-Short Term Relief):   100%  Local anesthetic used: Long-acting (4-6 hours) Effectiveness: Defined as any analgesic benefit obtained secondary to the administration of local anesthetics. This carries significant diagnostic value as to the etiological location, or anatomical origin, of the pain. Duration of benefit is expected to coincide with the duration of the local anesthetic used.  Effectiveness during initial 4-6 hours after procedure(Short-Term Relief):    100%  Long-term benefit: Defined as any relief past the pharmacologic duration of the local anesthetics.  Effectiveness past the initial 6 hours after procedure(Long-Term Relief):   0%  Current benefits: Defined as benefit that persist at this time.   Analgesia:  No benefit Function: No benefit ROM: No benefit  Pertinent Labs   SAFETY SCREENING Profile No results found for: SARSCOV2NAA, COVIDSOURCE, STAPHAUREUS, MRSAPCR, HCVAB, HIV, PREGTESTUR Renal Function Lab Results  Component Value Date   BUN 15 11/20/2018   CREATININE 0.74 11/20/2018   BCR 18 06/14/2018   GFRAA >60 11/20/2018   GFRNONAA >60 11/20/2018   Hepatic Function Lab Results  Component Value Date   AST 26 11/20/2018   ALT 15 11/20/2018   ALBUMIN 3.8 11/20/2018   UDS Summary  Date Value Ref Range Status  02/09/2018 FINAL  Final    Comment:    ==================================================================== TOXASSURE SELECT 13 (MW) ==================================================================== Test                             Result       Flag       Units Drug Present and Declared for Prescription Verification   Oxycodone                      1276         EXPECTED   ng/mg creat   Noroxycodone                   2447  EXPECTED   ng/mg creat    Sources of oxycodone include scheduled prescription medications.    Noroxycodone is an expected metabolite of oxycodone. Drug Absent but Declared for Prescription Verification   Alprazolam                     Not Detected UNEXPECTED ng/mg creat ==================================================================== Test                      Result    Flag   Units      Ref Range   Creatinine              109              mg/dL      >=44 ==================================================================== Declared Medications:  The flagging and interpretation on this report are based on the  following declared medications.  Unexpected results may arise  from  inaccuracies in the declared medications.  **Note: The testing scope of this panel includes these medications:  Alprazolam (Xanax)  Oxycodone  **Note: The testing scope of this panel does not include following  reported medications:  Carvedilol  Duloxetine  Losartan (Losartan Potassium)  Nystatin  Omeprazole (Nexium)  Polymyxin (PolyTrim)  Trimethoprim (PolyTrim) ==================================================================== For clinical consultation, please call [phone]. ====================================================================    Note: Above Lab results reviewed.  Recent imaging  [place] AXILLA RIGHT CLINICAL DATA:  78 year old presenting with a possible palpable lump in the right axilla, intermittently tender. Annual mammographic evaluation.  EXAM: DIGITAL DIAGNOSTIC BILATERAL MAMMOGRAM WITH CAD AND TOMO  ULTRASOUND RIGHT AXILLA  COMPARISON:  Mammography 03/26/2014, 03/01/2013 and earlier. No prior right axillary ultrasound.  ACR Breast Density Category b: There are scattered areas of fibroglandular density.  FINDINGS: Standard 2D and tomosynthesis full field CC and MLO views of both breasts were obtained.  No findings suspicious for malignancy in either breast.  Benign dystrophic calcifications associated with a degenerating fibroadenoma in the lower left breast at middle to posterior depth, unchanged dating back to 2010.  Mammographic images were processed with CAD.  On physical exam, there is no palpable mass or lymphadenopathy in the right axilla in the area of patient concern.  Targeted right axillary ultrasound is performed, showing normal fibrofatty tissue in the area of palpable concern. A normal-appearing level 2 lymph node is present behind the pectoralis muscle deep in the area of palpable concern measuring approximate 1.2 cm. No mass or pathologic lymphadenopathy is identified.  IMPRESSION: 1. No mammographic evidence  of malignancy involving either breast. 2. No pathologic right axillary lymphadenopathy.  RECOMMENDATION: Screening mammogram in one year.(Code:SM-B-01Y)  I have discussed the findings and recommendations with the patient. Results were also provided in writing at the conclusion of the visit. If applicable, a reminder letter will be sent to the patient regarding the next appointment.  BI-RADS CATEGORY  2: Benign.  Electronically Signed   By: Hulan Saas M.D.   On: 11/03/2017 12:04 MM DIAG BREAST TOMO BILATERAL CLINICAL DATA:  78 year old presenting with a possible palpable lump in the right axilla, intermittently tender. Annual mammographic evaluation.  EXAM: DIGITAL DIAGNOSTIC BILATERAL MAMMOGRAM WITH CAD AND TOMO  ULTRASOUND RIGHT AXILLA  COMPARISON:  Mammography 03/26/2014, 03/01/2013 and earlier. No prior right axillary ultrasound.  ACR Breast Density Category b: There are scattered areas of fibroglandular density.  FINDINGS: Standard 2D and tomosynthesis full field CC and MLO views of both breasts were obtained.  No findings suspicious for malignancy in either breast.  Benign dystrophic calcifications associated with a degenerating fibroadenoma in the lower left breast at middle to posterior depth, unchanged dating back to 2010.  Mammographic images were processed with CAD.  On physical exam, there is no palpable mass or lymphadenopathy in the right axilla in the area of patient concern.  Targeted right axillary ultrasound is performed, showing normal fibrofatty tissue in the area of palpable concern. A normal-appearing level 2 lymph node is present behind the pectoralis muscle deep in the area of palpable concern measuring approximate 1.2 cm. No mass or pathologic lymphadenopathy is identified.  IMPRESSION: 1. No mammographic evidence of malignancy involving either breast. 2. No pathologic right axillary lymphadenopathy.  RECOMMENDATION: Screening  mammogram in one year.(Code:SM-B-01Y)  I have discussed the findings and recommendations with the patient. Results were also provided in writing at the conclusion of the visit. If applicable, a reminder letter will be sent to the patient regarding the next appointment.  BI-RADS CATEGORY  2: Benign.  Electronically Signed   By: Hulan Saashomas  Lawrence M.D.   On: 11/03/2017 12:04  Assessment  The primary encounter diagnosis was Chronic pain syndrome. Diagnoses of Chronic knee pain (Primary Area of Pain) (Bilateral) (L>R), Chronic low back pain (Secondary Area of Pain) (Bilateral) (L>R), and Osteoarthritis of knee (Left) were also pertinent to this visit.  Plan of Care  I have discontinued Cynthia Jones's Magnesium, atorvastatin, ondansetron, oxyCODONE, oxyCODONE, and predniSONE. I have also changed her diclofenac sodium and oxyCODONE. Additionally, I am having her start on oxyCODONE and oxyCODONE. Lastly, I am having her maintain her esomeprazole, glucose blood, DULoxetine, losartan, carvedilol, Vitamin D3, calcium carbonate, levothyroxine, ALPRAZolam, nystatin, and ARIPiprazole.  Pharmacotherapy (Medications Ordered): Meds ordered this encounter  Medications  . diclofenac sodium (VOLTAREN) 1 % GEL    Sig: Apply 2 g topically 4 (four) times daily.    Dispense:  350 g    Refill:  7    Fill one day early if pharmacy is closed on scheduled refill date. May substitute for generic if available.  [name]. oxyCODONE (OXY IR/ROXICODONE) 5 MG immediate release tablet    Sig: Take 1 tablet (5 mg total) by mouth every 8 (eight) hours as needed for up to 30 days for severe pain. Must last 30 days    Dispense:  90 tablet    Refill:  0    Chronic Pain: STOP Act - Not applicable. Fill 1 day early if closed on scheduled refill date. Do not fill until: 03/11/2019. To last until: 04/10/2019. Instruct to avoid benzodiazepines within 8 hours of opioid.  [name]. oxyCODONE (OXY IR/ROXICODONE) 5 MG immediate release tablet     Sig: Take 1 tablet (5 mg total) by mouth every 8 (eight) hours as needed for up to 30 days for severe pain. Must last 30 days    Dispense:  90 tablet    Refill:  0    Chronic Pain: STOP Act - Not applicable. Fill 1 day early if closed on scheduled refill date. Do not fill until: 04/10/2019. To last until: 05/10/2019. Instruct to avoid benzodiazepines within 8 hours of opioid.  [name]. oxyCODONE (OXY IR/ROXICODONE) 5 MG immediate release tablet    Sig: Take 1 tablet (5 mg total) by mouth every 8 (eight) hours as needed for up to 30 days for severe pain. Must last 30 days    Dispense:  90 tablet    Refill:  0    Chronic Pain: STOP Act - Not applicable. Fill 1 day early if closed  on scheduled refill date. Do not fill until: 05/10/2019. To last until: 06/09/2019. Instruct to avoid benzodiazepines within 8 hours of opioid.   Orders:  No orders of the defined types were placed in this encounter.  Follow-up plan:   Return in about 3 months (around 05/31/2019) for (VV), E/M (MM).    Recent Visits Date Type Provider Dept  11/23/18 Telemedicine Barbette Merino, NP [facility]  Showing recent visits within past 90 days and meeting all other requirements   Today's Visits Date Type Provider Dept  02/20/19 Office Visit Delano Metz, MD [facility]  Showing today's visits and meeting all other requirements   Future Appointments No visits were found meeting these conditions.  Showing future appointments within next 90 days and meeting all other requirements   I discussed the assessment and treatment plan with the patient. The patient was provided an opportunity to ask questions and all were answered. The patient agreed with the plan and demonstrated an understanding of the instructions.  Patient advised to call back or seek an in-person evaluation if the symptoms or condition worsens.  Total duration of non-face-to-face encounter: 22 minutes.  Note by: Oswaldo Done,  MD Date: 02/20/2019; Time: 12:21 PM  Note: This dictation was prepared with Dragon dictation. Any transcriptional errors that may result from this process are unintentional.  Disclaimer:  * Given the special circumstances of the COVID-19 pandemic, the federal government has announced that the Office for Civil Rights (OCR) will exercise its enforcement discretion and will not impose penalties on physicians using telehealth in the event of noncompliance with regulatory requirements under the [employer] Portability and Accountability Act (HIPAA) in connection with the good faith provision of telehealth during the COVID-19 national public health emergency. (AMA)

## 2019-02-20 NOTE — Patient Instructions (Signed)
____________________________________________________________________________________________  Medication Rules  Purpose: To inform patients, and their family members, of our rules and regulations.  Applies to: All patients receiving prescriptions (written or electronic).  Pharmacy of record: Pharmacy where electronic prescriptions will be sent. If written prescriptions are taken to a different pharmacy, please inform the nursing staff. The pharmacy listed in the electronic medical record should be the one where you would like electronic prescriptions to be sent.  Electronic prescriptions: In compliance with the  Strengthen Opioid Misuse Prevention (STOP) Act of 2017 (Session Law 2017-74/H243), effective August 31, 2018, all controlled substances must be electronically prescribed. Calling prescriptions to the pharmacy will cease to exist.  Prescription refills: Only during scheduled appointments. Applies to all prescriptions.  NOTE: The following applies primarily to controlled substances (Opioid* Pain Medications).   Patient's responsibilities: 1. Pain Pills: Bring all pain pills to every appointment (except for procedure appointments). 2. Pill Bottles: Bring pills in original pharmacy bottle. Always bring the newest bottle. Bring bottle, even if empty. 3. Medication refills: You are responsible for knowing and keeping track of what medications you take and those you need refilled. The day before your appointment: write a list of all prescriptions that need to be refilled. The day of the appointment: give the list to the admitting nurse. Prescriptions will be written only during appointments. No prescriptions will be written on procedure days. If you forget a medication: it will not be "Called in", "Faxed", or "electronically sent". You will need to get another appointment to get these prescribed. No early refills. Do not call asking to have your prescription filled  early. 4. Prescription Accuracy: You are responsible for carefully inspecting your prescriptions before leaving our office. Have the discharge nurse carefully go over each prescription with you, before taking them home. Make sure that your name is accurately spelled, that your address is correct. Check the name and dose of your medication to make sure it is accurate. Check the number of pills, and the written instructions to make sure they are clear and accurate. Make sure that you are given enough medication to last until your next medication refill appointment. 5. Taking Medication: Take medication as prescribed. When it comes to controlled substances, taking less pills or less frequently than prescribed is permitted and encouraged. Never take more pills than instructed. Never take medication more frequently than prescribed.  6. Inform other Doctors: Always inform, all of your healthcare providers, of all the medications you take. 7. Pain Medication from other Providers: You are not allowed to accept any additional pain medication from any other Doctor or Healthcare provider. There are two exceptions to this rule. (see below) In the event that you require additional pain medication, you are responsible for notifying us, as stated below. 8. Medication Agreement: You are responsible for carefully reading and following our Medication Agreement. This must be signed before receiving any prescriptions from our practice. Safely store a copy of your signed Agreement. Violations to the Agreement will result in no further prescriptions. (Additional copies of our Medication Agreement are available upon request.) 9. Laws, Rules, & Regulations: All patients are expected to follow all Federal and State Laws, Statutes, Rules, & Regulations. Ignorance of the Laws does not constitute a valid excuse. The use of any illegal substances is prohibited. 10. Adopted CDC guidelines & recommendations: Target dosing levels will be  at or below 60 MME/day. Use of benzodiazepines** is not recommended.  Exceptions: There are only two exceptions to the rule of not   receiving pain medications from other Healthcare Providers. 1. Exception #1 (Emergencies): In the event of an emergency (i.e.: accident requiring emergency care), you are allowed to receive additional pain medication. However, you are responsible for: As soon as you are able, call our office [phone], at any time of the day or night, and leave a message stating your name, the date and nature of the emergency, and the name and dose of the medication prescribed. In the event that your call is answered by a member of our staff, make sure to document and save the date, time, and the name of the person that took your information.  2. Exception #2 (Planned Surgery): In the event that you are scheduled by another doctor or dentist to have any type of surgery or procedure, you are allowed (for a period no longer than 30 days), to receive additional pain medication, for the acute post-op pain. However, in this case, you are responsible for picking up a copy of our "Post-op Pain Management for Surgeons" handout, and giving it to your surgeon or dentist. This document is available at our office, and does not require an appointment to obtain it. Simply go to our office during business hours (Monday-Thursday from 8:00 AM to 4:00 PM) (Friday 8:00 AM to 12:00 Noon) or if you have a scheduled appointment with us, prior to your surgery, and ask for it by name. In addition, you will need to provide us with your name, name of your surgeon, type of surgery, and date of procedure or surgery.  *Opioid medications include: morphine, codeine, oxycodone, oxymorphone, hydrocodone, hydromorphone, meperidine, tramadol, tapentadol, buprenorphine, fentanyl, methadone. **Benzodiazepine medications include: diazepam (Valium), alprazolam (Xanax), clonazepam (Klonopine), lorazepam (Ativan), clorazepate  (Tranxene), chlordiazepoxide (Librium), estazolam (Prosom), oxazepam (Serax), temazepam (Restoril), triazolam (Halcion) (Last updated: 10/28/2017) ____________________________________________________________________________________________   ____________________________________________________________________________________________  Medication Recommendations and Reminders  Applies to: All patients receiving prescriptions (written and/or electronic).  Medication Rules & Regulations: These rules and regulations exist for your safety and that of others. They are not flexible and neither are we. Dismissing or ignoring them will be considered "non-compliance" with medication therapy, resulting in complete and irreversible termination of such therapy. (See document titled "Medication Rules" for more details.) In all conscience, because of safety reasons, we cannot continue providing a therapy where the patient does not follow instructions.  Pharmacy of record:   Definition: This is the pharmacy where your electronic prescriptions will be sent.   We do not endorse any particular pharmacy.  You are not restricted in your choice of pharmacy.  The pharmacy listed in the electronic medical record should be the one where you want electronic prescriptions to be sent.  If you choose to change pharmacy, simply notify our nursing staff of your choice of new pharmacy.  Recommendations:  Keep all of your pain medications in a safe place, under lock and key, even if you live alone.   After you fill your prescription, take 1 week's worth of pills and put them away in a safe place. You should keep a separate, properly labeled bottle for this purpose. The remainder should be kept in the original bottle. Use this as your primary supply, until it runs out. Once it's gone, then you know that you have 1 week's worth of medicine, and it is time to come in for a prescription refill. If you do this correctly, it  is unlikely that you will ever run out of medicine.  To make sure that the above recommendation works,   it is very important that you make sure your medication refill appointments are scheduled at least 1 week before you run out of medicine. To do this in an effective manner, make sure that you do not leave the office without scheduling your next medication management appointment. Always ask the nursing staff to show you in your prescription , when your medication will be running out. Then arrange for the receptionist to get you a return appointment, at least 7 days before you run out of medicine. Do not wait until you have 1 or 2 pills left, to come in. This is very poor planning and does not take into consideration that we may need to cancel appointments due to bad weather, sickness, or emergencies affecting our staff.  "Partial Fill": If for any reason your pharmacy does not have enough pills/tablets to completely fill or refill your prescription, do not allow for a "partial fill". You will need a separate prescription to fill the remaining amount, which we will not provide. If the reason for the partial fill is your insurance, you will need to talk to the pharmacist about payment alternatives for the remaining tablets, but again, do not accept a partial fill.  Prescription refills and/or changes in medication(s):   Prescription refills, and/or changes in dose or medication, will be conducted only during scheduled medication management appointments. (Applies to both, written and electronic prescriptions.)  No refills on procedure days. No medication will be changed or started on procedure days. No changes, adjustments, and/or refills will be conducted on a procedure day. Doing so will interfere with the diagnostic portion of the procedure.  No phone refills. No medications will be "called into the pharmacy".  No Fax refills.  No weekend refills.  No Holliday refills.  No after hours  refills.  Remember:  Business hours are:  Monday to Thursday 8:00 AM to 4:00 PM Provider's Schedule: Crystal King, NP - Appointments are:  Medication management: Monday to Thursday 8:00 AM to 4:00 PM Clevon Khader, MD - Appointments are:  Medication management: Monday and Wednesday 8:00 AM to 4:00 PM Procedure day: Tuesday and Thursday 7:30 AM to 4:00 PM Bilal Lateef, MD - Appointments are:  Medication management: Tuesday and Thursday 8:00 AM to 4:00 PM Procedure day: Monday and Wednesday 7:30 AM to 4:00 PM (Last update: 10/28/2017) ____________________________________________________________________________________________   

## 2019-02-27 ENCOUNTER — Telehealth: Payer: Self-pay

## 2019-02-27 DIAGNOSIS — I10 Essential (primary) hypertension: Secondary | ICD-10-CM

## 2019-02-27 DIAGNOSIS — R7989 Other specified abnormal findings of blood chemistry: Secondary | ICD-10-CM

## 2019-02-27 NOTE — Telephone Encounter (Signed)
Copied from [place] [id number]. Topic: General - Other >> Feb 27, 2019  3:21 PM Rainey Pines A wrote: Patient would like a callback from nurse in regards to medications prescribed.    Called to speak to patient. She wishes to speak to Dr. Jeananne Rama directly. Advised patient that Dr. Jeananne Rama was already off for the day and that he would call her back tomorrow.

## 2019-02-28 NOTE — Telephone Encounter (Signed)
Phone call Discussed with patient ongoing fatigue feeling bad reviewed back pain and medication possible causes but on chart review patient has not had TSH rechecked as recommended so we will recheck TSH and BMP.

## 2019-03-01 ENCOUNTER — Other Ambulatory Visit: Payer: Self-pay | Admitting: Family Medicine

## 2019-03-01 DIAGNOSIS — E118 Type 2 diabetes mellitus with unspecified complications: Secondary | ICD-10-CM

## 2019-03-01 NOTE — Telephone Encounter (Signed)
Forwarding medication refill to PCP for review. 

## 2019-03-03 ENCOUNTER — Other Ambulatory Visit: Payer: Self-pay | Admitting: Family Medicine

## 2019-03-03 DIAGNOSIS — E118 Type 2 diabetes mellitus with unspecified complications: Secondary | ICD-10-CM

## 2019-03-03 NOTE — Telephone Encounter (Signed)
Medication: nystatin (MYCOSTATIN/NYSTOP) powder [[id number]]   Has the patient contacted their pharmacy? Yes  (Agent: If no, request that the patient contact the pharmacy for the refill.) (Agent: If yes, when and what did the pharmacy advise?)  Preferred Pharmacy (with phone number or street name): [id number] - [place], [place] - [address] [phone] (Phone) [phone] (Fax)    Agent: Please be advised that RX refills may take up to 3 business days. We ask that you follow-up with your pharmacy.

## 2019-03-04 ENCOUNTER — Other Ambulatory Visit: Payer: Self-pay | Admitting: Family Medicine

## 2019-03-04 DIAGNOSIS — F3342 Major depressive disorder, recurrent, in full remission: Secondary | ICD-10-CM

## 2019-03-04 NOTE — Telephone Encounter (Signed)
Requested Prescriptions  Pending Prescriptions Disp Refills  . DULoxetine (CYMBALTA) 60 MG capsule [Pharmacy Med Name: DULOXETINE HCL DR 60 MG CAP] 90 capsule 4    Sig: TAKE 1 CAPSULE BY MOUTH EVERY DAY     Psychiatry: Antidepressants - SNRI Failed - 03/04/2019  8:34 AM      Failed - Last BP in normal range    BP Readings from Last 1 Encounters:  11/20/18 (!) 164/56         Passed - Valid encounter within last 6 months    Recent Outpatient Visits          2 months ago Acute sinusitis, recurrence not specified, unspecified location   [facility] [place], Jeannette How, MD   2 months ago Acute non-recurrent maxillary sinusitis   [facility] [place], [place], [place]   4 months ago [place], [place], [place]   5 months ago Hypothyroidism, unspecified type   [facility] [place], Jeannette How, MD   8 months ago Diabetes mellitus without complication [facility])   [place], [place]      Future Appointments            In 1 month [place], [facility]            Passed - Completed PHQ-2 or PHQ-9 in the last 360 days.

## 2019-03-06 MED ORDER — NYSTATIN 100000 UNIT/GM EX POWD: Freq: Three times a day (TID) | CUTANEOUS | 1 refills | Status: DC

## 2019-03-07 ENCOUNTER — Other Ambulatory Visit: Payer: Self-pay | Admitting: Pain Medicine

## 2019-03-07 ENCOUNTER — Other Ambulatory Visit: Payer: Medicare HMO

## 2019-03-07 DIAGNOSIS — E559 Vitamin D deficiency, unspecified: Secondary | ICD-10-CM

## 2019-03-09 ENCOUNTER — Other Ambulatory Visit: Payer: Self-pay

## 2019-03-09 ENCOUNTER — Other Ambulatory Visit: Payer: Medicare HMO

## 2019-03-09 DIAGNOSIS — I10 Essential (primary) hypertension: Secondary | ICD-10-CM | POA: Diagnosis not present

## 2019-03-09 DIAGNOSIS — R7989 Other specified abnormal findings of blood chemistry: Secondary | ICD-10-CM

## 2019-03-10 LAB — BASIC METABOLIC PANEL
BUN/Creatinine Ratio: 18 (ref 12–28)
BUN: 13 mg/dL (ref 8–27)
CO2: 25 mmol/L (ref 20–29)
Calcium: 9 mg/dL (ref 8.7–10.3)
Chloride: 101 mmol/L (ref 96–106)
Creatinine, Ser: 0.74 mg/dL (ref 0.57–1.00)
GFR calc Af Amer: 91 mL/min/{1.73_m2} (ref >59)
GFR calc non Af Amer: 79 mL/min/{1.73_m2} (ref >59)
Glucose: 104 mg/dL — ABNORMAL HIGH (ref 65–99)
Potassium: 4.7 mmol/L (ref 3.5–5.2)
Sodium: 139 mmol/L (ref 134–144)

## 2019-03-10 LAB — TSH: TSH: 2.34 u[IU]/mL (ref 0.450–4.500)

## 2019-03-12 ENCOUNTER — Encounter: Payer: Self-pay | Admitting: Family Medicine

## 2019-03-12 ENCOUNTER — Other Ambulatory Visit: Payer: Self-pay | Admitting: Family Medicine

## 2019-03-12 DIAGNOSIS — F419 Anxiety disorder, unspecified: Secondary | ICD-10-CM

## 2019-03-13 ENCOUNTER — Other Ambulatory Visit: Payer: Self-pay | Admitting: Family Medicine

## 2019-03-13 NOTE — Telephone Encounter (Signed)
Please advise 

## 2019-04-12 ENCOUNTER — Ambulatory Visit (INDEPENDENT_AMBULATORY_CARE_PROVIDER_SITE_OTHER): Payer: Medicare HMO

## 2019-04-12 VITALS — Ht 58.9 in | Wt 220.0 lb

## 2019-04-12 DIAGNOSIS — Z0000 Encounter for general adult medical examination without abnormal findings: Secondary | ICD-10-CM | POA: Diagnosis not present

## 2019-04-12 DIAGNOSIS — Z1239 Encounter for other screening for malignant neoplasm of breast: Secondary | ICD-10-CM | POA: Diagnosis not present

## 2019-04-12 NOTE — Patient Instructions (Signed)
Ms. Cynthia Jones , Thank you for taking time to come for your Medicare Wellness Visit. I appreciate your ongoing commitment to your health goals. Please review the following plan we discussed and let me know if I can assist you in the future.   Screening recommendations/referrals: Colonoscopy: no longer required  Mammogram: Please call [phone] to schedule your mammogram.  Bone Density: up to date Recommended yearly ophthalmology/optometry visit for glaucoma screening and checkup Recommended yearly dental visit for hygiene and checkup  Vaccinations: Influenza vaccine: due 05/2019 Pneumococcal vaccine: up to date Tdap vaccine: up to date Shingles vaccine: shingrix eligible, check with your insurance company for coverage    Advanced directives: health care power of attorney on file, please bring a copy of your living will to your next visit.   Conditions/risks identified: diabetic- discussed chronic care management team   Next appointment: Follow up in one year for your annual wellness visit.    Preventive Care 59 Years and Older, Female Preventive care refers to lifestyle choices and visits with your health care provider that can promote health and wellness. What does preventive care include?  A yearly physical exam. This is also called an annual well check.  Dental exams once or twice a year.  Routine eye exams. Ask your health care provider how often you should have your eyes checked.  Personal lifestyle choices, including:  Daily care of your teeth and gums.  Regular physical activity.  Eating a healthy diet.  Avoiding tobacco and drug use.  Limiting alcohol use.  Practicing safe sex.  Taking low-dose aspirin every day.  Taking vitamin and mineral supplements as recommended by your health care provider. What happens during an annual well check? The services and screenings done by your health care provider during your annual well check will depend on your age,  overall health, lifestyle risk factors, and family history of disease. Counseling  Your health care provider may ask you questions about your:  Alcohol use.  Tobacco use.  Drug use.  Emotional well-being.  Home and relationship well-being.  Sexual activity.  Eating habits.  History of falls.  Memory and ability to understand (cognition).  Work and work Statistician.  Reproductive health. Screening  You may have the following tests or measurements:  Height, weight, and BMI.  Blood pressure.  Lipid and cholesterol levels. These may be checked every 5 years, or more frequently if you are over 69 years old.  Skin check.  Lung cancer screening. You may have this screening every year starting at age 76 if you have a 30-pack-year history of smoking and currently smoke or have quit within the past 15 years.  Fecal occult blood test (FOBT) of the stool. You may have this test every year starting at age 76.  Flexible sigmoidoscopy or colonoscopy. You may have a sigmoidoscopy every 5 years or a colonoscopy every 10 years starting at age 76.  Hepatitis C blood test.  Hepatitis B blood test.  Sexually transmitted disease (STD) testing.  Diabetes screening. This is done by checking your blood sugar (glucose) after you have not eaten for a while (fasting). You may have this done every 1-3 years.  Bone density scan. This is done to screen for osteoporosis. You may have this done starting at age 76.  Mammogram. This may be done every 1-2 years. Talk to your health care provider about how often you should have regular mammograms. Talk with your health care provider about your test results, treatment options, and if necessary,  the need for more tests. Vaccines  Your health care provider may recommend certain vaccines, such as:  Influenza vaccine. This is recommended every year.  Tetanus, diphtheria, and acellular pertussis (Tdap, Td) vaccine. You may need a Td booster every 10  years.  Zoster vaccine. You may need this after age 76.  Pneumococcal 13-valent conjugate (PCV13) vaccine. One dose is recommended after age 76.  Pneumococcal polysaccharide (PPSV23) vaccine. One dose is recommended after age 75. Talk to your health care provider about which screenings and vaccines you need and how often you need them. This information is not intended to replace advice given to you by your health care provider. Make sure you discuss any questions you have with your health care provider. Document Released: 09/13/2015 Document Revised: 05/06/2016 Document Reviewed: 06/18/2015 Elsevier Interactive Patient Education  2017 [place] Prevention in the Home Falls can cause injuries. They can happen to people of all ages. There are many things you can do to make your home safe and to help prevent falls. What can I do on the outside of my home?  Regularly fix the edges of walkways and driveways and fix any cracks.  Remove anything that might make you trip as you walk through a door, such as a raised step or threshold.  Trim any bushes or trees on the path to your home.  Use bright outdoor lighting.  Clear any walking paths of anything that might make someone trip, such as rocks or tools.  Regularly check to see if handrails are loose or broken. Make sure that both sides of any steps have handrails.  Any raised decks and porches should have guardrails on the edges.  Have any leaves, snow, or ice cleared regularly.  Use sand or salt on walking paths during winter.  Clean up any spills in your garage right away. This includes oil or grease spills. What can I do in the bathroom?  Use night lights.  Install grab bars by the toilet and in the tub and shower. Do not use towel bars as grab bars.  Use non-skid mats or decals in the tub or shower.  If you need to sit down in the shower, use a plastic, non-slip stool.  Keep the floor dry. Clean up any water that  spills on the floor as soon as it happens.  Remove soap buildup in the tub or shower regularly.  Attach bath mats securely with double-sided non-slip rug tape.  Do not have throw rugs and other things on the floor that can make you trip. What can I do in the bedroom?  Use night lights.  Make sure that you have a light by your bed that is easy to reach.  Do not use any sheets or blankets that are too big for your bed. They should not hang down onto the floor.  Have a firm chair that has side arms. You can use this for support while you get dressed.  Do not have throw rugs and other things on the floor that can make you trip. What can I do in the kitchen?  Clean up any spills right away.  Avoid walking on wet floors.  Keep items that you use a lot in easy-to-reach places.  If you need to reach something above you, use a strong step stool that has a grab bar.  Keep electrical cords out of the way.  Do not use floor polish or wax that makes floors slippery. If you must use  wax, use non-skid floor wax.  Do not have throw rugs and other things on the floor that can make you trip. What can I do with my stairs?  Do not leave any items on the stairs.  Make sure that there are handrails on both sides of the stairs and use them. Fix handrails that are broken or loose. Make sure that handrails are as long as the stairways.  Check any carpeting to make sure that it is firmly attached to the stairs. Fix any carpet that is loose or worn.  Avoid having throw rugs at the top or bottom of the stairs. If you do have throw rugs, attach them to the floor with carpet tape.  Make sure that you have a light switch at the top of the stairs and the bottom of the stairs. If you do not have them, ask someone to add them for you. What else can I do to help prevent falls?  Wear shoes that:  Do not have high heels.  Have rubber bottoms.  Are comfortable and fit you well.  Are closed at the  toe. Do not wear sandals.  If you use a stepladder:  Make sure that it is fully opened. Do not climb a closed stepladder.  Make sure that both sides of the stepladder are locked into place.  Ask someone to hold it for you, if possible.  Clearly mark and make sure that you can see:  Any grab bars or handrails.  First and last steps.  Where the edge of each step is.  Use tools that help you move around (mobility aids) if they are needed. These include:  Canes.  Walkers.  Scooters.  Crutches.  Turn on the lights when you go into a dark area. Replace any light bulbs as soon as they burn out.  Set up your furniture so you have a clear path. Avoid moving your furniture around.  If any of your floors are uneven, fix them.  If there are any pets around you, be aware of where they are.  Review your medicines with your doctor. Some medicines can make you feel dizzy. This can increase your chance of falling. Ask your doctor what other things that you can do to help prevent falls. This information is not intended to replace advice given to you by your health care provider. Make sure you discuss any questions you have with your health care provider. Document Released: 06/13/2009 Document Revised: 01/23/2016 Document Reviewed: 09/21/2014 Elsevier Interactive Patient Education  2017 [employer].

## 2019-04-12 NOTE — Progress Notes (Signed)
Subjective:   Cynthia Jones is a 78 y.o. female who presents for Medicare Annual (Subsequent) preventive examination.  This visit is being conducted via phone call  - after an attmept to do on video chat - due to the COVID-19 pandemic. This patient has given me verbal consent via phone to conduct this visit, patient states they are participating from their home address. Some vital signs may be absent or patient reported.   Patient identification: identified by name, DOB, and current address.    Review of Systems:   Cardiac Risk Factors include: advanced age (>70men, >67 women);hypertension;diabetes mellitus;dyslipidemia;obesity (BMI >30kg/m2)     Objective:     Vitals: Ht 4' 10.9" (1.496 m) Comment: pt reported  Wt 220 lb (99.8 kg) Comment: pt reported  BMI 44.59 kg/m   Body mass index is 44.59 kg/m.  Advanced Directives 04/12/2019 11/20/2018 11/01/2018 08/08/2018 05/09/2018 04/07/2018 02/09/2018  Does Patient Have a Medical Advance Directive? Yes Yes Yes No No Yes Yes  Type of Advance Directive Living will;Healthcare Power of [employer] Power of [employer] Power of [place];Living will - - Living will;Healthcare Power of [employer] of [place];Living will  Does patient want to make changes to medical advance directive? - No - Patient declined - - - - -  Copy of Healthcare Power of Attorney in Chart? Yes - validated most recent copy scanned in chart (See row information) No - copy requested - - - No - copy requested No - copy requested  Would patient like information on creating a medical advance directive? - - - No - Patient declined No - Patient declined - -    Tobacco Social History   Tobacco Use  Smoking Status Never Smoker  Smokeless Tobacco Never Used     Counseling given: Not Answered   Clinical Intake:  Pre-visit preparation completed: Yes  Pain : 0-10 Pain Score: 2  Pain Type: Chronic pain Pain Location: Generalized Pain Onset:  More than a month ago Pain Frequency: Constant     Nutritional Status: BMI > 30  Obese Nutritional Risks: None Diabetes: Yes CBG done?: No Did pt. bring in CBG monitor from home?: No  How often do you need to have someone help you when you read instructions, pamphlets, or other written materials from your doctor or pharmacy?: 1 - Never  Nutrition Risk Assessment:  Has the patient had any N/V/D within the last 2 months?  No  Does the patient have any non-healing wounds?  No  Has the patient had any unintentional weight loss or weight gain?  No   Diabetes:  Is the patient diabetic?  Yes  If diabetic, was a CBG obtained today?  No  Did the patient bring in their glucometer from home?  No  How often do you monitor your CBG's? havent been checking recently.   Financial Strains and Diabetes Management:  Are you having any financial strains with the device, your supplies or your medication? No .  Does the patient want to be seen by Chronic Care Management for management of their diabetes?  No  Would the patient like to be referred to a Nutritionist or for Diabetic Management?  No   Diabetic Exams:  Diabetic Eye Exam: Completed 06/28/2018.   Diabetic Foot Exam: Completed 06/14/2018.  Pt has been advised about the importance in completing this exam.    Interpreter Needed?: No  Information entered by :: Tiffany Hill,LPN  Past Medical History:  Diagnosis Date  . Acute  anxiety 03/06/2015  . Allergy   . Anxiety   . Degenerative joint disease of knee   . Depression   . Fibromyalgia   . Obesity   . Osteopenia   . RAD (reactive airway disease)    Past Surgical History:  Procedure Laterality Date  . ABDOMINAL HYSTERECTOMY    . APPENDECTOMY    . BREAST CYST ASPIRATION Right 04/18/2013   neg  . CHOLECYSTECTOMY    . EYE SURGERY    . JOINT REPLACEMENT  2014   right knee  . PAROTIDECTOMY Left   . TONSILLECTOMY     Family History  Problem Relation Age of Onset  . Heart  disease Mother   . Stroke Mother   . Diabetes Mother   . Alzheimer's disease Father   . Parkinson's disease Father   . Cancer Sister    Social History   Socioeconomic History  . Marital status: Widowed    Spouse name: Not on file  . Number of children: Not on file  . Years of education: Not on file  . Highest education level: 10th grade  Occupational History  . Not on file  Social Needs  . Financial resource strain: Not very hard  . Food insecurity    Worry: Never true    Inability: Never true  . Transportation needs    Medical: No    Non-medical: No  Tobacco Use  . Smoking status: Never Smoker  . Smokeless tobacco: Never Used  Substance and Sexual Activity  . Alcohol use: No    Alcohol/week: 0.0 standard drinks  . Drug use: No  . Sexual activity: Not on file  Lifestyle  . Physical activity    Days per week: 0 days    Minutes per session: 0 min  . Stress: Not at all  Relationships  . Social connections    Talks on phone: More than three times a week    Gets together: More than three times a week    Attends religious service: More than 4 times per year    Active member of club or organization: No    Attends meetings of clubs or organizations: Never    Relationship status: Widowed  Other Topics Concern  . Not on file  Social History Narrative  . Not on file    Outpatient Encounter Medications as of 04/12/2019  Medication Sig  . ALPRAZolam (XANAX) 0.25 MG tablet TAKE 1 TABLET BY MOUTH EVERY DAY AS NEEDED FOR ANXIETY  . ARIPiprazole (ABILIFY) 2 MG tablet TAKE 1 TABLET BY MOUTH EVERY DAY  . carvedilol (COREG) 6.25 MG tablet Take 1 tablet (6.25 mg total) by mouth 2 (two) times daily with a meal.  . diclofenac sodium (VOLTAREN) 1 % GEL Apply 2 g topically 4 (four) times daily.  . DULoxetine (CYMBALTA) 60 MG capsule TAKE 1 CAPSULE BY MOUTH EVERY DAY  . esomeprazole (NEXIUM) 40 MG capsule Take 40 mg by mouth daily at 12 noon.  [name]. glucose blood (IGLUCOSE TEST STRIPS)  test strip Use as instructed - freestyle insulin x strip  . levothyroxine (SYNTHROID, LEVOTHROID) 50 MCG tablet Take 1 tablet (50 mcg total) by mouth daily.  [name]. losartan (COZAAR) 100 MG tablet Take 1 tablet (100 mg total) by mouth daily.  [name]. nystatin (MYCOSTATIN/NYSTOP) powder APPLY TO AFFECTED AREA 4 TIMES A DAY  . oxyCODONE (OXY IR/ROXICODONE) 5 MG immediate release tablet Take 1 tablet (5 mg total) by mouth every 8 (eight) hours as needed for up to 30  days for severe pain. Must last 30 days  . [DISCONTINUED] nystatin (MYCOSTATIN/NYSTOP) powder Apply topically 3 (three) times daily.  [name] oxyCODONE (OXY IR/ROXICODONE) 5 MG immediate release tablet Take 1 tablet (5 mg total) by mouth every 8 (eight) hours as needed for up to 30 days for severe pain. Must last 30 days  . [START ON 05/10/2019] oxyCODONE (OXY IR/ROXICODONE) 5 MG immediate release tablet Take 1 tablet (5 mg total) by mouth every 8 (eight) hours as needed for up to 30 days for severe pain. Must last 30 days (Patient not taking: Reported on 04/12/2019)  . [DISCONTINUED] calcium carbonate (CALCIUM 600) 600 MG TABS tablet Take 1 tablet (600 mg total) by mouth 2 (two) times daily with a meal. (Patient not taking: Reported on 04/12/2019)   No facility-administered encounter medications on file as of 04/12/2019.     Activities of Daily Living In your present state of health, do you have any difficulty performing the following activities: 04/12/2019  Hearing? N  Comment no hearing aids  Vision? N  Comment eyeglasses  Difficulty concentrating or making decisions? N  Walking or climbing stairs? Y  Dressing or bathing? N  Doing errands, shopping? N  Preparing Food and eating ? N  Using the Toilet? N  In the past six months, have you accidently leaked urine? N  Do you have problems with loss of bowel control? N  Managing your Medications? N  Managing your Finances? N  Housekeeping or managing your Housekeeping? N  Some recent data might be  hidden    Patient Care Team: Guadalupe Maple, MD as PCP - General (Family Medicine)    Assessment:   This is a routine wellness examination for [place].  Exercise Activities and Dietary recommendations Current Exercise Habits: Home exercise routine, Type of exercise: walking, Time (Minutes): 10, Frequency (Times/Week): 7, Weekly Exercise (Minutes/Week): 70, Intensity: Mild, Exercise limited by: None identified  Goals    . Increase water intake     Recommend drinking at least 6-8 glasses of water a day       Fall Risk: Fall Risk  04/12/2019 11/01/2018 10/18/2018 10/04/2018 09/20/2018  Falls in the past year? 0 0 0 0 0  Comment - - - - -  Number falls in past yr: - - - - 0  Injury with Fall? - - - - 0  Risk Factor Category  - - - - -  Risk for fall due to : - - - - -  Risk for fall due to: Comment - - - - -  Follow up - - - - -  Comment - - - - -    FALL RISK PREVENTION PERTAINING TO THE HOME:  Any stairs in or around the home? No  If so, are there any without handrails? No   Home free of loose throw rugs in walkways, pet beds, electrical cords, etc? Yes  Adequate lighting in your home to reduce risk of falls? Yes   ASSISTIVE DEVICES UTILIZED TO PREVENT FALLS:  Life alert? No  Use of a cane, walker or w/c? Yes  cane  Grab bars in the bathroom? Yes  Shower chair or bench in shower? Yes  Elevated toilet seat or a handicapped toilet? Yes   TIMED UP AND GO:  Unable to perform    Depression Screen PHQ 2/9 Scores 04/12/2019 11/01/2018 10/18/2018 09/20/2018  PHQ - 2 Score 0 0 0 0  PHQ- 9 Score - - - -  Exception Documentation - - -  Patient refusal     Cognitive Function     6CIT Screen 04/07/2018 03/05/2017  What Year? 0 points 0 points  What month? 0 points 0 points  What time? 0 points 0 points  Count back from 20 0 points 0 points  Months in reverse 0 points 0 points  Repeat phrase 0 points 2 points  Total Score 0 2    Immunization History  Administered Date(s)  Administered  . Influenza, High Dose Seasonal PF 06/10/2016, 06/22/2017, 06/14/2018  . Influenza,inj,Quad PF,6+ Mos 06/11/2015  . Pneumococcal Conjugate-13 03/05/2014  . Pneumococcal Polysaccharide-23 01/19/2008  . Td 01/19/2008  . Tdap 04/07/2018    Qualifies for Shingles Vaccine? Yes  Zostavax completed n/a. Due for Shingrix. Education has been provided regarding the importance of this vaccine. Pt has been advised to call insurance company to determine out of pocket expense. Advised may also receive vaccine at local pharmacy or Health Dept. Verbalized acceptance and understanding.  Tdap: up to date   Flu Vaccine: Due 05/2019  Pneumococcal Vaccine: up to date   Screening Tests Health Maintenance  Topic Date Due  . HEMOGLOBIN A1C  12/14/2018  . INFLUENZA VACCINE  04/01/2019  . FOOT EXAM  06/15/2019  . OPHTHALMOLOGY EXAM  06/29/2019  . TETANUS/TDAP  04/07/2028  . DEXA SCAN  Completed  . PNA vac Low Risk Adult  Completed    Cancer Screenings:  Colorectal Screening: no longer required   Mammogram:  Ordered   Bone Density: Completed 03/26/2014  Lung Cancer Screening: (Low Dose CT Chest recommended if Age 67-80 years, 30 pack-year currently smoking OR have quit w/in 15years.) does not qualify.    Additional Screening:  Hepatitis C Screening: does not qualify  Dental Screening: Recommended annual dental exams for proper oral hygiene   Community Resource Referral:  CRR required this visit?  No       Plan:  I have personally reviewed and addressed the Medicare Annual Wellness questionnaire and have noted the following in the patient's chart:  A. Medical and social history B. Use of alcohol, tobacco or illicit drugs  C. Current medications and supplements D. Functional ability and status E.  Nutritional status F.  Physical activity G. Advance directives H. List of other physicians I.  Hospitalizations, surgeries, and ER visits in previous 12 months J.  Vitals K.  Screenings such as hearing and vision if needed, cognitive and depression L. Referrals and appointments   In addition, I have reviewed and discussed with patient certain preventive protocols, quality metrics, and best practice recommendations. A written personalized care plan for preventive services as well as general preventive health recommendations were provided to patient.   Signed,    Collene SchlichterHill, Tiffany A, LPN  1/61/09608/08/2019 Nurse Health Advisor   Nurse Notes: needs refill on tests strips and lancets.

## 2019-04-17 ENCOUNTER — Other Ambulatory Visit: Payer: Self-pay | Admitting: Family Medicine

## 2019-04-17 MED ORDER — IGLUCOSE TEST STRIPS VI STRP: ORAL_STRIP | 12 refills | Status: DC

## 2019-04-17 NOTE — Telephone Encounter (Signed)
Routing to provider  

## 2019-04-17 NOTE — Telephone Encounter (Signed)
Please advise these were last prescribed x 2 years ago

## 2019-04-17 NOTE — Telephone Encounter (Signed)
Medication Refill - Medication: glucose blood (IGLUCOSE TEST STRIPS) test strip freestyle insulin/lancets/Pt stated she is out of test strips and lancets  Has the patient contacted their pharmacy? No. (Agent: If no, request that the patient contact the pharmacy for the refill.) (Agent: If yes, when and what did the pharmacy advise?)  Preferred Pharmacy (with phone number or street name):  [id number] - [place], [place] - [address] [phone] (Phone) [phone] (Fax)     Agent: Please be advised that RX refills may take up to 3 business days. We ask that you follow-up with your pharmacy.

## 2019-05-17 ENCOUNTER — Other Ambulatory Visit: Payer: Self-pay | Admitting: Family Medicine

## 2019-05-17 DIAGNOSIS — E118 Type 2 diabetes mellitus with unspecified complications: Secondary | ICD-10-CM

## 2019-05-17 NOTE — Telephone Encounter (Signed)
Forwarding medication refill request to PCP for review. 

## 2019-05-18 ENCOUNTER — Encounter: Payer: Self-pay | Admitting: Pain Medicine

## 2019-05-20 ENCOUNTER — Ambulatory Visit: Payer: Medicare HMO

## 2019-05-21 NOTE — Progress Notes (Signed)
Pain Management Virtual Encounter Note - Virtual Visit via Telephone Telehealth (real-time audio visits between healthcare provider and patient).   Patient's Phone No. & Preferred Pharmacy:  [phone] (home); [phone] (mobile); (Preferred) [phone] [email or web address]  [facility] [phone] [facility] [place] - [address] [place] [address] Phone: [phone] Fax: [phone]    Pre-screening note:  Our staff contacted Cynthia Jones and offered her an "in person", "face-to-face" appointment versus a telephone encounter. She indicated preferring the telephone encounter, at this time.   Reason for Virtual Visit: COVID-19*  Social distancing based on CDC and AMA recommendations.   I contacted Cynthia Jones on 05/22/2019 via telephone.      I clearly identified myself as Oswaldo Done, MD. I verified that I was speaking with the correct person using two identifiers (Name: Cynthia Jones, and date of birth: 10-15-1940).  Advanced Informed Consent I sought verbal advanced consent from [name] for virtual visit interactions. I informed Cynthia Jones of possible security and privacy concerns, risks, and limitations associated with providing "not-in-person" medical evaluation and management services. I also informed Cynthia Jones of the availability of "in-person" appointments. Finally, I informed her that there would be a charge for the virtual visit and that she could be  personally, fully or partially, financially responsible for it. [name] expressed understanding and agreed to proceed.   Historic Elements   Cynthia Jones is a 78 y.o. year old, female patient evaluated today after her last encounter by our practice on 03/07/2019. Cynthia Jones  has a past medical history of Acute anxiety (03/06/2015), Allergy, Anxiety, Degenerative joint disease of knee, Depression, Fibromyalgia, Obesity, Osteopenia, and RAD (reactive  airway disease). She also  has a past surgical history that includes Parotidectomy (Left); Appendectomy; Cholecystectomy; Abdominal hysterectomy; Tonsillectomy; Joint replacement (2014); Eye surgery; and Breast cyst aspiration (Right, 04/18/2013). [name] has a current medication list which includes the following prescription(s): alprazolam, aripiprazole, carvedilol, diclofenac sodium, duloxetine, esomeprazole, iglucose test strips, levothyroxine, losartan, oxycodone, oxycodone, oxycodone, and nystatin. She  reports that she has never smoked. She has never used smokeless tobacco. She reports that she does not drink alcohol or use drugs. Cynthia Jones is allergic to nsaids and erythromycin.   HPI  Today, she is being contacted for medication management.  The patient indicates doing well with the current medication regimen. No adverse reactions or side effects reported to the medications.   Pharmacotherapy Assessment  Analgesic: Oxycodone IR 5 mg, 1 tab PO q 8 hrs (15 mg/day of oxycodone) MME/day:15mg /day.   Monitoring: Pharmacotherapy: No side-effects or adverse reactions reported. [place] PMP: PDMP reviewed during this encounter.       Compliance: No problems identified. Effectiveness: Clinically acceptable. Plan: Refer to "POC".  UDS:  Summary  Date Value Ref Range Status  02/09/2018 FINAL  Final    Comment:    ==================================================================== TOXASSURE SELECT 13 (MW) ==================================================================== Test                             Result       Flag       Units Drug Present and Declared for Prescription Verification   Oxycodone                      1276         EXPECTED   ng/mg creat   Noroxycodone  2447         EXPECTED   ng/mg creat    Sources of oxycodone include scheduled prescription medications.    Noroxycodone is an expected metabolite of oxycodone. Drug Absent but Declared for  Prescription Verification   Alprazolam                     Not Detected UNEXPECTED ng/mg creat ==================================================================== Test                      Result    Flag   Units      Ref Range   Creatinine              109              mg/dL      >=20 ==================================================================== Declared Medications:  The flagging and interpretation on this report are based on the  following declared medications.  Unexpected results may arise from  inaccuracies in the declared medications.  **Note: The testing scope of this panel includes these medications:  Alprazolam (Xanax)  Oxycodone  **Note: The testing scope of this panel does not include following  reported medications:  Carvedilol  Duloxetine  Losartan (Losartan Potassium)  Nystatin  Omeprazole (Nexium)  Polymyxin (PolyTrim)  Trimethoprim (PolyTrim) ==================================================================== For clinical consultation, please call [phone]. ====================================================================    Laboratory Chemistry Profile (12 mo)  Renal: 03/09/2019: BUN 13; BUN/Creatinine Ratio 18; Creatinine, Ser 0.74  Lab Results  Component Value Date   GFRAA 91 03/09/2019   GFRNONAA 79 03/09/2019   Hepatic: 11/20/2018: Albumin 3.8 Lab Results  Component Value Date   AST 26 11/20/2018   ALT 15 11/20/2018   Other: 08/08/2018: 25-Hydroxy, Vitamin D 20; 25-Hydroxy, Vitamin D-2 <1.0; 25-Hydroxy, Vitamin D-3 20; CRP 17; Sed Rate 94; Vitamin B-12 406 Note: Above Lab results reviewed.  Imaging  Last 90 days:  No results found.  Assessment  Diagnoses of Osteoarthritis of knee (Left), Chronic knee pain (Primary Area of Pain) (Bilateral) (L>R), and Chronic pain syndrome were pertinent to this visit.  Plan of Care  I have discontinued Jerene Pitch. Aubuchon's oxyCODONE and oxyCODONE. I have also changed her oxyCODONE. Additionally, I  am having her start on oxyCODONE and oxyCODONE. Lastly, I am having her maintain her esomeprazole, losartan, carvedilol, levothyroxine, ARIPiprazole, diclofenac sodium, DULoxetine, ALPRAZolam, and IGlucose Test Strips.  Pharmacotherapy (Medications Ordered): Meds ordered this encounter  Medications  . oxyCODONE (OXY IR/ROXICODONE) 5 MG immediate release tablet    Sig: Take 1 tablet (5 mg total) by mouth every 8 (eight) hours as needed for severe pain. Must last 30 days    Dispense:  90 tablet    Refill:  0    Chronic Pain: STOP Act (Not applicable) Fill 1 day early if closed on refill date. Do not fill until: 06/09/2019. To last until: 07/09/2019. Avoid benzodiazepines within 8 hours of opioids  . oxyCODONE (OXY IR/ROXICODONE) 5 MG immediate release tablet    Sig: Take 1 tablet (5 mg total) by mouth every 8 (eight) hours as needed for severe pain. Must last 30 days    Dispense:  90 tablet    Refill:  0    Chronic Pain: STOP Act (Not applicable) Fill 1 day early if closed on refill date. Do not fill until: 07/09/2019. To last until: 08/08/2019. Avoid benzodiazepines within 8 hours of opioids  . oxyCODONE (OXY IR/ROXICODONE) 5 MG immediate release tablet    Sig: Take 1  tablet (5 mg total) by mouth every 8 (eight) hours as needed for severe pain. Must last 30 days    Dispense:  90 tablet    Refill:  0    Chronic Pain: STOP Act (Not applicable) Fill 1 day early if closed on refill date. Do not fill until: 08/08/2019. To last until: 09/07/2019. Avoid benzodiazepines within 8 hours of opioids   Orders:  No orders of the defined types were placed in this encounter.  Follow-up plan:   Return in about 4 months (around 09/06/2019) for (VV), (MM).      Interventional management options: Planned, scheduled, and/or pending:   No further increases on opioid analgesics    Considering:   Possible right Genicular nerve RFA  Diagnostic left Genicular NB Possibleleft Genicular nerve RFA Possible bilateral  lumbar facet RFA Diagnostic left LESI Diagnostic caudal ESI + epidurogram Possible Racz procedure Diagnostic bilateral cervical facet block Possible bilateral cervical facet RFA Diagnostic right versus left CESI Diagnostic bilateral IA shoulder joint injection Diagnostic bilateral suprascapular NB Possible bilateral suprascapular nerve RFA   Palliative PRN treatment(s):   Diagnostic right Genicular NB #2(90/90/50)  Diagnosticleft IA knee joint injection #2(100/100/90) Palliativeleft IA Hyalgan Knee injection S2N1 Diagnostic bilateral lumbar facet block #2    Recent Visits No visits were found meeting these conditions.  Showing recent visits within past 90 days and meeting all other requirements   Today's Visits Date Type Provider Dept  05/22/19 Office Visit Delano Metz, MD [facility]  Showing today's visits and meeting all other requirements   Future Appointments No visits were found meeting these conditions.  Showing future appointments within next 90 days and meeting all other requirements   I discussed the assessment and treatment plan with the patient. The patient was provided an opportunity to ask questions and all were answered. The patient agreed with the plan and demonstrated an understanding of the instructions.  Patient advised to call back or seek an in-person evaluation if the symptoms or condition worsens.  Total duration of non-face-to-face encounter: 15 minutes.  Note by: Oswaldo Done, MD Date: 05/22/2019; Time: 3:26 PM  Note: This dictation was prepared with Dragon dictation. Any transcriptional errors that may result from this process are unintentional.  Disclaimer:  * Given the special circumstances of the COVID-19 pandemic, the federal government has announced that the Office for Civil Rights (OCR) will exercise its enforcement discretion and will not impose penalties on physicians using telehealth in the event of  noncompliance with regulatory requirements under the [employer] Portability and Accountability Act (HIPAA) in connection with the good faith provision of telehealth during the COVID-19 national public health emergency. (AMA)

## 2019-05-22 ENCOUNTER — Other Ambulatory Visit: Payer: Self-pay

## 2019-05-22 ENCOUNTER — Ambulatory Visit: Payer: Medicare HMO | Attending: Pain Medicine | Admitting: Pain Medicine

## 2019-05-22 DIAGNOSIS — G8929 Other chronic pain: Secondary | ICD-10-CM

## 2019-05-22 DIAGNOSIS — G894 Chronic pain syndrome: Secondary | ICD-10-CM

## 2019-05-22 DIAGNOSIS — M25562 Pain in left knee: Secondary | ICD-10-CM

## 2019-05-22 DIAGNOSIS — M1712 Unilateral primary osteoarthritis, left knee: Secondary | ICD-10-CM

## 2019-05-22 DIAGNOSIS — M25561 Pain in right knee: Secondary | ICD-10-CM | POA: Diagnosis not present

## 2019-05-22 MED ORDER — OXYCODONE HCL 5 MG PO TABS: 5.0000 mg | ORAL_TABLET | Freq: Three times a day (TID) | ORAL | 0 refills | Status: DC | PRN

## 2019-05-22 NOTE — Patient Instructions (Signed)
____________________________________________________________________________________________  Medication Rules  Purpose: To inform patients, and their family members, of our rules and regulations.  Applies to: All patients receiving prescriptions (written or electronic).  Pharmacy of record: Pharmacy where electronic prescriptions will be sent. If written prescriptions are taken to a different pharmacy, please inform the nursing staff. The pharmacy listed in the electronic medical record should be the one where you would like electronic prescriptions to be sent.  Electronic prescriptions: In compliance with the [place] Strengthen Opioid Misuse Prevention (STOP) Act of 2017 (Session Law 2017-74/H243), effective August 31, 2018, all controlled substances must be electronically prescribed. Calling prescriptions to the pharmacy will cease to exist.  Prescription refills: Only during scheduled appointments. Applies to all prescriptions.  NOTE: The following applies primarily to controlled substances (Opioid* Pain Medications).   Patient's responsibilities: 1. Pain Pills: Bring all pain pills to every appointment (except for procedure appointments). 2. Pill Bottles: Bring pills in original pharmacy bottle. Always bring the newest bottle. Bring bottle, even if empty. 3. Medication refills: You are responsible for knowing and keeping track of what medications you take and those you need refilled. The day before your appointment: write a list of all prescriptions that need to be refilled. The day of the appointment: give the list to the admitting nurse. Prescriptions will be written only during appointments. No prescriptions will be written on procedure days. If you forget a medication: it will not be "Called in", "Faxed", or "electronically sent". You will need to get another appointment to get these prescribed. No early refills. Do not call asking to have your prescription filled  early. 4. Prescription Accuracy: You are responsible for carefully inspecting your prescriptions before leaving our office. Have the discharge nurse carefully go over each prescription with you, before taking them home. Make sure that your name is accurately spelled, that your address is correct. Check the name and dose of your medication to make sure it is accurate. Check the number of pills, and the written instructions to make sure they are clear and accurate. Make sure that you are given enough medication to last until your next medication refill appointment. 5. Taking Medication: Take medication as prescribed. When it comes to controlled substances, taking less pills or less frequently than prescribed is permitted and encouraged. Never take more pills than instructed. Never take medication more frequently than prescribed.  6. Inform other Doctors: Always inform, all of your healthcare providers, of all the medications you take. 7. Pain Medication from other Providers: You are not allowed to accept any additional pain medication from any other Doctor or Healthcare provider. There are two exceptions to this rule. (see below) In the event that you require additional pain medication, you are responsible for notifying us, as stated below. 8. Medication Agreement: You are responsible for carefully reading and following our Medication Agreement. This must be signed before receiving any prescriptions from our practice. Safely store a copy of your signed Agreement. Violations to the Agreement will result in no further prescriptions. (Additional copies of our Medication Agreement are available upon request.) 9. Laws, Rules, & Regulations: All patients are expected to follow all Federal and State Laws, Statutes, Rules, & Regulations. Ignorance of the Laws does not constitute a valid excuse. The use of any illegal substances is prohibited. 10. Adopted CDC guidelines & recommendations: Target dosing levels will be  at or below 60 MME/day. Use of benzodiazepines** is not recommended.  Exceptions: There are only two exceptions to the rule of not   receiving pain medications from other Healthcare Providers. 1. Exception #1 (Emergencies): In the event of an emergency (i.e.: accident requiring emergency care), you are allowed to receive additional pain medication. However, you are responsible for: As soon as you are able, call our office [phone], at any time of the day or night, and leave a message stating your name, the date and nature of the emergency, and the name and dose of the medication prescribed. In the event that your call is answered by a member of our staff, make sure to document and save the date, time, and the name of the person that took your information.  2. Exception #2 (Planned Surgery): In the event that you are scheduled by another doctor or dentist to have any type of surgery or procedure, you are allowed (for a period no longer than 30 days), to receive additional pain medication, for the acute post-op pain. However, in this case, you are responsible for picking up a copy of our "Post-op Pain Management for Surgeons" handout, and giving it to your surgeon or dentist. This document is available at our office, and does not require an appointment to obtain it. Simply go to our office during business hours (Monday-Thursday from 8:00 AM to 4:00 PM) (Friday 8:00 AM to 12:00 Noon) or if you have a scheduled appointment with us, prior to your surgery, and ask for it by name. In addition, you will need to provide us with your name, name of your surgeon, type of surgery, and date of procedure or surgery.  *Opioid medications include: morphine, codeine, oxycodone, oxymorphone, hydrocodone, hydromorphone, meperidine, tramadol, tapentadol, buprenorphine, fentanyl, methadone. **Benzodiazepine medications include: diazepam (Valium), alprazolam (Xanax), clonazepam (Klonopine), lorazepam (Ativan), clorazepate  (Tranxene), chlordiazepoxide (Librium), estazolam (Prosom), oxazepam (Serax), temazepam (Restoril), triazolam (Halcion) (Last updated: 10/28/2017) ____________________________________________________________________________________________   ____________________________________________________________________________________________  Medication Recommendations and Reminders  Applies to: All patients receiving prescriptions (written and/or electronic).  Medication Rules & Regulations: These rules and regulations exist for your safety and that of others. They are not flexible and neither are we. Dismissing or ignoring them will be considered "non-compliance" with medication therapy, resulting in complete and irreversible termination of such therapy. (See document titled "Medication Rules" for more details.) In all conscience, because of safety reasons, we cannot continue providing a therapy where the patient does not follow instructions.  Pharmacy of record:   Definition: This is the pharmacy where your electronic prescriptions will be sent.   We do not endorse any particular pharmacy.  You are not restricted in your choice of pharmacy.  The pharmacy listed in the electronic medical record should be the one where you want electronic prescriptions to be sent.  If you choose to change pharmacy, simply notify our nursing staff of your choice of new pharmacy.  Recommendations:  Keep all of your pain medications in a safe place, under lock and key, even if you live alone.   After you fill your prescription, take 1 week's worth of pills and put them away in a safe place. You should keep a separate, properly labeled bottle for this purpose. The remainder should be kept in the original bottle. Use this as your primary supply, until it runs out. Once it's gone, then you know that you have 1 week's worth of medicine, and it is time to come in for a prescription refill. If you do this correctly, it  is unlikely that you will ever run out of medicine.  To make sure that the above recommendation works,   it is very important that you make sure your medication refill appointments are scheduled at least 1 week before you run out of medicine. To do this in an effective manner, make sure that you do not leave the office without scheduling your next medication management appointment. Always ask the nursing staff to show you in your prescription , when your medication will be running out. Then arrange for the receptionist to get you a return appointment, at least 7 days before you run out of medicine. Do not wait until you have 1 or 2 pills left, to come in. This is very poor planning and does not take into consideration that we may need to cancel appointments due to bad weather, sickness, or emergencies affecting our staff.  "Partial Fill": If for any reason your pharmacy does not have enough pills/tablets to completely fill or refill your prescription, do not allow for a "partial fill". You will need a separate prescription to fill the remaining amount, which we will not provide. If the reason for the partial fill is your insurance, you will need to talk to the pharmacist about payment alternatives for the remaining tablets, but again, do not accept a partial fill.  Prescription refills and/or changes in medication(s):   Prescription refills, and/or changes in dose or medication, will be conducted only during scheduled medication management appointments. (Applies to both, written and electronic prescriptions.)  No refills on procedure days. No medication will be changed or started on procedure days. No changes, adjustments, and/or refills will be conducted on a procedure day. Doing so will interfere with the diagnostic portion of the procedure.  No phone refills. No medications will be "called into the pharmacy".  No Fax refills.  No weekend refills.  No Holliday refills.  No after hours  refills.  Remember:  Business hours are:  Monday to Thursday 8:00 AM to 4:00 PM Provider's Schedule: Finlay Mills, MD - Appointments are:  Medication management: Monday and Wednesday 8:00 AM to 4:00 PM Procedure day: Tuesday and Thursday 7:30 AM to 4:00 PM Bilal Lateef, MD - Appointments are:  Medication management: Tuesday and Thursday 8:00 AM to 4:00 PM Procedure day: Monday and Wednesday 7:30 AM to 4:00 PM (Last update: 10/28/2017) ____________________________________________________________________________________________    

## 2019-05-27 ENCOUNTER — Ambulatory Visit: Payer: Medicare HMO

## 2019-05-29 ENCOUNTER — Ambulatory Visit: Payer: Medicare HMO

## 2019-05-29 ENCOUNTER — Other Ambulatory Visit: Payer: Self-pay

## 2019-05-29 ENCOUNTER — Ambulatory Visit (INDEPENDENT_AMBULATORY_CARE_PROVIDER_SITE_OTHER): Payer: Medicare HMO

## 2019-05-29 DIAGNOSIS — Z23 Encounter for immunization: Secondary | ICD-10-CM | POA: Diagnosis not present

## 2019-05-31 ENCOUNTER — Encounter: Payer: Medicare HMO | Admitting: Pain Medicine

## 2019-06-05 ENCOUNTER — Other Ambulatory Visit: Payer: Self-pay | Admitting: Family Medicine

## 2019-06-11 ENCOUNTER — Other Ambulatory Visit: Payer: Self-pay | Admitting: Family Medicine

## 2019-06-11 DIAGNOSIS — F419 Anxiety disorder, unspecified: Secondary | ICD-10-CM

## 2019-06-12 NOTE — Telephone Encounter (Signed)
Requested medication (s) are due for refill today: yes  Requested medication (s) are on the active medication list: yes  Last refill:  05/10/2019  Future visit scheduled: yes  Notes to clinic:  Refill cannot be delegated    Requested Prescriptions  Pending Prescriptions Disp Refills   ALPRAZolam (XANAX) 0.25 MG tablet [Pharmacy Med Name: ALPRAZOLAM 0.25 MG TABLET] 30 tablet 2    Sig: TAKE 1 TABLET BY MOUTH EVERY DAY AS NEEDED FOR ANXIETY     Not Delegated - Psychiatry:  Anxiolytics/Hypnotics Failed - 06/11/2019  8:36 PM      Failed - This refill cannot be delegated      Failed - Urine Drug Screen completed in last 360 days.      Passed - Valid encounter within last 6 months    Recent Outpatient Visits          5 months ago Acute sinusitis, recurrence not specified, unspecified location   [facility], Jeannette How, MD   6 months ago Acute non-recurrent maxillary sinusitis   [place], Castor, DO   6 months ago Nausea vomiting and diarrhea   [facility] Roof [place], [place]   7 months ago [place], [place], [place]   9 months ago Hypothyroidism, unspecified type   [facility] [place], Jeannette How, MD      Future Appointments            In 4 days Volney American, [place], [place]

## 2019-06-16 ENCOUNTER — Encounter: Payer: Self-pay | Admitting: Family Medicine

## 2019-06-16 ENCOUNTER — Ambulatory Visit (INDEPENDENT_AMBULATORY_CARE_PROVIDER_SITE_OTHER): Payer: Medicare HMO | Admitting: Family Medicine

## 2019-06-16 ENCOUNTER — Other Ambulatory Visit: Payer: Self-pay

## 2019-06-16 VITALS — BP 124/83 | HR 66 | Temp 98.7°F | Ht 60.0 in | Wt 219.0 lb

## 2019-06-16 DIAGNOSIS — Z0000 Encounter for general adult medical examination without abnormal findings: Secondary | ICD-10-CM

## 2019-06-16 DIAGNOSIS — F5101 Primary insomnia: Secondary | ICD-10-CM | POA: Diagnosis not present

## 2019-06-16 DIAGNOSIS — E1340 Other specified diabetes mellitus with diabetic neuropathy, unspecified: Secondary | ICD-10-CM | POA: Diagnosis not present

## 2019-06-16 DIAGNOSIS — F3342 Major depressive disorder, recurrent, in full remission: Secondary | ICD-10-CM

## 2019-06-16 DIAGNOSIS — F419 Anxiety disorder, unspecified: Secondary | ICD-10-CM

## 2019-06-16 DIAGNOSIS — E039 Hypothyroidism, unspecified: Secondary | ICD-10-CM | POA: Diagnosis not present

## 2019-06-16 DIAGNOSIS — I10 Essential (primary) hypertension: Secondary | ICD-10-CM | POA: Diagnosis not present

## 2019-06-16 DIAGNOSIS — J3089 Other allergic rhinitis: Secondary | ICD-10-CM | POA: Diagnosis not present

## 2019-06-16 DIAGNOSIS — E7800 Pure hypercholesterolemia, unspecified: Secondary | ICD-10-CM | POA: Diagnosis not present

## 2019-06-16 DIAGNOSIS — E119 Type 2 diabetes mellitus without complications: Secondary | ICD-10-CM

## 2019-06-16 LAB — UA/M W/RFLX CULTURE, ROUTINE
Bilirubin, UA: NEGATIVE
Glucose, UA: NEGATIVE
Ketones, UA: NEGATIVE
Leukocytes,UA: NEGATIVE
Nitrite, UA: NEGATIVE
Protein,UA: NEGATIVE
Specific Gravity, UA: 1.02 (ref 1.005–1.030)
Urobilinogen, Ur: 0.2 mg/dL (ref 0.2–1.0)
pH, UA: 5 (ref 5.0–7.5)

## 2019-06-16 LAB — MICROSCOPIC EXAMINATION

## 2019-06-16 MED ORDER — ALPRAZOLAM 0.25 MG PO TABS: 0.2500 mg | ORAL_TABLET | Freq: Every day | ORAL | 1 refills | Status: DC | PRN

## 2019-06-16 NOTE — Progress Notes (Signed)
BP 124/83   Pulse 66   Temp 98.7 F (37.1 C) (Oral)   Ht 5' (1.524 m)   Wt 219 lb (99.3 kg)   SpO2 95%   BMI 42.77 kg/m    Subjective:    Patient ID: Cynthia Jones, female    DOB: [birth year], 78 y.o.   MRN: [id number]  HPI: Cynthia Jones is a 78 y.o. female presenting on 06/16/2019 for comprehensive medical examination. Current medical complaints include:see below  Left knee anterior pain and swelling for several months. Has known OA in her knees, had injections already which didn't help. On oxycodone through pain management, also taking some tylenol as needed additionally. Had a knee replacement on the right.   On cymbalta and abilify, and taking xanax prn for severe spells. Recently the past few months has had some deaths in the family so had been taking them every day for a while now. Feels like she's doing a bit better at this point and wanting to reduce back to to 1 script lasting at least 3 months. Denies SI/HI.   HTN - home BPs around 120s/80s when checked. Compliant with her regimen and not having any side effects, Cp, SOB, HAs, dizziness.   Hypothyroid - on synthroid without issue. Asymptomatic currently  HLD - taking lipitor faithfully without side effects. Denies claudication, myalgias.   DM - diet controlled. No low blood sugar spells, polyuria, polydipsia.   She currently lives with: Menopausal Symptoms: no  Depression Screen done today and results listed below:  Depression screen [facility] 2/9 06/16/2019 04/12/2019 11/01/2018 10/18/2018 09/20/2018  Decreased Interest 1 0 0 0 0  Down, Depressed, Hopeless 1 0 0 0 0  PHQ - 2 Score 2 0 0 0 0  Altered sleeping 0 - - - -  Tired, decreased energy 1 - - - -  Change in appetite 1 - - - -  Feeling bad or failure about yourself  0 - - - -  Trouble concentrating 0 - - - -  Moving slowly or fidgety/restless 0 - - - -  Suicidal thoughts 0 - - - -  PHQ-9 Score 4 - - - -  Difficult doing work/chores Not difficult at all -  - - -  Some recent data might be hidden    The patient does not have a history of falls. I did complete a risk assessment for falls. A plan of care for falls was documented.   Past Medical History:  Past Medical History:  Diagnosis Date  . Acute anxiety 03/06/2015  . Allergy   . Anxiety   . Degenerative joint disease of knee   . Depression   . Fibromyalgia   . Obesity   . Osteopenia   . RAD (reactive airway disease)     Surgical History:  Past Surgical History:  Procedure Laterality Date  . ABDOMINAL HYSTERECTOMY    . APPENDECTOMY    . BREAST CYST ASPIRATION Right 04/18/2013   neg  . CHOLECYSTECTOMY    . EYE SURGERY    . JOINT REPLACEMENT  2014   right knee  . PAROTIDECTOMY Left   . TONSILLECTOMY      Medications:  Current Outpatient Medications on File Prior to Visit  Medication Sig  . ARIPiprazole (ABILIFY) 2 MG tablet TAKE 1 TABLET BY MOUTH EVERY DAY  . carvedilol (COREG) 6.25 MG tablet Take 1 tablet (6.25 mg total) by mouth 2 (two) times daily with a meal.  . diclofenac sodium (VOLTAREN) 1 %  GEL Apply 2 g topically 4 (four) times daily.  . DULoxetine (CYMBALTA) 60 MG capsule TAKE 1 CAPSULE BY MOUTH EVERY DAY  . esomeprazole (NEXIUM) 40 MG capsule Take 40 mg by mouth daily at 12 noon.  [name]. levothyroxine (SYNTHROID) 50 MCG tablet TAKE 1 TABLET BY MOUTH EVERY DAY  . losartan (COZAAR) 100 MG tablet Take 1 tablet (100 mg total) by mouth daily.  [name]. nystatin (MYCOSTATIN/NYSTOP) powder APPLY TO AFFECTED AREA 3 TIMES A DAY  . oxyCODONE (OXY IR/ROXICODONE) 5 MG immediate release tablet Take 1 tablet (5 mg total) by mouth every 8 (eight) hours as needed for severe pain. Must last 30 days  . atorvastatin (LIPITOR) 10 MG tablet   . glucose blood (IGLUCOSE TEST STRIPS) test strip Use as instructed - freestyle insulin x strip (Patient not taking: Reported on 06/16/2019)  . [START ON 07/09/2019] oxyCODONE (OXY IR/ROXICODONE) 5 MG immediate release tablet Take 1 tablet (5 mg total) by  mouth every 8 (eight) hours as needed for severe pain. Must last 30 days (Patient not taking: Reported on 06/16/2019)  . [START ON 08/08/2019] oxyCODONE (OXY IR/ROXICODONE) 5 MG immediate release tablet Take 1 tablet (5 mg total) by mouth every 8 (eight) hours as needed for severe pain. Must last 30 days (Patient not taking: Reported on 06/16/2019)   No current facility-administered medications on file prior to visit.     Allergies:  Allergies  Allergen Reactions  . Nsaids   . Erythromycin Nausea Only    Social History:  Social History   Socioeconomic History  . Marital status: Widowed    Spouse name: Not on file  . Number of children: Not on file  . Years of education: Not on file  . Highest education level: 10th grade  Occupational History  . Not on file  Social Needs  . Financial resource strain: Not very hard  . Food insecurity    Worry: Never true    Inability: Never true  . Transportation needs    Medical: No    Non-medical: No  Tobacco Use  . Smoking status: Never Smoker  . Smokeless tobacco: Never Used  Substance and Sexual Activity  . Alcohol use: No    Alcohol/week: 0.0 standard drinks  . Drug use: No  . Sexual activity: Not on file  Lifestyle  . Physical activity    Days per week: 0 days    Minutes per session: 0 min  . Stress: Not at all  Relationships  . Social connections    Talks on phone: More than three times a week    Gets together: More than three times a week    Attends religious service: More than 4 times per year    Active member of club or organization: No    Attends meetings of clubs or organizations: Never    Relationship status: Widowed  . Intimate partner violence    Fear of current or ex partner: No    Emotionally abused: No    Physically abused: No    Forced sexual activity: No  Other Topics Concern  . Not on file  Social History Narrative  . Not on file   Social History   Tobacco Use  Smoking Status Never Smoker   Smokeless Tobacco Never Used   Social History   Substance and Sexual Activity  Alcohol Use No  . Alcohol/week: 0.0 standard drinks    Family History:  Family History  Problem Relation Age of Onset  . Heart disease  Mother   . Stroke Mother   . Diabetes Mother   . Alzheimer's disease Father   . Parkinson's disease Father   . Cancer Sister     Past medical history, surgical history, medications, allergies, family history and social history reviewed with patient today and changes made to appropriate areas of the chart.   Review of Systems - General ROS: negative Psychological ROS: positive for - anxiety Ophthalmic ROS: negative ENT ROS: negative Allergy and Immunology ROS: negative Hematological and Lymphatic ROS: negative Endocrine ROS: negative Breast ROS: negative for breast lumps Respiratory ROS: no cough, shortness of breath, or wheezing Cardiovascular ROS: no chest pain or dyspnea on exertion Gastrointestinal ROS: no abdominal pain, change in bowel habits, or black or bloody stools Genito-Urinary ROS: no dysuria, trouble voiding, or hematuria Musculoskeletal ROS: positive for - joint pain Neurological ROS: no TIA or stroke symptoms Dermatological ROS: negative All other ROS negative except what is listed above and in the HPI.      Objective:    BP 124/83   Pulse 66   Temp 98.7 F (37.1 C) (Oral)   Ht 5' (1.524 m)   Wt 219 lb (99.3 kg)   SpO2 95%   BMI 42.77 kg/m   Wt Readings from Last 3 Encounters:  06/16/19 219 lb (99.3 kg)  04/12/19 220 lb (99.8 kg)  12/14/18 210 lb (95.3 kg)    Physical Exam Vitals signs and nursing note reviewed.  Constitutional:      General: She is not in acute distress.    Appearance: She is well-developed.  HENT:     Head: Atraumatic.     Right Ear: External ear normal.     Left Ear: External ear normal.     Nose: Nose normal.     Mouth/Throat:     Pharynx: No oropharyngeal exudate.  Eyes:     General: No scleral  icterus.    Conjunctiva/sclera: Conjunctivae normal.     Pupils: Pupils are equal, round, and reactive to light.  Neck:     Musculoskeletal: Normal range of motion and neck supple.     Thyroid: No thyromegaly.  Cardiovascular:     Rate and Rhythm: Normal rate and regular rhythm.     Heart sounds: Normal heart sounds.  Pulmonary:     Effort: Pulmonary effort is normal. No respiratory distress.     Breath sounds: Normal breath sounds.  Chest:     Comments: Declines breast exam Abdominal:     General: Bowel sounds are normal.     Palpations: Abdomen is soft. There is no mass.     Tenderness: There is no abdominal tenderness.  Genitourinary:    Comments: Declines GU exam Musculoskeletal: Normal range of motion.        General: Swelling (left knee swollen and ttp) and tenderness present.  Lymphadenopathy:     Cervical: No cervical adenopathy.  Skin:    General: Skin is warm and dry.     Findings: No erythema or rash.  Neurological:     Mental Status: She is alert and oriented to person, place, and time.     Cranial Nerves: No cranial nerve deficit.  Psychiatric:        Behavior: Behavior normal.    Diabetic Foot Exam - Simple   Simple Foot Form Diabetic Foot exam was performed with the following findings: Yes 06/16/2019  1:32 PM  Visual Inspection No deformities, no ulcerations, no other skin breakdown bilaterally: Yes Sensation Testing Intact to  touch and monofilament testing bilaterally: Yes Pulse Check Posterior Tibialis and Dorsalis pulse intact bilaterally: Yes Comments     Results for orders placed or performed in visit on 06/16/19  Microscopic Examination   URINE  Result Value Ref Range   RBC 0-2 0 - 2 /hpf   Epithelial Cells (non renal) 0-10 0 - 10 /hpf  CBC with Differential/Platelet  Result Value Ref Range   WBC 8.2 3.4 - 10.8 x10E3/uL   RBC 4.42 3.77 - 5.28 x10E6/uL   Hemoglobin 13.6 11.1 - 15.9 g/dL   Hematocrit 33.3 54.5 - 46.6 %   MCV 91 79 - 97 fL    MCH 30.8 26.6 - 33.0 pg   MCHC 33.8 31.5 - 35.7 g/dL   RDW 62.5 63.8 - 93.7 %   Platelets 347 150 - 450 x10E3/uL   Neutrophils 61 Not Estab. %   Lymphs 26 Not Estab. %   Monocytes 10 Not Estab. %   Eos 2 Not Estab. %   Basos 1 Not Estab. %   Neutrophils Absolute 5.0 1.4 - 7.0 x10E3/uL   Lymphocytes Absolute 2.1 0.7 - 3.1 x10E3/uL   Monocytes Absolute 0.8 0.1 - 0.9 x10E3/uL   EOS (ABSOLUTE) 0.1 0.0 - 0.4 x10E3/uL   Basophils Absolute 0.1 0.0 - 0.2 x10E3/uL   Immature Granulocytes 0 Not Estab. %   Immature Grans (Abs) 0.0 0.0 - 0.1 x10E3/uL  Comprehensive metabolic panel  Result Value Ref Range   Glucose 124 (H) 65 - 99 mg/dL   BUN 13 8 - 27 mg/dL   Creatinine, Ser 3.42 0.57 - 1.00 mg/dL   GFR calc non Af Amer 76 >59 mL/min/1.73   GFR calc Af Amer 88 >59 mL/min/1.73   BUN/Creatinine Ratio 17 12 - 28   Sodium 140 134 - 144 mmol/L   Potassium 4.7 3.5 - 5.2 mmol/L   Chloride 103 96 - 106 mmol/L   CO2 23 20 - 29 mmol/L   Calcium 10.2 8.7 - 10.3 mg/dL   Total Protein 7.3 6.0 - 8.5 g/dL   Albumin 4.4 3.7 - 4.7 g/dL   Globulin, Total 2.9 1.5 - 4.5 g/dL   Albumin/Globulin Ratio 1.5 1.2 - 2.2   Bilirubin Total 0.3 0.0 - 1.2 mg/dL   Alkaline Phosphatase 57 39 - 117 IU/L   AST 13 0 - 40 IU/L   ALT 12 0 - 32 IU/L  Lipid Panel w/o Chol/HDL Ratio  Result Value Ref Range   Cholesterol, Total 210 (H) 100 - 199 mg/dL   Triglycerides 876 (H) 0 - 149 mg/dL   HDL 70 >81 mg/dL   VLDL Cholesterol Cal 28 5 - 40 mg/dL   LDL Chol Calc (NIH) 157 (H) 0 - 99 mg/dL  TSH  Result Value Ref Range   TSH 2.070 0.450 - 4.500 uIU/mL  UA/M w/rflx Culture, Routine   Specimen: Urine   URINE  Result Value Ref Range   Specific Gravity, UA 1.020 1.005 - 1.030   pH, UA 5.0 5.0 - 7.5   Color, UA Yellow Yellow   Appearance Ur Clear Clear   Leukocytes,UA Negative Negative   Protein,UA Negative Negative/Trace   Glucose, UA Negative Negative   Ketones, UA Negative Negative   RBC, UA Trace (A) Negative    Bilirubin, UA Negative Negative   Urobilinogen, Ur 0.2 0.2 - 1.0 mg/dL   Nitrite, UA Negative Negative   Microscopic Examination See below:   HgB A1c  Result Value Ref Range   Hgb A1c  MFr Bld 6.3 (H) 4.8 - 5.6 %   Est. average glucose Bld gHb Est-mCnc 134 mg/dL      Assessment & Plan:   Problem List Items Addressed This Visit      Cardiovascular and Mediastinum   Essential hypertension - Primary    BPs stable and WNL, continue current regimen       Relevant Medications   atorvastatin (LIPITOR) 10 MG tablet   Other Relevant Orders   CBC with Differential/Platelet (Completed)   Comprehensive metabolic panel (Completed)   UA/M w/rflx Culture, Routine (Completed)     Respiratory   Seasonal allergic rhinitis    Stable and under good control, continue current regimen        Endocrine   Neuropathy due to secondary diabetes (HCC) (Chronic)    Stable, well controlled. Continue current regimen      Relevant Medications   atorvastatin (LIPITOR) 10 MG tablet   Hypothyroid    Recheck TSH, adjust as needed. Continue current regimen      Relevant Orders   TSH (Completed)     Other   Anxiety    Chronic, exacerbated by several recent deaths in her close family and friends. Will work hard on reducing xanax usage as she was up to daily use for a few months there. One script should last at least 3 months. She was previously able to make one script last 6-12 months. Continue remainder of regimen      Relevant Medications   ALPRAZolam (XANAX) 0.25 MG tablet   Depression    Exacerbated due to recent grief from several losses, but feeling some better now. Continue current regimen      Relevant Medications   ALPRAZolam (XANAX) 0.25 MG tablet   Insomnia    Stable and fairly well controlled, continue current regimen      Hypercholesteremia    Recheck lipids, adjust dose as needed. COntinue working on lifestyle modifications      Relevant Medications   atorvastatin (LIPITOR)  10 MG tablet   Other Relevant Orders   Lipid Panel w/o Chol/HDL Ratio (Completed)    Other Visit Diagnoses    Annual physical exam       Diabetes mellitus without complication (HCC)       Diet controlled. Recheck A1C, adjust as needed   Relevant Medications   atorvastatin (LIPITOR) 10 MG tablet   Other Relevant Orders   HgB A1c (Completed)       Follow up plan: Return in about 6 months (around 12/15/2019) for 6 month f/u.   LABORATORY TESTING:  - Pap smear: not applicable  IMMUNIZATIONS:   - Tdap: Tetanus vaccination status reviewed: last tetanus booster within 10 years. - Influenza: Up to date - Pneumovax: Up to date - Prevnar: Up to date - HPV: Not applicable - Zostavax vaccine: Refused  SCREENING: -Mammogram: ordered  - Colonoscopy: Up to date  - Bone Density: Up to date   PATIENT COUNSELING:   Advised to take 1 mg of folate supplement per day if capable of pregnancy.   Sexuality: Discussed sexually transmitted diseases, partner selection, use of condoms, avoidance of unintended pregnancy  and contraceptive alternatives.   Advised to avoid cigarette smoking.  I discussed with the patient that most people either abstain from alcohol or drink within safe limits (<=14/week and <=4 drinks/occasion for males, <=7/weeks and <= 3 drinks/occasion for females) and that the risk for alcohol disorders and other health effects rises proportionally with the number of drinks per  week and how often a drinker exceeds daily limits.  Discussed cessation/primary prevention of drug use and availability of treatment for abuse.   Diet: Encouraged to adjust caloric intake to maintain  or achieve ideal body weight, to reduce intake of dietary saturated fat and total fat, to limit sodium intake by avoiding high sodium foods and not adding table salt, and to maintain adequate dietary potassium and calcium preferably from fresh fruits, vegetables, and low-fat dairy products.    stressed the  importance of regular exercise  Injury prevention: Discussed safety belts, safety helmets, smoke detector, smoking near bedding or upholstery.   Dental health: Discussed importance of regular tooth brushing, flossing, and dental visits.    NEXT PREVENTATIVE PHYSICAL DUE IN 1 YEAR. Return in about 6 months (around 12/15/2019) for 6 month f/u.

## 2019-06-17 LAB — LIPID PANEL W/O CHOL/HDL RATIO
Cholesterol, Total: 210 mg/dL — ABNORMAL HIGH (ref 100–199)
HDL: 70 mg/dL (ref >39)
LDL Chol Calc (NIH): 112 mg/dL — ABNORMAL HIGH (ref 0–99)
Triglycerides: 163 mg/dL — ABNORMAL HIGH (ref 0–149)
VLDL Cholesterol Cal: 28 mg/dL (ref 5–40)

## 2019-06-17 LAB — CBC WITH DIFFERENTIAL/PLATELET
Basophils Absolute: 0.1 10*3/uL (ref 0.0–0.2)
Basos: 1 %
EOS (ABSOLUTE): 0.1 10*3/uL (ref 0.0–0.4)
Eos: 2 %
Hematocrit: 40.2 % (ref 34.0–46.6)
Hemoglobin: 13.6 g/dL (ref 11.1–15.9)
Immature Grans (Abs): 0 10*3/uL (ref 0.0–0.1)
Immature Granulocytes: 0 %
Lymphocytes Absolute: 2.1 10*3/uL (ref 0.7–3.1)
Lymphs: 26 %
MCH: 30.8 pg (ref 26.6–33.0)
MCHC: 33.8 g/dL (ref 31.5–35.7)
MCV: 91 fL (ref 79–97)
Monocytes Absolute: 0.8 10*3/uL (ref 0.1–0.9)
Monocytes: 10 %
Neutrophils Absolute: 5 10*3/uL (ref 1.4–7.0)
Neutrophils: 61 %
Platelets: 347 10*3/uL (ref 150–450)
RBC: 4.42 x10E6/uL (ref 3.77–5.28)
RDW: 12.8 % (ref 11.7–15.4)
WBC: 8.2 10*3/uL (ref 3.4–10.8)

## 2019-06-17 LAB — COMPREHENSIVE METABOLIC PANEL
ALT: 12 IU/L (ref 0–32)
AST: 13 IU/L (ref 0–40)
Albumin/Globulin Ratio: 1.5 (ref 1.2–2.2)
Albumin: 4.4 g/dL (ref 3.7–4.7)
Alkaline Phosphatase: 57 IU/L (ref 39–117)
BUN/Creatinine Ratio: 17 (ref 12–28)
BUN: 13 mg/dL (ref 8–27)
Bilirubin Total: 0.3 mg/dL (ref 0.0–1.2)
CO2: 23 mmol/L (ref 20–29)
Calcium: 10.2 mg/dL (ref 8.7–10.3)
Chloride: 103 mmol/L (ref 96–106)
Creatinine, Ser: 0.76 mg/dL (ref 0.57–1.00)
GFR calc Af Amer: 88 mL/min/{1.73_m2} (ref >59)
GFR calc non Af Amer: 76 mL/min/{1.73_m2} (ref >59)
Globulin, Total: 2.9 g/dL (ref 1.5–4.5)
Glucose: 124 mg/dL — ABNORMAL HIGH (ref 65–99)
Potassium: 4.7 mmol/L (ref 3.5–5.2)
Sodium: 140 mmol/L (ref 134–144)
Total Protein: 7.3 g/dL (ref 6.0–8.5)

## 2019-06-17 LAB — HEMOGLOBIN A1C
Est. average glucose Bld gHb Est-mCnc: 134 mg/dL
Hgb A1c MFr Bld: 6.3 % — ABNORMAL HIGH (ref 4.8–5.6)

## 2019-06-17 LAB — TSH: TSH: 2.07 u[IU]/mL (ref 0.450–4.500)

## 2019-06-21 NOTE — Assessment & Plan Note (Signed)
Recheck TSH, adjust as needed. Continue current regimen 

## 2019-06-21 NOTE — Assessment & Plan Note (Signed)
Stable, well controlled. Continue current regimen 

## 2019-06-21 NOTE — Assessment & Plan Note (Signed)
Recheck lipids, adjust dose as needed. COntinue working on lifestyle modifications

## 2019-06-21 NOTE — Assessment & Plan Note (Signed)
BPs stable and WNL, continue current regimen 

## 2019-06-21 NOTE — Assessment & Plan Note (Signed)
Stable and under good control, continue current regimen 

## 2019-06-21 NOTE — Assessment & Plan Note (Signed)
Stable and fairly well controlled, continue current regimen 

## 2019-06-21 NOTE — Assessment & Plan Note (Signed)
Chronic, exacerbated by several recent deaths in her close family and friends. Will work hard on reducing xanax usage as she was up to daily use for a few months there. One script should last at least 3 months. She was previously able to make one script last 6-12 months. Continue remainder of regimen

## 2019-06-21 NOTE — Assessment & Plan Note (Signed)
Exacerbated due to recent grief from several losses, but feeling some better now. Continue current regimen

## 2019-07-03 ENCOUNTER — Telehealth: Payer: Self-pay | Admitting: Pain Medicine

## 2019-07-03 NOTE — Telephone Encounter (Signed)
Patient called to talk with [name] about her meds that are not helping her pain at this time. I set her up for appt. On 07-05-19

## 2019-07-04 ENCOUNTER — Encounter: Payer: Self-pay | Admitting: Pain Medicine

## 2019-07-04 NOTE — Progress Notes (Signed)
Pain Management Virtual Encounter Note - Virtual Visit via Telephone Telehealth (real-time audio visits between healthcare provider and patient).   Patient's Phone No. & Preferred Pharmacy:  [phone] (home); [phone] (mobile); (Preferred) [phone] [email or web address]  [facility] [address] - [place], [place] - [address] [place]  [place] [id number] Phone: [phone] Fax: [phone]    Pre-screening note:  Our staff contacted Cynthia Jones and offered her an "in person", "face-to-face" appointment versus a telephone encounter. She indicated preferring the telephone encounter, at this time.   Reason for Virtual Visit: COVID-19*  Social distancing based on CDC and AMA recommendations.   I contacted Cynthia GowdaShirley Jones Stream on 07/05/2019 via telephone.      I clearly identified myself as Cynthia DoneFrancisco A Azarah Dacy, MD. I verified that I was speaking with the correct person using two identifiers (Name: Cynthia Jones Jones, and date of birth: 01/03/1941).  Advanced Informed Consent I sought verbal advanced consent from Cynthia Jones Milos for virtual visit interactions. I informed Cynthia Jones of possible security and privacy concerns, risks, and limitations associated with providing "not-in-person" medical evaluation and management services. I also informed Cynthia Jones of the availability of "in-person" appointments. Finally, I informed her that there would be a charge for the virtual visit and that she could be  personally, fully or partially, financially responsible for it. Cynthia Jones expressed understanding and agreed to proceed.   Historic Elements   Ms. Cynthia Jones is a 78 y.o. year old, female patient evaluated today after her last encounter by our practice on 07/03/2019. Cynthia Jones  has a past medical history of Acute anxiety (03/06/2015), Allergy, Anxiety, Degenerative joint disease of knee, Depression, Fibromyalgia, Obesity, Osteopenia, and RAD (reactive  airway disease). She also  has a past surgical history that includes Parotidectomy (Left); Appendectomy; Cholecystectomy; Abdominal hysterectomy; Tonsillectomy; Joint replacement (2014); Eye surgery; and Breast cyst aspiration (Right, 04/18/2013). Cynthia Jones has a current medication list which includes the following prescription(s): alprazolam, aripiprazole, atorvastatin, carvedilol, diclofenac sodium, duloxetine, esomeprazole, levothyroxine, losartan, nystatin, oxycodone, oxycodone, oxycodone, and gabapentin. She  reports that she has never smoked. She has never used smokeless tobacco. She reports that she does not drink alcohol or use drugs. Cynthia Jones is allergic to nsaids and erythromycin.   HPI  Today, she is being contacted for medication management.  The patient requested this appointment to discuss a possible medication change.  The patient indicated that she has noticed that when she takes her oxycodone it does not work as well as it used to.  She confessed that she occasionally will take 2 at a time and that seems to help.  However, I do not want to get into that roller coaster and because of that today we addressed the issue of "Drug Holidays".  Today we went over what is bothering her the most and it turns out to be the left knee.  Reviewing her treatments, I noticed that her last Hyalgan knee injection was around March of this year.  It is been more than 6 months and therefore I have offered to have this repeated.  We went over the results of her x-rays and I did mention to her that should this continue to give her problems, we may need to consider doing an MRI and perhaps getting the opinion of an orthopedic surgeon for possible replacement.  Since the patient has a history of fibromyalgia and her pain complaints are generalized, we talked about the possibility of using a membrane stabilizer.  Her daughter  was with her during the call and neither 1 could remember the last time that she tried  any gabapentin or pregabalin.  I did research on the patient's historic medications and I could not find any and therefore today we have decided to go ahead and do a gabapentin trial starting with 100 mg at bedtime and titrating that as needed.  She also has diabetic neuropathy and therefore I think that this would be very beneficial for her as well.  I rather have her on a membrane stabilizer than an opioid.  Pharmacotherapy Assessment  Analgesic: Oxycodone IR 5 mg, 1 tab PO q8 hrs (15 mg/day of oxycodone) (enough to last until 10/07/2019) MME/day:15mg /day.   Monitoring: Pharmacotherapy: No side-effects or adverse reactions reported. [place] PMP: PDMP reviewed during this encounter.       Compliance: No problems identified. Effectiveness: Clinically acceptable. Plan: Refer to "POC".  UDS:  Summary  Date Value Ref Range Status  02/09/2018 FINAL  Final    Comment:    ==================================================================== TOXASSURE SELECT 13 (MW) ==================================================================== Test                             Result       Flag       Units Drug Present and Declared for Prescription Verification   Oxycodone                      1276         EXPECTED   ng/mg creat   Noroxycodone                   2447         EXPECTED   ng/mg creat    Sources of oxycodone include scheduled prescription medications.    Noroxycodone is an expected metabolite of oxycodone. Drug Absent but Declared for Prescription Verification   Alprazolam                     Not Detected UNEXPECTED ng/mg creat ==================================================================== Test                      Result    Flag   Units      Ref Range   Creatinine              109              mg/dL      >=20 ==================================================================== Declared Medications:  The flagging and interpretation on this report are based on the  following declared  medications.  Unexpected results may arise from  inaccuracies in the declared medications.  **Note: The testing scope of this panel includes these medications:  Alprazolam (Xanax)  Oxycodone  **Note: The testing scope of this panel does not include following  reported medications:  Carvedilol  Duloxetine  Losartan (Losartan Potassium)  Nystatin  Omeprazole (Nexium)  Polymyxin (PolyTrim)  Trimethoprim (PolyTrim) ==================================================================== For clinical consultation, please call [phone]. ====================================================================    Laboratory Chemistry Profile (12 mo)  Renal: 06/16/2019: BUN 13; BUN/Creatinine Ratio 17; Creatinine, Ser 0.76  Lab Results  Component Value Date   GFRAA 88 06/16/2019   GFRNONAA 76 06/16/2019   Hepatic: 06/16/2019: Albumin 4.4 Lab Results  Component Value Date   AST 13 06/16/2019   ALT 12 06/16/2019   Other: 08/08/2018: 25-Hydroxy, Vitamin D 20; 25-Hydroxy, Vitamin D-2 <1.0; 25-Hydroxy, Vitamin D-3 20; CRP  17; Sed Rate 94; Vitamin B-12 406 Note: Above Lab results reviewed.  Imaging  Last 90 days:  No results found.  Assessment  The primary encounter diagnosis was Chronic knee pain (Left). Diagnoses of Osteoarthritis of knee (Left), Chronic pain syndrome, Neurogenic pain, Neuropathy due to secondary diabetes (HCC), and Fibromyalgia were also pertinent to this visit.  Plan of Care  I have discontinued Cynthia Gowda. Timmons's IGlucose Test Strips. I am also having her start on gabapentin. Additionally, I am having her maintain her esomeprazole, losartan, carvedilol, ARIPiprazole, diclofenac sodium, DULoxetine, nystatin, oxyCODONE, oxyCODONE, levothyroxine, atorvastatin, ALPRAZolam, and oxyCODONE.  Pharmacotherapy (Medications Ordered): Meds ordered this encounter  Medications  . oxyCODONE (OXY IR/ROXICODONE) 5 MG immediate release tablet    Sig: Take 1 tablet (5 mg total)  by mouth every 8 (eight) hours as needed for severe pain. Must last 30 days    Dispense:  90 tablet    Refill:  0    Chronic Pain: STOP Act (Not applicable) Fill 1 day early if closed on refill date. Do not fill until: 09/07/2019. To last until: 10/07/2019. Avoid benzodiazepines within 8 hours of opioids  . gabapentin (NEURONTIN) 100 MG capsule    Sig: Take 1 capsule (100 mg total) by mouth at bedtime for 28 days, THEN 2 capsules (200 mg total) at bedtime for 28 days. Follow written titration schedule.[name]    Dispense:  84 capsule    Refill:  0    Fill one day early if pharmacy is closed on scheduled refill date. May substitute for generic if available.   Orders:  Orders Placed This Encounter  Procedures  . KNEE INJECTION    Hyalgan knee injection. Please order Hyalgan.    Standing Status:   Future    Standing Expiration Date:   08/04/2019    Scheduling Instructions:     Procedure: Intra-articular Hyalgan Knee injection #1     Side: Left-sided     Sedation: None     Timeframe: ASAA    Order Specific Question:   Where will this procedure be performed?    Answer:   [facility]   Follow-up plan:   Return in about 13 weeks (around 10/04/2019) for (VV), (MM), in addition, Procedure (no sedation): (L) Hyalgan #S2/N1; evaluate gabapentin titration.      Interventional treatment options: Planned, scheduled, and/or pending:   No further increases on opioid analgesics    Under consideration:   Possible right Genicular nerve RFA  Diagnostic left Genicular NB Possibleleft Genicular nerve RFA Possible bilateral lumbar facet RFA Diagnostic left LESI Diagnostic caudal ESI + epidurogram Possible Racz procedure Diagnostic bilateral cervical facet block Possible bilateral cervical facet RFA Diagnostic right versus left CESI Diagnostic bilateral IA shoulder joint injection Diagnostic bilateral suprascapular NB Possible bilateral suprascapular nerve RFA   Therapeutic/palliative  (PRN):  Diagnostic right Genicular NB #2(90/90/50)  Diagnosticleft IA knee joint injection #2(100/100/90)  Palliativeleft IA Hyalgan Knee injection S2N1 Diagnostic bilateral lumbar facet block #2    Recent Visits Date Type Provider Dept  05/22/19 Office Visit Delano Metz, MD [facility]  Showing recent visits within past 90 days and meeting all other requirements   Today's Visits Date Type Provider Dept  07/05/19 Telemedicine Delano Metz, MD [facility]  Showing today's visits and meeting all other requirements   Future Appointments Date Type Provider Dept  09/04/19 Appointment Delano Metz, MD [facility]  Showing future appointments within next 90 days and meeting all other requirements  I discussed the assessment and treatment plan with the patient. The patient was provided an opportunity to ask questions and all were answered. The patient agreed with the plan and demonstrated an understanding of the instructions.  Patient advised to call back or seek an in-person evaluation if the symptoms or condition worsens.  Total duration of non-face-to-face encounter: 22 minutes.  Note by: Cynthia Done, MD Date: 07/05/2019; Time: 9:41 AM  Note: This dictation was prepared with Dragon dictation. Any transcriptional errors that may result from this process are unintentional.  Disclaimer:  * Given the special circumstances of the COVID-19 pandemic, the federal government has announced that the Office for Civil Rights (OCR) will exercise its enforcement discretion and will not impose penalties on physicians using telehealth in the event of noncompliance with regulatory requirements under the [employer] Portability and Accountability Act (HIPAA) in connection with the good faith provision of telehealth during the COVID-19 national public health emergency. (AMA)

## 2019-07-05 ENCOUNTER — Other Ambulatory Visit: Payer: Self-pay

## 2019-07-05 ENCOUNTER — Ambulatory Visit: Payer: Medicare HMO | Attending: Pain Medicine | Admitting: Pain Medicine

## 2019-07-05 DIAGNOSIS — M792 Neuralgia and neuritis, unspecified: Secondary | ICD-10-CM

## 2019-07-05 DIAGNOSIS — M1712 Unilateral primary osteoarthritis, left knee: Secondary | ICD-10-CM

## 2019-07-05 DIAGNOSIS — G8929 Other chronic pain: Secondary | ICD-10-CM | POA: Diagnosis not present

## 2019-07-05 DIAGNOSIS — G894 Chronic pain syndrome: Secondary | ICD-10-CM

## 2019-07-05 DIAGNOSIS — M25562 Pain in left knee: Secondary | ICD-10-CM

## 2019-07-05 DIAGNOSIS — M797 Fibromyalgia: Secondary | ICD-10-CM

## 2019-07-05 DIAGNOSIS — E1340 Other specified diabetes mellitus with diabetic neuropathy, unspecified: Secondary | ICD-10-CM

## 2019-07-05 MED ORDER — OXYCODONE HCL 5 MG PO TABS: 5.0000 mg | ORAL_TABLET | Freq: Three times a day (TID) | ORAL | 0 refills | Status: DC | PRN

## 2019-07-05 MED ORDER — GABAPENTIN 100 MG PO CAPS: ORAL_CAPSULE | ORAL | 0 refills | Status: DC

## 2019-07-05 NOTE — Patient Instructions (Signed)
____________________________________________________________________________________________  Preparing for your procedure (without sedation)  Procedure appointments are limited to planned procedures: . No Prescription Refills. . No disability issues will be discussed. . No medication changes will be discussed.  Instructions: . Oral Intake: Do not eat or drink anything for at least 3 hours prior to your procedure. . Transportation: Unless otherwise stated by your physician, you may drive yourself after the procedure. . Blood Pressure Medicine: Take your blood pressure medicine with a sip of water the morning of the procedure. . Blood thinners: Notify our staff if you are taking any blood thinners. Depending on which one you take, there will be specific instructions on how and when to stop it. . Diabetics on insulin: Notify the staff so that you can be scheduled 1st case in the morning. If your diabetes requires high dose insulin, take only  of your normal insulin dose the morning of the procedure and notify the staff that you have done so. . Preventing infections: Shower with an antibacterial soap the morning of your procedure.  . Build-up your immune system: Take 1000 mg of Vitamin C with every meal (3 times a day) the day prior to your procedure. . Antibiotics: Inform the staff if you have a condition or reason that requires you to take antibiotics before dental procedures. . Pregnancy: If you are pregnant, call and cancel the procedure. . Sickness: If you have a cold, fever, or any active infections, call and cancel the procedure. . Arrival: You must be in the facility at least 30 minutes prior to your scheduled procedure. . Children: Do not bring any children with you. . Dress appropriately: Bring dark clothing that you would not mind if they get stained. . Valuables: Do not bring any jewelry or valuables.  Reasons to call and reschedule or cancel your procedure: (Following these  recommendations will minimize the risk of a serious complication.) . Surgeries: Avoid having procedures within 2 weeks of any surgery. (Avoid for 2 weeks before or after any surgery). . Flu Shots: Avoid having procedures within 2 weeks of a flu shots or . (Avoid for 2 weeks before or after immunizations). . Barium: Avoid having a procedure within 7-10 days after having had a radiological study involving the use of radiological contrast. (Myelograms, Barium swallow or enema study). . Heart attacks: Avoid any elective procedures or surgeries for the initial 6 months after a "Myocardial Infarction" (Heart Attack). . Blood thinners: It is imperative that you stop these medications before procedures. Let us know if you if you take any blood thinner.  . Infection: Avoid procedures during or within two weeks of an infection (including chest colds or gastrointestinal problems). Symptoms associated with infections include: Localized redness, fever, chills, night sweats or profuse sweating, burning sensation when voiding, cough, congestion, stuffiness, runny nose, sore throat, diarrhea, nausea, vomiting, cold or Flu symptoms, recent or current infections. It is specially important if the infection is over the area that we intend to treat. . Heart and lung problems: Symptoms that may suggest an active cardiopulmonary problem include: cough, chest pain, breathing difficulties or shortness of breath, dizziness, ankle swelling, uncontrolled high or unusually low blood pressure, and/or palpitations. If you are experiencing any of these symptoms, cancel your procedure and contact your primary care physician for an evaluation.  Remember:  Regular Business hours are:  Monday to Thursday 8:00 AM to 4:00 PM  Provider's Schedule: Marianne Golightly, MD:  Procedure days: Tuesday and Thursday 7:30 AM to 4:00 PM  [place]   Holley Raring, MD:  Procedure days: Monday and Wednesday 7:30 AM to 4:00  PM ____________________________________________________________________________________________   ____________________________________________________________________________________________  Initial Gabapentin Titration  Medication used: Gabapentin (Generic Name) or Neurontin (Brand Name) 100 mg tablets/capsules  Reasons to stop increasing the dose:  Reason 1: You get good relief of symptoms, in which case there is no need to increase the daily dose any further.    Reason 2: You develop some side effects, such as sleeping all of the time, difficulty concentrating, or becoming disoriented, in which case you need to go down on the dose, to the prior level, where you were not experiencing any side effects. Stay on that dose longer, to allow more time for your body to get use it, before attempting to increase it again.   Steps: Step 1: Start by taking 1 (one) tablet at bedtime x 7 (seven) days.  Step 2: After being on 1 (one) tablet for 7 (seven) days, then increase it to 2 (two) tablets at bedtime for another 7 (seven) days.  Step 3: Next, after being on 2 (two) tablets at bedtime for 7 (seven) days, then increase it to 3 (three) tablets at bedtime, and stay on that dose until you see your doctor.  Reasons to stop increasing the dose: Reason 1: You get good relief of symptoms, in which case there is no need to increase the daily dose any further.  Reason 2: You develop some side effects, such as sleeping all of the time, difficulty concentrating, or becoming disoriented, in which case you need to go down on the dose, to the prior level, where you were not experiencing any side effects. Stay on that dose longer, to allow more time for your body to get use it, before attempting to increase it again.  Endpoint: Once you have reached the maximum dose you can tolerate without side-effects, contact your physician so as to evaluate the results of the regimen.   Questions: Feel free to contact us  for any questions or problems at [phone] ____________________________________________________________________________________________ ____________________________________________________________________________________________  Drug Holidays (Slow)  What is a "Drug Holiday"? Drug Holiday: is the name given to the period of time during which a patient stops taking a medication(s) for the purpose of eliminating tolerance to the drug.  Benefits . Improved effectiveness of opioids. . Decreased opioid dose needed to achieve benefits. . Improved pain with lesser dose.  What is tolerance? Tolerance: is the progressive decreased in effectiveness of a drug due to its repetitive use. With repetitive use, the body gets use to the medication and as a consequence, it loses its effectiveness. This is a common problem seen with opioid pain medications. As a result, a larger dose of the drug is needed to achieve the same effect that used to be obtained with a smaller dose.  How long should a "Drug Holiday" last? You should stay off of the pain medicine for at least 14 consecutive days. (2 weeks)  Should I stop the medicine "cold [place]"? No. You should always coordinate with your Pain Specialist so that he/she can provide you with the correct medication dose to make the transition as smoothly as possible.  How do I stop the medicine? Slowly. You will be instructed to decrease the daily amount of pills that you take by one (1) pill every seven (7) days. This is called a "slow downward taper" of your dose. For example: if you normally take four (4) pills per day, you will be asked to drop this dose  to three (3) pills per day for seven (7) days, then to two (2) pills per day for seven (7) days, then to one (1) per day for seven (7) days, and at the end of those last seven (7) days, this is when the "Drug Holiday" would start.   Will I have withdrawals? By doing a "slow downward taper" like this one,  it is unlikely that you will experience any significant withdrawal symptoms. Typically, what triggers withdrawals is the sudden stop of a high dose opioid therapy. Withdrawals can usually be avoided by slowly decreasing the dose over a prolonged period of time.  What are withdrawals? Withdrawals: refers to the wide range of symptoms that occur after stopping or dramatically reducing opiate drugs after heavy and prolonged use. Withdrawal symptoms do not occur to patients that use low dose opioids, or those who take the medication sporadically. Contrary to benzodiazepine (example: Valium, Xanax, etc.) or alcohol withdrawals ("Delirium Tremens"), opioid withdrawals are not lethal. Withdrawals are the physical manifestation of the body getting rid of the excess receptors.  Expected Symptoms Early symptoms of withdrawal may include: . Agitation . Anxiety . Muscle aches . Increased tearing . Insomnia . Runny nose . Sweating . Yawning  Late symptoms of withdrawal may include: . Abdominal cramping . Diarrhea . Dilated pupils . Goose bumps . Nausea . Vomiting  Will I experience withdrawals? Due to the slow nature of the taper, it is very unlikely that you will experience any.  What is a slow taper? Taper: refers to the gradual decrease in dose.  ___________________________________________________________________________________________

## 2019-07-24 ENCOUNTER — Telehealth: Payer: Self-pay | Admitting: Family Medicine

## 2019-07-24 NOTE — Telephone Encounter (Signed)
Spoke with patient.

## 2019-07-24 NOTE — Telephone Encounter (Signed)
Please call and see what's going on with her  Copied from [place] [phone]. Topic: General - Other >> Jul 24, 2019  1:25 PM Antonieta Iba C wrote: Reason for CRM: CB: [phone]- pt is requesting a call back from Rachael if possible. Pt wouldn't give me details on what she needs, pt just said "Im not sick or anything"

## 2019-07-25 ENCOUNTER — Telehealth: Payer: Self-pay | Admitting: Family Medicine

## 2019-07-25 NOTE — Chronic Care Management (AMB) (Signed)
Chronic Care Management   Note  07/25/2019 Name: Cynthia Jones MRN: [id number] DOB: 11-Apr-1941  Cynthia Jones is a 78 y.o. year old female who is a primary care patient of Crissman, Jeannette How, MD. I reached out to Grayling Congress by phone today in response to a referral sent by Ms. Cynthia Jones's health plan.     [name] was given information about Chronic Care Management services today including:  1. CCM service includes personalized support from designated clinical staff supervised by her physician, including individualized plan of care and coordination with other care providers 2. 24/7 contact phone numbers for assistance for urgent and routine care needs. 3. Service will only be billed when office clinical staff spend 20 minutes or more in a month to coordinate care. 4. Only one practitioner may furnish and bill the service in a calendar month. 5. The patient may stop CCM services at any time (effective at the end of the month) by phone call to the office staff. 6. The patient will be responsible for cost sharing (co-pay) of up to 20% of the service fee (after annual deductible is met).  Patient agreed to services and verbal consent obtained.   Follow up plan: Telephone appointment with CCM team member scheduled for: 09/05/2019  [facility]  [address], [place] [address] Direct Dial: [email or web address]  Website: [email or web address]

## 2019-08-15 ENCOUNTER — Other Ambulatory Visit: Payer: Self-pay

## 2019-08-15 NOTE — Telephone Encounter (Signed)
Patient last seen 06/16/19

## 2019-08-16 MED ORDER — ARIPIPRAZOLE 2 MG PO TABS: 2.0000 mg | ORAL_TABLET | Freq: Every day | ORAL | 1 refills | Status: DC

## 2019-08-27 ENCOUNTER — Other Ambulatory Visit: Payer: Self-pay | Admitting: Pain Medicine

## 2019-08-27 DIAGNOSIS — M797 Fibromyalgia: Secondary | ICD-10-CM

## 2019-08-27 DIAGNOSIS — E1340 Other specified diabetes mellitus with diabetic neuropathy, unspecified: Secondary | ICD-10-CM

## 2019-08-27 DIAGNOSIS — M792 Neuralgia and neuritis, unspecified: Secondary | ICD-10-CM

## 2019-08-28 ENCOUNTER — Other Ambulatory Visit: Payer: Self-pay | Admitting: Family Medicine

## 2019-08-28 DIAGNOSIS — I10 Essential (primary) hypertension: Secondary | ICD-10-CM

## 2019-08-29 ENCOUNTER — Other Ambulatory Visit: Payer: Self-pay

## 2019-08-29 DIAGNOSIS — I10 Essential (primary) hypertension: Secondary | ICD-10-CM

## 2019-08-29 MED ORDER — ATORVASTATIN CALCIUM 10 MG PO TABS: 10.0000 mg | ORAL_TABLET | Freq: Every day | ORAL | 1 refills | Status: DC

## 2019-08-29 MED ORDER — LEVOTHYROXINE SODIUM 50 MCG PO TABS: 50.0000 ug | ORAL_TABLET | Freq: Every day | ORAL | 1 refills | Status: DC

## 2019-08-29 MED ORDER — CARVEDILOL 6.25 MG PO TABS: 6.2500 mg | ORAL_TABLET | Freq: Two times a day (BID) | ORAL | 2 refills | Status: DC

## 2019-09-04 ENCOUNTER — Ambulatory Visit: Payer: Medicare HMO | Admitting: Pain Medicine

## 2019-09-05 ENCOUNTER — Telehealth: Payer: Medicare HMO

## 2019-09-06 ENCOUNTER — Telehealth: Payer: Self-pay | Admitting: Family Medicine

## 2019-09-06 NOTE — Chronic Care Management (AMB) (Deleted)
  Care Management   Note  09/06/2019 Name: [name] MRN: [id number] DOB: [birth year]  Cynthia Jones is a 79 y.o. year old female who is a primary care patient of Dossie Arbour, Redge Gainer, MD and is actively engaged with the care management team. I reached out to [name] by phone today to assist with re-scheduling a initial outreach appointment with the RN Case Manager  Follow up plan: The care management team will reach out to the patient again over the next 7 days.   Randon Goldsmith Care Guide, Embedded Care Coordination [facility]  [place], [place] [address] Direct Dial: [phone] [email or web address]  [email or web address]: [email or web address]

## 2019-09-06 NOTE — Chronic Care Management (AMB) (Signed)
  Care Management   Note  09/06/2019 Name: Cynthia Jones MRN: [id number] DOB: 1940-09-06  Cynthia Jones is a 79 y.o. year old female who is a primary care patient of Dossie Arbour, Redge Gainer, MD and is actively engaged with the care management team. I reached out to Cynthia Jones by phone today to assist with re-scheduling a Initial outreach appointment with the Cynthia Jones  Follow up plan: Telephone appointment with care management team member scheduled for: 10/13/2019  [facility] Guide, Embedded Care Coordination [facility]  [place], [place] [address] Direct Dial: [phone] [email or web address]  Website: [email or web address]

## 2019-09-18 ENCOUNTER — Telehealth: Payer: Self-pay | Admitting: Pain Medicine

## 2019-09-18 NOTE — Telephone Encounter (Addendum)
Patient states she is in constant pain, meds are only helping a little bit for short periods of time. Back and Leg. She would like to know what options she has. She states it helps if she takes 2 mg pain pills but not for very long, and she only does that when she cant take pain any longer

## 2019-09-18 NOTE — Telephone Encounter (Signed)
I informed patient that she has 3 procedures ordered prn. She would like to think about it and call us back.

## 2019-09-22 ENCOUNTER — Telehealth: Payer: Self-pay

## 2019-09-22 NOTE — Telephone Encounter (Signed)
Called pt back, wanting to discuss her worsened pain sxs. Struggling with her ADLs now due to her chronic pain and fibromyalgia. She's followed by [facility] for her pain management, has not yet reached out to them about her worsened pain lately. Recommended she call them Monday to discuss options.   She also asked about if she should and could received COVID vaccine, which I confirmed that she could and it was recommended she do so

## 2019-09-22 NOTE — Telephone Encounter (Signed)
Copied from CRM [id number]. Topic: General - Other >> Sep 22, 2019 10:53 AM Angela Nevin wrote: Patient requesting to speak with Roosvelt Maser. Patient would not disclose any additional information other than it is "regarding a pre-existing condition that [name] knows about". >> Sep 22, 2019  4:48 PM Pablo Ledger, CMA wrote: Jeanene Erb and spoke with patient. Patient would not disclose any information with me and requested to speak to [place] only. Patient stated that it was something [name] knew about and she wanted her advice on something.

## 2019-10-03 ENCOUNTER — Telehealth: Payer: Self-pay

## 2019-10-03 ENCOUNTER — Other Ambulatory Visit: Payer: Self-pay

## 2019-10-03 DIAGNOSIS — E118 Type 2 diabetes mellitus with unspecified complications: Secondary | ICD-10-CM

## 2019-10-03 MED ORDER — NYSTATIN 100000 UNIT/GM EX POWD: CUTANEOUS | 1 refills | Status: DC

## 2019-10-03 NOTE — Telephone Encounter (Signed)
Attempted to call patient.  Message says call can not be completed at thisstime.

## 2019-10-03 NOTE — Telephone Encounter (Signed)
Patient last seen 06/16/19 and has appointment 12/15/19

## 2019-10-04 ENCOUNTER — Ambulatory Visit: Payer: Medicare HMO | Attending: Pain Medicine | Admitting: Pain Medicine

## 2019-10-04 ENCOUNTER — Other Ambulatory Visit: Payer: Self-pay

## 2019-10-04 DIAGNOSIS — M545 Low back pain, unspecified: Secondary | ICD-10-CM

## 2019-10-04 DIAGNOSIS — M25561 Pain in right knee: Secondary | ICD-10-CM | POA: Diagnosis not present

## 2019-10-04 DIAGNOSIS — M25562 Pain in left knee: Secondary | ICD-10-CM | POA: Diagnosis not present

## 2019-10-04 DIAGNOSIS — G894 Chronic pain syndrome: Secondary | ICD-10-CM

## 2019-10-04 DIAGNOSIS — G8929 Other chronic pain: Secondary | ICD-10-CM

## 2019-10-04 MED ORDER — OXYCODONE HCL 5 MG PO TABS: 5.0000 mg | ORAL_TABLET | Freq: Three times a day (TID) | ORAL | 0 refills | Status: DC | PRN

## 2019-10-04 NOTE — Progress Notes (Signed)
Patient: Cynthia Jones [id number]  Service Category: E/M  Provider: Oswaldo Done, MD  DOB: 02/16/1941  DOS: 10/04/2019  Location: Office  MRN: [id number]  Setting: Ambulatory outpatient  Referring Provider: Steele Sizer, MD  Type: Established Patient  Specialty: Interventional Pain Management  PCP: Steele Sizer, MD  Location: Remote location  Delivery: TeleHealth     Virtual Encounter - Pain Management PROVIDER NOTE: Information contained herein reflects review and annotations entered in association with encounter. Interpretation of such information and data should be left to medically-trained personnel. Information provided to patient can be located elsewhere in the medical record under "Patient Instructions". Document created using STT-dictation technology, any transcriptional errors that may result from process are unintentional.    Contact & Pharmacy Preferred: [phone] Home: [phone] (home) Mobile: [phone] (mobile) E-mail: [email or web address]  [id number] - [place], [place] - [address] [place] [address] Phone: [phone] Fax: [phone]   Pre-screening  Ms. Reeser offered "in-person" vs "virtual" encounter. She indicated preferring virtual for this encounter.   Reason COVID-19*  Social distancing based on CDC and AMA recommendations.   I contacted Colbie Sliker Kessinger on 10/04/2019 via telephone.      I clearly identified myself as Oswaldo Done, MD. I verified that I was speaking with the correct person using two identifiers (Name: Cynthia Jones, and date of birth: [birth year]).  Consent I sought verbal advanced consent from [name] for virtual visit interactions. I informed Ms. Mansour of possible security and privacy concerns, risks, and limitations associated with providing "not-in-person" medical evaluation and management services. I also informed Cynthia Jones of the availability of "in-person"  appointments. Finally, I informed her that there would be a charge for the virtual visit and that she could be  personally, fully or partially, financially responsible for it. Cynthia Jones expressed understanding and agreed to proceed.   Historic Elements   Cynthia Jones is a 79 y.o. year old, female patient evaluated today after her last encounter by our practice on 10/03/2019. Cynthia Jones  has a past medical history of Acute anxiety (03/06/2015), Allergy, Anxiety, Degenerative joint disease of knee, Depression, Fibromyalgia, Obesity, Osteopenia, and RAD (reactive airway disease). She also  has a past surgical history that includes Parotidectomy (Left); Appendectomy; Cholecystectomy; Abdominal hysterectomy; Tonsillectomy; Joint replacement (2014); Eye surgery; and Breast cyst aspiration (Right, 04/18/2013). Cynthia Jones has a current medication list which includes the following prescription(s): alprazolam, aripiprazole, carvedilol, diclofenac sodium, duloxetine, esomeprazole, levothyroxine, losartan, nystatin, [START ON 10/07/2019] oxycodone, [START ON 11/06/2019] oxycodone, and [START ON 12/06/2019] oxycodone. She  reports that she has never smoked. She has never used smokeless tobacco. She reports that she does not drink alcohol or use drugs. Cynthia Jones is allergic to nsaids and erythromycin.   HPI  Today, she is being contacted for medication management.  Today's encounter ended up being significantly longer than expected as I had a long conversation with the patient's daughter Wynona Jones).  According to the patient's daughter, she feels that the patient would not really communicate to me all the problems that she is having and therefore she has taken the lead in doing so.  Recently the patient call her asking for help, which is something that she describes as highly unusual.  Upon arriving to the mother's home, they found her to be extremely debilitated and apparently she had experienced some food  poisoning after having eaten some chicken.  She indicated that this food poisoning was shared  by all of those who consumed bacon and that is how they know that this is what happened.  Unfortunately, they also indicated that they did not take the patient to the emergency room and they have been dealing with the problem.  They found her to be incontinent, unable to ambulate, unable to bathe herself or to take care of her basic activities of daily living.  They noticed her to have a shuffling gait and they indicated that she had experienced couple of days of significant diarrhea and vomiting.  They also indicated that her primary care physician [name] has retired and apparently her new physician's first name is [name], but I really did not catch her last name.  The patient has stopped taking the gabapentin because she indicated that it made her feel weird and apparently was not contributing that much to her analgesia.  They indicated that her pain seemed to be out of control and she was very sensitive to touch.  Today I have recommended that they take her to an urgent care facility or an emergency room to have some blood work done since there is always a possibility that she may be experiencing hypokalemia secondary to the diarrhea and vomiting.  I explained to them that hypokalemia could be responsible for the weakness that they are observing and I also explained to them that it is important that this be checked since levels below 3.2 could end up causing some serious health problems including cardiac arrhythmias.  She indicated having understood.  In addition her daughter Wynona Jones indicated that she believes that her mother may need palliative care since she is no longer able to take care of herself.  She also indicated that by the time she arrived to her mother's home, she found the leaving conditions to be appalling and she removed "crawl clothes" of garbage from her mother's house.  I have  recommended that they contact her primary care physician to make arrangements for that palliative care since she will probably need to follow-up with that.  They indicated that they will.  I did have the opportunity to talk to the patient about her medications and she indicated that she is not having any problems with the oxycodone but she admitted that she could not tolerate the gabapentin and she is currently not taking it any longer.  She is using the cream that we prescribed with some benefit.  Pharmacotherapy Assessment  Analgesic: Oxycodone IR 5 mg, 1 tab PO q8 hrs (15 mg/day of oxycodone) (enough to last until 10/07/2019) MME/day:15mg /day.   Monitoring: [place] PMP: PDMP reviewed during this encounter.       Pharmacotherapy: No side-effects or adverse reactions reported. Compliance: No problems identified. Effectiveness: Clinically acceptable. Plan: Refer to "POC".  UDS:  Summary  Date Value Ref Range Status  02/09/2018 FINAL  Final    Comment:    ==================================================================== TOXASSURE SELECT 13 (MW) ==================================================================== Test                             Result       Flag       Units Drug Present and Declared for Prescription Verification   Oxycodone                      1276         EXPECTED   ng/mg creat   Noroxycodone  2447         EXPECTED   ng/mg creat    Sources of oxycodone include scheduled prescription medications.    Noroxycodone is an expected metabolite of oxycodone. Drug Absent but Declared for Prescription Verification   Alprazolam                     Not Detected UNEXPECTED ng/mg creat ==================================================================== Test                      Result    Flag   Units      Ref Range   Creatinine              109              mg/dL      >=42 ==================================================================== Declared  Medications:  The flagging and interpretation on this report are based on the  following declared medications.  Unexpected results may arise from  inaccuracies in the declared medications.  **Note: The testing scope of this panel includes these medications:  Alprazolam (Xanax)  Oxycodone  **Note: The testing scope of this panel does not include following  reported medications:  Carvedilol  Duloxetine  Losartan (Losartan Potassium)  Nystatin  Omeprazole (Nexium)  Polymyxin (PolyTrim)  Trimethoprim (PolyTrim) ==================================================================== For clinical consultation, please call [phone]. ====================================================================    Laboratory Chemistry Profile (12 mo)  Renal: 06/16/2019: BUN 13; BUN/Creatinine Ratio 17; Creatinine, Ser 0.76  Lab Results  Component Value Date   GFRAA 88 06/16/2019   GFRNONAA 76 06/16/2019   Hepatic: 06/16/2019: Albumin 4.4 Lab Results  Component Value Date   AST 13 06/16/2019   ALT 12 06/16/2019   Other: No results found for requested labs within last 8760 hours.  Note: Above Lab results reviewed.  Imaging  [place] AXILLA RIGHT CLINICAL DATA:  79 year old presenting with a possible palpable lump in the right axilla, intermittently tender. Annual mammographic evaluation.  EXAM: DIGITAL DIAGNOSTIC BILATERAL MAMMOGRAM WITH CAD AND TOMO  ULTRASOUND RIGHT AXILLA  COMPARISON:  Mammography 03/26/2014, 03/01/2013 and earlier. No prior right axillary ultrasound.  ACR Breast Density Category b: There are scattered areas of fibroglandular density.  FINDINGS: Standard 2D and tomosynthesis full field CC and MLO views of both breasts were obtained.  No findings suspicious for malignancy in either breast.  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  Mammographic images were processed with CAD.  On physical exam, there is no palpable mass or lymphadenopathy in the right axilla in the area of patient concern.  Targeted right axillary ultrasound is performed, showing normal fibrofatty tissue in the area of palpable concern. A normal-appearing level 2 lymph node is present behind the pectoralis muscle deep in the area of palpable concern measuring approximate 1.2 cm. No mass or pathologic lymphadenopathy is identified.  IMPRESSION: 1. No mammographic evidence of malignancy involving either breast. 2. No pathologic right axillary lymphadenopathy.  RECOMMENDATION: Screening mammogram in one year.(Code:SM-B-01Y)  I have discussed the findings and recommendations with the patient. Results were also provided in writing at the conclusion of the visit. If applicable, a reminder letter will be sent to the patient regarding the next appointment.  BI-RADS CATEGORY  2: Benign.  Electronically Signed   By: Hulan Saas M.D.   On: 11/03/2017 12:04 MM DIAG BREAST TOMO BILATERAL CLINICAL DATA:  79 year old presenting with a possible palpable  lump in the right axilla, intermittently tender. Annual mammographic evaluation.  EXAM: DIGITAL DIAGNOSTIC BILATERAL MAMMOGRAM WITH CAD AND TOMO  ULTRASOUND RIGHT AXILLA  COMPARISON:  Mammography 03/26/2014, 03/01/2013 and earlier. No prior right axillary ultrasound.  ACR Breast Density Category b: There are scattered areas of fibroglandular density.  FINDINGS: Standard 2D and tomosynthesis full field CC and MLO views of both breasts were obtained.  No findings suspicious for malignancy in either breast.  Benign dystrophic calcifications associated with a degenerating fibroadenoma in the lower left breast at middle to posterior depth, unchanged dating back to 2010.  Mammographic images were processed with CAD.  On physical exam, there is no palpable mass or lymphadenopathy  in the right axilla in the area of patient concern.  Targeted right axillary ultrasound is performed, showing normal fibrofatty tissue in the area of palpable concern. A normal-appearing level 2 lymph node is present behind the pectoralis muscle deep in the area of palpable concern measuring approximate 1.2 cm. No mass or pathologic lymphadenopathy is identified.  IMPRESSION: 1. No mammographic evidence of malignancy involving either breast. 2. No pathologic right axillary lymphadenopathy.  RECOMMENDATION: Screening mammogram in one year.(Code:SM-B-01Y)  I have discussed the findings and recommendations with the patient. Results were also provided in writing at the conclusion of the visit. If applicable, a reminder letter will be sent to the patient regarding the next appointment.  BI-RADS CATEGORY  2: Benign.  Electronically Signed   By: Evangeline Dakin M.D.   On: 11/03/2017 12:04   Assessment  The primary encounter diagnosis was Chronic pain syndrome. Diagnoses of Chronic knee pain (Primary Area of Pain) (Bilateral) (L>R) and Chronic low back pain (Secondary Area of Pain) (Bilateral) (L>R) were also pertinent to this visit.  Plan of Care  Problem-specific:  No problem-specific Assessment & Plan notes found for this encounter.  I have discontinued Jerene Pitch. Kane's gabapentin and atorvastatin. I am also having her start on oxyCODONE and oxyCODONE. Additionally, I am having her maintain her esomeprazole, diclofenac sodium, DULoxetine, ALPRAZolam, ARIPiprazole, losartan, carvedilol, levothyroxine, nystatin, and oxyCODONE.  Pharmacotherapy (Medications Ordered): Meds ordered this encounter  Medications  . oxyCODONE (OXY IR/ROXICODONE) 5 MG immediate release tablet    Sig: Take 1 tablet (5 mg total) by mouth every 8 (eight) hours as needed for severe pain. Must last 30 days    Dispense:  90 tablet    Refill:  0    Chronic Pain: STOP Act (Not applicable) Fill 1 day early  if closed on refill date. Do not fill until: 10/07/2019. To last until: 11/06/2019. Avoid benzodiazepines within 8 hours of opioids  . oxyCODONE (OXY IR/ROXICODONE) 5 MG immediate release tablet    Sig: Take 1 tablet (5 mg total) by mouth every 8 (eight) hours as needed for severe pain. Must last 30 days    Dispense:  90 tablet    Refill:  0    Chronic Pain: STOP Act (Not applicable) Fill 1 day early if closed on refill date. Do not fill until: 11/06/2019. To last until: 12/06/2019. Avoid benzodiazepines within 8 hours of opioids  . oxyCODONE (OXY IR/ROXICODONE) 5 MG immediate release tablet    Sig: Take 1 tablet (5 mg total) by mouth every 8 (eight) hours as needed for severe pain. Must last 30 days    Dispense:  90 tablet    Refill:  0    Chronic Pain: STOP Act (Not applicable) Fill 1 day early if closed on refill date. Do not fill until:  12/06/2019. To last until: 01/05/2020. Avoid benzodiazepines within 8 hours of opioids   Orders:  No orders of the defined types were placed in this encounter.  Follow-up plan:   Return in about 13 weeks (around 01/03/2020) for (VV), (MM).      Interventional treatment options: Planned, scheduled, and/or pending:   No further increases on opioid analgesics    Under consideration:   Possible right Genicular nerve RFA  Diagnostic left Genicular NB Possibleleft Genicular nerve RFA Possible bilateral lumbar facet RFA Diagnostic left LESI Diagnostic caudal ESI + epidurogram Possible Racz procedure Diagnostic bilateral cervical facet block Possible bilateral cervical facet RFA Diagnostic right versus left CESI Diagnostic bilateral IA shoulder joint injection Diagnostic bilateral suprascapular NB Possible bilateral suprascapular nerve RFA   Therapeutic/palliative (PRN):  Diagnostic right Genicular NB #2(90/90/50)  Diagnosticleft IA knee joint injection #2(100/100/90)  Palliativeleft IA Hyalgan Knee injection S2N1 Diagnostic bilateral lumbar  facet block #2     Recent Visits No visits were found meeting these conditions.  Showing recent visits within past 90 days and meeting all other requirements   Today's Visits Date Type Provider Dept  10/04/19 Telemedicine Delano Metz, MD [facility]  Showing today's visits and meeting all other requirements   Future Appointments No visits were found meeting these conditions.  Showing future appointments within next 90 days and meeting all other requirements   I discussed the assessment and treatment plan with the patient. The patient was provided an opportunity to ask questions and all were answered. The patient agreed with the plan and demonstrated an understanding of the instructions.  Patient advised to call back or seek an in-person evaluation if the symptoms or condition worsens.  Duration of encounter: 35 minutes.  Note by: Oswaldo Done, MD Date: 10/04/2019; Time: 11:28 AM

## 2019-10-09 ENCOUNTER — Encounter: Payer: Self-pay | Admitting: Family Medicine

## 2019-10-12 ENCOUNTER — Other Ambulatory Visit: Payer: Self-pay | Admitting: Family Medicine

## 2019-10-12 DIAGNOSIS — F419 Anxiety disorder, unspecified: Secondary | ICD-10-CM

## 2019-10-12 NOTE — Telephone Encounter (Signed)
Patient last seen 06/16/19 and has appointment 10/16/19

## 2019-10-12 NOTE — Telephone Encounter (Signed)
Requested medication (s) are due for refill today: Yes  Requested medication (s) are on the active medication list: Yes  Last refill:  06/16/19  Future visit scheduled: Yes  Notes to clinic:  See request.    Requested Prescriptions  Pending Prescriptions Disp Refills   ALPRAZolam (XANAX) 0.25 MG tablet [Pharmacy Med Name: ALPRAZOLAM 0.25 MG TABLET] 30 tablet 1    Sig: TAKE 1 TABLET BY MOUTH DAILY AS NEEDED FOR ANXIETY      Not Delegated - Psychiatry:  Anxiolytics/Hypnotics Failed - 10/12/2019 11:15 AM      Failed - This refill cannot be delegated      Failed - Urine Drug Screen completed in last 360 days.      Passed - Valid encounter within last 6 months    Recent Outpatient Visits           3 months ago Essential hypertension   [facility] Roosvelt Maser [place], [place]   9 months ago Acute sinusitis, recurrence not specified, unspecified location   [facility], Redge Gainer, MD   10 months ago Acute non-recurrent maxillary sinusitis   [facility] [place], [place], DO   10 months ago Nausea vomiting and diarrhea   [facility] Roosvelt Maser [place], [place]   11 months ago Anxiety   [facility] [place], Salley Hews, [place]       Future Appointments             In 4 days Maurice March, Salley Hews, PA-C [facility]   In 2 months Maurice March, [place]ley Hews, PA-C [facility]   In 2 months Delano Metz, MD [facility]

## 2019-10-13 ENCOUNTER — Ambulatory Visit (INDEPENDENT_AMBULATORY_CARE_PROVIDER_SITE_OTHER): Payer: Medicare HMO | Admitting: General Practice

## 2019-10-13 DIAGNOSIS — I10 Essential (primary) hypertension: Secondary | ICD-10-CM

## 2019-10-13 DIAGNOSIS — F3342 Major depressive disorder, recurrent, in full remission: Secondary | ICD-10-CM

## 2019-10-13 DIAGNOSIS — G894 Chronic pain syndrome: Secondary | ICD-10-CM

## 2019-10-13 DIAGNOSIS — F419 Anxiety disorder, unspecified: Secondary | ICD-10-CM

## 2019-10-13 NOTE — Chronic Care Management (AMB) (Signed)
Chronic Care Management   Initial Visit Note  10/13/2019 Name: Cynthia Jones MRN: [id number] DOB: 04/02/1941  Referred by: Volney American, PA-C Reason for referral : Chronic Care Management (HTN/DM/Depression/AnxietyChronic Pain)   Cynthia Jones is a 79 y.o. year old female who is a primary care patient of Volney American, [place]. The CCM team was consulted for assistance with chronic disease management and care coordination needs related to HTN, Anxiety, Depression and Chronic pain  Review of patient status, including review of consultants reports, relevant laboratory and other test results, and collaboration with appropriate care team members and the patient's provider was performed as part of comprehensive patient evaluation and provision of chronic care management services.    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 See Care Plan for related entries.   Medications: Outpatient Encounter Medications as of 10/13/2019  Medication Sig   ARIPiprazole (ABILIFY) 2 MG tablet Take 1 tablet (2 mg total) by mouth daily.   carvedilol (COREG) 6.25 MG tablet Take 1 tablet (6.25 mg total) by mouth 2 (two) times daily with a meal.   diclofenac sodium (VOLTAREN) 1 % GEL Apply 2 g topically 4 (four) times daily.   DULoxetine (CYMBALTA) 60 MG capsule TAKE 1 CAPSULE BY MOUTH EVERY DAY   esomeprazole (NEXIUM) 40 MG capsule Take 40 mg by mouth daily at 12 noon.   levothyroxine (SYNTHROID) 50 MCG tablet Take 1 tablet (50 mcg total) by mouth daily.   losartan (COZAAR) 100 MG tablet TAKE 1 TABLET BY MOUTH EVERY DAY   nystatin (MYCOSTATIN/NYSTOP) powder APPLY TO AFFECTED AREA 3 TIMES A DAY   oxyCODONE (OXY IR/ROXICODONE) 5 MG immediate release tablet Take 1 tablet (5 mg total) by mouth every 8 (eight) hours  as needed for severe pain. Must last 30 days   [START ON 11/06/2019] oxyCODONE (OXY IR/ROXICODONE) 5 MG immediate release tablet Take 1 tablet (5 mg total) by mouth every 8 (eight) hours as needed for severe pain. Must last 30 days   [START ON 12/06/2019] oxyCODONE (OXY IR/ROXICODONE) 5 MG immediate release tablet Take 1 tablet (5 mg total) by mouth every 8 (eight) hours as needed for severe pain. Must last 30 days   [DISCONTINUED] ALPRAZolam (XANAX) 0.25 MG tablet Take 1 tablet (0.25 mg total) by mouth daily as needed for anxiety.   No facility-administered encounter medications on file as of 10/13/2019.     Objective:  BP Readings from Last 3 Encounters:  06/16/19 124/83  11/20/18 (!) 164/56  11/01/18 137/64    Goals Addressed            This Visit's Progress    RNCM: I need help with getting my medication       Current Barriers:   Knowledge Deficits related to obtaining refill for xanax  Chronic Disease Management support and education needs related to depression and anxiety  Nurse Case Manager Clinical Goal(s):   Over the next 90 days, patient will verbalize understanding of plan for ,managing depression and anxiety effectively   Over the next 90 days, patient will work with CCM team and pcp to address needs related to Depression and anxiety  Over the next 90 days, patient will demonstrate a decrease in anxiety  exacerbations as evidenced by ability to effectively cope with life altering changes of not being in the home that she and her husband shared  for over 50 years  Over the next 90 days, patient will attend all scheduled medical appointments: appointment on Monday, 10-16-2019   Over the next 90 days, patient will verbalize basic understanding of chronic disease process and self health management plan as evidenced by coping mechanisms to help her manage her anxiety and depression  Over the next 60 days, patient will work with CM team pharmacist to reconcile medication  and questions concerns about COVID19 vaccine  Over the next 60 days, patient will work with CM clinical social worker to help with depression and anxiety   Interventions:   Evaluation of current treatment plan related to depression and anxiety  and patient's adherence to plan as established by provider.  Discussed plans with patient for ongoing care management follow up and provided patient with direct contact information for care management team  Provided patient with depression and anxiety  educational materials related to coping skills   Reviewed scheduled/upcoming provider appointments including:   Social Work referral for assessment of depression and anxiety   Pharmacy referral for medication review and questions related to [place] message sent to pcp letting the pcp know the patient was out of xanax. A script will be sent electronically to the pharmacy the patient uses.  Patient Self Care Activities:   Patient verbalizes understanding of plan to work with CCM team to assist in needs related to depression and anxiety and other concerns  Attends all scheduled provider appointments  Calls provider office for new concerns or questions  Unable to independently manage depression and anxiety due to the death of her nephew recently and moving in with her daughter.  The patient is tearful about having to leave her home that she and her husband shared for over 50 years.  Initial goal documentation         Ms. Garlick was given information about Chronic Care Management services today including:  1. CCM service includes personalized support from designated clinical staff supervised by her physician, including individualized plan of care and coordination with other care providers 2. 24/7 contact phone numbers for assistance for urgent and routine care needs. 3. Service will only be billed when office clinical staff spend 20 minutes or more in a month to coordinate  care. 4. Only one practitioner may furnish and bill the service in a calendar month. 5. The patient may stop CCM services at any time (effective at the end of the month) by phone call to the office staff. 6. The patient will be responsible for cost sharing (co-pay) of up to 20% of the service fee (after annual deductible is met).  Patient agreed to services and verbal consent obtained.   Plan:   Telephone follow up appointment with care management team member scheduled for:11-29-2019 at 2 pm  [name] RN, MSN, [facility] Mobile: [phone]

## 2019-10-13 NOTE — Patient Instructions (Signed)
Visit Information  Goals Addressed            This Visit's Progress   . RNCM: I need help with getting my medication       Current Barriers:  [name] Knowledge Deficits related to obtaining refill for xanax . Chronic Disease Management support and education needs related to depression and anxiety  Nurse Case Manager Clinical Goal(s):  [name] Over the next 90 days, patient will verbalize understanding of plan for ,managing depression and anxiety effectively  . Over the next 90 days, patient will work with CCM team and pcp to address needs related to Depression and anxiety . Over the next 90 days, patient will demonstrate a decrease in anxiety  exacerbations as evidenced by ability to effectively cope with life altering changes of not being in the home that she and her husband shared for over 50 years . Over the next 90 days, patient will attend all scheduled medical appointments: appointment on Monday, 10-16-2019  . Over the next 90 days, patient will verbalize basic understanding of chronic disease process and self health management plan as evidenced by coping mechanisms to help her manage her anxiety and depression . Over the next 60 days, patient will work with CM team pharmacist to reconcile medication and questions concerns about COVID19 vaccine . Over the next 60 days, patient will work with CM clinical social worker to help with depression and anxiety   Interventions:  . Evaluation of current treatment plan related to depression and anxiety  and patient's adherence to plan as established by provider. . Discussed plans with patient for ongoing care management follow up and provided patient with direct contact information for care management team . Provided patient with depression and anxiety  educational materials related to coping skills  . Reviewed scheduled/upcoming provider appointments including:  . Social Work referral for assessment of depression and anxiety  . Pharmacy referral for  medication review and questions related to [place] . Secure message sent to pcp letting the pcp know the patient was out of xanax. A script will be sent electronically to the pharmacy the patient uses.  Patient Self Care Activities:  . Patient verbalizes understanding of plan to work with CCM team to assist in needs related to depression and anxiety and other concerns . Attends all scheduled provider appointments . Calls provider office for new concerns or questions . Unable to independently manage depression and anxiety due to the death of her nephew recently and moving in with her daughter.  The patient is tearful about having to leave her home that she and her husband shared for over 50 years.  Initial goal documentation        Ms. Wrigley was given information about Chronic Care Management services today including:  1. CCM service includes personalized support from designated clinical staff supervised by her physician, including individualized plan of care and coordination with other care providers 2. 24/7 contact phone numbers for assistance for urgent and routine care needs. 3. Service will only be billed when office clinical staff spend 20 minutes or more in a month to coordinate care. 4. Only one practitioner may furnish and bill the service in a calendar month. 5. The patient may stop CCM services at any time (effective at the end of the month) by phone call to the office staff. 6. The patient will be responsible for cost sharing (co-pay) of up to 20% of the service fee (after annual deductible is met).  Patient agreed to services  and verbal consent obtained.   The patient verbalized understanding of instructions provided today and declined a print copy of patient instruction materials.   Telephone follow up appointment with care management team member scheduled for:11-29-2019 at 2 pm  [name] RN, MSN, [facility] Mobile: [phone]  For information about COVID-19 or "Corona Virus", the following web resources may be helpful:  CDC: BeginnerSteps.be    [place]:  [email or web address]   Managing Anxiety, Adult After being diagnosed with an anxiety disorder, you may be relieved to know why you have felt or behaved a certain way. You may also feel overwhelmed about the treatment ahead and what it will mean for your life. With care and support, you can manage this condition and recover from it. How to manage lifestyle changes Managing stress and anxiety  Stress is your body's reaction to life changes and events, both good and bad. Most stress will last just a few hours, but stress can be ongoing and can lead to more than just stress. Although stress can play a major role in anxiety, it is not the same as anxiety. Stress is usually caused by something external, such as a deadline, test, or competition. Stress normally passes after the triggering event has ended.  Anxiety is caused by something internal, such as imagining a terrible outcome or worrying that something will go wrong that will devastate you. Anxiety often does not go away even after the triggering event is over, and it can become long-term (chronic) worry. It is important to understand the differences between stress and anxiety and to manage your stress effectively so that it does not lead to an anxious response. Talk with your health care provider or a counselor to learn more about reducing anxiety and stress. He or she may suggest tension reduction techniques, such as:  Music therapy. This can include creating or listening to music that you enjoy and that inspires you.  Mindfulness-based meditation. This involves being aware of your normal breaths while not trying to control your  breathing. It can be done while sitting or walking.  Centering prayer. This involves focusing on a word, phrase, or sacred image that means something to you and brings you peace.  Deep breathing. To do this, expand your stomach and inhale slowly through your nose. Hold your breath for 3-5 seconds. Then exhale slowly, letting your stomach muscles relax.  Self-talk. This involves identifying thought patterns that lead to anxiety reactions and changing those patterns.  Muscle relaxation. This involves tensing muscles and then relaxing them. Choose a tension reduction technique that suits your lifestyle and personality. These techniques take time and practice. Set aside 5-15 minutes a day to do them. Therapists can offer counseling and training in these techniques. The training to help with anxiety may be covered by some insurance plans. Other things you can do to manage stress and anxiety include:  Keeping a stress/anxiety diary. This can help you learn what triggers your reaction and then learn ways to manage your response.  Thinking about how you react to certain situations. You may not be able to control everything, but you can control your response.  Making time for activities that help you relax and not feeling guilty about spending your time in this way.  Visual imagery and yoga can help you stay calm and relax.  Medicines Medicines can help ease symptoms. Medicines for anxiety include:  Anti-anxiety drugs.  Antidepressants. Medicines  are often used as a primary treatment for anxiety disorder. Medicines will be prescribed by a health care provider. When used together, medicines, psychotherapy, and tension reduction techniques may be the most effective treatment. Relationships Relationships can play a big part in helping you recover. Try to spend more time connecting with trusted friends and family members. Consider going to couples counseling, taking family education classes, or going  to family therapy. Therapy can help you and others better understand your condition. How to recognize changes in your anxiety Everyone responds differently to treatment for anxiety. Recovery from anxiety happens when symptoms decrease and stop interfering with your daily activities at home or work. This may mean that you will start to:  Have better concentration and focus. Worry will interfere less in your daily thinking.  Sleep better.  Be less irritable.  Have more energy.  Have improved memory. It is important to recognize when your condition is getting worse. Contact your health care provider if your symptoms interfere with home or work and you feel like your condition is not improving. Follow these instructions at home: Activity  Exercise. Most adults should do the following: ? Exercise for at least 150 minutes each week. The exercise should increase your heart rate and make you sweat (moderate-intensity exercise). ? Strengthening exercises at least twice a week.  Get the right amount and quality of sleep. Most adults need 7-9 hours of sleep each night. Lifestyle   Eat a healthy diet that includes plenty of vegetables, fruits, whole grains, low-fat dairy products, and lean protein. Do not eat a lot of foods that are high in solid fats, added sugars, or salt.  Make choices that simplify your life.  Do not use any products that contain nicotine or tobacco, such as cigarettes, e-cigarettes, and chewing tobacco. If you need help quitting, ask your health care provider.  Avoid caffeine, alcohol, and certain over-the-counter cold medicines. These may make you feel worse. Ask your pharmacist which medicines to avoid. General instructions  Take over-the-counter and prescription medicines only as told by your health care provider.  Keep all follow-up visits as told by your health care provider. This is important. Where to find support You can get help and support from these  sources:  Self-help groups.  Online and [employer].  A trusted spiritual leader.  Couples counseling.  Family education classes.  Family therapy. Where to find more information You may find that joining a support group helps you deal with your anxiety. The following sources can help you locate counselors or support groups near you:  [place]: www.mentalhealthamerica.net  Anxiety and Depression Association of [place] (ADAA): https://www.clark.net/  National Alliance on Mental Illness (NAMI): www.nami.org Contact a health care provider if you:  Have a hard time staying focused or finishing daily tasks.  Spend many hours a day feeling worried about everyday life.  Become exhausted by worry.  Start to have headaches, feel tense, or have nausea.  Urinate more than normal.  Have diarrhea. Get help right away if you have:  A racing heart and shortness of breath.  Thoughts of hurting yourself or others. If you ever feel like you may hurt yourself or others, or have thoughts about taking your own life, get help right away. You can go to your nearest emergency department or call:  Your local emergency services (911 in the [place]  A suicide crisis helpline, such as the [place] at (229)846-8885. This is open 24 hours a day.  Summary  Taking steps to learn and use tension reduction techniques can help calm you and help prevent triggering an anxiety reaction.  When used together, medicines, psychotherapy, and tension reduction techniques may be the most effective treatment.  Family, friends, and partners can play a big part in helping you recover from an anxiety disorder. This information is not intended to replace advice given to you by your health care provider. Make sure you discuss any questions you have with your health care provider. Document Revised: 01/17/2019 Document Reviewed: 01/17/2019 Elsevier Patient Education  [place] DASH stands for "Dietary Approaches to Stop Hypertension." The DASH eating plan is a healthy eating plan that has been shown to reduce high blood pressure (hypertension). It may also reduce your risk for type 2 diabetes, heart disease, and stroke. The DASH eating plan may also help with weight loss. What are tips for following this plan?  General guidelines  Avoid eating more than 2,300 mg (milligrams) of salt (sodium) a day. If you have hypertension, you may need to reduce your sodium intake to 1,500 mg a day.  Limit alcohol intake to no more than 1 drink a day for nonpregnant women and 2 drinks a day for men. One drink equals 12 oz of beer, 5 oz of wine, or 1 oz of hard liquor.  Work with your health care provider to maintain a healthy body weight or to lose weight. Ask what an ideal weight is for you.  Get at least 30 minutes of exercise that causes your heart to beat faster (aerobic exercise) most days of the week. Activities may include walking, swimming, or biking.  Work with your health care provider or diet and nutrition specialist (dietitian) to adjust your eating plan to your individual calorie needs. Reading food labels   Check food labels for the amount of sodium per serving. Choose foods with less than 5 percent of the Daily Value of sodium. Generally, foods with less than 300 mg of sodium per serving fit into this eating plan.  To find whole grains, look for the word "whole" as the first word in the ingredient list. Shopping  Buy products labeled as "low-sodium" or "no salt added."  Buy fresh foods. Avoid canned foods and premade or frozen meals. Cooking  Avoid adding salt when cooking. Use salt-free seasonings or herbs instead of table salt or sea salt. Check with your health care provider or pharmacist before using salt substitutes.  Do not fry foods. Cook foods using healthy methods such as baking, boiling, grilling, and broiling  instead.  Cook with heart-healthy oils, such as olive, canola, soybean, or sunflower oil. Meal planning  Eat a balanced diet that includes: ? 5 or more servings of fruits and vegetables each day. At each meal, try to fill half of your plate with fruits and vegetables. ? Up to 6-8 servings of whole grains each day. ? Less than 6 oz of lean meat, poultry, or fish each day. A 3-oz serving of meat is about the same size as a deck of cards. One egg equals 1 oz. ? 2 servings of low-fat dairy each day. ? A serving of nuts, seeds, or beans 5 times each week. ? Heart-healthy fats. Healthy fats called Omega-3 fatty acids are found in foods such as flaxseeds and coldwater fish, like sardines, salmon, and mackerel.  Limit how much you eat of the following: ? Canned or prepackaged foods. ? Food that is high in trans  fat, such as fried foods. ? Food that is high in saturated fat, such as fatty meat. ? Sweets, desserts, sugary drinks, and other foods with added sugar. ? Full-fat dairy products.  Do not salt foods before eating.  Try to eat at least 2 vegetarian meals each week.  Eat more home-cooked food and less restaurant, buffet, and fast food.  When eating at a restaurant, ask that your food be prepared with less salt or no salt, if possible. What foods are recommended? The items listed may not be a complete list. Talk with your dietitian about what dietary choices are best for you. Grains Whole-grain or whole-wheat bread. Whole-grain or whole-wheat pasta. Brown rice. [place]. Bulgur. Whole-grain and low-sodium cereals. Pita bread. Low-fat, low-sodium crackers. Whole-wheat flour tortillas. Vegetables Fresh or frozen vegetables (raw, steamed, roasted, or grilled). Low-sodium or reduced-sodium tomato and vegetable juice. Low-sodium or reduced-sodium tomato sauce and tomato paste. Low-sodium or reduced-sodium canned vegetables. Fruits All fresh, dried, or frozen fruit. Canned fruit in  natural juice (without added sugar). Meat and other protein foods Skinless chicken or [place]. Ground chicken or [place]. Pork with fat trimmed off. Fish and seafood. Egg whites. Dried beans, peas, or lentils. Unsalted nuts, nut butters, and seeds. Unsalted canned beans. Lean cuts of beef with fat trimmed off. Low-sodium, lean deli meat. Dairy Low-fat (1%) or fat-free (skim) milk. Fat-free, low-fat, or reduced-fat cheeses. Nonfat, low-sodium ricotta or cottage cheese. Low-fat or nonfat yogurt. Low-fat, low-sodium cheese. Fats and oils Soft margarine without trans fats. Vegetable oil. Low-fat, reduced-fat, or light mayonnaise and salad dressings (reduced-sodium). Canola, safflower, olive, soybean, and sunflower oils. Avocado. Seasoning and other foods Herbs. Spices. Seasoning mixes without salt. Unsalted popcorn and pretzels. Fat-free sweets. What foods are not recommended? The items listed may not be a complete list. Talk with your dietitian about what dietary choices are best for you. Grains Baked goods made with fat, such as croissants, muffins, or some breads. Dry pasta or rice meal packs. Vegetables Creamed or fried vegetables. Vegetables in a cheese sauce. Regular canned vegetables (not low-sodium or reduced-sodium). Regular canned tomato sauce and paste (not low-sodium or reduced-sodium). Regular tomato and vegetable juice (not low-sodium or reduced-sodium). Angie Fava. Olives. Fruits Canned fruit in a light or heavy syrup. Fried fruit. Fruit in cream or butter sauce. Meat and other protein foods Fatty cuts of meat. Ribs. Fried meat. Berniece Salines. Sausage. Bologna and other processed lunch meats. Salami. Fatback. Hotdogs. Bratwurst. Salted nuts and seeds. Canned beans with added salt. Canned or smoked fish. Whole eggs or egg yolks. Chicken or [place] with skin. Dairy Whole or 2% milk, cream, and half-and-half. Whole or full-fat cream cheese. Whole-fat or sweetened yogurt. Full-fat cheese. Nondairy  creamers. Whipped toppings. Processed cheese and cheese spreads. Fats and oils Butter. Stick margarine. Lard. Shortening. Ghee. Bacon fat. Tropical oils, such as coconut, palm kernel, or palm oil. Seasoning and other foods Salted popcorn and pretzels. Onion salt, garlic salt, seasoned salt, table salt, and sea salt. Worcestershire sauce. Tartar sauce. Barbecue sauce. Teriyaki sauce. Soy sauce, including reduced-sodium. Steak sauce. Canned and packaged gravies. Fish sauce. Oyster sauce. Cocktail sauce. Horseradish that you find on the shelf. Ketchup. Mustard. Meat flavorings and tenderizers. Bouillon cubes. Hot sauce and Tabasco sauce. Premade or packaged marinades. Premade or packaged taco seasonings. Relishes. Regular salad dressings. Where to find more information:  National Heart, Lung, and [place]: https://wilson-eaton.com/  American Heart Association: www.heart.org Summary  The DASH eating plan is a healthy eating plan that has  been shown to reduce high blood pressure (hypertension). It may also reduce your risk for type 2 diabetes, heart disease, and stroke.  With the DASH eating plan, you should limit salt (sodium) intake to 2,300 mg a day. If you have hypertension, you may need to reduce your sodium intake to 1,500 mg a day.  When on the DASH eating plan, aim to eat more fresh fruits and vegetables, whole grains, lean proteins, low-fat dairy, and heart-healthy fats.  Work with your health care provider or diet and nutrition specialist (dietitian) to adjust your eating plan to your individual calorie needs. This information is not intended to replace advice given to you by your health care provider. Make sure you discuss any questions you have with your health care provider. Document Revised: 07/30/2017 Document Reviewed: 08/10/2016 Elsevier Patient Education  2020 [employer].

## 2019-10-16 ENCOUNTER — Telehealth (INDEPENDENT_AMBULATORY_CARE_PROVIDER_SITE_OTHER): Payer: Medicare HMO | Admitting: Family Medicine

## 2019-10-16 ENCOUNTER — Encounter: Payer: Self-pay | Admitting: Family Medicine

## 2019-10-16 ENCOUNTER — Ambulatory Visit: Payer: Medicare HMO | Admitting: Family Medicine

## 2019-10-16 VITALS — BP 136/48 | HR 73 | Ht 60.0 in | Wt 221.0 lb

## 2019-10-16 DIAGNOSIS — F419 Anxiety disorder, unspecified: Secondary | ICD-10-CM

## 2019-10-16 DIAGNOSIS — Z7409 Other reduced mobility: Secondary | ICD-10-CM | POA: Diagnosis not present

## 2019-10-16 DIAGNOSIS — M797 Fibromyalgia: Secondary | ICD-10-CM | POA: Diagnosis not present

## 2019-10-16 NOTE — Progress Notes (Signed)
BP (!) 136/48    Pulse 73    Ht 5' (1.524 m)    Wt 221 lb (100.2 kg)    BMI 43.16 kg/m    Subjective:    Patient ID: Cynthia Jones, female    DOB: [birth year], 79 y.o.   MRN: [id number]  HPI: Cynthia Jones is a 79 y.o. female  Chief Complaint  Patient presents with   Anxiety   Pain    discuss pain management      This visit was completed via MyChart due to the restrictions of the COVID-19 pandemic. All issues as above were discussed and addressed. Physical exam was done as above through visual confirmation on MyChart. If it was felt that the patient should be evaluated in the office, they were directed there. The patient verbally consented to this visit.  Location of the patient: home  Location of the provider: work  Those involved with this call:   Provider: Roosvelt Maser, PA-C  CMA: Elton Sin, CMA  Front Desk/Registration: Harriet Pho   Time spent on call: 25 minutes with patient face to face via video conference. More than 50% of this time was spent in counseling and coordination of care. 10 minutes total spent in review of patient's record and preparation of their chart. I verified patient identity using two factors (patient name and date of birth). Patient consents verbally to being seen via telemedicine visit today.   Patient presenting today with multiple family members, who help provide some of the history today. Increased anxiety since moving from her long time home into her daughter's house. Has been having increased weakness, mobility not as good, and now having incontinence issues that seem to be worsening the past month or so. Also notes her chronic pain conditions seem to be exacerbating moods and anxiety. The abilify is seeming to be helpful in addition to cymbalta and xanax regimen. Pt requesting an increase in quantity for xanax as this does seem to help when she gets overwhelmed and into a panic episode. Following with Pain Management for her  chronic pain conditions but does not feel she is under good control on current regimen. Family feels that Palliative care would be a beneficial service for her at this time to help facilitate home care needs, counseling, etc.   Relevant past medical, surgical, family and social history reviewed and updated as indicated. Interim medical history since our last visit reviewed. Allergies and medications reviewed and updated.  Review of Systems  Per HPI unless specifically indicated above     Objective:    BP (!) 136/48    Pulse 73    Ht 5' (1.524 m)    Wt 221 lb (100.2 kg)    BMI 43.16 kg/m   Wt Readings from Last 3 Encounters:  10/16/19 221 lb (100.2 kg)  06/16/19 219 lb (99.3 kg)  04/12/19 220 lb (99.8 kg)    Physical Exam Vitals and nursing note reviewed.  Constitutional:      General: She is not in acute distress.    Appearance: Normal appearance.  HENT:     Head: Atraumatic.     Right Ear: External ear normal.     Left Ear: External ear normal.     Nose: Nose normal. No congestion.     Mouth/Throat:     Mouth: Mucous membranes are moist.     Pharynx: Oropharynx is clear. No posterior oropharyngeal erythema.  Eyes:     Extraocular Movements: Extraocular movements intact.  Conjunctiva/sclera: Conjunctivae normal.  Cardiovascular:     Comments: Unable to assess via virtual visit Pulmonary:     Effort: Pulmonary effort is normal. No respiratory distress.  Musculoskeletal:     Cervical back: Normal range of motion.     Comments: Sitting in chair, ROM at baseline  Skin:    General: Skin is dry.     Findings: No erythema.  Neurological:     Mental Status: She is alert. Mental status is at baseline.  Psychiatric:        Mood and Affect: Mood normal.        Thought Content: Thought content normal.        Judgment: Judgment normal.     Results for orders placed or performed in visit on 06/16/19  Microscopic Examination   URINE  Result Value Ref Range   RBC 0-2 0 -  2 /hpf   Epithelial Cells (non renal) 0-10 0 - 10 /hpf  CBC with Differential/Platelet  Result Value Ref Range   WBC 8.2 3.4 - 10.8 x10E3/uL   RBC 4.42 3.77 - 5.28 x10E6/uL   Hemoglobin 13.6 11.1 - 15.9 g/dL   Hematocrit 40.2 34.0 - 46.6 %   MCV 91 79 - 97 fL   MCH 30.8 26.6 - 33.0 pg   MCHC 33.8 31.5 - 35.7 g/dL   RDW 12.8 11.7 - 15.4 %   Platelets 347 150 - 450 x10E3/uL   Neutrophils 61 Not Estab. %   Lymphs 26 Not Estab. %   Monocytes 10 Not Estab. %   Eos 2 Not Estab. %   Basos 1 Not Estab. %   Neutrophils Absolute 5.0 1.4 - 7.0 x10E3/uL   Lymphocytes Absolute 2.1 0.7 - 3.1 x10E3/uL   Monocytes Absolute 0.8 0.1 - 0.9 x10E3/uL   EOS (ABSOLUTE) 0.1 0.0 - 0.4 x10E3/uL   Basophils Absolute 0.1 0.0 - 0.2 x10E3/uL   Immature Granulocytes 0 Not Estab. %   Immature Grans (Abs) 0.0 0.0 - 0.1 x10E3/uL  Comprehensive metabolic panel  Result Value Ref Range   Glucose 124 (H) 65 - 99 mg/dL   BUN 13 8 - 27 mg/dL   Creatinine, Ser 0.76 0.57 - 1.00 mg/dL   GFR calc non Af Amer 76 >59 mL/min/1.73   GFR calc Af Amer 88 >59 mL/min/1.73   BUN/Creatinine Ratio 17 12 - 28   Sodium 140 134 - 144 mmol/L   Potassium 4.7 3.5 - 5.2 mmol/L   Chloride 103 96 - 106 mmol/L   CO2 23 20 - 29 mmol/L   Calcium 10.2 8.7 - 10.3 mg/dL   Total Protein 7.3 6.0 - 8.5 g/dL   Albumin 4.4 3.7 - 4.7 g/dL   Globulin, Total 2.9 1.5 - 4.5 g/dL   Albumin/Globulin Ratio 1.5 1.2 - 2.2   Bilirubin Total 0.3 0.0 - 1.2 mg/dL   Alkaline Phosphatase 57 39 - 117 IU/L   AST 13 0 - 40 IU/L   ALT 12 0 - 32 IU/L  Lipid Panel w/o Chol/HDL Ratio  Result Value Ref Range   Cholesterol, Total 210 (H) 100 - 199 mg/dL   Triglycerides 163 (H) 0 - 149 mg/dL   HDL 70 >39 mg/dL   VLDL Cholesterol Cal 28 5 - 40 mg/dL   LDL Chol Calc (NIH) 112 (H) 0 - 99 mg/dL  TSH  Result Value Ref Range   TSH 2.070 0.450 - 4.500 uIU/mL  UA/M w/rflx Culture, Routine   Specimen: Urine   URINE  Result Value Ref Range   Specific Gravity, UA  1.020 1.005 - 1.030   pH, UA 5.0 5.0 - 7.5   Color, UA Yellow Yellow   Appearance Ur Clear Clear   Leukocytes,UA Negative Negative   Protein,UA Negative Negative/Trace   Glucose, UA Negative Negative   Ketones, UA Negative Negative   RBC, UA Trace (A) Negative   Bilirubin, UA Negative Negative   Urobilinogen, Ur 0.2 0.2 - 1.0 mg/dL   Nitrite, UA Negative Negative   Microscopic Examination See below:   HgB A1c  Result Value Ref Range   Hgb A1c MFr Bld 6.3 (H) 4.8 - 5.6 %   Est. average glucose Bld gHb Est-mCnc 134 mg/dL      Assessment & Plan:   Problem List Items Addressed This Visit      Other   Fibromyalgia (Chronic)    Along with other chronic pain conditions from arthritis. Continue per Pain Management and with cymbalta regimen      Anxiety - Primary    Will increase abilify and provide greater quantity of xanax for prn use (max of 2 tabs per day, and knows to use sparingly as needed). Continue cymbalta, will refer to CCM team for their input as well.       Relevant Medications   ALPRAZolam (XANAX) 0.25 MG tablet   Other Relevant Orders   Referral to Chronic Care Management Services    Other Visit Diagnoses    Declining mobility       Referral to Palliative care placed, and also to CCM team for their input   Relevant Orders   Referral to Chronic Care Management Services   Amb Referral to Palliative Care       Follow up plan: Return in about 4 weeks (around 11/13/2019) for anxiety f/u.

## 2019-10-17 MED ORDER — ARIPIPRAZOLE 5 MG PO TABS: 5.0000 mg | ORAL_TABLET | Freq: Every day | ORAL | 1 refills | Status: DC

## 2019-10-17 MED ORDER — ALPRAZOLAM 0.25 MG PO TABS: 0.2500 mg | ORAL_TABLET | Freq: Two times a day (BID) | ORAL | 2 refills | Status: DC | PRN

## 2019-10-18 ENCOUNTER — Telehealth: Payer: Self-pay | Admitting: Nurse Practitioner

## 2019-10-18 NOTE — Telephone Encounter (Signed)
Called patient's son Ray to schedule the Palliative Consult, no answer - left message with reason for call along with my contact information.

## 2019-10-18 NOTE — Telephone Encounter (Signed)
Spoke with patient regarding Palliative services and she was in agreement with this.  I have scheduled a Telephone Consult for 11/09/19 @ 3:30 PM

## 2019-10-26 ENCOUNTER — Ambulatory Visit: Payer: Self-pay | Admitting: General Practice

## 2019-10-26 ENCOUNTER — Encounter: Payer: Self-pay | Admitting: General Practice

## 2019-10-26 DIAGNOSIS — M545 Low back pain, unspecified: Secondary | ICD-10-CM

## 2019-10-26 DIAGNOSIS — G8929 Other chronic pain: Secondary | ICD-10-CM

## 2019-10-26 DIAGNOSIS — F3342 Major depressive disorder, recurrent, in full remission: Secondary | ICD-10-CM

## 2019-10-26 DIAGNOSIS — G894 Chronic pain syndrome: Secondary | ICD-10-CM

## 2019-10-26 DIAGNOSIS — I10 Essential (primary) hypertension: Secondary | ICD-10-CM

## 2019-10-26 DIAGNOSIS — F419 Anxiety disorder, unspecified: Secondary | ICD-10-CM

## 2019-10-26 NOTE — Assessment & Plan Note (Signed)
Will increase abilify and provide greater quantity of xanax for prn use (max of 2 tabs per day, and knows to use sparingly as needed). Continue cymbalta, will refer to CCM team for their input as well.

## 2019-10-26 NOTE — Progress Notes (Signed)
Pt has apt on 11/15/19 for f/u

## 2019-10-26 NOTE — Assessment & Plan Note (Signed)
Along with other chronic pain conditions from arthritis. Continue per Pain Management and with cymbalta regimen

## 2019-10-26 NOTE — Patient Instructions (Signed)
Visit Information  Goals Addressed            This Visit's Progress   . RNCM: I am having a hard time adjusting to moving in with my daughter       Current Barriers:  . Chronic Disease Management support, education, and care coordination needs related to HTN, Anxiety, Depression, and Chronic Pain . Grief and mourning over the loss of her husband and having to move in with her daughter (today 10/30/22 is her husbands death anniversary)  Clinical Goal(s) related to HTN, Anxiety, Depression, and Chronic Pain :  Over the next 120 days, patient will:  . Work with the care management team to address educational, disease management, and care coordination needs  . Begin or continue self health monitoring activities as directed today Measure and record blood pressure 4 times per week . Call provider office for new or worsened signs and symptoms Blood pressure findings outside established parameters and New or worsened symptom related to Depression, Anxiety or Chronic Pain . Call care management team with questions or concerns . Verbalize basic understanding of patient centered plan of care established today  Interventions related to HTN, Anxiety, Depression, and Chronic Pain :  . Evaluation of current treatment plans and patient's adherence to plan as established by provider . Assessed patient understanding of disease states . Assessed patient's education and care coordination needs . Provided disease specific education to patient  . Collaborated with appropriate clinical care team members regarding patient needs . Secure chat message sent to the pharmacist and the PA concerning the patients chronic unrelieved pain and decline in health. This is also the death anniversary of her husband, this is likely contributing to her pain and depression. Her daughter [name] says her mother is declining and she does not know how to help her. They are waiting for palliative referral . Advised the patient and the  patient daughter to take medications as prescribed.  Patient Self Care Activities related to HTN, Anxiety, Depression, and Chronic Pain :  . Patient is unable to independently self-manage chronic health conditions  Please see past updates related to this goal by clicking on the "Past Updates" button in the selected goal         Patient verbalizes understanding of instructions provided today.   The care management team will reach out to the patient again over the next 7 to 10 days days.   Alto Denver RN, MSN, CCM Community Care Coordinator [place]  [facility] Mobile: [phone]

## 2019-10-26 NOTE — Progress Notes (Signed)
This encounter was created in error - please disregard.

## 2019-10-26 NOTE — Chronic Care Management (AMB) (Signed)
Chronic Care Management   Follow Up Note   Nov 22, 2019 Name: Cynthia Jones MRN: [id number] DOB: September 30, 1940  Referred by: [name], PA-C Reason for referral : Chronic Care Management (Chronic pain and depression)   Cynthia Jones is a 79 y.o. year old female who is a primary care patient of [name], [place]. The CCM team was consulted for assistance with chronic disease management and care coordination needs.    Review of patient status, including review of consultants reports, relevant laboratory and other test results, and collaboration with appropriate care team members and the patient's provider was performed as part of comprehensive patient evaluation and provision of chronic care management services.    SDOH (Social Determinants of Health) assessments performed: Yes See Care Plan activities for detailed interventions related to [facility])     Outpatient Encounter Medications as of 2019-11-22  Medication Sig  . ALPRAZolam (XANAX) 0.25 MG tablet Take 1 tablet (0.25 mg total) by mouth 2 (two) times daily as needed. for anxiety  . ARIPiprazole (ABILIFY) 5 MG tablet Take 1 tablet (5 mg total) by mouth daily.  . carvedilol (COREG) 6.25 MG tablet Take 1 tablet (6.25 mg total) by mouth 2 (two) times daily with a meal.  . diclofenac sodium (VOLTAREN) 1 % GEL Apply 2 g topically 4 (four) times daily.  . DULoxetine (CYMBALTA) 60 MG capsule TAKE 1 CAPSULE BY MOUTH EVERY DAY  . esomeprazole (NEXIUM) 40 MG capsule Take 40 mg by mouth daily at 12 noon.  [name] levothyroxine (SYNTHROID) 50 MCG tablet Take 1 tablet (50 mcg total) by mouth daily.  [name] losartan (COZAAR) 100 MG tablet TAKE 1 TABLET BY MOUTH EVERY DAY  . nystatin (MYCOSTATIN/NYSTOP) powder APPLY TO AFFECTED AREA 3 TIMES A DAY  . oxyCODONE (OXY IR/ROXICODONE) 5 MG immediate release tablet Take 1 tablet (5 mg total) by mouth every 8 (eight) hours as needed for severe pain. Must last 30 days  . [START ON 11/06/2019]  oxyCODONE (OXY IR/ROXICODONE) 5 MG immediate release tablet Take 1 tablet (5 mg total) by mouth every 8 (eight) hours as needed for severe pain. Must last 30 days (Patient not taking: Reported on 10/16/2019)  . [START ON 12/06/2019] oxyCODONE (OXY IR/ROXICODONE) 5 MG immediate release tablet Take 1 tablet (5 mg total) by mouth every 8 (eight) hours as needed for severe pain. Must last 30 days (Patient not taking: Reported on 10/16/2019)   No facility-administered encounter medications on file as of 2019/11/22.     Objective:   Goals Addressed            This Visit's Progress   . RNCM: I am having a hard time adjusting to moving in with my daughter       Current Barriers:  . Chronic Disease Management support, education, and care coordination needs related to HTN, Anxiety, Depression, and Chronic Pain . Grief and mourning over the loss of her husband and having to move in with her daughter (today 21-Nov-2022 is her husbands death anniversary)  Clinical Goal(s) related to HTN, Anxiety, Depression, and Chronic Pain :  Over the next 120 days, patient will:  . Work with the care management team to address educational, disease management, and care coordination needs  . Begin or continue self health monitoring activities as directed today Measure and record blood pressure 4 times per week . Call provider office for new or worsened signs and symptoms Blood pressure findings outside established parameters and New or worsened symptom related to  Depression, Anxiety or Chronic Pain . Call care management team with questions or concerns . Verbalize basic understanding of patient centered plan of care established today  Interventions related to HTN, Anxiety, Depression, and Chronic Pain :  . Evaluation of current treatment plans and patient's adherence to plan as established by provider . Assessed patient understanding of disease states . Assessed patient's education and care coordination needs . Provided  disease specific education to patient  . Collaborated with appropriate clinical care team members regarding patient needs . Secure chat message sent to the pharmacist and the PA concerning the patients chronic unrelieved pain and decline in health. This is also the death anniversary of her husband, this is likely contributing to her pain and depression. Her daughter [name] says her mother is declining and she does not know how to help her. They are waiting for palliative referral . Advised the patient and the patient daughter to take medications as prescribed.  Patient Self Care Activities related to HTN, Anxiety, Depression, and Chronic Pain :  . Patient is unable to independently self-manage chronic health conditions  Please see past updates related to this goal by clicking on the "Past Updates" button in the selected goal          Plan:   The care management team will reach out to the patient again over the next 7 to 10 days.   [name] RN, MSN, [facility] Mobile: [phone]

## 2019-10-27 ENCOUNTER — Encounter: Payer: Self-pay | Admitting: Family Medicine

## 2019-10-27 ENCOUNTER — Other Ambulatory Visit: Payer: Self-pay

## 2019-10-27 ENCOUNTER — Ambulatory Visit: Payer: Self-pay | Admitting: General Practice

## 2019-10-27 ENCOUNTER — Ambulatory Visit (INDEPENDENT_AMBULATORY_CARE_PROVIDER_SITE_OTHER): Payer: Medicare HMO | Admitting: Family Medicine

## 2019-10-27 ENCOUNTER — Telehealth: Payer: Self-pay | Admitting: Family Medicine

## 2019-10-27 VITALS — BP 171/95 | HR 67 | Temp 97.8°F | Wt 223.0 lb

## 2019-10-27 DIAGNOSIS — F3342 Major depressive disorder, recurrent, in full remission: Secondary | ICD-10-CM

## 2019-10-27 DIAGNOSIS — G894 Chronic pain syndrome: Secondary | ICD-10-CM | POA: Diagnosis not present

## 2019-10-27 DIAGNOSIS — R531 Weakness: Secondary | ICD-10-CM | POA: Diagnosis not present

## 2019-10-27 DIAGNOSIS — M797 Fibromyalgia: Secondary | ICD-10-CM

## 2019-10-27 DIAGNOSIS — F419 Anxiety disorder, unspecified: Secondary | ICD-10-CM

## 2019-10-27 MED ORDER — KETOROLAC TROMETHAMINE 60 MG/2ML IM SOLN
60.0000 mg | Freq: Once | INTRAMUSCULAR | Status: AC
  Administered 2019-10-27: 60 mg via INTRAMUSCULAR

## 2019-10-27 MED ORDER — GABAPENTIN 300 MG PO CAPS: 300.0000 mg | ORAL_CAPSULE | Freq: Three times a day (TID) | ORAL | 0 refills | Status: DC

## 2019-10-27 MED ORDER — CYCLOBENZAPRINE HCL 5 MG PO TABS: 5.0000 mg | ORAL_TABLET | Freq: Three times a day (TID) | ORAL | 2 refills | Status: DC | PRN

## 2019-10-27 NOTE — Progress Notes (Signed)
BP (!) 171/95   Pulse 67   Temp 97.8 F (36.6 C) (Oral)   Wt 223 lb (101.2 kg)   SpO2 97%   BMI 43.55 kg/m    Subjective:    Patient ID: Cynthia Jones, female    DOB: 1940-09-02, 79 y.o.   MRN: [id number]  HPI: Cynthia Jones is a 79 y.o. female  Chief Complaint  Patient presents with  . Pain    neck, upper back running down to the shoulder. right hand and left knee x the last 3 days   [place], [place], [place] area -   Patient presenting today with her daughter, who is now a primary caregiver for patient and helps provide history. Patient's family has noticed significant decline the past month or so in mobility, moods and anxiety due to stressors of grief, having to move in with daughter an hour away, and chronic worsening pain conditions. Currently awaiting Palliative Care consultation per family request to discuss support services and ongoing care. Followed by [facility] for pain mgmt but does not feel her pain has been well controlled with current regimen of oxycodone 5 mg TID prn. Has diclofenac gel at home but does not use often. On cymbalta for fibromyalgia. Has tried lyrica in the past but recalls GI side effects. Was on gabapentin for a short time recently and notes it didn't make a difference. Per record review was on 300 mg daily at that time. The pain is dissuading her to be as mobile as she used to be and has become more deconditioned during this time. Some generalized weakness, worsened posture changes, and mood issues noted by family members.   Depression screen [facility] 2/9 10/16/2019 10/13/2019 06/16/2019  Decreased Interest 2 1 1   Down, Depressed, Hopeless 2 1 1   PHQ - 2 Score 4 2 2   Altered sleeping 3 0 0  Tired, decreased energy 3 1 1   Change in appetite 2 0 1  Feeling bad or failure about yourself  1 0 0  Trouble concentrating 2 0 0  Moving slowly or fidgety/restless 3 0 0  Suicidal thoughts 0 0 0  PHQ-9 Score 18 3 4   Difficult doing work/chores Somewhat  difficult - Not difficult at all  Some recent data might be hidden   GAD 7 : Generalized Anxiety Score 10/16/2019 06/16/2019  Nervous, Anxious, on Edge 3 2  Control/stop worrying 3 1  Worry too much - different things 3 2  Trouble relaxing 3 2  Restless 3 0  Easily annoyed or irritable 3 1  Afraid - awful might happen 2 0  Total GAD 7 Score 20 8  Anxiety Difficulty Very difficult Somewhat difficult   Relevant past medical, surgical, family and social history reviewed and updated as indicated. Interim medical history since our last visit reviewed. Allergies and medications reviewed and updated.  Review of Systems  Per HPI unless specifically indicated above     Objective:    BP (!) 171/95   Pulse 67   Temp 97.8 F (36.6 C) (Oral)   Wt 223 lb (101.2 kg)   SpO2 97%   BMI 43.55 kg/m   Wt Readings from Last 3 Encounters:  10/27/19 223 lb (101.2 kg)  10/16/19 221 lb (100.2 kg)  06/16/19 219 lb (99.3 kg)    Physical Exam Vitals and nursing note reviewed.  Constitutional:      Appearance: Normal appearance. She is not ill-appearing.  HENT:     Head: Atraumatic.  Eyes:     Extraocular Movements: Extraocular movements intact.     Conjunctiva/sclera: Conjunctivae normal.  Cardiovascular:     Rate and Rhythm: Normal rate and regular rhythm.     Heart sounds: Normal heart sounds.  Pulmonary:     Effort: Pulmonary effort is normal.     Breath sounds: Normal breath sounds.  Musculoskeletal:     Cervical back: Normal range of motion and neck supple.     Comments: Slow, antalgic gait.  Grip strength reduced b/l UEs, left worse than right  Skin:    General: Skin is warm and dry.  Neurological:     Mental Status: She is alert and oriented to person, place, and time.  Psychiatric:        Judgment: Judgment normal.     Comments: Tearful at times during interview    Results for orders placed or performed in visit on 06/16/19  Microscopic Examination   URINE  Result Value  Ref Range   RBC 0-2 0 - 2 /hpf   Epithelial Cells (non renal) 0-10 0 - 10 /hpf  CBC with Differential/Platelet  Result Value Ref Range   WBC 8.2 3.4 - 10.8 x10E3/uL   RBC 4.42 3.77 - 5.28 x10E6/uL   Hemoglobin 13.6 11.1 - 15.9 g/dL   Hematocrit 40.2 34.0 - 46.6 %   MCV 91 79 - 97 fL   MCH 30.8 26.6 - 33.0 pg   MCHC 33.8 31.5 - 35.7 g/dL   RDW 12.8 11.7 - 15.4 %   Platelets 347 150 - 450 x10E3/uL   Neutrophils 61 Not Estab. %   Lymphs 26 Not Estab. %   Monocytes 10 Not Estab. %   Eos 2 Not Estab. %   Basos 1 Not Estab. %   Neutrophils Absolute 5.0 1.4 - 7.0 x10E3/uL   Lymphocytes Absolute 2.1 0.7 - 3.1 x10E3/uL   Monocytes Absolute 0.8 0.1 - 0.9 x10E3/uL   EOS (ABSOLUTE) 0.1 0.0 - 0.4 x10E3/uL   Basophils Absolute 0.1 0.0 - 0.2 x10E3/uL   Immature Granulocytes 0 Not Estab. %   Immature Grans (Abs) 0.0 0.0 - 0.1 x10E3/uL  Comprehensive metabolic panel  Result Value Ref Range   Glucose 124 (H) 65 - 99 mg/dL   BUN 13 8 - 27 mg/dL   Creatinine, Ser 0.76 0.57 - 1.00 mg/dL   GFR calc non Af Amer 76 >59 mL/min/1.73   GFR calc Af Amer 88 >59 mL/min/1.73   BUN/Creatinine Ratio 17 12 - 28   Sodium 140 134 - 144 mmol/L   Potassium 4.7 3.5 - 5.2 mmol/L   Chloride 103 96 - 106 mmol/L   CO2 23 20 - 29 mmol/L   Calcium 10.2 8.7 - 10.3 mg/dL   Total Protein 7.3 6.0 - 8.5 g/dL   Albumin 4.4 3.7 - 4.7 g/dL   Globulin, Total 2.9 1.5 - 4.5 g/dL   Albumin/Globulin Ratio 1.5 1.2 - 2.2   Bilirubin Total 0.3 0.0 - 1.2 mg/dL   Alkaline Phosphatase 57 39 - 117 IU/L   AST 13 0 - 40 IU/L   ALT 12 0 - 32 IU/L  Lipid Panel w/o Chol/HDL Ratio  Result Value Ref Range   Cholesterol, Total 210 (H) 100 - 199 mg/dL   Triglycerides 163 (H) 0 - 149 mg/dL   HDL 70 >39 mg/dL   VLDL Cholesterol Cal 28 5 - 40 mg/dL   LDL Chol Calc (NIH) 112 (H) 0 - 99 mg/dL  TSH  Result Value  Ref Range   TSH 2.070 0.450 - 4.500 uIU/mL  UA/M w/rflx Culture, Routine   Specimen: Urine   URINE  Result Value Ref Range    Specific Gravity, UA 1.020 1.005 - 1.030   pH, UA 5.0 5.0 - 7.5   Color, UA Yellow Yellow   Appearance Ur Clear Clear   Leukocytes,UA Negative Negative   Protein,UA Negative Negative/Trace   Glucose, UA Negative Negative   Ketones, UA Negative Negative   RBC, UA Trace (A) Negative   Bilirubin, UA Negative Negative   Urobilinogen, Ur 0.2 0.2 - 1.0 mg/dL   Nitrite, UA Negative Negative   Microscopic Examination See below:   HgB A1c  Result Value Ref Range   Hgb A1c MFr Bld 6.3 (H) 4.8 - 5.6 %   Est. average glucose Bld gHb Est-mCnc 134 mg/dL      Assessment & Plan:   Problem List Items Addressed This Visit      Other   Fibromyalgia - Primary (Chronic)    Poorly controlled. Re-add gabapentin at higher dose, IM toradol given (NSAID intolerance is based on GI side effects), flexeril, continued oxycodone regimen      Relevant Medications   gabapentin (NEURONTIN) 300 MG capsule   cyclobenzaprine (FLEXERIL) 5 MG tablet   Other Relevant Orders   Ambulatory referral to Pain Clinic   Chronic pain syndrome (Chronic)    Poor pain control currently. Will add back gabapentin at higher dose, trial flexeril prn, continue oxycodone per Pain Clinic. Referral to Pain Clinic placed for second opinion per patient request additionally.       Relevant Medications   gabapentin (NEURONTIN) 300 MG capsule   cyclobenzaprine (FLEXERIL) 5 MG tablet   Other Relevant Orders   Ambulatory referral to Pain Clinic   Anxiety    Exacerbated by grief, pain, and adjustments/changes. Will work on pain control, and CCM Child psychotherapist has been working with her on counseling and resources. Awaiting Palliative consult additionally. Continue current regimen with close monitoring      Depression    Exacerbated by grief, pain, and adjustments/changes. Will work on pain control, and CCM Child psychotherapist has been working with her on counseling and resources. Awaiting Palliative consult additionally       Other Visit  Diagnoses    Weakness       Suspect due to pain and depressed moods, but will check U/A and CBC to r/o obvious infection additionally. Tx as needed   Relevant Orders   CBC With Differential/Platelet   UA/M w/rflx Culture, Routine       Follow up plan: Return in about 4 weeks (around 11/24/2019) for Pain f/u - can be virtual.

## 2019-10-27 NOTE — Assessment & Plan Note (Signed)
Exacerbated by grief, pain, and adjustments/changes. Will work on pain control, and CCM Child psychotherapist has been working with her on counseling and resources. Awaiting Palliative consult additionally

## 2019-10-27 NOTE — Chronic Care Management (AMB) (Signed)
Chronic Care Management   Follow Up Note   10/27/2019 Name: Cynthia Jones MRN: [id number] DOB: 1941/02/03  Referred by: [name], PA-C Reason for referral : Chronic Care Management (Chronic pain unrelieved/Depression/Anxiety )   Cynthia Jones is a 79 y.o. year old female who is a primary care patient of [name], [place]. The CCM team was consulted for assistance with chronic disease management and care coordination needs.    Review of patient status, including review of consultants reports, relevant laboratory and other test results, and collaboration with appropriate care team members and the patient's provider was performed as part of comprehensive patient evaluation and provision of chronic care management services.    SDOH (Social Determinants of Health) assessments performed: Yes See Care Plan activities for detailed interventions related to [facility])     Outpatient Encounter Medications as of 10/27/2019  Medication Sig  . ALPRAZolam (XANAX) 0.25 MG tablet Take 1 tablet (0.25 mg total) by mouth 2 (two) times daily as needed. for anxiety  . ARIPiprazole (ABILIFY) 5 MG tablet Take 1 tablet (5 mg total) by mouth daily.  . carvedilol (COREG) 6.25 MG tablet Take 1 tablet (6.25 mg total) by mouth 2 (two) times daily with a meal.  . diclofenac sodium (VOLTAREN) 1 % GEL Apply 2 g topically 4 (four) times daily.  . DULoxetine (CYMBALTA) 60 MG capsule TAKE 1 CAPSULE BY MOUTH EVERY DAY  . esomeprazole (NEXIUM) 40 MG capsule Take 40 mg by mouth daily at 12 noon.  [name] levothyroxine (SYNTHROID) 50 MCG tablet Take 1 tablet (50 mcg total) by mouth daily.  [name] losartan (COZAAR) 100 MG tablet TAKE 1 TABLET BY MOUTH EVERY DAY  . nystatin (MYCOSTATIN/NYSTOP) powder APPLY TO AFFECTED AREA 3 TIMES A DAY  . oxyCODONE (OXY IR/ROXICODONE) 5 MG immediate release tablet Take 1 tablet (5 mg total) by mouth every 8 (eight) hours as needed for severe pain. Must last 30 days  .  [START ON 11/06/2019] oxyCODONE (OXY IR/ROXICODONE) 5 MG immediate release tablet Take 1 tablet (5 mg total) by mouth every 8 (eight) hours as needed for severe pain. Must last 30 days (Patient not taking: Reported on 10/16/2019)  . [START ON 12/06/2019] oxyCODONE (OXY IR/ROXICODONE) 5 MG immediate release tablet Take 1 tablet (5 mg total) by mouth every 8 (eight) hours as needed for severe pain. Must last 30 days (Patient not taking: Reported on 10/16/2019)   No facility-administered encounter medications on file as of 10/27/2019.     Objective:   Goals Addressed            This Visit's Progress   . RNCM: Mom is having a really hard time managing her pain and discomfort       CARE PLAN ENTRY (see longtitudinal plan of care for additional care plan information)  Current Barriers:  [name] Knowledge Deficits related to how to effectively manage pain as evidence by unresolved pain in patient with chronic pain syndrome, depression and anxiety . Unresolved grief, depression and anxiety as evidence of losing her spouse and having to move in with her daughter  Nurse Case Manager Clinical Goal(s):  [name] Over the next 60 days, patient will verbalize understanding of plan for managing pain effectively, having decreased episodes of depression and anxiety . Over the next 60 days, patient will work with [facility] and pcp to address needs related to pain relief measures and dealing with her depression and anxiety  . Over the next 60 days, patient will demonstrate  a decrease in pain exacerbations as evidenced by controlled pain . Over the next 60 days, patient will attend all scheduled medical appointments: [place] office staff to make an appointment for the patient to follow up with pcp  Interventions:  . Evaluation of current treatment plan related to pain control and patient's adherence to plan as established by provider. . Advised patient to call the office to get an appointment after the office was unable to get in  touch with her . Provided education to patient re: if pain is unresolved and worse to go to the emergency room to be evaluated and treated . Reviewed medications with patient and discussed compliance- the patient is taking medications as prescribed  . Collaborated with pcp and pharmacist  regarding pain control methods . Provided patient with pain educational materials related to non-pharmacological ways to manage pain and discomfort  . Reviewed scheduled/upcoming provider appointments including: staff to assist in making appointment to come in to be evaluated by the pcp . Pharmacy consult for support and recommendations  . Social worker support for effective management of grief and depression  Patient Self Care Activities:  . Patient verbalizes understanding of plan to work with the interdisciplinary team to help patient manage her pain, depression and anxiety more effectively . Self administers medications as prescribed . Attends all scheduled provider appointments . Calls provider office for new concerns or questions . Unable to independently manage pain, depression and anxiety related to chronic conditions . Unable to perform IADLs independently  Initial goal documentation         Plan:   The care management team will reach out to the patient again over the next 7 to 10 days.    [name] RN, MSN, [facility] Mobile: [phone]

## 2019-10-27 NOTE — Patient Instructions (Signed)
Visit Information  Goals Addressed            This Visit's Progress   . RNCM: Mom is having a really hard time managing her pain and discomfort       CARE PLAN ENTRY (see longtitudinal plan of care for additional care plan information)  Current Barriers:  [name] Knowledge Deficits related to how to effectively manage pain as evidence by unresolved pain in patient with chronic pain syndrome, depression and anxiety . Unresolved grief, depression and anxiety as evidence of losing her spouse and having to move in with her daughter  Nurse Case Manager Clinical Goal(s):  [name] Over the next 60 days, patient will verbalize understanding of plan for managing pain effectively, having decreased episodes of depression and anxiety . Over the next 60 days, patient will work with [facility] and pcp to address needs related to pain relief measures and dealing with her depression and anxiety  . Over the next 60 days, patient will demonstrate a decrease in pain exacerbations as evidenced by controlled pain . Over the next 60 days, patient will attend all scheduled medical appointments: [place] office staff to make an appointment for the patient to follow up with pcp  Interventions:  . Evaluation of current treatment plan related to pain control and patient's adherence to plan as established by provider. . Advised patient to call the office to get an appointment after the office was unable to get in touch with her . Provided education to patient re: if pain is unresolved and worse to go to the emergency room to be evaluated and treated . Reviewed medications with patient and discussed compliance- the patient is taking medications as prescribed  . Collaborated with pcp and pharmacist  regarding pain control methods . Provided patient with pain educational materials related to non-pharmacological ways to manage pain and discomfort  . Reviewed scheduled/upcoming provider appointments including: staff to assist in making  appointment to come in to be evaluated by the pcp . Pharmacy consult for support and recommendations  . Social worker support for effective management of grief and depression  Patient Self Care Activities:  . Patient verbalizes understanding of plan to work with the interdisciplinary team to help patient manage her pain, depression and anxiety more effectively . Self administers medications as prescribed . Attends all scheduled provider appointments . Calls provider office for new concerns or questions . Unable to independently manage pain, depression and anxiety related to chronic conditions . Unable to perform IADLs independently  Initial goal documentation        Patient verbalizes understanding of instructions provided today.   The care management team will reach out to the patient again over the next 7 to 10 days.   Alto Denver RN, MSN, CCM Community Care Coordinator [place]  [facility] Mobile: [phone]   Mindfulness-Based Stress Reduction Mindfulness-based stress reduction (MBSR) is a program that helps people learn to practice mindfulness. Mindfulness is the practice of intentionally paying attention to the present moment. It can be learned and practiced through techniques such as education, breathing exercises, meditation, and yoga. MBSR includes several mindfulness techniques in one program. MBSR works best when you understand the treatment, are willing to try new things, and can commit to spending time practicing what you learn. MBSR training may include learning about:  How your emotions, thoughts, and reactions affect your body.  New ways to respond to things that cause negative thoughts to start (triggers).  How to  notice your thoughts and let go of them.  Practicing awareness of everyday things that you normally do without thinking.  The techniques and goals of different types of meditation. What are the benefits of  MBSR? MBSR can have many benefits, which include helping you to:  Develop self-awareness. This refers to knowing and understanding yourself.  Learn skills and attitudes that help you to participate in your own health care.  Learn new ways to care for yourself.  Be more accepting about how things are, and let things go.  Be less judgmental and approach things with an open mind.  Be patient with yourself and trust yourself more. MBSR has also been shown to:  Reduce negative emotions, such as depression and anxiety.  Improve memory and focus.  Change how you sense and approach pain.  Boost your body's ability to fight infections.  Help you connect better with other people.  Improve your sense of well-being. Follow these instructions at home:   Find a local in-person or online MBSR program.  Set aside some time regularly for mindfulness practice.  Find a mindfulness practice that works best for you. This may include one or more of the following: ? Meditation. Meditation involves focusing your mind on a certain thought or activity. ? Breathing awareness exercises. These help you to stay present by focusing on your breath. ? Body scan. For this practice, you lie down and pay attention to each part of your body from head to toe. You can identify tension and soreness and intentionally relax parts of your body. ? Yoga. Yoga involves stretching and breathing, and it can improve your ability to move and be flexible. It can also provide an experience of testing your body's limits, which can help you release stress. ? Mindful eating. This way of eating involves focusing on the taste, texture, color, and smell of each bite of food. Because this slows down eating and helps you feel full sooner, it can be an important part of a weight-loss plan.  Find a podcast or recording that provides guidance for breathing awareness, body scan, or meditation exercises. You can listen to these any time  when you have a free moment to rest without distractions.  Follow your treatment plan as told by your health care provider. This may include taking regular medicines and making changes to your diet or lifestyle as recommended. How to practice mindfulness To do a basic awareness exercise:  Find a comfortable place to sit.  Pay attention to the present moment. Observe your thoughts, feelings, and surroundings just as they are.  Avoid placing judgment on yourself, your feelings, or your surroundings. Make note of any judgment that comes up, and let it go.  Your mind may wander, and that is okay. Make note of when your thoughts drift, and return your attention to the present moment. To do basic mindfulness meditation:  Find a comfortable place to sit. This may include a stable chair or a firm floor cushion. ? Sit upright with your back straight. Let your arms fall next to your side with your hands resting on your legs. ? If sitting in a chair, rest your feet flat on the floor. ? If sitting on a cushion, cross your legs in front of you.  Keep your head in a neutral position with your chin dropped slightly. Relax your jaw and rest the tip of your tongue on the roof of your mouth. Drop your gaze to the floor. You can close your eyes  if you like.  Breathe normally and pay attention to your breath. Feel the air moving in and out of your nose. Feel your belly expanding and relaxing with each breath.  Your mind may wander, and that is okay. Make note of when your thoughts drift, and return your attention to your breath.  Avoid placing judgment on yourself, your feelings, or your surroundings. Make note of any judgment or feelings that come up, let them go, and bring your attention back to your breath.  When you are ready, lift your gaze or open your eyes. Pay attention to how your body feels after the meditation. Where to find more information You can find more information about MBSR from:  Your  health care provider.  Community-based meditation centers or programs.  Programs offered near you. Summary  Mindfulness-based stress reduction (MBSR) is a program that teaches you how to intentionally pay attention to the present moment. It is used with other treatments to help you cope better with daily stress, emotions, and pain.  MBSR focuses on developing self-awareness, which allows you to respond to life stress without judgment or negative emotions.  MBSR programs may involve learning different mindfulness practices, such as breathing exercises, meditation, yoga, body scan, or mindful eating. Find a mindfulness practice that works best for you, and set aside time for it on a regular basis. This information is not intended to replace advice given to you by your health care provider. Make sure you discuss any questions you have with your health care provider. Document Revised: 07/30/2017 Document Reviewed: 12/24/2016 Elsevier Patient Education  [place].  Chronic Pain, Adult Chronic pain is a type of pain that lasts or keeps coming back (recurs) for at least six months. You may have chronic headaches, abdominal pain, or body pain. Chronic pain may be related to an illness, such as fibromyalgia or complex regional pain syndrome. Sometimes the cause of chronic pain is not known. Chronic pain can make it hard for you to do daily activities. If not treated, chronic pain can lead to other health problems, including anxiety and depression. Treatment depends on the cause and severity of your pain. You may need to work with a pain specialist to come up with a treatment plan. The plan may include medicine, counseling, and physical therapy. Many people benefit from a combination of two or more types of treatment to control their pain. Follow these instructions at home: Lifestyle Consider keeping a pain diary to share with your health care providers. Consider talking with a mental health  care provider (psychologist) about how to cope with chronic pain. Consider joining a chronic pain support group. Try to control or lower your stress levels. Talk to your health care provider about strategies to do this. General instructions  Take over-the-counter and prescription medicines only as told by your health care provider. Follow your treatment plan as told by your health care provider. This may include: Gentle, regular exercise. Eating a healthy diet that includes foods such as vegetables, fruits, fish, and lean meats. Cognitive or behavioral therapy. Working with a Community education officer. Meditation or yoga. Acupuncture or massage therapy. Aroma, color, light, or sound therapy. Local electrical stimulation. Shots (injections) of numbing or pain-relieving medicines into the spine or the area of pain. Check your pain level as told by your health care provider. Ask your health care provider if you should use a pain scale. Learn as much as you can about how to manage your chronic pain. Ask your  health care provider if an intensive pain rehabilitation program or a chronic pain specialist would be helpful. Keep all follow-up visits as told by your health care provider. This is important. Contact a health care provider if: Your pain gets worse. You have new pain. You have trouble sleeping. You have trouble doing your normal activities. Your pain is not controlled with treatment. Your have side effects from pain medicine. You feel weak. Get help right away if: You lose feeling or have numbness in your body. You lose control of bowel or bladder function. Your pain suddenly gets much worse. You develop shaking or chills. You develop confusion. You develop chest pain. You have trouble breathing or shortness of breath. You pass out. You have thoughts about hurting yourself or others. This information is not intended to replace advice given to you by your health care provider. Make  sure you discuss any questions you have with your health care provider. Document Revised: 07/30/2017 Document Reviewed: 02/04/2016 Elsevier Patient Education  2020 [employer].

## 2019-10-27 NOTE — Assessment & Plan Note (Signed)
Exacerbated by grief, pain, and adjustments/changes. Will work on pain control, and CCM Child psychotherapist has been working with her on counseling and resources. Awaiting Palliative consult additionally. Continue current regimen with close monitoring

## 2019-10-27 NOTE — Assessment & Plan Note (Signed)
Poor pain control currently. Will add back gabapentin at higher dose, trial flexeril prn, continue oxycodone per Pain Clinic. Referral to Pain Clinic placed for second opinion per patient request additionally.

## 2019-10-27 NOTE — Telephone Encounter (Signed)
Called pt's daughter christine to schedule pt an appt asap per pam tate and rachel lane, no answer, vm full.

## 2019-10-27 NOTE — Assessment & Plan Note (Signed)
Poorly controlled. Re-add gabapentin at higher dose, IM toradol given (NSAID intolerance is based on GI side effects), flexeril, continued oxycodone regimen

## 2019-10-28 LAB — CBC WITH DIFFERENTIAL/PLATELET
Hematocrit: 38.9 % (ref 34.0–46.6)
Hemoglobin: 13.2 g/dL (ref 11.1–15.9)
Lymphocytes Absolute: 2.5 10*3/uL (ref 0.7–3.1)
Lymphs: 30 %
MCH: 31.7 pg (ref 26.6–33.0)
MCHC: 33.9 g/dL (ref 31.5–35.7)
MCV: 93 fL (ref 79–97)
MID (Absolute): 1.1 10*3/uL (ref 0.1–1.6)
MID: 13 %
Neutrophils Absolute: 4.8 10*3/uL (ref 1.4–7.0)
Neutrophils: 57 %
Platelets: 280 10*3/uL (ref 150–450)
RBC: 4.17 x10E6/uL (ref 3.77–5.28)
RDW: 14.1 % (ref 11.7–15.4)
WBC: 8.4 10*3/uL (ref 3.4–10.8)

## 2019-10-28 LAB — UA/M W/RFLX CULTURE, ROUTINE
Bilirubin, UA: NEGATIVE
Glucose, UA: NEGATIVE
Ketones, UA: NEGATIVE
Leukocytes,UA: NEGATIVE
Nitrite, UA: NEGATIVE
Protein,UA: NEGATIVE
RBC, UA: NEGATIVE
Specific Gravity, UA: 1.015 (ref 1.005–1.030)
Urobilinogen, Ur: 0.2 mg/dL (ref 0.2–1.0)
pH, UA: 5 (ref 5.0–7.5)

## 2019-10-30 ENCOUNTER — Ambulatory Visit (INDEPENDENT_AMBULATORY_CARE_PROVIDER_SITE_OTHER): Payer: Medicare HMO | Admitting: Licensed Clinical Social Worker

## 2019-10-30 ENCOUNTER — Telehealth: Payer: Self-pay | Admitting: General Practice

## 2019-10-30 ENCOUNTER — Telehealth: Payer: Self-pay | Admitting: Family Medicine

## 2019-10-30 ENCOUNTER — Ambulatory Visit: Payer: Self-pay | Admitting: General Practice

## 2019-10-30 DIAGNOSIS — F419 Anxiety disorder, unspecified: Secondary | ICD-10-CM

## 2019-10-30 DIAGNOSIS — I10 Essential (primary) hypertension: Secondary | ICD-10-CM | POA: Diagnosis not present

## 2019-10-30 DIAGNOSIS — F3342 Major depressive disorder, recurrent, in full remission: Secondary | ICD-10-CM

## 2019-10-30 DIAGNOSIS — F4321 Adjustment disorder with depressed mood: Secondary | ICD-10-CM | POA: Diagnosis not present

## 2019-10-30 DIAGNOSIS — G894 Chronic pain syndrome: Secondary | ICD-10-CM

## 2019-10-30 DIAGNOSIS — M797 Fibromyalgia: Secondary | ICD-10-CM

## 2019-10-30 MED ORDER — CYCLOBENZAPRINE HCL 5 MG PO TABS: 2.5000 mg | ORAL_TABLET | Freq: Three times a day (TID) | ORAL | 1 refills | Status: DC | PRN

## 2019-10-30 MED ORDER — PREGABALIN 50 MG PO CAPS: 50.0000 mg | ORAL_CAPSULE | Freq: Three times a day (TID) | ORAL | 0 refills | Status: DC

## 2019-10-30 NOTE — Telephone Encounter (Signed)
Per CCM nurse and her conversation with pt and her immediate family this morning, they are requesting gabapentin be switched to lyrica. At recent OV last week pt reported GI side effects with lyrica in the past but they have discussed it over weekend and do wish to be on lyrica rather than the gabapentin that was sent in. They were also unable to get the flexeril for some reason at pharmacy. Re-wrote script to reduce dose to 1/2 tab dosing, will see if that will go through. They have f/u in 1 month but should come in sooner if still having issues.

## 2019-10-30 NOTE — Chronic Care Management (AMB) (Signed)
Chronic Care Management    Clinical Social Work Follow Up Note  10/30/2019 Name: Cynthia Jones MRN: [id number] DOB: Jun 29, 1941  Cynthia Jones is a 79 y.o. year old female who is a primary care patient of [name], [place]. The CCM team was consulted for assistance with Mental Health Counseling and Resources and Grief Counseling.   Review of patient status, including review of consultants reports, other relevant assessments, and collaboration with appropriate care team members and the patient's provider was performed as part of comprehensive patient evaluation and provision of chronic care management services.    SDOH (Social Determinants of Health) assessments performed: Yes    Advanced Directives Status: <no information> See Care Plan for related entries.   Outpatient Encounter Medications as of 10/30/2019  Medication Sig  . ALPRAZolam (XANAX) 0.25 MG tablet Take 1 tablet (0.25 mg total) by mouth 2 (two) times daily as needed. for anxiety  . ARIPiprazole (ABILIFY) 5 MG tablet Take 1 tablet (5 mg total) by mouth daily.  . carvedilol (COREG) 6.25 MG tablet Take 1 tablet (6.25 mg total) by mouth 2 (two) times daily with a meal.  . cyclobenzaprine (FLEXERIL) 5 MG tablet Take 0.5 tablets (2.5 mg total) by mouth 3 (three) times daily as needed for muscle spasms.  . diclofenac sodium (VOLTAREN) 1 % GEL Apply 2 g topically 4 (four) times daily.  . DULoxetine (CYMBALTA) 60 MG capsule TAKE 1 CAPSULE BY MOUTH EVERY DAY  . esomeprazole (NEXIUM) 40 MG capsule Take 40 mg by mouth daily at 12 noon.  . gabapentin (NEURONTIN) 300 MG capsule Take 1 capsule (300 mg total) by mouth 3 (three) times daily.  [name] levothyroxine (SYNTHROID) 50 MCG tablet Take 1 tablet (50 mcg total) by mouth daily.  [name] losartan (COZAAR) 100 MG tablet TAKE 1 TABLET BY MOUTH EVERY DAY  . nystatin (MYCOSTATIN/NYSTOP) powder APPLY TO AFFECTED AREA 3 TIMES A DAY  . oxyCODONE (OXY IR/ROXICODONE) 5 MG immediate release  tablet Take 1 tablet (5 mg total) by mouth every 8 (eight) hours as needed for severe pain. Must last 30 days  . [START ON 11/06/2019] oxyCODONE (OXY IR/ROXICODONE) 5 MG immediate release tablet Take 1 tablet (5 mg total) by mouth every 8 (eight) hours as needed for severe pain. Must last 30 days (Patient not taking: Reported on 10/16/2019)  . [START ON 12/06/2019] oxyCODONE (OXY IR/ROXICODONE) 5 MG immediate release tablet Take 1 tablet (5 mg total) by mouth every 8 (eight) hours as needed for severe pain. Must last 30 days (Patient not taking: Reported on 10/16/2019)  . pregabalin (LYRICA) 50 MG capsule Take 1 capsule (50 mg total) by mouth 3 (three) times daily.   No facility-administered encounter medications on file as of 10/30/2019.     Goals Addressed    . SW: "Mom's anxiety and depression are getting worse since her brother passed." (pt-stated)       Current Barriers:  . Chronic Mental Health needs related to depression, anxiety and most recently- grief . Limited social support . ADL IADL limitations . Mental Health Concerns  . Social Isolation . Memory Deficits . Suicidal Ideation/Homicidal Ideation: No  Clinical Social Work Goal(s):  [name] Over the next 120 days, patient will work with SW  bi-monthly  by telephone or in person to reduce or manage symptoms related to depression, grief and anxiety . Over the next 120 days, patient will demonstrate improved health management independence as evidenced by implementing appropriate self-care and mental health coping tools  into her daily routine to combat stressors.  Interventions: . Patient interviewed and appropriate assessments performed: brief mental health assessment . Patient interviewed and appropriate assessments performed . Provided mental health counseling with regard to anxiety, depression, grief support  . Provided patient with information about available grief support resources within the area ([facility] and [facility]) email sent  with this information to patient's family member . Discussed plans with patient for ongoing care management follow up and provided patient with direct contact information for care management team . Advised patient to contact CCM LCSW if they have not heard from [facility] after one week. Nash Dimmer with RN Case Manager re: referral for grief counseling . Collaborated with primary care provider re: medication concerns . Assisted patient/caregiver with obtaining information about health plan benefits . Provided education and assistance to client regarding Advanced Directives. . Provided education to patient/caregiver regarding level of care options. . Provided education to patient/caregiver about Hospice and/or Palliative Care services . Referred patient to [facility] (mental health provider) for grief follow up and therapy/counseling. Family only wants virtual therapy at this time due to mobility issues. Geanie Cooley with MD re: grief therapy referral made through [facility] on 10/30/19 as patient's brother passed away today. . Grief support intervention implemented throughout session today  Patient Self Care Activities:  [name] Motivation for treatment . Strong family or social support  Patient Coping Strengths:  . Supportive Relationships . Family . Hopefulness  Patient Self Care Deficits:  . Lacks social connections . Unable to perform ADLs independently . Unable to perform IADLs independently  Initial goal documentation     Follow Up Plan: SW will follow up with patient by phone over the next quarter  Eula Fried, BSW, MSW, [email or web address] Phone: [phone]

## 2019-10-30 NOTE — Chronic Care Management (AMB) (Signed)
Chronic Care Management   Follow Up Note   10/30/2019 Name: Cynthia Jones MRN: [id number] DOB: [birth year]  Referred by: Volney American, PA-C Reason for referral : Chronic Care Management (Call back request from daughter: Medication/Depression/Pain/HTN/Grief)   Cynthia Jones is a 79 y.o. year old female who is a primary care patient of Volney American, [place]. The CCM team was consulted for assistance with chronic disease management and care coordination needs.    Review of patient status, including review of consultants reports, relevant laboratory and other test results, and collaboration with appropriate care team members and the patient's provider was performed as part of comprehensive patient evaluation and provision of chronic care management services.    SDOH (Social Determinants of Health) assessments performed: Yes See Care Plan activities for detailed interventions related to [facility])     Outpatient Encounter Medications as of 10/30/2019  Medication Sig  . ALPRAZolam (XANAX) 0.25 MG tablet Take 1 tablet (0.25 mg total) by mouth 2 (two) times daily as needed. for anxiety  . ARIPiprazole (ABILIFY) 5 MG tablet Take 1 tablet (5 mg total) by mouth daily.  . carvedilol (COREG) 6.25 MG tablet Take 1 tablet (6.25 mg total) by mouth 2 (two) times daily with a meal.  . cyclobenzaprine (FLEXERIL) 5 MG tablet Take 0.5 tablets (2.5 mg total) by mouth 3 (three) times daily as needed for muscle spasms.  . diclofenac sodium (VOLTAREN) 1 % GEL Apply 2 g topically 4 (four) times daily.  . DULoxetine (CYMBALTA) 60 MG capsule TAKE 1 CAPSULE BY MOUTH EVERY DAY  . esomeprazole (NEXIUM) 40 MG capsule Take 40 mg by mouth daily at 12 noon.  [name] levothyroxine (SYNTHROID) 50 MCG tablet Take 1 tablet (50 mcg total) by mouth daily.  [name] losartan (COZAAR) 100 MG tablet TAKE 1 TABLET BY MOUTH EVERY DAY  . nystatin (MYCOSTATIN/NYSTOP) powder APPLY TO AFFECTED AREA 3 TIMES A DAY  . oxyCODONE  (OXY IR/ROXICODONE) 5 MG immediate release tablet Take 1 tablet (5 mg total) by mouth every 8 (eight) hours as needed for severe pain. Must last 30 days  . [START ON 11/06/2019] oxyCODONE (OXY IR/ROXICODONE) 5 MG immediate release tablet Take 1 tablet (5 mg total) by mouth every 8 (eight) hours as needed for severe pain. Must last 30 days (Patient not taking: Reported on 10/16/2019)  . [START ON 12/06/2019] oxyCODONE (OXY IR/ROXICODONE) 5 MG immediate release tablet Take 1 tablet (5 mg total) by mouth every 8 (eight) hours as needed for severe pain. Must last 30 days (Patient not taking: Reported on 10/16/2019)  . pregabalin (LYRICA) 50 MG capsule Take 1 capsule (50 mg total) by mouth 3 (three) times daily.   No facility-administered encounter medications on file as of 10/30/2019.     Objective:   Goals Addressed            This Visit's Progress   . RNCM: I am having a hard time adjusting to moving in with my daughter       Current Barriers:  . Chronic Disease Management support, education, and care coordination needs related to HTN, Anxiety, Depression, and Chronic Pain . Grief and mourning over the loss of her husband and having to move in with her daughter 11-15-2022 is her husbands death anniversary). The patients last living sibling passed away on 11/19/2019.   Clinical Goal(s) related to HTN, Anxiety, Depression, and Chronic Pain :  Over the next 120 days, patient will:  . Work with the care management  team to address educational, disease management, and care coordination needs  . Begin or continue self health monitoring activities as directed today Measure and record blood pressure 4 times per week . Call provider office for new or worsened signs and symptoms Blood pressure findings outside established parameters and New or worsened symptom related to Depression, Anxiety or Chronic Pain . Call care management team with questions or concerns . Verbalize basic understanding of patient centered  plan of care established today  Interventions related to HTN, Anxiety, Depression, and Chronic Pain :  . Evaluation of current treatment plans and patient's adherence to plan as established by provider . Assessed patient understanding of disease states . Assessed patient's education and care coordination needs . Provided disease specific education to patient  . Collaborated with appropriate clinical care team members regarding patient needs . Secure chat message sent to the pharmacist and the PA concerning the patients chronic unrelieved pain and decline in health. On 11-09-2019 it was the 5th year death anniversary of her husband, this is likely contributing to her pain and depression. Her daughter [name] says her mother is declining and she does not know how to help her. They are waiting for palliative referral. In addition to pain and grief her last living sibling passed away on November 12, 2019 . Advised the patient and the patient daughter to take medications as prescribed.  Patient Self Care Activities related to HTN, Anxiety, Depression, and Chronic Pain :  . Patient is unable to independently self-manage chronic health conditions  Please see past updates related to this goal by clicking on the "Past Updates" button in the selected goal      . RNCM: Mom is having a really hard time managing her pain and discomfort       CARE PLAN ENTRY (see longtitudinal plan of care for additional care plan information)  Current Barriers:  [name] Knowledge Deficits related to how to effectively manage pain as evidence by unresolved pain in patient with chronic pain syndrome, depression and anxiety . Unresolved grief, depression and anxiety as evidence of losing her spouse and having to move in with her daughter, recent death of her last living sibling on November 12, 2019  Nurse Case Manager Clinical Goal(s):  [name] Over the next 60 days, patient will verbalize understanding of plan for managing pain effectively, having  decreased episodes of depression and anxiety . Over the next 60 days, patient will work with [facility] and pcp to address needs related to pain relief measures and dealing with her depression and anxiety  . Over the next 60 days, patient will demonstrate a decrease in pain exacerbations as evidenced by controlled pain . Over the next 60 days, patient will attend all scheduled medical appointments: Front office staff to make an appointment for the patient to follow up with pcp  Interventions:  . Evaluation of current treatment plan related to pain control and patient's adherence to plan as established by provider. . Advised patient to call the office to get an appointment after the office was unable to get in touch with her- completed on 10-27-2019  . Provided education to patient re: if pain is unresolved and worse to go to the emergency room to be evaluated and treated . Reviewed medications with patient and discussed compliance- the patient is taking medications as prescribed- the patient was given a script for Gabapentin 300 mg TID and Flexeril 5 mg TID prn. Per the daughter the pharmacy would not fill the Flexeril because of another medication the  patient was taking.  The family now request that the patient get Lyrica instead of Gabapentin. Secure Chat sent to pcp and CCM team for assistance. Secure chat message done with pcp and CCM team for assistance and recommendations . Collaborated with pcp and pharmacist  regarding pain control methods . Provided patient with pain educational materials related to non-pharmacological ways to manage pain and discomfort-completed . Reviewed scheduled/upcoming provider appointments including: saw the pcp on Friday 10-27-2019 . Pharmacy consult for support and recommendations  . Social worker support for effective management of grief and depression . Educational material sent by the [employer] on grief  . Empathetic listening due to the death of her last living sibling  on 10/29/2019 . Co-collaboration with the LCSW to assist with grief support ASAP. LCSW able to talk with the patient daughter this am to assist with grief and depression support.   Patient Self Care Activities:  . Patient verbalizes understanding of plan to work with the interdisciplinary team to help patient manage her pain, depression and anxiety more effectively . Self administers medications as prescribed . Attends all scheduled provider appointments . Calls provider office for new concerns or questions . Unable to independently manage pain, depression and anxiety related to chronic conditions . Unable to perform IADLs independently  Please see past updates related to this goal by clicking on the "Past Updates" button in the selected goal          Plan:   The care management team will reach out to the patient again over the next 60 days.    Alto Denver RN, MSN, CCM Community Care Coordinator [place]  [facility] Mobile: [phone]

## 2019-10-30 NOTE — Telephone Encounter (Signed)
Gabapentin d/c'd, lyrica sent. Flexeril re-sent at lower dose to see if insurance would accept that instead

## 2019-10-30 NOTE — Patient Instructions (Signed)
Visit Information  Goals Addressed            This Visit's Progress   . RNCM: I am having a hard time adjusting to moving in with my daughter       Current Barriers:  . Chronic Disease Management support, education, and care coordination needs related to HTN, Anxiety, Depression, and Chronic Pain . Grief and mourning over the loss of her husband and having to move in with her daughter 11/22/2022 is her husbands death anniversary). The patients last living sibling passed away on 11-26-2019.   Clinical Goal(s) related to HTN, Anxiety, Depression, and Chronic Pain :  Over the next 120 days, patient will:  . Work with the care management team to address educational, disease management, and care coordination needs  . Begin or continue self health monitoring activities as directed today Measure and record blood pressure 4 times per week . Call provider office for new or worsened signs and symptoms Blood pressure findings outside established parameters and New or worsened symptom related to Depression, Anxiety or Chronic Pain . Call care management team with questions or concerns . Verbalize basic understanding of patient centered plan of care established today  Interventions related to HTN, Anxiety, Depression, and Chronic Pain :  . Evaluation of current treatment plans and patient's adherence to plan as established by provider . Assessed patient understanding of disease states . Assessed patient's education and care coordination needs . Provided disease specific education to patient  . Collaborated with appropriate clinical care team members regarding patient needs . Secure chat message sent to the pharmacist and the PA concerning the patients chronic unrelieved pain and decline in health. On 11/23/19 it was the 5th year death anniversary of her husband, this is likely contributing to her pain and depression. Her daughter [name] says her mother is declining and she does not know how to help her.  They are waiting for palliative referral. In addition to pain and grief her last living sibling passed away on 26-Nov-2019 . Advised the patient and the patient daughter to take medications as prescribed.  Patient Self Care Activities related to HTN, Anxiety, Depression, and Chronic Pain :  . Patient is unable to independently self-manage chronic health conditions  Please see past updates related to this goal by clicking on the "Past Updates" button in the selected goal      . RNCM: Mom is having a really hard time managing her pain and discomfort       CARE PLAN ENTRY (see longtitudinal plan of care for additional care plan information)  Current Barriers:  [name] Knowledge Deficits related to how to effectively manage pain as evidence by unresolved pain in patient with chronic pain syndrome, depression and anxiety . Unresolved grief, depression and anxiety as evidence of losing her spouse and having to move in with her daughter, recent death of her last living sibling on November 26, 2019  Nurse Case Manager Clinical Goal(s):  [name] Over the next 60 days, patient will verbalize understanding of plan for managing pain effectively, having decreased episodes of depression and anxiety . Over the next 60 days, patient will work with [facility] and pcp to address needs related to pain relief measures and dealing with her depression and anxiety  . Over the next 60 days, patient will demonstrate a decrease in pain exacerbations as evidenced by controlled pain . Over the next 60 days, patient will attend all scheduled medical appointments: [place] office staff to make an appointment for the  patient to follow up with pcp  Interventions:  . Evaluation of current treatment plan related to pain control and patient's adherence to plan as established by provider. . Advised patient to call the office to get an appointment after the office was unable to get in touch with her- completed on 10-27-2019  . Provided education to patient  re: if pain is unresolved and worse to go to the emergency room to be evaluated and treated . Reviewed medications with patient and discussed compliance- the patient is taking medications as prescribed- the patient was given a script for Gabapentin 300 mg TID and Flexeril 5 mg TID prn. Per the daughter the pharmacy would not fill the Flexeril because of another medication the patient was taking.  The family now request that the patient get Lyrica instead of Gabapentin. Secure Chat sent to pcp and CCM team for assistance. Secure chat message done with pcp and CCM team for assistance and recommendations . Collaborated with pcp and pharmacist  regarding pain control methods . Provided patient with pain educational materials related to non-pharmacological ways to manage pain and discomfort-completed . Reviewed scheduled/upcoming provider appointments including: saw the pcp on Friday 10-27-2019 . Pharmacy consult for support and recommendations  . Social worker support for effective management of grief and depression . Educational material sent by the [employer] on grief  . Empathetic listening due to the death of her last living sibling on 10/29/2019 . Co-collaboration with the LCSW to assist with grief support ASAP. LCSW able to talk with the patient daughter this am to assist with grief and depression support.   Patient Self Care Activities:  . Patient verbalizes understanding of plan to work with the interdisciplinary team to help patient manage her pain, depression and anxiety more effectively . Self administers medications as prescribed . Attends all scheduled provider appointments . Calls provider office for new concerns or questions . Unable to independently manage pain, depression and anxiety related to chronic conditions . Unable to perform IADLs independently  Please see past updates related to this goal by clicking on the "Past Updates" button in the selected goal         Patient verbalizes  understanding of instructions provided today.   The care management team will reach out to the patient again over the next 60 days.    Alto Denver RN, MSN, CCM Community Care Coordinator [place]  [facility] Mobile: [phone]  Complicated Grief Grief is a normal response to the death of someone close to you. Feelings of fear, anger, and guilt can affect almost everyone who loses a loved one. It is also common to have symptoms of depression while you are grieving. These include problems with sleep, loss of appetite, and lack of energy. They may last for weeks or months after a loss. Complicated grief is different from normal grief or depression. Normal grieving involves sadness and feelings of loss, but those feelings get better and heal over time. Complicated grief is a severe type of grief that lasts for a long time, usually for several months to a year or longer. It interferes with your ability to function normally. Complicated grief may require treatment from a mental health care provider. What are the causes? The cause of this condition is not known. It is not clear why some people continue to struggle with grief and others do not. What increases the risk? You are more likely to develop this condition if:  The death of your  loved one was sudden or unexpected.  The death of your loved one was due to a violent event.  Your loved one died from suicide.  Your loved one was a child or a young person.  You were very close to your loved one, or you were dependent on him or her.  You have a history of depression or anxiety. What are the signs or symptoms? Symptoms of this condition include:  Feeling disbelief or having a lack of emotion (numbness).  Being unable to enjoy good memories of your loved one.  Needing to avoid anything or anyone that reminds you of your loved one.  Being unable to stop thinking about the death.  Feeling intense  anger or guilt.  Feeling alone and hopeless.  Feeling that your life is meaningless and empty.  Losing the desire to move on with your life. How is this diagnosed? This condition may be diagnosed based on:  Your symptoms. Complicated grief will be diagnosed if you have ongoing symptoms of grief for 6-12 months or longer.  The effect of symptoms on your life. You may be diagnosed with this condition if your symptoms are interfering with your ability to live your life. Your health care provider may recommend that you see a mental health care provider. Many symptoms of depression are similar to the symptoms of complicated grief. It is important to be evaluated for complicated grief along with other mental health conditions. How is this treated? This condition is most commonly treated with talk therapy. This therapy is offered by a mental health specialist (psychiatrist). During therapy:  You will learn healthy ways to cope with the loss of your loved one.  Your mental health care provider may recommend antidepressant medicines. Follow these instructions at home: Lifestyle   Take care of yourself. ? Eat on a regular basis, and maintain a healthy diet. Eat plenty of fruits, vegetables, lean protein, and whole grains. ? Try to get some exercise each day. Aim for 30 minutes of exercise on most days of the week. ? Keep a consistent sleep schedule. Try to get 8 or more hours of sleep each night. ? Start doing the things that you used to enjoy.  Do not use drugs or alcohol to ease your symptoms.  Spend time with friends and loved ones. General instructions  Take over-the-counter and prescription medicines only as told by your health care provider.  Consider joining a grief (bereavement) support group to help you deal with your loss.  Keep all follow-up visits as told by your health care provider. This is important. Contact a health care provider if:  Your symptoms prevent you from  functioning normally.  Your symptoms do not get better with treatment. Get help right away if:  You have serious thoughts about hurting yourself or someone else.  You have suicidal feelings. If you ever feel like you may hurt yourself or others, or have thoughts about taking your own life, get help right away. You can go to your nearest emergency department or call:  Your local emergency services (911 in the [place]  A suicide crisis helpline, such as the National Suicide Prevention Lifeline at 229-816-6903. This is open 24 hours a day. Summary  Complicated grief is a severe type of grief that lasts for a long time. This grief is not likely to go away on its own. Get the help you need.  Some griefs are more difficult than others and can cause this condition. You may need a  certain type of treatment to help you recover if the loss of your loved one was sudden, violent, or due to suicide.  You may feel guilty about moving on with your life. Getting help does not mean that you are forgetting your loved one. It means that you are taking care of yourself.  Complicated grief is best treated with talk therapy. Medicines may also be prescribed.  Seek the help you need, and find support that will help you recover. This information is not intended to replace advice given to you by your health care provider. Make sure you discuss any questions you have with your health care provider. Document Revised: 07/30/2017 Document Reviewed: 06/02/2017 Elsevier Patient Education  2020 [employer].

## 2019-10-30 NOTE — Telephone Encounter (Signed)
Patient's daughter [name]  notified. She stated that patient's brother passed away yesterday and she is just trying to do what is right for patient to help her to go through this difficult times. Routing to you as an Financial planner

## 2019-11-03 ENCOUNTER — Telehealth: Payer: Self-pay

## 2019-11-03 NOTE — Telephone Encounter (Signed)
PA Approved

## 2019-11-03 NOTE — Telephone Encounter (Signed)
Prior Authorization initiated via CoverMyMeds for Nystatin 100000 UNIT/GM powder Key: [id number]

## 2019-11-09 ENCOUNTER — Encounter: Payer: Self-pay | Admitting: Nurse Practitioner

## 2019-11-09 ENCOUNTER — Other Ambulatory Visit: Payer: Self-pay

## 2019-11-09 ENCOUNTER — Other Ambulatory Visit: Payer: Medicare HMO | Admitting: Nurse Practitioner

## 2019-11-09 DIAGNOSIS — Z515 Encounter for palliative care: Secondary | ICD-10-CM | POA: Diagnosis not present

## 2019-11-09 DIAGNOSIS — M797 Fibromyalgia: Secondary | ICD-10-CM

## 2019-11-09 NOTE — Progress Notes (Signed)
Therapist, nutritional Palliative Care Consult Note Telephone: [phone]  Fax: [phone]  PATIENT NAME: Cynthia Jones DOB: Feb 26, 1941 MRN: [id number]  PRIMARY CARE PROVIDER:   [name], PA-C  REFERRING PROVIDER:  [name], PA-C [address] [place],  [place] [address]  RESPONSIBLE PARTY:   Baltazar Najjar, hospice nurse that works night [phone] or daughter [name] [phone].  Due to the COVID-19 crisis, this visit was done via telemedicine from my office and it was initiated and consent by this patient and or family.  I was asked by Roosvelt Maser PA to see Cynthia Jones for Palliative care consult for goals of care   RECOMMENDATIONS and PLAN:  1. ACP: changed to DNR; placed in Epic/Vynca; will mail goldenrod, blank MOST form and Hard Choice book for review to revisit at next [facility] visit   2. Palliative care encounter; Palliative medicine team will continue to support patient, patient's family, and medical team. Visit consisted of counseling and education dealing with the complex and emotionally intense issues of symptom management and palliative care in the setting of serious and potentially life-threatening illness  I spent 70 minutes providing this consultation,  from 3:30pm to 4:40pm. More than 50% of the time in this consultation was spent coordinating communication.   HISTORY OF PRESENT ILLNESS:  Cynthia Jones is a 79 y.o. year old female with multiple medical problems including Reactive airway disease, fibromyalgia, diabetes, hypothyroidism, neuropathy,  lumbar spondylosis, vitamin D deficiency, obesity, degenerative joint disease, allergies, osteopenia, chronic pain syndrome, chronic pain of low back, bilateral knees, bilateral hips, left foot, cervicogenic headache, bilateral occipital neuralgia, anxiety, depression, tonsillectomy, right knee joint replacement, eye surgery, cholecystectomy, appendectomy, abdominal  hysterectomy, parotidectomy. Vikki Ports, Ms. Allor granddaughter send a message to Roosvelt Maser PA documented in [place], per conversation she is health care power-of-attorney and also a hospice nurse. Per documentation Ms Jones has been declining, changes with pain and anxiety with recently having to move in with her daughter, grieving loss of Independence and having to leave the home she shared with her husband for many years. Request was for increasing dose or frequency of her Xanax in addition to requesting a palliative care consult for possible hospice evaluation. Followed by Pain Clinic Dr. Laban Emperor last visit 2/3 /2021 for medical management. Per documentation Dr Laban Emperor updated on an incident that occurred with possible food poisoning that left Ms Jones incontinent, unable to ambulate, unable to bathe herself, unable to take care of her basic activities of daily living, shuffling gait and diarrhea with vomiting. Dr Shauna Hugh recommended taking her to an urgent care for lab work as concerned for electrolyte imbalance. Cynthia Jones stopped her Gabapentin as she felt it made her weird and not contributing much to her analgesia. Last primary provider visit with Roosvelt Maser PA 2 / 26 / 2021 fibromyalgia poorly controlled re-added Gabapentin at a higher dose, IM Toradol given. Flexeril and continue oxycodone regiment, referral to pain clinic for second opinion for pain management. Added chronic Care Management social worker to help with counseling, resources, Grief, pain and adjustment changes with palliative care consult ordered. I called [name], Ms. Lawson daughter for scheduled initial palliative care consult. We talked about purpose of consult. Christine and Ms. Whetsel in agreement. We talked about the last time Cynthia Jones was independent at home which was about six weeks ago. Functionally at that time she was able to ambulate , no recent falls. She was able to bathe and dress herself,  toilet.  She does feed herself. We talked about the episode that she had with food poisoning with diarrhea, nausea and vomiting, weakness where she lost strength in her right side and right arm. Christine Ms Sova talked about her current functional state. Currently Cynthia Jones requires someone to help her stand up out of a chair by lifting her up. Cynthia Jones unable to get out of the chair by herself. Cynthia Jones is unable to get to a standing position without assistance. Cynthia Jones require someone to bathe and dress her. Cynthia Jones requires total assistance with toileting as Cynthia Jones is unable to clean herself. Cynthia Jones is able to feed herself and appetite has been there. Cynthia Jones does eat her meals in a timely manner without difficulty swallowing or chewing. Weight has been maintained and family supplements with yogurt, boost and pedialyte when episodes of nausea vomiting or diarrhea. Cynthia Jones does enjoy cards although she is been having more pain and not be able to perform that function as well. Christine endorses Cynthia Jones lost the strength in her right arm and she's unable to pick up a cup. Cynthia Jones is unable to lift her legs up to put them in bed. Cynthia Jones unable to move herself in the bed and requires her to be positioned and propped with pillows. We talked about symptoms of pain. We reviewed medications and currently she is not taking the Gabapentin, the Xanax or the oxycodone all are on hold. Cynthia Jones is taking Lyrica 50 mg tid and flexeril 2.5 mg tid. Cynthia Jones that the pain is better. Cynthia Jones endorses she is awake earlier in the day without the oxycodone. Christine talked at length about her anxiety. Ms. Klinke is getting counseling through Chronic Care Management. Christine talked at length about her being moved from her own home that Ms. Dumlao is lived in with her other children and grandchildren around her to [place] home which is about 30  minutes away. Cynthia Jones talks about her grieving, loss of Independence, functional changes, concern for disease progression. We talked about medical goals of care. We talked about aggressive versus comfort. We talked about code status as she currently has a full code. We talked about CPR and different scenarios. Ms. Margerum endorses her wishes are to be a DNR. Will complete Goldenrod form, place in Epic/Vynca then mail in addition to blank MOST form and Hard Choice book. Will revisit at next palliative care visit. We talked about CT scan for brewlab stroke due to clinical presentation with loss of strength in the right arm, right sided weakness significantly that impacts total functional change. Christine and Ms. Tedder was an agreement for CT brain. Also we talked about medications and Vitamin D supplements. Will follow up with Merrie Roof PA for CT scan of brain and vitamin D supplement. We also talked about hospice benefit under Medicare. We talked about Medicare criteria. Christine in agreement to have her case reviewed by [facility]ospice [facility]. We discussed if she is hospice eligible will proceed with hospice. We also discussed if she is not hospice eligible will proceed with requesting PT / OT consult for pain modality and mobility. We also talked about palliative care social worker referral for further discussion about resources available through insurance. We talked about role of palliative care and plan of care. Discuss that will re-contact Cynthia Jones once hospice Physicians determine eligibility and then further discuss medical goals from there with either proceed with hospice or follow-up palliative care appointment. Christine an agreement.  Therapeutic listening and emotional support provided. Contact information provided. Questions answered to satisfaction  Weight 223 lbs Palliative Care was asked to help address goals of care.   CODE STATUS: DNR  PPS: 40% HOSPICE ELIGIBILITY/DIAGNOSIS:  TBD  PAST MEDICAL HISTORY:  Past Medical History:  Diagnosis Date  . Acute anxiety 03/06/2015  . Allergy   . Anxiety   . Degenerative joint disease of knee   . Depression   . Fibromyalgia   . Obesity   . Osteopenia   . RAD (reactive airway disease)     SOCIAL HX:  Social History   Tobacco Use  . Smoking status: Never Smoker  . Smokeless tobacco: Never Used  Substance Use Topics  . Alcohol use: No    Alcohol/week: 0.0 standard drinks    ALLERGIES:  Allergies  Allergen Reactions  . Nsaids Other (See Comments)    GI upset, hx of H pylori and gastric ulcer  . Erythromycin Nausea Only     PERTINENT MEDICATIONS:  Outpatient Encounter Medications as of 11/09/2019  Medication Sig  . cyclobenzaprine (FLEXERIL) 5 MG tablet Take 0.5 tablets (2.5 mg total) by mouth 3 (three) times daily as needed for muscle spasms.  . pregabalin (LYRICA) 50 MG capsule Take 1 capsule (50 mg total) by mouth 3 (three) times daily.  [name] ALPRAZolam (XANAX) 0.25 MG tablet Take 1 tablet (0.25 mg total) by mouth 2 (two) times daily as needed. for anxiety (Patient not taking: Reported on 11/09/2019)  . ARIPiprazole (ABILIFY) 5 MG tablet Take 1 tablet (5 mg total) by mouth daily.  . carvedilol (COREG) 6.25 MG tablet Take 1 tablet (6.25 mg total) by mouth 2 (two) times daily with a meal.  . diclofenac sodium (VOLTAREN) 1 % GEL Apply 2 g topically 4 (four) times daily.  . DULoxetine (CYMBALTA) 60 MG capsule TAKE 1 CAPSULE BY MOUTH EVERY DAY  . esomeprazole (NEXIUM) 40 MG capsule Take 40 mg by mouth daily at 12 noon.  [name] levothyroxine (SYNTHROID) 50 MCG tablet Take 1 tablet (50 mcg total) by mouth daily.  [name] losartan (COZAAR) 100 MG tablet TAKE 1 TABLET BY MOUTH EVERY DAY  . nystatin (MYCOSTATIN/NYSTOP) powder APPLY TO AFFECTED AREA 3 TIMES A DAY  . oxyCODONE (OXY IR/ROXICODONE) 5 MG immediate release tablet Take 1 tablet (5 mg total) by mouth every 8 (eight) hours as needed for severe pain. Must last 30 days  .  oxyCODONE (OXY IR/ROXICODONE) 5 MG immediate release tablet Take 1 tablet (5 mg total) by mouth every 8 (eight) hours as needed for severe pain. Must last 30 days (Patient not taking: Reported on 10/16/2019)  . [START ON 12/06/2019] oxyCODONE (OXY IR/ROXICODONE) 5 MG immediate release tablet Take 1 tablet (5 mg total) by mouth every 8 (eight) hours as needed for severe pain. Must last 30 days (Patient not taking: Reported on 10/16/2019)   No facility-administered encounter medications on file as of 11/09/2019.    PHYSICAL EXAM:  Deferred  Fines Kimberlin Z Kimi Bordeau, NP

## 2019-11-10 ENCOUNTER — Other Ambulatory Visit: Payer: Self-pay | Admitting: Family Medicine

## 2019-11-10 ENCOUNTER — Telehealth: Payer: Self-pay | Admitting: Nurse Practitioner

## 2019-11-10 ENCOUNTER — Telehealth: Payer: Self-pay | Admitting: Family Medicine

## 2019-11-10 DIAGNOSIS — R29818 Other symptoms and signs involving the nervous system: Secondary | ICD-10-CM

## 2019-11-10 DIAGNOSIS — R29898 Other symptoms and signs involving the musculoskeletal system: Secondary | ICD-10-CM

## 2019-11-10 MED ORDER — VITAMIN D (ERGOCALCIFEROL) 1.25 MG (50000 UNIT) PO CAPS: 50000.0000 [IU] | ORAL_CAPSULE | ORAL | 1 refills | Status: DC

## 2019-11-10 NOTE — Telephone Encounter (Signed)
I called Cynthia Jones daughter, Cynthia Jones about palliative care follow-up visit yesterday. We talked about case being reviewed by hospice positions for eligibility. Dr. Dan Humphreys requested to continue on palliative to monitor for further decline. Discussed with Cynthia Jones that followed up with Cynthia Maser PA order supplemental vitamin D and CT brain. Will contact Cynthia Maser PA to proceed with physical therapy and occupational therapy in the home to see how Cynthia Jones does. We talked about follow-up palliative care visit hopefully able to schedule one week and will be with Cynthia Jones as Cynthia Jones move to her daughter's home in [place] which change palliative territories. Mail DNR forms in addition to blank MOST form and Hard Choice book with palliative and hospice information. Completed Palliative Social Work consult request. Therapeutic listening and emotional support provided. Cynthia Jones an agreement with plan. Contact information. Questions answered to satisfaction.  Total time spent 25 minutes   Discussion 15 minutes Documentation 10 minutes

## 2019-11-10 NOTE — Telephone Encounter (Signed)
Received message from Palliative Care consult regarding right sided UE weakness now in addition to generalized functional decline in pt over past 6 weeks. Will order CT head and await results. No acute changes, pt not wanting to go to hospital at this time.

## 2019-11-12 ENCOUNTER — Telehealth: Payer: Self-pay | Admitting: Family Medicine

## 2019-11-12 DIAGNOSIS — R29898 Other symptoms and signs involving the musculoskeletal system: Secondary | ICD-10-CM

## 2019-11-12 DIAGNOSIS — R531 Weakness: Secondary | ICD-10-CM

## 2019-11-12 DIAGNOSIS — R5381 Other malaise: Secondary | ICD-10-CM

## 2019-11-12 NOTE — Telephone Encounter (Signed)
Received message from Palliative Care requesting PT and OT orders for patient given her functional status decline and generalized weakness, particularly with RUE. This had been declined in the past weeks during follow ups but family and patient now agreeable.

## 2019-11-14 ENCOUNTER — Other Ambulatory Visit: Payer: Self-pay

## 2019-11-14 ENCOUNTER — Other Ambulatory Visit: Payer: Medicare HMO | Admitting: Primary Care

## 2019-11-14 DIAGNOSIS — Z515 Encounter for palliative care: Secondary | ICD-10-CM

## 2019-11-14 NOTE — Progress Notes (Signed)
[facility] Consult Note Telephone: [phone]  Fax: [phone]    PATIENT NAME: Cynthia Jones [address] [place] [address] [phone] (home)  DOB: 09-20-1940 MRN: [id number]  PRIMARY CARE PROVIDER:   Nyra Capes, [place] [id number] [phone]  REFERRING PROVIDER:  Nyra Capes [address] [place],  [place] [address] [phone]  RESPONSIBLE PARTY:   Extended Emergency Contact Information Primary Emergency Contact: Lanae Crumbly Mobile Phone: [phone] Relation: Daughter Secondary Emergency Contact: Getter,Ray Address: [address]          [place], [place] [address] Johnnette Litter of [place] Phone: [phone] Relation: Son   ASSESSMENT AND RECOMMENDATIONS:   1. Advance Care Planning/Goals of Care: Goals include to maximize quality of life and symptom management.MOST done and DNR done. DNR, limited scope, use of abx and iv, limited feeding tube use. Left hard copies and will upload in [place].  2. Symptom Management:   Pain:  Has been patient of [facility], patient  States she is not well controlled.  Will ask PCP about vitamin therapy.  Now taking lyrica and flexeril. Takes tylenol as needed. States pain is all over and has strength loss. Has fibromyalgia, RA and OA. Complains of joint pain. Discussed chair exercises and does some range of motion. Recommend ATC arthritis acetaminophen. Want referral to nnoother pain management clinic.  Possible CVA: Endorses R sided weakness. Recommend home health for PT and OT, please make referral. Needs PT, OT eval and treat. Uses walker, has been using x several weeks.   Debility: Recounts recent loss of family and home due to not able to be alone any more. Active listening. Patient has a card hobby for cancer patients which she finds fulfilling.  3. Family /Caregiver/Community Supports:  Moved into daughter's home,   regular bed but may benefit from hospital bed. Daughter has adapted home to be accessible.   4. Cognitive / Functional decline: Alert and oriented x 3. Has come to daughter's home to live due to functional decline.  5. Follow up Palliative Care Visit: Palliative care will continue to follow for goals of care clarification and symptom management. Return 4 weeks or prn.  I spent 60 minutes providing this consultation,  From 1545  to 1645. More than 50% of the time in this consultation was spent coordinating communication.   HISTORY OF PRESENT ILLNESS:  Cynthia Jones is a 79 y.o. year old female with multiple medical problems including r/o cva, depression, DM, HTN, obesity, OA, RA, fibromyalgia. Palliative Care was asked to follow this patient by consultation request of Volney American,* to help address advance care planning and goals of care. This is a follow up visit.  CODE STATUS: DNR, Limited MOST, use of abx and iv, limited feeding tube  PPS:50% HOSPICE ELIGIBILITY/DIAGNOSIS: TBD  PAST MEDICAL HISTORY:  Past Medical History:  Diagnosis Date  . Acute anxiety 03/06/2015  . Allergy   . Anxiety   . Degenerative joint disease of knee   . Depression   . Fibromyalgia   . Obesity   . Osteopenia   . RAD (reactive airway disease)     SOCIAL HX:  Social History   Tobacco Use  . Smoking status: Never Smoker  . Smokeless tobacco: Never Used  Substance Use Topics  . Alcohol use: No    Alcohol/week: 0.0 standard drinks    ALLERGIES:  Allergies  Allergen Reactions  . Nsaids  Other (See Comments)    GI upset, hx of H pylori and gastric ulcer  . Erythromycin Nausea Only     PERTINENT MEDICATIONS:  Outpatient Encounter Medications as of 11/14/2019  Medication Sig  . ARIPiprazole (ABILIFY) 5 MG tablet Take 1 tablet (5 mg total) by mouth daily.  . carvedilol (COREG) 6.25 MG tablet Take 1 tablet (6.25 mg total) by mouth 2 (two) times daily with a meal.  . cyclobenzaprine  (FLEXERIL) 5 MG tablet Take 0.5 tablets (2.5 mg total) by mouth 3 (three) times daily as needed for muscle spasms.  . diclofenac sodium (VOLTAREN) 1 % GEL Apply 2 g topically 4 (four) times daily.  . DULoxetine (CYMBALTA) 60 MG capsule TAKE 1 CAPSULE BY MOUTH EVERY DAY  . esomeprazole (NEXIUM) 40 MG capsule Take 40 mg by mouth daily at 12 noon.  [name] levothyroxine (SYNTHROID) 50 MCG tablet Take 1 tablet (50 mcg total) by mouth daily.  [name] losartan (COZAAR) 100 MG tablet TAKE 1 TABLET BY MOUTH EVERY DAY  . nystatin (MYCOSTATIN/NYSTOP) powder APPLY TO AFFECTED AREA 3 TIMES A DAY  . pregabalin (LYRICA) 50 MG capsule Take 1 capsule (50 mg total) by mouth 3 (three) times daily.  [name] ALPRAZolam (XANAX) 0.25 MG tablet Take 1 tablet (0.25 mg total) by mouth 2 (two) times daily as needed. for anxiety (Patient not taking: Reported on 11/09/2019)  . oxyCODONE (OXY IR/ROXICODONE) 5 MG immediate release tablet Take 1 tablet (5 mg total) by mouth every 8 (eight) hours as needed for severe pain. Must last 30 days  . oxyCODONE (OXY IR/ROXICODONE) 5 MG immediate release tablet Take 1 tablet (5 mg total) by mouth every 8 (eight) hours as needed for severe pain. Must last 30 days (Patient not taking: Reported on 10/16/2019)  . [START ON 12/06/2019] oxyCODONE (OXY IR/ROXICODONE) 5 MG immediate release tablet Take 1 tablet (5 mg total) by mouth every 8 (eight) hours as needed for severe pain. Must last 30 days (Patient not taking: Reported on 10/16/2019)  . Vitamin D, Ergocalciferol, (DRISDOL) 1.25 MG (50000 UNIT) CAPS capsule Take 1 capsule (50,000 Units total) by mouth every 7 (seven) days. (Patient not taking: Reported on 11/14/2019)   No facility-administered encounter medications on file as of 11/14/2019.    PHYSICAL EXAM / ROS:   Current and past weights: not reported General: NAD, frail appearing, obese Cardiovascular: no chest pain reported, no edema reported Pulmonary: no cough, no increased SOB Abdomen: appetite good,  denies constipation, continent of bowel GU: denies dysuria, continent of urine MSK:  no joint deformities, ambulatory with waker Skin: no rashes or wounds reported Neurological: Weakness R>L,  Chronic pain  Eliezer Lofts, NP [facility]  COVID-19 PATIENT SCREENING TOOL  Person answering questions: ____________Shirley_______ _____   1.  Is the patient or any family member in the home showing any signs or symptoms regarding respiratory infection?               Person with Symptom- __________NA_________________  a. Fever                                                                          Yes___ No___          ___________________  b. Shortness of breath                                                    Yes___ No___          ___________________ c. Cough/congestion                                       Yes___  No___         ___________________ d. Body aches/pains                                                         Yes___ No___        ____________________ e. Gastrointestinal symptoms (diarrhea, nausea)           Yes___ No___        ____________________  2. Within the past 14 days, has anyone living in the home had any contact with someone with or under investigation for COVID-19?    Yes___ No_X_   Person __________________

## 2019-11-15 ENCOUNTER — Ambulatory Visit (INDEPENDENT_AMBULATORY_CARE_PROVIDER_SITE_OTHER): Payer: Medicare HMO | Admitting: Family Medicine

## 2019-11-15 ENCOUNTER — Encounter: Payer: Self-pay | Admitting: Family Medicine

## 2019-11-15 VITALS — BP 133/51 | Temp 98.1°F | Ht 60.0 in | Wt 222.0 lb

## 2019-11-15 DIAGNOSIS — M797 Fibromyalgia: Secondary | ICD-10-CM | POA: Diagnosis not present

## 2019-11-15 DIAGNOSIS — F419 Anxiety disorder, unspecified: Secondary | ICD-10-CM

## 2019-11-15 MED ORDER — PREGABALIN 50 MG PO CAPS: 50.0000 mg | ORAL_CAPSULE | Freq: Three times a day (TID) | ORAL | 2 refills | Status: DC

## 2019-11-15 NOTE — Progress Notes (Signed)
BP (!) 133/51   Temp 98.1 F (36.7 C) (Tympanic)   Ht 5' (1.524 m)   Wt 222 lb (100.7 kg)   BMI 43.36 kg/m    Subjective:    Patient ID: Cynthia Jones, female    DOB: Jul 19, 1941, 79 y.o.   MRN: [id number]  HPI: Cynthia Jones is a 79 y.o. female  Chief Complaint  Patient presents with  . Anxiety    . This visit was completed via telephone due to the restrictions of the COVID-19 pandemic. All issues as above were discussed and addressed. Physical exam was done as above through visual confirmation on telephone. If it was felt that the patient should be evaluated in the office, they were directed there. The patient verbally consented to this visit. . Location of the patient: home . Location of the provider: work . Those involved with this call:  . Provider: Merrie Roof, PA-C . CMA: Lesle Chris, [place] . Front Desk/Registration: Jill Side  . Time spent on call: 15 minutes on the phone discussing health concerns. 5 minutes total spent in review of patient's record and preparation of their chart. I verified patient identity using two factors (patient name and date of birth). Patient consents verbally to being seen via telemedicine visit today.   Presenting today for anxiety follow up. Patient and daughter state things are actually going fairly well at the moment. Her anxiety seems to be under good control now that her pain is improved. The lyrica seems to be working well in addition to prn flexeril. Stopped the oxycodone as she's between pain clinics and xanax as recommended by pharmacist while taking flexeril. Denies frequent panic attacks, crying spells. Palliative Care now following given her recent decline in functional status.   Depression screen [facility] 2/9 11/15/2019 10/16/2019 10/13/2019  Decreased Interest 0 2 1  Down, Depressed, Hopeless 0 2 1  PHQ - 2 Score 0 4 2  Altered sleeping 0 3 0  Tired, decreased energy 1 3 1   Change in appetite 0 2 0  Feeling bad or failure  about yourself  0 1 0  Trouble concentrating 0 2 0  Moving slowly or fidgety/restless 0 3 0  Suicidal thoughts 0 0 0  PHQ-9 Score 1 18 3   Difficult doing work/chores - Somewhat difficult -  Some recent data might be hidden   GAD 7 : Generalized Anxiety Score 11/15/2019 10/16/2019 06/16/2019  Nervous, Anxious, on Edge 0 3 2  Control/stop worrying 1 3 1   Worry too much - different things 1 3 2   Trouble relaxing 0 3 2  Restless 0 3 0  Easily annoyed or irritable 0 3 1  Afraid - awful might happen 0 2 0  Total GAD 7 Score 2 20 8   Anxiety Difficulty - Very difficult Somewhat difficult   Relevant past medical, surgical, family and social history reviewed and updated as indicated. Interim medical history since our last visit reviewed. Allergies and medications reviewed and updated.  Review of Systems  Per HPI unless specifically indicated above     Objective:    BP (!) 133/51   Temp 98.1 F (36.7 C) (Tympanic)   Ht 5' (1.524 m)   Wt 222 lb (100.7 kg)   BMI 43.36 kg/m   Wt Readings from Last 3 Encounters:  11/15/19 222 lb (100.7 kg)  10/27/19 223 lb (101.2 kg)  10/16/19 221 lb (100.2 kg)    Physical Exam  Unable to obtain PE due to patient lack  of access to video technology for today's visit  Results for orders placed or performed in visit on 10/27/19  CBC With Differential/Platelet  Result Value Ref Range   WBC 8.4 3.4 - 10.8 x10E3/uL   RBC 4.17 3.77 - 5.28 x10E6/uL   Hemoglobin 13.2 11.1 - 15.9 g/dL   Hematocrit 22.0 25.4 - 46.6 %   MCV 93 79 - 97 fL   MCH 31.7 26.6 - 33.0 pg   MCHC 33.9 31.5 - 35.7 g/dL   RDW 27.0 62.3 - 76.2 %   Platelets 280 150 - 450 x10E3/uL   Neutrophils 57 Not Estab. %   Lymphs 30 Not Estab. %   MID 13 Not Estab. %   Neutrophils Absolute 4.8 1.4 - 7.0 x10E3/uL   Lymphocytes Absolute 2.5 0.7 - 3.1 x10E3/uL   MID (Absolute) 1.1 0.1 - 1.6 X10E3/uL  UA/M w/rflx Culture, Routine   Specimen: Urine   URINE  Result Value Ref Range   Specific  Gravity, UA 1.015 1.005 - 1.030   pH, UA 5.0 5.0 - 7.5   Color, UA Yellow Yellow   Appearance Ur Clear Clear   Leukocytes,UA Negative Negative   Protein,UA Negative Negative/Trace   Glucose, UA Negative Negative   Ketones, UA Negative Negative   RBC, UA Negative Negative   Bilirubin, UA Negative Negative   Urobilinogen, Ur 0.2 0.2 - 1.0 mg/dL   Nitrite, UA Negative Negative      Assessment & Plan:   Problem List Items Addressed This Visit      Other   Fibromyalgia - Primary (Chronic)    Significant benefit with lyrica and flexeril prn. Continue current regimen, tylenol prn for breakthrough pain. Has referral for new Pain Clinic pending      Relevant Medications   Acetaminophen (TYLENOL 8 HOUR PO)   pregabalin (LYRICA) 50 MG capsule   Anxiety    Improved currently, continue current regimen. Continue off xanax for now as she seems to be stable off          Follow up plan: Return in about 3 months (around 02/15/2020) for 6 month f/u.

## 2019-11-17 ENCOUNTER — Telehealth: Payer: Self-pay

## 2019-11-17 NOTE — Assessment & Plan Note (Signed)
Improved currently, continue current regimen. Continue off xanax for now as she seems to be stable off

## 2019-11-17 NOTE — Assessment & Plan Note (Signed)
Significant benefit with lyrica and flexeril prn. Continue current regimen, tylenol prn for breakthrough pain. Has referral for new Pain Clinic pending

## 2019-11-20 ENCOUNTER — Ambulatory Visit
Admission: RE | Admit: 2019-11-20 | Discharge: 2019-11-20 | Disposition: A | Discharge status: Home / Self Care | Payer: Medicare HMO | Source: Ambulatory Visit | Attending: Family Medicine | Admitting: Family Medicine

## 2019-11-20 ENCOUNTER — Other Ambulatory Visit: Payer: Self-pay

## 2019-11-20 DIAGNOSIS — R29898 Other symptoms and signs involving the musculoskeletal system: Secondary | ICD-10-CM

## 2019-11-20 DIAGNOSIS — R29818 Other symptoms and signs involving the nervous system: Secondary | ICD-10-CM | POA: Insufficient documentation

## 2019-11-20 DIAGNOSIS — R531 Weakness: Secondary | ICD-10-CM | POA: Diagnosis not present

## 2019-11-28 ENCOUNTER — Telehealth: Payer: Self-pay | Admitting: Family Medicine

## 2019-11-28 ENCOUNTER — Telehealth: Payer: Self-pay

## 2019-11-28 ENCOUNTER — Other Ambulatory Visit: Payer: Self-pay

## 2019-11-28 ENCOUNTER — Encounter: Payer: Self-pay | Admitting: Family Medicine

## 2019-11-28 ENCOUNTER — Telehealth (INDEPENDENT_AMBULATORY_CARE_PROVIDER_SITE_OTHER): Payer: Medicare HMO | Admitting: Family Medicine

## 2019-11-28 ENCOUNTER — Ambulatory Visit: Payer: Self-pay | Admitting: Pharmacist

## 2019-11-28 DIAGNOSIS — J3089 Other allergic rhinitis: Secondary | ICD-10-CM | POA: Diagnosis not present

## 2019-11-28 DIAGNOSIS — J0100 Acute maxillary sinusitis, unspecified: Secondary | ICD-10-CM | POA: Diagnosis not present

## 2019-11-28 MED ORDER — AMOXICILLIN-POT CLAVULANATE 875-125 MG PO TABS: 1.0000 | ORAL_TABLET | Freq: Two times a day (BID) | ORAL | 0 refills | Status: DC

## 2019-11-28 MED ORDER — CETIRIZINE HCL 10 MG PO TABS: 10.0000 mg | ORAL_TABLET | Freq: Every day | ORAL | 11 refills | Status: DC

## 2019-11-28 NOTE — Telephone Encounter (Signed)
Copied from CRM [id number]. Topic: General - Other >> Nov 28, 2019  8:44 AM Tamela Oddi wrote: Reason for CRM: Patient would like the doctor to call her regarding a sinus infection.  She stated that she had some questions for the doctor and wanted to talk to her.  CB# [phone]

## 2019-11-28 NOTE — Telephone Encounter (Signed)
Called pt to schedule virtual appt, no answer, vm not set up

## 2019-11-28 NOTE — Progress Notes (Signed)
There were no vitals taken for this visit.   Subjective:    Patient ID: Cynthia Jones, female    DOB: 1941-07-08, 79 y.o.   MRN: [id number]  HPI: Cynthia Jones is a 79 y.o. female  Chief Complaint  Patient presents with  . URI    pt states she has had a runny nose, sore throat from drainage, and watery eyes for the past week    . This visit was completed via MyChart due to the restrictions of the COVID-19 pandemic. All issues as above were discussed and addressed. Physical exam was done as above through visual confirmation on MyChart. If it was felt that the patient should be evaluated in the office, they were directed there. The patient verbally consented to this visit. . Location of the patient: home . Location of the provider: home . Those involved with this call:  . Provider: Roosvelt Maser, PA-C . CMA: Elton Sin, CMA . Front Desk/Registration: Harriet Pho  . Time spent on call: 15 minutes with patient face to face via video conference. More than 50% of this time was spent in counseling and coordination of care. 5 minutes total spent in review of patient's record and preparation of their chart. I verified patient identity using two factors (patient name and date of birth). Patient consents verbally to being seen via telemedicine visit today.   Presenting today with 1.5 weeks of sinus drainage, sore throat, sinus pain and pressure, and rhinorrhea. Also now having some watery eyes additionally. Taking coricidin D with minimal benefit. Denies fever, chills, cough, CP, SOB, sick contacts. Hx of allergies, not taking anything for these.   Relevant past medical, surgical, family and social history reviewed and updated as indicated. Interim medical history since our last visit reviewed. Allergies and medications reviewed and updated.  Review of Systems  Per HPI unless specifically indicated above     Objective:    There were no vitals taken for this visit.  Wt  Readings from Last 3 Encounters:  11/15/19 222 lb (100.7 kg)  10/27/19 223 lb (101.2 kg)  10/16/19 221 lb (100.2 kg)    Physical Exam Vitals and nursing note reviewed.  Constitutional:      General: She is not in acute distress.    Appearance: Normal appearance.  HENT:     Head: Atraumatic.     Right Ear: External ear normal.     Left Ear: External ear normal.     Nose: Rhinorrhea present. No congestion.     Mouth/Throat:     Mouth: Mucous membranes are moist.     Pharynx: Oropharynx is clear. Posterior oropharyngeal erythema present.  Eyes:     Extraocular Movements: Extraocular movements intact.     Conjunctiva/sclera: Conjunctivae normal.  Cardiovascular:     Comments: Unable to assess via virtual visit Pulmonary:     Effort: Pulmonary effort is normal. No respiratory distress.  Musculoskeletal:        General: Normal range of motion.     Cervical back: Normal range of motion.  Skin:    General: Skin is dry.     Findings: No erythema.  Neurological:     Mental Status: She is alert and oriented to person, place, and time.  Psychiatric:        Mood and Affect: Mood normal.        Thought Content: Thought content normal.        Judgment: Judgment normal.     Results for  orders placed or performed in visit on 10/27/19  CBC With Differential/Platelet  Result Value Ref Range   WBC 8.4 3.4 - 10.8 x10E3/uL   RBC 4.17 3.77 - 5.28 x10E6/uL   Hemoglobin 13.2 11.1 - 15.9 g/dL   Hematocrit 38.9 34.0 - 46.6 %   MCV 93 79 - 97 fL   MCH 31.7 26.6 - 33.0 pg   MCHC 33.9 31.5 - 35.7 g/dL   RDW 14.1 11.7 - 15.4 %   Platelets 280 150 - 450 x10E3/uL   Neutrophils 57 Not Estab. %   Lymphs 30 Not Estab. %   MID 13 Not Estab. %   Neutrophils Absolute 4.8 1.4 - 7.0 x10E3/uL   Lymphocytes Absolute 2.5 0.7 - 3.1 x10E3/uL   MID (Absolute) 1.1 0.1 - 1.6 X10E3/uL  UA/M w/rflx Culture, Routine   Specimen: Urine   URINE  Result Value Ref Range   Specific Gravity, UA 1.015 1.005 -  1.030   pH, UA 5.0 5.0 - 7.5   Color, UA Yellow Yellow   Appearance Ur Clear Clear   Leukocytes,UA Negative Negative   Protein,UA Negative Negative/Trace   Glucose, UA Negative Negative   Ketones, UA Negative Negative   RBC, UA Negative Negative   Bilirubin, UA Negative Negative   Urobilinogen, Ur 0.2 0.2 - 1.0 mg/dL   Nitrite, UA Negative Negative      Assessment & Plan:   Problem List Items Addressed This Visit      Respiratory   Seasonal allergic rhinitis    Discussed importance of good daily allergy regimen for prevention of sxs. Will start daily zyrtec, flonase if needed additionally. Humidifier, sinus rinses and coricidin prn       Other Visit Diagnoses    Acute non-recurrent maxillary sinusitis    -  Primary   From poorly controlled allergies. Tx with augmentin, start good allergy regimen, follow up if not improving   Relevant Medications   cetirizine (ZYRTEC) 10 MG tablet   amoxicillin-clavulanate (AUGMENTIN) 875-125 MG tablet       Follow up plan: Return if symptoms worsen or fail to improve.

## 2019-11-28 NOTE — Chronic Care Management (AMB) (Signed)
  Chronic Care Management   Note  11/28/2019 Name: Cynthia Jones MRN: [id number] DOB: November 16, 1940  [name] is a 79 y.o. year old female who is a primary care patient of [name], [place]. The CCM team was consulted for assistance with chronic disease management and care coordination needs.    Attempted to contact patient/patient's daughter, [name], for medication management review as per referral from RN CM; however, patient has a virtual appointment today w/ provider. Was unable to leave voicemail for patient. Have messaged provider to discuss CCM pharmacy services with patient and to contact me if interested.   Catie Feliz Beam, PharmD, [facility] Clinical Pharmacist [facility] [phone]

## 2019-11-28 NOTE — Assessment & Plan Note (Signed)
Discussed importance of good daily allergy regimen for prevention of sxs. Will start daily zyrtec, flonase if needed additionally. Humidifier, sinus rinses and coricidin prn

## 2019-11-28 NOTE — Telephone Encounter (Signed)
Appt scheduled

## 2019-11-29 ENCOUNTER — Telehealth: Payer: Self-pay

## 2019-12-04 ENCOUNTER — Other Ambulatory Visit: Payer: Self-pay | Admitting: Family Medicine

## 2019-12-04 NOTE — Telephone Encounter (Signed)
Requested medication (s) are due for refill today -yes  Requested medication (s) are on the active medication list -yes  Future visit scheduled -yes  Last refill: 11/15/19  Notes to clinic: Change in pharmacy- non delegated  Requested Prescriptions  Pending Prescriptions Disp Refills   pregabalin (LYRICA) 50 MG capsule 90 capsule 2    Sig: Take 1 capsule (50 mg total) by mouth 3 (three) times daily.      Not Delegated - Neurology:  Anticonvulsants - Controlled Failed - 12/04/2019 11:21 AM      Failed - This refill cannot be delegated      Passed - Valid encounter within last 12 months    Recent Outpatient Visits           6 days ago Acute non-recurrent maxillary sinusitis   [facility] [name], [place]   2 weeks ago Fibromyalgia   [facility] Roosvelt Maser [place], [place]   1 month ago Fibromyalgia   [facility] Roosvelt Maser [place], [place]   1 month ago Anxiety   [facility] Roosvelt Maser [place], [place]   5 months ago Essential hypertension   [facility] [place], [place] Hews, [place]       Future Appointments             In 1 week Maurice March, [place], PA-C [employer]   In 1 month Delano Metz, MD [facility]                Requested Prescriptions  Pending Prescriptions Disp Refills   pregabalin (LYRICA) 50 MG capsule 90 capsule 2    Sig: Take 1 capsule (50 mg total) by mouth 3 (three) times daily.      Not Delegated - Neurology:  Anticonvulsants - Controlled Failed - 12/04/2019 11:21 AM      Failed - This refill cannot be delegated      Passed - Valid encounter within last 12 months    Recent Outpatient Visits           6 days ago Acute non-recurrent maxillary sinusitis   [facility] [name], [place]   2 weeks ago Fibromyalgia   [facility] Roosvelt Maser [place], [place]   1  month ago Fibromyalgia   [facility] Roosvelt Maser [place], [place]   1 month ago Anxiety   [facility] Roosvelt Maser [place], [place]   5 months ago Essential hypertension   [facility] [place], Salley Hews, [place]       Future Appointments             In 1 week Maurice March, [place] Hews, PA-C [employer]   In 1 month Delano Metz, MD [facility]

## 2019-12-04 NOTE — Telephone Encounter (Signed)
Medication Refill - Medication: pregabalin (LYRICA) 50 MG capsule  Has the patient contacted their pharmacy? Yes - Stacy at pharmacy contacting - states pt has switched pharmacies and this medication has no refills left. (Agent: If no, request that the patient contact the pharmacy for the refill.) (Agent: If yes, when and what did the pharmacy advise?)  Preferred Pharmacy (with phone number or street name):  [facility], [place] - [address] Phone:  [phone]  Fax:  [phone]     Agent: Please be advised that RX refills may take up to 3 business days. We ask that you follow-up with your pharmacy.

## 2019-12-08 ENCOUNTER — Ambulatory Visit (INDEPENDENT_AMBULATORY_CARE_PROVIDER_SITE_OTHER): Payer: Medicare HMO | Admitting: Licensed Clinical Social Worker

## 2019-12-08 DIAGNOSIS — F419 Anxiety disorder, unspecified: Secondary | ICD-10-CM

## 2019-12-08 DIAGNOSIS — F3342 Major depressive disorder, recurrent, in full remission: Secondary | ICD-10-CM

## 2019-12-08 DIAGNOSIS — I10 Essential (primary) hypertension: Secondary | ICD-10-CM

## 2019-12-08 DIAGNOSIS — F4321 Adjustment disorder with depressed mood: Secondary | ICD-10-CM

## 2019-12-08 DIAGNOSIS — M797 Fibromyalgia: Secondary | ICD-10-CM

## 2019-12-08 NOTE — Chronic Care Management (AMB) (Signed)
Chronic Care Management    Clinical Social Work Follow Up Note  12/08/2019 Name: Cynthia Jones MRN: [id number] DOB: 12-Jun-1941  Cynthia Jones is a 79 y.o. year old female who is a primary care patient of Volney American, [place]. The CCM team was consulted for assistance with Grief Counseling.   Review of patient status, including review of consultants reports, other relevant assessments, and collaboration with appropriate care team members and the patient's provider was performed as part of comprehensive patient evaluation and provision of chronic care management services.    SDOH (Social Determinants of Health) assessments performed: Yes    Outpatient Encounter Medications as of 12/08/2019  Medication Sig  . Acetaminophen (TYLENOL 8 HOUR PO) Take by mouth as needed.  . ALPRAZolam (XANAX) 0.25 MG tablet Take 1 tablet (0.25 mg total) by mouth 2 (two) times daily as needed. for anxiety (Patient not taking: Reported on 11/15/2019)  . amoxicillin-clavulanate (AUGMENTIN) 875-125 MG tablet Take 1 tablet by mouth 2 (two) times daily.  . ARIPiprazole (ABILIFY) 5 MG tablet Take 1 tablet (5 mg total) by mouth daily.  . carvedilol (COREG) 6.25 MG tablet Take 1 tablet (6.25 mg total) by mouth 2 (two) times daily with a meal.  . cetirizine (ZYRTEC) 10 MG tablet Take 1 tablet (10 mg total) by mouth daily.  . cyclobenzaprine (FLEXERIL) 5 MG tablet Take 0.5 tablets (2.5 mg total) by mouth 3 (three) times daily as needed for muscle spasms.  . diclofenac sodium (VOLTAREN) 1 % GEL Apply 2 g topically 4 (four) times daily.  . DULoxetine (CYMBALTA) 60 MG capsule TAKE 1 CAPSULE BY MOUTH EVERY DAY  . esomeprazole (NEXIUM) 40 MG capsule Take 40 mg by mouth daily at 12 noon.  [name] levothyroxine (SYNTHROID) 50 MCG tablet Take 1 tablet (50 mcg total) by mouth daily.  [name] losartan (COZAAR) 100 MG tablet TAKE 1 TABLET BY MOUTH EVERY DAY  . nystatin (MYCOSTATIN/NYSTOP) powder APPLY TO AFFECTED AREA 3 TIMES A DAY   . oxyCODONE (OXY IR/ROXICODONE) 5 MG immediate release tablet Take 1 tablet (5 mg total) by mouth every 8 (eight) hours as needed for severe pain. Must last 30 days  . oxyCODONE (OXY IR/ROXICODONE) 5 MG immediate release tablet Take 1 tablet (5 mg total) by mouth every 8 (eight) hours as needed for severe pain. Must last 30 days  . oxyCODONE (OXY IR/ROXICODONE) 5 MG immediate release tablet Take 1 tablet (5 mg total) by mouth every 8 (eight) hours as needed for severe pain. Must last 30 days  . pregabalin (LYRICA) 50 MG capsule Take 1 capsule (50 mg total) by mouth 3 (three) times daily.  . Vitamin D, Ergocalciferol, (DRISDOL) 1.25 MG (50000 UNIT) CAPS capsule Take 1 capsule (50,000 Units total) by mouth every 7 (seven) days.   No facility-administered encounter medications on file as of 12/08/2019.     Goals Addressed    . SW: "Mom's anxiety and depression are getting worse since her brother passed." (pt-stated)       Current Barriers:  . Chronic Mental Health needs related to depression, anxiety and most recently- grief . Limited social support . ADL IADL limitations . Mental Health Concerns  . Social Isolation . Memory Deficits . Suicidal Ideation/Homicidal Ideation: No  Clinical Social Work Goal(s):  [name] Over the next 120 days, patient will work with SW  bi-monthly  by telephone or in person to reduce or manage symptoms related to depression, grief and anxiety . Over the next 120 days,  patient will demonstrate improved health management independence as evidenced by implementing appropriate self-care and mental health coping tools into her daily routine to combat stressors.  Interventions: . Patient interviewed and appropriate assessments performed: brief mental health assessment . Patient reports that recent medication adjustment by PCP has been helpful to her chronic pain. . Patient declines any further issues with allergies since PCP prescribed her amoxicillin.  . Patient continues to  reside with daughter [name] who provides ongoing care giving and supervision. Christine reports filing patient's pill boxes.  . Patient interviewed and appropriate assessments performed . Provided mental health counseling with regard to anxiety, depression, grief support  . Provided patient with information about available grief support resources within the area ([facility] and [facility]) email sent with this information to patient's family member. Patient is actively receiving grief therapy through [facility]. Patient has connected well with her counselor Jeanice Lim and has completed 3 sessions already. . Discussed plans with patient for ongoing care management follow up and provided patient with direct contact information for care management team . Advised patient to stay in close communication with [facility] for grief counseling and palliative care  . Collaborated with primary care provider re: medication concerns . Assisted patient/caregiver with obtaining information about health plan benefits . Provided education and assistance to client regarding Advanced Directives. . Provided education to patient/caregiver regarding level of care options. . Provided education to patient/caregiver about Hospice and/or Palliative Care services . Referred patient to [facility] (mental health provider) for grief follow up and therapy/counseling. Family only wants virtual therapy at this time due to mobility issues. Earlyne Iba with MD re: grief therapy referral made through [facility] on 10/30/19 as patient's brother passed away today.  Update- Patient is actively receiving counseling services.   Patient Self Care Activities:  [name] Motivation for treatment . Strong family or social support  Patient Coping Strengths:  . Supportive Relationships . Family . Hopefulness  Patient Self Care Deficits:  . Lacks social connections . Unable to perform ADLs independently . Unable to perform IADLs  independently  Please see past updates related to this goal by clicking on the "Past Updates" button in the selected goal       Depression screen [facility] 2/9 12/08/2019 11/15/2019 10/16/2019 10/13/2019 06/16/2019  Decreased Interest 0 0 2 1 1   Down, Depressed, Hopeless 1 0 2 1 1   PHQ - 2 Score 1 0 4 2 2   Altered sleeping - 0 3 0 0  Tired, decreased energy - 1 3 1 1   Change in appetite - 0 2 0 1  Feeling bad or failure about yourself  - 0 1 0 0  Trouble concentrating - 0 2 0 0  Moving slowly or fidgety/restless - 0 3 0 0  Suicidal thoughts - 0 0 0 0  PHQ-9 Score - 1 18 3 4   Difficult doing work/chores - - Somewhat difficult - Not difficult at all  Some recent data might be hidden    Follow Up Plan: SW will follow up with patient by phone over the next quarter   , BSW, MSW, LCSW Family Practice/[facility] Care Management [place]  [facility] [email or web address] Phone: [phone]

## 2019-12-11 ENCOUNTER — Other Ambulatory Visit: Payer: Medicare HMO | Admitting: Primary Care

## 2019-12-11 ENCOUNTER — Other Ambulatory Visit: Payer: Self-pay

## 2019-12-11 DIAGNOSIS — R531 Weakness: Secondary | ICD-10-CM

## 2019-12-11 DIAGNOSIS — Z515 Encounter for palliative care: Secondary | ICD-10-CM | POA: Diagnosis not present

## 2019-12-11 NOTE — Progress Notes (Signed)
[facility] Consult Note Telephone: [phone]  Fax: [phone]    PATIENT NAME: Cynthia Jones [address] [place] [address] [phone] (home)  DOB: 1941-06-28 MRN: [id number]  PRIMARY CARE PROVIDER:   Nyra Capes, [place] [address] [phone]  REFERRING PROVIDER:  Nyra Capes [address] [place],  [place] [address] [phone]  RESPONSIBLE PARTY:   Extended Emergency Contact Information Primary Emergency Contact: Lanae Crumbly Mobile Phone: [phone] Relation: Daughter Secondary Emergency Contact: Beman,Ray Address: [address]          [place], [place] [address] Johnnette Litter of [place] Phone: [phone] Relation: Son   I met with patient and family in the home.  ASSESSMENT AND RECOMMENDATIONS:   1. Advance Care Planning/Goals of Care: Goals include to maximize quality of life and symptom management. Has DNR on file, MOST  With DNR, limited interventions, abx, limited IV and limited feeding tube. Reviewed with family.   2. Symptom Management:   Pain: Daughter states she has stopped the flexeril and Lyrica and returned to.oxycodone 5 mg q 8 hours and xanax 0.25 mg bid. Recomended Tylenol arthritis 650 mg - 1300 mg q 8 hrs again. They did not obtain last week but will this week. Recommended talking to pain management about fentanyl patches or long acting xtampza.   Mobility:  Outpatient PT in [facility] called but she has moved to [place] [place]. Has had increased weakness, falls and moved to a new location with new mobility challenges. Needs Home Health Referral sent to [facility] in [place]  which serves her area.  3. Family /Caregiver/Community Supports: Lives with daughter in [place].  4. Cognitive / Functional decline: Alert, oriented x 2-3. Not able to get out of recliner (I), uses walker with stand by.  5. Follow up Palliative Care  Visit: Palliative care will continue to follow for goals of care clarification and symptom management. Return 6 weeks or prn.  I spent 60 minutes providing this consultation,  from 1530 to 1630. More than 50% of the time in this consultation was spent coordinating communication.   HISTORY OF PRESENT ILLNESS:  Cynthia Jones is a 79 y.o. year old female with multiple medical problems including r/o cva, depression, DM, HTN, obesity, OA, RA, fibromyalgia. Palliative Care was asked to follow this patient by consultation request of Volney American,* to help address advance care planning and goals of care. This is a follow up visit.  CODE STATUS: DNR, Limited MOST, use of abx and iv, limited feeding tube  PPS: 40% HOSPICE ELIGIBILITY/DIAGNOSIS: no  PAST MEDICAL HISTORY:  Past Medical History:  Diagnosis Date  . Acute anxiety 03/06/2015  . Allergy   . Anxiety   . Degenerative joint disease of knee   . Depression   . Fibromyalgia   . Obesity   . Osteopenia   . RAD (reactive airway disease)     SOCIAL HX:  Social History   Tobacco Use  . Smoking status: Never Smoker  . Smokeless tobacco: Never Used  Substance Use Topics  . Alcohol use: No    Alcohol/week: 0.0 standard drinks    ALLERGIES:  Allergies  Allergen Reactions  . Nsaids Other (See Comments)    GI upset, hx of H pylori and gastric ulcer  . Erythromycin Nausea Only     PERTINENT MEDICATIONS:  Outpatient Encounter Medications as of 12/11/2019  Medication Sig  . Acetaminophen (TYLENOL 8  HOUR PO) Take by mouth as needed.  . ALPRAZolam (XANAX) 0.25 MG tablet Take 1 tablet (0.25 mg total) by mouth 2 (two) times daily as needed. for anxiety (Patient not taking: Reported on 11/15/2019)  . amoxicillin-clavulanate (AUGMENTIN) 875-125 MG tablet Take 1 tablet by mouth 2 (two) times daily.  . ARIPiprazole (ABILIFY) 5 MG tablet Take 1 tablet (5 mg total) by mouth daily.  . carvedilol (COREG) 6.25 MG tablet Take 1 tablet  (6.25 mg total) by mouth 2 (two) times daily with a meal.  . cetirizine (ZYRTEC) 10 MG tablet Take 1 tablet (10 mg total) by mouth daily.  . cyclobenzaprine (FLEXERIL) 5 MG tablet Take 0.5 tablets (2.5 mg total) by mouth 3 (three) times daily as needed for muscle spasms.  . diclofenac sodium (VOLTAREN) 1 % GEL Apply 2 g topically 4 (four) times daily.  . DULoxetine (CYMBALTA) 60 MG capsule TAKE 1 CAPSULE BY MOUTH EVERY DAY  . esomeprazole (NEXIUM) 40 MG capsule Take 40 mg by mouth daily at 12 noon.  [name] levothyroxine (SYNTHROID) 50 MCG tablet Take 1 tablet (50 mcg total) by mouth daily.  [name] losartan (COZAAR) 100 MG tablet TAKE 1 TABLET BY MOUTH EVERY DAY  . nystatin (MYCOSTATIN/NYSTOP) powder APPLY TO AFFECTED AREA 3 TIMES A DAY  . oxyCODONE (OXY IR/ROXICODONE) 5 MG immediate release tablet Take 1 tablet (5 mg total) by mouth every 8 (eight) hours as needed for severe pain. Must last 30 days  . oxyCODONE (OXY IR/ROXICODONE) 5 MG immediate release tablet Take 1 tablet (5 mg total) by mouth every 8 (eight) hours as needed for severe pain. Must last 30 days  . oxyCODONE (OXY IR/ROXICODONE) 5 MG immediate release tablet Take 1 tablet (5 mg total) by mouth every 8 (eight) hours as needed for severe pain. Must last 30 days  . pregabalin (LYRICA) 50 MG capsule Take 1 capsule (50 mg total) by mouth 3 (three) times daily.  . Vitamin D, Ergocalciferol, (DRISDOL) 1.25 MG (50000 UNIT) CAPS capsule Take 1 capsule (50,000 Units total) by mouth every 7 (seven) days.   No facility-administered encounter medications on file as of 12/11/2019.    PHYSICAL EXAM / ROS:   Current and past weights: stable General: NAD, frail appearing, obese Cardiovascular: no chest pain reported, no edema Pulmonary: no cough, no increased SOB Abdomen: appetite good, denies constipation, incontinent of bowel GU: denies dysuria, incontinent of urine MSK:  no joint deformities, ambulatory with walker, one fall in the night Skin: no  rashes or wounds reported Neurological: Weakness, endorses chronic pain, sleeping better with oxycodone. Does not take melatonin. Endorses 10/10 pain when on knee. Endorses 8/10 now in L knee. Best number is 6/10.  Jason Coop, NP [facility]  COVID-19 PATIENT SCREENING TOOL  Person answering questions: ___________daughter_______ _____   1.  Is the patient or any family member in the home showing any signs or symptoms regarding respiratory infection?               Person with Symptom- __________NA_________________  a. Fever                                                                          Yes___ No___  ___________________  b. Shortness of breath                                                    Yes___ No___          ___________________ c. Cough/congestion                                       Yes___  No___         ___________________ d. Body aches/pains                                                         Yes___ No___        ____________________ e. Gastrointestinal symptoms (diarrhea, nausea)           Yes___ No___        ____________________  2. Within the past 14 days, has anyone living in the home had any contact with someone with or under investigation for COVID-19?    Yes___ No_X_   Person __________________

## 2019-12-13 NOTE — Addendum Note (Signed)
Addended by: Roosvelt Maser E on: 12/13/2019 12:38 PM   Modules accepted: Orders

## 2019-12-15 ENCOUNTER — Ambulatory Visit (INDEPENDENT_AMBULATORY_CARE_PROVIDER_SITE_OTHER): Payer: Medicare HMO | Admitting: Family Medicine

## 2019-12-15 ENCOUNTER — Encounter: Payer: Self-pay | Admitting: Family Medicine

## 2019-12-15 VITALS — BP 143/57 | HR 72

## 2019-12-15 DIAGNOSIS — G8929 Other chronic pain: Secondary | ICD-10-CM

## 2019-12-15 DIAGNOSIS — M25562 Pain in left knee: Secondary | ICD-10-CM

## 2019-12-15 DIAGNOSIS — R531 Weakness: Secondary | ICD-10-CM | POA: Diagnosis not present

## 2019-12-15 DIAGNOSIS — M797 Fibromyalgia: Secondary | ICD-10-CM

## 2019-12-15 MED ORDER — PREGABALIN 75 MG PO CAPS: 75.0000 mg | ORAL_CAPSULE | Freq: Two times a day (BID) | ORAL | 2 refills | Status: DC

## 2019-12-15 MED ORDER — CYCLOBENZAPRINE HCL 5 MG PO TABS: 2.5000 mg | ORAL_TABLET | Freq: Three times a day (TID) | ORAL | 1 refills | Status: DC | PRN

## 2019-12-15 NOTE — Progress Notes (Signed)
BP (!) 143/57   Pulse 72    Subjective:    Patient ID: Cynthia Jones, female    DOB: [birth year], 79 y.o.   MRN: [id number]  HPI: Cynthia Jones is a 79 y.o. female  Chief Complaint  Patient presents with  . Fibromyalgia    . This visit was completed via telephone due to the restrictions of the COVID-19 pandemic. All issues as above were discussed and addressed. Physical exam was done as above through visual confirmation on telephone. If it was felt that the patient should be evaluated in the office, they were directed there. The patient verbally consented to this visit. . Location of the patient: home . Location of the provider: work . Those involved with this call:  . Provider: Roosvelt Maser, PA-C . CMA: Elton Sin, CMA . Front Desk/Registration: Harriet Pho  . Time spent on call: 25 minutes on the phone discussing health concerns. 5 minutes total spent in review of patient's record and preparation of their chart. I verified patient identity using two factors (patient name and date of birth). Patient consents verbally to being seen via telemedicine visit today.   Here today for pain/fibromyalgia f/u. Only now having left knee pain which is known to be arthritic, overall feeling much better. Lyrica and flexeril seem to be working very well. Tried oxycodone instead of lyrica for 2 days last week but was lethargic and not able to function. Wanting to stay off that and on the lyrica regimen. Has appt 4/20 with [facility] in [place] for pain mgmt  Had a fall in the middle of the night last week due to right sided weakness persisting, EMS had to help her get up but did not go to hospital. Has orders placed for home health PT to help with that.   Relevant past medical, surgical, family and social history reviewed and updated as indicated. Interim medical history since our last visit reviewed. Allergies and medications reviewed and updated.  Review of Systems  Per HPI  unless specifically indicated above     Objective:    BP (!) 143/57   Pulse 72   Wt Readings from Last 3 Encounters:  11/15/19 222 lb (100.7 kg)  10/27/19 223 lb (101.2 kg)  10/16/19 221 lb (100.2 kg)    Physical Exam  Unable to perform PE due to patient lack of access to video technology for today's visit.   Results for orders placed or performed in visit on 10/27/19  CBC With Differential/Platelet  Result Value Ref Range   WBC 8.4 3.4 - 10.8 x10E3/uL   RBC 4.17 3.77 - 5.28 x10E6/uL   Hemoglobin 13.2 11.1 - 15.9 g/dL   Hematocrit 69.6 78.9 - 46.6 %   MCV 93 79 - 97 fL   MCH 31.7 26.6 - 33.0 pg   MCHC 33.9 31.5 - 35.7 g/dL   RDW 38.1 01.7 - 51.0 %   Platelets 280 150 - 450 x10E3/uL   Neutrophils 57 Not Estab. %   Lymphs 30 Not Estab. %   MID 13 Not Estab. %   Neutrophils Absolute 4.8 1.4 - 7.0 x10E3/uL   Lymphocytes Absolute 2.5 0.7 - 3.1 x10E3/uL   MID (Absolute) 1.1 0.1 - 1.6 X10E3/uL  UA/M w/rflx Culture, Routine   Specimen: Urine   URINE  Result Value Ref Range   Specific Gravity, UA 1.015 1.005 - 1.030   pH, UA 5.0 5.0 - 7.5   Color, UA Yellow Yellow   Appearance Ur  Clear Clear   Leukocytes,UA Negative Negative   Protein,UA Negative Negative/Trace   Glucose, UA Negative Negative   Ketones, UA Negative Negative   RBC, UA Negative Negative   Bilirubin, UA Negative Negative   Urobilinogen, Ur 0.2 0.2 - 1.0 mg/dL   Nitrite, UA Negative Negative      Assessment & Plan:   Problem List Items Addressed This Visit      Other   Fibromyalgia - Primary (Chronic)    Significant benefit on lyrica, cymbalta flexeril regimen and has been able to come off oxycodone regimen. Continue current regimen      Relevant Medications   pregabalin (LYRICA) 75 MG capsule   cyclobenzaprine (FLEXERIL) 5 MG tablet   Chronic knee pain (Left) (Chronic)    Establishing with orthopedics soon for pain mgmt, will defer to their mgmt of this issue and in meantime take voltaren gel prn  and manage weight      Relevant Medications   pregabalin (LYRICA) 75 MG capsule   cyclobenzaprine (FLEXERIL) 5 MG tablet    Other Visit Diagnoses    Weakness       Orders for home health PT placed, continue working on strength, balance and pain control       Follow up plan: Return in about 3 months (around 03/15/2020) for Chronic condition, pain f/u.

## 2019-12-19 DIAGNOSIS — M069 Rheumatoid arthritis, unspecified: Secondary | ICD-10-CM | POA: Diagnosis not present

## 2019-12-19 DIAGNOSIS — E069 Thyroiditis, unspecified: Secondary | ICD-10-CM | POA: Insufficient documentation

## 2019-12-19 DIAGNOSIS — M25562 Pain in left knee: Secondary | ICD-10-CM | POA: Diagnosis not present

## 2019-12-19 DIAGNOSIS — M797 Fibromyalgia: Secondary | ICD-10-CM | POA: Diagnosis not present

## 2019-12-19 DIAGNOSIS — M25561 Pain in right knee: Secondary | ICD-10-CM | POA: Diagnosis not present

## 2019-12-19 DIAGNOSIS — M170 Bilateral primary osteoarthritis of knee: Secondary | ICD-10-CM | POA: Diagnosis not present

## 2019-12-19 DIAGNOSIS — M545 Low back pain: Secondary | ICD-10-CM | POA: Diagnosis not present

## 2019-12-21 ENCOUNTER — Telehealth: Payer: Self-pay | Admitting: *Deleted

## 2019-12-21 ENCOUNTER — Telehealth: Payer: Self-pay

## 2019-12-21 NOTE — Assessment & Plan Note (Signed)
Establishing with orthopedics soon for pain mgmt, will defer to their mgmt of this issue and in meantime take voltaren gel prn and manage weight

## 2019-12-21 NOTE — Telephone Encounter (Signed)
Copied from CRM [id number]. Topic: General - Other >> Dec 21, 2019  9:26 AM Gwenlyn Fudge wrote: Reason for CRM: Pt called and is requesting a call back from PCP. Pt states that she needs to update PCP on her new medications. Please advise.   Called and spoke to patient and very daughter. Patient gave verbal permission to speak with her daughter. Patient's daughter states that they have decided to stop going to pain management (see note in chart). Daughter states that the patient is no longer taking oxycodone, only takes the Lyrica and Cyclobenazeprine prescribed by [place]. They want to make sure that [name] is ok with this and will still prescribe medications. They state that they do not want to step on any toes but pain management was not doing anything for the patient since she is not taking the Oxy. States that [name] has done the best at managing the patient's pain anyway and they just want to make sure she is ok with this.

## 2019-12-21 NOTE — Telephone Encounter (Signed)
Called patient to complete pre-VV questions. Spoke with daughter who informed me that at this time Mrs. Briceno is living with her due to some decline in her mother's ability to take care of herself.She did complete the drug holiday as suggested by Dr. Laban Emperor and now she feels as though she does not not need the narcotics at this time. The daughter also informed me that the patient's PCP is prescribing her LYRICA and Flexeril and this seems to be helping her with her pain related issues. At this time the family would like to discontinue care here at the pain clinic but would like to return if our services are needed.  I informed her that we would cancel her Monday, 12/25/19 appointment and she should call us if needed in the future, and we would see her if needed. Daughter was very appreciative of the care her Mom has received here. Olegario Messier was notified to cancel 12/25/19 appointment.

## 2019-12-21 NOTE — Assessment & Plan Note (Signed)
Significant benefit on lyrica, cymbalta flexeril regimen and has been able to come off oxycodone regimen. Continue current regimen

## 2019-12-22 NOTE — Telephone Encounter (Signed)
Patient notified

## 2019-12-22 NOTE — Telephone Encounter (Signed)
Absolutely fine continuing to manage this regimen for her, will just see her back every 3 months to manage

## 2019-12-25 ENCOUNTER — Other Ambulatory Visit: Payer: Self-pay | Admitting: Family Medicine

## 2019-12-25 ENCOUNTER — Telehealth: Payer: Medicare HMO | Admitting: Pain Medicine

## 2019-12-25 DIAGNOSIS — E118 Type 2 diabetes mellitus with unspecified complications: Secondary | ICD-10-CM

## 2019-12-25 MED ORDER — NYSTATIN 100000 UNIT/GM EX POWD: CUTANEOUS | 1 refills | Status: DC

## 2019-12-25 NOTE — Telephone Encounter (Signed)
Medication Refill - Medication: nystatin (MYCOSTATIN/NYSTOP) powder   Has the patient contacted their pharmacy? Yes.   (Agent: If no, request that the patient contact the pharmacy for the refill.) (Agent: If yes, when and what did the pharmacy advise?)  Preferred Pharmacy (with phone number or street name):  [facility], [place] - [address] Phone:  [phone]  Fax:  [phone]       Agent: Please be advised that RX refills may take up to 3 business days. We ask that you follow-up with your pharmacy.

## 2019-12-25 NOTE — Telephone Encounter (Signed)
Requested medication (s) are due for refill today: yes  Requested medication (s) are on the active medication list: yes  Last refill:  10/03/19  Future visit scheduled: no  Notes to clinic:  no assigned protocol    Requested Prescriptions  Pending Prescriptions Disp Refills   nystatin (MYCOSTATIN/NYSTOP) powder 60 g 1    Sig: APPLY TO AFFECTED AREA 3 TIMES A DAY      Off-Protocol Failed - 12/25/2019  2:06 PM      Failed - Medication not assigned to a protocol, review manually.      Passed - Valid encounter within last 12 months    Recent Outpatient Visits           1 week ago Fibromyalgia   [facility] Roosvelt Maser [place], [place]   3 weeks ago Acute non-recurrent maxillary sinusitis   [facility] Roosvelt Maser [place], [place]   1 month ago Fibromyalgia   [facility] Roosvelt Maser [place], [place]   1 month ago Fibromyalgia   [facility] Roosvelt Maser [place], [place]   2 months ago Anxiety   [facility] Roosvelt Maser [place], [place]

## 2019-12-26 ENCOUNTER — Other Ambulatory Visit: Payer: Self-pay | Admitting: Family Medicine

## 2019-12-26 DIAGNOSIS — Z1231 Encounter for screening mammogram for malignant neoplasm of breast: Secondary | ICD-10-CM

## 2019-12-29 ENCOUNTER — Telehealth: Payer: Self-pay | Admitting: General Practice

## 2019-12-29 ENCOUNTER — Ambulatory Visit: Payer: Self-pay | Admitting: General Practice

## 2019-12-29 DIAGNOSIS — F4321 Adjustment disorder with depressed mood: Secondary | ICD-10-CM

## 2019-12-29 DIAGNOSIS — F3342 Major depressive disorder, recurrent, in full remission: Secondary | ICD-10-CM | POA: Diagnosis not present

## 2019-12-29 DIAGNOSIS — G8929 Other chronic pain: Secondary | ICD-10-CM

## 2019-12-29 DIAGNOSIS — F419 Anxiety disorder, unspecified: Secondary | ICD-10-CM

## 2019-12-29 DIAGNOSIS — G894 Chronic pain syndrome: Secondary | ICD-10-CM

## 2019-12-29 DIAGNOSIS — I10 Essential (primary) hypertension: Secondary | ICD-10-CM

## 2019-12-29 DIAGNOSIS — M797 Fibromyalgia: Secondary | ICD-10-CM

## 2019-12-29 DIAGNOSIS — M545 Low back pain, unspecified: Secondary | ICD-10-CM

## 2019-12-29 DIAGNOSIS — R531 Weakness: Secondary | ICD-10-CM

## 2019-12-29 NOTE — Chronic Care Management (AMB) (Signed)
Chronic Care Management   Follow Up Note   12/29/2019 Name: JENEAL VOGL MRN: [id number] DOB: Apr 15, 1941  Referred by: [name], PA-C Reason for referral : Chronic Care Management (Follow up: Depression/Anxiety/Chronic Pain/HTN- new changes)   ARLETA OSTRUM is a 79 y.o. year old female who is a primary care patient of [name], [place]. The CCM team was consulted for assistance with chronic disease management and care coordination needs.    Review of patient status, including review of consultants reports, relevant laboratory and other test results, and collaboration with appropriate care team members and the patient's provider was performed as part of comprehensive patient evaluation and provision of chronic care management services.    SDOH (Social Determinants of Health) assessments performed: No See Care Plan activities for detailed interventions related to [facility])     Outpatient Encounter Medications as of 12/29/2019  Medication Sig  . Acetaminophen (TYLENOL 8 HOUR PO) Take by mouth as needed.  . ALPRAZolam (XANAX) 0.25 MG tablet Take 1 tablet (0.25 mg total) by mouth 2 (two) times daily as needed. for anxiety  . ARIPiprazole (ABILIFY) 5 MG tablet Take 1 tablet (5 mg total) by mouth daily.  . carvedilol (COREG) 6.25 MG tablet Take 1 tablet (6.25 mg total) by mouth 2 (two) times daily with a meal.  . cyclobenzaprine (FLEXERIL) 5 MG tablet Take 0.5 tablets (2.5 mg total) by mouth 3 (three) times daily as needed for muscle spasms.  . diclofenac sodium (VOLTAREN) 1 % GEL Apply 2 g topically 4 (four) times daily.  . DULoxetine (CYMBALTA) 60 MG capsule TAKE 1 CAPSULE BY MOUTH EVERY DAY  . esomeprazole (NEXIUM) 40 MG capsule Take 40 mg by mouth daily at 12 noon.  [name] levothyroxine (SYNTHROID) 50 MCG tablet Take 1 tablet (50 mcg total) by mouth daily.  [name] losartan (COZAAR) 100 MG tablet TAKE 1 TABLET BY MOUTH EVERY DAY  . nystatin (MYCOSTATIN/NYSTOP) powder  APPLY TO AFFECTED AREA 3 TIMES A DAY  . pregabalin (LYRICA) 75 MG capsule Take 1 capsule (75 mg total) by mouth 2 (two) times daily.  [name] UNABLE TO FIND Take by mouth as needed. [place] HBP  . Vitamin D, Ergocalciferol, (DRISDOL) 1.25 MG (50000 UNIT) CAPS capsule Take 1 capsule (50,000 Units total) by mouth every 7 (seven) days.   No facility-administered encounter medications on file as of 12/29/2019.     Objective:  BP Readings from Last 3 Encounters:  12/15/19 (!) 143/57  11/15/19 (!) 133/51  10/27/19 (!) 171/95   Goals Addressed            This Visit's Progress   . RNCM: I am having a hard time adjusting to moving in with my daughter       Current Barriers:  . Chronic Disease Management support, education, and care coordination needs related to HTN, Anxiety, Depression, and Chronic Pain . Grief and mourning over the loss of her husband and having to move in with her daughter Nov 19, 2022 is her husbands death anniversary). The patients last living sibling passed away on 11/23/2019.   Clinical Goal(s) related to HTN, Anxiety, Depression, and Chronic Pain :  Over the next 120 days, patient will:  . Work with the care management team to address educational, disease management, and care coordination needs  . Begin or continue self health monitoring activities as directed today Measure and record blood pressure 4 times per week . Call provider office for new or worsened signs and symptoms Blood pressure  findings outside established parameters and New or worsened symptom related to Depression, Anxiety or Chronic Pain . Call care management team with questions or concerns . Verbalize basic understanding of patient centered plan of care established today  Interventions related to HTN, Anxiety, Depression, and Chronic Pain :  . Evaluation of current treatment plans and patient's adherence to plan as established by provider.  The patient is happy with her new pcp. She has decided that Merrie Roof, PA-C is doing a better job at managing her pain that the pain clinic. The patient decided she was going to let the pcp manage her pain and not go back to the pain clinic. She feels much better than she did. She is thankful for the support and help she has received from the pcp and staff.  . Assessed patient understanding of disease states . Assessed patient's education and care coordination needs.  The patient denies any new concerns at this time. The patient states she is enjoying "porch sitting" since the weather has gotten nicer. She has not started working with PT in her home yet but feels this will be a great benefit for her. She is happy that she finally feels better.  . Provided disease specific education to patient.  The patient reports a good appetite. The patient denies any unintentional weight loss. The patient verbalized she is able to manage well and is adjusting well with living with her daughter and family.  Nash Dimmer with appropriate clinical care team members regarding patient needs.  The patient continues to work with the CCM team and knows when to reach out for questions or concerns.  . Advised the patient and the patient daughter to take medications as prescribed.  Patient Self Care Activities related to HTN, Anxiety, Depression, and Chronic Pain :  . Patient is unable to independently self-manage chronic health conditions  Please see past updates related to this goal by clicking on the "Past Updates" button in the selected goal      . COMPLETED: RNCM: I have not gotten the Covid 19 shot       Current Barriers: Completed Goal- the patient has had both COVID vaccines . Knowledge Deficits related to COVID-19 and impact on patient self health management  Clinical Goal(s):  [name] Over the next 30 days, patient will verbalize basic understanding of COVID-19 impact on individual health and self health management as evidenced by verbalization of basic understanding of COVID-19 as  a viral disease, measures to prevent exposure, signs and symptoms, when to contact provider  Interventions: . Provided education to patient re: COVID19 wait list and with permission from the patient added her to the [employer] . Discussed plans with patient for ongoing care management follow up and provided patient with direct contact information for care management team . Provided patient and/or caregiver with COVID19 information about Vaccination places in [facility] . Provided patient with COVID19 educational materials related to Vaccination and COVID19 information by [place] system  Patient Self Care Activities:  . Patient verbalizes understanding of plan to receive a call when she can make an appointment to get the [place] vaccination . Attends all scheduled provider appointments . Calls provider office for new concerns or questions  Initial goal documentation                   . COMPLETED: RNCM: I need help with getting my medication       Current Barriers: completed goal. The patient has her  medications and is doing well . Knowledge Deficits related to obtaining refill for xanax . Chronic Disease Management support and education needs related to depression and anxiety  Nurse Case Manager Clinical Goal(s):  [name] Over the next 90 days, patient will verbalize understanding of plan for ,managing depression and anxiety effectively  . Over the next 90 days, patient will work with CCM team and pcp to address needs related to Depression and anxiety . Over the next 90 days, patient will demonstrate a decrease in anxiety  exacerbations as evidenced by ability to effectively cope with life altering changes of not being in the home that she and her husband shared for over 50 years . Over the next 90 days, patient will attend all scheduled medical appointments: appointment on Monday, 10-16-2019  . Over the next 90 days, patient will verbalize basic  understanding of chronic disease process and self health management plan as evidenced by coping mechanisms to help her manage her anxiety and depression . Over the next 60 days, patient will work with CM team pharmacist to reconcile medication and questions concerns about COVID19 vaccine . Over the next 60 days, patient will work with CM clinical social worker to help with depression and anxiety   Interventions:  . Evaluation of current treatment plan related to depression and anxiety  and patient's adherence to plan as established by provider. . Discussed plans with patient for ongoing care management follow up and provided patient with direct contact information for care management team . Provided patient with depression and anxiety  educational materials related to coping skills  . Reviewed scheduled/upcoming provider appointments including:  . Social Work referral for assessment of depression and anxiety  . Pharmacy referral for medication review and questions related to COVID19 . Secure message sent to pcp letting the pcp know the patient was out of xanax. A script will be sent electronically to the pharmacy the patient uses.  Patient Self Care Activities:  . Patient verbalizes understanding of plan to work with CCM team to assist in needs related to depression and anxiety and other concerns . Attends all scheduled provider appointments . Calls provider office for new concerns or questions . Unable to independently manage depression and anxiety due to the death of her nephew recently and moving in with her daughter.  The patient is tearful about having to leave her home that she and her husband shared for over 50 years.  Initial goal documentation     . RNCM: Mom is having a really hard time managing her pain and discomfort       CARE PLAN ENTRY (see longtitudinal plan of care for additional care plan information)  Current Barriers:  [name] Knowledge Deficits related to how to effectively  manage pain as evidence by unresolved pain in patient with chronic pain syndrome, depression and anxiety . Unresolved grief, depression and anxiety as evidence of losing her spouse and having to move in with her daughter, recent death of her last living sibling on 10/29/2019  Nurse Case Manager Clinical Goal(s):  [name] Over the next 60 days, patient will verbalize understanding of plan for managing pain effectively, having decreased episodes of depression and anxiety . Over the next 60 days, patient will work with [facility] and pcp to address needs related to pain relief measures and dealing with her depression and anxiety  . Over the next 60 days, patient will demonstrate a decrease in pain exacerbations as evidenced by controlled pain . Over the next 60 days,  patient will attend all scheduled medical appointments: Front office staff to make an appointment for the patient to follow up with pcp  Interventions:  . Evaluation of current treatment plan related to pain control and patient's adherence to plan as established by provider. . Provided education to patient re: if pain is unresolved and worse to go to the emergency room to be evaluated and treated . Reviewed medications with patient and discussed compliance- the patient verbalized the Lyrica is doing a much better job at controlling her pain. She was going to the pain clinic but she says they were not helping her. She consulted with the pcp and has decided to let the pcp manage her pain and discomfort. She is happy with this decision.  . Evaluation of home health services.  The patient states the home health services has not started yet but she is hopeful that soon they will. She believes this will help her get stronger and able to move better.  . Provided patient with pain educational materials related to non-pharmacological ways to manage pain and discomfort-completed . Reviewed scheduled/upcoming provider appointments including: saw the pcp on  12-15-2019, will follow up per the patient in July. Denies any needs at this time.  [name] Pharmacy consult for support and recommendations- completed.  The pharmacist is available for assistance as needed  . Social worker support for effective management of grief and depression.  The patient is currently working with the LCSW   Patient Self Care Activities:  . Patient verbalizes understanding of plan to work with the interdisciplinary team to help patient manage her pain, depression and anxiety more effectively . Self administers medications as prescribed . Attends all scheduled provider appointments . Calls provider office for new concerns or questions . Unable to independently manage pain, depression and anxiety related to chronic conditions . Unable to perform IADLs independently  Please see past updates related to this goal by clicking on the "Past Updates" button in the selected goal          Plan:   The care management team will reach out to the patient again over the next 60 days.    Alto [place] RN, MSN, CCM Community Care Coordinator [place]  [facility] Mobile: [phone]

## 2019-12-29 NOTE — Patient Instructions (Signed)
Visit Information  Goals Addressed            This Visit's Progress   . RNCM: I am having a hard time adjusting to moving in with my daughter       Current Barriers:  . Chronic Disease Management support, education, and care coordination needs related to HTN, Anxiety, Depression, and Chronic Pain . Grief and mourning over the loss of her husband and having to move in with her daughter November 20, 2022 is her husbands death anniversary). The patients last living sibling passed away on 11/24/2019.   Clinical Goal(s) related to HTN, Anxiety, Depression, and Chronic Pain :  Over the next 120 days, patient will:  . Work with the care management team to address educational, disease management, and care coordination needs  . Begin or continue self health monitoring activities as directed today Measure and record blood pressure 4 times per week . Call provider office for new or worsened signs and symptoms Blood pressure findings outside established parameters and New or worsened symptom related to Depression, Anxiety or Chronic Pain . Call care management team with questions or concerns . Verbalize basic understanding of patient centered plan of care established today  Interventions related to HTN, Anxiety, Depression, and Chronic Pain :  . Evaluation of current treatment plans and patient's adherence to plan as established by provider.  The patient is happy with her new pcp. She has decided that Merrie Roof, PA-C is doing a better job at managing her pain that the pain clinic. The patient decided she was going to let the pcp manage her pain and not go back to the pain clinic. She feels much better than she did. She is thankful for the support and help she has received from the pcp and staff.  . Assessed patient understanding of disease states . Assessed patient's education and care coordination needs.  The patient denies any new concerns at this time. The patient states she is enjoying "porch sitting" since  the weather has gotten nicer. She has not started working with PT in her home yet but feels this will be a great benefit for her. She is happy that she finally feels better.  . Provided disease specific education to patient.  The patient reports a good appetite. The patient denies any unintentional weight loss. The patient verbalized she is able to manage well and is adjusting well with living with her daughter and family.  Nash Dimmer with appropriate clinical care team members regarding patient needs.  The patient continues to work with the CCM team and knows when to reach out for questions or concerns.  . Advised the patient and the patient daughter to take medications as prescribed.  Patient Self Care Activities related to HTN, Anxiety, Depression, and Chronic Pain :  . Patient is unable to independently self-manage chronic health conditions  Please see past updates related to this goal by clicking on the "Past Updates" button in the selected goal      . COMPLETED: RNCM: I have not gotten the Covid 19 shot       Current Barriers: Completed Goal- the patient has had both COVID vaccines . Knowledge Deficits related to COVID-19 and impact on patient self health management  Clinical Goal(s):  [name] Over the next 30 days, patient will verbalize basic understanding of COVID-19 impact on individual health and self health management as evidenced by verbalization of basic understanding of COVID-19 as a viral disease, measures to prevent exposure, signs and symptoms,  when to contact provider  Interventions: . Provided education to patient re: COVID19 wait list and with permission from the patient added her to the [email or web address] . Discussed plans with patient for ongoing care management follow up and provided patient with direct contact information for care management team . Provided patient and/or caregiver with COVID19 information about Vaccination places in [facility] . Provided patient with COVID19 educational materials related to Vaccination and COVID19 information by MyChart system  Patient Self Care Activities:  . Patient verbalizes understanding of plan to receive a call when she can make an appointment to get the COVID19 vaccination . Attends all scheduled provider appointments . Calls provider office for new concerns or questions  Initial goal documentation                   . COMPLETED: RNCM: I need help with getting my medication       Current Barriers: completed goal. The patient has her medications and is doing well . Knowledge Deficits related to obtaining refill for xanax . Chronic Disease Management support and education needs related to depression and anxiety  Nurse Case Manager Clinical Goal(s):  [name] Over the next 90 days, patient will verbalize understanding of plan for ,managing depression and anxiety effectively  . Over the next 90 days, patient will work with CCM team and pcp to address needs related to Depression and anxiety . Over the next 90 days, patient will demonstrate a decrease in anxiety  exacerbations as evidenced by ability to effectively cope with life altering changes of not being in the home that she and her husband shared for over 50 years . Over the next 90 days, patient will attend all scheduled medical appointments: appointment on Monday, 10-16-2019  . Over the next 90 days, patient will verbalize basic understanding of chronic disease process and self health management plan as evidenced by coping mechanisms to help her manage her anxiety and depression . Over the next 60 days, patient will work with CM team pharmacist to reconcile medication and questions concerns about COVID19 vaccine . Over the next 60 days, patient will work with CM clinical social worker to help with depression and anxiety   Interventions:  . Evaluation of current treatment plan related to depression and anxiety  and  patient's adherence to plan as established by provider. . Discussed plans with patient for ongoing care management follow up and provided patient with direct contact information for care management team . Provided patient with depression and anxiety  educational materials related to coping skills  . Reviewed scheduled/upcoming provider appointments including:  . Social Work referral for assessment of depression and anxiety  . Pharmacy referral for medication review and questions related to COVID19 . Secure message sent to pcp letting the pcp know the patient was out of xanax. A script will be sent electronically to the pharmacy the patient uses.  Patient Self Care Activities:  . Patient verbalizes understanding of plan to work with CCM team to assist in needs related to depression and anxiety and other concerns . Attends all scheduled provider appointments . Calls provider office for new concerns or questions . Unable to independently manage depression and anxiety due to the death of her nephew recently and moving in with her daughter.  The patient is tearful about having to leave her home that she and her husband shared for over 50 years.  Initial goal documentation     . RNCM: Mom is having a  really hard time managing her pain and discomfort       CARE PLAN ENTRY (see longtitudinal plan of care for additional care plan information)  Current Barriers:  [name] Knowledge Deficits related to how to effectively manage pain as evidence by unresolved pain in patient with chronic pain syndrome, depression and anxiety . Unresolved grief, depression and anxiety as evidence of losing her spouse and having to move in with her daughter, recent death of her last living sibling on 10/29/2019  Nurse Case Manager Clinical Goal(s):  [name] Over the next 60 days, patient will verbalize understanding of plan for managing pain effectively, having decreased episodes of depression and anxiety . Over the next 60 days,  patient will work with [facility] and pcp to address needs related to pain relief measures and dealing with her depression and anxiety  . Over the next 60 days, patient will demonstrate a decrease in pain exacerbations as evidenced by controlled pain . Over the next 60 days, patient will attend all scheduled medical appointments: Front office staff to make an appointment for the patient to follow up with pcp  Interventions:  . Evaluation of current treatment plan related to pain control and patient's adherence to plan as established by provider. . Provided education to patient re: if pain is unresolved and worse to go to the emergency room to be evaluated and treated . Reviewed medications with patient and discussed compliance- the patient verbalized the Lyrica is doing a much better job at controlling her pain. She was going to the pain clinic but she says they were not helping her. She consulted with the pcp and has decided to let the pcp manage her pain and discomfort. She is happy with this decision.  . Evaluation of home health services.  The patient states the home health services has not started yet but she is hopeful that soon they will. She believes this will help her get stronger and able to move better.  . Provided patient with pain educational materials related to non-pharmacological ways to manage pain and discomfort-completed . Reviewed scheduled/upcoming provider appointments including: saw the pcp on 12-15-2019, will follow up per the patient in July. Denies any needs at this time.  [name] Pharmacy consult for support and recommendations- completed.  The pharmacist is available for assistance as needed  . Social worker support for effective management of grief and depression.  The patient is currently working with the LCSW   Patient Self Care Activities:  . Patient verbalizes understanding of plan to work with the interdisciplinary team to help patient manage her pain, depression and anxiety more  effectively . Self administers medications as prescribed . Attends all scheduled provider appointments . Calls provider office for new concerns or questions . Unable to independently manage pain, depression and anxiety related to chronic conditions . Unable to perform IADLs independently  Please see past updates related to this goal by clicking on the "Past Updates" button in the selected goal         Patient verbalizes understanding of instructions provided today.   The care management team will reach out to the patient again over the next 60 days.   Alto [place] RN, MSN, CCM Community Care Coordinator [place]  [facility] Mobile: [phone]

## 2020-01-03 ENCOUNTER — Telehealth: Payer: Medicare HMO | Admitting: Pain Medicine

## 2020-01-03 ENCOUNTER — Ambulatory Visit: Payer: Self-pay | Admitting: *Deleted

## 2020-01-03 NOTE — Telephone Encounter (Signed)
Call dropped due to connection issues; will call pt back.

## 2020-01-03 NOTE — Telephone Encounter (Signed)
Per inititial encounter, "Pt is calling and need clarification on lyrica dose"; contacted pt, and gave instructions per visit 12/03/14/21 ,"Take 1 capsule (75 mg total) by mouth 2 (two) times daily"; she verbalized understanding; the pt sees Roosvelt Maser, [facility]; will route to office for notification.    Reason for Disposition . Caller has medication question only, adult not sick, and triager answers question  Answer Assessment - Initial Assessment Questions 1.   NAME of MEDICATION: "What medicine are you calling about?"     LYRICA 2.   QUESTION: "What is your question?"    Clarification on how to take medication 3.   PRESCRIBING HCP: "Who prescribed it?" Reason: if prescribed by specialist, call should be referred to that group.     Roosvelt Maser 4. SYMPTOMS: "Do you have any symptoms?"   n/a 5. SEVERITY: If symptoms are present, ask "Are they mild, moderate or severe?"      6.  PREGNANCY:  "Is there any chance that you are pregnant?" "When was your last menstrual period?"  Protocols used: MEDICATION QUESTION CALL-A-AH

## 2020-01-03 NOTE — Telephone Encounter (Signed)
Attempted to contact pt; message states voicemail has not been set up yet.

## 2020-01-03 NOTE — Telephone Encounter (Signed)
Routing to provider, just an FYI

## 2020-01-04 ENCOUNTER — Ambulatory Visit (INDEPENDENT_AMBULATORY_CARE_PROVIDER_SITE_OTHER): Payer: Medicare HMO | Admitting: General Practice

## 2020-01-04 DIAGNOSIS — F419 Anxiety disorder, unspecified: Secondary | ICD-10-CM

## 2020-01-04 DIAGNOSIS — G894 Chronic pain syndrome: Secondary | ICD-10-CM

## 2020-01-04 DIAGNOSIS — F3342 Major depressive disorder, recurrent, in full remission: Secondary | ICD-10-CM

## 2020-01-04 DIAGNOSIS — R296 Repeated falls: Secondary | ICD-10-CM

## 2020-01-04 DIAGNOSIS — N3941 Urge incontinence: Secondary | ICD-10-CM

## 2020-01-04 DIAGNOSIS — R531 Weakness: Secondary | ICD-10-CM

## 2020-01-04 DIAGNOSIS — Z7409 Other reduced mobility: Secondary | ICD-10-CM

## 2020-01-04 DIAGNOSIS — I10 Essential (primary) hypertension: Secondary | ICD-10-CM

## 2020-01-04 NOTE — Patient Instructions (Signed)
Visit Information  Goals Addressed            This Visit's Progress   . RNCM- Pt's daughter: "Mom is not being truthful about what is going on." (pt-stated)       CARE PLAN ENTRY (see longitudinal plan of care for additional care plan information)  Current Barriers:  [name] Knowledge Deficits related to fall precautions . Decreased adherence to prescribed treatment for fall prevention . Care Coordination needs related to caregiver strain and frequent falls in a patient with multiple falls/depression/anxiety/ chronic pain (disease states) . Lacks caregiver support.  . Corporate treasurer.  . Non-adherence to prescribed medication regimen  Clinical Goal(s):  [name] Over the next 120 days, patient will demonstrate improved adherence to prescribed treatment plan for decreasing falls as evidenced by patient reporting and review of EMR . Over the next 120 days, patient will verbalize using fall risk reduction strategies discussed . Over the next 120 days, patient will not experience additional falls . Over the next 120 days, patient will work with [facility], CCM team and pcp to address needs related to frequent falls and recommendations for the patient to remain safe in the home . Over the next 120 days, patient will demonstrate a decrease in fall exacerbations as evidenced by no new falls  . Over the next 120 days, patient will demonstrate improved adherence to prescribed treatment plan for medication regimen and falls prevention as evidenced bycompliance in medication administration and no new falls . Over the next 120 days, patient will work with CM team pharmacist to reconcile medications, education and support, review of the patient taking CBD gummies the daughter is giving the patient to help promote rest at night . Over the next 120 days, patient will work with CM clinical social worker to help with caregiver strain and help with anxiety and depression in the patient with multiple chronic conditions.    Interventions:  . Provided written and verbal education re: Potential causes of falls and Fall prevention strategies . Reviewed medications and discussed potential side effects of medications such as dizziness and frequent urination.  The patient's daughter is giving her 75mg  of lyrica BID instead of TID. She has also started a regimen of giving the patietn CBD gummies that she purchases from "[employer]" to help the patient with rest at night.  . Assessed for s/s of orthostatic hypotension.  The patients daughter states she does have dizziness and she cautions the patient all the time to be careful. The patient has had falls x 3 in the last 30 days and EMS has had to come out to the home and get the patient up as the daughter is not able to do so. The patient called the office yesterday but did not offer this information to the pcp or the fact that she is having several incontinence episodes a day, but the patient does not see that is a problem.  . Assessed for falls since last encounter. Last fall was 5/4 and EMS had to come out to the home. The daughter is so overwhelmed that she could not breath and had to call 911 on 5/5 to be seen at the hospital. . Assessed patients knowledge of fall risk prevention secondary to previously provided education. The daughter is aware of the severity of falls and is concerned but states that the patient does not see this is a problem. The daughter is seeking help to help her mother.  Home health has not come out to work  with the patient per the daughter.  . Assessed working status of life alert bracelet and patient adherence . Provided patient information for fall alert systems . Advised patient to call the pcp for any new falls . Provided education to patient re: talking to the pcp and the team about falls and information provided today to see how the patient can be helped . Social Work referral for caregiver strain, depression and anxiety- support for the  family to make decisions on the best way to meet the patients needs . Pharmacy referral for review of medications, help with questions and concerns and review of the CBD gummies the daughter is giving the patient at night. She gets these from [employer].   Patient Self Care Activities:  . Utilize walker (assistive device) appropriately with all ambulation . De-clutter walkways . Change positions slowly . Wear secure fitting shoes at all times with ambulation . Utilize home lighting for dim lit areas . Have self and pet awareness at all times  Plan: . CCM RN CM will follow up in 30 to 60 days   Initial goal documentation     . RNCM: I am having a hard time adjusting to moving in with my daughter       Current Barriers:  . Chronic Disease Management support, education, and care coordination needs related to HTN, Anxiety, Depression, and Chronic Pain . Grief and mourning over the loss of her husband and having to move in with her daughter November 01, 2022 is her husbands death anniversary). The patients last living sibling passed away on 2019-11-05.   Clinical Goal(s) related to HTN, Anxiety, Depression, and Chronic Pain :  Over the next 120 days, patient will:  . Work with the care management team to address educational, disease management, and care coordination needs  . Begin or continue self health monitoring activities as directed today Measure and record blood pressure 4 times per week . Call provider office for new or worsened signs and symptoms Blood pressure findings outside established parameters and New or worsened symptom related to Depression, Anxiety or Chronic Pain . Call care management team with questions or concerns . Verbalize basic understanding of patient centered plan of care established today  Interventions related to HTN, Anxiety, Depression, and Chronic Pain :  . Evaluation of current treatment plans and patient's adherence to plan as established by provider.  The patient  is happy with her new pcp. She has decided that Merrie Roof, PA-C is doing a better job at managing her pain that the pain clinic. The patient decided she was going to let the pcp manage her pain and not go back to the pain clinic. She feels much better than she did. She is thankful for the support and help she has received from the pcp and staff.  . Assessed patient understanding of disease states. Per the daughter the patient is having incontinence several times a day and is having to wear depends. The daughter has not support from her siblings and she does not know how to care for her mother effectively. The patient has had falls x 3  in 30 days with EMS having to come and get the patient out of the floor. The daughter says she has not had PT. She states that she needs help or recommendations on how to best help the patient. Discussed placement with the daughter and the daughter states this is not an option. Is willing to talk to pcp and CCM team to help  with ideas and suggestions.  . Assessed patient's education and care coordination needs.  The patient denies any new concerns at this time. The patient states she is enjoying "porch sitting" since the weather has gotten nicer. She has not started working with PT in her home yet but feels this will be a great benefit for her. She is happy that she finally feels better.  . Provided disease specific education to patient.  The patient reports a good appetite. The patient denies any unintentional weight loss. The patient verbalized she is able to manage well and is adjusting well with living with her daughter and family.  Steele Sizer with appropriate clinical care team members regarding patient needs.  The patient continues to work with the CCM team and knows when to reach out for questions or concerns.  . Advised the patient and the patient daughter to take medications as prescribed.  Patient Self Care Activities related to HTN, Anxiety, Depression, and  Chronic Pain :  . Patient is unable to independently self-manage chronic health conditions  Please see past updates related to this goal by clicking on the "Past Updates" button in the selected goal         Patient verbalizes understanding of instructions provided today.   The care management team will reach out to the patient again over the next 30 to 60 days.   Alto Denver RN, MSN, CCM Community Care Coordinator [place]  [facility] Mobile: [phone]

## 2020-01-04 NOTE — Chronic Care Management (AMB) (Signed)
Chronic Care Management   Follow Up Note   01/04/2020 Name: Cynthia Jones MRN: [id number] DOB: August 30, 1941  Referred by: [name], PA-C Reason for referral : Chronic Care Management (Follow up: Incoming call from the daughter- follow up from Fall with EMS having to coming )   Cynthia Jones is a 79 y.o. year old female who is a primary care patient of [name], [place]. The CCM team was consulted for assistance with chronic disease management and care coordination needs.    Review of patient status, including review of consultants reports, relevant laboratory and other test results, and collaboration with appropriate care team members and the patient's provider was performed as part of comprehensive patient evaluation and provision of chronic care management services.    SDOH (Social Determinants of Health) assessments performed: Yes- in home care See Care Plan activities for detailed interventions related to [facility])     Outpatient Encounter Medications as of 01/04/2020  Medication Sig  . Acetaminophen (TYLENOL 8 HOUR PO) Take by mouth as needed.  . ALPRAZolam (XANAX) 0.25 MG tablet Take 1 tablet (0.25 mg total) by mouth 2 (two) times daily as needed. for anxiety  . ARIPiprazole (ABILIFY) 5 MG tablet Take 1 tablet (5 mg total) by mouth daily.  . carvedilol (COREG) 6.25 MG tablet Take 1 tablet (6.25 mg total) by mouth 2 (two) times daily with a meal.  . cyclobenzaprine (FLEXERIL) 5 MG tablet Take 0.5 tablets (2.5 mg total) by mouth 3 (three) times daily as needed for muscle spasms.  . diclofenac sodium (VOLTAREN) 1 % GEL Apply 2 g topically 4 (four) times daily.  . DULoxetine (CYMBALTA) 60 MG capsule TAKE 1 CAPSULE BY MOUTH EVERY DAY  . esomeprazole (NEXIUM) 40 MG capsule Take 40 mg by mouth daily at 12 noon.  [name] levothyroxine (SYNTHROID) 50 MCG tablet Take 1 tablet (50 mcg total) by mouth daily.  [name] losartan (COZAAR) 100 MG tablet TAKE 1 TABLET BY MOUTH EVERY  DAY  . nystatin (MYCOSTATIN/NYSTOP) powder APPLY TO AFFECTED AREA 3 TIMES A DAY  . pregabalin (LYRICA) 75 MG capsule Take 1 capsule (75 mg total) by mouth 2 (two) times daily.  [name] UNABLE TO FIND Take by mouth as needed. [place] HBP  . Vitamin D, Ergocalciferol, (DRISDOL) 1.25 MG (50000 UNIT) CAPS capsule Take 1 capsule (50,000 Units total) by mouth every 7 (seven) days.   No facility-administered encounter medications on file as of 01/04/2020.     Objective:   Goals Addressed            This Visit's Progress   . RNCM- Pt's daughter: "Mom is not being truthful about what is going on." (pt-stated)       CARE PLAN ENTRY (see longitudinal plan of care for additional care plan information)  Current Barriers:  [name] Knowledge Deficits related to fall precautions . Decreased adherence to prescribed treatment for fall prevention . Care Coordination needs related to caregiver strain and frequent falls in a patient with multiple falls/depression/anxiety/ chronic pain (disease states) . Lacks caregiver support.  . Corporate treasurer.  . Non-adherence to prescribed medication regimen  Clinical Goal(s):  [name] Over the next 120 days, patient will demonstrate improved adherence to prescribed treatment plan for decreasing falls as evidenced by patient reporting and review of EMR . Over the next 120 days, patient will verbalize using fall risk reduction strategies discussed . Over the next 120 days, patient will not experience additional falls . Over the next 120  days, patient will work with [facility] [place] team and pcp to address needs related to frequent falls and recommendations for the patient to remain safe in the home . Over the next 120 days, patient will demonstrate a decrease in fall exacerbations as evidenced by no new falls  . Over the next 120 days, patient will demonstrate improved adherence to prescribed treatment plan for medication regimen and falls prevention as evidenced bycompliance in  medication administration and no new falls . Over the next 120 days, patient will work with CM team pharmacist to reconcile medications, education and support, review of the patient taking CBD gummies the daughter is giving the patient to help promote rest at night . Over the next 120 days, patient will work with CM clinical social worker to help with caregiver strain and help with anxiety and depression in the patient with multiple chronic conditions.   Interventions:  . Provided written and verbal education re: Potential causes of falls and Fall prevention strategies . Reviewed medications and discussed potential side effects of medications such as dizziness and frequent urination.  The patient's daughter is giving her 75mg  of lyrica BID instead of TID. She has also started a regimen of giving the patietn CBD gummies that she purchases from "[employer]" to help the patient with rest at night.  . Assessed for s/s of orthostatic hypotension.  The patients daughter states she does have dizziness and she cautions the patient all the time to be careful. The patient has had falls x 3 in the last 30 days and EMS has had to come out to the home and get the patient up as the daughter is not able to do so. The patient called the office yesterday but did not offer this information to the pcp or the fact that she is having several incontinence episodes a day, but the patient does not see that is a problem.  . Assessed for falls since last encounter. Last fall was 5/4 and EMS had to come out to the home. The daughter is so overwhelmed that she could not breath and had to call 911 on 5/5 to be seen at the hospital. . Assessed patients knowledge of fall risk prevention secondary to previously provided education. The daughter is aware of the severity of falls and is concerned but states that the patient does not see this is a problem. The daughter is seeking help to help her mother.  Home health has not come out to  work with the patient per the daughter.  . Assessed working status of life alert bracelet and patient adherence . Provided patient information for fall alert systems . Advised patient to call the pcp for any new falls . Provided education to patient re: talking to the pcp and the team about falls and information provided today to see how the patient can be helped . Social Work referral for caregiver strain, depression and anxiety- support for the family to make decisions on the best way to meet the patients needs . Pharmacy referral for review of medications, help with questions and concerns and review of the CBD gummies the daughter is giving the patient at night. She gets these from [employer].   Patient Self Care Activities:  . Utilize walker (assistive device) appropriately with all ambulation . De-clutter walkways . Change positions slowly . Wear secure fitting shoes at all times with ambulation . Utilize home lighting for dim lit areas . Have self and pet awareness at all times  Plan: . CCM RN CM will follow up in 30 to 60 days   Initial goal documentation     . RNCM: I am having a hard time adjusting to moving in with my daughter       Current Barriers:  . Chronic Disease Management support, education, and care coordination needs related to HTN, Anxiety, Depression, and Chronic Pain . Grief and mourning over the loss of her husband and having to move in with her daughter 2022-11-02 is her husbands death anniversary). The patients last living sibling passed away on Nov 06, 2019.   Clinical Goal(s) related to HTN, Anxiety, Depression, and Chronic Pain :  Over the next 120 days, patient will:  . Work with the care management team to address educational, disease management, and care coordination needs  . Begin or continue self health monitoring activities as directed today Measure and record blood pressure 4 times per week . Call provider office for new or worsened signs and symptoms  Blood pressure findings outside established parameters and New or worsened symptom related to Depression, Anxiety or Chronic Pain . Call care management team with questions or concerns . Verbalize basic understanding of patient centered plan of care established today  Interventions related to HTN, Anxiety, Depression, and Chronic Pain :  . Evaluation of current treatment plans and patient's adherence to plan as established by provider.  The patient is happy with her new pcp. She has decided that Roosvelt Maser, PA-C is doing a better job at managing her pain that the pain clinic. The patient decided she was going to let the pcp manage her pain and not go back to the pain clinic. She feels much better than she did. She is thankful for the support and help she has received from the pcp and staff.  . Assessed patient understanding of disease states. Per the daughter the patient is having incontinence several times a day and is having to wear depends. The daughter has not support from her siblings and she does not know how to care for her mother effectively. The patient has had falls x 3  in 30 days with EMS having to come and get the patient out of the floor. The daughter says she has not had PT. She states that she needs help or recommendations on how to best help the patient. Discussed placement with the daughter and the daughter states this is not an option. Is willing to talk to pcp and CCM team to help with ideas and suggestions.  . Assessed patient's education and care coordination needs.  The patient denies any new concerns at this time. The patient states she is enjoying "porch sitting" since the weather has gotten nicer. She has not started working with PT in her home yet but feels this will be a great benefit for her. She is happy that she finally feels better.  . Provided disease specific education to patient.  The patient reports a good appetite. The patient denies any unintentional weight loss. The  patient verbalized she is able to manage well and is adjusting well with living with her daughter and family.  Steele Sizer with appropriate clinical care team members regarding patient needs.  The patient continues to work with the CCM team and knows when to reach out for questions or concerns.  . Advised the patient and the patient daughter to take medications as prescribed.  Patient Self Care Activities related to HTN, Anxiety, Depression, and Chronic Pain :  . Patient is unable  to independently self-manage chronic health conditions  Please see past updates related to this goal by clicking on the "Past Updates" button in the selected goal          Plan:   The care management team will reach out to the patient again over the next 30 to 60 days.    Alto Denver RN, MSN, CCM Community Care Coordinator [place]  [facility] Mobile: [phone]

## 2020-01-05 ENCOUNTER — Ambulatory Visit: Payer: Self-pay | Admitting: General Practice

## 2020-01-05 DIAGNOSIS — F419 Anxiety disorder, unspecified: Secondary | ICD-10-CM

## 2020-01-05 DIAGNOSIS — M797 Fibromyalgia: Secondary | ICD-10-CM

## 2020-01-05 DIAGNOSIS — G894 Chronic pain syndrome: Secondary | ICD-10-CM

## 2020-01-05 DIAGNOSIS — R531 Weakness: Secondary | ICD-10-CM

## 2020-01-05 DIAGNOSIS — I10 Essential (primary) hypertension: Secondary | ICD-10-CM

## 2020-01-05 DIAGNOSIS — F3342 Major depressive disorder, recurrent, in full remission: Secondary | ICD-10-CM

## 2020-01-05 DIAGNOSIS — Z7409 Other reduced mobility: Secondary | ICD-10-CM

## 2020-01-05 NOTE — Patient Instructions (Signed)
Visit Information ° °Goals Addressed   °None °  ° ° °Patient verbalizes understanding of instructions provided today.  ° °The care management team will reach out to the patient again over the next 30 to 60 days.  ° °Pam Tate RN, MSN, CCM °Community Care Coordinator °[place]   [facility] °[facility] °Mobile: [phone] ° ° °

## 2020-01-05 NOTE — Chronic Care Management (AMB) (Signed)
  Care Management   Note  01/05/2020 Name: Cynthia Jones MRN: [id number] DOB: [birth year]  A call was made today to the "[facility]" referral line. Spoke to the coordinator. This is a service that offers in home care free of charge. The coordinator will reach out to the patient and the patients daughter to see if this is a good fit for the patient.  The coordinator will provide feedback to the [facility] as the progress of the referral.    The care management team will reach out to the patient again over the next 30 to 60 days.   Alto Denver RN, MSN, CCM Community Care Coordinator [place]  [facility] Mobile: [phone]

## 2020-01-08 ENCOUNTER — Ambulatory Visit
Admission: RE | Admit: 2020-01-08 | Discharge: 2020-01-08 | Disposition: A | Discharge status: Home / Self Care | Payer: Medicare HMO | Source: Ambulatory Visit | Attending: Family Medicine | Admitting: Family Medicine

## 2020-01-08 DIAGNOSIS — Z1231 Encounter for screening mammogram for malignant neoplasm of breast: Secondary | ICD-10-CM | POA: Diagnosis not present

## 2020-01-11 ENCOUNTER — Ambulatory Visit: Payer: Self-pay | Admitting: General Practice

## 2020-01-11 DIAGNOSIS — M8949 Other hypertrophic osteoarthropathy, multiple sites: Secondary | ICD-10-CM | POA: Diagnosis not present

## 2020-01-11 DIAGNOSIS — M159 Polyosteoarthritis, unspecified: Secondary | ICD-10-CM

## 2020-01-11 DIAGNOSIS — I10 Essential (primary) hypertension: Secondary | ICD-10-CM

## 2020-01-11 DIAGNOSIS — M797 Fibromyalgia: Secondary | ICD-10-CM

## 2020-01-11 DIAGNOSIS — F419 Anxiety disorder, unspecified: Secondary | ICD-10-CM

## 2020-01-11 DIAGNOSIS — G894 Chronic pain syndrome: Secondary | ICD-10-CM

## 2020-01-11 DIAGNOSIS — F3342 Major depressive disorder, recurrent, in full remission: Secondary | ICD-10-CM

## 2020-01-11 NOTE — Patient Instructions (Signed)
Visit Information  Goals Addressed            This Visit's Progress   . RNCM- Pt's daughter: "Mom is not being truthful about what is going on." (pt-stated)       CARE PLAN ENTRY (see longitudinal plan of care for additional care plan information)  Current Barriers:  [name] Knowledge Deficits related to fall precautions . Decreased adherence to prescribed treatment for fall prevention . Care Coordination needs related to caregiver strain and frequent falls in a patient with multiple falls/depression/anxiety/ chronic pain (disease states) . Lacks caregiver support.  . Corporate treasurer.  . Non-adherence to prescribed medication regimen  Clinical Goal(s):  [name] Over the next 120 days, patient will demonstrate improved adherence to prescribed treatment plan for decreasing falls as evidenced by patient reporting and review of EMR . Over the next 120 days, patient will verbalize using fall risk reduction strategies discussed . Over the next 120 days, patient will not experience additional falls . Over the next 120 days, patient will work with [facility], CCM team and pcp to address needs related to frequent falls and recommendations for the patient to remain safe in the home . Over the next 120 days, patient will demonstrate a decrease in fall exacerbations as evidenced by no new falls  . Over the next 120 days, patient will demonstrate improved adherence to prescribed treatment plan for medication regimen and falls prevention as evidenced bycompliance in medication administration and no new falls . Over the next 120 days, patient will work with CM team pharmacist to reconcile medications, education and support, review of the patient taking CBD gummies the daughter is giving the patient to help promote rest at night . Over the next 120 days, patient will work with CM clinical social worker to help with caregiver strain and help with anxiety and depression in the patient with multiple chronic conditions.    Interventions:  . Provided written and verbal education re: Potential causes of falls and Fall prevention strategies . Reviewed medications and discussed potential side effects of medications such as dizziness and frequent urination.  The patient's daughter is giving her 75mg  of lyrica BID instead of TID. She has also started a regimen of giving the patietn CBD gummies that she purchases from "[employer]" to help the patient with rest at night.  . Assessed for s/s of orthostatic hypotension.  The patients daughter states she does have dizziness and she cautions the patient all the time to be careful. The patient has had falls x 3 in the last 30 days and EMS has had to come out to the home and get the patient up as the daughter is not able to do so. The patient called the office yesterday but did not offer this information to the pcp or the fact that she is having several incontinence episodes a day, but the patient does not see that is a problem.  . Assessed for falls since last encounter. Last fall was 5/4 and EMS had to come out to the home. The daughter is so overwhelmed that she could not breath and had to call 911 on 5/5 to be seen at the hospital. . Assessed patients knowledge of fall risk prevention secondary to previously provided education. The daughter is aware of the severity of falls and is concerned but states that the patient does not see this is a problem. The daughter is seeking help to help her mother.  Home health has not come out to work  with the patient per the daughter.  . Assessed new concern on 01-10-2020.  The daughter reached out to the [facility] and said she had talked to two people from the right at home program. [place] right at home program is not available. Also she would have to pay out of pocket and this is not possible. The daughter is wanting help in the home. She says that "Katie" with pallative says the patient needs  PT/OT but they don't offer those services. They have not heard from PT/OT per the daughter. A referral for the care guides has been placed for help with resources in [place].  . Assessed working status of life alert bracelet and patient adherence . Provided patient information for fall alert systems . Advised patient to call the pcp for any new falls . Provided education to patient re: talking to the pcp and the team about falls and information provided today to see how the patient can be helped . Social Work referral for caregiver strain, depression and anxiety- support for the family to make decisions on the best way to meet the patients needs . Pharmacy referral for review of medications, help with questions and concerns and review of the CBD gummies the daughter is giving the patient at night. She gets these from [employer].   Patient Self Care Activities:  . Utilize walker (assistive device) appropriately with all ambulation . De-clutter walkways . Change positions slowly . Wear secure fitting shoes at all times with ambulation . Utilize home lighting for dim lit areas . Have self and pet awareness at all times  Plan: . CCM RN CM will follow up in 30 to 60 days   Please see past updates related to this goal by clicking on the "Past Updates" button in the selected goal         Patient verbalizes understanding of instructions provided today.   The care management team will reach out to the patient again over the next 30 to 60 days.   [name] RN, MSN, [facility] Mobile: [phone]

## 2020-01-11 NOTE — Chronic Care Management (AMB) (Signed)
Chronic Care Management   Follow Up Note   01/11/2020 Name: Cynthia Jones MRN: [id number] DOB: 01-27-1941  Referred by: Volney American, PA-C Reason for referral : Chronic Care Management (Daughter reaching out to the [facility] for help in the home due to weakness and falls x 3 in the last month in patient with chronic disease processes and care coordination needs)   Cynthia Jones is a 79 y.o. year old female who is a primary care patient of Volney American, [place]. The CCM team was consulted for assistance with chronic disease management and care coordination needs.    Review of patient status, including review of consultants reports, relevant laboratory and other test results, and collaboration with appropriate care team members and the patient's provider was performed as part of comprehensive patient evaluation and provision of chronic care management services.    SDOH (Social Determinants of Health) assessments performed: Yes- resources need for help in the home for the patient See Care Plan activities for detailed interventions related to [facility])     Outpatient Encounter Medications as of 01/11/2020  Medication Sig  . Acetaminophen (TYLENOL 8 HOUR PO) Take by mouth as needed.  . ALPRAZolam (XANAX) 0.25 MG tablet Take 1 tablet (0.25 mg total) by mouth 2 (two) times daily as needed. for anxiety  . ARIPiprazole (ABILIFY) 5 MG tablet Take 1 tablet (5 mg total) by mouth daily.  . carvedilol (COREG) 6.25 MG tablet Take 1 tablet (6.25 mg total) by mouth 2 (two) times daily with a meal.  . cyclobenzaprine (FLEXERIL) 5 MG tablet Take 0.5 tablets (2.5 mg total) by mouth 3 (three) times daily as needed for muscle spasms.  . diclofenac sodium (VOLTAREN) 1 % GEL Apply 2 g topically 4 (four) times daily.  . DULoxetine (CYMBALTA) 60 MG capsule TAKE 1 CAPSULE BY MOUTH EVERY DAY  . esomeprazole (NEXIUM) 40 MG capsule Take 40 mg by mouth daily at 12 noon.  [name] levothyroxine (SYNTHROID)  50 MCG tablet Take 1 tablet (50 mcg total) by mouth daily.  [name] losartan (COZAAR) 100 MG tablet TAKE 1 TABLET BY MOUTH EVERY DAY  . nystatin (MYCOSTATIN/NYSTOP) powder APPLY TO AFFECTED AREA 3 TIMES A DAY  . pregabalin (LYRICA) 75 MG capsule Take 1 capsule (75 mg total) by mouth 2 (two) times daily.  [name] UNABLE TO FIND Take by mouth as needed. Coriciden HBP  . Vitamin D, Ergocalciferol, (DRISDOL) 1.25 MG (50000 UNIT) CAPS capsule Take 1 capsule (50,000 Units total) by mouth every 7 (seven) days.   No facility-administered encounter medications on file as of 01/11/2020.     Objective:   Goals Addressed            This Visit's Progress   . RNCM- Pt's daughter: "Mom is not being truthful about what is going on." (pt-stated)       CARE PLAN ENTRY (see longitudinal plan of care for additional care plan information)  Current Barriers:  [name] Knowledge Deficits related to fall precautions . Decreased adherence to prescribed treatment for fall prevention . Care Coordination needs related to caregiver strain and frequent falls in a patient with multiple falls/depression/anxiety/ chronic pain (disease states) . Lacks caregiver support.  . Film/video editor.  . Non-adherence to prescribed medication regimen  Clinical Goal(s):  [name] Over the next 120 days, patient will demonstrate improved adherence to prescribed treatment plan for decreasing falls as evidenced by patient reporting and review of EMR . Over the next 120 days, patient will verbalize using  fall risk reduction strategies discussed . Over the next 120 days, patient will not experience additional falls . Over the next 120 days, patient will work with [facility], CCM team and pcp to address needs related to frequent falls and recommendations for the patient to remain safe in the home . Over the next 120 days, patient will demonstrate a decrease in fall exacerbations as evidenced by no new falls  . Over the next 120 days, patient will demonstrate  improved adherence to prescribed treatment plan for medication regimen and falls prevention as evidenced bycompliance in medication administration and no new falls . Over the next 120 days, patient will work with CM team pharmacist to reconcile medications, education and support, review of the patient taking CBD gummies the daughter is giving the patient to help promote rest at night . Over the next 120 days, patient will work with CM clinical social worker to help with caregiver strain and help with anxiety and depression in the patient with multiple chronic conditions.   Interventions:  . Provided written and verbal education re: Potential causes of falls and Fall prevention strategies . Reviewed medications and discussed potential side effects of medications such as dizziness and frequent urination.  The patient's daughter is giving her 75mg  of lyrica BID instead of TID. She has also started a regimen of giving the patietn CBD gummies that she purchases from "[employer]" to help the patient with rest at night.  . Assessed for s/s of orthostatic hypotension.  The patients daughter states she does have dizziness and she cautions the patient all the time to be careful. The patient has had falls x 3 in the last 30 days and EMS has had to come out to the home and get the patient up as the daughter is not able to do so. The patient called the office yesterday but did not offer this information to the pcp or the fact that she is having several incontinence episodes a day, but the patient does not see that is a problem.  . Assessed for falls since last encounter. Last fall was 5/4 and EMS had to come out to the home. The daughter is so overwhelmed that she could not breath and had to call 911 on 5/5 to be seen at the hospital. . Assessed patients knowledge of fall risk prevention secondary to previously provided education. The daughter is aware of the severity of falls and is concerned but states that the  patient does not see this is a problem. The daughter is seeking help to help her mother.  Home health has not come out to work with the patient per the daughter.  . Assessed new concern on 01-10-2020.  The daughter reached out to the [facility] and said she had talked to two people from the right at home program. [place], the right at home program is not available. Also she would have to pay out of pocket and this is not possible. The daughter is wanting help in the home. She says that "Katie" with pallative says the patient needs PT/OT but they don't offer those services. They have not heard from PT/OT per the daughter. A referral for the care guides has been placed for help with resources in [place].  . Assessed working status of life alert bracelet and patient adherence . Provided patient information for fall alert systems . Advised patient to call the pcp for any new falls . Provided education  to patient re: talking to the pcp and the team about falls and information provided today to see how the patient can be helped . Social Work referral for caregiver strain, depression and anxiety- support for the family to make decisions on the best way to meet the patients needs . Pharmacy referral for review of medications, help with questions and concerns and review of the CBD gummies the daughter is giving the patient at night. She gets these from [employer].   Patient Self Care Activities:  . Utilize walker (assistive device) appropriately with all ambulation . De-clutter walkways . Change positions slowly . Wear secure fitting shoes at all times with ambulation . Utilize home lighting for dim lit areas . Have self and pet awareness at all times  Plan: . CCM RN CM will follow up in 30 to 60 days   Please see past updates related to this goal by clicking on the "Past Updates" button in the selected goal          Plan:   The care management  team will reach out to the patient again over the next 30 to 60 days.    Alto Denver RN, MSN, CCM Community Care Coordinator [place]  [facility] Mobile: [phone]

## 2020-01-12 ENCOUNTER — Other Ambulatory Visit: Payer: Self-pay

## 2020-01-12 ENCOUNTER — Other Ambulatory Visit: Payer: Medicare HMO | Admitting: Primary Care

## 2020-01-12 DIAGNOSIS — Z515 Encounter for palliative care: Secondary | ICD-10-CM | POA: Diagnosis not present

## 2020-01-12 NOTE — Progress Notes (Signed)
[employer] Palliative Care Consult Note Telephone: [phone]  Fax: [phone]   TELEHEALTH VISIT STATEMENT Due to the COVID-19 crisis, this visit was done via telemedicine from my office. It was initiated and consented to by this patient and/or family.  PATIENT NAME: Cynthia Jones [place] [address] [id number] (home)  DOB: 1941-04-22 MRN: [id number]  PRIMARY CARE PROVIDER:    Nyra Jones,  [place] [address] [phone]  REFERRING PROVIDER:   Nyra Jones [address] [place],  [place] [address] [phone]  RESPONSIBLE PARTY:   Extended Emergency Contact Information Primary Emergency Contact: Cynthia Jones Mobile Phone: [phone] Relation: Daughter Secondary Emergency Contact: Cynthia Jones,Cynthia Jones Address: [address]          [place], [place] [address] Johnnette Litter of [place] Phone: [phone] Relation: Son   ASSESSMENT AND RECOMMENDATIONS:   1. Advance Care Planning/Goals of Care: Goals include to maximize quality of life and symptom management. MOST on file, no changes. Goals of care discussed and care planning done.  2. Symptom Management:   Caregiver strain/ burden: Patient has declined in function, falling 3 x/ 30 days and also needs more assistance with adls.   Has medicaid and I'd request a DMA-3051 to be sent by the PCP to begin [facility] services. POA given a list of agencies servicing her county and area.   The PCP may generate from site and Fax:   [email or web address] Education provided to Cynthia Jones, daughter and POA, RE how application is made for services, then intake will reach out to them. Once approved, she can choose provider. She voices understanding and appreciation in navigating the process.  Mobility: Patient mobility continues to worsen as evidenced by falls and increasing needs. She would benefit from home health PT, OT  and HHA. Cynthia Jones in [place] also services the patient resident area. I called to confirm availability for therapy services.  Please fax referral for PT, OT and HHA to [phone].   3. Family /Caregiver/Community Supports: Lives with daughter and family in [place]. See above RE caregiver support needs.  4. Cognitive / Functional decline: A and O x 2-3, forgetful. Fallen x 3 in 30 days, unable to ambulate (I) safely.   5. Follow up Palliative Care Visit: Palliative care will continue to follow for goals of care clarification and symptom management. Return 4-6 weeks or prn.  I spent 25 minutes providing this consultation,  from 1500 to 1525. More than 50% of the time in this consultation was spent coordinating communication.   HISTORY OF PRESENT ILLNESS:  Cynthia Jones is a 79 y.o. year old female with multiple medical problems including r/o cva, depression, DM, HTN, obesity, OA, RA, fibromyalgia. Palliative Care was asked to follow this patient by consultation request of Cynthia Jones,* to help address advance care planning and goals of care. This is a follow up visit.  CODE STATUS: DNR, Limited MOST, use of abx and iv, limited feeding tube  PPS: 30% HOSPICE ELIGIBILITY/DIAGNOSIS: TBD  PAST MEDICAL HISTORY:  Past Medical History:  Diagnosis Date  . Acute anxiety 03/06/2015  . Allergy   . Anxiety   . Degenerative joint disease of knee   . Depression   . Fibromyalgia   . Obesity   . Osteopenia   . RAD (reactive airway disease)     SOCIAL HX:  Social History   Tobacco Use  . Smoking status: Never Smoker  .  Smokeless tobacco: Never Used  Substance Use Topics  . Alcohol use: No    Alcohol/week: 0.0 standard drinks    ALLERGIES:  Allergies  Allergen Reactions  . Nsaids Other (See Comments)    GI upset, hx of H pylori and gastric ulcer  . Erythromycin Nausea Only     PERTINENT MEDICATIONS:  Outpatient Encounter Medications as of 01/12/2020    Medication Sig  . Acetaminophen (TYLENOL 8 HOUR PO) Take by mouth as needed.  . ALPRAZolam (XANAX) 0.25 MG tablet Take 1 tablet (0.25 mg total) by mouth 2 (two) times daily as needed. for anxiety  . ARIPiprazole (ABILIFY) 5 MG tablet Take 1 tablet (5 mg total) by mouth daily.  . carvedilol (COREG) 6.25 MG tablet Take 1 tablet (6.25 mg total) by mouth 2 (two) times daily with a meal.  . cyclobenzaprine (FLEXERIL) 5 MG tablet Take 0.5 tablets (2.5 mg total) by mouth 3 (three) times daily as needed for muscle spasms.  . diclofenac sodium (VOLTAREN) 1 % GEL Apply 2 g topically 4 (four) times daily.  . DULoxetine (CYMBALTA) 60 MG capsule TAKE 1 CAPSULE BY MOUTH EVERY DAY  . esomeprazole (NEXIUM) 40 MG capsule Take 40 mg by mouth daily at 12 noon.  [name] levothyroxine (SYNTHROID) 50 MCG tablet Take 1 tablet (50 mcg total) by mouth daily.  [name] losartan (COZAAR) 100 MG tablet TAKE 1 TABLET BY MOUTH EVERY DAY  . nystatin (MYCOSTATIN/NYSTOP) powder APPLY TO AFFECTED AREA 3 TIMES A DAY  . pregabalin (LYRICA) 75 MG capsule Take 1 capsule (75 mg total) by mouth 2 (two) times daily.  [name] UNABLE TO FIND Take by mouth as needed. Coriciden HBP  . Vitamin D, Ergocalciferol, (DRISDOL) 1.25 MG (50000 UNIT) CAPS capsule Take 1 capsule (50,000 Units total) by mouth every 7 (seven) days.   No facility-administered encounter medications on file as of 01/12/2020.    PHYSICAL EXAM / ROS:   Deferred  Eliezer Lofts, NP [facility]

## 2020-01-15 ENCOUNTER — Other Ambulatory Visit: Payer: Medicare HMO | Admitting: Primary Care

## 2020-01-17 ENCOUNTER — Other Ambulatory Visit: Payer: Self-pay | Admitting: Family Medicine

## 2020-01-17 DIAGNOSIS — R296 Repeated falls: Secondary | ICD-10-CM

## 2020-01-17 DIAGNOSIS — Z7409 Other reduced mobility: Secondary | ICD-10-CM

## 2020-01-17 DIAGNOSIS — R531 Weakness: Secondary | ICD-10-CM

## 2020-01-19 ENCOUNTER — Telehealth: Payer: Self-pay | Admitting: Family Medicine

## 2020-01-19 ENCOUNTER — Telehealth: Payer: Self-pay | Admitting: Primary Care

## 2020-01-19 NOTE — Telephone Encounter (Signed)
Patient's daughter would like to speak with clinical staff to discuss if patient is able to stop lyrica and flexeril and start taking Oxycodone again as she states she has some left at home. Patient's daughter would also like to note that patient has shown interest in restarting pain management with Dr. Laban Emperor and inquired if they should call his office directly to schedule or if a referral was needed.

## 2020-01-19 NOTE — Telephone Encounter (Signed)
Routing to provider  

## 2020-01-19 NOTE — Telephone Encounter (Signed)
Tc from daughter RE services for home health and PCS. They have also contacted [facility]. Please have first available  home health to admit.

## 2020-01-19 NOTE — Telephone Encounter (Signed)
I received DMA 3051 form In error from PCP office. I have faxed this on to [facility] at fax [phone].  I will notify office again if for some reason the forward is not acceptable.   I have made a second request for [facility] home [facility] referral pending it being in her provider network. PCP office will follow up with this request. Cynthia Jones stated they could not serve her area, and [facility] said they did cover her vicinity in [place], [place].

## 2020-01-19 NOTE — Telephone Encounter (Signed)
Cynthia Jones with [facility] calling  regarding a referral for PT. Olegario Messier found a place [facility] in [place]. The PT lives with daughter, the PT needs PT, OT, and a home health aide. Dx weakness, frequent falls, and declining mobility. Olegario Messier did not check to see if the place is in the [employer]

## 2020-01-19 NOTE — Telephone Encounter (Signed)
Cynthia Jones did not have [facility] home [facility] phone number just fax number [phone]

## 2020-01-22 NOTE — Telephone Encounter (Signed)
[facility] notified.

## 2020-01-22 NOTE — Telephone Encounter (Signed)
Called pt and advised of Rachel's message since daughter is not on DPR. Virtual scheduled for tomorrow 5/25

## 2020-01-22 NOTE — Telephone Encounter (Signed)
This is a huge deviation from what we've been talking about the last few months - needs appt

## 2020-01-22 NOTE — Telephone Encounter (Signed)
This order was already placed on 5/19 to [facility] health

## 2020-01-23 ENCOUNTER — Telehealth (INDEPENDENT_AMBULATORY_CARE_PROVIDER_SITE_OTHER): Payer: Medicare HMO | Admitting: Family Medicine

## 2020-01-23 ENCOUNTER — Encounter: Payer: Self-pay | Admitting: Family Medicine

## 2020-01-23 VITALS — BP 168/81 | HR 76 | Temp 98.4°F

## 2020-01-23 DIAGNOSIS — R296 Repeated falls: Secondary | ICD-10-CM

## 2020-01-23 DIAGNOSIS — M1712 Unilateral primary osteoarthritis, left knee: Secondary | ICD-10-CM

## 2020-01-23 DIAGNOSIS — M797 Fibromyalgia: Secondary | ICD-10-CM

## 2020-01-23 NOTE — Telephone Encounter (Signed)
Morrie Sheldon called in from [facility] stating they have received referral for patient, however, they are still needing to have last face to face office notes. Please advise and fax to [phone], attention: Morrie Sheldon.

## 2020-01-23 NOTE — Progress Notes (Signed)
BP (!) 168/81   Pulse 76   Temp 98.4 F (36.9 C) (Tympanic)    Subjective:    Patient ID: Cynthia Jones, female    DOB: [birth year], 79 y.o.   MRN: [id number]  HPI: Cynthia Jones is a 79 y.o. female  Chief Complaint  Patient presents with  . Knee Pain    left knee pain medication concerns    . This visit was completed via MyChart due to the restrictions of the COVID-19 pandemic. All issues as above were discussed and addressed. Physical exam was done as above through visual confirmation on MyChart. If it was felt that the patient should be evaluated in the office, they were directed there. The patient verbally consented to this visit. . Location of the patient: home . Location of the provider: work . Those involved with this call:  . Provider: Merrie Roof, PA-C . CMA: Lesle Chris, [place] . Front Desk/Registration: Jill Side  . Time spent on call: 15 minutes with patient face to face via video conference. More than 50% of this time was spent in counseling and coordination of care. 5 minutes total spent in review of patient's record and preparation of their chart. I verified patient identity using two factors (patient name and date of birth). Patient consents verbally to being seen via telemedicine visit today.   Presenting today with daughter who is primary caregiver to discuss worsening left knee pain and mobility issues. Awaiting getting home health PT and OT back on board as this was very helpful previously. She has been falling more lately due to favoring her left knee due to pain. Overall still finding good benefit with her fibromyalgia pain and overall stiffness with lyrica and flexeril regimen over the oxycodone regimen she was previously on, but states the left knee pain is severe and debilitating. Wanting to return to pain mgmt for consideration of restarting knee injections as this was helpful in the past vs going back on oxycodone.   Relevant past medical,  surgical, family and social history reviewed and updated as indicated. Interim medical history since our last visit reviewed. Allergies and medications reviewed and updated.  Review of Systems  Per HPI unless specifically indicated above     Objective:    BP (!) 168/81   Pulse 76   Temp 98.4 F (36.9 C) (Tympanic)   Wt Readings from Last 3 Encounters:  11/15/19 222 lb (100.7 kg)  10/27/19 223 lb (101.2 kg)  10/16/19 221 lb (100.2 kg)    Physical Exam Vitals and nursing note reviewed.  Constitutional:      General: She is not in acute distress.    Appearance: Normal appearance.  HENT:     Head: Atraumatic.     Right Ear: External ear normal.     Left Ear: External ear normal.     Nose: Nose normal. No congestion.     Mouth/Throat:     Mouth: Mucous membranes are moist.     Pharynx: Oropharynx is clear. No posterior oropharyngeal erythema.  Eyes:     Extraocular Movements: Extraocular movements intact.     Conjunctiva/sclera: Conjunctivae normal.  Cardiovascular:     Comments: Unable to assess via virtual visit Pulmonary:     Effort: Pulmonary effort is normal. No respiratory distress.  Musculoskeletal:        General: Normal range of motion.     Cervical back: Normal range of motion.  Skin:    General: Skin is dry.  Findings: No erythema.  Neurological:     Mental Status: She is alert and oriented to person, place, and time.  Psychiatric:        Mood and Affect: Mood normal.        Thought Content: Thought content normal.        Judgment: Judgment normal.     Results for orders placed or performed in visit on 10/27/19  CBC With Differential/Platelet  Result Value Ref Range   WBC 8.4 3.4 - 10.8 x10E3/uL   RBC 4.17 3.77 - 5.28 x10E6/uL   Hemoglobin 13.2 11.1 - 15.9 g/dL   Hematocrit 20.2 54.2 - 46.6 %   MCV 93 79 - 97 fL   MCH 31.7 26.6 - 33.0 pg   MCHC 33.9 31.5 - 35.7 g/dL   RDW 70.6 23.7 - 62.8 %   Platelets 280 150 - 450 x10E3/uL   Neutrophils 57  Not Estab. %   Lymphs 30 Not Estab. %   MID 13 Not Estab. %   Neutrophils Absolute 4.8 1.4 - 7.0 x10E3/uL   Lymphocytes Absolute 2.5 0.7 - 3.1 x10E3/uL   MID (Absolute) 1.1 0.1 - 1.6 X10E3/uL  UA/M w/rflx Culture, Routine   Specimen: Urine   URINE  Result Value Ref Range   Specific Gravity, UA 1.015 1.005 - 1.030   pH, UA 5.0 5.0 - 7.5   Color, UA Yellow Yellow   Appearance Ur Clear Clear   Leukocytes,UA Negative Negative   Protein,UA Negative Negative/Trace   Glucose, UA Negative Negative   Ketones, UA Negative Negative   RBC, UA Negative Negative   Bilirubin, UA Negative Negative   Urobilinogen, Ur 0.2 0.2 - 1.0 mg/dL   Nitrite, UA Negative Negative      Assessment & Plan:   Problem List Items Addressed This Visit      Musculoskeletal and Integument   Osteoarthritis of knee (Left) - Primary (Chronic)    Severe, debilitating pain limiting her mobility and ability to perform ADLs. Do not recommend going back on oxycodone as it did not seem to help significantly with her pain and caused constipation, drowsiness. Agree with going back for considering of knee injections through Pain Mgmt, pt and daughter will try to set up appt with previous pain specialist for this. Continue lyrica, flexeril, diclofenac gel in meantime        Other   Fibromyalgia (Chronic)    Still finding excellent benefit with lyrica and flexeril over oxycodone regimen, and with less side effects. Continue current regimen       Other Visit Diagnoses    Frequent falls       Awaiting restarting PT and OT services for strengthing and balance training       Follow up plan: Return for as scheduled.

## 2020-01-24 NOTE — Telephone Encounter (Signed)
Reached out to Mead, she will send over the information requested.

## 2020-01-25 DIAGNOSIS — M797 Fibromyalgia: Secondary | ICD-10-CM | POA: Diagnosis not present

## 2020-01-25 DIAGNOSIS — E1140 Type 2 diabetes mellitus with diabetic neuropathy, unspecified: Secondary | ICD-10-CM | POA: Diagnosis not present

## 2020-01-25 DIAGNOSIS — M5481 Occipital neuralgia: Secondary | ICD-10-CM | POA: Diagnosis not present

## 2020-01-25 DIAGNOSIS — M1712 Unilateral primary osteoarthritis, left knee: Secondary | ICD-10-CM | POA: Diagnosis not present

## 2020-01-25 DIAGNOSIS — G8929 Other chronic pain: Secondary | ICD-10-CM | POA: Diagnosis not present

## 2020-01-25 DIAGNOSIS — F419 Anxiety disorder, unspecified: Secondary | ICD-10-CM | POA: Diagnosis not present

## 2020-01-25 DIAGNOSIS — F329 Major depressive disorder, single episode, unspecified: Secondary | ICD-10-CM | POA: Diagnosis not present

## 2020-01-25 DIAGNOSIS — M8949 Other hypertrophic osteoarthropathy, multiple sites: Secondary | ICD-10-CM | POA: Diagnosis not present

## 2020-01-25 DIAGNOSIS — I10 Essential (primary) hypertension: Secondary | ICD-10-CM | POA: Diagnosis not present

## 2020-01-29 NOTE — Assessment & Plan Note (Signed)
Still finding excellent benefit with lyrica and flexeril over oxycodone regimen, and with less side effects. Continue current regimen

## 2020-01-29 NOTE — Assessment & Plan Note (Signed)
Severe, debilitating pain limiting her mobility and ability to perform ADLs. Do not recommend going back on oxycodone as it did not seem to help significantly with her pain and caused constipation, drowsiness. Agree with going back for considering of knee injections through Pain Mgmt, pt and daughter will try to set up appt with previous pain specialist for this. Continue lyrica, flexeril, diclofenac gel in meantime

## 2020-01-30 ENCOUNTER — Telehealth: Payer: Self-pay | Admitting: Family Medicine

## 2020-01-30 DIAGNOSIS — M1712 Unilateral primary osteoarthritis, left knee: Secondary | ICD-10-CM | POA: Diagnosis not present

## 2020-01-30 DIAGNOSIS — G8929 Other chronic pain: Secondary | ICD-10-CM | POA: Diagnosis not present

## 2020-01-30 DIAGNOSIS — F419 Anxiety disorder, unspecified: Secondary | ICD-10-CM | POA: Diagnosis not present

## 2020-01-30 DIAGNOSIS — M797 Fibromyalgia: Secondary | ICD-10-CM | POA: Diagnosis not present

## 2020-01-30 DIAGNOSIS — M5481 Occipital neuralgia: Secondary | ICD-10-CM | POA: Diagnosis not present

## 2020-01-30 DIAGNOSIS — M8949 Other hypertrophic osteoarthropathy, multiple sites: Secondary | ICD-10-CM | POA: Diagnosis not present

## 2020-01-30 DIAGNOSIS — F329 Major depressive disorder, single episode, unspecified: Secondary | ICD-10-CM | POA: Diagnosis not present

## 2020-01-30 DIAGNOSIS — E1140 Type 2 diabetes mellitus with diabetic neuropathy, unspecified: Secondary | ICD-10-CM | POA: Diagnosis not present

## 2020-01-30 DIAGNOSIS — I10 Essential (primary) hypertension: Secondary | ICD-10-CM | POA: Diagnosis not present

## 2020-01-30 NOTE — Telephone Encounter (Signed)
Email to pt  From: Manuela Schwartz [facility])  Sent: Tuesday, January 30, 2020 2:46 PM To: [email or web address] Subject: Secure: [employer] [place]  Good Afternoon Ms. Malen Gauze, Thank you for speaking with me today regarding community resources. Please reach out if you need further assistance.  Scientist, water quality ([email or web address]) [place] ([email or web address])    Manuela Schwartz  Care Guide . Embedded Care Coordination [facility] Management Samara [email or web address]  [phone]

## 2020-01-30 NOTE — Telephone Encounter (Signed)
  Community Resource Referral   KNB 01/30/2020  DOB: [birth year]   AGE: 79 y.o.   GENDER: female   PCP [name], PA-C.   Called pt's daughter regarding Community Resource Referral for [employer] referral and resources to help prevent falls. She said that her mother has had 3 falls in 30 days and they have occupational therapist and Physical therapist coming in to help her build her strength. Working with Kevin Fenton with Palliative care and she has requested referral for PCS through [facility]. Ms. Malen Gauze has already chosen the agency and is on disability herself due to a failed back surgery and it is beyond her physical capabilities. She stated that she will be on the lookout for liberty letter. Requested additional resources for [place] will send her via email, Closing referral pending any other needs of patient. KNB    Manuela Schwartz  Care Guide . Embedded Care Coordination [facility] Management Samara [email or web address]  [phone]

## 2020-02-05 DIAGNOSIS — M5481 Occipital neuralgia: Secondary | ICD-10-CM | POA: Diagnosis not present

## 2020-02-05 DIAGNOSIS — I10 Essential (primary) hypertension: Secondary | ICD-10-CM | POA: Diagnosis not present

## 2020-02-05 DIAGNOSIS — G8929 Other chronic pain: Secondary | ICD-10-CM | POA: Diagnosis not present

## 2020-02-05 DIAGNOSIS — M797 Fibromyalgia: Secondary | ICD-10-CM | POA: Diagnosis not present

## 2020-02-05 DIAGNOSIS — F419 Anxiety disorder, unspecified: Secondary | ICD-10-CM | POA: Diagnosis not present

## 2020-02-05 DIAGNOSIS — M8949 Other hypertrophic osteoarthropathy, multiple sites: Secondary | ICD-10-CM | POA: Diagnosis not present

## 2020-02-05 DIAGNOSIS — M1712 Unilateral primary osteoarthritis, left knee: Secondary | ICD-10-CM | POA: Diagnosis not present

## 2020-02-05 DIAGNOSIS — E1140 Type 2 diabetes mellitus with diabetic neuropathy, unspecified: Secondary | ICD-10-CM | POA: Diagnosis not present

## 2020-02-05 DIAGNOSIS — F329 Major depressive disorder, single episode, unspecified: Secondary | ICD-10-CM | POA: Diagnosis not present

## 2020-02-07 DIAGNOSIS — M5481 Occipital neuralgia: Secondary | ICD-10-CM | POA: Diagnosis not present

## 2020-02-07 DIAGNOSIS — F329 Major depressive disorder, single episode, unspecified: Secondary | ICD-10-CM | POA: Diagnosis not present

## 2020-02-07 DIAGNOSIS — M797 Fibromyalgia: Secondary | ICD-10-CM | POA: Diagnosis not present

## 2020-02-07 DIAGNOSIS — E1140 Type 2 diabetes mellitus with diabetic neuropathy, unspecified: Secondary | ICD-10-CM | POA: Diagnosis not present

## 2020-02-07 DIAGNOSIS — M1712 Unilateral primary osteoarthritis, left knee: Secondary | ICD-10-CM | POA: Diagnosis not present

## 2020-02-07 DIAGNOSIS — I10 Essential (primary) hypertension: Secondary | ICD-10-CM | POA: Diagnosis not present

## 2020-02-07 DIAGNOSIS — G8929 Other chronic pain: Secondary | ICD-10-CM | POA: Diagnosis not present

## 2020-02-07 DIAGNOSIS — M8949 Other hypertrophic osteoarthropathy, multiple sites: Secondary | ICD-10-CM | POA: Diagnosis not present

## 2020-02-07 DIAGNOSIS — F419 Anxiety disorder, unspecified: Secondary | ICD-10-CM | POA: Diagnosis not present

## 2020-02-09 NOTE — Telephone Encounter (Signed)
Attempted to call patient for pre virtual appointment questions.  Voicemail has not been set up.  Unable to leave message.

## 2020-02-11 NOTE — Progress Notes (Signed)
Unsuccessful attempt to contact patient for Virtual Visit (Pain Management Telehealth)   Patient provided contact information:  [phone] (home); [phone] (mobile); (Preferred) [phone] [email or web address]   Pre-screening:  Our staff was unsuccessful in contacting Cynthia Jones using the above provided information.   I unsuccessfully attempted to make contact with Cynthia Jones on 02/12/2020 via telephone. I was unable to complete the virtual encounter due to call going directly to voicemail. I was unable to leave a message due to a mailbox that has not been setup.  Pharmacotherapy Assessment  Analgesic: Oxycodone IR 5 mg, 1 tab PO q8 hrs (15 mg/day of oxycodone) (enough to last until 10/07/2019) MME/day:15mg /day.   Follow-up plan:   Reschedule Visit.     Interventional treatment options: Planned, scheduled, and/or pending:   No further increases on opioid analgesics    Under consideration:   Possible right Genicular nerve RFA  Diagnostic left Genicular NB Possibleleft Genicular nerve RFA Possible bilateral lumbar facet RFA Diagnostic left LESI Diagnostic caudal ESI + epidurogram Possible Racz procedure Diagnostic bilateral cervical facet block Possible bilateral cervical facet RFA Diagnostic right versus left CESI Diagnostic bilateral IA shoulder joint injection Diagnostic bilateral suprascapular NB Possible bilateral suprascapular nerve RFA   Therapeutic/palliative (PRN):  Diagnostic right Genicular NB #2(90/90/50)  Diagnosticleft IA knee joint injection #2(100/100/90)  Palliativeleft IA Hyalgan Knee injection S2N1 Diagnostic bilateral lumbar facet block #2    Recent Visits No visits were found meeting these conditions. Showing recent visits within past 90 days and meeting all other requirements Today's Visits Date Type Provider Dept  02/12/20 Telemedicine Delano Metz, MD [facility]  Showing today's visits  and meeting all other requirements Future Appointments No visits were found meeting these conditions. Showing future appointments within next 90 days and meeting all other requirements   Note by: Oswaldo Done, MD Date: 02/12/2020; Time: 3:55 PM

## 2020-02-12 ENCOUNTER — Ambulatory Visit: Payer: Medicare HMO | Attending: Pain Medicine | Admitting: Pain Medicine

## 2020-02-12 ENCOUNTER — Telehealth: Payer: Self-pay

## 2020-02-12 ENCOUNTER — Other Ambulatory Visit: Payer: Self-pay

## 2020-02-12 DIAGNOSIS — G894 Chronic pain syndrome: Secondary | ICD-10-CM

## 2020-02-12 NOTE — Telephone Encounter (Signed)
Attempted to call.  Voicemail not set up

## 2020-02-13 ENCOUNTER — Telehealth: Payer: Self-pay | Admitting: Family Medicine

## 2020-02-13 DIAGNOSIS — F329 Major depressive disorder, single episode, unspecified: Secondary | ICD-10-CM | POA: Diagnosis not present

## 2020-02-13 DIAGNOSIS — M8949 Other hypertrophic osteoarthropathy, multiple sites: Secondary | ICD-10-CM | POA: Diagnosis not present

## 2020-02-13 DIAGNOSIS — M797 Fibromyalgia: Secondary | ICD-10-CM | POA: Diagnosis not present

## 2020-02-13 DIAGNOSIS — I10 Essential (primary) hypertension: Secondary | ICD-10-CM | POA: Diagnosis not present

## 2020-02-13 DIAGNOSIS — F419 Anxiety disorder, unspecified: Secondary | ICD-10-CM | POA: Diagnosis not present

## 2020-02-13 DIAGNOSIS — M5481 Occipital neuralgia: Secondary | ICD-10-CM | POA: Diagnosis not present

## 2020-02-13 DIAGNOSIS — M1712 Unilateral primary osteoarthritis, left knee: Secondary | ICD-10-CM | POA: Diagnosis not present

## 2020-02-13 DIAGNOSIS — E1140 Type 2 diabetes mellitus with diabetic neuropathy, unspecified: Secondary | ICD-10-CM | POA: Diagnosis not present

## 2020-02-13 DIAGNOSIS — G8929 Other chronic pain: Secondary | ICD-10-CM | POA: Diagnosis not present

## 2020-02-13 NOTE — Telephone Encounter (Signed)
Shelly with [facility] calling to request medical social worker and speech therapy consult for patient.

## 2020-02-14 DIAGNOSIS — F329 Major depressive disorder, single episode, unspecified: Secondary | ICD-10-CM | POA: Diagnosis not present

## 2020-02-14 DIAGNOSIS — G8929 Other chronic pain: Secondary | ICD-10-CM | POA: Diagnosis not present

## 2020-02-14 DIAGNOSIS — E1140 Type 2 diabetes mellitus with diabetic neuropathy, unspecified: Secondary | ICD-10-CM | POA: Diagnosis not present

## 2020-02-14 DIAGNOSIS — M1712 Unilateral primary osteoarthritis, left knee: Secondary | ICD-10-CM | POA: Diagnosis not present

## 2020-02-14 DIAGNOSIS — M8949 Other hypertrophic osteoarthropathy, multiple sites: Secondary | ICD-10-CM | POA: Diagnosis not present

## 2020-02-14 DIAGNOSIS — M5481 Occipital neuralgia: Secondary | ICD-10-CM | POA: Diagnosis not present

## 2020-02-14 DIAGNOSIS — M797 Fibromyalgia: Secondary | ICD-10-CM | POA: Diagnosis not present

## 2020-02-14 DIAGNOSIS — I10 Essential (primary) hypertension: Secondary | ICD-10-CM | POA: Diagnosis not present

## 2020-02-14 DIAGNOSIS — F419 Anxiety disorder, unspecified: Secondary | ICD-10-CM | POA: Diagnosis not present

## 2020-02-14 NOTE — Telephone Encounter (Signed)
Already working with our CCM Child psychotherapist, will cc her on this. Can they take a verbal on the Speech Therapy? If not, can place a referral but I know she lives with her daughter now so it would be helpful to know which area they are able to get to for a provider

## 2020-02-15 DIAGNOSIS — E1140 Type 2 diabetes mellitus with diabetic neuropathy, unspecified: Secondary | ICD-10-CM | POA: Diagnosis not present

## 2020-02-15 DIAGNOSIS — M8949 Other hypertrophic osteoarthropathy, multiple sites: Secondary | ICD-10-CM | POA: Diagnosis not present

## 2020-02-15 DIAGNOSIS — F329 Major depressive disorder, single episode, unspecified: Secondary | ICD-10-CM | POA: Diagnosis not present

## 2020-02-15 DIAGNOSIS — M5481 Occipital neuralgia: Secondary | ICD-10-CM | POA: Diagnosis not present

## 2020-02-15 DIAGNOSIS — M1712 Unilateral primary osteoarthritis, left knee: Secondary | ICD-10-CM | POA: Diagnosis not present

## 2020-02-15 DIAGNOSIS — I10 Essential (primary) hypertension: Secondary | ICD-10-CM | POA: Diagnosis not present

## 2020-02-15 DIAGNOSIS — M797 Fibromyalgia: Secondary | ICD-10-CM | POA: Diagnosis not present

## 2020-02-15 DIAGNOSIS — G8929 Other chronic pain: Secondary | ICD-10-CM | POA: Diagnosis not present

## 2020-02-15 DIAGNOSIS — F419 Anxiety disorder, unspecified: Secondary | ICD-10-CM | POA: Diagnosis not present

## 2020-02-15 NOTE — Telephone Encounter (Signed)
FYI: Please forward back to [facility] Clinical  Called and spoke with patient's daughter, her mom is under the care of [facility] and [facility].  [place].   [facility] P: [phone] F: [phone]  Called and left a message for Cynthia Jones to return my call.

## 2020-02-15 NOTE — Telephone Encounter (Signed)
Verbal orders given  

## 2020-02-15 NOTE — Telephone Encounter (Signed)
Shelly returned a call to [employer]. Please call back

## 2020-02-16 ENCOUNTER — Ambulatory Visit (INDEPENDENT_AMBULATORY_CARE_PROVIDER_SITE_OTHER): Payer: Medicare HMO | Admitting: Licensed Clinical Social Worker

## 2020-02-16 DIAGNOSIS — F3342 Major depressive disorder, recurrent, in full remission: Secondary | ICD-10-CM

## 2020-02-16 DIAGNOSIS — M797 Fibromyalgia: Secondary | ICD-10-CM

## 2020-02-16 DIAGNOSIS — G894 Chronic pain syndrome: Secondary | ICD-10-CM

## 2020-02-16 DIAGNOSIS — F4321 Adjustment disorder with depressed mood: Secondary | ICD-10-CM

## 2020-02-16 DIAGNOSIS — I10 Essential (primary) hypertension: Secondary | ICD-10-CM

## 2020-02-16 NOTE — Chronic Care Management (AMB) (Signed)
Chronic Care Management    Clinical Social Work Follow Up Note  02/16/2020 Name: Cynthia Jones MRN: [id number] DOB: 1941-08-14  Cynthia Jones is a 79 y.o. year old female who is a primary care patient of Volney American, [place]. The CCM team was consulted for assistance with [employer] .   Review of patient status, including review of consultants reports, other relevant assessments, and collaboration with appropriate care team members and the patient's provider was performed as part of comprehensive patient evaluation and provision of chronic care management services.    SDOH (Social Determinants of Health) assessments performed: Yes    Outpatient Encounter Medications as of 02/16/2020  Medication Sig  . Acetaminophen (TYLENOL 8 HOUR PO) Take by mouth as needed.  . ALPRAZolam (XANAX) 0.25 MG tablet Take 1 tablet (0.25 mg total) by mouth 2 (two) times daily as needed. for anxiety  . ARIPiprazole (ABILIFY) 5 MG tablet Take 1 tablet (5 mg total) by mouth daily.  [name] b complex vitamins tablet Take 1 tablet by mouth daily.  . carvedilol (COREG) 6.25 MG tablet Take 1 tablet (6.25 mg total) by mouth 2 (two) times daily with a meal.  . cyclobenzaprine (FLEXERIL) 5 MG tablet Take 0.5 tablets (2.5 mg total) by mouth 3 (three) times daily as needed for muscle spasms.  . diclofenac sodium (VOLTAREN) 1 % GEL Apply 2 g topically 4 (four) times daily.  . DULoxetine (CYMBALTA) 60 MG capsule TAKE 1 CAPSULE BY MOUTH EVERY DAY  . esomeprazole (NEXIUM) 40 MG capsule Take 40 mg by mouth daily at 12 noon.  [name] levothyroxine (SYNTHROID) 50 MCG tablet Take 1 tablet (50 mcg total) by mouth daily.  [name] losartan (COZAAR) 100 MG tablet TAKE 1 TABLET BY MOUTH EVERY DAY  . nystatin (MYCOSTATIN/NYSTOP) powder APPLY TO AFFECTED AREA 3 TIMES A DAY  . pregabalin (LYRICA) 75 MG capsule Take 1 capsule (75 mg total) by mouth 2 (two) times daily.  [name] UNABLE TO FIND Take by mouth as needed. Coriciden HBP  .  UNABLE TO FIND Take by mouth daily. CBD gummies  . Vitamin D, Ergocalciferol, (DRISDOL) 1.25 MG (50000 UNIT) CAPS capsule Take 1 capsule (50,000 Units total) by mouth every 7 (seven) days.   No facility-administered encounter medications on file as of 02/16/2020.     Goals Addressed    .  SW: "Mom's anxiety and depression are getting worse since her brother passed." (pt-stated)        Current Barriers:  . Chronic Mental Health needs related to depression, anxiety and most recently- grief . Limited social support . ADL IADL limitations . Mental Health Concerns  . Social Isolation . Memory Deficits . Suicidal Ideation/Homicidal Ideation: No  Clinical Social Work Goal(s):  [name] Over the next 120 days, patient will work with SW  bi-monthly  by telephone or in person to reduce or manage symptoms related to depression, grief and anxiety . Over the next 120 days, patient will demonstrate improved health management independence as evidenced by implementing appropriate self-care and mental health coping tools into her daily routine to combat stressors.  Interventions: . Patient interviewed and appropriate assessments performed: brief mental health assessment . Patient reports that recent medication adjustment by PCP has been helpful to her chronic pain. . Patient declines any further issues with allergies since PCP prescribed her amoxicillin.  . Patient continues to reside with daughter Cynthia Jones who provides ongoing care giving and supervision. Cynthia Jones has made great efforts to accommodate patient and her  safety needs in her home. Daughter reports ongoing home [place] Daughter reports hat they cut off 2 inches of patient's bed so it cold be shorter and to her liking. She reports that they are going to build a walk in shower for patient as well. Cynthia Jones confirms filing patient's pill boxes.  . Patient interviewed and appropriate  assessments performed . Provided mental health counseling with regard to anxiety, depression, grief support  . Provided patient with information about available grief support resources within the area ([facility] and [facility]) email sent with this information to patient's family member. Patient receiving grief therapy through [facility]. Patient worked with counselor Cynthia Jones and completed 3 sessions but discontinued services because family reports that patient was not willing to talk or open up with therapist over the phone.  Family only wants virtual therapy at this time due to mobility issues. . Discussed plans with patient for ongoing care management follow up and provided patient with direct contact information for care management team . Advised patient to stay in close communication with [facility] for grief counseling and palliative care  . Collaborated with primary care provider re: medication concerns . Assisted patient/caregiver with obtaining information about health plan benefits . Provided education and assistance to client regarding Advanced Directives. . Provided education to patient/caregiver regarding level of care options. . Provided education to patient/caregiver about Hospice and/or Palliative Care services . Consulted with MD re: daughter wishes for PCP to make order for [facility] Social Worker.  . Daughter declines any falls from patient within the last 3 weeks.  . Daughter reports having to start therapy as well due to ongoing stress and caregiver burnout.   Patient Self Care Activities:  [name] Motivation for treatment . Strong family or social support  Patient Coping Strengths:  . Supportive Relationships . Family . Hopefulness  Patient Self Care Deficits:  . Lacks social connections . Unable to perform ADLs independently . Unable to perform IADLs independently  Please see past updates related to this goal by clicking on the "Past Updates" button in the selected goal        Follow Up Plan: SW will follow up with patient by phone over the next quarter   Dickie La, BSW, MSW, LCSW [facility] Care Management [place]  [facility] [email or web address] Phone: [phone]

## 2020-02-19 DIAGNOSIS — M1712 Unilateral primary osteoarthritis, left knee: Secondary | ICD-10-CM | POA: Diagnosis not present

## 2020-02-19 DIAGNOSIS — E1140 Type 2 diabetes mellitus with diabetic neuropathy, unspecified: Secondary | ICD-10-CM | POA: Diagnosis not present

## 2020-02-19 DIAGNOSIS — M5481 Occipital neuralgia: Secondary | ICD-10-CM | POA: Diagnosis not present

## 2020-02-19 DIAGNOSIS — M8949 Other hypertrophic osteoarthropathy, multiple sites: Secondary | ICD-10-CM | POA: Diagnosis not present

## 2020-02-19 DIAGNOSIS — M797 Fibromyalgia: Secondary | ICD-10-CM | POA: Diagnosis not present

## 2020-02-19 DIAGNOSIS — I10 Essential (primary) hypertension: Secondary | ICD-10-CM | POA: Diagnosis not present

## 2020-02-19 DIAGNOSIS — F329 Major depressive disorder, single episode, unspecified: Secondary | ICD-10-CM | POA: Diagnosis not present

## 2020-02-19 DIAGNOSIS — G8929 Other chronic pain: Secondary | ICD-10-CM | POA: Diagnosis not present

## 2020-02-19 DIAGNOSIS — F419 Anxiety disorder, unspecified: Secondary | ICD-10-CM | POA: Diagnosis not present

## 2020-02-20 DIAGNOSIS — M8949 Other hypertrophic osteoarthropathy, multiple sites: Secondary | ICD-10-CM | POA: Diagnosis not present

## 2020-02-20 DIAGNOSIS — M1712 Unilateral primary osteoarthritis, left knee: Secondary | ICD-10-CM | POA: Diagnosis not present

## 2020-02-20 DIAGNOSIS — E1140 Type 2 diabetes mellitus with diabetic neuropathy, unspecified: Secondary | ICD-10-CM | POA: Diagnosis not present

## 2020-02-20 DIAGNOSIS — F419 Anxiety disorder, unspecified: Secondary | ICD-10-CM | POA: Diagnosis not present

## 2020-02-20 DIAGNOSIS — M5481 Occipital neuralgia: Secondary | ICD-10-CM | POA: Diagnosis not present

## 2020-02-20 DIAGNOSIS — I10 Essential (primary) hypertension: Secondary | ICD-10-CM | POA: Diagnosis not present

## 2020-02-20 DIAGNOSIS — M797 Fibromyalgia: Secondary | ICD-10-CM | POA: Diagnosis not present

## 2020-02-20 DIAGNOSIS — F329 Major depressive disorder, single episode, unspecified: Secondary | ICD-10-CM | POA: Diagnosis not present

## 2020-02-20 DIAGNOSIS — G8929 Other chronic pain: Secondary | ICD-10-CM | POA: Diagnosis not present

## 2020-02-21 ENCOUNTER — Telehealth: Payer: Self-pay | Admitting: Family Medicine

## 2020-02-21 DIAGNOSIS — I10 Essential (primary) hypertension: Secondary | ICD-10-CM | POA: Diagnosis not present

## 2020-02-21 DIAGNOSIS — F329 Major depressive disorder, single episode, unspecified: Secondary | ICD-10-CM | POA: Diagnosis not present

## 2020-02-21 DIAGNOSIS — M797 Fibromyalgia: Secondary | ICD-10-CM | POA: Diagnosis not present

## 2020-02-21 DIAGNOSIS — M1712 Unilateral primary osteoarthritis, left knee: Secondary | ICD-10-CM | POA: Diagnosis not present

## 2020-02-21 DIAGNOSIS — M5481 Occipital neuralgia: Secondary | ICD-10-CM | POA: Diagnosis not present

## 2020-02-21 DIAGNOSIS — F419 Anxiety disorder, unspecified: Secondary | ICD-10-CM | POA: Diagnosis not present

## 2020-02-21 DIAGNOSIS — E1140 Type 2 diabetes mellitus with diabetic neuropathy, unspecified: Secondary | ICD-10-CM | POA: Diagnosis not present

## 2020-02-21 DIAGNOSIS — G8929 Other chronic pain: Secondary | ICD-10-CM | POA: Diagnosis not present

## 2020-02-21 DIAGNOSIS — M8949 Other hypertrophic osteoarthropathy, multiple sites: Secondary | ICD-10-CM | POA: Diagnosis not present

## 2020-02-21 NOTE — Telephone Encounter (Signed)
Copied from CRM [id number]. Topic: General - Other >> Feb 21, 2020  2:48 PM Cynthia Jones wrote: Reason for CRM: Pt called and is requesting to have a call back from PCP regarding her knee and see if she can have possible injections. Please advise.Unsure to schedule before PCP advises.

## 2020-02-21 NOTE — Telephone Encounter (Signed)
Patient notified of Rachel's message and verbalized understanding.  

## 2020-02-21 NOTE — Telephone Encounter (Signed)
She needs to speak with her Pain Management specialist about this, they were the ones doing them

## 2020-02-22 DIAGNOSIS — M1712 Unilateral primary osteoarthritis, left knee: Secondary | ICD-10-CM | POA: Diagnosis not present

## 2020-02-22 DIAGNOSIS — I10 Essential (primary) hypertension: Secondary | ICD-10-CM | POA: Diagnosis not present

## 2020-02-22 DIAGNOSIS — G8929 Other chronic pain: Secondary | ICD-10-CM | POA: Diagnosis not present

## 2020-02-22 DIAGNOSIS — F419 Anxiety disorder, unspecified: Secondary | ICD-10-CM | POA: Diagnosis not present

## 2020-02-22 DIAGNOSIS — M797 Fibromyalgia: Secondary | ICD-10-CM | POA: Diagnosis not present

## 2020-02-22 DIAGNOSIS — F329 Major depressive disorder, single episode, unspecified: Secondary | ICD-10-CM | POA: Diagnosis not present

## 2020-02-22 DIAGNOSIS — M8949 Other hypertrophic osteoarthropathy, multiple sites: Secondary | ICD-10-CM | POA: Diagnosis not present

## 2020-02-22 DIAGNOSIS — M5481 Occipital neuralgia: Secondary | ICD-10-CM | POA: Diagnosis not present

## 2020-02-22 DIAGNOSIS — E1140 Type 2 diabetes mellitus with diabetic neuropathy, unspecified: Secondary | ICD-10-CM | POA: Diagnosis not present

## 2020-02-23 ENCOUNTER — Encounter: Payer: Self-pay | Admitting: Pain Medicine

## 2020-02-26 ENCOUNTER — Other Ambulatory Visit: Payer: Self-pay

## 2020-02-26 ENCOUNTER — Ambulatory Visit: Payer: Medicare HMO | Attending: Pain Medicine | Admitting: Pain Medicine

## 2020-02-26 ENCOUNTER — Telehealth: Payer: Self-pay | Admitting: Pain Medicine

## 2020-02-26 DIAGNOSIS — G894 Chronic pain syndrome: Secondary | ICD-10-CM | POA: Diagnosis not present

## 2020-02-26 DIAGNOSIS — M25562 Pain in left knee: Secondary | ICD-10-CM | POA: Diagnosis not present

## 2020-02-26 DIAGNOSIS — M1712 Unilateral primary osteoarthritis, left knee: Secondary | ICD-10-CM

## 2020-02-26 DIAGNOSIS — G8929 Other chronic pain: Secondary | ICD-10-CM | POA: Diagnosis not present

## 2020-02-26 NOTE — Telephone Encounter (Signed)
Let pt know that we will call her when we get approval for her knee injections.

## 2020-02-26 NOTE — Patient Instructions (Addendum)
____________________________________________________________________________________________  Preparing for your procedure (without sedation)  Procedure appointments are limited to planned procedures: . No Prescription Refills. . No disability issues will be discussed. . No medication changes will be discussed.  Instructions: . Oral Intake: Do not eat or drink anything for at least 6 hours prior to your procedure. (Exception: Blood Pressure Medication. See below.) . Transportation: Unless otherwise stated by your physician, you may drive yourself after the procedure. . Blood Pressure Medicine: Do not forget to take your blood pressure medicine with a sip of water the morning of the procedure. If your Diastolic (lower reading)is above 100 mmHg, elective cases will be cancelled/rescheduled. . Blood thinners: These will need to be stopped for procedures. Notify our staff if you are taking any blood thinners. Depending on which one you take, there will be specific instructions on how and when to stop it. . Diabetics on insulin: Notify the staff so that you can be scheduled 1st case in the morning. If your diabetes requires high dose insulin, take only  of your normal insulin dose the morning of the procedure and notify the staff that you have done so. . Preventing infections: Shower with an antibacterial soap the morning of your procedure.  . Build-up your immune system: Take 1000 mg of Vitamin C with every meal (3 times a day) the day prior to your procedure. . Antibiotics: Inform the staff if you have a condition or reason that requires you to take antibiotics before dental procedures. . Pregnancy: If you are pregnant, call and cancel the procedure. . Sickness: If you have a cold, fever, or any active infections, call and cancel the procedure. . Arrival: You must be in the facility at least 30 minutes prior to your scheduled procedure. . Children: Do not bring any children with you. . Dress  appropriately: Bring dark clothing that you would not mind if they get stained. . Valuables: Do not bring any jewelry or valuables.  Reasons to call and reschedule or cancel your procedure: (Following these recommendations will minimize the risk of a serious complication.) . Surgeries: Avoid having procedures within 2 weeks of any surgery. (Avoid for 2 weeks before or after any surgery). . Flu Shots: Avoid having procedures within 2 weeks of a flu shots or . (Avoid for 2 weeks before or after immunizations). . Barium: Avoid having a procedure within 7-10 days after having had a radiological study involving the use of radiological contrast. (Myelograms, Barium swallow or enema study). . Heart attacks: Avoid any elective procedures or surgeries for the initial 6 months after a "Myocardial Infarction" (Heart Attack). . Blood thinners: It is imperative that you stop these medications before procedures. Let us know if you if you take any blood thinner.  . Infection: Avoid procedures during or within two weeks of an infection (including chest colds or gastrointestinal problems). Symptoms associated with infections include: Localized redness, fever, chills, night sweats or profuse sweating, burning sensation when voiding, cough, congestion, stuffiness, runny nose, sore throat, diarrhea, nausea, vomiting, cold or Flu symptoms, recent or current infections. It is specially important if the infection is over the area that we intend to treat. . Heart and lung problems: Symptoms that may suggest an active cardiopulmonary problem include: cough, chest pain, breathing difficulties or shortness of breath, dizziness, ankle swelling, uncontrolled high or unusually low blood pressure, and/or palpitations. If you are experiencing any of these symptoms, cancel your procedure and contact your primary care physician for an evaluation.  Remember:  Regular   Business hours are:  Monday to Thursday 8:00 AM to 4:00  PM  Provider's Schedule: Ezrah Dembeck, MD:  Procedure days: Tuesday and Thursday 7:30 AM to 4:00 PM  Bilal Lateef, MD:  Procedure days: Monday and Wednesday 7:30 AM to 4:00 PM ____________________________________________________________________________________________    

## 2020-02-26 NOTE — Progress Notes (Signed)
Patient: Cynthia Jones  Service Category: E/M  Provider: Gaspar Cola, MD  DOB: 06-03-1941  DOS: 02/26/2020  Location: Office  MRN: [id number]  Setting: Ambulatory outpatient  Referring Provider: Volney American,*  Type: Established Patient  Specialty: Interventional Pain Management  PCP: Volney American, PA-C  Location: Remote location  Delivery: TeleHealth     Virtual Encounter - Pain Management PROVIDER NOTE: Information contained herein reflects review and annotations entered in association with encounter. Interpretation of such information and data should be left to medically-trained personnel. Information provided to patient can be located elsewhere in the medical record under "Patient Instructions". Document created using STT-dictation technology, any transcriptional errors that may result from process are unintentional.    Contact & Pharmacy Preferred: [phone] Home: [phone] (home) Mobile: [phone] (mobile) E-mail: [email or web address]  [place] [place] [address] Phone: [phone] Fax: [phone]   Pre-screening  Cynthia Jones offered "in-person" vs "virtual" encounter. She indicated preferring virtual for this encounter.   Reason COVID-19*   Social distancing based on CDC and AMA recommendations.   I contacted Cynthia Jones on 02/26/2020 via telephone.      I clearly identified myself as Gaspar Cola, MD. I verified that I was speaking with the correct person using two identifiers (Name: Cynthia Jones, and date of birth: 1941-01-31).  Consent I sought verbal advanced consent from Cynthia Jones for virtual visit interactions. I informed Cynthia Jones of possible security and privacy concerns, risks, and limitations associated with providing "not-in-person" medical evaluation and management services. I also informed Cynthia Jones of the availability of  "in-person" appointments. Finally, I informed her that there would be a charge for the virtual visit and that she could be  personally, fully or partially, financially responsible for it. Cynthia Jones expressed understanding and agreed to proceed.   Historic Elements   Cynthia Jones is a 79 y.o. year old, female patient evaluated today after her last contact with our practice on 02/12/2020. Cynthia Jones  has a past medical history of Acute anxiety (03/06/2015), Allergy, Anxiety, Degenerative joint disease of knee, Depression, Fibromyalgia, Obesity, Osteopenia, and RAD (reactive airway disease). She also  has a past surgical history that includes Parotidectomy (Left); Appendectomy; Cholecystectomy; Abdominal hysterectomy; Tonsillectomy; Joint replacement (2014); Eye surgery; and Breast cyst aspiration (Right, 04/18/2013). Cynthia Jones has a current medication list which includes the following prescription(s): alprazolam, aripiprazole, b complex vitamins, carvedilol, cyclobenzaprine, diclofenac sodium, duloxetine, esomeprazole, levothyroxine, losartan, nystatin, pregabalin, UNABLE TO FIND, UNABLE TO FIND, and vitamin d (ergocalciferol). She  reports that she has never smoked. She has never used smokeless tobacco. She reports that she does not drink alcohol and does not use drugs. Cynthia Jones is allergic to nsaids and erythromycin.   HPI  Today, she is being contacted for follow-up evaluation.  Today the patient indicates that she is having of a lot of pain in her left knee and would like something done about it.  Review of the patient's imaging shows bicompartmental osteoarthritis of the left knee.  Today I have offered to bring her in for a knee injection and she has indicated that she is very much interested in doing that.  Her last Hyalgan knee injection was on 11/01/2018.  Pharmacotherapy Assessment  Analgesic: Oxycodone IR 5 mg, 1 tab PO q8 hrs (15 mg/day of oxycodone) last filled on 11/10/2019.   She has been taking CBD Gummies. MME/day:'15mg'$ /day.  Monitoring: [place] PMP: PDMP reviewed during this encounter.       Pharmacotherapy: No side-effects or adverse reactions reported. Compliance: No problems identified. Effectiveness: Clinically acceptable. Plan: Refer to "POC".  UDS:  Summary  Date Value Ref Range Status  02/09/2018 FINAL  Final    Comment:    ==================================================================== TOXASSURE SELECT 13 (MW) ==================================================================== Test                             Result       Flag       Units Drug Present and Declared for Prescription Verification   Oxycodone                      1276         EXPECTED   ng/mg creat   Noroxycodone                   2447         EXPECTED   ng/mg creat    Sources of oxycodone include scheduled prescription medications.    Noroxycodone is an expected metabolite of oxycodone. Drug Absent but Declared for Prescription Verification   Alprazolam                     Not Detected UNEXPECTED ng/mg creat ==================================================================== Test                      Result    Flag   Units      Ref Range   Creatinine              109              mg/dL      >=20 ==================================================================== Declared Medications:  The flagging and interpretation on this report are based on the  following declared medications.  Unexpected results may arise from  inaccuracies in the declared medications.  **Note: The testing scope of this panel includes these medications:  Alprazolam (Xanax)  Oxycodone  **Note: The testing scope of this panel does not include following  reported medications:  Carvedilol  Duloxetine  Losartan (Losartan Potassium)  Nystatin  Omeprazole (Nexium)  Polymyxin (PolyTrim)  Trimethoprim (PolyTrim) ==================================================================== For clinical  consultation, please call [phone]. ====================================================================     Laboratory Chemistry Profile   Renal Lab Results  Component Value Date   BUN 13 06/16/2019   CREATININE 0.76 06/16/2019   BCR 17 06/16/2019   GFRAA 88 06/16/2019   GFRNONAA 76 06/16/2019     Hepatic Lab Results  Component Value Date   AST 13 06/16/2019   ALT 12 06/16/2019   ALBUMIN 4.4 06/16/2019   ALKPHOS 57 06/16/2019   LIPASE 41 11/20/2018     Electrolytes Lab Results  Component Value Date   NA 140 06/16/2019   K 4.7 06/16/2019   CL 103 06/16/2019   CALCIUM 10.2 06/16/2019   MG 2.0 08/08/2018     Bone Lab Results  Component Value Date   25OHVITD1 20 (L) 08/08/2018   25OHVITD2 <1.0 08/08/2018   25OHVITD3 20 08/08/2018     Inflammation (CRP: Acute Phase) (ESR: Chronic Phase) Lab Results  Component Value Date   CRP 17 (H) 08/08/2018   ESRSEDRATE 94 (H) 08/08/2018       Note: Above Lab results reviewed.   Imaging  MM 3D SCREEN BREAST BILATERAL CLINICAL DATA:  Screening.  EXAM:  DIGITAL SCREENING BILATERAL MAMMOGRAM WITH TOMO AND CAD  COMPARISON:  Previous exam(s).  ACR Breast Density Category b: There are scattered areas of fibroglandular density.  FINDINGS: There are no findings suspicious for malignancy. Images were processed with CAD.  IMPRESSION: No mammographic evidence of malignancy. A result letter of this screening mammogram will be mailed directly to the patient.  RECOMMENDATION: Screening mammogram in one year. (Code:[id number]-B-01Y)  BI-RADS CATEGORY  1: Negative.  Electronically Signed   By: Everlean Alstrom M.D.   On: 01/09/2020 10:05  Assessment  The primary encounter diagnosis was Chronic pain syndrome. Diagnoses of Chronic knee pain (Left) and Osteoarthritis of knee (Left) were also pertinent to this visit.  Plan of Care  Problem-specific:  No problem-specific Assessment & Plan notes found for this  encounter.  Cynthia Jones has a current medication list which includes the following long-term medication(s): aripiprazole, carvedilol, diclofenac sodium, duloxetine, esomeprazole, levothyroxine, losartan, and pregabalin.  Pharmacotherapy (Medications Ordered): No orders of the defined types were placed in this encounter.  Orders:  Orders Placed This Encounter  Procedures   KNEE INJECTION    Local Anesthetic & Steroid injection.    Standing Status:   Future    Standing Expiration Date:   05/28/2020    Scheduling Instructions:     Side: Left-sided     Sedation: None     Timeframe: ASAP    Order Specific Question:   Where will this procedure be performed?    Answer:   [facility]   Follow-up plan:   Return for Procedure (no sedation): (L) Knee Steroid inj..      Interventional treatment options: Planned, scheduled, and/or pending:   No further increases on opioid analgesics    Under consideration:   Possible right Genicular nerve RFA  Diagnostic left Genicular NB Possibleleft Genicular nerve RFA Possible bilateral lumbar facet RFA Diagnostic left LESI Diagnostic caudal ESI + epidurogram Possible Racz procedure Diagnostic bilateral cervical facet block Possible bilateral cervical facet RFA Diagnostic right versus left CESI Diagnostic bilateral IA shoulder joint injection Diagnostic bilateral suprascapular NB Possible bilateral suprascapular nerve RFA   Therapeutic/palliative (PRN):  Diagnostic right Genicular NB #2(90/90/50)  Diagnosticleft IA knee joint injection #2(100/100/90)  Palliativeleft IA Hyalgan Knee injection S2N1(last completed on 11/01/2018)  Diagnostic bilateral lumbar facet block #2    Recent Visits No visits were found meeting these conditions. Showing recent visits within past 90 days and meeting all other requirements Today's Visits Date Type Provider Dept  02/26/20 Telemedicine Milinda Pointer, MD [facility]  Showing today's visits and meeting all other requirements Future Appointments No visits were found meeting these conditions. Showing future appointments within next 90 days and meeting all other requirements  I discussed the assessment and treatment plan with the patient. The patient was provided an opportunity to ask questions and all were answered. The patient agreed with the plan and demonstrated an understanding of the instructions.  Patient advised to call back or seek an in-person evaluation if the symptoms or condition worsens.  Duration of encounter: 15 minutes.  Note by: Gaspar Cola, MD Date: 02/26/2020; Time: 3:34 PM

## 2020-02-27 DIAGNOSIS — I10 Essential (primary) hypertension: Secondary | ICD-10-CM | POA: Diagnosis not present

## 2020-02-27 DIAGNOSIS — F329 Major depressive disorder, single episode, unspecified: Secondary | ICD-10-CM | POA: Diagnosis not present

## 2020-02-27 DIAGNOSIS — G8929 Other chronic pain: Secondary | ICD-10-CM | POA: Diagnosis not present

## 2020-02-27 DIAGNOSIS — F419 Anxiety disorder, unspecified: Secondary | ICD-10-CM | POA: Diagnosis not present

## 2020-02-27 DIAGNOSIS — M8949 Other hypertrophic osteoarthropathy, multiple sites: Secondary | ICD-10-CM | POA: Diagnosis not present

## 2020-02-27 DIAGNOSIS — M5481 Occipital neuralgia: Secondary | ICD-10-CM | POA: Diagnosis not present

## 2020-02-27 DIAGNOSIS — E1140 Type 2 diabetes mellitus with diabetic neuropathy, unspecified: Secondary | ICD-10-CM | POA: Diagnosis not present

## 2020-02-27 DIAGNOSIS — M1712 Unilateral primary osteoarthritis, left knee: Secondary | ICD-10-CM | POA: Diagnosis not present

## 2020-02-27 DIAGNOSIS — M797 Fibromyalgia: Secondary | ICD-10-CM | POA: Diagnosis not present

## 2020-02-28 ENCOUNTER — Telehealth: Payer: Self-pay

## 2020-02-29 ENCOUNTER — Other Ambulatory Visit: Payer: Self-pay | Admitting: Family Medicine

## 2020-02-29 DIAGNOSIS — F419 Anxiety disorder, unspecified: Secondary | ICD-10-CM | POA: Diagnosis not present

## 2020-02-29 DIAGNOSIS — M797 Fibromyalgia: Secondary | ICD-10-CM | POA: Diagnosis not present

## 2020-02-29 DIAGNOSIS — M1712 Unilateral primary osteoarthritis, left knee: Secondary | ICD-10-CM | POA: Diagnosis not present

## 2020-02-29 DIAGNOSIS — I10 Essential (primary) hypertension: Secondary | ICD-10-CM | POA: Diagnosis not present

## 2020-02-29 DIAGNOSIS — M8949 Other hypertrophic osteoarthropathy, multiple sites: Secondary | ICD-10-CM | POA: Diagnosis not present

## 2020-02-29 DIAGNOSIS — G8929 Other chronic pain: Secondary | ICD-10-CM | POA: Diagnosis not present

## 2020-02-29 DIAGNOSIS — M5481 Occipital neuralgia: Secondary | ICD-10-CM | POA: Diagnosis not present

## 2020-02-29 DIAGNOSIS — F329 Major depressive disorder, single episode, unspecified: Secondary | ICD-10-CM | POA: Diagnosis not present

## 2020-02-29 DIAGNOSIS — E1140 Type 2 diabetes mellitus with diabetic neuropathy, unspecified: Secondary | ICD-10-CM | POA: Diagnosis not present

## 2020-02-29 NOTE — Telephone Encounter (Signed)
Requested medication (s) are due for refill today: Yes  Requested medication (s) are on the active medication list: Yes  Last refill:  12/15/19  Future visit scheduled: No  Notes to clinic:  See request.    Requested Prescriptions  Pending Prescriptions Disp Refills   cyclobenzaprine (FLEXERIL) 5 MG tablet [Pharmacy Med Name: CYCLOBENZAPRINE HCL 5 MG TAB] 45 tablet 1    Sig: TAKE 1/2 TAB BY MOUTH 3 TIMES DAILY AS NEEDED FOR MUSCLE SPASMS      Not Delegated - Analgesics:  Muscle Relaxants Failed - 02/29/2020  8:24 AM      Failed - This refill cannot be delegated      Passed - Valid encounter within last 6 months    Recent Outpatient Visits           1 month ago Osteoarthritis of knee (Left)   [name], [place]   2 months ago Fibromyalgia   [facility] Maser [place], [place]   3 months ago Acute non-recurrent maxillary sinusitis   [facility] [place], [place]   3 months ago Fibromyalgia   [facility] Maser [place], [place]   4 months ago Fibromyalgia   [facility] Maser [place], [place]

## 2020-03-01 ENCOUNTER — Ambulatory Visit (INDEPENDENT_AMBULATORY_CARE_PROVIDER_SITE_OTHER): Payer: Medicare HMO | Admitting: Family Medicine

## 2020-03-01 ENCOUNTER — Telehealth: Payer: Self-pay | Admitting: Family Medicine

## 2020-03-01 ENCOUNTER — Other Ambulatory Visit: Payer: Self-pay

## 2020-03-01 ENCOUNTER — Encounter: Payer: Self-pay | Admitting: Family Medicine

## 2020-03-01 VITALS — BP 139/85 | HR 65 | Temp 98.0°F | Wt 240.0 lb

## 2020-03-01 DIAGNOSIS — F3342 Major depressive disorder, recurrent, in full remission: Secondary | ICD-10-CM

## 2020-03-01 DIAGNOSIS — E039 Hypothyroidism, unspecified: Secondary | ICD-10-CM | POA: Diagnosis not present

## 2020-03-01 DIAGNOSIS — M1712 Unilateral primary osteoarthritis, left knee: Secondary | ICD-10-CM

## 2020-03-01 DIAGNOSIS — M797 Fibromyalgia: Secondary | ICD-10-CM

## 2020-03-01 DIAGNOSIS — E1140 Type 2 diabetes mellitus with diabetic neuropathy, unspecified: Secondary | ICD-10-CM | POA: Diagnosis not present

## 2020-03-01 DIAGNOSIS — F419 Anxiety disorder, unspecified: Secondary | ICD-10-CM

## 2020-03-01 DIAGNOSIS — I10 Essential (primary) hypertension: Secondary | ICD-10-CM | POA: Diagnosis not present

## 2020-03-01 DIAGNOSIS — E7800 Pure hypercholesterolemia, unspecified: Secondary | ICD-10-CM

## 2020-03-01 DIAGNOSIS — Z6841 Body Mass Index (BMI) 40.0 and over, adult: Secondary | ICD-10-CM

## 2020-03-01 NOTE — Telephone Encounter (Signed)
Error

## 2020-03-01 NOTE — Progress Notes (Signed)
BP 139/85   Pulse 65   Temp 98 F (36.7 C) (Oral)   Wt 240 lb (108.9 kg)   SpO2 97%   BMI 46.87 kg/m    Subjective:    Patient ID: Cynthia Jones, female    DOB: 02/24/1941, 79 y.o.   MRN: [id number]  HPI: Cynthia Jones is a 79 y.o. female  Chief Complaint  Patient presents with  . Hypertension  . Osteoarthritis   Presenting today for concern about weight gain since starting Lyrica for her fibromyalgia, chronic pain. This has been quite helpful for these sxs but states she's gained about 20 lb in the last 6 months since starting. Wishes to come off because of this. Still having some overall pain, but majority of pain lies in left knee which she is back in with Pain Management for now and awaiting resuming knee injections. Still on cymbalta, topical pain relievers with very mild relief as well. Used to be on oxycodone through Pain Mgmt but notes this just seemed to take the edge off and make her groggy and constipated. Of note, prior to this past 6 months had also gained about 15 or so lb prior to the lyrica. Pt's mobility has declined significantly the past year and son, who is with her today, states she is exercising with the home health therapists but otherwise mostly sits during the day.   Also overdue for 6 month f/u with labs.   HTN - BPs running in the 120s/80s when checked at home. Taking medication faithfully without side effects. Denies CP, SOB, HAs, dizziness.   DM with neuropathy - not checking home BSs. Diet controlled currently. Not closely watching diet. Denies polyphagia, polyuria, polydipsia, hypoglycemic events.   Hypothyroid - stable on synthroid, asymptomatic  Anxiety and depression - on abilify, cymbalta and prn xanax which overall seems to be helping. Still quite anxious but much improved from earlier this year. Denies frequent panic attacks, mood swings, crying spells at this time.   Depression screen [facility] 2/9 12/08/2019 11/15/2019 10/16/2019  Decreased  Interest 0 0 2  Down, Depressed, Hopeless 1 0 2  PHQ - 2 Score 1 0 4  Altered sleeping - 0 3  Tired, decreased energy - 1 3  Change in appetite - 0 2  Feeling bad or failure about yourself  - 0 1  Trouble concentrating - 0 2  Moving slowly or fidgety/restless - 0 3  Suicidal thoughts - 0 0  PHQ-9 Score - 1 18  Difficult doing work/chores - - Somewhat difficult  Some recent data might be hidden   GAD 7 : Generalized Anxiety Score 11/15/2019 10/16/2019 06/16/2019  Nervous, Anxious, on Edge 0 3 2  Control/stop worrying 1 3 1   Worry too much - different things 1 3 2   Trouble relaxing 0 3 2  Restless 0 3 0  Easily annoyed or irritable 0 3 1  Afraid - awful might happen 0 2 0  Total GAD 7 Score 2 20 8   Anxiety Difficulty - Very difficult Somewhat difficult     Relevant past medical, surgical, family and social history reviewed and updated as indicated. Interim medical history since our last visit reviewed. Allergies and medications reviewed and updated.  Review of Systems  Per HPI unless specifically indicated above     Objective:    BP 139/85   Pulse 65   Temp 98 F (36.7 C) (Oral)   Wt 240 lb (108.9 kg)   SpO2 97%  BMI 46.87 kg/m   Wt Readings from Last 3 Encounters:  03/01/20 240 lb (108.9 kg)  11/15/19 222 lb (100.7 kg)  10/27/19 223 lb (101.2 kg)    Physical Exam Vitals and nursing note reviewed.  Constitutional:      Appearance: Normal appearance. She is obese. She is not ill-appearing.  HENT:     Head: Atraumatic.  Eyes:     Extraocular Movements: Extraocular movements intact.     Conjunctiva/sclera: Conjunctivae normal.  Cardiovascular:     Rate and Rhythm: Normal rate and regular rhythm.     Heart sounds: Normal heart sounds.  Pulmonary:     Effort: Pulmonary effort is normal.     Breath sounds: Normal breath sounds.  Musculoskeletal:     Cervical back: Normal range of motion and neck supple.     Comments: In wheelchair  Skin:    General: Skin is  warm and dry.  Neurological:     General: No focal deficit present.     Mental Status: She is alert. Mental status is at baseline.  Psychiatric:        Mood and Affect: Mood normal.        Thought Content: Thought content normal.        Judgment: Judgment normal.     Results for orders placed or performed in visit on 03/01/20  Comprehensive metabolic panel  Result Value Ref Range   Glucose 106 (H) 65 - 99 mg/dL   BUN 17 8 - 27 mg/dL   Creatinine, Ser 5.40 0.57 - 1.00 mg/dL   GFR calc non Af Amer 63 >59 mL/min/1.73   GFR calc Af Amer 72 >59 mL/min/1.73   BUN/Creatinine Ratio 19 12 - 28   Sodium 140 134 - 144 mmol/L   Potassium 4.4 3.5 - 5.2 mmol/L   Chloride 103 96 - 106 mmol/L   CO2 21 20 - 29 mmol/L   Calcium 9.4 8.7 - 10.3 mg/dL   Total Protein 7.2 6.0 - 8.5 g/dL   Albumin 4.1 3.7 - 4.7 g/dL   Globulin, Total 3.1 1.5 - 4.5 g/dL   Albumin/Globulin Ratio 1.3 1.2 - 2.2   Bilirubin Total 0.2 0.0 - 1.2 mg/dL   Alkaline Phosphatase 51 48 - 121 IU/L   AST 17 0 - 40 IU/L   ALT 15 0 - 32 IU/L  Lipid Panel w/o Chol/HDL Ratio  Result Value Ref Range   Cholesterol, Total 208 (H) 100 - 199 mg/dL   Triglycerides 981 0 - 149 mg/dL   HDL 70 >19 mg/dL   VLDL Cholesterol Cal 19 5 - 40 mg/dL   LDL Chol Calc (NIH) 147 (H) 0 - 99 mg/dL  TSH  Result Value Ref Range   TSH 2.630 0.450 - 4.500 uIU/mL      Assessment & Plan:   Problem List Items Addressed This Visit      Cardiovascular and Mediastinum   Hypertensive disorder - Primary    BPs stable and WNL, continue current regimen      Relevant Orders   CBC with Differential/Platelet   Comprehensive metabolic panel (Completed)   UA/M w/rflx Culture, Routine     Endocrine   Diabetes mellitus (HCC)    Diet controlled, recheck A1C, adjust as needed. Continue working on improving diet and increasing activity      Relevant Orders   HgB A1c   Hypothyroid    Recheck TSH, adjust as needed. Continue current regimen      Relevant  Orders   TSH (Completed)     Musculoskeletal and Integument   Osteoarthritis of knee (Left) (Chronic)    Back in with Pain Mgmt for injections to the knee. Continue following with them for this        Other   Fibromyalgia (Chronic)    Requesting to come off lyrica due to concerns of weight gain. Will taper over course of 1-2 weeks, then monitor over next few weeks. Concerned about her pain control once off, may need to resume as her pain was significant daily all over body prior to getting on the lyrica. Continue to monitor closely once off.       Anxiety    On xanax prn, continue careful rare use in addition to other chronic medications. Currently stable and under good control      Depression    Fairly well controlled, continue current regimen      Obesity    Has gained 20+ lb the last 6-12 months, unclear if medication related or due to more sedentary lifestyle. Will work on increasing activity as tolerated, trial being off the lyrica per patient request, healthy diet reviewed      Hypercholesteremia    Recheck lipids, adjust as needed. Continue working on lifestyle modifications      Relevant Orders   Lipid Panel w/o Chol/HDL Ratio (Completed)       Follow up plan: Return in about 4 weeks (around 03/29/2020) for 1 month pain f/u .

## 2020-03-02 LAB — LIPID PANEL W/O CHOL/HDL RATIO
Cholesterol, Total: 208 mg/dL — ABNORMAL HIGH (ref 100–199)
HDL: 70 mg/dL (ref >39)
LDL Chol Calc (NIH): 119 mg/dL — ABNORMAL HIGH (ref 0–99)
Triglycerides: 108 mg/dL (ref 0–149)
VLDL Cholesterol Cal: 19 mg/dL (ref 5–40)

## 2020-03-02 LAB — COMPREHENSIVE METABOLIC PANEL
ALT: 15 IU/L (ref 0–32)
AST: 17 IU/L (ref 0–40)
Albumin/Globulin Ratio: 1.3 (ref 1.2–2.2)
Albumin: 4.1 g/dL (ref 3.7–4.7)
Alkaline Phosphatase: 51 IU/L (ref 48–121)
BUN/Creatinine Ratio: 19 (ref 12–28)
BUN: 17 mg/dL (ref 8–27)
Bilirubin Total: 0.2 mg/dL (ref 0.0–1.2)
CO2: 21 mmol/L (ref 20–29)
Calcium: 9.4 mg/dL (ref 8.7–10.3)
Chloride: 103 mmol/L (ref 96–106)
Creatinine, Ser: 0.89 mg/dL (ref 0.57–1.00)
GFR calc Af Amer: 72 mL/min/{1.73_m2} (ref >59)
GFR calc non Af Amer: 63 mL/min/{1.73_m2} (ref >59)
Globulin, Total: 3.1 g/dL (ref 1.5–4.5)
Glucose: 106 mg/dL — ABNORMAL HIGH (ref 65–99)
Potassium: 4.4 mmol/L (ref 3.5–5.2)
Sodium: 140 mmol/L (ref 134–144)
Total Protein: 7.2 g/dL (ref 6.0–8.5)

## 2020-03-02 LAB — TSH: TSH: 2.63 u[IU]/mL (ref 0.450–4.500)

## 2020-03-06 NOTE — Assessment & Plan Note (Signed)
Recheck TSH, adjust as needed. Continue current regimen 

## 2020-03-06 NOTE — Assessment & Plan Note (Signed)
Back in with Pain Mgmt for injections to the knee. Continue following with them for this

## 2020-03-06 NOTE — Assessment & Plan Note (Signed)
Has gained 20+ lb the last 6-12 months, unclear if medication related or due to more sedentary lifestyle. Will work on increasing activity as tolerated, trial being off the lyrica per patient request, healthy diet reviewed

## 2020-03-06 NOTE — Assessment & Plan Note (Signed)
Diet controlled, recheck A1C, adjust as needed. Continue working on improving diet and increasing activity

## 2020-03-06 NOTE — Assessment & Plan Note (Signed)
BPs stable and WNL, continue current regimen 

## 2020-03-06 NOTE — Assessment & Plan Note (Signed)
Requesting to come off lyrica due to concerns of weight gain. Will taper over course of 1-2 weeks, then monitor over next few weeks. Concerned about her pain control once off, may need to resume as her pain was significant daily all over body prior to getting on the lyrica. Continue to monitor closely once off.

## 2020-03-06 NOTE — Assessment & Plan Note (Signed)
Fairly well controlled, continue current regimen

## 2020-03-06 NOTE — Assessment & Plan Note (Addendum)
On xanax prn, continue careful rare use in addition to other chronic medications. Currently stable and under good control

## 2020-03-06 NOTE — Assessment & Plan Note (Signed)
Recheck lipids, adjust as needed. Continue working on lifestyle modifications 

## 2020-03-07 ENCOUNTER — Other Ambulatory Visit: Payer: Self-pay

## 2020-03-07 ENCOUNTER — Encounter: Payer: Self-pay | Admitting: Pain Medicine

## 2020-03-07 ENCOUNTER — Ambulatory Visit: Payer: Medicare HMO | Attending: Pain Medicine | Admitting: Pain Medicine

## 2020-03-07 VITALS — BP 146/78 | HR 61 | Temp 97.5°F | Resp 18 | Ht 60.0 in | Wt 231.0 lb

## 2020-03-07 DIAGNOSIS — M25562 Pain in left knee: Secondary | ICD-10-CM | POA: Diagnosis not present

## 2020-03-07 DIAGNOSIS — M1712 Unilateral primary osteoarthritis, left knee: Secondary | ICD-10-CM | POA: Insufficient documentation

## 2020-03-07 DIAGNOSIS — G8929 Other chronic pain: Secondary | ICD-10-CM | POA: Insufficient documentation

## 2020-03-07 DIAGNOSIS — M25561 Pain in right knee: Secondary | ICD-10-CM | POA: Insufficient documentation

## 2020-03-07 MED ORDER — LIDOCAINE HCL 2 % IJ SOLN
INTRAMUSCULAR | Status: AC
  Filled 2020-03-07: qty 10

## 2020-03-07 MED ORDER — ROPIVACAINE HCL 2 MG/ML IJ SOLN
INTRAMUSCULAR | Status: AC
  Filled 2020-03-07: qty 10

## 2020-03-07 MED ORDER — METHYLPREDNISOLONE ACETATE 80 MG/ML IJ SUSP
80.0000 mg | Freq: Once | INTRAMUSCULAR | Status: AC
  Administered 2020-03-07: 80 mg via INTRA_ARTICULAR

## 2020-03-07 MED ORDER — LIDOCAINE HCL 2 % IJ SOLN
20.0000 mL | Freq: Once | INTRAMUSCULAR | Status: AC
  Administered 2020-03-07: 200 mg

## 2020-03-07 MED ORDER — METHYLPREDNISOLONE ACETATE 80 MG/ML IJ SUSP
INTRAMUSCULAR | Status: AC
  Filled 2020-03-07: qty 1

## 2020-03-07 MED ORDER — ROPIVACAINE HCL 2 MG/ML IJ SOLN
4.0000 mL | Freq: Once | INTRAMUSCULAR | Status: AC
  Administered 2020-03-07: 4 mL via INTRA_ARTICULAR

## 2020-03-07 NOTE — Progress Notes (Signed)
Safety precautions to be maintained throughout the outpatient stay will include: orient to surroundings, keep bed in low position, maintain call bell within reach at all times, provide assistance with transfer out of bed and ambulation.  

## 2020-03-07 NOTE — Patient Instructions (Signed)

## 2020-03-07 NOTE — Progress Notes (Signed)
PROVIDER NOTE: Information contained herein reflects review and annotations entered in association with encounter. Interpretation of such information and data should be left to medically-trained personnel. Information provided to patient can be located elsewhere in the medical record under "Patient Instructions". Document created using STT-dictation technology, any transcriptional errors that may result from process are unintentional.    Patient: Cynthia Jones  Service Category: Procedure  Provider: Oswaldo Done, MD  DOB: Jun 09, 1941  DOS: 03/07/2020  Location: [facility]  MRN: [id number]  Setting: Ambulatory - outpatient  Referring Provider: Delano Metz, MD  Type: Established Patient  Specialty: Interventional Pain Management  PCP: [name], PA-C   Primary Reason for Visit: Interventional Pain Management Treatment. CC: Knee Pain (left)  Procedure:          Anesthesia, Analgesia, Anxiolysis:  Type: Diagnostic Intra-Articular Local anesthetic and steroid Knee Injection #2  Region: Lateral infrapatellar Knee Region Level: Knee Joint Laterality: Left knee  Type: Local Anesthesia Indication(s): Analgesia         Local Anesthetic: Lidocaine 1-2% Route: Infiltration ([place]/IM) IV Access: Declined Sedation: Declined   Position: Sitting   Indications: 1. Chronic knee pain (Left)   2. Chronic knee pain (1ry area of Pain) (Bilateral) (L>R)   3. Osteoarthritis of knee (Left)    Pain Score: Pre-procedure: 8 /10 Post-procedure: 8 /10   Pre-op Assessment:  Cynthia Jones is a 79 y.o. (year old), female patient, seen today for interventional treatment. She  has a past surgical history that includes Parotidectomy (Left); Appendectomy; Cholecystectomy; Abdominal hysterectomy; Tonsillectomy; Joint replacement (2014); Eye surgery; and Breast cyst aspiration (Right, 04/18/2013). Cynthia Jones has a current medication list which includes the following  prescription(s): alprazolam, aripiprazole, b complex vitamins, carvedilol, cyclobenzaprine, diclofenac sodium, duloxetine, esomeprazole, levothyroxine, losartan, nystatin, pregabalin, UNABLE TO FIND, UNABLE TO FIND, and vitamin d (ergocalciferol). Her primarily concern today is the Knee Pain (left)  Initial Vital Signs:  Pulse/HCG Rate: 61  Temp: (!) 97.5 F (36.4 C) Resp: 18 BP: (!) 146/78 SpO2: 98 %  BMI: Estimated body mass index is 45.11 kg/m as calculated from the following:   Height as of this encounter: 5' (1.524 m).   Weight as of this encounter: 231 lb (104.8 kg).  Risk Assessment: Allergies: Reviewed. She is allergic to nsaids and erythromycin.  Allergy Precautions: None required Coagulopathies: Reviewed. None identified.  Blood-thinner therapy: None at this time Active Infection(s): Reviewed. None identified. Cynthia Jones is afebrile  Site Confirmation: Cynthia Jones was asked to confirm the procedure and laterality before marking the site Procedure checklist: Completed Consent: Before the procedure and under the influence of no sedative(s), amnesic(s), or anxiolytics, the patient was informed of the treatment options, risks and possible complications. To fulfill our ethical and legal obligations, as recommended by the American Medical Association's Code of Ethics, I have informed the patient of my clinical impression; the nature and purpose of the treatment or procedure; the risks, benefits, and possible complications of the intervention; the alternatives, including doing nothing; the risk(s) and benefit(s) of the alternative treatment(s) or procedure(s); and the risk(s) and benefit(s) of doing nothing. The patient was provided information about the general risks and possible complications associated with the procedure. These may include, but are not limited to: failure to achieve desired goals, infection, bleeding, organ or nerve damage, allergic reactions, paralysis, and  death. In addition, the patient was informed of those risks and complications associated to the procedure, such as failure to decrease pain; infection; bleeding; organ  or nerve damage with subsequent damage to sensory, motor, and/or autonomic systems, resulting in permanent pain, numbness, and/or weakness of one or several areas of the body; allergic reactions; (i.e.: anaphylactic reaction); and/or death. Furthermore, the patient was informed of those risks and complications associated with the medications. These include, but are not limited to: allergic reactions (i.e.: anaphylactic or anaphylactoid reaction(s)); adrenal axis suppression; blood sugar elevation that in diabetics may result in ketoacidosis or comma; water retention that in patients with history of congestive heart failure may result in shortness of breath, pulmonary edema, and decompensation with resultant heart failure; weight gain; swelling or edema; medication-induced neural toxicity; particulate matter embolism and blood vessel occlusion with resultant organ, and/or nervous system infarction; and/or aseptic necrosis of one or more joints. Finally, the patient was informed that Medicine is not an exact science; therefore, there is also the possibility of unforeseen or unpredictable risks and/or possible complications that may result in a catastrophic outcome. The patient indicated having understood very clearly. We have given the patient no guarantees and we have made no promises. Enough time was given to the patient to ask questions, all of which were answered to the patient's satisfaction. Cynthia Jones has indicated that she wanted to continue with the procedure. Attestation: I, the ordering provider, attest that I have discussed with the patient the benefits, risks, side-effects, alternatives, likelihood of achieving goals, and potential problems during recovery for the procedure that I have provided informed consent. Date  Time:  03/07/2020 11:41 AM  Pre-Procedure Preparation:  Monitoring: As per clinic protocol. Respiration, ETCO2, SpO2, BP, heart rate and rhythm monitor placed and checked for adequate function Safety Precautions: Patient was assessed for positional comfort and pressure points before starting the procedure. Time-out: I initiated and conducted the "Time-out" before starting the procedure, as per protocol. The patient was asked to participate by confirming the accuracy of the "Time Out" information. Verification of the correct person, site, and procedure were performed and confirmed by me, the nursing staff, and the patient. "Time-out" conducted as per Joint Commission's Universal Protocol (UP.01.01.01). Time: 1214  Description of Procedure:          Target Area: Knee Joint Approach: Just above the Lateral tibial plateau, lateral to the infrapatellar tendon. Area Prepped: Entire knee area, from the mid-thigh to the mid-shin. DuraPrep (Iodine Povacrylex [0.7% available iodine] and Isopropyl Alcohol, 74% w/w) Safety Precautions: Aspiration looking for blood return was conducted prior to all injections. At no point did we inject any substances, as a needle was being advanced. No attempts were made at seeking any paresthesias. Safe injection practices and needle disposal techniques used. Medications properly checked for expiration dates. SDV (single dose vial) medications used. Description of the Procedure: Protocol guidelines were followed. The patient was placed in position over the fluoroscopy table. The target area was identified and the area prepped in the usual manner. Skin & deeper tissues infiltrated with local anesthetic. Appropriate amount of time allowed to pass for local anesthetics to take effect. The procedure needles were then advanced to the target area. Proper needle placement secured. Negative aspiration confirmed. Solution injected in intermittent fashion, asking for systemic symptoms every 0.5cc of  injectate. The needles were then removed and the area cleansed, making sure to leave some of the prepping solution back to take advantage of its long term bactericidal properties. Vitals:   03/07/20 1140  BP: (!) 146/78  Pulse: 61  Resp: 18  Temp: (!) 97.5 F (36.4 C)  TempSrc: Temporal  SpO2: 98%  Weight: 231 lb (104.8 kg)  Height: 5' (1.524 m)    Start Time: 1214 hrs. End Time: 1216 hrs. Materials:  Needle(s) Type: Regular needle Gauge: 25G Length: 1.5-in Medication(s): Please see orders for medications and dosing details.  Imaging Guidance:          Type of Imaging Technique: None used Indication(s): N/A Exposure Time: No patient exposure Contrast: None used. Fluoroscopic Guidance: N/A Ultrasound Guidance: N/A Interpretation: N/A  Antibiotic Prophylaxis:   Anti-infectives (From admission, onward)   None     Indication(s): None identified  Post-operative Assessment:  Post-procedure Vital Signs:  Pulse/HCG Rate: 61  Temp: (!) 97.5 F (36.4 C) Resp: 18 BP: (!) 146/78 SpO2: 98 %  EBL: None  Complications: No immediate post-treatment complications observed by team, or reported by patient.  Note: The patient tolerated the entire procedure well. A repeat set of vitals were taken after the procedure and the patient was kept under observation following institutional policy, for this type of procedure. Post-procedural neurological assessment was performed, showing return to baseline, prior to discharge. The patient was provided with post-procedure discharge instructions, including a section on how to identify potential problems. Should any problems arise concerning this procedure, the patient was given instructions to immediately contact us, at any time, without hesitation. In any case, we plan to contact the patient by telephone for a follow-up status report regarding this interventional procedure.  Comments:  No additional relevant information.  Plan of Care   Orders:  Orders Placed This Encounter  Procedures  . KNEE INJECTION    Local Anesthetic & Steroid injection.    Scheduling Instructions:     Side(s): Left Knee     Sedation: None     Timeframe: Today    Order Specific Question:   Where will this procedure be performed?    Answer:   [facility]  . Informed Consent Details: Physician/Practitioner Attestation; Transcribe to consent form and obtain patient signature    Provider Attestation: I, Kashlynn Kundert A. Laban Emperor, MD, (Pain Management Specialist), the physician/practitioner, attest that I have discussed with the patient the benefits, risks, side effects, alternatives, likelihood of achieving goals and potential problems during recovery for the procedure that I have provided informed consent.    Scheduling Instructions:     Procedure: Left-sided intra-articular knee arthrocentesis (aspiration and/or injection)     Indications: Chronic left-sided knee pain secondary to knee arthropathy/arthralgia     Note: Always confirm laterality of pain with Ms. Saner, before procedure.     Transcribe to consent form and obtain patient signature.  . Provide equipment / supplies at bedside    Equipment required: Single use, disposable, "Block Tray"    Standing Status:   Standing    Number of Occurrences:   1    Order Specific Question:   Specify    Answer:   Block Tray   Chronic Opioid Analgesic:  Oxycodone IR 5 mg, 1 tab PO q8 hrs (15 mg/day of oxycodone) last filled on 11/10/2019.  She has been taking CBD Gummies. MME/day:15mg /day.   Medications ordered for procedure: Meds ordered this encounter  Medications  . lidocaine (XYLOCAINE) 2 % (with pres) injection 400 mg  . methylPREDNISolone acetate (DEPO-MEDROL) injection 80 mg  . ropivacaine (PF) 2 mg/mL (0.2%) (NAROPIN) injection 4 mL   Medications administered: We administered lidocaine, methylPREDNISolone acetate, and ropivacaine (PF) 2 mg/mL (0.2%).  See the medical record for  exact dosing, route, and time of administration.  Follow-up plan:  Return in about 2 weeks (around 03/21/2020) for VV(15-min), (PP).       Interventional treatment options: Planned, scheduled, and/or pending:   No further increases on opioid analgesics    Under consideration:   Possible right Genicular nerve RFA  Diagnostic left Genicular NB Possibleleft Genicular nerve RFA Possible bilateral lumbar facet RFA Diagnostic left LESI Diagnostic caudal ESI + epidurogram Possible Racz procedure Diagnostic bilateral cervical facet block Possible bilateral cervical facet RFA Diagnostic right versus left CESI Diagnostic bilateral IA shoulder joint injection Diagnostic bilateral suprascapular NB Possible bilateral suprascapular nerve RFA   Therapeutic/palliative (PRN):  Diagnostic right Genicular NB #2(90/90/50)  Diagnosticleft IA knee joint injection #2(100/100/90)  Palliativeleft IA Hyalgan Knee injection S2N1(last completed on 11/01/2018)  Diagnostic bilateral lumbar facet block #2     Recent Visits Date Type Provider Dept  02/26/20 Telemedicine Delano Metz, MD [facility]  Showing recent visits within past 90 days and meeting all other requirements Today's Visits Date Type Provider Dept  03/07/20 Procedure visit Delano Metz, MD [facility]  Showing today's visits and meeting all other requirements Future Appointments Date Type Provider Dept  03/28/20 Appointment Delano Metz, MD [facility]  Showing future appointments within next 90 days and meeting all other requirements  Disposition: Discharge home  Discharge (Date  Time): 03/07/2020; 1219 hrs.   Primary Care Physician: [name], PA-C Location: [facility] Note by: Oswaldo Done, MD Date: 03/07/2020; Time: 12:44 PM  Disclaimer:  Medicine is not an Visual merchandiser. The only guarantee in medicine is that  nothing is guaranteed. It is important to note that the decision to proceed with this intervention was based on the information collected from the patient. The Data and conclusions were drawn from the patient's questionnaire, the interview, and the physical examination. Because the information was provided in large part by the patient, it cannot be guaranteed that it has not been purposely or unconsciously manipulated. Every effort has been made to obtain as much relevant data as possible for this evaluation. It is important to note that the conclusions that lead to this procedure are derived in large part from the available data. Always take into account that the treatment will also be dependent on availability of resources and existing treatment guidelines, considered by other Pain Management Practitioners as being common knowledge and practice, at the time of the intervention. For Medico-Legal purposes, it is also important to point out that variation in procedural techniques and pharmacological choices are the acceptable norm. The indications, contraindications, technique, and results of the above procedure should only be interpreted and judged by a Board-Certified Interventional Pain Specialist with extensive familiarity and expertise in the same exact procedure and technique.

## 2020-03-08 ENCOUNTER — Telehealth: Payer: Self-pay

## 2020-03-08 NOTE — Telephone Encounter (Signed)
Post procedure phone call. Patient states she is doing good.  

## 2020-03-11 ENCOUNTER — Other Ambulatory Visit: Payer: Self-pay

## 2020-03-11 MED ORDER — ALPRAZOLAM 0.25 MG PO TABS: 0.2500 mg | ORAL_TABLET | Freq: Two times a day (BID) | ORAL | 0 refills | Status: DC | PRN

## 2020-03-11 NOTE — Telephone Encounter (Signed)
Patient last seen 03/01/20.  

## 2020-03-13 ENCOUNTER — Other Ambulatory Visit: Payer: Medicare HMO | Admitting: Primary Care

## 2020-03-19 ENCOUNTER — Other Ambulatory Visit: Payer: Self-pay

## 2020-03-19 ENCOUNTER — Other Ambulatory Visit: Payer: Medicare HMO | Admitting: Primary Care

## 2020-03-22 DIAGNOSIS — E1140 Type 2 diabetes mellitus with diabetic neuropathy, unspecified: Secondary | ICD-10-CM | POA: Diagnosis not present

## 2020-03-22 DIAGNOSIS — F329 Major depressive disorder, single episode, unspecified: Secondary | ICD-10-CM | POA: Diagnosis not present

## 2020-03-22 DIAGNOSIS — G8929 Other chronic pain: Secondary | ICD-10-CM | POA: Diagnosis not present

## 2020-03-22 DIAGNOSIS — M5481 Occipital neuralgia: Secondary | ICD-10-CM | POA: Diagnosis not present

## 2020-03-22 DIAGNOSIS — M797 Fibromyalgia: Secondary | ICD-10-CM | POA: Diagnosis not present

## 2020-03-22 DIAGNOSIS — F419 Anxiety disorder, unspecified: Secondary | ICD-10-CM | POA: Diagnosis not present

## 2020-03-22 DIAGNOSIS — M1712 Unilateral primary osteoarthritis, left knee: Secondary | ICD-10-CM | POA: Diagnosis not present

## 2020-03-22 DIAGNOSIS — M8949 Other hypertrophic osteoarthropathy, multiple sites: Secondary | ICD-10-CM | POA: Diagnosis not present

## 2020-03-22 DIAGNOSIS — I10 Essential (primary) hypertension: Secondary | ICD-10-CM | POA: Diagnosis not present

## 2020-03-23 ENCOUNTER — Other Ambulatory Visit: Payer: Self-pay | Admitting: Family Medicine

## 2020-03-23 DIAGNOSIS — E118 Type 2 diabetes mellitus with unspecified complications: Secondary | ICD-10-CM

## 2020-03-23 NOTE — Telephone Encounter (Signed)
Requested medication (s) are due for refill today: yes  Requested medication (s) are on the active medication list: yes  Last refill:  12/25/19  Future visit scheduled: yes  Notes to clinic:  med not assigned to a protocol, review manually   Requested Prescriptions  Pending Prescriptions Disp Refills   nystatin (MYCOSTATIN/NYSTOP) powder [Pharmacy Med Name: NYSTATIN 100,000 UNIT/GM POWD] 60 g 1    Sig: APPLY TO AFFECTED AREA 3 TIMES A DAY      Off-Protocol Failed - 03/23/2020 12:07 PM      Failed - Medication not assigned to a protocol, review manually.      Passed - Valid encounter within last 12 months    Recent Outpatient Visits           3 weeks ago Essential hypertension   [facility] Roosvelt Maser [place], [place]   2 months ago Osteoarthritis of knee (Left)   [facility] [name], [place]   3 months ago Fibromyalgia   [facility] Roosvelt Maser [place], [place]   3 months ago Acute non-recurrent maxillary sinusitis   [facility] Roosvelt Maser [place], [place]   4 months ago Fibromyalgia   [facility] [place], Salley Hews, [place]       Future Appointments             In 5 days Delano Metz, MD [facility]   In 1 week Maurice March, [place] He[place], PA-C [facility]

## 2020-03-25 ENCOUNTER — Telehealth: Payer: Self-pay | Admitting: Family Medicine

## 2020-03-25 NOTE — Telephone Encounter (Signed)
Called and LVM letting Cynthia Jones know that [name] gave the ok for verbal orders requested.

## 2020-03-25 NOTE — Telephone Encounter (Signed)
Home Health Verbal Orders - Caller/Agency: Beverly// [facility] Callback Number: [phone] secure line  Requesting OT/PT/Skilled Nursing/Social Work/Speech Therapy: Recert skilled nursing, Social work, PT Frequency:

## 2020-03-25 NOTE — Telephone Encounter (Signed)
OK to give verbal.

## 2020-03-27 ENCOUNTER — Telehealth: Payer: Self-pay | Admitting: Family Medicine

## 2020-03-27 ENCOUNTER — Encounter: Payer: Self-pay | Admitting: Pain Medicine

## 2020-03-27 NOTE — Telephone Encounter (Signed)
Routing to provider to advise.  

## 2020-03-27 NOTE — Telephone Encounter (Signed)
Beverly with [facility] called for 2 verbal orders Effective 7/23 Speech Therapy, already has seen last week so order is for 1 wk 2. 2nd order Home Health Aid 1 wk 1 and 2 wk 4 eff July 29 CB # is [phone] Jacquelynn Cree

## 2020-03-28 ENCOUNTER — Other Ambulatory Visit: Payer: Self-pay

## 2020-03-28 ENCOUNTER — Ambulatory Visit: Payer: Medicare HMO | Attending: Pain Medicine | Admitting: Pain Medicine

## 2020-03-28 DIAGNOSIS — M1712 Unilateral primary osteoarthritis, left knee: Secondary | ICD-10-CM

## 2020-03-28 DIAGNOSIS — M25561 Pain in right knee: Secondary | ICD-10-CM | POA: Diagnosis not present

## 2020-03-28 DIAGNOSIS — M25562 Pain in left knee: Secondary | ICD-10-CM | POA: Diagnosis not present

## 2020-03-28 DIAGNOSIS — G8929 Other chronic pain: Secondary | ICD-10-CM

## 2020-03-28 NOTE — Patient Instructions (Addendum)
____________________________________________________________________________________________  CANNABIDIOL (AKA: CBD Oil or Pills)  Applies to: All patients receiving prescriptions of controlled substances (written and/or electronic).  General Information: Cannabidiol (CBD), a derivative of Marijuana, was discovered in [birth year]. It is one of some 113 identified cannabinoids in cannabis (Marijuana) plants, accounting for up to 40% of the plant's extract. As of 2018, preliminary clinical research on cannabidiol included studies of anxiety, cognition, movement disorders, and pain.  Cannabidiol is consummed in multiple ways, including inhalation of cannabis smoke or vapor, as an aerosol spray into the cheek, and by mouth. It may be supplied as CBD oil containing CBD as the active ingredient (no added tetrahydrocannabinol (THC) or terpenes), a full-plant CBD-dominant hemp extract oil, capsules, dried cannabis, or as a liquid solution. CBD is thought not have the same psychoactivity as THC, and may affect the actions of THC. Studies suggest that CBD may interact with different biological targets, including cannabinoid receptors and other neurotransmitter receptors. As of 2018 the mechanism of action for its biological effects has not been determined.  In the [place], cannabidiol has a limited approval by the Food and Drug Administration (FDA) for treatment of only two types of epilepsy disorders. The side effects of long-term use of the drug include somnolence, decreased appetite, diarrhea, fatigue, malaise, weakness, sleeping problems, and others.  CBD remains a Schedule I drug prohibited for any use.  Legality: Some manufacturers ship CBD products nationally, an illegal action which the FDA has not enforced in 2018, with CBD remaining the subject of an FDA investigational new drug evaluation, and is not considered legal as a dietary supplement or food ingredient as of December 2018. Federal illegality has  made it difficult historically to conduct research on CBD. CBD is openly sold in head shops and health food stores in some states where such sales have not been explicitly legalized.  Warning: Because it is not FDA approved for general use or treatment of pain, it is not required to undergo the same manufacturing controls as prescription drugs.  This means that the available cannabidiol (CBD) may be contaminated with THC.  If this is the case, it will trigger a positive urine drug screen (UDS) test for cannabinoids (Marijuana).  Because a positive UDS for illicit substances is a violation of our medication agreement, your opioid analgesics (pain medicine) may be permanently discontinued. (Last update: 03/20/2020) ____________________________________________________________________________________________   ____________________________________________________________________________________________  Medication Rules  Purpose: To inform patients, and their family members, of our rules and regulations.  Applies to: All patients receiving prescriptions (written or electronic).  Pharmacy of record: Pharmacy where electronic prescriptions will be sent. If written prescriptions are taken to a different pharmacy, please inform the nursing staff. The pharmacy listed in the electronic medical record should be the one where you would like electronic prescriptions to be sent.  Electronic prescriptions: In compliance with the [facility] Strengthen Opioid Misuse Prevention (STOP) Act of 2017 (Session Conni Elliot [phone]), effective August 31, 2018, all controlled substances must be electronically prescribed. Calling prescriptions to the pharmacy will cease to exist.  Prescription refills: Only during scheduled appointments. Applies to all prescriptions.  NOTE: The following applies primarily to controlled substances (Opioid* Pain Medications).   Type of encounter (visit): For patients receiving controlled  substances, face-to-face visits are required. (Not an option or up to the patient.)  Patient's responsibilities: 1. Pain Pills: Bring all pain pills to every appointment (except for procedure appointments). 2. Pill Bottles: Bring pills in original pharmacy bottle. Always bring the newest bottle.  Bring bottle, even if empty. 3. Medication refills: You are responsible for knowing and keeping track of what medications you take and those you need refilled. The day before your appointment: write a list of all prescriptions that need to be refilled. The day of the appointment: give the list to the admitting nurse. Prescriptions will be written only during appointments. No prescriptions will be written on procedure days. If you forget a medication: it will not be "Called in", "Faxed", or "electronically sent". You will need to get another appointment to get these prescribed. No early refills. Do not call asking to have your prescription filled early. 4. Prescription Accuracy: You are responsible for carefully inspecting your prescriptions before leaving our office. Have the discharge nurse carefully go over each prescription with you, before taking them home. Make sure that your name is accurately spelled, that your address is correct. Check the name and dose of your medication to make sure it is accurate. Check the number of pills, and the written instructions to make sure they are clear and accurate. Make sure that you are given enough medication to last until your next medication refill appointment. 5. Taking Medication: Take medication as prescribed. When it comes to controlled substances, taking less pills or less frequently than prescribed is permitted and encouraged. Never take more pills than instructed. Never take medication more frequently than prescribed.  6. Inform other Doctors: Always inform, all of your healthcare providers, of all the medications you take. 7. Pain Medication from other  Providers: You are not allowed to accept any additional pain medication from any other Doctor or Healthcare provider. There are two exceptions to this rule. (see below) In the event that you require additional pain medication, you are responsible for notifying us, as stated below. 8. Medication Agreement: You are responsible for carefully reading and following our Medication Agreement. This must be signed before receiving any prescriptions from our practice. Safely store a copy of your signed Agreement. Violations to the Agreement will result in no further prescriptions. (Additional copies of our Medication Agreement are available upon request.) 9. Laws, Rules, & Regulations: All patients are expected to follow all [address] and [employer], Rules, [employer]. Ignorance of the Laws does not constitute a valid excuse.  10. Illegal drugs and Controlled Substances: The use of illegal substances (including, but not limited to marijuana and its derivatives) and/or the illegal use of any controlled substances is strictly prohibited. Violation of this rule may result in the immediate and permanent discontinuation of any and all prescriptions being written by our practice. The use of any illegal substances is prohibited. 11. Adopted CDC guidelines & recommendations: Target dosing levels will be at or below 60 MME/day. Use of benzodiazepines** is not recommended.  Exceptions: There are only two exceptions to the rule of not receiving pain medications from other Healthcare Providers. 1. Exception #1 (Emergencies): In the event of an emergency (i.e.: accident requiring emergency care), you are allowed to receive additional pain medication. However, you are responsible for: As soon as you are able, call our office [phone], at any time of the day or night, and leave a message stating your name, the date and nature of the emergency, and the name and dose of the medication prescribed. In the event that your  call is answered by a member of our staff, make sure to document and save the date, time, and the name of the person that took your information.  2. Exception #2 (Planned  Surgery): In the event that you are scheduled by another doctor or dentist to have any type of surgery or procedure, you are allowed (for a period no longer than 30 days), to receive additional pain medication, for the acute post-op pain. However, in this case, you are responsible for picking up a copy of our "Post-op Pain Management for Surgeons" handout, and giving it to your surgeon or dentist. This document is available at our office, and does not require an appointment to obtain it. Simply go to our office during business hours (Monday-Thursday from 8:00 AM to 4:00 PM) (Friday 8:00 AM to 12:00 Noon) or if you have a scheduled appointment with [place], prior to your surgery, and ask for it by name. In addition, you will need to provide [place] with your name, name of your surgeon, type of surgery, and date of procedure or surgery.  *Opioid medications include: morphine, codeine, oxycodone, oxymorphone, hydrocodone, hydromorphone, meperidine, tramadol, tapentadol, buprenorphine, fentanyl, methadone. **Benzodiazepine medications include: diazepam (Valium), alprazolam (Xanax), clonazepam (Klonopine), lorazepam (Ativan), clorazepate (Tranxene), chlordiazepoxide (Librium), estazolam (Prosom), oxazepam (Serax), temazepam (Restoril), triazolam (Halcion) (Last updated: 10/28/2017) ____________________________________________________________________________________________   ____________________________________________________________________________________________  Medication Recommendations and Reminders  Applies to: All patients receiving prescriptions (written and/or electronic).  Medication Rules & Regulations: These rules and regulations exist for your safety and that of others. They are not flexible and neither are we. Dismissing or  ignoring them will be considered "non-compliance" with medication therapy, resulting in complete and irreversible termination of such therapy. (See document titled "Medication Rules" for more details.) In all conscience, because of safety reasons, we cannot continue providing a therapy where the patient does not follow instructions.  Pharmacy of record:   Definition: This is the pharmacy where your electronic prescriptions will be sent.   We do not endorse any particular pharmacy, however, we have experienced problems with [facility] not securing enough medication supply for the community.  We do not restrict you in your choice of pharmacy. However, once we write for your prescriptions, we will NOT be re-sending more prescriptions to fix restricted supply problems created by your pharmacy, or your insurance.   The pharmacy listed in the electronic medical record should be the one where you want electronic prescriptions to be sent.  If you choose to change pharmacy, simply notify our nursing staff.  Recommendations:  Keep all of your pain medications in a safe place, under lock and key, even if you live alone. We will NOT replace lost, stolen, or damaged medication.  After you fill your prescription, take 1 week's worth of pills and put them away in a safe place. You should keep a separate, properly labeled bottle for this purpose. The remainder should be kept in the original bottle. Use this as your primary supply, until it runs out. Once it's gone, then you know that you have 1 week's worth of medicine, and it is time to come in for a prescription refill. If you do this correctly, it is unlikely that you will ever run out of medicine.  To make sure that the above recommendation works, it is very important that you make sure your medication refill appointments are scheduled at least 1 week before you run out of medicine. To do this in an effective manner, make sure that you do not leave the office  without scheduling your next medication management appointment. Always ask the nursing staff to show you in your prescription , when your medication will be running out. Then arrange for the  receptionist to get you a return appointment, at least 7 days before you run out of medicine. Do not wait until you have 1 or 2 pills left, to come in. This is very poor planning and does not take into consideration that we may need to cancel appointments due to bad weather, sickness, or emergencies affecting our staff.  DO NOT ACCEPT A "Partial Fill": If for any reason your pharmacy does not have enough pills/tablets to completely fill or refill your prescription, do not allow for a "partial fill". The law allows the pharmacy to complete that prescription within 72 hours, without requiring a new prescription. If they do not fill the rest of your prescription within those 72 hours, you will need a separate prescription to fill the remaining amount, which we will NOT provide. If the reason for the partial fill is your insurance, you will need to talk to the pharmacist about payment alternatives for the remaining tablets, but again, DO NOT ACCEPT A PARTIAL FILL, unless you can trust your pharmacist to obtain the remainder of the pills within 72 hours.  Prescription refills and/or changes in medication(s):   Prescription refills, and/or changes in dose or medication, will be conducted only during scheduled medication management appointments. (Applies to both, written and electronic prescriptions.)  No refills on procedure days. No medication will be changed or started on procedure days. No changes, adjustments, and/or refills will be conducted on a procedure day. Doing so will interfere with the diagnostic portion of the procedure.  No phone refills. No medications will be "called into the pharmacy".  No Fax refills.  No weekend refills.  No Holliday refills.  No after hours refills.  Remember:  Business hours  are:  Monday to Thursday 8:00 AM to 4:00 PM Provider's Schedule: Delano Metz, MD - Appointments are:  Medication management: Monday and Wednesday 8:00 AM to 4:00 PM Procedure day: Tuesday and Thursday 7:30 AM to 4:00 PM Edward Jolly, MD - Appointments are:  Medication management: Tuesday and Thursday 8:00 AM to 4:00 PM Procedure day: Monday and Wednesday 7:30 AM to 4:00 PM (Last update: 03/20/2020) ____________________________________________________________________________________________   ____________________________________________________________________________________________  Preparing for your procedure (without sedation)  Procedure appointments are limited to planned procedures: . No Prescription Refills. . No disability issues will be discussed. . No medication changes will be discussed.  Instructions: . Oral Intake: Do not eat or drink anything for at least 6 hours prior to your procedure. (Exception: Blood Pressure Medication. See below.) . Transportation: Unless otherwise stated by your physician, you may drive yourself after the procedure. . Blood Pressure Medicine: Do not forget to take your blood pressure medicine with a sip of water the morning of the procedure. If your Diastolic (lower reading)is above 100 mmHg, elective cases will be cancelled/rescheduled. . Blood thinners: These will need to be stopped for procedures. Notify our staff if you are taking any blood thinners. Depending on which one you take, there will be specific instructions on how and when to stop it. . Diabetics on insulin: Notify the staff so that you can be scheduled 1st case in the morning. If your diabetes requires high dose insulin, take only  of your normal insulin dose the morning of the procedure and notify the staff that you have done so. . Preventing infections: Shower with an antibacterial soap the morning of your procedure.  . Build-up your immune system: Take 1000 mg of Vitamin C  with every meal (3 times a day) the day prior to  your procedure. [name] Antibiotics: Inform the staff if you have a condition or reason that requires you to take antibiotics before dental procedures. . Pregnancy: If you are pregnant, call and cancel the procedure. . Sickness: If you have a cold, fever, or any active infections, call and cancel the procedure. . Arrival: You must be in the facility at least 30 minutes prior to your scheduled procedure. . Children: Do not bring any children with you. . Dress appropriately: Bring dark clothing that you would not mind if they get stained. . Valuables: Do not bring any jewelry or valuables.  Reasons to call and reschedule or cancel your procedure: (Following these recommendations will minimize the risk of a serious complication.) . Surgeries: Avoid having procedures within 2 weeks of any surgery. (Avoid for 2 weeks before or after any surgery). . Flu Shots: Avoid having procedures within 2 weeks of a flu shots or . (Avoid for 2 weeks before or after immunizations). . Barium: Avoid having a procedure within 7-10 days after having had a radiological study involving the use of radiological contrast. (Myelograms, Barium swallow or enema study). . Heart attacks: Avoid any elective procedures or surgeries for the initial 6 months after a "Myocardial Infarction" (Heart Attack). . Blood thinners: It is imperative that you stop these medications before procedures. Let us know if you if you take any blood thinner.  . Infection: Avoid procedures during or within two weeks of an infection (including chest colds or gastrointestinal problems). Symptoms associated with infections include: Localized redness, fever, chills, night sweats or profuse sweating, burning sensation when voiding, cough, congestion, stuffiness, runny nose, sore throat, diarrhea, nausea, vomiting, cold or Flu symptoms, recent or current infections. It is specially important if the infection is over the  area that we intend to treat. [name] Heart and lung problems: Symptoms that may suggest an active cardiopulmonary problem include: cough, chest pain, breathing difficulties or shortness of breath, dizziness, ankle swelling, uncontrolled high or unusually low blood pressure, and/or palpitations. If you are experiencing any of these symptoms, cancel your procedure and contact your primary care physician for an evaluation.  Remember:  Regular Business hours are:  Monday to Thursday 8:00 AM to 4:00 PM  Provider's Schedule: Delano Metz, MD:  Procedure days: Tuesday and Thursday 7:30 AM to 4:00 PM  Edward Jolly, MD:  Procedure days: Monday and Wednesday 7:30 AM to 4:00 PM ____________________________________________________________________________________________   Sodium Hyaluronate intra-articular injection What is this medicine? SODIUM HYALURONATE (SOE dee um hye al yoor ON ate) is used to treat pain in the knee due to osteoarthritis. This medicine may be used for other purposes; ask your health care provider or pharmacist if you have questions. COMMON BRAND NAME(S): Amvisc, DUROLANE, Euflexxa, GELSYN-3, Hyalgan, Hymovis, Monovisc, Orthovisc, Supartz, Supartz FX, TriVisc, VISCO What should I tell my health care provider before I take this medicine? They need to know if you have any of these conditions:  bleeding disorders  glaucoma  infection in the knee joint  skin conditions or sensitivity  skin infection  an unusual allergic reaction to sodium hyaluronate, other medicines, foods, dyes, or preservatives. Different brands of sodium hyaluronate contain different allergens. Some may contain egg. Talk to your doctor about your allergies to make sure that you get the right product.  pregnant or trying to get pregnant  breast-feeding How should I use this medicine? This medicine is for injection into the knee joint. It is given by a health care professional in a hospital or clinic  setting. Talk to your pediatrician regarding the use of this medicine in children. Special care may be needed. Overdosage: If you think you have taken too much of this medicine contact a poison control center or emergency room at once. NOTE: This medicine is only for you. Do not share this medicine with others. What if I miss a dose? This does not apply. What may interact with this medicine? Interactions are not expected. This list may not describe all possible interactions. Give your health care provider a list of all the medicines, herbs, non-prescription drugs, or dietary supplements you use. Also tell them if you smoke, drink alcohol, or use illegal drugs. Some items may interact with your medicine. What should I watch for while using this medicine? Tell your doctor or healthcare professional if your symptoms do not start to get better or if they get worse. If receiving this medicine for osteoarthritis, limit your activity after you receive your injection. Avoid physical activity for 48 hours following your injection to keep your knee from swelling. Do not stand on your feet for more than 1 hour at a time during the first 48 hours following your injection. Ask your doctor or healthcare professional about when you can begin major physical activity again. What side effects may I notice from receiving this medicine? Side effects that you should report to your doctor or health care professional as soon as possible:  allergic reactions like skin rash, itching or hives, swelling of the face, lips, or tongue  dizziness  facial flushing  pain, tingling, numbness in the hands or feet  vision changes if received this medicine during eye surgery Side effects that usually do not require medical attention (report to your doctor or health care professional if they continue or are bothersome):  back pain  bruising at site where injected  chills  diarrhea  fever  headache  joint  pain  joint stiffness  joint swelling  muscle cramps  muscle pain  nausea, vomiting  pain, redness, or irritation at site where injected  weak or tired This list may not describe all possible side effects. Call your doctor for medical advice about side effects. You may report side effects to FDA at 1-800-FDA-1088. Where should I keep my medicine? This drug is given in a hospital or clinic and will not be stored at home. NOTE: This sheet is a summary. It may not cover all possible information. If you have questions about this medicine, talk to your doctor, pharmacist, or health care provider.  2020 [employer]/Gold Standard (2015-09-19 08:34:51)

## 2020-03-28 NOTE — Telephone Encounter (Signed)
Called and left [place] a detailed VM on secure line giving verbal orders per [name].

## 2020-03-28 NOTE — Telephone Encounter (Signed)
OK to give verbal.

## 2020-03-28 NOTE — Progress Notes (Signed)
Patient: Cynthia Jones [id number]  Service Category: E/M  Provider: Gaspar Cola, MD  DOB: November 21, 1940  DOS: 03/28/2020  Location: Office  MRN: [id number]  Setting: Ambulatory outpatient  Referring Provider: Volney American,*  Type: Established Patient  Specialty: Interventional Pain Management  PCP: Volney American, PA-C  Location: Remote location  Delivery: TeleHealth     Virtual Encounter - Pain Management PROVIDER NOTE: Information contained herein reflects review and annotations entered in association with encounter. Interpretation of such information and data should be left to medically-trained personnel. Information provided to patient can be located elsewhere in the medical record under "Patient Instructions". Document created using STT-dictation technology, any transcriptional errors that may result from process are unintentional.    Contact & Pharmacy Preferred: [phone] Home: [phone] (home) Mobile: [phone] (mobile) E-mail: [email or web address]  [id number] [id number]: [phone]: [phone]  Pre-screening  Cynthia Jones offered "in-person" vs "virtual" encounter. She indicated preferring virtual for this encounter.   Reason COVID-19*  Social distancing based on CDC and AMA recommendations.   I contacted SZoella Jones on 03/28/2020 via telephone.      I clearly identified myself as FGaspar Cola MD. I verified that I was speaking with the correct person using two identifiers (Name: Cynthia Jones and date of birth: 77/16/1942.  Consent I sought verbal advanced consent from SGrayling Jones virtual visit interactions. I informed Cynthia Jones possible security and privacy concerns, risks, and limitations associated with providing "not-in-person" medical evaluation and management services. I also informed Cynthia Jones the availability of  "in-person" appointments. Finally, I informed her that there would be a charge for the virtual visit and that she could be  personally, fully or partially, financially responsible for it. Cynthia Jones understanding and agreed to proceed.   Historic Elements   Ms. SDAURICE OVANDOis a 79y.o. year old, female patient evaluated today after her last contact with our practice on 03/08/2020. Cynthia Jones has a past medical history of Acute anxiety (03/06/2015), Allergy, Anxiety, Degenerative joint disease of knee, Depression, Fibromyalgia, Obesity, Osteopenia, and RAD (reactive airway disease). She also  has a past surgical history that includes Parotidectomy (Left); Appendectomy; Cholecystectomy; Abdominal hysterectomy; Tonsillectomy; Joint replacement (2014); Eye surgery; and Breast cyst aspiration (Right, 04/18/2013). Ms. JDeoliveirahas a current medication list which includes the following prescription(s): alprazolam, aripiprazole, b complex vitamins, carvedilol, cyclobenzaprine, diclofenac sodium, duloxetine, esomeprazole, levothyroxine, losartan, nystatin, pregabalin, UNABLE TO FIND, UNABLE TO FIND, and vitamin d (ergocalciferol). She  reports that she has never smoked. She has never used smokeless tobacco. She reports that she does not drink alcohol and does not use drugs. Ms. JJorgeis allergic to nsaids and erythromycin.   HPI  Today, she is being contacted for a post-procedure assessment.  The patient indicates having done great with the intra-articular steroid injection.  At this point she has attained 77% relief of the pain on the left knee.  She also points out that she hardly ever has any pain on the right one.  In view of the fact that she is still having some pain on the left knee, today I have talked to her about the possibility of doing some intra-articular Hyalgan knee injections (gel injections).  I spoke to the patient about the treatment, what it supposed to do, and the  limitations (does not last forever).  She indicated being very  much interested in that and asked if we could go ahead and schedule her for the first injection.  We will go ahead and proceed with that today.     Post-Procedure Evaluation  Procedure (03/07/2020): Diagnostic left IA knee injection with steroids #2, no fluoroscopy or IV sedation Pre-procedure pain level: 8/10 Post-procedure: 8/10 No initial benefit, possibly due to rapid discharge after no sedation procedure, without enough time to allow full onset of block.  Sedation: None.  Effectiveness during initial hour after procedure(Ultra-Short Term Relief): 100 %.  Local anesthetic used: Long-acting (4-6 hours) Effectiveness: Defined as any analgesic benefit obtained secondary to the administration of local anesthetics. This carries significant diagnostic value as to the etiological location, or anatomical origin, of the pain. Duration of benefit is expected to coincide with the duration of the local anesthetic used.  Effectiveness during initial 4-6 hours after procedure(Short-Term Relief): 100 %.  Long-term benefit: Defined as any relief past the pharmacologic duration of the local anesthetics.  Effectiveness past the initial 6 hours after procedure(Long-Term Relief): 75 %.  Current benefits: Defined as benefit that persist at this time.   Analgesia:  75% relief Function: Cynthia Jones reports improvement in function ROM: Cynthia Jones reports improvement in ROM  Pharmacotherapy Assessment  Analgesic: No opioid analgesics prescribed by our practice.  At one time, I did prescribe some oxycodone for her, but she is currently not using it.  She did mention to me that she has been using the CBD Gummies.  I took the time to explain to her the medical legal issues associated with that and the possibility that she may test positive on a UDS for carboxy-THC. MME/day:'0mg'$ /day.   UDS:  Summary  Date Value Ref Range Status  02/09/2018 FINAL   Final    Comment:    ==================================================================== TOXASSURE SELECT 13 (MW) ==================================================================== Test                             Result       Flag       Units Drug Present and Declared for Prescription Verification   Oxycodone                      1276         EXPECTED   ng/mg creat   Noroxycodone                   2447         EXPECTED   ng/mg creat    Sources of oxycodone include scheduled prescription medications.    Noroxycodone is an expected metabolite of oxycodone. Drug Absent but Declared for Prescription Verification   Alprazolam                     Not Detected UNEXPECTED ng/mg creat ==================================================================== Test                      Result    Flag   Units      Ref Range   Creatinine              109              mg/dL      >=20 ==================================================================== Declared Medications:  The flagging and interpretation on this report are based on the  following declared medications.  Unexpected results may arise from  inaccuracies in the declared  medications.  **Note: The testing scope of this panel includes these medications:  Alprazolam (Xanax)  Oxycodone  **Note: The testing scope of this panel does not include following  reported medications:  Carvedilol  Duloxetine  Losartan (Losartan Potassium)  Nystatin  Omeprazole (Nexium)  Polymyxin (PolyTrim)  Trimethoprim (PolyTrim) ==================================================================== For clinical consultation, please call [phone]. ====================================================================     Laboratory Chemistry Profile   Renal Lab Results  Component Value Date   BUN 17 03/01/2020   CREATININE 0.89 03/01/2020   BCR 19 03/01/2020   GFRAA 72 03/01/2020   GFRNONAA 63 03/01/2020     Hepatic Lab Results  Component Value  Date   AST 17 03/01/2020   ALT 15 03/01/2020   ALBUMIN 4.1 03/01/2020   ALKPHOS 51 03/01/2020   LIPASE 41 11/20/2018     Electrolytes Lab Results  Component Value Date   NA 140 03/01/2020   K 4.4 03/01/2020   CL 103 03/01/2020   CALCIUM 9.4 03/01/2020   MG 2.0 08/08/2018     Bone Lab Results  Component Value Date   25OHVITD1 20 (L) 08/08/2018   25OHVITD2 <1.0 08/08/2018   25OHVITD3 20 08/08/2018     Inflammation (CRP: Acute Phase) (ESR: Chronic Phase) Lab Results  Component Value Date   CRP 17 (H) 08/08/2018   ESRSEDRATE 94 (H) 08/08/2018       Note: Above Lab results reviewed.   Imaging  MM 3D SCREEN BREAST BILATERAL CLINICAL DATA:  Screening.  EXAM: DIGITAL SCREENING BILATERAL MAMMOGRAM WITH TOMO AND CAD  COMPARISON:  Previous exam(s).  ACR Breast Density Category b: There are scattered areas of fibroglandular density.  FINDINGS: There are no findings suspicious for malignancy. Images were processed with CAD.  IMPRESSION: No mammographic evidence of malignancy. A result letter of this screening mammogram will be mailed directly to the patient.  RECOMMENDATION: Screening mammogram in one year. (Code:[id number]-B-01Y)  BI-RADS CATEGORY  1: Negative.  Electronically Signed   By: Everlean Alstrom M.D.   On: 01/09/2020 10:05  Assessment  The primary encounter diagnosis was Chronic knee pain (1ry area of Pain) (Bilateral) (L>R). Diagnoses of Chronic knee pain (Left) and Osteoarthritis of knee (Left) were also pertinent to this visit.  Plan of Care  Problem-specific:  No problem-specific Assessment & Plan notes found for this encounter.  Ms. KAROL SKARZYNSKI has a current medication list which includes the following long-term medication(s): aripiprazole, carvedilol, diclofenac sodium, duloxetine, esomeprazole, levothyroxine, losartan, and pregabalin.  Pharmacotherapy (Medications Ordered): No orders of the defined types were placed in this  encounter.  Orders:  Orders Placed This Encounter  Procedures  . KNEE INJECTION    Hyalgan knee injection. Please order Hyalgan.    Standing Status:   Future    Standing Expiration Date:   04/28/2020    Scheduling Instructions:     Procedure: Intra-articular Hyalgan Knee injection #1     Side: Left-sided     Sedation: None     Timeframe: ASAA    Order Specific Question:   Where will this procedure be performed?    Answer:   [facility]   Follow-up plan:   Return for Procedure (no sedation): (L) Hyalgan #1.      Interventional treatment options: Planned, scheduled, and/or pending:   No further increases on opioid analgesics    Under consideration:   Possible right Genicular nerve RFA  Diagnostic left Genicular NB Possibleleft Genicular nerve RFA Possible bilateral lumbar facet RFA Diagnostic left LESI Diagnostic caudal ESI +  epidurogram Possible Racz procedure Diagnostic bilateral cervical facet block Possible bilateral cervical facet RFA Diagnostic right versus left CESI Diagnostic bilateral IA shoulder joint injection Diagnostic bilateral suprascapular NB Possible bilateral suprascapular nerve RFA   Therapeutic/palliative (PRN):  Diagnostic right Genicular NB #2(90/90/50)  Diagnosticleft IA knee joint injection #2(100/100/90)  Palliativeleft IA Hyalgan Knee injection S2N1(last completed on 11/01/2018)  Diagnostic bilateral lumbar facet block #2      Recent Visits Date Type Provider Dept  03/07/20 Procedure visit Milinda Pointer, MD [facility]  02/26/20 Telemedicine Milinda Pointer, MD [facility]  Showing recent visits within past 90 days and meeting all other requirements Today's Visits Date Type Provider Dept  03/28/20 Telemedicine Milinda Pointer, MD [facility]  Showing today's visits and meeting all other requirements Future Appointments No visits were found meeting these conditions. Showing  future appointments within next 90 days and meeting all other requirements  I discussed the assessment and treatment plan with the patient. The patient was provided an opportunity to ask questions and all were answered. The patient agreed with the plan and demonstrated an understanding of the instructions.  Patient advised to call back or seek an in-person evaluation if the symptoms or condition worsens.  Duration of encounter: 15 minutes.  Note by: Gaspar Cola, MD Date: 03/28/2020; Time: 8:43 AM

## 2020-03-29 ENCOUNTER — Telehealth: Payer: Self-pay | Admitting: General Practice

## 2020-03-29 ENCOUNTER — Ambulatory Visit (INDEPENDENT_AMBULATORY_CARE_PROVIDER_SITE_OTHER): Payer: Medicare HMO | Admitting: General Practice

## 2020-03-29 DIAGNOSIS — F3342 Major depressive disorder, recurrent, in full remission: Secondary | ICD-10-CM

## 2020-03-29 DIAGNOSIS — I10 Essential (primary) hypertension: Secondary | ICD-10-CM | POA: Diagnosis not present

## 2020-03-29 DIAGNOSIS — F419 Anxiety disorder, unspecified: Secondary | ICD-10-CM

## 2020-03-29 DIAGNOSIS — G894 Chronic pain syndrome: Secondary | ICD-10-CM

## 2020-03-29 NOTE — Chronic Care Management (AMB) (Signed)
Chronic Care Management   Follow Up Note   03/29/2020 Name: TIMMYA DOSER MRN: [id number] DOB: 10-17-1940  Referred by: [name], PA-C Reason for referral : Chronic Care Management (RNCM: Chronic Disease Managment and Care Coordination Needs)   WALDINE SHEEN is a 79 y.o. year old female who is a primary care patient of [name], PA-C. The CCM team was consulted for assistance with chronic disease management and care coordination needs.    Review of patient status, including review of consultants reports, relevant laboratory and other test results, and collaboration with appropriate care team members and the patient's provider was performed as part of comprehensive patient evaluation and provision of chronic care management services.    SDOH (Social Determinants of Health) assessments performed: Yes See Care Plan activities for detailed interventions related to [facility])     Outpatient Encounter Medications as of 03/29/2020  Medication Sig   ALPRAZolam (XANAX) 0.25 MG tablet Take 1 tablet (0.25 mg total) by mouth 2 (two) times daily as needed. for anxiety   ARIPiprazole (ABILIFY) 5 MG tablet Take 1 tablet (5 mg total) by mouth daily.   b complex vitamins tablet Take 1 tablet by mouth daily.   carvedilol (COREG) 6.25 MG tablet Take 1 tablet (6.25 mg total) by mouth 2 (two) times daily with a meal.   cyclobenzaprine (FLEXERIL) 5 MG tablet TAKE 1/2 TAB BY MOUTH 3 TIMES DAILY AS NEEDED FOR MUSCLE SPASMS   diclofenac Sodium (VOLTAREN) 1 % GEL Apply 2 g topically 4 (four) times daily.   DULoxetine (CYMBALTA) 60 MG capsule TAKE 1 CAPSULE BY MOUTH EVERY DAY   esomeprazole (NEXIUM) 40 MG capsule Take 40 mg by mouth daily at 12 noon.   levothyroxine (SYNTHROID) 50 MCG tablet Take 1 tablet (50 mcg total) by mouth daily.   losartan (COZAAR) 100 MG tablet TAKE 1 TABLET BY MOUTH EVERY DAY   nystatin (MYCOSTATIN/NYSTOP) powder APPLY TO AFFECTED AREA 3 TIMES  A DAY   pregabalin (LYRICA) 50 MG capsule Take 50 mg by mouth 3 (three) times daily.   UNABLE TO FIND Take by mouth as needed. Coriciden HBP   UNABLE TO FIND Take by mouth daily. CBD gummies   Vitamin D, Ergocalciferol, (DRISDOL) 1.25 MG (50000 UNIT) CAPS capsule Take 1 capsule (50,000 Units total) by mouth every 7 (seven) days.   No facility-administered encounter medications on file as of 03/29/2020.     Objective:   Goals Addressed              This Visit's Progress     COMPLETED: RNCM- Pt's daughter: "Mom is not being truthful about what is going on." (pt-stated)        CARE PLAN ENTRY (see longitudinal plan of care for additional care plan information)  Current Barriers: The patient is no longer living with her daughter.  She is back at home and her sons have been caring for her. She is doing very well at this time with no new falls or issues.   Knowledge Deficits related to fall precautions  Decreased adherence to prescribed treatment for fall prevention  Care Coordination needs related to caregiver strain and frequent falls in a patient with multiple falls/depression/anxiety/ chronic pain (disease states)  Lacks caregiver support.   Corporate treasurer.   Non-adherence to prescribed medication regimen  Clinical Goal(s):   Over the next 120 days, patient will demonstrate improved adherence to prescribed treatment plan for decreasing falls as evidenced by patient reporting and review  of EMR  Over the next 120 days, patient will verbalize using fall risk reduction strategies discussed  Over the next 120 days, patient will not experience additional falls  Over the next 120 days, patient will work with [facility], CCM team and pcp to address needs related to frequent falls and recommendations for the patient to remain safe in the home  Over the next 120 days, patient will demonstrate a decrease in fall exacerbations as evidenced by no new falls   Over the next 120  days, patient will demonstrate improved adherence to prescribed treatment plan for medication regimen and falls prevention as evidenced bycompliance in medication administration and no new falls  Over the next 120 days, patient will work with CM team pharmacist to reconcile medications, education and support, review of the patient taking CBD gummies the daughter is giving the patient to help promote rest at night  Over the next 120 days, patient will work with CM clinical social worker to help with caregiver strain and help with anxiety and depression in the patient with multiple chronic conditions.   Interventions:   Provided written and verbal education re: Potential causes of falls and Fall prevention strategies  Reviewed medications and discussed potential side effects of medications such as dizziness and frequent urination.  The patient's daughter is giving her 75mg  of lyrica BID instead of TID. She has also started a regimen of giving the patietn CBD gummies that she purchases from "[employer]" to help the patient with rest at night.   Assessed for s/s of orthostatic hypotension.  The patients daughter states she does have dizziness and she cautions the patient all the time to be careful. The patient has had falls x 3 in the last 30 days and EMS has had to come out to the home and get the patient up as the daughter is not able to do so. The patient called the office yesterday but did not offer this information to the pcp or the fact that she is having several incontinence episodes a day, but the patient does not see that is a problem.   Assessed for falls since last encounter. Last fall was 5/4 and EMS had to come out to the home. The daughter is so overwhelmed that she could not breath and had to call 911 on 5/5 to be seen at the hospital.  Assessed patients knowledge of fall risk prevention secondary to previously provided education. The daughter is aware of the severity of falls and is  concerned but states that the patient does not see this is a problem. The daughter is seeking help to help her mother.  Home health has not come out to work with the patient per the daughter.   Assessed new concern on 01-10-2020.  The daughter reached out to the [facility] and said she had talked to two people from the right at home program. [place] program [place] available. Also she would have to pay out of pocket and this is not possible. The daughter is wanting help in the home. She says that "Katie" with pallative says the patient needs PT/OT but they don't offer those services. They have not heard from PT/OT per the daughter. A referral for the care guides has been placed for help with resources in [place].   Assessed working status of life alert bracelet and patient adherence  Provided patient information for fall alert systems  Advised  patient to call the pcp for any new falls  Provided education to patient re: talking to the pcp and the team about falls and information provided today to see how the patient can be helped  Social Work referral for caregiver strain, depression and anxiety- support for the family to make decisions on the best way to meet the patients needs  Pharmacy referral for review of medications, help with questions and concerns and review of the CBD gummies the daughter is giving the patient at night. She gets these from [employer].   Patient Self Care Activities:   Utilize walker (assistive device) appropriately with all ambulation  De-clutter walkways  Change positions slowly  Wear secure fitting shoes at all times with ambulation  Utilize home lighting for dim lit areas  Have self and pet awareness at all times  Plan:  CCM RN CM will follow up in 30 to 60 days   Please see past updates related to this goal by clicking on the "Past Updates" button in the selected goal        RNCM:  Mom is having a really hard time managing her pain and discomfort        CARE PLAN ENTRY (see longtitudinal plan of care for additional care plan information)  Current Barriers:   Knowledge Deficits related to how to effectively manage pain as evidence by unresolved pain in patient with chronic pain syndrome, depression and anxiety  Unresolved grief, depression and anxiety as evidence of losing her spouse and having to move in with her daughter, recent death of her last living sibling on 10/29/2019  Nurse Case Manager Clinical Goal(s):   Over the next 120 days, patient will verbalize understanding of plan for managing pain effectively, having decreased episodes of depression and anxiety  Over the next 120 days, patient will work with [facility] and pcp to address needs related to pain relief measures and dealing with her depression and anxiety   Over the next 120 days, patient will demonstrate a decrease in pain exacerbations as evidenced by controlled pain  Over the next 120 days, patient will attend all scheduled medical appointments: Front office staff to make an appointment for the patient to follow up with pcp  Interventions:   Evaluation of current treatment plan related to pain control and patient's adherence to plan as established by provider. 03-29-2020: The patient reports her pain is much better. She says the right knee hardly hurts at all the left knee is at 75% better. Has been getting shots in her knees and can see a huge difference. The patient also is ambulating without her walker and doing well. Her son Kathlene November says since she has moved back home she is doing very well.   Provided education to patient re: if pain worsens or changes in level of intensity to call the MD.  Reviewed medications with patient and discussed compliance- 03-28-2020: The patient son states that since she has come back home and come off some of the medications she was on he has his "mama back". She is canning  green beans, walking without her walker, not drooling on herself.  She is happy and doing well. He says she is more coherent and not "out of it" like she was. He says bringing her back to her home that she had his dad shared has been very good for the patient. They take her out to breakfast if she wants to go and she goes out on the porch and sits  and does things she use to do without difficulty.   Evaluation of home health services. 03-29-2020: The patient has worked with PT/ST and has an aide that comes in and assist with basic needs. The patient per the son is more independent since she is back home from his sisters house. She came back to [facility] on 03-28-2023.     Reviewed scheduled/upcoming provider appointments including: next appointment with pcp on 04-01-2020.  Pharmacy consult for support and recommendations- completed.  The pharmacist is available for assistance as needed   Social worker support for effective management of grief and depression.  The patient is currently working with the LCSW   Patient Self Care Activities:   Patient verbalizes understanding of plan to work with the interdisciplinary team to help patient manage her pain, depression and anxiety more effectively  Self administers medications as prescribed  Attends all scheduled provider appointments  Calls provider office for new concerns or questions  Unable to independently manage pain, depression and anxiety related to chronic conditions  Unable to perform IADLs independently  Please see past updates related to this goal by clicking on the "Past Updates" button in the selected goal        RNCM: PT:"I am so happy to be back home" (pt-stated)        Current Barriers:   Chronic Disease Management support, education, and care coordination needs related to HTN, Anxiety, Depression, and Chronic Pain  Grief and mourning over the loss of her husband and having to move in with her daughter 11-24-2022 is her husbands  death anniversary). The patients last living sibling passed away on 11-28-19. 03/27/2020 the patient moved back to the home she and her husband shared and her 2 sons are taking care of her. They live next door.   Clinical Goal(s) related to HTN, Anxiety, Depression, and Chronic Pain :  Over the next 120 days, patient will:   Work with the care management team to address educational, disease management, and care coordination needs   Begin or continue self health monitoring activities as directed today Measure and record blood pressure 4 times per week  Call provider office for new or worsened signs and symptoms Blood pressure findings outside established parameters and New or worsened symptom related to Depression, Anxiety or Chronic Pain  Call care management team with questions or concerns  Verbalize basic understanding of patient centered plan of care established today  Interventions related to HTN, Anxiety, Depression, and Chronic Pain :   Evaluation of current treatment plans and patient's adherence to plan as established by provider.  The patient is doing well and being compliant with the plan of care.   Assessed patient understanding of disease states. 03-29-2020: Spoke with the patient and her son Kathlene November.  The patient has been back to her home since Mar 27, 2020 and has made significant improvement in her health and well being. The patient has not had any new falls. Is off of medications that are making her "out of it", has her pain under control, is cooking and canning green beans, and is walking without her walker. The patient expressed how happy she was to be back home.   Assessed patient's education and care coordination needs.  The patient denies any new concerns at this time. The patient states she is enjoying "porch sitting" since the weather has gotten nicer. 03-29-2020: The patient is walking out in the yard and filling up the bird feeders. Her son says "I have my mama  back".  Provided disease specific education to patient.  The patient reports a good appetite. The patient denies any unintentional weight loss. 03-29-2020: The patients son cooks for her a lot and he has finally gotten her to stop eating ice cream everyday. They have went with a sugar free option. He is working on helping the patient lose weight. He knows this will help with her pain in her knees as well. The patient has improved significantly per the son and positive things since the patient has moved back home.   Collaborated with appropriate clinical care team members regarding patient needs.  The patient continues to work with the CCM team and knows when to reach out for questions or concerns.   Advised the patient and the patient's son to take medications as prescribed. The son is managing her medications and said she had not been taking the xanax properly and that is why she was "out of it" all the time. The patient only takes it as needed and the son says she has not taken hardly any since being home.   Patient Self Care Activities related to HTN, Anxiety, Depression, and Chronic Pain :   Patient is unable to independently self-manage chronic health conditions  Please see past updates related to this goal by clicking on the "Past Updates" button in the selected goal          Plan:   Telephone follow up appointment with care management team member scheduled for: 05-10-2020 at 0930 am   Alto Denver RN, MSN, CCM Community Care Coordinator [place]   [facility] Mobile: [phone]

## 2020-03-29 NOTE — Patient Instructions (Signed)
Visit Information  Goals Addressed              This Visit's Progress   .  COMPLETED: RNCM- Pt's daughter: "Mom is not being truthful about what is going on." (pt-stated)        CARE PLAN ENTRY (see longitudinal plan of care for additional care plan information)  Current Barriers: The patient is no longer living with her daughter.  She is back at home and her sons have been caring for her. She is doing very well at this time with no new falls or issues.  . Knowledge Deficits related to fall precautions . Decreased adherence to prescribed treatment for fall prevention . Care Coordination needs related to caregiver strain and frequent falls in a patient with multiple falls/depression/anxiety/ chronic pain (disease states) . Lacks caregiver support.  . Corporate treasurer.  . Non-adherence to prescribed medication regimen  Clinical Goal(s):  [name] Over the next 120 days, patient will demonstrate improved adherence to prescribed treatment plan for decreasing falls as evidenced by patient reporting and review of EMR . Over the next 120 days, patient will verbalize using fall risk reduction strategies discussed . Over the next 120 days, patient will not experience additional falls . Over the next 120 days, patient will work with [facility], CCM team and pcp to address needs related to frequent falls and recommendations for the patient to remain safe in the home . Over the next 120 days, patient will demonstrate a decrease in fall exacerbations as evidenced by no new falls  . Over the next 120 days, patient will demonstrate improved adherence to prescribed treatment plan for medication regimen and falls prevention as evidenced bycompliance in medication administration and no new falls . Over the next 120 days, patient will work with CM team pharmacist to reconcile medications, education and support, review of the patient taking CBD gummies the daughter is giving the patient to help promote rest at  night . Over the next 120 days, patient will work with CM clinical social worker to help with caregiver strain and help with anxiety and depression in the patient with multiple chronic conditions.   Interventions:  . Provided written and verbal education re: Potential causes of falls and Fall prevention strategies . Reviewed medications and discussed potential side effects of medications such as dizziness and frequent urination.  The patient's daughter is giving her 75mg  of lyrica BID instead of TID. She has also started a regimen of giving the patietn CBD gummies that she purchases from "[employer]" to help the patient with rest at night.  . Assessed for s/s of orthostatic hypotension.  The patients daughter states she does have dizziness and she cautions the patient all the time to be careful. The patient has had falls x 3 in the last 30 days and EMS has had to come out to the home and get the patient up as the daughter is not able to do so. The patient called the office yesterday but did not offer this information to the pcp or the fact that she is having several incontinence episodes a day, but the patient does not see that is a problem.  . Assessed for falls since last encounter. Last fall was 5/4 and EMS had to come out to the home. The daughter is so overwhelmed that she could not breath and had to call 911 on 5/5 to be seen at the hospital. . Assessed patients knowledge of fall risk prevention secondary to previously provided education.  The daughter is aware of the severity of falls and is concerned but states that the patient does not see this is a problem. The daughter is seeking help to help her mother.  Home health has not come out to work with the patient per the daughter.  . Assessed new concern on 01-10-2020.  The daughter reached out to the [facility] and said she had talked to two people from the right at home program. [place]  program [place] available. Also she would have to pay out of pocket and this is not possible. The daughter is wanting help in the home. She says that "Katie" with pallative says the patient needs PT/OT but they don't offer those services. They have not heard from PT/OT per the daughter. A referral for the care guides has been placed for help with resources in [place].  . Assessed working status of life alert bracelet and patient adherence . Provided patient information for fall alert systems . Advised patient to call the pcp for any new falls . Provided education to patient re: talking to the pcp and the team about falls and information provided today to see how the patient can be helped . Social Work referral for caregiver strain, depression and anxiety- support for the family to make decisions on the best way to meet the patients needs . Pharmacy referral for review of medications, help with questions and concerns and review of the CBD gummies the daughter is giving the patient at night. She gets these from [employer].   Patient Self Care Activities:  . Utilize walker (assistive device) appropriately with all ambulation . De-clutter walkways . Change positions slowly . Wear secure fitting shoes at all times with ambulation . Utilize home lighting for dim lit areas . Have self and pet awareness at all times  Plan: . CCM RN CM will follow up in 30 to 60 days   Please see past updates related to this goal by clicking on the "Past Updates" button in the selected goal      .  RNCM: Mom is having a really hard time managing her pain and discomfort        CARE PLAN ENTRY (see longtitudinal plan of care for additional care plan information)  Current Barriers:  [name] Knowledge Deficits related to how to effectively manage pain as evidence by unresolved pain in patient with chronic pain syndrome, depression and anxiety . Unresolved grief, depression and anxiety as evidence of losing her  spouse and having to move in with her daughter, recent death of her last living sibling on 10/29/2019  Nurse Case Manager Clinical Goal(s):  [name] Over the next 120 days, patient will verbalize understanding of plan for managing pain effectively, having decreased episodes of depression and anxiety . Over the next 120 days, patient will work with [facility] and pcp to address needs related to pain relief measures and dealing with her depression and anxiety  . Over the next 120 days, patient will demonstrate a decrease in pain exacerbations as evidenced by controlled pain . Over the next 120 days, patient will attend all scheduled medical appointments: Front office staff to make an appointment for the patient to follow up with pcp  Interventions:  . Evaluation of current treatment plan related to pain control and patient's adherence to plan as established by provider. 03-29-2020: The patient reports her pain is much better. She says the right  knee hardly hurts at all the left knee is at 75% better. Has been getting shots in her knees and can see a huge difference. The patient also is ambulating without her walker and doing well. Her son Kathlene November says since she has moved back home she is doing very well.  . Provided education to patient re: if pain worsens or changes in level of intensity to call the MD. . Reviewed medications with patient and discussed compliance- 03-28-2020: The patient son states that since she has come back home and come off some of the medications she was on he has his "mama back". She is canning green beans, walking without her walker, not drooling on herself.  She is happy and doing well. He says she is more coherent and not "out of it" like she was. He says bringing her back to her home that she had his dad shared has been very good for the patient. They take her out to breakfast if she wants to go and she goes out on the porch and sits and does things she use to do without difficulty.  . Evaluation  of home health services. 03-29-2020: The patient has worked with PT/ST and has an aide that comes in and assist with basic needs. The patient per the son is more independent since she is back home from his sisters house. She came back to [facility] on 2023/03/08.  .   Reviewed scheduled/upcoming provider appointments including: next appointment with pcp on 04-01-2020. [name] Pharmacy consult for support and recommendations- completed.  The pharmacist is available for assistance as needed  . Social worker support for effective management of grief and depression.  The patient is currently working with the LCSW   Patient Self Care Activities:  . Patient verbalizes understanding of plan to work with the interdisciplinary team to help patient manage her pain, depression and anxiety more effectively . Self administers medications as prescribed . Attends all scheduled provider appointments . Calls provider office for new concerns or questions . Unable to independently manage pain, depression and anxiety related to chronic conditions . Unable to perform IADLs independently  Please see past updates related to this goal by clicking on the "Past Updates" button in the selected goal      .  RNCM: PT:"I am so happy to be back home" (pt-stated)        Current Barriers:  . Chronic Disease Management support, education, and care coordination needs related to HTN, Anxiety, Depression, and Chronic Pain . Grief and mourning over the loss of her husband and having to move in with her daughter 11/04/2022 is her husbands death anniversary). The patients last living sibling passed away on 11/08/2019. 03-07-20 the patient moved back to the home she and her husband shared and her 2 sons are taking care of her. They live next door.   Clinical Goal(s) related to HTN, Anxiety, Depression, and Chronic Pain :  Over the next 120 days, patient will:  . Work with the care management team to address educational, disease management, and  care coordination needs  . Begin or continue self health monitoring activities as directed today Measure and record blood pressure 4 times per week . Call provider office for new or worsened signs and symptoms Blood pressure findings outside established parameters and New or worsened symptom related to Depression, Anxiety or Chronic Pain . Call care management team with questions or concerns . Verbalize basic understanding of patient centered plan of care established today  Interventions related to HTN, Anxiety, Depression, and Chronic Pain :  . Evaluation of current treatment plans and patient's adherence to plan as established by provider.  The patient is doing well and being compliant with the plan of care.  . Assessed patient understanding of disease states. 03-29-2020: Spoke with the patient and her son Kathlene November.  The patient has been back to her home since 02/26/2020 and has made significant improvement in her health and well being. The patient has not had any new falls. Is off of medications that are making her "out of it", has her pain under control, is cooking and canning green beans, and is walking without her walker. The patient expressed how happy she was to be back home.  . Assessed patient's education and care coordination needs.  The patient denies any new concerns at this time. The patient states she is enjoying "porch sitting" since the weather has gotten nicer. 03-29-2020: The patient is walking out in the yard and filling up the bird feeders. Her son says "I have my mama back". . Provided disease specific education to patient.  The patient reports a good appetite. The patient denies any unintentional weight loss. 03-29-2020: The patients son cooks for her a lot and he has finally gotten her to stop eating ice cream everyday. They have went with a sugar free option. He is working on helping the patient lose weight. He knows this will help with her pain in her knees as well. The patient has  improved significantly per the son and positive things since the patient has moved back home.  Steele Sizer with appropriate clinical care team members regarding patient needs.  The patient continues to work with the CCM team and knows when to reach out for questions or concerns.  . Advised the patient and the patient's son to take medications as prescribed. The son is managing her medications and said she had not been taking the xanax properly and that is why she was "out of it" all the time. The patient only takes it as needed and the son says she has not taken hardly any since being home.   Patient Self Care Activities related to HTN, Anxiety, Depression, and Chronic Pain :  . Patient is unable to independently self-manage chronic health conditions  Please see past updates related to this goal by clicking on the "Past Updates" button in the selected goal         Patient verbalizes understanding of instructions provided today.   Telephone follow up appointment with care management team member scheduled for: 03-29-2020 0930 am  Alto Denver RN, MSN, CCM Community Care Coordinator [place]  [facility] Mobile: [phone]

## 2020-04-01 ENCOUNTER — Encounter: Payer: Self-pay | Admitting: Family Medicine

## 2020-04-01 ENCOUNTER — Telehealth (INDEPENDENT_AMBULATORY_CARE_PROVIDER_SITE_OTHER): Payer: Medicare HMO | Admitting: Family Medicine

## 2020-04-01 VITALS — BP 157/74 | HR 63

## 2020-04-01 DIAGNOSIS — F419 Anxiety disorder, unspecified: Secondary | ICD-10-CM

## 2020-04-01 DIAGNOSIS — M792 Neuralgia and neuritis, unspecified: Secondary | ICD-10-CM | POA: Diagnosis not present

## 2020-04-01 DIAGNOSIS — R600 Localized edema: Secondary | ICD-10-CM | POA: Diagnosis not present

## 2020-04-01 MED ORDER — HYDROCHLOROTHIAZIDE 12.5 MG PO TABS
12.5000 mg | ORAL_TABLET | Freq: Every day | ORAL | 0 refills | Status: DC
Start: 2020-04-01 — End: 2020-05-29

## 2020-04-01 NOTE — Progress Notes (Signed)
BP (!) 157/74   Pulse 63    Subjective:    Patient ID: Cynthia Jones, female    DOB: 1941/05/23, 79 y.o.   MRN: [id number]  HPI: Cynthia Jones is a 79 y.o. female  Chief Complaint  Patient presents with  . Pain  . Edema    bilateral feet x a month now    . This visit was completed via telephone due to the restrictions of the COVID-19 pandemic. All issues as above were discussed and addressed. Physical exam was done as above through visual confirmation on telephone. If it was felt that the patient should be evaluated in the office, they were directed there. The patient verbally consented to this visit. . Location of the patient: home . Location of the provider: work . Those involved with this call:  . Provider: Roosvelt Maser, PA-C . CMA: Elton Sin, CMA . Front Desk/Registration: Adela Ports  . Time spent on call: 15 minutes on the phone discussing health concerns. 5 minutes total spent in review of patient's record and preparation of their chart.   B/l lower leg and foot edema the past few weeks. This has been an issue in the past as well for her. Denies discoloration, significant pain in feet, recent injuries. Has not been using compression stockings or elevating feet. Trying to increase activity.   No longer on the xanax, has not needed it since moving back home with her son. Son states they keep it around if needed but that she's doing very well staying off of it.   Only taking about 1/2 a muscle relaxer daily now, doing so much better lately. Started CBD supplements in addition to her cymbalta, lyrica regimen and doing so well. Canning vegetables, performing ADLs nearly independently.   Depression screen [facility] 2/9 12/08/2019 11/15/2019 10/16/2019  Decreased Interest 0 0 2  Down, Depressed, Hopeless 1 0 2  PHQ - 2 Score 1 0 4  Altered sleeping - 0 3  Tired, decreased energy - 1 3  Change in appetite - 0 2  Feeling bad or failure about yourself  - 0 1  Trouble  concentrating - 0 2  Moving slowly or fidgety/restless - 0 3  Suicidal thoughts - 0 0  PHQ-9 Score - 1 18  Difficult doing work/chores - - Somewhat difficult  Some recent data might be hidden   GAD 7 : Generalized Anxiety Score 11/15/2019 10/16/2019 06/16/2019  Nervous, Anxious, on Edge 0 3 2  Control/stop worrying 1 3 1   Worry too much - different things 1 3 2   Trouble relaxing 0 3 2  Restless 0 3 0  Easily annoyed or irritable 0 3 1  Afraid - awful might happen 0 2 0  Total GAD 7 Score 2 20 8   Anxiety Difficulty - Very difficult Somewhat difficult     Relevant past medical, surgical, family and social history reviewed and updated as indicated. Interim medical history since our last visit reviewed. Allergies and medications reviewed and updated.  Review of Systems  Per HPI unless specifically indicated above     Objective:    BP (!) 157/74   Pulse 63   Wt Readings from Last 3 Encounters:  03/07/20 231 lb (104.8 kg)  03/01/20 240 lb (108.9 kg)  11/15/19 222 lb (100.7 kg)    Physical Exam  Results for orders placed or performed in visit on 03/01/20  Comprehensive metabolic panel  Result Value Ref Range   Glucose 106 (H) 65 -  99 mg/dL   BUN 17 8 - 27 mg/dL   Creatinine, Ser 2.75 0.57 - 1.00 mg/dL   GFR calc non Af Amer 63 >59 mL/min/1.73   GFR calc Af Amer 72 >59 mL/min/1.73   BUN/Creatinine Ratio 19 12 - 28   Sodium 140 134 - 144 mmol/L   Potassium 4.4 3.5 - 5.2 mmol/L   Chloride 103 96 - 106 mmol/L   CO2 21 20 - 29 mmol/L   Calcium 9.4 8.7 - 10.3 mg/dL   Total Protein 7.2 6.0 - 8.5 g/dL   Albumin 4.1 3.7 - 4.7 g/dL   Globulin, Total 3.1 1.5 - 4.5 g/dL   Albumin/Globulin Ratio 1.3 1.2 - 2.2   Bilirubin Total 0.2 0.0 - 1.2 mg/dL   Alkaline Phosphatase 51 48 - 121 IU/L   AST 17 0 - 40 IU/L   ALT 15 0 - 32 IU/L  Lipid Panel w/o Chol/HDL Ratio  Result Value Ref Range   Cholesterol, Total 208 (H) 100 - 199 mg/dL   Triglycerides 170 0 - 149 mg/dL   HDL 70 >01  mg/dL   VLDL Cholesterol Cal 19 5 - 40 mg/dL   LDL Chol Calc (NIH) 749 (H) 0 - 99 mg/dL  TSH  Result Value Ref Range   TSH 2.630 0.450 - 4.500 uIU/mL      Assessment & Plan:   Problem List Items Addressed This Visit      Other   Neurogenic pain (Chronic)    Significantly improved at this time, continue current regimen. Back in with Pain clinic as well for injection therapy, continue off opioids      Anxiety    Much improved with change in living situation, continue off xanax and remainder of regimen       Other Visit Diagnoses    Lower leg edema    -  Primary   Start low dose HCTZ, compression and elevation as tolerated, DASH diet. Monitor BPs closely. F/u for bmp and BP recheck        Follow up plan: Return in about 3 months (around 07/02/2020) for Edema, bp f/u with bmp.

## 2020-04-02 DIAGNOSIS — M797 Fibromyalgia: Secondary | ICD-10-CM | POA: Diagnosis not present

## 2020-04-02 DIAGNOSIS — M8949 Other hypertrophic osteoarthropathy, multiple sites: Secondary | ICD-10-CM | POA: Diagnosis not present

## 2020-04-02 DIAGNOSIS — F329 Major depressive disorder, single episode, unspecified: Secondary | ICD-10-CM | POA: Diagnosis not present

## 2020-04-02 DIAGNOSIS — E1140 Type 2 diabetes mellitus with diabetic neuropathy, unspecified: Secondary | ICD-10-CM | POA: Diagnosis not present

## 2020-04-02 DIAGNOSIS — M1712 Unilateral primary osteoarthritis, left knee: Secondary | ICD-10-CM | POA: Diagnosis not present

## 2020-04-02 DIAGNOSIS — F419 Anxiety disorder, unspecified: Secondary | ICD-10-CM | POA: Diagnosis not present

## 2020-04-02 DIAGNOSIS — G8929 Other chronic pain: Secondary | ICD-10-CM | POA: Diagnosis not present

## 2020-04-02 DIAGNOSIS — M5481 Occipital neuralgia: Secondary | ICD-10-CM | POA: Diagnosis not present

## 2020-04-02 DIAGNOSIS — R1310 Dysphagia, unspecified: Secondary | ICD-10-CM | POA: Diagnosis not present

## 2020-04-04 ENCOUNTER — Telehealth: Payer: Self-pay | Admitting: Family Medicine

## 2020-04-04 DIAGNOSIS — M5481 Occipital neuralgia: Secondary | ICD-10-CM | POA: Diagnosis not present

## 2020-04-04 DIAGNOSIS — M797 Fibromyalgia: Secondary | ICD-10-CM | POA: Diagnosis not present

## 2020-04-04 DIAGNOSIS — M8949 Other hypertrophic osteoarthropathy, multiple sites: Secondary | ICD-10-CM | POA: Diagnosis not present

## 2020-04-04 DIAGNOSIS — G8929 Other chronic pain: Secondary | ICD-10-CM | POA: Diagnosis not present

## 2020-04-04 DIAGNOSIS — F329 Major depressive disorder, single episode, unspecified: Secondary | ICD-10-CM | POA: Diagnosis not present

## 2020-04-04 DIAGNOSIS — M1712 Unilateral primary osteoarthritis, left knee: Secondary | ICD-10-CM | POA: Diagnosis not present

## 2020-04-04 DIAGNOSIS — R1310 Dysphagia, unspecified: Secondary | ICD-10-CM | POA: Diagnosis not present

## 2020-04-04 DIAGNOSIS — F419 Anxiety disorder, unspecified: Secondary | ICD-10-CM | POA: Diagnosis not present

## 2020-04-04 DIAGNOSIS — E1140 Type 2 diabetes mellitus with diabetic neuropathy, unspecified: Secondary | ICD-10-CM | POA: Diagnosis not present

## 2020-04-04 NOTE — Telephone Encounter (Signed)
-----   Message from [name], [place] sent at 04/04/2020  6:51 AM EDT ----- 3 month leg edema, BP f/u

## 2020-04-04 NOTE — Assessment & Plan Note (Signed)
Much improved with change in living situation, continue off xanax and remainder of regimen

## 2020-04-04 NOTE — Assessment & Plan Note (Signed)
Significantly improved at this time, continue current regimen. Back in with Pain clinic as well for injection therapy, continue off opioids

## 2020-04-08 DIAGNOSIS — M797 Fibromyalgia: Secondary | ICD-10-CM | POA: Diagnosis not present

## 2020-04-08 DIAGNOSIS — G8929 Other chronic pain: Secondary | ICD-10-CM | POA: Diagnosis not present

## 2020-04-08 DIAGNOSIS — M1712 Unilateral primary osteoarthritis, left knee: Secondary | ICD-10-CM | POA: Diagnosis not present

## 2020-04-08 DIAGNOSIS — M5481 Occipital neuralgia: Secondary | ICD-10-CM | POA: Diagnosis not present

## 2020-04-08 DIAGNOSIS — F419 Anxiety disorder, unspecified: Secondary | ICD-10-CM | POA: Diagnosis not present

## 2020-04-08 DIAGNOSIS — E1140 Type 2 diabetes mellitus with diabetic neuropathy, unspecified: Secondary | ICD-10-CM | POA: Diagnosis not present

## 2020-04-08 DIAGNOSIS — R1310 Dysphagia, unspecified: Secondary | ICD-10-CM | POA: Diagnosis not present

## 2020-04-08 DIAGNOSIS — M8949 Other hypertrophic osteoarthropathy, multiple sites: Secondary | ICD-10-CM | POA: Diagnosis not present

## 2020-04-08 DIAGNOSIS — F329 Major depressive disorder, single episode, unspecified: Secondary | ICD-10-CM | POA: Diagnosis not present

## 2020-04-09 ENCOUNTER — Other Ambulatory Visit: Payer: Self-pay | Admitting: Nurse Practitioner

## 2020-04-09 ENCOUNTER — Other Ambulatory Visit: Payer: Self-pay | Admitting: Family Medicine

## 2020-04-09 DIAGNOSIS — E1140 Type 2 diabetes mellitus with diabetic neuropathy, unspecified: Secondary | ICD-10-CM | POA: Diagnosis not present

## 2020-04-09 DIAGNOSIS — I10 Essential (primary) hypertension: Secondary | ICD-10-CM

## 2020-04-09 DIAGNOSIS — M5481 Occipital neuralgia: Secondary | ICD-10-CM | POA: Diagnosis not present

## 2020-04-09 DIAGNOSIS — R1310 Dysphagia, unspecified: Secondary | ICD-10-CM | POA: Diagnosis not present

## 2020-04-09 DIAGNOSIS — M8949 Other hypertrophic osteoarthropathy, multiple sites: Secondary | ICD-10-CM | POA: Diagnosis not present

## 2020-04-09 DIAGNOSIS — M797 Fibromyalgia: Secondary | ICD-10-CM | POA: Diagnosis not present

## 2020-04-09 DIAGNOSIS — F419 Anxiety disorder, unspecified: Secondary | ICD-10-CM | POA: Diagnosis not present

## 2020-04-09 DIAGNOSIS — F329 Major depressive disorder, single episode, unspecified: Secondary | ICD-10-CM | POA: Diagnosis not present

## 2020-04-09 DIAGNOSIS — M1712 Unilateral primary osteoarthritis, left knee: Secondary | ICD-10-CM | POA: Diagnosis not present

## 2020-04-09 DIAGNOSIS — G8929 Other chronic pain: Secondary | ICD-10-CM | POA: Diagnosis not present

## 2020-04-09 NOTE — Telephone Encounter (Signed)
Requested medication (s) are due for refill today: yes  Requested medication (s) are on the active medication list: yes  Last refill:  03/01/20 #45 1 refill  Future visit scheduled: yes  Notes to clinic:  not delegated per protocol     Requested Prescriptions  Pending Prescriptions Disp Refills   cyclobenzaprine (FLEXERIL) 5 MG tablet [Pharmacy Med Name: CYCLOBENZAPRINE 5 MG TABLET] 45 tablet 1    Sig: TAKE 1/2 TABLET (2.5 MG TOTAL) BY MOUTH 3 (THREE) TIMES DAILY AS NEEDED FOR MUSCLE SPASMS.      Not Delegated - Analgesics:  Muscle Relaxants Failed - 04/09/2020  5:26 PM      Failed - This refill cannot be delegated      Passed - Valid encounter within last 6 months    Recent Outpatient Visits           1 week ago Lower leg edema   [facility] [name], [place]   1 month ago Essential hypertension   [facility] Roosvelt Maser [place], [place]   2 months ago Osteoarthritis of knee (Left)   [facility] [name], [place]   3 months ago Fibromyalgia   [facility] Roosvelt Maser [place], [place]   4 months ago Acute non-recurrent maxillary sinusitis   [facility] [name], [place]       Future Appointments             In 1 week  [facility]   In 3 months Laural Benes, Oralia Rud, DO [facility]

## 2020-04-10 NOTE — Telephone Encounter (Signed)
Pt verbalized And confirmed understanding.

## 2020-04-11 ENCOUNTER — Other Ambulatory Visit: Payer: Self-pay | Admitting: Nurse Practitioner

## 2020-04-11 DIAGNOSIS — I10 Essential (primary) hypertension: Secondary | ICD-10-CM

## 2020-04-11 DIAGNOSIS — G8929 Other chronic pain: Secondary | ICD-10-CM | POA: Diagnosis not present

## 2020-04-11 DIAGNOSIS — F419 Anxiety disorder, unspecified: Secondary | ICD-10-CM | POA: Diagnosis not present

## 2020-04-11 DIAGNOSIS — M797 Fibromyalgia: Secondary | ICD-10-CM | POA: Diagnosis not present

## 2020-04-11 DIAGNOSIS — M5481 Occipital neuralgia: Secondary | ICD-10-CM | POA: Diagnosis not present

## 2020-04-11 DIAGNOSIS — E1140 Type 2 diabetes mellitus with diabetic neuropathy, unspecified: Secondary | ICD-10-CM | POA: Diagnosis not present

## 2020-04-11 DIAGNOSIS — M8949 Other hypertrophic osteoarthropathy, multiple sites: Secondary | ICD-10-CM | POA: Diagnosis not present

## 2020-04-11 DIAGNOSIS — F3342 Major depressive disorder, recurrent, in full remission: Secondary | ICD-10-CM

## 2020-04-11 DIAGNOSIS — R1310 Dysphagia, unspecified: Secondary | ICD-10-CM | POA: Diagnosis not present

## 2020-04-11 DIAGNOSIS — F329 Major depressive disorder, single episode, unspecified: Secondary | ICD-10-CM | POA: Diagnosis not present

## 2020-04-11 DIAGNOSIS — M1712 Unilateral primary osteoarthritis, left knee: Secondary | ICD-10-CM | POA: Diagnosis not present

## 2020-04-12 DIAGNOSIS — E1140 Type 2 diabetes mellitus with diabetic neuropathy, unspecified: Secondary | ICD-10-CM | POA: Diagnosis not present

## 2020-04-12 DIAGNOSIS — M797 Fibromyalgia: Secondary | ICD-10-CM | POA: Diagnosis not present

## 2020-04-12 DIAGNOSIS — F419 Anxiety disorder, unspecified: Secondary | ICD-10-CM | POA: Diagnosis not present

## 2020-04-12 DIAGNOSIS — M5481 Occipital neuralgia: Secondary | ICD-10-CM | POA: Diagnosis not present

## 2020-04-12 DIAGNOSIS — G8929 Other chronic pain: Secondary | ICD-10-CM | POA: Diagnosis not present

## 2020-04-12 DIAGNOSIS — R1310 Dysphagia, unspecified: Secondary | ICD-10-CM | POA: Diagnosis not present

## 2020-04-12 DIAGNOSIS — M1712 Unilateral primary osteoarthritis, left knee: Secondary | ICD-10-CM | POA: Diagnosis not present

## 2020-04-12 DIAGNOSIS — F329 Major depressive disorder, single episode, unspecified: Secondary | ICD-10-CM | POA: Diagnosis not present

## 2020-04-12 DIAGNOSIS — M8949 Other hypertrophic osteoarthropathy, multiple sites: Secondary | ICD-10-CM | POA: Diagnosis not present

## 2020-04-15 DIAGNOSIS — E1140 Type 2 diabetes mellitus with diabetic neuropathy, unspecified: Secondary | ICD-10-CM | POA: Diagnosis not present

## 2020-04-15 DIAGNOSIS — M797 Fibromyalgia: Secondary | ICD-10-CM | POA: Diagnosis not present

## 2020-04-15 DIAGNOSIS — R1310 Dysphagia, unspecified: Secondary | ICD-10-CM | POA: Diagnosis not present

## 2020-04-15 DIAGNOSIS — M8949 Other hypertrophic osteoarthropathy, multiple sites: Secondary | ICD-10-CM | POA: Diagnosis not present

## 2020-04-15 DIAGNOSIS — F329 Major depressive disorder, single episode, unspecified: Secondary | ICD-10-CM | POA: Diagnosis not present

## 2020-04-15 DIAGNOSIS — F419 Anxiety disorder, unspecified: Secondary | ICD-10-CM | POA: Diagnosis not present

## 2020-04-15 DIAGNOSIS — M1712 Unilateral primary osteoarthritis, left knee: Secondary | ICD-10-CM | POA: Diagnosis not present

## 2020-04-15 DIAGNOSIS — G8929 Other chronic pain: Secondary | ICD-10-CM | POA: Diagnosis not present

## 2020-04-15 DIAGNOSIS — M5481 Occipital neuralgia: Secondary | ICD-10-CM | POA: Diagnosis not present

## 2020-04-16 DIAGNOSIS — F329 Major depressive disorder, single episode, unspecified: Secondary | ICD-10-CM | POA: Diagnosis not present

## 2020-04-16 DIAGNOSIS — M5481 Occipital neuralgia: Secondary | ICD-10-CM | POA: Diagnosis not present

## 2020-04-16 DIAGNOSIS — M797 Fibromyalgia: Secondary | ICD-10-CM | POA: Diagnosis not present

## 2020-04-16 DIAGNOSIS — M8949 Other hypertrophic osteoarthropathy, multiple sites: Secondary | ICD-10-CM | POA: Diagnosis not present

## 2020-04-16 DIAGNOSIS — F419 Anxiety disorder, unspecified: Secondary | ICD-10-CM | POA: Diagnosis not present

## 2020-04-16 DIAGNOSIS — R1310 Dysphagia, unspecified: Secondary | ICD-10-CM | POA: Diagnosis not present

## 2020-04-16 DIAGNOSIS — G8929 Other chronic pain: Secondary | ICD-10-CM | POA: Diagnosis not present

## 2020-04-16 DIAGNOSIS — M1712 Unilateral primary osteoarthritis, left knee: Secondary | ICD-10-CM | POA: Diagnosis not present

## 2020-04-16 DIAGNOSIS — E1140 Type 2 diabetes mellitus with diabetic neuropathy, unspecified: Secondary | ICD-10-CM | POA: Diagnosis not present

## 2020-04-18 DIAGNOSIS — R1310 Dysphagia, unspecified: Secondary | ICD-10-CM | POA: Diagnosis not present

## 2020-04-18 DIAGNOSIS — M797 Fibromyalgia: Secondary | ICD-10-CM | POA: Diagnosis not present

## 2020-04-18 DIAGNOSIS — M1712 Unilateral primary osteoarthritis, left knee: Secondary | ICD-10-CM | POA: Diagnosis not present

## 2020-04-18 DIAGNOSIS — F329 Major depressive disorder, single episode, unspecified: Secondary | ICD-10-CM | POA: Diagnosis not present

## 2020-04-18 DIAGNOSIS — M8949 Other hypertrophic osteoarthropathy, multiple sites: Secondary | ICD-10-CM | POA: Diagnosis not present

## 2020-04-18 DIAGNOSIS — G8929 Other chronic pain: Secondary | ICD-10-CM | POA: Diagnosis not present

## 2020-04-18 DIAGNOSIS — M5481 Occipital neuralgia: Secondary | ICD-10-CM | POA: Diagnosis not present

## 2020-04-18 DIAGNOSIS — F419 Anxiety disorder, unspecified: Secondary | ICD-10-CM | POA: Diagnosis not present

## 2020-04-18 DIAGNOSIS — E1140 Type 2 diabetes mellitus with diabetic neuropathy, unspecified: Secondary | ICD-10-CM | POA: Diagnosis not present

## 2020-04-19 ENCOUNTER — Ambulatory Visit (INDEPENDENT_AMBULATORY_CARE_PROVIDER_SITE_OTHER): Payer: Medicare HMO

## 2020-04-19 VITALS — Ht 60.0 in | Wt 220.0 lb

## 2020-04-19 DIAGNOSIS — Z0000 Encounter for general adult medical examination without abnormal findings: Secondary | ICD-10-CM | POA: Diagnosis not present

## 2020-04-19 NOTE — Patient Instructions (Signed)
Cynthia Jones , Thank you for taking time to come for your Medicare Wellness Visit. I appreciate your ongoing commitment to your health goals. Please review the following plan we discussed and let me know if I can assist you in the future.   Screening recommendations/referrals: Colonoscopy: not required Mammogram: completed 01/08/2020 Bone Density: completed 03/26/2014 Recommended yearly ophthalmology/optometry visit for glaucoma screening and checkup Recommended yearly dental visit for hygiene and checkup  Vaccinations: Influenza vaccine: due Pneumococcal vaccine: completed 03/05/2014 Tdap vaccine: completed 04/07/2018 Shingles vaccine: discussed   Covid-19: 11/24/2019, 11/10/2019  Advanced directives: copy in chart  Conditions/risks identified: none  Next appointment: Follow up in one year for your annual wellness visit    Preventive Care 65 Years and Older, Female Preventive care refers to lifestyle choices and visits with your health care provider that can promote health and wellness. What does preventive care include?  A yearly physical exam. This is also called an annual well check.  Dental exams once or twice a year.  Routine eye exams. Ask your health care provider how often you should have your eyes checked.  Personal lifestyle choices, including:  Daily care of your teeth and gums.  Regular physical activity.  Eating a healthy diet.  Avoiding tobacco and drug use.  Limiting alcohol use.  Practicing safe sex.  Taking low-dose aspirin every day.  Taking vitamin and mineral supplements as recommended by your health care provider. What happens during an annual well check? The services and screenings done by your health care provider during your annual well check will depend on your age, overall health, lifestyle risk factors, and family history of disease. Counseling  Your health care provider may ask you questions about your:  Alcohol use.  Tobacco  use.  Drug use.  Emotional well-being.  Home and relationship well-being.  Sexual activity.  Eating habits.  History of falls.  Memory and ability to understand (cognition).  Work and work Astronomer.  Reproductive health. Screening  You may have the following tests or measurements:  Height, weight, and BMI.  Blood pressure.  Lipid and cholesterol levels. These may be checked every 5 years, or more frequently if you are over 32 years old.  Skin check.  Lung cancer screening. You may have this screening every year starting at age 57 if you have a 30-pack-year history of smoking and currently smoke or have quit within the past 15 years.  Fecal occult blood test (FOBT) of the stool. You may have this test every year starting at age 70.  Flexible sigmoidoscopy or colonoscopy. You may have a sigmoidoscopy every 5 years or a colonoscopy every 10 years starting at age 55.  Hepatitis C blood test.  Hepatitis B blood test.  Sexually transmitted disease (STD) testing.  Diabetes screening. This is done by checking your blood sugar (glucose) after you have not eaten for a while (fasting). You may have this done every 1-3 years.  Bone density scan. This is done to screen for osteoporosis. You may have this done starting at age 68.  Mammogram. This may be done every 1-2 years. Talk to your health care provider about how often you should have regular mammograms. Talk with your health care provider about your test results, treatment options, and if necessary, the need for more tests. Vaccines  Your health care provider may recommend certain vaccines, such as:  Influenza vaccine. This is recommended every year.  Tetanus, diphtheria, and acellular pertussis (Tdap, Td) vaccine. You may need a Td booster  every 10 years.  Zoster vaccine. You may need this after age 54.  Pneumococcal 13-valent conjugate (PCV13) vaccine. One dose is recommended after age 63.  Pneumococcal  polysaccharide (PPSV23) vaccine. One dose is recommended after age 76. Talk to your health care provider about which screenings and vaccines you need and how often you need them. This information is not intended to replace advice given to you by your health care provider. Make sure you discuss any questions you have with your health care provider. Document Released: 09/13/2015 Document Revised: 05/06/2016 Document Reviewed: 06/18/2015 Elsevier Interactive Patient Education  2017 [place] Prevention in the Home Falls can cause injuries. They can happen to people of all ages. There are many things you can do to make your home safe and to help prevent falls. What can I do on the outside of my home?  Regularly fix the edges of walkways and driveways and fix any cracks.  Remove anything that might make you trip as you walk through a door, such as a raised step or threshold.  Trim any bushes or trees on the path to your home.  Use bright outdoor lighting.  Clear any walking paths of anything that might make someone trip, such as rocks or tools.  Regularly check to see if handrails are loose or broken. Make sure that both sides of any steps have handrails.  Any raised decks and porches should have guardrails on the edges.  Have any leaves, snow, or ice cleared regularly.  Use sand or salt on walking paths during winter.  Clean up any spills in your garage right away. This includes oil or grease spills. What can I do in the bathroom?  Use night lights.  Install grab bars by the toilet and in the tub and shower. Do not use towel bars as grab bars.  Use non-skid mats or decals in the tub or shower.  If you need to sit down in the shower, use a plastic, non-slip stool.  Keep the floor dry. Clean up any water that spills on the floor as soon as it happens.  Remove soap buildup in the tub or shower regularly.  Attach bath mats securely with double-sided non-slip rug  tape.  Do not have throw rugs and other things on the floor that can make you trip. What can I do in the bedroom?  Use night lights.  Make sure that you have a light by your bed that is easy to reach.  Do not use any sheets or blankets that are too big for your bed. They should not hang down onto the floor.  Have a firm chair that has side arms. You can use this for support while you get dressed.  Do not have throw rugs and other things on the floor that can make you trip. What can I do in the kitchen?  Clean up any spills right away.  Avoid walking on wet floors.  Keep items that you use a lot in easy-to-reach places.  If you need to reach something above you, use a strong step stool that has a grab bar.  Keep electrical cords out of the way.  Do not use floor polish or wax that makes floors slippery. If you must use wax, use non-skid floor wax.  Do not have throw rugs and other things on the floor that can make you trip. What can I do with my stairs?  Do not leave any items on the stairs.  Make  sure that there are handrails on both sides of the stairs and use them. Fix handrails that are broken or loose. Make sure that handrails are as long as the stairways.  Check any carpeting to make sure that it is firmly attached to the stairs. Fix any carpet that is loose or worn.  Avoid having throw rugs at the top or bottom of the stairs. If you do have throw rugs, attach them to the floor with carpet tape.  Make sure that you have a light switch at the top of the stairs and the bottom of the stairs. If you do not have them, ask someone to add them for you. What else can I do to help prevent falls?  Wear shoes that:  Do not have high heels.  Have rubber bottoms.  Are comfortable and fit you well.  Are closed at the toe. Do not wear sandals.  If you use a stepladder:  Make sure that it is fully opened. Do not climb a closed stepladder.  Make sure that both sides of the  stepladder are locked into place.  Ask someone to hold it for you, if possible.  Clearly mark and make sure that you can see:  Any grab bars or handrails.  First and last steps.  Where the edge of each step is.  Use tools that help you move around (mobility aids) if they are needed. These include:  Canes.  Walkers.  Scooters.  Crutches.  Turn on the lights when you go into a dark area. Replace any light bulbs as soon as they burn out.  Set up your furniture so you have a clear path. Avoid moving your furniture around.  If any of your floors are uneven, fix them.  If there are any pets around you, be aware of where they are.  Review your medicines with your doctor. Some medicines can make you feel dizzy. This can increase your chance of falling. Ask your doctor what other things that you can do to help prevent falls. This information is not intended to replace advice given to you by your health care provider. Make sure you discuss any questions you have with your health care provider. Document Released: 06/13/2009 Document Revised: 01/23/2016 Document Reviewed: 09/21/2014 Elsevier Interactive Patient Education  2017 [employer].

## 2020-04-19 NOTE — Progress Notes (Signed)
I connected with Cynthia Jones today by telephone and verified that I am speaking with the correct person using two identifiers. Location patient: home Location provider: work Persons participating in the virtual visit: Hazley, Dezeeuw LPN.   I discussed the limitations, risks, security and privacy concerns of performing an evaluation and management service by telephone and the availability of in person appointments. I also discussed with the patient that there may be a patient responsible charge related to this service. The patient expressed understanding and verbally consented to this telephonic visit.    Interactive audio and video telecommunications were attempted between this provider and patient, however failed, due to patient having technical difficulties OR patient did not have access to video capability.  We continued and completed visit with audio only.    Vital signs may be patient reported or missing.   Subjective:   Cynthia Jones is a 79 y.o. female who presents for Medicare Annual (Subsequent) preventive examination.  Review of Systems     Cardiac Risk Factors include: advanced age (>16men, >73 women);diabetes mellitus;obesity (BMI >30kg/m2)     Objective:    Today's Vitals   04/19/20 1426  Weight: 220 lb (99.8 kg)  Height: 5' (1.524 m)   Body mass index is 42.97 kg/m.  Advanced Directives 04/19/2020 03/07/2020 04/12/2019 11/20/2018 11/01/2018 08/08/2018 05/09/2018  Does Patient Have a Medical Advance Directive? Yes Yes Yes Yes Yes No No  Type of Estate agent of [place];Living will - Living will;Healthcare Power of [employer] Power of [employer] Power of [place];Living will - -  Does patient want to make changes to medical advance directive? - - - No - Patient declined - - -  Copy of Healthcare Power of Attorney in Chart? Yes - validated most recent copy scanned in chart (See row information) - Yes -  validated most recent copy scanned in chart (See row information) No - copy requested - - -  Would patient like information on creating a medical advance directive? - - - - - No - Patient declined No - Patient declined    Current Medications (verified) Outpatient Encounter Medications as of 04/19/2020  Medication Sig  . ARIPiprazole (ABILIFY) 5 MG tablet Take 1 tablet (5 mg total) by mouth daily.  [name] b complex vitamins tablet Take 1 tablet by mouth daily.  . carvedilol (COREG) 6.25 MG tablet TAKE 1 TABLET (6.25 MG TOTAL) BY MOUTH 2 (TWO) TIMES DAILY WITH A MEAL.  . cyclobenzaprine (FLEXERIL) 5 MG tablet TAKE 1/2 TAB BY MOUTH 3 TIMES DAILY AS NEEDED FOR MUSCLE SPASMS  . diclofenac Sodium (VOLTAREN) 1 % GEL Apply 2 g topically 4 (four) times daily.  . DULoxetine (CYMBALTA) 60 MG capsule TAKE 1 CAPSULE BY MOUTH EVERY DAY  . esomeprazole (NEXIUM) 40 MG capsule Take 40 mg by mouth daily at 12 noon.  . hydrochlorothiazide (HYDRODIURIL) 12.5 MG tablet Take 1 tablet (12.5 mg total) by mouth daily.  [name] levothyroxine (SYNTHROID) 50 MCG tablet Take 1 tablet (50 mcg total) by mouth daily.  [name] losartan (COZAAR) 100 MG tablet TAKE 1 TABLET BY MOUTH EVERY DAY  . nystatin (MYCOSTATIN/NYSTOP) powder APPLY TO AFFECTED AREA 3 TIMES A DAY  . pregabalin (LYRICA) 50 MG capsule Take 50 mg by mouth 3 (three) times daily.  [name] UNABLE TO FIND Take by mouth as needed. [place] HBP  . UNABLE TO FIND Take by mouth daily. CBD gummies  . Vitamin D, Ergocalciferol, (DRISDOL) 1.25 MG (50000 UNIT) CAPS capsule  Take 1 capsule (50,000 Units total) by mouth every 7 (seven) days.   No facility-administered encounter medications on file as of 04/19/2020.    Allergies (verified) Nsaids and Erythromycin   History: Past Medical History:  Diagnosis Date  . Acute anxiety 03/06/2015  . Allergy   . Anxiety   . Degenerative joint disease of knee   . Depression   . Fibromyalgia   . Obesity   . Osteopenia   . RAD (reactive airway  disease)    Past Surgical History:  Procedure Laterality Date  . ABDOMINAL HYSTERECTOMY    . APPENDECTOMY    . BREAST CYST ASPIRATION Right 04/18/2013   neg  . CHOLECYSTECTOMY    . EYE SURGERY    . JOINT REPLACEMENT  2014   right knee  . PAROTIDECTOMY Left   . TONSILLECTOMY     Family History  Problem Relation Age of Onset  . Heart disease Mother   . Stroke Mother   . Diabetes Mother   . Alzheimer's disease Father   . Parkinson's disease Father   . Cancer Sister    Social History   Socioeconomic History  . Marital status: Widowed    Spouse name: Not on file  . Number of children: Not on file  . Years of education: Not on file  . Highest education level: 10th grade  Occupational History  . Not on file  Tobacco Use  . Smoking status: Never Smoker  . Smokeless tobacco: Never Used  Vaping Use  . Vaping Use: Never used  Substance and Sexual Activity  . Alcohol use: No    Alcohol/week: 0.0 standard drinks  . Drug use: No  . Sexual activity: Not on file  Other Topics Concern  . Not on file  Social History Narrative  . Not on file   Social Determinants of Health   Financial Resource Strain: Low Risk   . Difficulty of Paying Living Expenses: Not hard at all  Food Insecurity: No Food Insecurity  . Worried About Programme researcher, broadcasting/film/video in the Last Year: Never true  . Ran Out of Food in the Last Year: Never true  Transportation Needs: No Transportation Needs  . Lack of Transportation (Medical): No  . Lack of Transportation (Non-Medical): No  Physical Activity: Insufficiently Active  . Days of Exercise per Week: 7 days  . Minutes of Exercise per Session: 20 min  Stress: No Stress Concern Present  . Feeling of Stress : Not at all  Social Connections: Moderately Isolated  . Frequency of Communication with Friends and Family: More than three times a week  . Frequency of Social Gatherings with Friends and Family: More than three times a week  . Attends Religious  Services: More than 4 times per year  . Active Member of Clubs or Organizations: No  . Attends Banker Meetings: Never  . Marital Status: Widowed    Tobacco Counseling Counseling given: Not Answered   Clinical Intake:  Pre-visit preparation completed: Yes  Pain : No/denies pain     Nutritional Status: BMI > 30  Obese Nutritional Risks: None Diabetes: Yes  How often do you need to have someone help you when you read instructions, pamphlets, or other written materials from your doctor or pharmacy?: 1 - Never What is the last grade level you completed in school?: 10th grade  Diabetic? Yes Nutrition Risk Assessment:  Has the patient had any N/V/D within the last 2 months?  No  Does the patient  have any non-healing wounds?  No  Has the patient had any unintentional weight loss or weight gain?  No   Diabetes:  Is the patient diabetic?  Yes  If diabetic, was a CBG obtained today?  No  Did the patient bring in their glucometer from home?  No  How often do you monitor your CBG's? rarely.   Financial Strains and Diabetes Management:  Are you having any financial strains with the device, your supplies or your medication? No .  Does the patient want to be seen by Chronic Care Management for management of their diabetes?  No  Would the patient like to be referred to a Nutritionist or for Diabetic Management?  No   Diabetic Exams:  Diabetic Eye Exam: Overdue for diabetic eye exam. Pt has been advised about the importance in completing this exam. Patient advised to call and schedule an eye exam. Diabetic Foot Exam: Completed 06/16/2019  Interpreter Needed?: No  Information entered by :: NAllen LPN   Activities of Daily Living In your present state of health, do you have any difficulty performing the following activities: 04/19/2020 06/16/2019  Hearing? N N  Vision? N N  Difficulty concentrating or making decisions? N N  Walking or climbing stairs? Y Y    Dressing or bathing? Y N  Comment has an Engineer, production -  Doing errands, shopping? N Y  Quarry manager and eating ? Y -  Comment has an Engineer, production -  Using the Toilet? Y -  Comment has an aide -  In the past six months, have you accidently leaked urine? Y -  Comment with a hard cough -  Do you have problems with loss of bowel control? N -  Managing your Medications? Y -  Comment has set up -  Managing your Finances? N -  Housekeeping or managing your Housekeeping? Y -  Some recent data might be hidden    Patient Care Team: [name], PA-C as PCP - General (Family Medicine) Marlowe Sax, RN as Case Manager (General Practice) Gustavus Bryant, LCSW as Social Worker (Licensed Clinical Social Worker)  Indicate any recent [employer] you may have received from other than [facility] providers in the past year (date may be approximate).     Assessment:   This is a routine wellness examination for [place].  Hearing/Vision screen  Hearing Screening   125Hz  250Hz  500Hz  1000Hz  2000Hz  3000Hz  4000Hz  6000Hz  8000Hz   Right ear:           Left ear:           Vision Screening Comments: No regular eye exams, [facility]  Dietary issues and exercise activities discussed: Current Exercise Habits: Home exercise routine, Type of exercise: strength training/weights;stretching, Time (Minutes): 20, Frequency (Times/Week): 7, Weekly Exercise (Minutes/Week): 140  Goals    .  Increase water intake      Recommend drinking at least 6-8 glasses of water a day    .  Patient Stated      04/19/2020, wants to be more independent    .  RNCM: Mom is having a really hard time managing her pain and discomfort      CARE PLAN ENTRY (see longtitudinal plan of care for additional care plan information)  Current Barriers:  [name] Knowledge Deficits related to how to effectively manage pain as evidence by unresolved pain in patient with chronic pain syndrome, depression and anxiety . Unresolved grief,  depression and anxiety as evidence of losing her  spouse and having to move in with her daughter, recent death of her last living sibling on 10/29/2019  Nurse Case Manager Clinical Goal(s):  [name] Over the next 120 days, patient will verbalize understanding of plan for managing pain effectively, having decreased episodes of depression and anxiety . Over the next 120 days, patient will work with [facility] and pcp to address needs related to pain relief measures and dealing with her depression and anxiety  . Over the next 120 days, patient will demonstrate a decrease in pain exacerbations as evidenced by controlled pain . Over the next 120 days, patient will attend all scheduled medical appointments: Front office staff to make an appointment for the patient to follow up with pcp  Interventions:  . Evaluation of current treatment plan related to pain control and patient's adherence to plan as established by provider. 03-29-2020: The patient reports her pain is much better. She says the right knee hardly hurts at all the left knee is at 75% better. Has been getting shots in her knees and can see a huge difference. The patient also is ambulating without her walker and doing well. Her son Kathlene November says since she has moved back home she is doing very well.  . Provided education to patient re: if pain worsens or changes in level of intensity to call the MD. . Reviewed medications with patient and discussed compliance- 03-28-2020: The patient son states that since she has come back home and come off some of the medications she was on he has his "mama back". She is canning green beans, walking without her walker, not drooling on herself.  She is happy and doing well. He says she is more coherent and not "out of it" like she was. He says bringing her back to her home that she had his dad shared has been very good for the patient. They take her out to breakfast if she wants to go and she goes out on the porch and sits and does things  she use to do without difficulty.  . Evaluation of home health services. 03-29-2020: The patient has worked with PT/ST and has an aide that comes in and assist with basic needs. The patient per the son is more independent since she is back home from his sisters house. She came back [facility] on June 28th.  .   Reviewed scheduled/upcoming provider appointments including: next appointment with pcp on 04-01-2020. [name] Pharmacy consult for support and recommendations- completed.  The pharmacist is available for assistance as needed  . Social worker support for effective management of grief and depression.  The patient is currently working with the LCSW   Patient Self Care Activities:  . Patient verbalizes understanding of plan to work with the interdisciplinary team to help patient manage her pain, depression and anxiety more effectively . Self administers medications as prescribed . Attends all scheduled provider appointments . Calls provider office for new concerns or questions . Unable to independently manage pain, depression and anxiety related to chronic conditions . Unable to perform IADLs independently  Please see past updates related to this goal by clicking on the "Past Updates" button in the selected goal      .  RNCM: PT:"I am so happy to be back home" (pt-stated)      Current Barriers:  . Chronic Disease Management support, education, and care coordination needs related to HTN, Anxiety, Depression, and Chronic Pain . Grief and mourning over the loss of her husband and having to  move in with her daughter ( 2-25 is her husbands death anniversary). The patients last living sibling passed away on November 01, 2019. Feb 29, 2020 the patient moved back to the home she and her husband shared and her 2 sons are taking care of her. They live next door.   Clinical Goal(s) related to HTN, Anxiety, Depression, and Chronic Pain :  Over the next 120 days, patient will:  . Work with the care management team to  address educational, disease management, and care coordination needs  . Begin or continue self health monitoring activities as directed today Measure and record blood pressure 4 times per week . Call provider office for new or worsened signs and symptoms Blood pressure findings outside established parameters and New or worsened symptom related to Depression, Anxiety or Chronic Pain . Call care management team with questions or concerns . Verbalize basic understanding of patient centered plan of care established today  Interventions related to HTN, Anxiety, Depression, and Chronic Pain :  . Evaluation of current treatment plans and patient's adherence to plan as established by provider.  The patient is doing well and being compliant with the plan of care.  . Assessed patient understanding of disease states. 03-29-2020: Spoke with the patient and her son Kathlene November.  The patient has been back to her home since Feb 29, 2020 and has made significant improvement in her health and well being. The patient has not had any new falls. Is off of medications that are making her "out of it", has her pain under control, is cooking and canning green beans, and is walking without her walker. The patient expressed how happy she was to be back home.  . Assessed patient's education and care coordination needs.  The patient denies any new concerns at this time. The patient states she is enjoying "porch sitting" since the weather has gotten nicer. 03-29-2020: The patient is walking out in the yard and filling up the bird feeders. Her son says "I have my mama back". . Provided disease specific education to patient.  The patient reports a good appetite. The patient denies any unintentional weight loss. 03-29-2020: The patients son cooks for her a lot and he has finally gotten her to stop eating ice cream everyday. They have went with a sugar free option. He is working on helping the patient lose weight. He knows this will help with her pain  in her knees as well. The patient has improved significantly per the son and positive things since the patient has moved back home.  Steele Sizer with appropriate clinical care team members regarding patient needs.  The patient continues to work with the CCM team and knows when to reach out for questions or concerns.  . Advised the patient and the patient's son to take medications as prescribed. The son is managing her medications and said she had not been taking the xanax properly and that is why she was "out of it" all the time. The patient only takes it as needed and the son says she has not taken hardly any since being home.   Patient Self Care Activities related to HTN, Anxiety, Depression, and Chronic Pain :  . Patient is unable to independently self-manage chronic health conditions  Please see past updates related to this goal by clicking on the "Past Updates" button in the selected goal      .  SW: "Mom's anxiety and depression are getting worse since her brother passed." (pt-stated)      Current Barriers:  .  Chronic Mental Health needs related to depression, anxiety and most recently- grief . Limited social support . ADL IADL limitations . Mental Health Concerns  . Social Isolation . Memory Deficits . Suicidal Ideation/Homicidal Ideation: No  Clinical Social Work Goal(s):  [name] Over the next 120 days, patient will work with SW  bi-monthly  by telephone or in person to reduce or manage symptoms related to depression, grief and anxiety . Over the next 120 days, patient will demonstrate improved health management independence as evidenced by implementing appropriate self-care and mental health coping tools into her daily routine to combat stressors.  Interventions: . Patient interviewed and appropriate assessments performed: brief mental health assessment . Patient reports that recent medication adjustment by PCP has been helpful to her chronic pain. . Patient declines any further  issues with allergies since PCP prescribed her amoxicillin.  . Patient continues to reside with daughter [name] who provides ongoing care giving and supervision. [name] has made great efforts to accommodate patient and her safety needs in her home. Daughter reports ongoing home repairs [place] Daughter reports hat they cut off 2 inches of patient's bed so it cold be shorter and to her liking. She reports that they are going to build a walk in shower for patient as well. [name] confirms filing patient's pill boxes.  . Patient interviewed and appropriate assessments performed . Provided mental health counseling with regard to anxiety, depression, grief support  . Provided patient with information about available grief support resources within the area ([facility] and [facility]) email sent with this information to patient's family member. Patient receiving grief therapy through [facility]. Patient worked with counselor Jeanice Lim and completed 3 sessions but discontinued services because family reports that patient was not willing to talk or open up with therapist over the phone.  Family only wants virtual therapy at this time due to mobility issues. . Discussed plans with patient for ongoing care management follow up and provided patient with direct contact information for care management team . Advised patient to stay in close communication with [facility] for grief counseling and palliative care  . Collaborated with primary care provider re: medication concerns . Assisted patient/caregiver with obtaining information about health plan benefits . Provided education and assistance to client regarding Advanced Directives. . Provided education to patient/caregiver regarding level of care options. . Provided education to patient/caregiver about Hospice and/or Palliative Care services . Consulted with MD re: daughter wishes for PCP to make order  for [facility] Social Worker.  . Daughter declines any falls from patient within the last 3 weeks.  . Daughter reports having to start therapy as well due to ongoing stress and caregiver burnout.   Patient Self Care Activities:  [name] Motivation for treatment . Strong family or social support  Patient Coping Strengths:  . Supportive Relationships . Family . Hopefulness  Patient Self Care Deficits:  . Lacks social connections . Unable to perform ADLs independently . Unable to perform IADLs independently  Please see past updates related to this goal by clicking on the "Past Updates" button in the selected goal        Depression Screen PHQ 2/9 Scores 04/19/2020 12/08/2019 11/15/2019 10/16/2019 10/13/2019 06/16/2019 04/12/2019  PHQ - 2 Score 0 1 0 4 2 2  0  PHQ- 9 Score 0 - 1 18 3 4  -  Exception Documentation - - - - - - -    Fall Risk Fall Risk  04/19/2020 06/16/2019 04/12/2019 11/01/2018 10/18/2018  Falls in the past year? 0 0 0 0 0  Comment - - - - -  Number falls in past yr: - 0 - - -  Injury with Fall? - 0 - - -  Risk Factor Category  - - - - -  Risk for fall due to : Impaired mobility;Medication side effect - - - -  Risk for fall due to: Comment - - - - -  Follow up Falls evaluation completed;Education provided;Falls prevention discussed - - - -  Comment - - - - -    Any stairs in or around the home? No  If so, are there any without handrails? n/a Home free of loose throw rugs in walkways, pet beds, electrical cords, etc? Yes  Adequate lighting in your home to reduce risk of falls? Yes   ASSISTIVE DEVICES UTILIZED TO PREVENT FALLS:  Life alert? No  Use of a cane, walker or w/c? Yes  Grab bars in the bathroom? Yes  Shower chair or bench in shower? Yes  Elevated toilet seat or a handicapped toilet? No   TIMED UP AND GO:  Was the test performed? No ..  Cognitive Function:     6CIT Screen 04/19/2020 04/07/2018 03/05/2017  What Year? 0 points 0 points 0 points  What month? 0 points 0  points 0 points  What time? 0 points 0 points 0 points  Count back from 20 0 points 0 points 0 points  Months in reverse 0 points 0 points 0 points  Repeat phrase 0 points 0 points 2 points  Total Score 0 0 2    Immunizations Immunization History  Administered Date(s) Administered  . Fluad Quad(high Dose 65+) 05/29/2019  . Influenza, High Dose Seasonal PF 06/10/2016, 06/22/2017, 06/14/2018  . Influenza,inj,Quad PF,6+ Mos 06/11/2015  . PFIZER SARS-COV-2 Vaccination 11/10/2019, 11/24/2019  . Pneumococcal Conjugate-13 03/05/2014  . Pneumococcal Polysaccharide-23 01/19/2008  . Td 01/19/2008  . Tdap 04/07/2018    TDAP status: Up to date Flu Vaccine status: Up to date Pneumococcal vaccine status: Up to date Covid-19 vaccine status: Completed vaccines  Qualifies for Shingles Vaccine? Yes   Zostavax completed No   Shingrix Completed?: No.    Education has been provided regarding the importance of this vaccine. Patient has been advised to call insurance company to determine out of pocket expense if they have not yet received this vaccine. Advised may also receive vaccine at local pharmacy or Health Dept. Verbalized acceptance and understanding.  Screening Tests Health Maintenance  Topic Date Due  . Hepatitis C Screening  Never done  . OPHTHALMOLOGY EXAM  06/29/2019  . HEMOGLOBIN A1C  12/15/2019  . INFLUENZA VACCINE  03/31/2020  . FOOT EXAM  06/15/2020  . MAMMOGRAM  01/07/2021  . TETANUS/TDAP  04/07/2028  . DEXA SCAN  Completed  . COVID-19 Vaccine  Completed  . PNA vac Low Risk Adult  Completed    Health Maintenance  Health Maintenance Due  Topic Date Due  . Hepatitis C Screening  Never done  . OPHTHALMOLOGY EXAM  06/29/2019  . HEMOGLOBIN A1C  12/15/2019  . INFLUENZA VACCINE  03/31/2020    Colorectal cancer screening: No longer required.  Mammogram status: Completed 510/2021. Repeat every year Bone Density status: Completed 03/26/2014.   Lung Cancer Screening: (Low  Dose CT Chest recommended if Age 16-80 years, 30 pack-year currently smoking OR have quit w/in 15years.) does not qualify.   Lung Cancer Screening Referral: no  Additional Screening:  Hepatitis C Screening: does qualify;  Vision Screening: Recommended annual ophthalmology exams for early detection of glaucoma and other disorders of the eye. Is the patient up to date with their annual eye exam?  No  Who is the provider or what is the name of the office in which the patient attends annual eye exams? [facility] If pt is not established with a provider, would they like to be referred to a provider to establish care? No .   Dental Screening: Recommended annual dental exams for proper oral hygiene  Community Resource Referral / Chronic Care Management: CRR required this visit?  No   CCM required this visit?  No      Plan:     I have personally reviewed and noted the following in the patient's chart:   . Medical and social history . Use of alcohol, tobacco or illicit drugs  . Current medications and supplements . Functional ability and status . Nutritional status . Physical activity . Advanced directives . List of other physicians . Hospitalizations, surgeries, and ER visits in previous 12 months . Vitals . Screenings to include cognitive, depression, and falls . Referrals and appointments  In addition, I have reviewed and discussed with patient certain preventive protocols, quality metrics, and best practice recommendations. A written personalized care plan for preventive services as well as general preventive health recommendations were provided to patient.     Barb Merino, LPN   1/61/0960   Nurse Notes:

## 2020-04-22 DIAGNOSIS — F329 Major depressive disorder, single episode, unspecified: Secondary | ICD-10-CM | POA: Diagnosis not present

## 2020-04-22 DIAGNOSIS — M5481 Occipital neuralgia: Secondary | ICD-10-CM | POA: Diagnosis not present

## 2020-04-22 DIAGNOSIS — M8949 Other hypertrophic osteoarthropathy, multiple sites: Secondary | ICD-10-CM | POA: Diagnosis not present

## 2020-04-22 DIAGNOSIS — R1310 Dysphagia, unspecified: Secondary | ICD-10-CM | POA: Diagnosis not present

## 2020-04-22 DIAGNOSIS — E1140 Type 2 diabetes mellitus with diabetic neuropathy, unspecified: Secondary | ICD-10-CM | POA: Diagnosis not present

## 2020-04-22 DIAGNOSIS — M797 Fibromyalgia: Secondary | ICD-10-CM | POA: Diagnosis not present

## 2020-04-22 DIAGNOSIS — G8929 Other chronic pain: Secondary | ICD-10-CM | POA: Diagnosis not present

## 2020-04-22 DIAGNOSIS — M1712 Unilateral primary osteoarthritis, left knee: Secondary | ICD-10-CM | POA: Diagnosis not present

## 2020-04-22 DIAGNOSIS — F419 Anxiety disorder, unspecified: Secondary | ICD-10-CM | POA: Diagnosis not present

## 2020-04-23 DIAGNOSIS — M1712 Unilateral primary osteoarthritis, left knee: Secondary | ICD-10-CM | POA: Diagnosis not present

## 2020-04-23 DIAGNOSIS — F419 Anxiety disorder, unspecified: Secondary | ICD-10-CM | POA: Diagnosis not present

## 2020-04-23 DIAGNOSIS — M797 Fibromyalgia: Secondary | ICD-10-CM | POA: Diagnosis not present

## 2020-04-23 DIAGNOSIS — M5481 Occipital neuralgia: Secondary | ICD-10-CM | POA: Diagnosis not present

## 2020-04-23 DIAGNOSIS — E1140 Type 2 diabetes mellitus with diabetic neuropathy, unspecified: Secondary | ICD-10-CM | POA: Diagnosis not present

## 2020-04-23 DIAGNOSIS — G8929 Other chronic pain: Secondary | ICD-10-CM | POA: Diagnosis not present

## 2020-04-23 DIAGNOSIS — M8949 Other hypertrophic osteoarthropathy, multiple sites: Secondary | ICD-10-CM | POA: Diagnosis not present

## 2020-04-23 DIAGNOSIS — R1310 Dysphagia, unspecified: Secondary | ICD-10-CM | POA: Diagnosis not present

## 2020-04-23 DIAGNOSIS — F329 Major depressive disorder, single episode, unspecified: Secondary | ICD-10-CM | POA: Diagnosis not present

## 2020-04-25 ENCOUNTER — Telehealth: Payer: Self-pay | Admitting: Family Medicine

## 2020-04-25 DIAGNOSIS — F329 Major depressive disorder, single episode, unspecified: Secondary | ICD-10-CM | POA: Diagnosis not present

## 2020-04-25 DIAGNOSIS — F419 Anxiety disorder, unspecified: Secondary | ICD-10-CM | POA: Diagnosis not present

## 2020-04-25 DIAGNOSIS — R1310 Dysphagia, unspecified: Secondary | ICD-10-CM | POA: Diagnosis not present

## 2020-04-25 DIAGNOSIS — M797 Fibromyalgia: Secondary | ICD-10-CM | POA: Diagnosis not present

## 2020-04-25 DIAGNOSIS — M1712 Unilateral primary osteoarthritis, left knee: Secondary | ICD-10-CM | POA: Diagnosis not present

## 2020-04-25 DIAGNOSIS — G8929 Other chronic pain: Secondary | ICD-10-CM | POA: Diagnosis not present

## 2020-04-25 DIAGNOSIS — E1140 Type 2 diabetes mellitus with diabetic neuropathy, unspecified: Secondary | ICD-10-CM | POA: Diagnosis not present

## 2020-04-25 DIAGNOSIS — M8949 Other hypertrophic osteoarthropathy, multiple sites: Secondary | ICD-10-CM | POA: Diagnosis not present

## 2020-04-25 DIAGNOSIS — M5481 Occipital neuralgia: Secondary | ICD-10-CM | POA: Diagnosis not present

## 2020-04-25 NOTE — Telephone Encounter (Signed)
Home Health Verbal Orders - [facility]  Callback Number: [phone] Requesting OT/PT/Skilled Nursing/Social Work/Speech Therapy: PT  Frequency: 1 w 3 beginning 04/28/2020

## 2020-04-25 NOTE — Telephone Encounter (Signed)
OK to give verbal.

## 2020-04-26 ENCOUNTER — Telehealth: Payer: Self-pay

## 2020-04-26 ENCOUNTER — Telehealth: Payer: Self-pay | Admitting: Licensed Clinical Social Worker

## 2020-04-26 NOTE — Telephone Encounter (Signed)
  Chronic Care Management    Clinical Social Work General Follow Up Note  04/26/2020 Name: Cynthia Jones MRN: [id number] DOB: Nov 01, 1940  Cynthia Jones is a 79 y.o. year old female who is a primary care patient of [name], [place]. The CCM team was consulted for assistance with Level of Care Concerns and Grief Counseling.   Review of patient status, including review of consultants reports, relevant laboratory and other test results, and collaboration with appropriate care team members and the patient's provider was performed as part of comprehensive patient evaluation and provision of chronic care management services.    LCSW completed CCM outreach attempt today but was unable to reach patient successfully. A HIPPA compliant voice message was left encouraging patient to return call once available. LCSW rescheduled CCM SW appointment as well.  Outpatient Encounter Medications as of 04/26/2020  Medication Sig  . ARIPiprazole (ABILIFY) 5 MG tablet Take 1 tablet (5 mg total) by mouth daily.  [name] b complex vitamins tablet Take 1 tablet by mouth daily.  . carvedilol (COREG) 6.25 MG tablet TAKE 1 TABLET (6.25 MG TOTAL) BY MOUTH 2 (TWO) TIMES DAILY WITH A MEAL.  . cyclobenzaprine (FLEXERIL) 5 MG tablet TAKE 1/2 TAB BY MOUTH 3 TIMES DAILY AS NEEDED FOR MUSCLE SPASMS  . diclofenac Sodium (VOLTAREN) 1 % GEL Apply 2 g topically 4 (four) times daily.  . DULoxetine (CYMBALTA) 60 MG capsule TAKE 1 CAPSULE BY MOUTH EVERY DAY  . esomeprazole (NEXIUM) 40 MG capsule Take 40 mg by mouth daily at 12 noon.  . hydrochlorothiazide (HYDRODIURIL) 12.5 MG tablet Take 1 tablet (12.5 mg total) by mouth daily.  [name] levothyroxine (SYNTHROID) 50 MCG tablet Take 1 tablet (50 mcg total) by mouth daily.  [name] losartan (COZAAR) 100 MG tablet TAKE 1 TABLET BY MOUTH EVERY DAY  . nystatin (MYCOSTATIN/NYSTOP) powder APPLY TO AFFECTED AREA 3 TIMES A DAY  . pregabalin (LYRICA) 50 MG capsule Take 50 mg by mouth 3 (three)  times daily.  [name] UNABLE TO FIND Take by mouth as needed. Coriciden HBP  . UNABLE TO FIND Take by mouth daily. CBD gummies  . Vitamin D, Ergocalciferol, (DRISDOL) 1.25 MG (50000 UNIT) CAPS capsule Take 1 capsule (50,000 Units total) by mouth every 7 (seven) days.   No facility-administered encounter medications on file as of 04/26/2020.   Follow Up Plan: Embedded care coordination team will continue to follow patient progress and assist with care coordination needs.   Dickie La, BSW, MSW, LCSW [facility] Care Management [place]  [facility] [email or web address] Phone: [phone]

## 2020-04-26 NOTE — Telephone Encounter (Signed)
Called and gave verbal order per [name].

## 2020-04-29 DIAGNOSIS — G8929 Other chronic pain: Secondary | ICD-10-CM | POA: Diagnosis not present

## 2020-04-29 DIAGNOSIS — M5481 Occipital neuralgia: Secondary | ICD-10-CM | POA: Diagnosis not present

## 2020-04-29 DIAGNOSIS — E1140 Type 2 diabetes mellitus with diabetic neuropathy, unspecified: Secondary | ICD-10-CM | POA: Diagnosis not present

## 2020-04-29 DIAGNOSIS — M797 Fibromyalgia: Secondary | ICD-10-CM | POA: Diagnosis not present

## 2020-04-29 DIAGNOSIS — R1310 Dysphagia, unspecified: Secondary | ICD-10-CM | POA: Diagnosis not present

## 2020-04-29 DIAGNOSIS — F419 Anxiety disorder, unspecified: Secondary | ICD-10-CM | POA: Diagnosis not present

## 2020-04-29 DIAGNOSIS — F329 Major depressive disorder, single episode, unspecified: Secondary | ICD-10-CM | POA: Diagnosis not present

## 2020-04-29 DIAGNOSIS — M8949 Other hypertrophic osteoarthropathy, multiple sites: Secondary | ICD-10-CM | POA: Diagnosis not present

## 2020-04-29 DIAGNOSIS — M1712 Unilateral primary osteoarthritis, left knee: Secondary | ICD-10-CM | POA: Diagnosis not present

## 2020-04-30 DIAGNOSIS — M8949 Other hypertrophic osteoarthropathy, multiple sites: Secondary | ICD-10-CM | POA: Diagnosis not present

## 2020-04-30 DIAGNOSIS — E1140 Type 2 diabetes mellitus with diabetic neuropathy, unspecified: Secondary | ICD-10-CM | POA: Diagnosis not present

## 2020-04-30 DIAGNOSIS — F329 Major depressive disorder, single episode, unspecified: Secondary | ICD-10-CM | POA: Diagnosis not present

## 2020-04-30 DIAGNOSIS — M1712 Unilateral primary osteoarthritis, left knee: Secondary | ICD-10-CM | POA: Diagnosis not present

## 2020-04-30 DIAGNOSIS — G8929 Other chronic pain: Secondary | ICD-10-CM | POA: Diagnosis not present

## 2020-04-30 DIAGNOSIS — M5481 Occipital neuralgia: Secondary | ICD-10-CM | POA: Diagnosis not present

## 2020-04-30 DIAGNOSIS — M797 Fibromyalgia: Secondary | ICD-10-CM | POA: Diagnosis not present

## 2020-04-30 DIAGNOSIS — R1310 Dysphagia, unspecified: Secondary | ICD-10-CM | POA: Diagnosis not present

## 2020-04-30 DIAGNOSIS — F419 Anxiety disorder, unspecified: Secondary | ICD-10-CM | POA: Diagnosis not present

## 2020-05-01 ENCOUNTER — Other Ambulatory Visit: Payer: Self-pay | Admitting: Family Medicine

## 2020-05-01 NOTE — Telephone Encounter (Signed)
Requested medication (s) are due for refill today: yes  Requested medication (s) are on the active medication list: yes  Last refill:  10/17/2019  Future visit scheduled: yes  Notes to clinic:  this refill cannot be delegated    Requested Prescriptions  Pending Prescriptions Disp Refills   ARIPiprazole (ABILIFY) 5 MG tablet [Pharmacy Med Name: ARIPIPRAZOLE 5 MG TABLET] 90 tablet 1    Sig: TAKE 1 TABLET BY MOUTH EVERY DAY      Not Delegated - Psychiatry:  Antipsychotics - Second Generation (Atypical) - aripiprazole Failed - 05/01/2020  1:47 PM      Failed - This refill cannot be delegated      Passed - Valid encounter within last 6 months    Recent Outpatient Visits           1 month ago Lower leg edema   [facility] [name], [place]   2 months ago Essential hypertension   [facility] Roosvelt Maser [place], [place]   3 months ago Osteoarthritis of knee (Left)   [facility] [name], [place]   4 months ago Fibromyalgia   [facility] [place], [place]   5 months ago Acute non-recurrent maxillary sinusitis   [facility] [place], Salley Hews, [place]       Future Appointments             In 2 months Laural Benes, Oralia Rud, DO [employer], [facility]

## 2020-05-09 DIAGNOSIS — G8929 Other chronic pain: Secondary | ICD-10-CM | POA: Diagnosis not present

## 2020-05-09 DIAGNOSIS — M5481 Occipital neuralgia: Secondary | ICD-10-CM | POA: Diagnosis not present

## 2020-05-09 DIAGNOSIS — F329 Major depressive disorder, single episode, unspecified: Secondary | ICD-10-CM | POA: Diagnosis not present

## 2020-05-09 DIAGNOSIS — M1712 Unilateral primary osteoarthritis, left knee: Secondary | ICD-10-CM | POA: Diagnosis not present

## 2020-05-09 DIAGNOSIS — E1140 Type 2 diabetes mellitus with diabetic neuropathy, unspecified: Secondary | ICD-10-CM | POA: Diagnosis not present

## 2020-05-09 DIAGNOSIS — F419 Anxiety disorder, unspecified: Secondary | ICD-10-CM | POA: Diagnosis not present

## 2020-05-09 DIAGNOSIS — M797 Fibromyalgia: Secondary | ICD-10-CM | POA: Diagnosis not present

## 2020-05-09 DIAGNOSIS — M8949 Other hypertrophic osteoarthropathy, multiple sites: Secondary | ICD-10-CM | POA: Diagnosis not present

## 2020-05-09 DIAGNOSIS — R1310 Dysphagia, unspecified: Secondary | ICD-10-CM | POA: Diagnosis not present

## 2020-05-10 ENCOUNTER — Telehealth: Payer: Self-pay

## 2020-05-10 ENCOUNTER — Telehealth: Payer: Self-pay | Admitting: General Practice

## 2020-05-10 DIAGNOSIS — R1310 Dysphagia, unspecified: Secondary | ICD-10-CM | POA: Diagnosis not present

## 2020-05-10 DIAGNOSIS — M5481 Occipital neuralgia: Secondary | ICD-10-CM | POA: Diagnosis not present

## 2020-05-10 DIAGNOSIS — M1712 Unilateral primary osteoarthritis, left knee: Secondary | ICD-10-CM | POA: Diagnosis not present

## 2020-05-10 DIAGNOSIS — M797 Fibromyalgia: Secondary | ICD-10-CM | POA: Diagnosis not present

## 2020-05-10 DIAGNOSIS — F329 Major depressive disorder, single episode, unspecified: Secondary | ICD-10-CM | POA: Diagnosis not present

## 2020-05-10 DIAGNOSIS — E1140 Type 2 diabetes mellitus with diabetic neuropathy, unspecified: Secondary | ICD-10-CM | POA: Diagnosis not present

## 2020-05-10 DIAGNOSIS — M8949 Other hypertrophic osteoarthropathy, multiple sites: Secondary | ICD-10-CM | POA: Diagnosis not present

## 2020-05-10 DIAGNOSIS — G8929 Other chronic pain: Secondary | ICD-10-CM | POA: Diagnosis not present

## 2020-05-10 DIAGNOSIS — F419 Anxiety disorder, unspecified: Secondary | ICD-10-CM | POA: Diagnosis not present

## 2020-05-10 NOTE — Telephone Encounter (Signed)
  Chronic Care Management   Outreach Note  05/10/2020 Name: Cynthia Jones MRN: [id number] DOB: [birth year]  Referred by: [name], PA-C Reason for referral : Chronic Care Management (RNCM Follow up call for Chronic disease management and care coordination needs)   An unsuccessful telephone outreach was attempted today. The patient was referred to the case management team for assistance with care management and care coordination.   Follow Up Plan: A HIPPA compliant phone message was left for the patient providing contact information and requesting a return call.   Alto Denver RN, MSN, CCM Community Care Coordinator [place]  [facility] Mobile: [phone]

## 2020-05-14 ENCOUNTER — Other Ambulatory Visit: Payer: Medicare HMO | Admitting: Nurse Practitioner

## 2020-05-14 ENCOUNTER — Encounter: Payer: Self-pay | Admitting: Nurse Practitioner

## 2020-05-14 ENCOUNTER — Other Ambulatory Visit: Payer: Self-pay | Admitting: Family Medicine

## 2020-05-14 ENCOUNTER — Other Ambulatory Visit: Payer: Self-pay

## 2020-05-14 DIAGNOSIS — M797 Fibromyalgia: Secondary | ICD-10-CM

## 2020-05-14 DIAGNOSIS — R1310 Dysphagia, unspecified: Secondary | ICD-10-CM | POA: Diagnosis not present

## 2020-05-14 DIAGNOSIS — F329 Major depressive disorder, single episode, unspecified: Secondary | ICD-10-CM | POA: Diagnosis not present

## 2020-05-14 DIAGNOSIS — G8929 Other chronic pain: Secondary | ICD-10-CM | POA: Diagnosis not present

## 2020-05-14 DIAGNOSIS — M5481 Occipital neuralgia: Secondary | ICD-10-CM | POA: Diagnosis not present

## 2020-05-14 DIAGNOSIS — Z515 Encounter for palliative care: Secondary | ICD-10-CM | POA: Diagnosis not present

## 2020-05-14 DIAGNOSIS — M1712 Unilateral primary osteoarthritis, left knee: Secondary | ICD-10-CM | POA: Diagnosis not present

## 2020-05-14 DIAGNOSIS — E1140 Type 2 diabetes mellitus with diabetic neuropathy, unspecified: Secondary | ICD-10-CM | POA: Diagnosis not present

## 2020-05-14 DIAGNOSIS — M8949 Other hypertrophic osteoarthropathy, multiple sites: Secondary | ICD-10-CM | POA: Diagnosis not present

## 2020-05-14 DIAGNOSIS — F419 Anxiety disorder, unspecified: Secondary | ICD-10-CM | POA: Diagnosis not present

## 2020-05-14 NOTE — Progress Notes (Signed)
Therapist, nutritional Palliative Care Consult Note Telephone: [phone]  Fax: [phone]  PATIENT NAME: Cynthia Jones DOB: 1940/11/12 MRN: [id number]  PRIMARY CARE PROVIDER:   [name], PA-C  REFERRING PROVIDER:  [name], PA-C [address] [place],  [place] [address]  RESPONSIBLE PARTY:     1. Advance Care Planning;   2. Goals of Care: Goals include to maximize quality of life and symptom management. Our advance care planning conversation included a discussion about:     The value and importance of advance care planning   Exploration of personal, cultural or spiritual beliefs that might influence medical decisions   Exploration of goals of care in the event of a sudden injury or illness   Identification and preparation of a healthcare agent   Review and updating or creation of an  advance directive document. 3. Palliative care encounter; Palliative care encounter; Palliative medicine team will continue to support patient, patient's family, and medical team. Visit consisted of counseling and education dealing with the complex and emotionally intense issues of symptom management and palliative care in the setting of serious and potentially life-threatening illness  4. f/u 3 months or sooner if declines for chronic disease monitoring progression, functional decline  I spent 70 minutes providing this consultation,  from 3:30pm to 4:40pm. More than 50% of the time in this consultation was spent coordinating communication.   HISTORY OF PRESENT ILLNESS:  Cynthia Jones is a 79 y.o. year old female with multiple medical problems including cva, depression, DM, HTN, obesity, OA, RA, fibromyalgia.. Cynthia Jones has relocated back to her own home living with her son and her second son across the street. In person follow up palliative care is it with Cynthia Jones. Cynthia Jones and I talked about purpose of palliative care. Cynthia Jones  in agreement. We talked about how she's been feeling. Cynthia Jones endorses she has been doing much better. Cynthia Jones is little tired today but she did some work this morning and afternoon worked with the physical therapist. Cynthia Jones endorses her last visit with physical therapy was today but she got exercises now that she will continue to do. Cynthia Jones wish has already continued to increase mobility. No recent falls. No recent hospitalizations, wounds, infections. Cynthia Jones endorses she does need help with her bath and her daughter does come to help her do that. Appetite has been good as her son fixes her her meals. No new weight loss or edema. We talked about medical goals of care. We talked about symptoms of pain when she does experience in her bilateral knees. Cynthia Jones is followed at the pain clinic and does have an upcoming appointment for bilateral injections. Cynthia Jones endorses she has had injections in the past which have considerably helped her pain. Cynthia Jones endorses that she has been looking forward to Orthopedic appointment. We talked about quality of life. Cynthia Jones endorses she is very thankful she is back in her own home. We talked about role of palliative care and plan of care. We talked about Cynthia Jones at this time is stable. We talked about follow up palliative care visit in 3 months if needed or so should she declined. Cynthia Jones in agreement, appointment schedule. Therapeutic listening, emotional support provided. Contact information. Questions answered to satisfaction.  Palliative Care was asked to help to continue to address goals of care.   CODE STATUS: DNR  PPS: 50% HOSPICE ELIGIBILITY/DIAGNOSIS: TBD  PAST  MEDICAL HISTORY:  Past Medical History:  Diagnosis Date  . Acute anxiety 03/06/2015  . Allergy   . Anxiety   . Degenerative joint disease of knee   . Depression   . Fibromyalgia   . Obesity   . Osteopenia   . RAD (reactive airway  disease)     SOCIAL HX:  Social History   Tobacco Use  . Smoking status: Never Smoker  . Smokeless tobacco: Never Used  Substance Use Topics  . Alcohol use: No    Alcohol/week: 0.0 standard drinks    ALLERGIES:  Allergies  Allergen Reactions  . Nsaids Other (See Comments)    GI upset, hx of H pylori and gastric ulcer  . Erythromycin Nausea Only     PERTINENT MEDICATIONS:  Outpatient Encounter Medications as of 05/14/2020  Medication Sig  . ARIPiprazole (ABILIFY) 5 MG tablet TAKE 1 TABLET BY MOUTH EVERY DAY  . b complex vitamins tablet Take 1 tablet by mouth daily.  . carvedilol (COREG) 6.25 MG tablet TAKE 1 TABLET (6.25 MG TOTAL) BY MOUTH 2 (TWO) TIMES DAILY WITH A MEAL.  . cyclobenzaprine (FLEXERIL) 5 MG tablet TAKE 1/2 TAB BY MOUTH 3 TIMES DAILY AS NEEDED FOR MUSCLE SPASMS  . diclofenac Sodium (VOLTAREN) 1 % GEL Apply 2 g topically 4 (four) times daily.  . DULoxetine (CYMBALTA) 60 MG capsule TAKE 1 CAPSULE BY MOUTH EVERY DAY  . esomeprazole (NEXIUM) 40 MG capsule Take 40 mg by mouth daily at 12 noon.  . hydrochlorothiazide (HYDRODIURIL) 12.5 MG tablet Take 1 tablet (12.5 mg total) by mouth daily.  [name] levothyroxine (SYNTHROID) 50 MCG tablet TAKE 1 TABLET BY MOUTH EVERY DAY  . losartan (COZAAR) 100 MG tablet TAKE 1 TABLET BY MOUTH EVERY DAY  . nystatin (MYCOSTATIN/NYSTOP) powder APPLY TO AFFECTED AREA 3 TIMES A DAY  . pregabalin (LYRICA) 50 MG capsule Take 50 mg by mouth 3 (three) times daily.  [name] UNABLE TO FIND Take by mouth as needed. Coriciden HBP  . UNABLE TO FIND Take by mouth daily. CBD gummies  . Vitamin D, Ergocalciferol, (DRISDOL) 1.25 MG (50000 UNIT) CAPS capsule Take 1 capsule (50,000 Units total) by mouth every 7 (seven) days.   No facility-administered encounter medications on file as of 05/14/2020.    PHYSICAL EXAM:   General: NAD, obese pleasant female Cardiovascular: regular rate and rhythm Pulmonary: clear ant fields Neurological: ambulatory with  walker  Cynthia Lacorte [place], NP

## 2020-05-16 ENCOUNTER — Telehealth: Payer: Self-pay | Admitting: *Deleted

## 2020-05-16 ENCOUNTER — Telehealth: Payer: Self-pay

## 2020-05-16 ENCOUNTER — Encounter: Payer: Self-pay | Admitting: Pain Medicine

## 2020-05-16 ENCOUNTER — Other Ambulatory Visit: Payer: Self-pay

## 2020-05-16 ENCOUNTER — Ambulatory Visit: Payer: Medicare HMO | Attending: Pain Medicine | Admitting: Pain Medicine

## 2020-05-16 VITALS — BP 142/63 | HR 68 | Temp 98.2°F | Ht 60.0 in | Wt 222.0 lb

## 2020-05-16 DIAGNOSIS — G8929 Other chronic pain: Secondary | ICD-10-CM | POA: Diagnosis not present

## 2020-05-16 DIAGNOSIS — M25562 Pain in left knee: Secondary | ICD-10-CM

## 2020-05-16 DIAGNOSIS — M1712 Unilateral primary osteoarthritis, left knee: Secondary | ICD-10-CM | POA: Insufficient documentation

## 2020-05-16 MED ORDER — LIDOCAINE HCL (PF) 1 % IJ SOLN
5.0000 mL | Freq: Once | INTRAMUSCULAR | Status: AC
  Administered 2020-05-16: 5 mL

## 2020-05-16 MED ORDER — ROPIVACAINE HCL 2 MG/ML IJ SOLN
5.0000 mL | Freq: Once | INTRAMUSCULAR | Status: AC
  Administered 2020-05-16: 5 mL via INTRA_ARTICULAR

## 2020-05-16 MED ORDER — LIDOCAINE HCL 2 % IJ SOLN
INTRAMUSCULAR | Status: AC
  Filled 2020-05-16: qty 10

## 2020-05-16 MED ORDER — SODIUM HYALURONATE (VISCOSUP) 20 MG/2ML IX SOSY: 2.0000 mL | PREFILLED_SYRINGE | Freq: Once | INTRA_ARTICULAR | Status: AC

## 2020-05-16 NOTE — Patient Instructions (Signed)

## 2020-05-16 NOTE — Telephone Encounter (Signed)
Tried calling patient to schedule next knee injection, no answer, no vm available

## 2020-05-16 NOTE — Progress Notes (Signed)
Safety precautions to be maintained throughout the outpatient stay will include: orient to surroundings, keep bed in low position, maintain call bell within reach at all times, provide assistance with transfer out of bed and ambulation.  

## 2020-05-16 NOTE — Progress Notes (Signed)
PROVIDER NOTE: Information contained herein reflects review and annotations entered in association with encounter. Interpretation of such information and data should be left to medically-trained personnel. Information provided to patient can be located elsewhere in the medical record under "Patient Instructions". Document created using STT-dictation technology, any transcriptional errors that may result from process are unintentional.    Patient: Cynthia Jones  Service Category: Procedure  Provider: Oswaldo Done, MD  DOB: Aug 15, 1941  DOS: 05/16/2020  Location: [facility]  MRN: [id number]  Setting: Ambulatory - outpatient  Referring Provider: [name],*  Type: Established Patient  Specialty: Interventional Pain Management  PCP: [name], PA-C   Primary Reason for Visit: Interventional Pain Management Treatment. CC: Knee Pain  Procedure:          Anesthesia, Analgesia, Anxiolysis:  Type: Therapeutic Intra-Articular Hyalgan Knee Injection #1S2  Region: Lateral infrapatellar Knee Region Level: Knee Joint Laterality: Left knee  Type: Local Anesthesia Indication(s): Analgesia         Local Anesthetic: Lidocaine 1-2% Route: Infiltration (Muse/IM) IV Access: Declined Sedation: Declined   Position: Sitting   Indications: 1. Chronic knee pain (Left)   2. Osteoarthritis of knee (Left)    Pain Score: Pre-procedure: 7 /10 Post-procedure: 0-No pain/10   Pre-op Assessment:  Cynthia Jones is a 79 y.o. (year old), female patient, seen today for interventional treatment. She  has a past surgical history that includes Parotidectomy (Left); Appendectomy; Cholecystectomy; Abdominal hysterectomy; Tonsillectomy; Joint replacement (2014); Eye surgery; and Breast cyst aspiration (Right, 04/18/2013). Cynthia Jones has a current medication list which includes the following prescription(s): aripiprazole, b complex vitamins, carvedilol, cyclobenzaprine,  diclofenac sodium, duloxetine, esomeprazole, hydrochlorothiazide, levothyroxine, losartan, nystatin, pregabalin, UNABLE TO FIND, UNABLE TO FIND, and vitamin d (ergocalciferol). Her primarily concern today is the Knee Pain  Initial Vital Signs:  Pulse/HCG Rate: 68  Temp: 98.2 F (36.8 C) Resp:   BP: (!) 142/63 SpO2: 98 %  BMI: Estimated body mass index is 43.36 kg/m as calculated from the following:   Height as of this encounter: 5' (1.524 m).   Weight as of this encounter: 222 lb (100.7 kg).  Risk Assessment: Allergies: Reviewed. She is allergic to nsaids and erythromycin.  Allergy Precautions: None required Coagulopathies: Reviewed. None identified.  Blood-thinner therapy: None at this time Active Infection(s): Reviewed. None identified. Cynthia Jones is afebrile  Site Confirmation: Cynthia Jones was asked to confirm the procedure and laterality before marking the site Procedure checklist: Completed Consent: Before the procedure and under the influence of no sedative(s), amnesic(s), or anxiolytics, the patient was informed of the treatment options, risks and possible complications. To fulfill our ethical and legal obligations, as recommended by the American Medical Association's Code of Ethics, I have informed the patient of my clinical impression; the nature and purpose of the treatment or procedure; the risks, benefits, and possible complications of the intervention; the alternatives, including doing nothing; the risk(s) and benefit(s) of the alternative treatment(s) or procedure(s); and the risk(s) and benefit(s) of doing nothing. The patient was provided information about the general risks and possible complications associated with the procedure. These may include, but are not limited to: failure to achieve desired goals, infection, bleeding, organ or nerve damage, allergic reactions, paralysis, and death. In addition, the patient was informed of those risks and complications  associated to the procedure, such as failure to decrease pain; infection; bleeding; organ or nerve damage with subsequent damage to sensory, motor, and/or autonomic systems, resulting in permanent pain, numbness,  and/or weakness of one or several areas of the body; allergic reactions; (i.e.: anaphylactic reaction); and/or death. Furthermore, the patient was informed of those risks and complications associated with the medications. These include, but are not limited to: allergic reactions (i.e.: anaphylactic or anaphylactoid reaction(s)); adrenal axis suppression; blood sugar elevation that in diabetics may result in ketoacidosis or comma; water retention that in patients with history of congestive heart failure may result in shortness of breath, pulmonary edema, and decompensation with resultant heart failure; weight gain; swelling or edema; medication-induced neural toxicity; particulate matter embolism and blood vessel occlusion with resultant organ, and/or nervous system infarction; and/or aseptic necrosis of one or more joints. Finally, the patient was informed that Medicine is not an exact science; therefore, there is also the possibility of unforeseen or unpredictable risks and/or possible complications that may result in a catastrophic outcome. The patient indicated having understood very clearly. We have given the patient no guarantees and we have made no promises. Enough time was given to the patient to ask questions, all of which were answered to the patient's satisfaction. Cynthia Jones has indicated that she wanted to continue with the procedure. Attestation: I, the ordering provider, attest that I have discussed with the patient the benefits, risks, side-effects, alternatives, likelihood of achieving goals, and potential problems during recovery for the procedure that I have provided informed consent. Date   Time: 05/16/2020 10:51 AM  Pre-Procedure Preparation:  Monitoring: As per clinic protocol.  Respiration, ETCO2, SpO2, BP, heart rate and rhythm monitor placed and checked for adequate function Safety Precautions: Patient was assessed for positional comfort and pressure points before starting the procedure. Time-out: I initiated and conducted the "Time-out" before starting the procedure, as per protocol. The patient was asked to participate by confirming the accuracy of the "Time Out" information. Verification of the correct person, site, and procedure were performed and confirmed by me, the nursing staff, and the patient. "Time-out" conducted as per Joint Commission's Universal Protocol (UP.01.01.01). Time: 1105  Description of Procedure:          Target Area: Knee Joint Approach: Just above the Lateral tibial plateau, lateral to the infrapatellar tendon. Area Prepped: Entire knee area, from the mid-thigh to the mid-shin. DuraPrep (Iodine Povacrylex [0.7% available iodine] and Isopropyl Alcohol, 74% w/w) Safety Precautions: Aspiration looking for blood return was conducted prior to all injections. At no point did we inject any substances, as a needle was being advanced. No attempts were made at seeking any paresthesias. Safe injection practices and needle disposal techniques used. Medications properly checked for expiration dates. SDV (single dose vial) medications used. Description of the Procedure: Protocol guidelines were followed. The patient was placed in position over the fluoroscopy table. The target area was identified and the area prepped in the usual manner. Skin & deeper tissues infiltrated with local anesthetic. Appropriate amount of time allowed to pass for local anesthetics to take effect. The procedure needles were then advanced to the target area. Proper needle placement secured. Negative aspiration confirmed. Solution injected in intermittent fashion, asking for systemic symptoms every 0.5cc of injectate. The needles were then removed and the area cleansed, making sure to leave  some of the prepping solution back to take advantage of its long term bactericidal properties. Vitals:   05/16/20 1049  BP: (!) 142/63  Pulse: 68  Temp: 98.2 F (36.8 C)  SpO2: 98%  Weight: 222 lb (100.7 kg)  Height: 5' (1.524 m)    Start Time: 1105 hrs. End Time:  1107 hrs. Materials:  Needle(s) Type: Regular needle Gauge: 25G Length: 1.5-in Medication(s): Please see orders for medications and dosing details.  Imaging Guidance:          Type of Imaging Technique: None used Indication(s): N/A Exposure Time: No patient exposure Contrast: None used. Fluoroscopic Guidance: N/A Ultrasound Guidance: N/A Interpretation: N/A  Antibiotic Prophylaxis:   Anti-infectives (From admission, onward)   None     Indication(s): None identified  Post-operative Assessment:  Post-procedure Vital Signs:  Pulse/HCG Rate: 68  Temp: 98.2 F (36.8 C) Resp:   BP: (!) 142/63 SpO2: 98 %  EBL: None  Complications: No immediate post-treatment complications observed by team, or reported by patient.  Note: The patient tolerated the entire procedure well. A repeat set of vitals were taken after the procedure and the patient was kept under observation following institutional policy, for this type of procedure. Post-procedural neurological assessment was performed, showing return to baseline, prior to discharge. The patient was provided with post-procedure discharge instructions, including a section on how to identify potential problems. Should any problems arise concerning this procedure, the patient was given instructions to immediately contact us, at any time, without hesitation. In any case, we plan to contact the patient by telephone for a follow-up status report regarding this interventional procedure.  Comments:  No additional relevant information.  Plan of Care  Orders:  Orders Placed This Encounter  Procedures   KNEE INJECTION    Hyalgan knee injection to be done by MD.    Scheduling  Instructions:     Procedure: Intra-articular Hyalgan Knee injection #1S2     Side(s): Left Knee     Sedation: None     Timeframe: Today    Order Specific Question:   Where will this procedure be performed?    Answer:   [facility]   Informed Consent Details: Physician/Practitioner Attestation; Transcribe to consent form and obtain patient signature    Provider Attestation: I, Shenouda Jones A. Laban Emperor, MD, (Pain Management Specialist), the physician/practitioner, attest that I have discussed with the patient the benefits, risks, side effects, alternatives, likelihood of achieving goals and potential problems during recovery for the procedure that I have provided informed consent.    Scheduling Instructions:     Procedure: Therapeutic, left sided, intra-articular Hyalgan knee injection     Indications: Chronic left knee pain secondary to primary osteoarthritis of the knee     Note: Always confirm laterality of pain with Cynthia Jones, before procedure.     Transcribe to consent form and obtain patient signature.   Chronic Opioid Analgesic:  No opioid analgesics prescribed by our practice.  At one time, I did prescribe some oxycodone for her, but she is currently not using it.  She did mention to me that she has been using the CBD Gummies.  I took the time to explain to her the medical legal issues associated with that and the possibility that she may test positive on a UDS for carboxy-THC. MME/day:0mg /day.   Medications ordered for procedure: Meds ordered this encounter  Medications   lidocaine (PF) (XYLOCAINE) 1 % injection 5 mL   ropivacaine (PF) 2 mg/mL (0.2%) (NAROPIN) injection 5 mL   Sodium Hyaluronate SOSY 2 mL   Medications administered: We administered lidocaine (PF), ropivacaine (PF) 2 mg/mL (0.2%), and Sodium Hyaluronate.  See the medical record for exact dosing, route, and time of administration.  Follow-up plan:   Return in about 3 weeks (around 06/06/2020) for  Procedure (no sedation): (L) Hyalgan #  2/S2.       Interventional treatment options: Planned, scheduled, and/or pending:   No further increases on opioid analgesics    Under consideration:   Possible right Genicular nerve RFA  Diagnostic left Genicular NB Possibleleft Genicular nerve RFA Possible bilateral lumbar facet RFA Diagnostic left LESI Diagnostic caudal ESI + epidurogram Possible Racz procedure Diagnostic bilateral cervical facet block Possible bilateral cervical facet RFA Diagnostic right versus left CESI Diagnostic bilateral IA shoulder joint injection Diagnostic bilateral suprascapular NB Possible bilateral suprascapular nerve RFA   Therapeutic/palliative (PRN):  Diagnostic right Genicular NB #2(90/90/50)  Diagnosticleft IA knee joint injection #2(100/100/90)  Palliativeleft IA Hyalgan Knee injection S2N1(last completed on 11/01/2018)  Diagnostic bilateral lumbar facet block #2    Recent Visits Date Type Provider Dept  03/28/20 Telemedicine Delano Metz, MD [facility]  03/07/20 Procedure visit Delano Metz, MD [facility]  02/26/20 Telemedicine Delano Metz, MD [facility]  Showing recent visits within past 90 days and meeting all other requirements Today's Visits Date Type Provider Dept  05/16/20 Procedure visit Delano Metz, MD [facility]  Showing today's visits and meeting all other requirements Future Appointments No visits were found meeting these conditions. Showing future appointments within next 90 days and meeting all other requirements  Disposition: Discharge home  Discharge (Date   Time): 05/16/2020; 1109 hrs.   Primary Care Physician: [name], PA-C Location: [facility] Note by: Oswaldo Done, MD Date: 05/16/2020; Time: 11:11 AM  Disclaimer:  Medicine is not an Visual merchandiser. The only guarantee in medicine is that  nothing is guaranteed. It is important to note that the decision to proceed with this intervention was based on the information collected from the patient. The Data and conclusions were drawn from the patient's questionnaire, the interview, and the physical examination. Because the information was provided in large part by the patient, it cannot be guaranteed that it has not been purposely or unconsciously manipulated. Every effort has been made to obtain as much relevant data as possible for this evaluation. It is important to note that the conclusions that lead to this procedure are derived in large part from the available data. Always take into account that the treatment will also be dependent on availability of resources and existing treatment guidelines, considered by other Pain Management Practitioners as being common knowledge and practice, at the time of the intervention. For Medico-Legal purposes, it is also important to point out that variation in procedural techniques and pharmacological choices are the acceptable norm. The indications, contraindications, technique, and results of the above procedure should only be interpreted and judged by a Board-Certified Interventional Pain Specialist with extensive familiarity and expertise in the same exact procedure and technique.

## 2020-05-17 ENCOUNTER — Telehealth: Payer: Self-pay

## 2020-05-17 DIAGNOSIS — F329 Major depressive disorder, single episode, unspecified: Secondary | ICD-10-CM | POA: Diagnosis not present

## 2020-05-17 DIAGNOSIS — E1140 Type 2 diabetes mellitus with diabetic neuropathy, unspecified: Secondary | ICD-10-CM | POA: Diagnosis not present

## 2020-05-17 DIAGNOSIS — G8929 Other chronic pain: Secondary | ICD-10-CM | POA: Diagnosis not present

## 2020-05-17 DIAGNOSIS — M8949 Other hypertrophic osteoarthropathy, multiple sites: Secondary | ICD-10-CM | POA: Diagnosis not present

## 2020-05-17 DIAGNOSIS — M797 Fibromyalgia: Secondary | ICD-10-CM | POA: Diagnosis not present

## 2020-05-17 DIAGNOSIS — M5481 Occipital neuralgia: Secondary | ICD-10-CM | POA: Diagnosis not present

## 2020-05-17 DIAGNOSIS — M1712 Unilateral primary osteoarthritis, left knee: Secondary | ICD-10-CM | POA: Diagnosis not present

## 2020-05-17 DIAGNOSIS — R1310 Dysphagia, unspecified: Secondary | ICD-10-CM | POA: Diagnosis not present

## 2020-05-17 DIAGNOSIS — F419 Anxiety disorder, unspecified: Secondary | ICD-10-CM | POA: Diagnosis not present

## 2020-05-17 NOTE — Telephone Encounter (Signed)
Pt has been r/s  

## 2020-05-17 NOTE — Telephone Encounter (Signed)
Post procedure phone call.  Patient states she is doing well.  

## 2020-05-29 ENCOUNTER — Other Ambulatory Visit: Payer: Self-pay | Admitting: Pain Medicine

## 2020-05-29 ENCOUNTER — Other Ambulatory Visit: Payer: Self-pay | Admitting: Family Medicine

## 2020-05-29 DIAGNOSIS — I10 Essential (primary) hypertension: Secondary | ICD-10-CM

## 2020-05-29 DIAGNOSIS — M25561 Pain in right knee: Secondary | ICD-10-CM

## 2020-05-29 DIAGNOSIS — M1712 Unilateral primary osteoarthritis, left knee: Secondary | ICD-10-CM

## 2020-05-29 NOTE — Telephone Encounter (Signed)
Requested Prescriptions  Pending Prescriptions Disp Refills  . Vitamin D, Ergocalciferol, (DRISDOL) 1.25 MG (50000 UNIT) CAPS capsule [Pharmacy Med Name: VITAMIN D2 1.25MG (50,000 UNIT)] 12 capsule 1    Sig: TAKE 1 CAPSULE (50,000 UNITS TOTAL) BY MOUTH EVERY 7 (SEVEN) DAYS.     Endocrinology:  Vitamins - Vitamin D Supplementation Failed - 05/29/2020 10:48 AM      Failed - 50,000 IU strengths are not delegated      Failed - Phosphate in normal range and within 360 days    No results found for: PHOS       Failed - Vitamin D in normal range and within 360 days    25-Hydroxy, Vitamin D-3  Date Value Ref Range Status  08/08/2018 20 ng/mL Final   25-Hydroxy, Vitamin D-2  Date Value Ref Range Status  08/08/2018 <1.0 ng/mL Final   25-Hydroxy, Vitamin D  Date Value Ref Range Status  08/08/2018 20 (L) ng/mL Final    Comment:    Reference Range: All Ages: Target levels 30 - 100          Passed - Ca in normal range and within 360 days    Calcium  Date Value Ref Range Status  03/01/2020 9.4 8.7 - 10.3 mg/dL Final         Passed - Valid encounter within last 12 months    Recent Outpatient Visits          1 month ago Lower leg edema   [facility] Roosvelt Maser [place], [place]   2 months ago Essential hypertension   [facility] Roosvelt Maser [place], [place]   4 months ago Osteoarthritis of knee (Left)   [facility] [name], [place]   5 months ago Fibromyalgia   [facility] Roosvelt Maser [place], [place]   6 months ago Acute non-recurrent maxillary sinusitis   [facility] [place], Salley Hews, [place]      Future Appointments            In 2 weeks Laural Benes, Megan P, DO [employer], [facility]   In 1 month Johnson, Megan P, DO [facility], [facility]           . hydrochlorothiazide (HYDRODIURIL) 12.5 MG tablet [Pharmacy Med Name: HYDROCHLOROTHIAZIDE 12.5 MG TB] 90 tablet 0    Sig: TAKE 1 TABLET  BY MOUTH EVERY DAY     Cardiovascular: Diuretics - Thiazide Failed - 05/29/2020 10:48 AM      Failed - Last BP in normal range    BP Readings from Last 1 Encounters:  05/16/20 (!) 142/63         Passed - Ca in normal range and within 360 days    Calcium  Date Value Ref Range Status  03/01/2020 9.4 8.7 - 10.3 mg/dL Final         Passed - Cr in normal range and within 360 days    Creatinine, Ser  Date Value Ref Range Status  03/01/2020 0.89 0.57 - 1.00 mg/dL Final         Passed - K in normal range and within 360 days    Potassium  Date Value Ref Range Status  03/01/2020 4.4 3.5 - 5.2 mmol/L Final         Passed - Na in normal range and within 360 days    Sodium  Date Value Ref Range Status  03/01/2020 140 134 - 144 mmol/L Final  Passed - Valid encounter within last 6 months    Recent Outpatient Visits          1 month ago Lower leg edema   [facility] Roosvelt Maser [place], [place]   2 months ago Essential hypertension   [facility] Roosvelt Maser [place], [place]   4 months ago Osteoarthritis of knee (Left)   [facility] [name], [place]   5 months ago Fibromyalgia   [facility] Roosvelt Maser [place], [place]   6 months ago Acute non-recurrent maxillary sinusitis   [facility] [name], [place]      Future Appointments            In 2 weeks Laural Benes, Oralia Rud, DO [employer], [facility]   In 1 month Laural Benes, Oralia Rud, DO [employer], [facility]

## 2020-05-29 NOTE — Telephone Encounter (Signed)
Requested medication (s) are due for refill today:  Yes  Requested medication (s) are on the active medication list:  Yes  Future visit scheduled:  Yes  Last Refill: 11/10/19; #12; RF x 1  Notes to clinic : not delegated  Requested Prescriptions  Pending Prescriptions Disp Refills   Vitamin D, Ergocalciferol, (DRISDOL) 1.25 MG (50000 UNIT) CAPS capsule [Pharmacy Med Name: VITAMIN D2 1.25MG (50,000 UNIT)] 12 capsule 1    Sig: TAKE 1 CAPSULE (50,000 UNITS TOTAL) BY MOUTH EVERY 7 (SEVEN) DAYS.      Endocrinology:  Vitamins - Vitamin D Supplementation Failed - 05/29/2020 10:48 AM      Failed - 50,000 IU strengths are not delegated      Failed - Phosphate in normal range and within 360 days    No results found for: PHOS        Failed - Vitamin D in normal range and within 360 days    25-Hydroxy, Vitamin D-3  Date Value Ref Range Status  08/08/2018 20 ng/mL Final   25-Hydroxy, Vitamin D-2  Date Value Ref Range Status  08/08/2018 <1.0 ng/mL Final   25-Hydroxy, Vitamin D  Date Value Ref Range Status  08/08/2018 20 (L) ng/mL Final    Comment:    Reference Range: All Ages: Target levels 30 - 100           Passed - Ca in normal range and within 360 days    Calcium  Date Value Ref Range Status  03/01/2020 9.4 8.7 - 10.3 mg/dL Final          Passed - Valid encounter within last 12 months    Recent Outpatient Visits           1 month ago Lower leg edema   [facility] [name], [place]   2 months ago Essential hypertension   [facility] Roosvelt Maser [place], [place]   4 months ago Osteoarthritis of knee (Left)   [facility] Roosvelt Maser [place], [place]   5 months ago Fibromyalgia   [facility] Roosvelt Maser [place], [place]   6 months ago Acute non-recurrent maxillary sinusitis   [facility] [place], Salley Hews, [place]       Future Appointments             In 2 weeks Laural Benes, Megan P, DO  [employer], [facility]   In 1 month Johnson, Megan P, DO [facility], [facility]             Signed Prescriptions Disp Refills   hydrochlorothiazide (HYDRODIURIL) 12.5 MG tablet 90 tablet 0    Sig: TAKE 1 TABLET BY MOUTH EVERY DAY      Cardiovascular: Diuretics - Thiazide Failed - 05/29/2020 10:48 AM      Failed - Last BP in normal range    BP Readings from Last 1 Encounters:  05/16/20 (!) 142/63          Passed - Ca in normal range and within 360 days    Calcium  Date Value Ref Range Status  03/01/2020 9.4 8.7 - 10.3 mg/dL Final          Passed - Cr in normal range and within 360 days    Creatinine, Ser  Date Value Ref Range Status  03/01/2020 0.89 0.57 - 1.00 mg/dL Final          Passed - K in normal range and within 360 days    Potassium  Date  Value Ref Range Status  03/01/2020 4.4 3.5 - 5.2 mmol/L Final          Passed - Na in normal range and within 360 days    Sodium  Date Value Ref Range Status  03/01/2020 140 134 - 144 mmol/L Final          Passed - Valid encounter within last 6 months    Recent Outpatient Visits           1 month ago Lower leg edema   [facility] Roosvelt Maser [place], [place]   2 months ago Essential hypertension   [facility] Roosvelt Maser [place], [place]   4 months ago Osteoarthritis of knee (Left)   [facility] [name], [place]   5 months ago Fibromyalgia   [facility] Roosvelt Maser [place], [place]   6 months ago Acute non-recurrent maxillary sinusitis   [facility] [name], [place]       Future Appointments             In 2 weeks Laural Benes, Oralia Rud, DO [employer] [facility], [facility]   In 1 month Laural Benes, Oralia Rud, DO [employer], [facility]

## 2020-05-29 NOTE — Telephone Encounter (Signed)
Requested medication (s) are due for refill today: yes  Requested medication (s) are on the active medication list: yes  Last refill:  08/29/19  #90  4 refills  Future visit scheduled:yes  Notes to clinic:  pharmacy requesting Dx code,  failed BP 142/63    Requested Prescriptions  Pending Prescriptions Disp Refills   losartan (COZAAR) 100 MG tablet [Pharmacy Med Name: LOSARTAN POTASSIUM 100 MG TAB] 90 tablet 4    Sig: TAKE 1 TABLET BY MOUTH EVERY DAY      Cardiovascular:  Angiotensin Receptor Blockers Failed - 05/29/2020 10:51 AM      Failed - Last BP in normal range    BP Readings from Last 1 Encounters:  05/16/20 (!) 142/63          Passed - Cr in normal range and within 180 days    Creatinine, Ser  Date Value Ref Range Status  03/01/2020 0.89 0.57 - 1.00 mg/dL Final          Passed - K in normal range and within 180 days    Potassium  Date Value Ref Range Status  03/01/2020 4.4 3.5 - 5.2 mmol/L Final          Passed - Patient is not pregnant      Passed - Valid encounter within last 6 months    Recent Outpatient Visits           1 month ago Lower leg edema   [facility] [name], [place]   2 months ago Essential hypertension   [facility] Roosvelt Maser [place], [place]   4 months ago Osteoarthritis of knee (Left)   [facility] [name], [place]   5 months ago Fibromyalgia   [facility] Roosvelt Maser [place], [place]   6 months ago Acute non-recurrent maxillary sinusitis   [facility] [place], Salley Hews, [place]       Future Appointments             In 2 weeks Laural Benes, Oralia Rud, DO [employer]   In 1 month [place], Oralia Rud, DO [employer], [facility]

## 2020-05-29 NOTE — Telephone Encounter (Signed)
Previous RL patient. Last seen 04/09/20

## 2020-05-31 ENCOUNTER — Other Ambulatory Visit: Payer: Self-pay | Admitting: Family Medicine

## 2020-05-31 NOTE — Telephone Encounter (Signed)
Requested medication (s) are due for refill today: yes  Requested medication (s) are on the active medication list: yes  Last refill: 11/15/19  Future visit scheduled: yes  Notes to clinic:  not delegated    Requested Prescriptions  Pending Prescriptions Disp Refills   Vitamin D, Ergocalciferol, (DRISDOL) 1.25 MG (50000 UNIT) CAPS capsule [Pharmacy Med Name: VITAMIN D2 1.25MG (50,000 UNIT)] 12 capsule 1    Sig: TAKE 1 CAPSULE (50,000 UNITS TOTAL) BY MOUTH EVERY 7 (SEVEN) DAYS.      Endocrinology:  Vitamins - Vitamin D Supplementation Failed - 05/31/2020 10:55 AM      Failed - 50,000 IU strengths are not delegated      Failed - Phosphate in normal range and within 360 days    No results found for: PHOS        Failed - Vitamin D in normal range and within 360 days    25-Hydroxy, Vitamin D-3  Date Value Ref Range Status  08/08/2018 20 ng/mL Final   25-Hydroxy, Vitamin D-2  Date Value Ref Range Status  08/08/2018 <1.0 ng/mL Final   25-Hydroxy, Vitamin D  Date Value Ref Range Status  08/08/2018 20 (L) ng/mL Final    Comment:    Reference Range: All Ages: Target levels 30 - 100           Passed - Ca in normal range and within 360 days    Calcium  Date Value Ref Range Status  03/01/2020 9.4 8.7 - 10.3 mg/dL Final          Passed - Valid encounter within last 12 months    Recent Outpatient Visits           2 months ago Lower leg edema   [facility] Ro[facility]velt Maser [place], [place]   3 months ago Essential hypertension   [facility] Roosvelt Maser [place], [place]   4 months ago Osteoarthritis of knee (Left)   [facility] [name], [place]   5 months ago Fibromyalgia   [facility] Roosvelt Maser [place], [place]   6 months ago Acute non-recurrent maxillary sinusitis   [facility] [name], [place]       Future Appointments             In 1 week Laural Benes, Oralia Rud, DO [employer]   In 1 month Laural Benes, Oralia Rud, DO [employer]

## 2020-06-07 ENCOUNTER — Telehealth: Payer: Self-pay

## 2020-06-10 NOTE — Progress Notes (Signed)
PROVIDER NOTE: Information contained herein reflects review and annotations entered in association with encounter. Interpretation of such information and data should be left to medically-trained personnel. Information provided to patient can be located elsewhere in the medical record under "Patient Instructions". Document created using STT-dictation technology, any transcriptional errors that may result from process are unintentional.    Patient: Cynthia Jones  Service Category: Procedure  Provider: Oswaldo Done, MD  DOB: 03-20-1941  DOS: 06/11/2020  Location: [facility]  MRN: [id number]  Setting: Ambulatory - outpatient  Referring Provider: [name],*  Type: Established Patient  Specialty: Interventional Pain Management  PCP: [name], PA-C   Primary Reason for Visit: Interventional Pain Management Treatment. CC: Knee Pain (left)  Procedure:          Anesthesia, Analgesia, Anxiolysis:  Type: Therapeutic Intra-Articular Hyalgan Knee Injection #2/S2  Region: Lateral infrapatellar Knee Region Level: Knee Joint Laterality: Left knee  Type: Local Anesthesia Indication(s): Analgesia         Local Anesthetic: Lidocaine 1-2% Route: Infiltration ([place]/IM) IV Access: Declined Sedation: Declined   Position: Sitting   Indications: 1. Chronic knee pain (1ry area of Pain) (Bilateral) (L>R)   2. Chronic knee pain (Left)   3. Osteoarthritis of knee (Left)    Pain Score: Pre-procedure: 7 /10 Post-procedure: 0-No pain/10   Post-Procedure Evaluation  Procedure (05/29/2020): Therapeutic left knee intra-articular Hyalgan injection #1/series 2, without fluoroscopy or IV sedation Pre-procedure pain level: 7/10 Post-procedure: 0/10 (100% relief)  Sedation: None.  Effectiveness during initial hour after procedure(Ultra-Short Term Relief): 0 %.  (Report is not compatible with pain score recorded immediately after procedure)  Local anesthetic used:  Long-acting (4-6 hours) Effectiveness: Defined as any analgesic benefit obtained secondary to the administration of local anesthetics. This carries significant diagnostic value as to the etiological location, or anatomical origin, of the pain. Duration of benefit is expected to coincide with the duration of the local anesthetic used.  Effectiveness during initial 4-6 hours after procedure(Short-Term Relief): 0 %.  Long-term benefit: Defined as any relief past the pharmacologic duration of the local anesthetics.  Effectiveness past the initial 6 hours after procedure(Long-Term Relief): 10 %.  Current benefits: Defined as benefit that persist at this time.   Analgesia:  <50% better Function: Back to baseline ROM: Back to baseline  Pre-op Assessment:  Ms. Vokes is a 79 y.o. (year old), female patient, seen today for interventional treatment. She  has a past surgical history that includes Parotidectomy (Left); Appendectomy; Cholecystectomy; Abdominal hysterectomy; Tonsillectomy; Joint replacement (2014); Eye surgery; and Breast cyst aspiration (Right, 04/18/2013). Ms. Tessler has a current medication list which includes the following prescription(s): aripiprazole, b complex vitamins, carvedilol, cyclobenzaprine, diclofenac sodium, duloxetine, esomeprazole, hydrochlorothiazide, levothyroxine, losartan, nystatin, pregabalin, UNABLE TO FIND, UNABLE TO FIND, vitamin d (ergocalciferol), and tramadol. Her primarily concern today is the Knee Pain (left)  Initial Vital Signs:  Pulse/HCG Rate: 65ECG Heart Rate: 64 Temp: (!) 97.5 F (36.4 C) Resp:   BP: 136/68 SpO2: 98 %  BMI: Estimated body mass index is 43.36 kg/m as calculated from the following:   Height as of this encounter: 5' (1.524 m).   Weight as of this encounter: 222 lb (100.7 kg).  Risk Assessment: Allergies: Reviewed. She is allergic to nsaids and erythromycin.  Allergy Precautions: None required Coagulopathies: Reviewed. None  identified.  Blood-thinner therapy: None at this time Active Infection(s): Reviewed. None identified. Ms. Tetro is afebrile  Site Confirmation: Ms. Lansky was asked to confirm  the procedure and laterality before marking the site Procedure checklist: Completed Consent: Before the procedure and under the influence of no sedative(s), amnesic(s), or anxiolytics, the patient was informed of the treatment options, risks and possible complications. To fulfill our ethical and legal obligations, as recommended by the American Medical Association's Code of Ethics, I have informed the patient of my clinical impression; the nature and purpose of the treatment or procedure; the risks, benefits, and possible complications of the intervention; the alternatives, including doing nothing; the risk(s) and benefit(s) of the alternative treatment(s) or procedure(s); and the risk(s) and benefit(s) of doing nothing. The patient was provided information about the general risks and possible complications associated with the procedure. These may include, but are not limited to: failure to achieve desired goals, infection, bleeding, organ or nerve damage, allergic reactions, paralysis, and death. In addition, the patient was informed of those risks and complications associated to the procedure, such as failure to decrease pain; infection; bleeding; organ or nerve damage with subsequent damage to sensory, motor, and/or autonomic systems, resulting in permanent pain, numbness, and/or weakness of one or several areas of the body; allergic reactions; (i.e.: anaphylactic reaction); and/or death. Furthermore, the patient was informed of those risks and complications associated with the medications. These include, but are not limited to: allergic reactions (i.e.: anaphylactic or anaphylactoid reaction(s)); adrenal axis suppression; blood sugar elevation that in diabetics may result in ketoacidosis or comma; water retention that in  patients with history of congestive heart failure may result in shortness of breath, pulmonary edema, and decompensation with resultant heart failure; weight gain; swelling or edema; medication-induced neural toxicity; particulate matter embolism and blood vessel occlusion with resultant organ, and/or nervous system infarction; and/or aseptic necrosis of one or more joints. Finally, the patient was informed that Medicine is not an exact science; therefore, there is also the possibility of unforeseen or unpredictable risks and/or possible complications that may result in a catastrophic outcome. The patient indicated having understood very clearly. We have given the patient no guarantees and we have made no promises. Enough time was given to the patient to ask questions, all of which were answered to the patient's satisfaction. Ms. Kanaan has indicated that she wanted to continue with the procedure. Attestation: I, the ordering provider, attest that I have discussed with the patient the benefits, risks, side-effects, alternatives, likelihood of achieving goals, and potential problems during recovery for the procedure that I have provided informed consent. Date  Time: 06/11/2020 11:56 AM  Pre-Procedure Preparation:  Monitoring: As per clinic protocol. Respiration, ETCO2, SpO2, BP, heart rate and rhythm monitor placed and checked for adequate function Safety Precautions: Patient was assessed for positional comfort and pressure points before starting the procedure. Time-out: I initiated and conducted the "Time-out" before starting the procedure, as per protocol. The patient was asked to participate by confirming the accuracy of the "Time Out" information. Verification of the correct person, site, and procedure were performed and confirmed by me, the nursing staff, and the patient. "Time-out" conducted as per Joint Commission's Universal Protocol (UP.01.01.01). Time: 1217  Description of Procedure:           Target Area: Knee Joint Approach: Just above the Lateral tibial plateau, lateral to the infrapatellar tendon. Area Prepped: Entire knee area, from the mid-thigh to the mid-shin. DuraPrep (Iodine Povacrylex [0.7% available iodine] and Isopropyl Alcohol, 74% w/w) Safety Precautions: Aspiration looking for blood return was conducted prior to all injections. At no point did we inject any substances, as  a needle was being advanced. No attempts were made at seeking any paresthesias. Safe injection practices and needle disposal techniques used. Medications properly checked for expiration dates. SDV (single dose vial) medications used. Description of the Procedure: Protocol guidelines were followed. The patient was placed in position over the fluoroscopy table. The target area was identified and the area prepped in the usual manner. Skin & deeper tissues infiltrated with local anesthetic. Appropriate amount of time allowed to pass for local anesthetics to take effect. The procedure needles were then advanced to the target area. Proper needle placement secured. Negative aspiration confirmed. Solution injected in intermittent fashion, asking for systemic symptoms every 0.5cc of injectate. The needles were then removed and the area cleansed, making sure to leave some of the prepping solution back to take advantage of its long term bactericidal properties. Vitals:   06/11/20 1153 06/11/20 1228  BP: 136/68 132/70  Pulse: 65   Temp: (!) 97.5 F (36.4 C)   SpO2: 98% 98%  Weight: 222 lb (100.7 kg)   Height: 5' (1.524 m)     Start Time: 1218 hrs. End Time: 1220 hrs. Materials:  Needle(s) Type: Regular needle Gauge: 25G Length: 1.5-in Medication(s): Please see orders for medications and dosing details.  Imaging Guidance:          Type of Imaging Technique: None used Indication(s): N/A Exposure Time: No patient exposure Contrast: None used. Fluoroscopic Guidance: N/A Ultrasound Guidance:  N/A Interpretation: N/A  Antibiotic Prophylaxis:   Anti-infectives (From admission, onward)   None     Indication(s): None identified  Post-operative Assessment:  Post-procedure Vital Signs:  Pulse/HCG Rate: 6564 Temp: (!) 97.5 F (36.4 C) Resp:   BP: 132/70 SpO2: 98 %  EBL: None  Complications: No immediate post-treatment complications observed by team, or reported by patient.  Note: The patient tolerated the entire procedure well. A repeat set of vitals were taken after the procedure and the patient was kept under observation following institutional policy, for this type of procedure. Post-procedural neurological assessment was performed, showing return to baseline, prior to discharge. The patient was provided with post-procedure discharge instructions, including a section on how to identify potential problems. Should any problems arise concerning this procedure, the patient was given instructions to immediately contact us, at any time, without hesitation. In any case, we plan to contact the patient by telephone for a follow-up status report regarding this interventional procedure.  Comments:  No additional relevant information.  Plan of Care  Orders:  Orders Placed This Encounter  Procedures  . KNEE INJECTION    Hyalgan knee injection to be done by MD.    Scheduling Instructions:     Procedure: Intra-articular Hyalgan Knee injection #2     Side(s): Left Knee     Sedation: None     Timeframe: Today    Order Specific Question:   Where will this procedure be performed?    Answer:   [facility]  . KNEE INJECTION    Hyalgan knee injection. Please order Hyalgan.    Standing Status:   Future    Standing Expiration Date:   07/12/2020    Scheduling Instructions:     Procedure: Intra-articular Hyalgan Knee injection #3     Side: Left-sided     Sedation: None     Timeframe: in two (2) weeks    Order Specific Question:   Where will this procedure be performed?     Answer:   [facility]  . ToxASSURE Select  13 (MW), Urine    Volume: 30 ml(s). Minimum 3 ml of urine is needed. Document temperature of fresh sample. Indications: Long term (current) use of opiate analgesic (Z61.096)    Order Specific Question:   Release to patient    Answer:   Immediate  . Informed Consent Details: Physician/Practitioner Attestation; Transcribe to consent form and obtain patient signature    Provider Attestation: I, Adaja Wander A. Laban Emperor, MD, (Pain Management Specialist), the physician/practitioner, attest that I have discussed with the patient the benefits, risks, side effects, alternatives, likelihood of achieving goals and potential problems during recovery for the procedure that I have provided informed consent.    Scheduling Instructions:     Note: Always confirm laterality of pain with Ms. Knouff, before procedure.     Transcribe to consent form and obtain patient signature.    Order Specific Question:   Physician/Practitioner attestation of informed consent for procedure/surgical case    Answer:   I, the physician/practitioner, attest that I have discussed with the patient the benefits, risks, side effects, alternatives, likelihood of achieving goals and potential problems during recovery for the procedure that I have provided informed consent.    Order Specific Question:   Procedure    Answer:   Therapeutic, left sided, intra-articular Hyalgan knee injection    Order Specific Question:   Physician/Practitioner performing the procedure    Answer:   Rayonna Heldman A. Laban Emperor, MD    Order Specific Question:   Indication/Reason    Answer:   Chronic left knee pain secondary to primary osteoarthritis of the knee  . Provide equipment / supplies at bedside    "Block Tray" (Disposable  single use) Needle type: Regular Amount/quantity: 1 Size: 1.5-inch Gauge: 25G    Standing Status:   Standing    Number of Occurrences:   1    Order Specific Question:   Specify     Answer:   Block Tray  . Nursing Instructions:    1). STAT: UDS required today. 2). Make sure to document all opioids and benzodiazepines taken, including time of last intake. 3). If order is entered on a procedure day, make sure sample is obtained before any medications are administered.  . Nursing Instructions:    1. Medication Agreement: Please go over agreement with the patient. Have the patient read and sign the agreement. Provide patient with a copy of the signed agreement. 2. Make sure that the patient has completed the ORT (Opioid Risk Tool). 3. Provide the patient with a copy of our "Medicatiion Policy", "Medication Recommendations and Reminders", and "CBD information". 4. Remind the patient to always bring their medications and medication bottles (even if empty) to all appointments except for procedure appointments.    Scheduling Instructions:     Sign "Medication Agreement", complete "Opioid Risk Tool", inform patient of our practice "Medication Policies" (Pill counts, always bring bottles, except on procedure days).  . Nursing communication    Scheduling Instructions:     Complete/update the opioid risk tool (ORT) questionnaire.   Chronic Opioid Analgesic:  No opioid analgesics prescribed by our practice.  At one time, I did prescribe some oxycodone for her, but she is currently not using it.  She did mention to me that she has been using the CBD Gummies.  I took the time to explain to her the medical legal issues associated with that and the possibility that she may test positive on a UDS for carboxy-THC. MME/day:0mg /day.   Medications ordered for procedure: Meds ordered this encounter  Medications  . lidocaine (PF) (XYLOCAINE) 1 % injection 5 mL  . ropivacaine (PF) 2 mg/mL (0.2%) (NAROPIN) injection 5 mL  . Sodium Hyaluronate SOSY 2 mL  . traMADol (ULTRAM) 50 MG tablet    Sig: Take 1 tablet (50 mg total) by mouth 3 (three) times daily as needed for severe pain. Must last 30  days    Dispense:  90 tablet    Refill:  0    Chronic Pain: STOP Act (Not applicable) Fill 1 day early if closed on refill date. Avoid benzodiazepines within 8 hours of opioids   Medications administered: We administered lidocaine (PF), ropivacaine (PF) 2 mg/mL (0.2%), and Sodium Hyaluronate.  See the medical record for exact dosing, route, and time of administration.  Follow-up plan:   Return in about 2 weeks (around 06/25/2020) for Procedure (no sedation): (L) KNEE Hyalgan #3.       Interventional treatment options: Planned, scheduled, and/or pending:   No further increases on opioid analgesics    Under consideration:   Possible right Genicular nerve RFA  Diagnostic left Genicular NB Possibleleft Genicular nerve RFA Possible bilateral lumbar facet RFA Diagnostic left LESI Diagnostic caudal ESI + epidurogram Possible Racz procedure Diagnostic bilateral cervical facet block Possible bilateral cervical facet RFA Diagnostic right versus left CESI Diagnostic bilateral IA shoulder joint injection Diagnostic bilateral suprascapular NB Possible bilateral suprascapular nerve RFA   Therapeutic/palliative (PRN):  Diagnostic right Genicular NB #2(90/90/50)  Diagnosticleft IA knee joint injection #2(100/100/90)  Palliativeleft IA Hyalgan Knee injection S3N1(last completed on 11/01/2018)  Diagnostic bilateral lumbar facet block #2    Recent Visits Date Type Provider Dept  05/16/20 Procedure visit Delano Metz, MD [facility]  03/28/20 Telemedicine Delano Metz, MD [facility]  Showing recent visits within past 90 days and meeting all other requirements Today's Visits Date Type Provider Dept  06/11/20 Procedure visit Delano Metz, MD [facility]  Showing today's visits and meeting all other requirements Future Appointments No visits were found meeting these conditions. Showing future appointments within next 90 days  and meeting all other requirements  Disposition: Discharge home  Discharge (Date  Time): 06/11/2020; 1233 (Urine was obtained, medication agreement was signed,) hrs.   Primary Care Physician: [name], PA-C Location: [facility] Note by: Oswaldo Done, MD Date: 06/11/2020; Time: 12:51 PM  Disclaimer:  Medicine is not an Visual merchandiser. The only guarantee in medicine is that nothing is guaranteed. It is important to note that the decision to proceed with this intervention was based on the information collected from the patient. The Data and conclusions were drawn from the patient's questionnaire, the interview, and the physical examination. Because the information was provided in large part by the patient, it cannot be guaranteed that it has not been purposely or unconsciously manipulated. Every effort has been made to obtain as much relevant data as possible for this evaluation. It is important to note that the conclusions that lead to this procedure are derived in large part from the available data. Always take into account that the treatment will also be dependent on availability of resources and existing treatment guidelines, considered by other Pain Management Practitioners as being common knowledge and practice, at the time of the intervention. For Medico-Legal purposes, it is also important to point out that variation in procedural techniques and pharmacological choices are the acceptable norm. The indications, contraindications, technique, and results of the above procedure should only be interpreted and judged by a  Board-Certified Interventional Pain Specialist with extensive familiarity and expertise in the same exact procedure and technique.

## 2020-06-11 ENCOUNTER — Ambulatory Visit: Payer: Medicare HMO | Attending: Pain Medicine | Admitting: Pain Medicine

## 2020-06-11 ENCOUNTER — Other Ambulatory Visit: Payer: Self-pay

## 2020-06-11 ENCOUNTER — Encounter: Payer: Self-pay | Admitting: Pain Medicine

## 2020-06-11 VITALS — BP 132/70 | HR 65 | Temp 97.5°F | Ht 60.0 in | Wt 222.0 lb

## 2020-06-11 DIAGNOSIS — M25562 Pain in left knee: Secondary | ICD-10-CM | POA: Diagnosis not present

## 2020-06-11 DIAGNOSIS — M25561 Pain in right knee: Secondary | ICD-10-CM | POA: Insufficient documentation

## 2020-06-11 DIAGNOSIS — M1712 Unilateral primary osteoarthritis, left knee: Secondary | ICD-10-CM | POA: Diagnosis not present

## 2020-06-11 DIAGNOSIS — G894 Chronic pain syndrome: Secondary | ICD-10-CM | POA: Diagnosis not present

## 2020-06-11 DIAGNOSIS — Z79899 Other long term (current) drug therapy: Secondary | ICD-10-CM | POA: Diagnosis not present

## 2020-06-11 DIAGNOSIS — G8929 Other chronic pain: Secondary | ICD-10-CM | POA: Insufficient documentation

## 2020-06-11 MED ORDER — ROPIVACAINE HCL 2 MG/ML IJ SOLN
5.0000 mL | Freq: Once | INTRAMUSCULAR | Status: AC
  Administered 2020-06-11: 5 mL via INTRA_ARTICULAR
  Filled 2020-06-11: qty 10

## 2020-06-11 MED ORDER — TRAMADOL HCL 50 MG PO TABS: 50.0000 mg | ORAL_TABLET | Freq: Three times a day (TID) | ORAL | 0 refills | Status: DC | PRN

## 2020-06-11 MED ORDER — LIDOCAINE HCL (PF) 1 % IJ SOLN
5.0000 mL | Freq: Once | INTRAMUSCULAR | Status: AC
  Administered 2020-06-11: 5 mL
  Filled 2020-06-11: qty 5

## 2020-06-11 MED ORDER — SODIUM HYALURONATE (VISCOSUP) 20 MG/2ML IX SOSY
2.0000 mL | PREFILLED_SYRINGE | Freq: Once | INTRA_ARTICULAR | Status: AC
  Administered 2020-06-11: 2 mL via INTRA_ARTICULAR

## 2020-06-11 NOTE — Progress Notes (Signed)
Safety precautions to be maintained throughout the outpatient stay will include: orient to surroundings, keep bed in low position, maintain call bell within reach at all times, provide assistance with transfer out of bed and ambulation.  

## 2020-06-11 NOTE — Patient Instructions (Addendum)
____________________________________________________________________________________________  Post-Procedure Discharge Instructions  Instructions:  Apply ice:   Purpose: This will minimize any swelling and discomfort after procedure.   When: Day of procedure, as soon as you get home.  How: Fill a plastic sandwich bag with crushed ice. Cover it with a small towel and apply to injection site.  How long: (15 min on, 15 min off) Apply for 15 minutes then remove x 15 minutes.  Repeat sequence on day of procedure, until you go to bed.  Apply heat:   Purpose: To treat any soreness and discomfort from the procedure.  When: Starting the next day after the procedure.  How: Apply heat to procedure site starting the day following the procedure.  How long: May continue to repeat daily, until discomfort goes away.  Food intake: Start with clear liquids (like water) and advance to regular food, as tolerated.   Physical activities: Keep activities to a minimum for the first 8 hours after the procedure. After that, then as tolerated.  Driving: If you have received any sedation, be responsible and do not drive. You are not allowed to drive for 24 hours after having sedation.  Blood thinner: (Applies only to those taking blood thinners) You may restart your blood thinner 6 hours after your procedure.  Insulin: (Applies only to Diabetic patients taking insulin) As soon as you can eat, you may resume your normal dosing schedule.  Infection prevention: Keep procedure site clean and dry. Shower daily and clean area with soap and water.  Post-procedure Pain Diary: Extremely important that this be done correctly and accurately. Recorded information will be used to determine the next step in treatment. For the purpose of accuracy, follow these rules:  Evaluate only the area treated. Do not report or include pain from an untreated area. For the purpose of this evaluation, ignore all other areas of pain,  except for the treated area.  After your procedure, avoid taking a long nap and attempting to complete the pain diary after you wake up. Instead, set your alarm clock to go off every hour, on the hour, for the initial 8 hours after the procedure. Document the duration of the numbing medicine, and the relief you are getting from it.  Do not go to sleep and attempt to complete it later. It will not be accurate. If you received sedation, it is likely that you were given a medication that may cause amnesia. Because of this, completing the diary at a later time may cause the information to be inaccurate. This information is needed to plan your care.  Follow-up appointment: Keep your post-procedure follow-up evaluation appointment after the procedure (usually 2 weeks for most procedures, 6 weeks for radiofrequencies). DO NOT FORGET to bring you pain diary with you.   Expect: (What should I expect to see with my procedure?)  From numbing medicine (AKA: Local Anesthetics): Numbness or decrease in pain. You may also experience some weakness, which if present, could last for the duration of the local anesthetic.  Onset: Full effect within 15 minutes of injected.  Duration: It will depend on the type of local anesthetic used. On the average, 1 to 8 hours.   From steroids (Applies only if steroids were used): Decrease in swelling or inflammation. Once inflammation is improved, relief of the pain will follow.  Onset of benefits: Depends on the amount of swelling present. The more swelling, the longer it will take for the benefits to be seen. In some cases, up to 10 days.    Duration: Steroids will stay in the system x 2 weeks. Duration of benefits will depend on multiple posibilities including persistent irritating factors.  Side-effects: If present, they may typically last 2 weeks (the duration of the steroids).  Frequent: Cramps (if they occur, drink Gatorade and take over-the-counter Magnesium 450-500 mg  once to twice a day); water retention with temporary weight gain; increases in blood sugar; decreased immune system response; increased appetite.  Occasional: Facial flushing (red, warm cheeks); mood swings; menstrual changes.  Uncommon: Long-term decrease or suppression of natural hormones; bone thinning. (These are more common with higher doses or more frequent use. This is why we prefer that our patients avoid having any injection therapies in other practices.)   Very Rare: Severe mood changes; psychosis; aseptic necrosis.  From procedure: Some discomfort is to be expected once the numbing medicine wears off. This should be minimal if ice and heat are applied as instructed.  Call if: (When should I call?)  You experience numbness and weakness that gets worse with time, as opposed to wearing off.  New onset bowel or bladder incontinence. (Applies only to procedures done in the spine)  Emergency Numbers:  Durning business hours (Monday - Thursday, 8:00 AM - 4:00 PM) (Friday, 9:00 AM - 12:00 Noon): [phone]  After hours: [phone]  NOTE: If you are having a problem and are unable connect with, or to talk to a provider, then go to your nearest urgent care or emergency department. If the problem is serious and urgent, please call 911. ____________________________________________________________________________________________   ____________________________________________________________________________________________  Medication Rules  Purpose: To inform patients, and their family members, of our rules and regulations.  Applies to: All patients receiving prescriptions (written or electronic).  Pharmacy of record: Pharmacy where electronic prescriptions will be sent. If written prescriptions are taken to a different pharmacy, please inform the nursing staff. The pharmacy listed in the electronic medical record should be the one where you would like electronic prescriptions  to be sent.  Electronic prescriptions: In compliance with the [place] Strengthen Opioid Misuse Prevention (STOP) Act of 2017 (Session Law 2017-74/H243), effective August 31, 2018, all controlled substances must be electronically prescribed. Calling prescriptions to the pharmacy will cease to exist.  Prescription refills: Only during scheduled appointments. Applies to all prescriptions.  NOTE: The following applies primarily to controlled substances (Opioid* Pain Medications).   Type of encounter (visit): For patients receiving controlled substances, face-to-face visits are required. (Not an option or up to the patient.)  Patient's responsibilities: 1. Pain Pills: Bring all pain pills to every appointment (except for procedure appointments). 2. Pill Bottles: Bring pills in original pharmacy bottle. Always bring the newest bottle. Bring bottle, even if empty. 3. Medication refills: You are responsible for knowing and keeping track of what medications you take and those you need refilled. The day before your appointment: write a list of all prescriptions that need to be refilled. The day of the appointment: give the list to the admitting nurse. Prescriptions will be written only during appointments. No prescriptions will be written on procedure days. If you forget a medication: it will not be "Called in", "Faxed", or "electronically sent". You will need to get another appointment to get these prescribed. No early refills. Do not call asking to have your prescription filled early. 4. Prescription Accuracy: You are responsible for carefully inspecting your prescriptions before leaving our office. Have the discharge nurse carefully go over each prescription with you, before taking them home. Make sure that   your name is accurately spelled, that your address is correct. Check the name and dose of your medication to make sure it is accurate. Check the number of pills, and the written instructions to  make sure they are clear and accurate. Make sure that you are given enough medication to last until your next medication refill appointment. 5. Taking Medication: Take medication as prescribed. When it comes to controlled substances, taking less pills or less frequently than prescribed is permitted and encouraged. Never take more pills than instructed. Never take medication more frequently than prescribed.  6. Inform other Doctors: Always inform, all of your healthcare providers, of all the medications you take. 7. Pain Medication from other Providers: You are not allowed to accept any additional pain medication from any other Doctor or Healthcare provider. There are two exceptions to this rule. (see below) In the event that you require additional pain medication, you are responsible for notifying us, as stated below. 8. Medication Agreement: You are responsible for carefully reading and following our Medication Agreement. This must be signed before receiving any prescriptions from our practice. Safely store a copy of your signed Agreement. Violations to the Agreement will result in no further prescriptions. (Additional copies of our Medication Agreement are available upon request.) 9. Laws, Rules, & Regulations: All patients are expected to follow all Federal and State Laws, Statutes, Rules, & Regulations. Ignorance of the Laws does not constitute a valid excuse.  10. Illegal drugs and Controlled Substances: The use of illegal substances (including, but not limited to marijuana and its derivatives) and/or the illegal use of any controlled substances is strictly prohibited. Violation of this rule may result in the immediate and permanent discontinuation of any and all prescriptions being written by our practice. The use of any illegal substances is prohibited. 11. Adopted CDC guidelines & recommendations: Target dosing levels will be at or below 60 MME/day. Use of benzodiazepines** is not  recommended.  Exceptions: There are only two exceptions to the rule of not receiving pain medications from other Healthcare Providers. 1. Exception #1 (Emergencies): In the event of an emergency (i.e.: accident requiring emergency care), you are allowed to receive additional pain medication. However, you are responsible for: As soon as you are able, call our office [phone], at any time of the day or night, and leave a message stating your name, the date and nature of the emergency, and the name and dose of the medication prescribed. In the event that your call is answered by a member of our staff, make sure to document and save the date, time, and the name of the person that took your information.  2. Exception #2 (Planned Surgery): In the event that you are scheduled by another doctor or dentist to have any type of surgery or procedure, you are allowed (for a period no longer than 30 days), to receive additional pain medication, for the acute post-op pain. However, in this case, you are responsible for picking up a copy of our "Post-op Pain Management for Surgeons" handout, and giving it to your surgeon or dentist. This document is available at our office, and does not require an appointment to obtain it. Simply go to our office during business hours (Monday-Thursday from 8:00 AM to 4:00 PM) (Friday 8:00 AM to 12:00 Noon) or if you have a scheduled appointment with us, prior to your surgery, and ask for it by name. In addition, you are responsible for: calling our office [phone], at any time of   the day or night, and leaving a message stating your name, name of your surgeon, type of surgery, and date of procedure or surgery. Failure to comply with your responsibilities may result in termination of therapy involving the controlled substances.  *Opioid medications include: morphine, codeine, oxycodone, oxymorphone, hydrocodone, hydromorphone, meperidine, tramadol, tapentadol, buprenorphine,  fentanyl, methadone. **Benzodiazepine medications include: diazepam (Valium), alprazolam (Xanax), clonazepam (Klonopine), lorazepam (Ativan), clorazepate (Tranxene), chlordiazepoxide (Librium), estazolam (Prosom), oxazepam (Serax), temazepam (Restoril), triazolam (Halcion) (Last updated: 05/07/2020) ____________________________________________________________________________________________   ____________________________________________________________________________________________  Medication Recommendations and Reminders  Applies to: All patients receiving prescriptions (written and/or electronic).  Medication Rules & Regulations: These rules and regulations exist for your safety and that of others. They are not flexible and neither are we. Dismissing or ignoring them will be considered "non-compliance" with medication therapy, resulting in complete and irreversible termination of such therapy. (See document titled "Medication Rules" for more details.) In all conscience, because of safety reasons, we cannot continue providing a therapy where the patient does not follow instructions.  Pharmacy of record:   Definition: This is the pharmacy where your electronic prescriptions will be sent.   We do not endorse any particular pharmacy, however, we have experienced problems with [facility] not securing enough medication supply for the community.  We do not restrict you in your choice of pharmacy. However, once we write for your prescriptions, we will NOT be re-sending more prescriptions to fix restricted supply problems created by your pharmacy, or your insurance.   The pharmacy listed in the electronic medical record should be the one where you want electronic prescriptions to be sent.  If you choose to change pharmacy, simply notify our nursing staff.  Recommendations:  Keep all of your pain medications in a safe place, under lock and key, even if you live alone. We will NOT replace lost,  stolen, or damaged medication.  After you fill your prescription, take 1 week's worth of pills and put them away in a safe place. You should keep a separate, properly labeled bottle for this purpose. The remainder should be kept in the original bottle. Use this as your primary supply, until it runs out. Once it's gone, then you know that you have 1 week's worth of medicine, and it is time to come in for a prescription refill. If you do this correctly, it is unlikely that you will ever run out of medicine.  To make sure that the above recommendation works, it is very important that you make sure your medication refill appointments are scheduled at least 1 week before you run out of medicine. To do this in an effective manner, make sure that you do not leave the office without scheduling your next medication management appointment. Always ask the nursing staff to show you in your prescription , when your medication will be running out. Then arrange for the receptionist to get you a return appointment, at least 7 days before you run out of medicine. Do not wait until you have 1 or 2 pills left, to come in. This is very poor planning and does not take into consideration that we may need to cancel appointments due to bad weather, sickness, or emergencies affecting our staff.  DO NOT ACCEPT A "Partial Fill": If for any reason your pharmacy does not have enough pills/tablets to completely fill or refill your prescription, do not allow for a "partial fill". The law allows the pharmacy to complete that prescription within 72 hours, without requiring a new prescription. If they   do not fill the rest of your prescription within those 72 hours, you will need a separate prescription to fill the remaining amount, which we will NOT provide. If the reason for the partial fill is your insurance, you will need to talk to the pharmacist about payment alternatives for the remaining tablets, but again, DO NOT ACCEPT A PARTIAL FILL,  unless you can trust your pharmacist to obtain the remainder of the pills within 72 hours.  Prescription refills and/or changes in medication(s):   Prescription refills, and/or changes in dose or medication, will be conducted only during scheduled medication management appointments. (Applies to both, written and electronic prescriptions.)  No refills on procedure days. No medication will be changed or started on procedure days. No changes, adjustments, and/or refills will be conducted on a procedure day. Doing so will interfere with the diagnostic portion of the procedure.  No phone refills. No medications will be "called into the pharmacy".  No Fax refills.  No weekend refills.  No Holliday refills.  No after hours refills.  Remember:  Business hours are:  Monday to Thursday 8:00 AM to 4:00 PM Provider's Schedule: Delano Metz, MD - Appointments are:  Medication management: Monday and Wednesday 8:00 AM to 4:00 PM Procedure day: Tuesday and Thursday 7:30 AM to 4:00 PM Edward Jolly, MD - Appointments are:  Medication management: Tuesday and Thursday 8:00 AM to 4:00 PM Procedure day: Monday and Wednesday 7:30 AM to 4:00 PM (Last update: 03/20/2020) ____________________________________________________________________________________________   ____________________________________________________________________________________________  CBD (cannabidiol) WARNING  Applicable to: All individuals currently taking or considering taking CBD (cannabidiol) and, more important, all patients taking opioid analgesic controlled substances (pain medication). (Example: oxycodone; oxymorphone; hydrocodone; hydromorphone; morphine; methadone; tramadol; tapentadol; fentanyl; buprenorphine; butorphanol; dextromethorphan; meperidine; codeine; etc.)  Legal status: CBD remains a Schedule I drug prohibited for any use. CBD is illegal with one exception. In the [place], CBD has a limited Retail banker (FDA) approval for the treatment of two specific types of epilepsy disorders. Only one CBD product has been approved by the FDA for this purpose: "Epidiolex". FDA is aware that some companies are marketing products containing cannabis and cannabis-derived compounds in ways that violate the [employer], Drug and Cosmetic Act [facility] Act) and that may put the health and safety of consumers at risk. The FDA, a Federal agency, has not enforced the CBD status since 2018.   Legality: Some manufacturers ship CBD products nationally, which is illegal. Often such products are sold online and are therefore available throughout the country. CBD is openly sold in head shops and health food stores in some states where such sales have not been explicitly legalized. Selling unapproved products with unsubstantiated therapeutic claims is not only a violation of the law, but also can put patients at risk, as these products have not been proven to be safe or effective. Federal illegality makes it difficult to conduct research on CBD.  Reference: "FDA Regulation of Cannabis and Cannabis-Derived Products, Including Cannabidiol (CBD)" - [employer]  Warning: CBD is not FDA approved and has not undergo the same manufacturing controls as prescription drugs.  This means that the purity and safety of available CBD may be questionable. Most of the time, despite manufacturer's claims, it is contaminated with THC (delta-9-tetrahydrocannabinol - the chemical in marijuana responsible for the "HIGH").  When this is the case, the [facility] contaminant will trigger a positive urine drug screen (UDS) test for Marijuana (carboxy-THC). Because a positive UDS for any illicit substance is  a violation of our medication agreement, your opioid analgesics (pain medicine) may be permanently discontinued.  MORE ABOUT  CBD  General Information: CBD  is a derivative of the Marijuana (cannabis sativa) plant discovered in 23. It is one of the 113 identified substances found in Marijuana. It accounts for up to 40% of the plant's extract. As of 2018, preliminary clinical studies on CBD included research for the treatment of anxiety, movement disorders, and pain. CBD is available and consumed in multiple forms, including inhalation of smoke or vapor, as an aerosol spray, and by mouth. It may be supplied as an oil containing CBD, capsules, dried cannabis, or as a liquid solution. CBD is thought not to be as psychoactive as THC (delta-9-tetrahydrocannabinol - the chemical in marijuana responsible for the "HIGH"). Studies suggest that CBD may interact with different biological target receptors in the body, including cannabinoid and other neurotransmitter receptors. As of 2018 the mechanism of action for its biological effects has not been determined.  Side-effects  Adverse reactions: Dry mouth, diarrhea, decreased appetite, fatigue, drowsiness, malaise, weakness, sleep disturbances, and others.  Drug interactions: CBC may interact with other medications such as blood-thinners. (Last update: 04/06/2020) ____________________________________________________________________________________________

## 2020-06-12 ENCOUNTER — Telehealth: Payer: Self-pay

## 2020-06-12 NOTE — Telephone Encounter (Signed)
Denies any needs at this time. Instructed to call if needed. 

## 2020-06-13 ENCOUNTER — Ambulatory Visit (INDEPENDENT_AMBULATORY_CARE_PROVIDER_SITE_OTHER): Payer: Medicare HMO | Admitting: Family Medicine

## 2020-06-13 ENCOUNTER — Other Ambulatory Visit: Payer: Self-pay

## 2020-06-13 ENCOUNTER — Encounter: Payer: Self-pay | Admitting: Family Medicine

## 2020-06-13 VITALS — BP 159/88 | HR 70 | Temp 98.4°F | Ht 60.0 in | Wt 251.0 lb

## 2020-06-13 DIAGNOSIS — M797 Fibromyalgia: Secondary | ICD-10-CM

## 2020-06-13 DIAGNOSIS — M159 Polyosteoarthritis, unspecified: Secondary | ICD-10-CM

## 2020-06-13 DIAGNOSIS — B372 Candidiasis of skin and nail: Secondary | ICD-10-CM | POA: Diagnosis not present

## 2020-06-13 DIAGNOSIS — E1140 Type 2 diabetes mellitus with diabetic neuropathy, unspecified: Secondary | ICD-10-CM | POA: Diagnosis not present

## 2020-06-13 DIAGNOSIS — R1013 Epigastric pain: Secondary | ICD-10-CM

## 2020-06-13 DIAGNOSIS — G894 Chronic pain syndrome: Secondary | ICD-10-CM | POA: Diagnosis not present

## 2020-06-13 DIAGNOSIS — M8949 Other hypertrophic osteoarthropathy, multiple sites: Secondary | ICD-10-CM

## 2020-06-13 DIAGNOSIS — I10 Essential (primary) hypertension: Secondary | ICD-10-CM

## 2020-06-13 DIAGNOSIS — R2689 Other abnormalities of gait and mobility: Secondary | ICD-10-CM | POA: Diagnosis not present

## 2020-06-13 DIAGNOSIS — R635 Abnormal weight gain: Secondary | ICD-10-CM | POA: Diagnosis not present

## 2020-06-13 DIAGNOSIS — R251 Tremor, unspecified: Secondary | ICD-10-CM

## 2020-06-13 LAB — BAYER DCA HB A1C WAIVED: HB A1C (BAYER DCA - WAIVED): 7 % — ABNORMAL HIGH (ref <7.0)

## 2020-06-13 MED ORDER — PREGABALIN 50 MG PO CAPS
50.0000 mg | ORAL_CAPSULE | Freq: Two times a day (BID) | ORAL | 1 refills | Status: DC
Start: 2020-06-13 — End: 2020-10-31

## 2020-06-13 MED ORDER — SUCRALFATE 1 G PO TABS: 1.0000 g | ORAL_TABLET | Freq: Three times a day (TID) | ORAL | 1 refills | Status: DC

## 2020-06-13 MED ORDER — LOSARTAN POTASSIUM 100 MG PO TABS: 100.0000 mg | ORAL_TABLET | Freq: Every day | ORAL | 1 refills | Status: DC

## 2020-06-13 NOTE — Progress Notes (Signed)
BP (!) 159/88 (BP Location: Left Arm, Patient Position: Sitting)   Pulse 70   Temp 98.4 F (36.9 C) (Oral)   Ht 5' (1.524 m)   Wt 251 lb (113.9 kg)   SpO2 97%   BMI 49.02 kg/m    Subjective:    Patient ID: Cynthia Jones, female    DOB: 02-08-1941, 79 y.o.   MRN: [id number]  HPI: Cynthia Jones is a 79 y.o. female  Chief Complaint  Patient presents with  . Leg Swelling    follow up   . Hypertension  . Medication Problem    pts son has questions about tramadol, pt has been drooling when taking  . Tremors    X2-3 months, right hand    Chronic pain- sees pain management. Just started on tramadol but in combination with her xanax and her muscle relaxer, she was very groggy, so her son took her off of the xanax and the muscle relaxer. She has been much better and feeling more like herself.   Had been on xanax and muscle relaxer religiously and was very sedated. Her son stopped it and she has been much less sedated since then.   Has had shakes for a couple of months, happens with doing things and with resting Has a family history of familial tremor.   Weight gain- has gained 29lbs in the last 6 months, and an additional 11lbs in the last 3 months. Was taken off her lyrica as she thought that that was what was causing her weight loss. Since coming off of it, has gained an additional 11 lbs. Has not been able to move much. Has been eating a lot of ice cream because her stomach has been bothering her and that seems like it coats it.   DIABETES Hypoglycemic episodes:no Polydipsia/polyuria: no Visual disturbance: no Chest pain: no Paresthesias: no Glucose Monitoring: no Taking Insulin?: no Blood Pressure Monitoring: not checking Retinal Examination: Not up to Date Foot Exam: Up to Date Diabetic Education: Completed Pneumovax: Up to Date Influenza: Not up to Date Aspirin: yes  Relevant past medical, surgical, family and social history reviewed and updated as  indicated. Interim medical history since our last visit reviewed. Allergies and medications reviewed and updated.  Review of Systems  Constitutional: Positive for fatigue. Negative for activity change, appetite change, chills, diaphoresis, fever and unexpected weight change.  HENT: Negative.   Respiratory: Positive for shortness of breath. Negative for apnea, cough, choking, chest tightness, wheezing and stridor.   Cardiovascular: Negative.   Gastrointestinal: Negative.   Musculoskeletal: Positive for arthralgias, back pain and myalgias. Negative for gait problem, joint swelling, neck pain and neck stiffness.  Skin: Negative.   Neurological: Positive for tremors. Negative for dizziness, seizures, syncope, facial asymmetry, speech difficulty, weakness, light-headedness, numbness and headaches.  Psychiatric/Behavioral: Negative for agitation, behavioral problems, confusion, decreased concentration, dysphoric mood, hallucinations, self-injury, sleep disturbance and suicidal ideas. The patient is nervous/anxious. The patient is not hyperactive.     Per HPI unless specifically indicated above     Objective:    BP (!) 159/88 (BP Location: Left Arm, Patient Position: Sitting)   Pulse 70   Temp 98.4 F (36.9 C) (Oral)   Ht 5' (1.524 m)   Wt 251 lb (113.9 kg)   SpO2 97%   BMI 49.02 kg/m   Wt Readings from Last 3 Encounters:  06/13/20 251 lb (113.9 kg)  06/11/20 222 lb (100.7 kg)  05/16/20 222 lb (100.7 kg)  Physical Exam Vitals and nursing note reviewed.  Constitutional:      General: She is not in acute distress.    Appearance: Normal appearance. She is obese. She is not ill-appearing, toxic-appearing or diaphoretic.  HENT:     Head: Normocephalic and atraumatic.     Right Ear: External ear normal.     Left Ear: External ear normal.     Nose: Nose normal.     Mouth/Throat:     Mouth: Mucous membranes are moist.     Pharynx: Oropharynx is clear.  Eyes:     General: No scleral  icterus.       Right eye: No discharge.        Left eye: No discharge.     Extraocular Movements: Extraocular movements intact.     Conjunctiva/sclera: Conjunctivae normal.     Pupils: Pupils are equal, round, and reactive to light.  Cardiovascular:     Rate and Rhythm: Normal rate and regular rhythm.     Pulses: Normal pulses.     Heart sounds: Normal heart sounds. No murmur heard.  No friction rub. No gallop.   Pulmonary:     Effort: Pulmonary effort is normal. No respiratory distress.     Breath sounds: Normal breath sounds. No stridor. No wheezing, rhonchi or rales.  Chest:     Chest wall: No tenderness.  Musculoskeletal:        General: Normal range of motion.     Cervical back: Normal range of motion and neck supple.  Skin:    General: Skin is warm and dry.     Capillary Refill: Capillary refill takes less than 2 seconds.     Coloration: Skin is not jaundiced or pale.     Findings: No bruising, erythema, lesion or rash.  Neurological:     General: No focal deficit present.     Mental Status: She is alert and oriented to person, place, and time. Mental status is at baseline.     Comments: Fine tremor of L hand   Psychiatric:        Mood and Affect: Mood normal.        Behavior: Behavior normal.        Thought Content: Thought content normal.        Judgment: Judgment normal.     Results for orders placed or performed in visit on 06/13/20  Bayer DCA Hb A1c Waived  Result Value Ref Range   HB A1C (BAYER DCA - WAIVED) 7.0 (H) <7.0 %      Assessment & Plan:   Problem List Items Addressed This Visit      Endocrine   Diabetes mellitus (HCC) - Primary    Running high with A1c of 7.0. Discussed starting trulicity, but they would like to hold for now. Stop the ice cream and work on her exercise. Recheck 3 months.       Relevant Medications   losartan (COZAAR) 100 MG tablet   Other Relevant Orders   Bayer DCA Hb A1c Waived (Completed)     Musculoskeletal and  Integument   Osteoarthritis (Chronic)    Will get PT out. Call with any concerns. Continue to monitor.       Relevant Orders   Ambulatory referral to Home Health     Other   Fibromyalgia (Chronic)    Will get PT out. Call with any concerns. Continue to monitor. Doing well with her lyrica. Continue her current dose. Refills given today.  Relevant Medications   pregabalin (LYRICA) 50 MG capsule   Other Relevant Orders   Ambulatory referral to Home Health   Chronic pain syndrome (Chronic)    Will get PT out. Call with any concerns. Continue to monitor.       Relevant Medications   pregabalin (LYRICA) 50 MG capsule   Other Relevant Orders   Ambulatory referral to Home Health    Other Visit Diagnoses    Weight gain       Will cut the icecream and really watch her diet. PT to help with exercise. Goal of losing 1-2lbs per week. Call with any concerns.    Tremor       Concern for essential tremor, but will get her into neurology for evaluation. Conitnue to monitor.    Relevant Orders   Ambulatory referral to Neurology   Essential hypertension       Relevant Medications   losartan (COZAAR) 100 MG tablet   Candidal intertrigo       Looking good now. Continue nystatin as needed. Continue to monitor.    Balance problem       Will get PT out. Call with any concerns. Continue to monitor.    Relevant Orders   Ambulatory referral to Home Health   Epigastric pain       Will change from ice cream to carafate. Call with any concerns. Recheck 3 months.        Follow up plan: Return in about 3 months (around 09/13/2020).   >50 minutes spent with patient today

## 2020-06-13 NOTE — Assessment & Plan Note (Addendum)
Will get PT out. Call with any concerns. Continue to monitor. Doing well with her lyrica. Continue her current dose. Refills given today.

## 2020-06-13 NOTE — Assessment & Plan Note (Signed)
Will get PT out. Call with any concerns. Continue to monitor.  

## 2020-06-13 NOTE — Assessment & Plan Note (Signed)
Running high with A1c of 7.0. Discussed starting trulicity, but they would like to hold for now. Stop the ice cream and work on her exercise. Recheck 3 months.

## 2020-06-13 NOTE — Assessment & Plan Note (Signed)
Will get PT out. Call with any concerns. Continue to monitor.

## 2020-06-14 LAB — TOXASSURE SELECT 13 (MW), URINE

## 2020-06-17 ENCOUNTER — Other Ambulatory Visit: Payer: Self-pay | Admitting: Family Medicine

## 2020-06-17 MED ORDER — SUCRALFATE 1 G PO TABS: 1.0000 g | ORAL_TABLET | Freq: Three times a day (TID) | ORAL | 1 refills | Status: DC

## 2020-06-17 NOTE — Telephone Encounter (Signed)
Noted that Rx for Carafate on 06/13/20, per Dr. Laural Benes,  Did not show that pharmacy had confirmed receipt of the electronic transmission.  Will resend at this time.

## 2020-06-17 NOTE — Telephone Encounter (Signed)
Requested Prescriptions  Pending Prescriptions Disp Refills  . sucralfate (CARAFATE) 1 g tablet 360 tablet 1    Sig: Take 1 tablet (1 g total) by mouth 4 (four) times daily -  with meals and at bedtime.     Gastroenterology: Antiacids Passed - 06/17/2020 12:31 PM      Passed - Valid encounter within last 12 months    Recent Outpatient Visits          4 days ago Type 2 diabetes mellitus with diabetic neuropathy, without long-term current use of insulin [facility])   [facility], Addy, DO   2 months ago Lower leg edema   [facility] Roosvelt Maser [place], [place]   3 months ago Essential hypertension   [facility] Maser [place], [place]   4 months ago Osteoarthritis of knee (Left)   [facility] [name], [place]   6 months ago Fibromyalgia   [facility] [name], [place]      Future Appointments            In 2 months Laural Benes, Oralia Rud, DO [employer], [facility]

## 2020-06-17 NOTE — Telephone Encounter (Signed)
Pt saw dr Laural Benes on 06/13/2020 and carafate e-scribed did not go through. Please resend [facility] [address] in [place]

## 2020-06-18 DIAGNOSIS — I10 Essential (primary) hypertension: Secondary | ICD-10-CM | POA: Diagnosis not present

## 2020-06-18 DIAGNOSIS — M797 Fibromyalgia: Secondary | ICD-10-CM | POA: Diagnosis not present

## 2020-06-18 DIAGNOSIS — M8580 Other specified disorders of bone density and structure, unspecified site: Secondary | ICD-10-CM | POA: Diagnosis not present

## 2020-06-18 DIAGNOSIS — G894 Chronic pain syndrome: Secondary | ICD-10-CM | POA: Diagnosis not present

## 2020-06-18 DIAGNOSIS — M7060 Trochanteric bursitis, unspecified hip: Secondary | ICD-10-CM | POA: Diagnosis not present

## 2020-06-18 DIAGNOSIS — M47816 Spondylosis without myelopathy or radiculopathy, lumbar region: Secondary | ICD-10-CM | POA: Diagnosis not present

## 2020-06-18 DIAGNOSIS — E1140 Type 2 diabetes mellitus with diabetic neuropathy, unspecified: Secondary | ICD-10-CM | POA: Diagnosis not present

## 2020-06-18 DIAGNOSIS — B372 Candidiasis of skin and nail: Secondary | ICD-10-CM | POA: Diagnosis not present

## 2020-06-18 DIAGNOSIS — M1712 Unilateral primary osteoarthritis, left knee: Secondary | ICD-10-CM | POA: Diagnosis not present

## 2020-06-19 ENCOUNTER — Telehealth: Payer: Self-pay | Admitting: Family Medicine

## 2020-06-19 DIAGNOSIS — B372 Candidiasis of skin and nail: Secondary | ICD-10-CM | POA: Diagnosis not present

## 2020-06-19 DIAGNOSIS — E1140 Type 2 diabetes mellitus with diabetic neuropathy, unspecified: Secondary | ICD-10-CM | POA: Diagnosis not present

## 2020-06-19 DIAGNOSIS — M1712 Unilateral primary osteoarthritis, left knee: Secondary | ICD-10-CM | POA: Diagnosis not present

## 2020-06-19 DIAGNOSIS — M797 Fibromyalgia: Secondary | ICD-10-CM | POA: Diagnosis not present

## 2020-06-19 DIAGNOSIS — G894 Chronic pain syndrome: Secondary | ICD-10-CM | POA: Diagnosis not present

## 2020-06-19 DIAGNOSIS — M8580 Other specified disorders of bone density and structure, unspecified site: Secondary | ICD-10-CM | POA: Diagnosis not present

## 2020-06-19 DIAGNOSIS — M7060 Trochanteric bursitis, unspecified hip: Secondary | ICD-10-CM | POA: Diagnosis not present

## 2020-06-19 DIAGNOSIS — M47816 Spondylosis without myelopathy or radiculopathy, lumbar region: Secondary | ICD-10-CM | POA: Diagnosis not present

## 2020-06-19 DIAGNOSIS — I10 Essential (primary) hypertension: Secondary | ICD-10-CM | POA: Diagnosis not present

## 2020-06-19 NOTE — Telephone Encounter (Signed)
I am okay with verbal orders being provided to [facility], please call and provide:)

## 2020-06-19 NOTE — Telephone Encounter (Signed)
Called left VM giving verbal orders.  KP 

## 2020-06-19 NOTE — Telephone Encounter (Signed)
Home Health Verbal Orders - Caller/Agency: [place]/ [facility]  Callback Number: [phone] secure vm can be left  Requesting /PT Frequency: 2x's a week for 3 weeks  1 x a week for 3 weeks

## 2020-06-19 NOTE — Telephone Encounter (Signed)
Please Advise.  KP

## 2020-06-21 ENCOUNTER — Telehealth: Payer: Self-pay | Admitting: Family Medicine

## 2020-06-21 DIAGNOSIS — M1712 Unilateral primary osteoarthritis, left knee: Secondary | ICD-10-CM | POA: Diagnosis not present

## 2020-06-21 DIAGNOSIS — I10 Essential (primary) hypertension: Secondary | ICD-10-CM | POA: Diagnosis not present

## 2020-06-21 DIAGNOSIS — M8580 Other specified disorders of bone density and structure, unspecified site: Secondary | ICD-10-CM | POA: Diagnosis not present

## 2020-06-21 DIAGNOSIS — M47816 Spondylosis without myelopathy or radiculopathy, lumbar region: Secondary | ICD-10-CM | POA: Diagnosis not present

## 2020-06-21 DIAGNOSIS — M7060 Trochanteric bursitis, unspecified hip: Secondary | ICD-10-CM | POA: Diagnosis not present

## 2020-06-21 DIAGNOSIS — M797 Fibromyalgia: Secondary | ICD-10-CM | POA: Diagnosis not present

## 2020-06-21 DIAGNOSIS — B372 Candidiasis of skin and nail: Secondary | ICD-10-CM | POA: Diagnosis not present

## 2020-06-21 DIAGNOSIS — G894 Chronic pain syndrome: Secondary | ICD-10-CM | POA: Diagnosis not present

## 2020-06-21 DIAGNOSIS — E1140 Type 2 diabetes mellitus with diabetic neuropathy, unspecified: Secondary | ICD-10-CM | POA: Diagnosis not present

## 2020-06-21 NOTE — Telephone Encounter (Signed)
Routing to provider for verbal orders 

## 2020-06-21 NOTE — Telephone Encounter (Signed)
Called and left [place] a VM letting her know that verbal orders were ok per Dr. Laural Benes.

## 2020-06-21 NOTE — Telephone Encounter (Signed)
Copied from CRM [id number]. Topic: Quick Communication - Home Health Verbal Orders >> Jun 21, 2020 11:20 AM Darron Doom wrote: Caller/Agency: Judeth Cornfield / [facility]  Callback Number: [phone] [facility]  Requesting OT/PT/Skilled Nursing/Social Work/Speech Therapy: OT  Frequency: 1x1, 2x2 and 1x1

## 2020-06-21 NOTE — Telephone Encounter (Signed)
OK for verbal orders?

## 2020-06-24 DIAGNOSIS — M8580 Other specified disorders of bone density and structure, unspecified site: Secondary | ICD-10-CM | POA: Diagnosis not present

## 2020-06-24 DIAGNOSIS — I10 Essential (primary) hypertension: Secondary | ICD-10-CM | POA: Diagnosis not present

## 2020-06-24 DIAGNOSIS — M797 Fibromyalgia: Secondary | ICD-10-CM | POA: Diagnosis not present

## 2020-06-24 DIAGNOSIS — M1712 Unilateral primary osteoarthritis, left knee: Secondary | ICD-10-CM | POA: Diagnosis not present

## 2020-06-24 DIAGNOSIS — G894 Chronic pain syndrome: Secondary | ICD-10-CM | POA: Diagnosis not present

## 2020-06-24 DIAGNOSIS — B372 Candidiasis of skin and nail: Secondary | ICD-10-CM | POA: Diagnosis not present

## 2020-06-24 DIAGNOSIS — E1140 Type 2 diabetes mellitus with diabetic neuropathy, unspecified: Secondary | ICD-10-CM | POA: Diagnosis not present

## 2020-06-24 DIAGNOSIS — M7060 Trochanteric bursitis, unspecified hip: Secondary | ICD-10-CM | POA: Diagnosis not present

## 2020-06-24 DIAGNOSIS — M47816 Spondylosis without myelopathy or radiculopathy, lumbar region: Secondary | ICD-10-CM | POA: Diagnosis not present

## 2020-06-25 ENCOUNTER — Encounter: Payer: Self-pay | Admitting: Pain Medicine

## 2020-06-25 ENCOUNTER — Ambulatory Visit: Payer: Medicare HMO | Attending: Pain Medicine | Admitting: Pain Medicine

## 2020-06-25 VITALS — BP 138/63 | HR 60 | Temp 97.4°F | Resp 18 | Ht 60.0 in | Wt 259.0 lb

## 2020-06-25 DIAGNOSIS — M25561 Pain in right knee: Secondary | ICD-10-CM | POA: Diagnosis not present

## 2020-06-25 DIAGNOSIS — M8580 Other specified disorders of bone density and structure, unspecified site: Secondary | ICD-10-CM | POA: Diagnosis not present

## 2020-06-25 DIAGNOSIS — M1712 Unilateral primary osteoarthritis, left knee: Secondary | ICD-10-CM | POA: Insufficient documentation

## 2020-06-25 DIAGNOSIS — M797 Fibromyalgia: Secondary | ICD-10-CM | POA: Diagnosis not present

## 2020-06-25 DIAGNOSIS — G894 Chronic pain syndrome: Secondary | ICD-10-CM | POA: Diagnosis not present

## 2020-06-25 DIAGNOSIS — G8929 Other chronic pain: Secondary | ICD-10-CM

## 2020-06-25 DIAGNOSIS — B372 Candidiasis of skin and nail: Secondary | ICD-10-CM | POA: Diagnosis not present

## 2020-06-25 DIAGNOSIS — I10 Essential (primary) hypertension: Secondary | ICD-10-CM | POA: Diagnosis not present

## 2020-06-25 DIAGNOSIS — M47816 Spondylosis without myelopathy or radiculopathy, lumbar region: Secondary | ICD-10-CM | POA: Diagnosis not present

## 2020-06-25 DIAGNOSIS — M25562 Pain in left knee: Secondary | ICD-10-CM | POA: Insufficient documentation

## 2020-06-25 DIAGNOSIS — M7060 Trochanteric bursitis, unspecified hip: Secondary | ICD-10-CM | POA: Diagnosis not present

## 2020-06-25 DIAGNOSIS — E1140 Type 2 diabetes mellitus with diabetic neuropathy, unspecified: Secondary | ICD-10-CM | POA: Diagnosis not present

## 2020-06-25 MED ORDER — ROPIVACAINE HCL 2 MG/ML IJ SOLN
5.0000 mL | Freq: Once | INTRAMUSCULAR | Status: AC
  Administered 2020-06-25: 5 mL via INTRA_ARTICULAR

## 2020-06-25 MED ORDER — SODIUM HYALURONATE (VISCOSUP) 20 MG/2ML IX SOSY
2.0000 mL | PREFILLED_SYRINGE | Freq: Once | INTRA_ARTICULAR | Status: AC
  Administered 2020-06-25: 2 mL via INTRA_ARTICULAR

## 2020-06-25 MED ORDER — LIDOCAINE HCL (PF) 1 % IJ SOLN
INTRAMUSCULAR | Status: AC
  Filled 2020-06-25: qty 5

## 2020-06-25 MED ORDER — ROPIVACAINE HCL 2 MG/ML IJ SOLN
INTRAMUSCULAR | Status: AC
  Filled 2020-06-25: qty 10

## 2020-06-25 MED ORDER — LIDOCAINE HCL (PF) 1 % IJ SOLN
5.0000 mL | Freq: Once | INTRAMUSCULAR | Status: AC
  Administered 2020-06-25: 5 mL

## 2020-06-25 NOTE — Progress Notes (Signed)
PROVIDER NOTE: Information contained herein reflects review and annotations entered in association with encounter. Interpretation of such information and data should be left to medically-trained personnel. Information provided to patient can be located elsewhere in the medical record under "Patient Instructions". Document created using STT-dictation technology, any transcriptional errors that may result from process are unintentional.    Patient: Cynthia Jones  Service Category: Procedure  Provider: Oswaldo Done, MD  DOB: October 22, 1940  DOS: 06/25/2020  Location: [facility]  MRN: [id number]  Setting: Ambulatory - outpatient  Referring Provider: Delano Metz, MD  Type: Established Patient  Specialty: Interventional Pain Management  PCP: Cynthia Nearing, PA-C   Primary Reason for Visit: Interventional Pain Management Treatment. CC: Knee Pain (left)  Procedure:          Anesthesia, Analgesia, Anxiolysis:  Type: Therapeutic Intra-Articular Hyalgan Knee Injection #3/S2  Region: Lateral infrapatellar Knee Region Level: Knee Joint Laterality: Left knee  Type: Local Anesthesia Indication(s): Analgesia         Local Anesthetic: Lidocaine 1-2% Route: Infiltration (Cove/IM) IV Access: Declined Sedation: Declined   Position: Sitting   Indications: 1. Chronic knee pain (1ry area of Pain) (Bilateral) (L>R)   2. Chronic knee pain (Left)   3. Osteoarthritis of knee (Left)    Pain Score: Pre-procedure: 8 /10 Post-procedure: 0-No pain/10   Today I had a long conversation with the patient regarding the long-term plan with regards to the left knee.  Today is the second in a series of 2 intra-articular Hyalgan knee injections on the left knee.  After this for first procedure the patient indicated having had no relief with initial 4 to 6 hours, despite the fact that our notes indicate that when she left the clinic she was experiencing 100% relief of the pain.  Today she  comes in and she indicates that it did give her 100% relief of the pain for the initial 4 to 6 hours and then he went down to a 50% relief that lasted 2 days.  In comparison to what she told us on the first visit, it would look like she is improving.  However, her main concern today was that her pain medications are not lasting.  This means that she has not been compliant on how instructed her to take them.  In any case, I have explained to her that should this third injection not provide her with any significant benefit, then it would suggest that she has lost all the cartilage in the knee and therefore the next up would be an MRI.  If the MRI confirms that she has indeed significant osteoarthritis of that knee and has lost the cartilage, then we would need to refer her to an orthopedic surgeon for possible replacement.  She has already had the right knee replaced.  Her daughter indicates that she has gained approximately 30 pounds and this has been a problem for her since her diabetes is out of control, her blood pressure is elevated, and she is however multiple medical problems associated with her weight including this osteoarthritis.  Because of all this, it is very possible that she may not be a candidate for replacement of the knee.  If that is the case, the alternative would be genicular nerve blocks followed by radiofrequency ablation, trying to avoid using the steroids due to her diabetes.  They understood and accepted.  Post-Procedure Evaluation  Procedure (06/11/2020): Therapeutic left intra-articular Hyalgan knee injection #2/series #2, no fluoroscopy or IV sedation  Pre-procedure pain level: 7/10 Post-procedure: 0/10 (100% relief)  Sedation: Please see nurses note.  Effectiveness during initial hour after procedure(Ultra-Short Term Relief): 100 %.  Local anesthetic used: Long-acting (4-6 hours) Effectiveness: Defined as any analgesic benefit obtained secondary to the administration of local  anesthetics. This carries significant diagnostic value as to the etiological location, or anatomical origin, of the pain. Duration of benefit is expected to coincide with the duration of the local anesthetic used.  Effectiveness during initial 4-6 hours after procedure(Short-Term Relief): 100 %.  Long-term benefit: Defined as any relief past the pharmacologic duration of the local anesthetics.  Effectiveness past the initial 6 hours after procedure(Long-Term Relief): 50 % (2 days).  Current benefits: Defined as benefit that persist at this time.   Analgesia:  <50% better Function: Back to baseline ROM: Back to baseline  Pre-op Assessment:  Cynthia Jones is a 79 y.o. (year old), female patient, seen today for interventional treatment. She  has a past surgical history that includes Parotidectomy (Left); Appendectomy; Cholecystectomy; Abdominal hysterectomy; Tonsillectomy; Joint replacement (2014); Eye surgery; and Breast cyst aspiration (Right, 04/18/2013). Cynthia Jones has a current medication list which includes the following prescription(s): aripiprazole, b complex vitamins, carvedilol, cyclobenzaprine, diclofenac sodium, duloxetine, esomeprazole, hydrochlorothiazide, levothyroxine, losartan, nystatin, pregabalin, sucralfate, tramadol, UNABLE TO FIND, UNABLE TO FIND, and vitamin d (ergocalciferol). Her primarily concern today is the Knee Pain (left)  Initial Vital Signs:  Pulse/HCG Rate: 60  Temp: (!) 97.4 F (36.3 C) Resp: 18 BP: 138/63 SpO2: 98 %  BMI: Estimated body mass index is 50.58 kg/m as calculated from the following:   Height as of this encounter: 5' (1.524 m).   Weight as of this encounter: 259 lb (117.5 kg).  Risk Assessment: Allergies: Reviewed. She is allergic to nsaids and erythromycin.  Allergy Precautions: None required Coagulopathies: Reviewed. None identified.  Blood-thinner therapy: None at this time Active Infection(s): Reviewed. None identified. Cynthia Jones  is afebrile  Site Confirmation: Cynthia Jones was asked to confirm the procedure and laterality before marking the site Procedure checklist: Completed Consent: Before the procedure and under the influence of no sedative(s), amnesic(s), or anxiolytics, the patient was informed of the treatment options, risks and possible complications. To fulfill our ethical and legal obligations, as recommended by the American Medical Association's Code of Ethics, I have informed the patient of my clinical impression; the nature and purpose of the treatment or procedure; the risks, benefits, and possible complications of the intervention; the alternatives, including doing nothing; the risk(s) and benefit(s) of the alternative treatment(s) or procedure(s); and the risk(s) and benefit(s) of doing nothing. The patient was provided information about the general risks and possible complications associated with the procedure. These may include, but are not limited to: failure to achieve desired goals, infection, bleeding, organ or nerve damage, allergic reactions, paralysis, and death. In addition, the patient was informed of those risks and complications associated to the procedure, such as failure to decrease pain; infection; bleeding; organ or nerve damage with subsequent damage to sensory, motor, and/or autonomic systems, resulting in permanent pain, numbness, and/or weakness of one or several areas of the body; allergic reactions; (i.e.: anaphylactic reaction); and/or death. Furthermore, the patient was informed of those risks and complications associated with the medications. These include, but are not limited to: allergic reactions (i.e.: anaphylactic or anaphylactoid reaction(s)); adrenal axis suppression; blood sugar elevation that in diabetics may result in ketoacidosis or comma; water retention that in patients with history of congestive heart failure may result in  shortness of breath, pulmonary edema, and decompensation  with resultant heart failure; weight gain; swelling or edema; medication-induced neural toxicity; particulate matter embolism and blood vessel occlusion with resultant organ, and/or nervous system infarction; and/or aseptic necrosis of one or more joints. Finally, the patient was informed that Medicine is not an exact science; therefore, there is also the possibility of unforeseen or unpredictable risks and/or possible complications that may result in a catastrophic outcome. The patient indicated having understood very clearly. We have given the patient no guarantees and we have made no promises. Enough time was given to the patient to ask questions, all of which were answered to the patient's satisfaction. Ms. Aguinaldo has indicated that she wanted to continue with the procedure. Attestation: I, the ordering provider, attest that I have discussed with the patient the benefits, risks, side-effects, alternatives, likelihood of achieving goals, and potential problems during recovery for the procedure that I have provided informed consent. Date  Time: 06/25/2020 11:53 AM  Pre-Procedure Preparation:  Monitoring: As per clinic protocol. Respiration, ETCO2, SpO2, BP, heart rate and rhythm monitor placed and checked for adequate function Safety Precautions: Patient was assessed for positional comfort and pressure points before starting the procedure. Time-out: I initiated and conducted the "Time-out" before starting the procedure, as per protocol. The patient was asked to participate by confirming the accuracy of the "Time Out" information. Verification of the correct person, site, and procedure were performed and confirmed by me, the nursing staff, and the patient. "Time-out" conducted as per Joint Commission's Universal Protocol (UP.01.01.01). Time: 1220  Description of Procedure:          Target Area: Knee Joint Approach: Just above the Lateral tibial plateau, lateral to the infrapatellar tendon. Area  Prepped: Entire knee area, from the mid-thigh to the mid-shin. DuraPrep (Iodine Povacrylex [0.7% available iodine] and Isopropyl Alcohol, 74% w/w) Safety Precautions: Aspiration looking for blood return was conducted prior to all injections. At no point did we inject any substances, as a needle was being advanced. No attempts were made at seeking any paresthesias. Safe injection practices and needle disposal techniques used. Medications properly checked for expiration dates. SDV (single dose vial) medications used. Description of the Procedure: Protocol guidelines were followed. The patient was placed in position over the fluoroscopy table. The target area was identified and the area prepped in the usual manner. Skin & deeper tissues infiltrated with local anesthetic. Appropriate amount of time allowed to pass for local anesthetics to take effect. The procedure needles were then advanced to the target area. Proper needle placement secured. Negative aspiration confirmed. Solution injected in intermittent fashion, asking for systemic symptoms every 0.5cc of injectate. The needles were then removed and the area cleansed, making sure to leave some of the prepping solution back to take advantage of its long term bactericidal properties. Vitals:   06/25/20 1152  BP: 138/63  Pulse: 60  Resp: 18  Temp: (!) 97.4 F (36.3 C)  SpO2: 98%  Weight: 259 lb (117.5 kg)  Height: 5' (1.524 m)    Start Time: 1220 hrs. End Time: 1221 hrs. Materials:  Needle(s) Type: Regular needle Gauge: 25G Length: 1.5-in Medication(s): Please see orders for medications and dosing details.  Imaging Guidance:          Type of Imaging Technique: None used Indication(s): N/A Exposure Time: No patient exposure Contrast: None used. Fluoroscopic Guidance: N/A Ultrasound Guidance: N/A Interpretation: N/A  Antibiotic Prophylaxis:   Anti-infectives (From admission, onward)   None  Indication(s): None  identified  Post-operative Assessment:  Post-procedure Vital Signs:  Pulse/HCG Rate: 60  Temp: (!) 97.4 F (36.3 C) Resp: 18 BP: 138/63 SpO2: 98 %  EBL: None  Complications: No immediate post-treatment complications observed by team, or reported by patient.  Note: The patient tolerated the entire procedure well. A repeat set of vitals were taken after the procedure and the patient was kept under observation following institutional policy, for this type of procedure. Post-procedural neurological assessment was performed, showing return to baseline, prior to discharge. The patient was provided with post-procedure discharge instructions, including a section on how to identify potential problems. Should any problems arise concerning this procedure, the patient was given instructions to immediately contact us, at any time, without hesitation. In any case, we plan to contact the patient by telephone for a follow-up status report regarding this interventional procedure.  Comments:  No additional relevant information.  Plan of Care  Orders:  Orders Placed This Encounter  Procedures  . KNEE INJECTION    Hyalgan knee injection to be Jones by MD.    Scheduling Instructions:     Procedure: Intra-articular Hyalgan Knee injection #3     Side(s): Left Knee     Sedation: None     Timeframe: Today    Order Specific Question:   Where will this procedure be performed?    Answer:   [facility]  . Informed Consent Details: Physician/Practitioner Attestation; Transcribe to consent form and obtain patient signature    Note: Always confirm laterality of pain with Ms. Veale, before procedure. Transcribe to consent form and obtain patient signature.    Order Specific Question:   Physician/Practitioner attestation of informed consent for procedure/surgical case    Answer:   I, the physician/practitioner, attest that I have discussed with the patient the benefits, risks, side effects,  alternatives, likelihood of achieving goals and potential problems during recovery for the procedure that I have provided informed consent.    Order Specific Question:   Procedure    Answer:   Therapeutic, left sided, intra-articular Hyalgan knee injection    Order Specific Question:   Physician/Practitioner performing the procedure    Answer:   Sonam Wandel A. Laban Emperor, MD    Order Specific Question:   Indication/Reason    Answer:   Chronic left knee pain secondary to primary osteoarthritis of the knee   Chronic Opioid Analgesic:  No opioid analgesics prescribed by our practice.  At one time, I did prescribe some oxycodone for her, but she is currently not using it.  She did mention to me that she has been using the CBD Gummies.  I took the time to explain to her the medical legal issues associated with that and the possibility that she may test positive on a UDS for carboxy-THC. MME/day:0mg /day.   Medications ordered for procedure: Meds ordered this encounter  Medications  . lidocaine (PF) (XYLOCAINE) 1 % injection 5 mL  . ropivacaine (PF) 2 mg/mL (0.2%) (NAROPIN) injection 5 mL  . Sodium Hyaluronate SOSY 2 mL   Medications administered: We administered lidocaine (PF), ropivacaine (PF) 2 mg/mL (0.2%), and Sodium Hyaluronate.  See the medical record for exact dosing, route, and time of administration.  Follow-up plan:   Return in about 2 weeks (around 07/09/2020) for (VV), (PP) Follow-up.       Interventional treatment options: Planned, scheduled, and/or pending:   No further increases on opioid analgesics    Under consideration:   Possible right Genicular nerve RFA  Diagnostic left Genicular NB Possibleleft Genicular nerve RFA Possible bilateral lumbar facet RFA Diagnostic left LESI Diagnostic caudal ESI + epidurogram Possible Racz procedure Diagnostic bilateral cervical facet block Possible bilateral cervical facet RFA Diagnostic right versus left CESI Diagnostic  bilateral IA shoulder joint injection Diagnostic bilateral suprascapular NB Possible bilateral suprascapular nerve RFA   Therapeutic/palliative (PRN):  Diagnostic right Genicular NB #2(90/90/50)  Diagnosticleft IA knee joint injection #2(100/100/90)  Palliativeleft IA Hyalgan Knee injection S3N1(last completed on 11/01/2018)  Diagnostic bilateral lumbar facet block #2     Recent Visits Date Type Provider Dept  06/11/20 Procedure visit Cynthia Metz, MD [facility]  05/16/20 Procedure visit Cynthia Metz, MD [facility]  03/28/20 Telemedicine Cynthia Metz, MD [facility]  Showing recent visits within past 90 days and meeting all other requirements Today's Visits Date Type Provider Dept  06/25/20 Procedure visit Cynthia Metz, MD [facility]  Showing today's visits and meeting all other requirements Future Appointments Date Type Provider Dept  07/10/20 Appointment Cynthia Metz, MD [facility]  Showing future appointments within next 90 days and meeting all other requirements  Disposition: Discharge home  Discharge (Date  Time): 06/25/2020; 1221 hrs.   Primary Care Physician: Cynthia Nearing, PA-C Location: [facility] Note by: Cynthia Done, MD Date: 06/25/2020; Time: 12:34 PM  Disclaimer:  Medicine is not an exact science. The only guarantee in medicine is that nothing is guaranteed. It is important to note that the decision to proceed with this intervention was based on the information collected from the patient. The Data and conclusions were drawn from the patient's questionnaire, the interview, and the physical examination. Because the information was provided in large part by the patient, it cannot be guaranteed that it has not been purposely or unconsciously manipulated. Every effort has been made to obtain as much relevant data as possible for this  evaluation. It is important to note that the conclusions that lead to this procedure are derived in large part from the available data. Always take into account that the treatment will also be dependent on availability of resources and existing treatment guidelines, considered by other Pain Management Practitioners as being common knowledge and practice, at the time of the intervention. For Medico-Legal purposes, it is also important to point out that variation in procedural techniques and pharmacological choices are the acceptable norm. The indications, contraindications, technique, and results of the above procedure should only be interpreted and judged by a Board-Certified Interventional Pain Specialist with extensive familiarity and expertise in the same exact procedure and technique.

## 2020-06-25 NOTE — Patient Instructions (Signed)

## 2020-06-26 ENCOUNTER — Telehealth: Payer: Self-pay

## 2020-06-26 DIAGNOSIS — M8580 Other specified disorders of bone density and structure, unspecified site: Secondary | ICD-10-CM | POA: Diagnosis not present

## 2020-06-26 DIAGNOSIS — M1712 Unilateral primary osteoarthritis, left knee: Secondary | ICD-10-CM | POA: Diagnosis not present

## 2020-06-26 DIAGNOSIS — E1140 Type 2 diabetes mellitus with diabetic neuropathy, unspecified: Secondary | ICD-10-CM | POA: Diagnosis not present

## 2020-06-26 DIAGNOSIS — M797 Fibromyalgia: Secondary | ICD-10-CM | POA: Diagnosis not present

## 2020-06-26 DIAGNOSIS — M47816 Spondylosis without myelopathy or radiculopathy, lumbar region: Secondary | ICD-10-CM | POA: Diagnosis not present

## 2020-06-26 DIAGNOSIS — I10 Essential (primary) hypertension: Secondary | ICD-10-CM | POA: Diagnosis not present

## 2020-06-26 DIAGNOSIS — G894 Chronic pain syndrome: Secondary | ICD-10-CM | POA: Diagnosis not present

## 2020-06-26 DIAGNOSIS — M7060 Trochanteric bursitis, unspecified hip: Secondary | ICD-10-CM | POA: Diagnosis not present

## 2020-06-26 DIAGNOSIS — B372 Candidiasis of skin and nail: Secondary | ICD-10-CM | POA: Diagnosis not present

## 2020-06-26 NOTE — Telephone Encounter (Signed)
Post procedure phone call.  Patient states she is doing well.  

## 2020-06-28 DIAGNOSIS — M7060 Trochanteric bursitis, unspecified hip: Secondary | ICD-10-CM | POA: Diagnosis not present

## 2020-06-28 DIAGNOSIS — E1140 Type 2 diabetes mellitus with diabetic neuropathy, unspecified: Secondary | ICD-10-CM | POA: Diagnosis not present

## 2020-06-28 DIAGNOSIS — G894 Chronic pain syndrome: Secondary | ICD-10-CM | POA: Diagnosis not present

## 2020-06-28 DIAGNOSIS — B372 Candidiasis of skin and nail: Secondary | ICD-10-CM | POA: Diagnosis not present

## 2020-06-28 DIAGNOSIS — I10 Essential (primary) hypertension: Secondary | ICD-10-CM | POA: Diagnosis not present

## 2020-06-28 DIAGNOSIS — M47816 Spondylosis without myelopathy or radiculopathy, lumbar region: Secondary | ICD-10-CM | POA: Diagnosis not present

## 2020-06-28 DIAGNOSIS — M797 Fibromyalgia: Secondary | ICD-10-CM | POA: Diagnosis not present

## 2020-06-28 DIAGNOSIS — M1712 Unilateral primary osteoarthritis, left knee: Secondary | ICD-10-CM | POA: Diagnosis not present

## 2020-06-28 DIAGNOSIS — M8580 Other specified disorders of bone density and structure, unspecified site: Secondary | ICD-10-CM | POA: Diagnosis not present

## 2020-07-01 DIAGNOSIS — M8580 Other specified disorders of bone density and structure, unspecified site: Secondary | ICD-10-CM | POA: Diagnosis not present

## 2020-07-01 DIAGNOSIS — B372 Candidiasis of skin and nail: Secondary | ICD-10-CM | POA: Diagnosis not present

## 2020-07-01 DIAGNOSIS — M797 Fibromyalgia: Secondary | ICD-10-CM | POA: Diagnosis not present

## 2020-07-01 DIAGNOSIS — E1140 Type 2 diabetes mellitus with diabetic neuropathy, unspecified: Secondary | ICD-10-CM | POA: Diagnosis not present

## 2020-07-01 DIAGNOSIS — I10 Essential (primary) hypertension: Secondary | ICD-10-CM | POA: Diagnosis not present

## 2020-07-01 DIAGNOSIS — G894 Chronic pain syndrome: Secondary | ICD-10-CM | POA: Diagnosis not present

## 2020-07-01 DIAGNOSIS — M7060 Trochanteric bursitis, unspecified hip: Secondary | ICD-10-CM | POA: Diagnosis not present

## 2020-07-01 DIAGNOSIS — M47816 Spondylosis without myelopathy or radiculopathy, lumbar region: Secondary | ICD-10-CM | POA: Diagnosis not present

## 2020-07-01 DIAGNOSIS — M1712 Unilateral primary osteoarthritis, left knee: Secondary | ICD-10-CM | POA: Diagnosis not present

## 2020-07-02 DIAGNOSIS — R262 Difficulty in walking, not elsewhere classified: Secondary | ICD-10-CM | POA: Insufficient documentation

## 2020-07-02 DIAGNOSIS — R251 Tremor, unspecified: Secondary | ICD-10-CM | POA: Diagnosis not present

## 2020-07-02 DIAGNOSIS — E1340 Other specified diabetes mellitus with diabetic neuropathy, unspecified: Secondary | ICD-10-CM | POA: Diagnosis not present

## 2020-07-02 DIAGNOSIS — R499 Unspecified voice and resonance disorder: Secondary | ICD-10-CM | POA: Insufficient documentation

## 2020-07-02 DIAGNOSIS — Z6841 Body Mass Index (BMI) 40.0 and over, adult: Secondary | ICD-10-CM | POA: Diagnosis not present

## 2020-07-03 ENCOUNTER — Other Ambulatory Visit: Payer: Self-pay | Admitting: Family Medicine

## 2020-07-03 DIAGNOSIS — M47816 Spondylosis without myelopathy or radiculopathy, lumbar region: Secondary | ICD-10-CM | POA: Diagnosis not present

## 2020-07-03 DIAGNOSIS — M1712 Unilateral primary osteoarthritis, left knee: Secondary | ICD-10-CM | POA: Diagnosis not present

## 2020-07-03 DIAGNOSIS — M797 Fibromyalgia: Secondary | ICD-10-CM | POA: Diagnosis not present

## 2020-07-03 DIAGNOSIS — B372 Candidiasis of skin and nail: Secondary | ICD-10-CM | POA: Diagnosis not present

## 2020-07-03 DIAGNOSIS — M8580 Other specified disorders of bone density and structure, unspecified site: Secondary | ICD-10-CM | POA: Diagnosis not present

## 2020-07-03 DIAGNOSIS — M7060 Trochanteric bursitis, unspecified hip: Secondary | ICD-10-CM | POA: Diagnosis not present

## 2020-07-03 DIAGNOSIS — G894 Chronic pain syndrome: Secondary | ICD-10-CM | POA: Diagnosis not present

## 2020-07-03 DIAGNOSIS — I10 Essential (primary) hypertension: Secondary | ICD-10-CM | POA: Diagnosis not present

## 2020-07-03 DIAGNOSIS — E1140 Type 2 diabetes mellitus with diabetic neuropathy, unspecified: Secondary | ICD-10-CM | POA: Diagnosis not present

## 2020-07-03 NOTE — Telephone Encounter (Signed)
Requested Prescriptions  Pending Prescriptions Disp Refills  . hydrochlorothiazide (HYDRODIURIL) 12.5 MG tablet [Pharmacy Med Name: HYDROCHLOROTHIAZIDE 12.5 MG TB] 90 tablet 1    Sig: TAKE 1 TABLET BY MOUTH EVERY DAY     Cardiovascular: Diuretics - Thiazide Passed - 07/03/2020  3:40 PM      Passed - Ca in normal range and within 360 days    Calcium  Date Value Ref Range Status  03/01/2020 9.4 8.7 - 10.3 mg/dL Final         Passed - Cr in normal range and within 360 days    Creatinine, Ser  Date Value Ref Range Status  03/01/2020 0.89 0.57 - 1.00 mg/dL Final         Passed - K in normal range and within 360 days    Potassium  Date Value Ref Range Status  03/01/2020 4.4 3.5 - 5.2 mmol/L Final         Passed - Na in normal range and within 360 days    Sodium  Date Value Ref Range Status  03/01/2020 140 134 - 144 mmol/L Final         Passed - Last BP in normal range    BP Readings from Last 1 Encounters:  06/25/20 138/63         Passed - Valid encounter within last 6 months    Recent Outpatient Visits          2 weeks ago Type 2 diabetes mellitus with diabetic neuropathy, without long-term current use of insulin (HCC)   [facility] [place], [place], [place]   3 months ago Lower leg edema   [facility] [name], [place]   4 months ago Essential hypertension   [facility] [place] Maser [place], [place]   5 months ago Osteoarthritis of knee (Left)   [facility] [name], [place]   6 months ago Fibromyalgia   [facility] [place], Salley Hews, [place]      Future Appointments            In 1 week Delano Metz, MD [facility]   In 2 months Johnson, Oralia Rud, DO [employer], [facility]

## 2020-07-04 DIAGNOSIS — G894 Chronic pain syndrome: Secondary | ICD-10-CM | POA: Diagnosis not present

## 2020-07-04 DIAGNOSIS — B372 Candidiasis of skin and nail: Secondary | ICD-10-CM | POA: Diagnosis not present

## 2020-07-04 DIAGNOSIS — M797 Fibromyalgia: Secondary | ICD-10-CM | POA: Diagnosis not present

## 2020-07-04 DIAGNOSIS — M47816 Spondylosis without myelopathy or radiculopathy, lumbar region: Secondary | ICD-10-CM | POA: Diagnosis not present

## 2020-07-04 DIAGNOSIS — M7060 Trochanteric bursitis, unspecified hip: Secondary | ICD-10-CM | POA: Diagnosis not present

## 2020-07-04 DIAGNOSIS — M1712 Unilateral primary osteoarthritis, left knee: Secondary | ICD-10-CM | POA: Diagnosis not present

## 2020-07-04 DIAGNOSIS — E1140 Type 2 diabetes mellitus with diabetic neuropathy, unspecified: Secondary | ICD-10-CM | POA: Diagnosis not present

## 2020-07-04 DIAGNOSIS — I10 Essential (primary) hypertension: Secondary | ICD-10-CM | POA: Diagnosis not present

## 2020-07-04 DIAGNOSIS — M8580 Other specified disorders of bone density and structure, unspecified site: Secondary | ICD-10-CM | POA: Diagnosis not present

## 2020-07-05 DIAGNOSIS — I10 Essential (primary) hypertension: Secondary | ICD-10-CM | POA: Diagnosis not present

## 2020-07-05 DIAGNOSIS — M8580 Other specified disorders of bone density and structure, unspecified site: Secondary | ICD-10-CM | POA: Diagnosis not present

## 2020-07-05 DIAGNOSIS — M1712 Unilateral primary osteoarthritis, left knee: Secondary | ICD-10-CM | POA: Diagnosis not present

## 2020-07-05 DIAGNOSIS — M7060 Trochanteric bursitis, unspecified hip: Secondary | ICD-10-CM | POA: Diagnosis not present

## 2020-07-05 DIAGNOSIS — E1140 Type 2 diabetes mellitus with diabetic neuropathy, unspecified: Secondary | ICD-10-CM | POA: Diagnosis not present

## 2020-07-05 DIAGNOSIS — B372 Candidiasis of skin and nail: Secondary | ICD-10-CM | POA: Diagnosis not present

## 2020-07-05 DIAGNOSIS — M797 Fibromyalgia: Secondary | ICD-10-CM | POA: Diagnosis not present

## 2020-07-05 DIAGNOSIS — M47816 Spondylosis without myelopathy or radiculopathy, lumbar region: Secondary | ICD-10-CM | POA: Diagnosis not present

## 2020-07-05 DIAGNOSIS — G894 Chronic pain syndrome: Secondary | ICD-10-CM | POA: Diagnosis not present

## 2020-07-08 ENCOUNTER — Telehealth: Payer: Self-pay | Admitting: Family Medicine

## 2020-07-08 ENCOUNTER — Ambulatory Visit: Payer: Medicare HMO | Admitting: Licensed Clinical Social Worker

## 2020-07-08 DIAGNOSIS — F419 Anxiety disorder, unspecified: Secondary | ICD-10-CM

## 2020-07-08 DIAGNOSIS — M47816 Spondylosis without myelopathy or radiculopathy, lumbar region: Secondary | ICD-10-CM | POA: Diagnosis not present

## 2020-07-08 DIAGNOSIS — B372 Candidiasis of skin and nail: Secondary | ICD-10-CM | POA: Diagnosis not present

## 2020-07-08 DIAGNOSIS — I10 Essential (primary) hypertension: Secondary | ICD-10-CM | POA: Diagnosis not present

## 2020-07-08 DIAGNOSIS — M1712 Unilateral primary osteoarthritis, left knee: Secondary | ICD-10-CM | POA: Diagnosis not present

## 2020-07-08 DIAGNOSIS — M8580 Other specified disorders of bone density and structure, unspecified site: Secondary | ICD-10-CM | POA: Diagnosis not present

## 2020-07-08 DIAGNOSIS — M797 Fibromyalgia: Secondary | ICD-10-CM | POA: Diagnosis not present

## 2020-07-08 DIAGNOSIS — F3342 Major depressive disorder, recurrent, in full remission: Secondary | ICD-10-CM

## 2020-07-08 DIAGNOSIS — E1140 Type 2 diabetes mellitus with diabetic neuropathy, unspecified: Secondary | ICD-10-CM

## 2020-07-08 DIAGNOSIS — G894 Chronic pain syndrome: Secondary | ICD-10-CM | POA: Diagnosis not present

## 2020-07-08 DIAGNOSIS — M7060 Trochanteric bursitis, unspecified hip: Secondary | ICD-10-CM | POA: Diagnosis not present

## 2020-07-08 NOTE — Telephone Encounter (Signed)
Called and gave verbal orders per Shanda Bumps.

## 2020-07-08 NOTE — Telephone Encounter (Signed)
Okay for verbal orders. 

## 2020-07-08 NOTE — Chronic Care Management (AMB) (Signed)
Chronic Care Management    Clinical Social Work Follow Up Note  07/08/2020 Name: Cynthia Jones MRN: [id number] DOB: 03-05-1941  Cynthia Jones is a 79 y.o. year old female who is a primary care patient of [name], [place]. The CCM team was consulted for assistance with Level of Care Concerns.   Review of patient status, including review of consultants reports, other relevant assessments, and collaboration with appropriate care team members and the patient's provider was performed as part of comprehensive patient evaluation and provision of chronic care management services.    SDOH (Social Determinants of Health) assessments performed: Yes    Outpatient Encounter Medications as of 07/08/2020  Medication Sig  . ARIPiprazole (ABILIFY) 5 MG tablet TAKE 1 TABLET BY MOUTH EVERY DAY  . b complex vitamins tablet Take 1 tablet by mouth daily.  . carvedilol (COREG) 6.25 MG tablet TAKE 1 TABLET (6.25 MG TOTAL) BY MOUTH 2 (TWO) TIMES DAILY WITH A MEAL.  . cyclobenzaprine (FLEXERIL) 5 MG tablet TAKE 1/2 TAB BY MOUTH 3 TIMES DAILY AS NEEDED FOR MUSCLE SPASMS  . diclofenac Sodium (VOLTAREN) 1 % GEL Apply 2 g topically as needed.   . DULoxetine (CYMBALTA) 60 MG capsule TAKE 1 CAPSULE BY MOUTH EVERY DAY (Patient taking differently: No sig reported)  . esomeprazole (NEXIUM) 40 MG capsule Take 40 mg by mouth as needed.   . hydrochlorothiazide (HYDRODIURIL) 12.5 MG tablet TAKE 1 TABLET BY MOUTH EVERY DAY  . levothyroxine (SYNTHROID) 50 MCG tablet TAKE 1 TABLET BY MOUTH EVERY DAY  . losartan (COZAAR) 100 MG tablet Take 1 tablet (100 mg total) by mouth daily.  [name] nystatin (MYCOSTATIN/NYSTOP) powder APPLY TO AFFECTED AREA 3 TIMES A DAY  . pregabalin (LYRICA) 50 MG capsule Take 1 capsule (50 mg total) by mouth 2 (two) times daily.  . sucralfate (CARAFATE) 1 g tablet Take 1 tablet (1 g total) by mouth 4 (four) times daily -  with meals and at bedtime.  . traMADol (ULTRAM) 50 MG tablet Take 1  tablet (50 mg total) by mouth 3 (three) times daily as needed for severe pain. Must last 30 days  . UNABLE TO FIND Take by mouth as needed. Cor[place]iden HBP   . UNABLE TO FIND Take by mouth daily. CBD gummies   . Vitamin D, Ergocalciferol, (DRISDOL) 1.25 MG (50000 UNIT) CAPS capsule TAKE 1 CAPSULE (50,000 UNITS TOTAL) BY MOUTH EVERY 7 (SEVEN) DAYS.   No facility-administered encounter medications on file as of 07/08/2020.     Goals Addressed    .  SW- "I lost my brother and my husband" (pt-stated)        Current Barriers:  . Chronic Mental Health needs related to depression, anxiety and most recently- grief . Limited social support . ADL IADL limitations . Mental Health Concerns  . Social Isolation . Memory Deficits . Suicidal Ideation/Homicidal Ideation: No  Clinical Social Work Goal(s):  [name] Over the next 120 days, patient will work with SW  bi-monthly  by telephone or in person to reduce or manage symptoms related to depression, grief and anxiety . Over the next 120 days, patient will demonstrate improved health management independence as evidenced by implementing appropriate self-care and mental health coping tools into her daily routine to combat stressors.  Interventions: . Patient interviewed and appropriate assessments performed: brief mental health assessment . LCSW spoke with patient on 07/08/20. Patient reports that she is stable. She shares that she keeps the telephone by hear 24/7 in case  of an emergency. She shares that she has large family that consist of 4 children, 4 grandchildren and 6 great grandchildren and with their assistance she is able to take appropriate care of herself. Patient is very adamant about not wanting long term placement.  . Patient reports that recent medication adjustment by PCP has been helpful to her chronic pain. . Patient shares that she has been experiencing ongoing tremors (especially in her left hand) and PCP referred her to a neurologist. Dr. Malvin Johns  had her start Sinemet 25/100 1 pill 3 times a day for this concern and patient reports that she has already seen a difference. She is hopeful that this medication will continue to be effective as she just started it last week.  . Patient has a strong family support network which includes her daughter who lives one hour away, a son that resides with her and another son that lives across from her. They all provides ongoing care giving and supervision but patient continues to have level of care concerns as they are only able to do so much and patient is not agreeable to placement. . Patient shares that her granddaughter visits her often for socialization with her young great grandchildren.  . Family have made great efforts to accommodate patient and her safety needs within her home. They completed some home repairs in her bedroom to improve patient's living condition. Family reported in the past that they eventually want to build a walk in shower for patient as well.  . Patient shares that she often sits in her lift chair and watches television. Health self-care education provided to encourage patient to increase physical activity.  . Patient interviewed and appropriate assessments performed . Provided mental health counseling with regard to anxiety, depression, grief support. Patient lost her spouse of 50 years and brother. Patient denies wanting any mental health involvement on 07/08/20. LCSW provided patient with information about available grief support resources within the area ([facility] and [facility]). LCSW sent an email in the past to family with this information in case she wishes to utilize these resources. Patient worked with Tax adviser at [facility] in the past but discontinued services after 3 telephonic visits because family reported that patient was not willing to talk or open up with therapist.    . Discussed plans with patient for ongoing care management follow up and provided patient  with direct contact information for care management team . Collaborated with primary care provider re: medication concerns . Assisted patient/caregiver with obtaining information about health plan benefits . Provided education and assistance to client regarding Advanced Directives. . Provided education to patient/caregiver regarding level of care options. . Provided education to patient/caregiver about Hospice and/or Palliative Care services . Patient declines any recent falls.  . Patient's daughter reported having to start therapy due to ongoing stress and caregiver burnout.   Patient Self Care Activities:  [name] Motivation for treatment . Strong family or social support  Patient Coping Strengths:  . Supportive Relationships . Family . Hopefulness  Patient Self Care Deficits:  . Lacks social connections . Unable to perform ADLs independently . Unable to perform IADLs independently  Please see past updates related to this goal by clicking on the "Past Updates" button in the selected goal       Follow Up Plan: SW will follow up with patient by phone over the next quarter   Dickie La, BSW, MSW, LCSW [facility] [place]  [facility]  Network Production assistant, [email or web address] Phone: [phone]

## 2020-07-08 NOTE — Telephone Encounter (Signed)
Home Health Verbal Orders - Caller/Agency: Judeth Cornfield with [facility]  Requesting OT/PT/Skilled Nursing/Social Work/Speech OT verbal orders  Frequency: one time a week for 4 weeks

## 2020-07-09 ENCOUNTER — Ambulatory Visit: Payer: Medicare HMO | Admitting: Family Medicine

## 2020-07-09 ENCOUNTER — Encounter: Payer: Self-pay | Admitting: Pain Medicine

## 2020-07-09 DIAGNOSIS — B372 Candidiasis of skin and nail: Secondary | ICD-10-CM | POA: Diagnosis not present

## 2020-07-09 DIAGNOSIS — I10 Essential (primary) hypertension: Secondary | ICD-10-CM | POA: Diagnosis not present

## 2020-07-09 DIAGNOSIS — M7060 Trochanteric bursitis, unspecified hip: Secondary | ICD-10-CM | POA: Diagnosis not present

## 2020-07-09 DIAGNOSIS — M797 Fibromyalgia: Secondary | ICD-10-CM | POA: Diagnosis not present

## 2020-07-09 DIAGNOSIS — E1140 Type 2 diabetes mellitus with diabetic neuropathy, unspecified: Secondary | ICD-10-CM | POA: Diagnosis not present

## 2020-07-09 DIAGNOSIS — M8580 Other specified disorders of bone density and structure, unspecified site: Secondary | ICD-10-CM | POA: Diagnosis not present

## 2020-07-09 DIAGNOSIS — M47816 Spondylosis without myelopathy or radiculopathy, lumbar region: Secondary | ICD-10-CM | POA: Diagnosis not present

## 2020-07-09 DIAGNOSIS — M1712 Unilateral primary osteoarthritis, left knee: Secondary | ICD-10-CM | POA: Diagnosis not present

## 2020-07-09 DIAGNOSIS — G894 Chronic pain syndrome: Secondary | ICD-10-CM | POA: Diagnosis not present

## 2020-07-10 ENCOUNTER — Other Ambulatory Visit: Payer: Self-pay

## 2020-07-10 ENCOUNTER — Ambulatory Visit: Payer: Medicare HMO | Attending: Pain Medicine | Admitting: Pain Medicine

## 2020-07-10 DIAGNOSIS — G8929 Other chronic pain: Secondary | ICD-10-CM | POA: Insufficient documentation

## 2020-07-10 DIAGNOSIS — M25561 Pain in right knee: Secondary | ICD-10-CM | POA: Diagnosis not present

## 2020-07-10 DIAGNOSIS — M545 Low back pain, unspecified: Secondary | ICD-10-CM | POA: Diagnosis not present

## 2020-07-10 DIAGNOSIS — M1712 Unilateral primary osteoarthritis, left knee: Secondary | ICD-10-CM | POA: Diagnosis not present

## 2020-07-10 DIAGNOSIS — M5137 Other intervertebral disc degeneration, lumbosacral region: Secondary | ICD-10-CM | POA: Diagnosis not present

## 2020-07-10 DIAGNOSIS — G894 Chronic pain syndrome: Secondary | ICD-10-CM

## 2020-07-10 DIAGNOSIS — M25562 Pain in left knee: Secondary | ICD-10-CM

## 2020-07-10 DIAGNOSIS — Z79899 Other long term (current) drug therapy: Secondary | ICD-10-CM

## 2020-07-10 DIAGNOSIS — M5136 Other intervertebral disc degeneration, lumbar region: Secondary | ICD-10-CM | POA: Insufficient documentation

## 2020-07-10 DIAGNOSIS — M79604 Pain in right leg: Secondary | ICD-10-CM | POA: Diagnosis not present

## 2020-07-10 DIAGNOSIS — M79605 Pain in left leg: Secondary | ICD-10-CM

## 2020-07-10 NOTE — Progress Notes (Signed)
Patient: Cynthia Jones MPNTIRWER  Service Category: E/M  Provider: Gaspar Cola, MD  DOB: 04-Dec-1940  DOS: 07/10/2020  Location: Office  MRN: [id number]  Setting: Ambulatory outpatient  Referring Provider: Volney American,*  Type: Established Patient  Specialty: Interventional Pain Management  PCP: Volney American, PA-C  Location: Remote location  Delivery: TeleHealth     Virtual Encounter - Pain Management PROVIDER NOTE: Information contained herein reflects review and annotations entered in association with encounter. Interpretation of such information and data should be left to medically-trained personnel. Information provided to patient can be located elsewhere in the medical record under "Patient Instructions". Document created using STT-dictation technology, any transcriptional errors that may result from process are unintentional.    Contact & Pharmacy Preferred: [phone] Home: [phone] (home) Mobile: [phone] (mobile) E-mail: [email or web address]  [id number] - [place], [place] - [address] [place] [address] Phone: [phone] Fax: [phone]   Pre-screening  Ms. Chuck offered "in-person" vs "virtual" encounter. She indicated preferring virtual for this encounter.   Reason COVID-19*  Social distancing based on CDC and AMA recommendations.   I contacted Cynthia Jones on 07/10/2020 via telephone.      I clearly identified myself as Gaspar Cola, MD. I verified that I was speaking with the correct person using two identifiers (Name: [name], and date of birth: [birth year]).  Consent I sought verbal advanced consent from Grayling Congress for virtual visit interactions. I informed Ms. Hillegass of possible security and privacy concerns, risks, and limitations associated with providing "not-in-person" medical evaluation and management services. I also informed Ms. Mccardle of the availability of  "in-person" appointments. Finally, I informed her that there would be a charge for the virtual visit and that she could be  personally, fully or partially, financially responsible for it. Ms. Perrow expressed understanding and agreed to proceed.   Historic Elements   Cynthia Jones is a 79 y.o. year old, female patient evaluated today after our last contact on 06/25/2020. [name]  has a past medical history of Acute anxiety (03/06/2015), Allergy, Anxiety, Degenerative joint disease of knee, Depression, Fibromyalgia, Obesity, Osteopenia, and RAD (reactive airway disease). She also  has a past surgical history that includes Parotidectomy (Left); Appendectomy; Cholecystectomy; Abdominal hysterectomy; Tonsillectomy; Joint replacement (2014); Eye surgery; and Breast cyst aspiration (Right, 04/18/2013). [name] has a current medication list which includes the following prescription(s): aripiprazole, b complex vitamins, carbidopa-levodopa, carvedilol, cyclobenzaprine, diclofenac sodium, duloxetine, esomeprazole, hydrochlorothiazide, levothyroxine, losartan, nystatin, pregabalin, sucralfate, tramadol, UNABLE TO FIND, UNABLE TO FIND, and vitamin d (ergocalciferol). She  reports that she has never smoked. She has never used smokeless tobacco. She reports that she does not drink alcohol and does not use drugs. [name] is allergic to nsaids and erythromycin.   HPI  Today, she is being contacted for a post-procedure assessment.  Today I called the patient and she confirmed that after the third injection she did have a.  Time where it felt very sore, but after that it began to get better.  It would appear that she is in fact getting some benefit now that we have gotten to the third injection and therefore I believe that she would do better if we were to complete the series of 5.  However, she indicates that she wants to hold on that.  I have also review her chart believe that she would benefit  from perhaps getting an MRI of the lumbar spine since  she is having the back pain and pain down the leg which she tends to subdivide into the hip, knee, but there may be a pattern here where he could be radicular.  She is at that age where she may be having some lumbar spinal stenosis, which would explain not only her symptoms but the reason why peripheral treatments seem to provide limited benefit.  Today I have offered to order an MRI of the lumbar spine, but again she has also decided to hold on that.  She indicated that she would call us if things get worse and she feels that she needs to have that done.  Unfortunately, it seems that rather than being proactive in trying to get this pain under control, she seems to take the approach of not fires as they appear.  It is the patient's preservative to control her care and therefore there is not much I can do about that except for provide her with some recommendations.  Whether or not she follows them is a completely different issue.  As expected, the patient's 06/11/2020 UDS did come back positive for THC since he is taking the CBD Gummies.  Post-Procedure Evaluation  Procedure (06/25/2020): Therapeutic left IA Hyalgan knee injection #3/S2, no fluoroscopy or IV sedation Pre-procedure pain level: 8/10 Post-procedure: 0/10 (100% relief)  Sedation: None.  Effectiveness during initial hour after procedure(Ultra-Short Term Relief): 100 %.  Local anesthetic used: Long-acting (4-6 hours) Effectiveness: Defined as any analgesic benefit obtained secondary to the administration of local anesthetics. This carries significant diagnostic value as to the etiological location, or anatomical origin, of the pain. Duration of benefit is expected to coincide with the duration of the local anesthetic used.  Effectiveness during initial 4-6 hours after procedure(Short-Term Relief): 100 %.  Long-term benefit: Defined as any relief past the pharmacologic duration of the  local anesthetics.  Effectiveness past the initial 6 hours after procedure(Long-Term Relief): 75 % (back of the knee is hurting. for a few days the knee was very sore but is getting better every day.).  Current benefits: Defined as benefit that persist at this time.   Analgesia:  75% relief Function: Somewhat improved ROM: Somewhat improved  Pharmacotherapy Assessment  Analgesic: No opioid analgesics prescribed by our practice.  At one time, I did prescribe some oxycodone for her, but she is currently not using it.  She did mention to me that she has been using the CBD Gummies.  I took the time to explain to her the medical legal issues associated with that and the possibility that she may test positive on a UDS for carboxy-THC.  The patient's 06/11/2020 UDS did come back (+) THC). MME/day:$RemoveBeforeDEI'0mg'emUhJxsPPKMjXoJh$ /day.   Monitoring: [place] PMP: PDMP reviewed during this encounter.       Pharmacotherapy: No side-effects or adverse reactions reported. Compliance: No problems identified. Effectiveness: Clinically acceptable. Plan: Refer to "POC".  UDS:  Summary  Date Value Ref Range Status  06/11/2020 Note  Final    Comment:    ==================================================================== [employer]ox[employer] Select 13 (MW) ==================================================================== Test                             Result       Flag       Units  Drug Present not Declared for Prescription Verification   Carboxy-THC                    4  UNEXPECTED ng/mg creat    Carboxy-THC is a metabolite of tetrahydrocannabinol (THC). Source of    THC is most commonly herbal marijuana or marijuana-based products,    but THC is also present in a scheduled prescription medication.    Trace amounts of THC can be present in hemp and cannabidiol (CBD)    products. This test is not intended to distinguish between delta-9-    tetrahydrocannabinol, the predominant form of THC in most herbal or    marijuana-based  products, and delta-8-tetrahydrocannabinol.  Drug Absent but Declared for Prescription Verification   Tramadol                       Not Detected UNEXPECTED ng/mg creat ==================================================================== Test                      Result    Flag   Units      Ref Range   Creatinine              113              mg/dL      >=20 ==================================================================== Declared Medications:  The flagging and interpretation on this report are based on the  following declared medications.  Unexpected results may arise from  inaccuracies in the declared medications.   **Note: The testing scope of this panel includes these medications:   Tramadol (Ultram)   **Note: The testing scope of this panel does not include the  following reported medications:   Acetaminophen  Aripiprazole (Abilify)  Cannabidiol  Carvedilol (Coreg)  Chlorpheniramine  Cyclobenzaprine (Flexeril)  Diclofenac (Voltaren)  Duloxetine (Cymbalta)  Esomeprazole (Nexium)  Hydrochlorothiazide (Hydrodiuril)  Levothyroxine (Synthroid)  Losartan (Cozaar)  Nystatin (Mycostatin)  Pregabalin (Lyrica)  Vitamin B  Vitamin D2 (Drisdol) ==================================================================== For clinical consultation, please call [phone]. ====================================================================     Laboratory Chemistry Profile   Renal Lab Results  Component Value Date   BUN 17 03/01/2020   CREATININE 0.89 03/01/2020   BCR 19 03/01/2020   GFRAA 72 03/01/2020   GFRNONAA 63 03/01/2020     Hepatic Lab Results  Component Value Date   AST 17 03/01/2020   ALT 15 03/01/2020   ALBUMIN 4.1 03/01/2020   ALKPHOS 51 03/01/2020   LIPASE 41 11/20/2018     Electrolytes Lab Results  Component Value Date   NA 140 03/01/2020   K 4.4 03/01/2020   CL 103 03/01/2020   CALCIUM 9.4 03/01/2020   MG 2.0 08/08/2018     Bone Lab  Results  Component Value Date   25OHVITD1 20 (L) 08/08/2018   25OHVITD2 <1.0 08/08/2018   25OHVITD3 20 08/08/2018     Inflammation (CRP: Acute Phase) (ESR: Chronic Phase) Lab Results  Component Value Date   CRP 17 (H) 08/08/2018   ESRSEDRATE 94 (H) 08/08/2018       Note: Above Lab results reviewed.  Imaging  MM 3D SCREEN BREAST BILATERAL CLINICAL DATA:  Screening.  EXAM: DIGITAL SCREENING BILATERAL MAMMOGRAM WITH TOMO AND CAD  COMPARISON:  Previous exam(s).  ACR Breast Density Category b: There are scattered areas of fibroglandular density.  FINDINGS: There are no findings suspicious for malignancy. Images were processed with CAD.  IMPRESSION: No mammographic evidence of malignancy. A result letter of this screening mammogram will be mailed directly to the patient.  RECOMMENDATION: Screening mammogram in one year. (Code:SM-B-01Y)  BI-RADS CATEGORY  1: Negative.  Electronically Signed   By: Anderson Malta  Shelly Bombard M.D.   On: 01/09/2020 10:05  Assessment  The primary encounter diagnosis was Chronic pain syndrome. Diagnoses of Chronic knee pain (Left), Chronic lower extremity pain (Bilateral), Chronic knee pain (1ry area of Pain) (Bilateral) (L>R), Chronic low back pain (Secondary Area of Pain) (Bilateral) (L>R), DDD (degenerative disc disease), lumbosacral, Other intervertebral disc degeneration, lumbar region, Osteoarthritis of knee (Left), and Pharmacologic therapy were also pertinent to this visit.  Plan of Care  Problem-specific:  No problem-specific Assessment & Plan notes found for this encounter.  Ms. BERTIE MCCONATHY has a current medication list which includes the following long-term medication(s): aripiprazole, carbidopa-levodopa, carvedilol, diclofenac sodium, duloxetine, esomeprazole, hydrochlorothiazide, levothyroxine, losartan, pregabalin, sucralfate, and tramadol.  Pharmacotherapy (Medications Ordered): No orders of the defined types were placed in this  encounter.  Orders:  No orders of the defined types were placed in this encounter.  Follow-up plan:   Return if symptoms worsen or fail to improve.      Interventional treatment options: Planned, scheduled, and/or pending:   No further increases on opioid analgesics    Under consideration:   Possible right Genicular nerve RFA  Diagnostic left Genicular NB Possibleleft Genicular nerve RFA Possible bilateral lumbar facet RFA Diagnostic left LESI Diagnostic caudal ESI + epidurogram Possible Racz procedure Diagnostic bilateral cervical facet block Possible bilateral cervical facet RFA Diagnostic right versus left CESI Diagnostic bilateral IA shoulder joint injection Diagnostic bilateral suprascapular NB Possible bilateral suprascapular nerve RFA   Therapeutic/palliative (PRN):  Diagnostic right Genicular NB #2(90/90/50)  Diagnosticleft IA knee joint injection #2(100/100/90)  Palliativeleft IA Hyalgan Knee injection #4/S2(100/100/75/75) Diagnostic bilateral lumbar facet block #2    Recent Visits Date Type Provider Dept  06/25/20 Procedure visit Milinda Pointer, MD [facility]  06/11/20 Procedure visit Milinda Pointer, MD [facility]  05/16/20 Procedure visit Milinda Pointer, MD [facility]  Showing recent visits within past 90 days and meeting all other requirements Today's Visits Date Type Provider Dept  07/10/20 Telemedicine Milinda Pointer, MD [facility]  Showing today's visits and meeting all other requirements Future Appointments No visits were found meeting these conditions. Showing future appointments within next 90 days and meeting all other requirements  I discussed the assessment and treatment plan with the patient. The patient was provided an opportunity to ask questions and all were answered. The patient agreed with the plan and demonstrated an understanding of the instructions.  Patient  advised to call back or seek an in-person evaluation if the symptoms or condition worsens.  Duration of encounter: 15 minutes.  Note by: Gaspar Cola, MD Date: 07/10/2020; Time: 9:25 AM

## 2020-07-12 ENCOUNTER — Ambulatory Visit: Payer: Self-pay | Admitting: General Practice

## 2020-07-12 ENCOUNTER — Telehealth: Payer: Medicare HMO | Admitting: General Practice

## 2020-07-12 DIAGNOSIS — M797 Fibromyalgia: Secondary | ICD-10-CM

## 2020-07-12 DIAGNOSIS — E1140 Type 2 diabetes mellitus with diabetic neuropathy, unspecified: Secondary | ICD-10-CM

## 2020-07-12 DIAGNOSIS — F419 Anxiety disorder, unspecified: Secondary | ICD-10-CM

## 2020-07-12 DIAGNOSIS — R531 Weakness: Secondary | ICD-10-CM

## 2020-07-12 DIAGNOSIS — I10 Essential (primary) hypertension: Secondary | ICD-10-CM

## 2020-07-12 DIAGNOSIS — G894 Chronic pain syndrome: Secondary | ICD-10-CM

## 2020-07-12 DIAGNOSIS — F3342 Major depressive disorder, recurrent, in full remission: Secondary | ICD-10-CM

## 2020-07-12 NOTE — Chronic Care Management (AMB) (Signed)
Chronic Care Management   Follow Up Note   07/12/2020 Name: SUMIRE WECHSLER MRN: [id number] DOB: 10-07-1940  Referred by: [name], PA-C Reason for referral : Chronic Care Management (RNCM Follow up call for Chronic Disease Management and Care Coordination Needs )   ROSEMARI TRAEGER is a 79 y.o. year old female who is a primary care patient of [name], [place]. The CCM team was consulted for assistance with chronic disease management and care coordination needs.    Review of patient status, including review of consultants reports, relevant laboratory and other test results, and collaboration with appropriate care team members and the patient's provider was performed as part of comprehensive patient evaluation and provision of chronic care management services.    SDOH (Social Determinants of Health) assessments performed: Yes See Care Plan activities for detailed interventions related to [facility])     Outpatient Encounter Medications as of 07/12/2020  Medication Sig  . ARIPiprazole (ABILIFY) 5 MG tablet TAKE 1 TABLET BY MOUTH EVERY DAY  . b complex vitamins tablet Take 1 tablet by mouth daily.  . carbidopa-levodopa (SINEMET IR) 25-100 MG tablet Take 1 tablet by mouth in the morning, at noon, and at bedtime.  . carvedilol (COREG) 6.25 MG tablet TAKE 1 TABLET (6.25 MG TOTAL) BY MOUTH 2 (TWO) TIMES DAILY WITH A MEAL.  . cyclobenzaprine (FLEXERIL) 5 MG tablet TAKE 1/2 TAB BY MOUTH 3 TIMES DAILY AS NEEDED FOR MUSCLE SPASMS  . diclofenac Sodium (VOLTAREN) 1 % GEL Apply 2 g topically as needed.   . DULoxetine (CYMBALTA) 60 MG capsule TAKE 1 CAPSULE BY MOUTH EVERY DAY (Patient taking differently: No sig reported)  . esomeprazole (NEXIUM) 40 MG capsule Take 40 mg by mouth as needed.   . hydrochlorothiazide (HYDRODIURIL) 12.5 MG tablet TAKE 1 TABLET BY MOUTH EVERY DAY  . levothyroxine (SYNTHROID) 50 MCG tablet TAKE 1 TABLET BY MOUTH EVERY DAY  . losartan (COZAAR) 100  MG tablet Take 1 tablet (100 mg total) by mouth daily.  [name] nystatin (MYCOSTATIN/NYSTOP) powder APPLY TO AFFECTED AREA 3 TIMES A DAY  . pregabalin (LYRICA) 50 MG capsule Take 1 capsule (50 mg total) by mouth 2 (two) times daily.  . sucralfate (CARAFATE) 1 g tablet Take 1 tablet (1 g total) by mouth 4 (four) times daily -  with meals and at bedtime.  . traMADol (ULTRAM) 50 MG tablet Take 1 tablet (50 mg total) by mouth 3 (three) times daily as needed for severe pain. Must last 30 days  . UNABLE TO FIND Take by mouth as needed. Coriciden HBP   . UNABLE TO FIND Take by mouth daily. CBD gummies   . Vitamin D, Ergocalciferol, (DRISDOL) 1.25 MG (50000 UNIT) CAPS capsule TAKE 1 CAPSULE (50,000 UNITS TOTAL) BY MOUTH EVERY 7 (SEVEN) DAYS.   No facility-administered encounter medications on file as of 07/12/2020.     Objective:  BP Readings from Last 3 Encounters:  06/25/20 138/63  06/13/20 (!) 159/88  06/11/20 132/70    Goals Addressed              This Visit's Progress   .  RNCM: Mom is having a really hard time managing her pain and discomfort        CARE PLAN ENTRY (see longtitudinal plan of care for additional care plan information)  Current Barriers:  [name] Knowledge Deficits related to how to effectively manage pain as evidence by unresolved pain in patient with chronic pain syndrome, depression and anxiety .  Unresolved grief, depression and anxiety as evidence of losing her spouse and having to move in with her daughter, recent death of her last living sibling on 10/29/2019, the patient is back in her home setting and happy  Nurse Case Manager Clinical Goal(s):  [name] Over the next 120 days, patient will verbalize understanding of plan for managing pain effectively, having decreased episodes of depression and anxiety . Over the next 120 days, patient will work with [facility] and pcp to address needs related to pain relief measures and dealing with her depression and anxiety  . Over the next 120  days, patient will demonstrate a decrease in pain exacerbations as evidenced by controlled pain . Over the next 120 days, patient will attend all scheduled medical appointments: Front office staff to make an appointment for the patient to follow up with pcp  Interventions:  . Evaluation of current treatment plan related to pain control and patient's adherence to plan as established by provider. 03-29-2020: The patient reports her pain is much better. She says the right knee hardly hurts at all the left knee is at 75% better. Has been getting shots in her knees and can see a huge difference. The patient also is ambulating without her walker and doing well. Her son Kathlene November says since she has moved back home she is doing very well. 07-12-2020: The patient states that her pain and discomfort are stable at this time. The shots have helped but not completely taken the pain away. She is working with pt and that is helping her with strengthening and weakness.  . Provided education to patient re: if pain worsens or changes in level of intensity to call the MD. . Reviewed medications with patient and discussed compliance- 03-28-2020: The patient son states that since she has come back home and come off some of the medications she was on he has his "mama back". She is canning green beans, walking without her walker, not drooling on herself.  She is happy and doing well. He says she is more coherent and not "out of it" like she was. He says bringing her back to her home that she had his dad shared has been very good for the patient. They take her out to breakfast if she wants to go and she goes out on the porch and sits and does things she use to do without difficulty. 07-12-2020: The patient is still in her home and is happy. She was with her daughter and they were going to visit her other daughter.  She feels she is stable at this time.  . Evaluation of home health services. 03-29-2020: The patient has worked with PT/ST and  has an aide that comes in and assist with basic needs. The patient per the son is more independent since she is back home from his sisters house. She came back to [facility] on June 28th. 07-12-2020: The patient continue to work with PT and that is helping her a lot. She sees great benefit from it.  .   Reviewed scheduled/upcoming provider appointments including: next appointment with pcp on 09-03-2020 at 1:40 pm. . Pharmacy consult for support and recommendations- completed.  The pharmacist is available for assistance as needed  . Social worker support for effective management of grief and depression.  The patient is currently working with the LCSW   Patient Self Care Activities:  . Patient verbalizes understanding of plan to work with the interdisciplinary team to help patient manage her pain, depression and anxiety  more effectively . Self administers medications as prescribed . Attends all scheduled provider appointments . Calls provider office for new concerns or questions . Unable to independently manage pain, depression and anxiety related to chronic conditions . Unable to perform IADLs independently  Please see past updates related to this goal by clicking on the "Past Updates" button in the selected goal      .  RNCM: PT:"I am so happy to be back home" (pt-stated)        Current Barriers:  . Chronic Disease Management support, education, and care coordination needs related to HTN, DMII, Anxiety, Depression, and Chronic Pain . Grief and mourning over the loss of her husband and having to move in with her daughter 10/30/2022 is her husbands death anniversary). The patients last living sibling passed away on 2019-11-03. 02-Mar-2020 the patient moved back to the home she and her husband shared and her 2 sons are taking care of her. They live next door.   Clinical Goal(s) related to HTN, DMII, Anxiety, Depression, and Chronic Pain :  Over the next 120 days, patient will:  . Work with the care  management team to address educational, disease management, and care coordination needs  . Begin or continue self health monitoring activities as directed today Measure and record blood pressure 4 times per week . Call provider office for new or worsened signs and symptoms Blood pressure findings outside established parameters and New or worsened symptom related to Depression, Anxiety or Chronic Pain . Call care management team with questions or concerns . Verbalize basic understanding of patient centered plan of care established today  Interventions related to HTN, DMII, Anxiety, Depression, and Chronic Pain :  . Evaluation of current treatment plans and patient's adherence to plan as established by provider.  The patient is doing well and being compliant with the plan of care. 07-12-2020: The patient saw pcp on 06-13-2020 and her hemoglobin A1C was 7.0.  She declined medication but has changed her diet and is trying to do more activity. She has cut out the ice cream. She verbalized she is doing what she is supposed to do.  . Assessed patient understanding of disease states. 03-29-2020: Spoke with the patient and her son Kathlene November.  The patient has been back to her home since Mar 02, 2020 and has made significant improvement in her health and well being. The patient has not had any new falls. Is off of medications that are making her "out of it", has her pain under control, is cooking and canning green beans, and is walking without her walker. The patient expressed how happy she was to be back home. 07-12-2020: Has a good support system with her family who help her manage her care.  . Assessed patient's education and care coordination needs.  The patient denies any new concerns at this time. The patient states she is enjoying "porch sitting" since the weather has gotten nicer. 03-29-2020: The patient is walking out in the yard and filling up the bird feeders. Her son says "I have my mama back". 07-12-2020: SDOH  reviewed the patient denies any issues at this time. Was happy that she was spending time with her daughter who was taking her to visit her other daughter.  . Provided disease specific education to patient.  The patient reports a good appetite. The patient denies any unintentional weight loss. 03-29-2020: The patients son cooks for her a lot and he has finally gotten her to stop eating ice cream everyday. They  have went with a sugar free option. He is working on helping the patient lose weight. He knows this will help with her pain in her knees as well. The patient has improved significantly per the son and positive things since the patient has moved back home. 07-12-2020: Education on watching sugars and salt. The patient states she is doing better with adhering to a special diet: Heart Healthy/ADA . Collaborated with appropriate clinical care team members regarding patient needs.  The patient continues to work with the CCM team and knows when to reach out for questions or concerns.  . Advised the patient and the patient's son to take medications as prescribed. The son is managing her medications and said she had not been taking the xanax properly and that is why she was "out of it" all the time. The patient only takes it as needed and the son says she has not taken hardly any since being home.   Patient Self Care Activities related to HTN, DMII, Anxiety, Depression, and Chronic Pain :  . Patient is unable to independently self-manage chronic health conditions  Please see past updates related to this goal by clicking on the "Past Updates" button in the selected goal          Plan:   Face to Face appointment with care management team member scheduled for:  09-03-2020 at 1:45 pm   Alto Denver RN, MSN, CCM Community Care Coordinator [place]  [facility] Mobile: [phone]

## 2020-07-12 NOTE — Patient Instructions (Signed)
Visit Information  Goals Addressed              This Visit's Progress     RNCM: Mom is having a really hard time managing her pain and discomfort        CARE PLAN ENTRY (see longtitudinal plan of care for additional care plan information)  Current Barriers:   Knowledge Deficits related to how to effectively manage pain as evidence by unresolved pain in patient with chronic pain syndrome, depression and anxiety  Unresolved grief, depression and anxiety as evidence of losing her spouse and having to move in with her daughter, recent death of her last living sibling on 10/29/2019, the patient is back in her home setting and happy  Nurse Case Manager Clinical Goal(s):   Over the next 120 days, patient will verbalize understanding of plan for managing pain effectively, having decreased episodes of depression and anxiety  Over the next 120 days, patient will work with [facility] and pcp to address needs related to pain relief measures and dealing with her depression and anxiety   Over the next 120 days, patient will demonstrate a decrease in pain exacerbations as evidenced by controlled pain  Over the next 120 days, patient will attend all scheduled medical appointments: Front office staff to make an appointment for the patient to follow up with pcp  Interventions:   Evaluation of current treatment plan related to pain control and patient's adherence to plan as established by provider. 03-29-2020: The patient reports her pain is much better. She says the right knee hardly hurts at all the left knee is at 75% better. Has been getting shots in her knees and can see a huge difference. The patient also is ambulating without her walker and doing well. Her son Kathlene November says since she has moved back home she is doing very well. 07-12-2020: The patient states that her pain and discomfort are stable at this time. The shots have helped but not completely taken the pain away. She is working with pt and that is  helping her with strengthening and weakness.   Provided education to patient re: if pain worsens or changes in level of intensity to call the MD.  Reviewed medications with patient and discussed compliance- 03-28-2020: The patient son states that since she has come back home and come off some of the medications she was on he has his "mama back". She is canning green beans, walking without her walker, not drooling on herself.  She is happy and doing well. He says she is more coherent and not "out of it" like she was. He says bringing her back to her home that she had his dad shared has been very good for the patient. They take her out to breakfast if she wants to go and she goes out on the porch and sits and does things she use to do without difficulty. 07-12-2020: The patient is still in her home and is happy. She was with her daughter and they were going to visit her other daughter.  She feels she is stable at this time.   Evaluation of home health services. 03-29-2020: The patient has worked with PT/ST and has an aide that comes in and assist with basic needs. The patient per the son is more independent since she is back home from his sisters house. She came back to [facility] on June 28th. 07-12-2020: The patient continue to work with PT and that is helping her a lot. She sees great benefit  from it.     Reviewed scheduled/upcoming provider appointments including: next appointment with pcp on 09-03-2020 at 1:40 pm.  Pharmacy consult for support and recommendations- completed.  The pharmacist is available for assistance as needed   Social worker support for effective management of grief and depression.  The patient is currently working with the LCSW   Patient Self Care Activities:   Patient verbalizes understanding of plan to work with the interdisciplinary team to help patient manage her pain, depression and anxiety more effectively  Self administers medications as prescribed  Attends all  scheduled provider appointments  Calls provider office for new concerns or questions  Unable to independently manage pain, depression and anxiety related to chronic conditions  Unable to perform IADLs independently  Please see past updates related to this goal by clicking on the "Past Updates" button in the selected goal        RNCM: PT:"I am so happy to be back home" (pt-stated)        Current Barriers:   Chronic Disease Management support, education, and care coordination needs related to HTN, DMII, Anxiety, Depression, and Chronic Pain  Grief and mourning over the loss of her husband and having to move in with her daughter 28-Oct-2022 is her husbands death anniversary). The patients last living sibling passed away on 11-01-2019. 02/29/20 the patient moved back to the home she and her husband shared and her 2 sons are taking care of her. They live next door.   Clinical Goal(s) related to HTN, DMII, Anxiety, Depression, and Chronic Pain :  Over the next 120 days, patient will:   Work with the care management team to address educational, disease management, and care coordination needs   Begin or continue self health monitoring activities as directed today Measure and record blood pressure 4 times per week  Call provider office for new or worsened signs and symptoms Blood pressure findings outside established parameters and New or worsened symptom related to Depression, Anxiety or Chronic Pain  Call care management team with questions or concerns  Verbalize basic understanding of patient centered plan of care established today  Interventions related to HTN, DMII, Anxiety, Depression, and Chronic Pain :   Evaluation of current treatment plans and patient's adherence to plan as established by provider.  The patient is doing well and being compliant with the plan of care. 07-12-2020: The patient saw pcp on 06-13-2020 and her hemoglobin A1C was 7.0.  She declined medication but has changed her  diet and is trying to do more activity. She has cut out the ice cream. She verbalized she is doing what she is supposed to do.   Assessed patient understanding of disease states. 03-29-2020: Spoke with the patient and her son Kathlene November.  The patient has been back to her home since February 29, 2020 and has made significant improvement in her health and well being. The patient has not had any new falls. Is off of medications that are making her "out of it", has her pain under control, is cooking and canning green beans, and is walking without her walker. The patient expressed how happy she was to be back home. 07-12-2020: Has a good support system with her family who help her manage her care.   Assessed patient's education and care coordination needs.  The patient denies any new concerns at this time. The patient states she is enjoying "porch sitting" since the weather has gotten nicer. 03-29-2020: The patient is walking out in the yard and filling  up the bird feeders. Her son says "I have my mama back". 07-12-2020: SDOH reviewed the patient denies any issues at this time. Was happy that she was spending time with her daughter who was taking her to visit her other daughter.   Provided disease specific education to patient.  The patient reports a good appetite. The patient denies any unintentional weight loss. 03-29-2020: The patients son cooks for her a lot and he has finally gotten her to stop eating ice cream everyday. They have went with a sugar free option. He is working on helping the patient lose weight. He knows this will help with her pain in her knees as well. The patient has improved significantly per the son and positive things since the patient has moved back home. 07-12-2020: Education on watching sugars and salt. The patient states she is doing better with adhering to a special diet: Heart Healthy/ADA  Collaborated with appropriate clinical care team members regarding patient needs.  The patient continues to  work with the CCM team and knows when to reach out for questions or concerns.   Advised the patient and the patient's son to take medications as prescribed. The son is managing her medications and said she had not been taking the xanax properly and that is why she was "out of it" all the time. The patient only takes it as needed and the son says she has not taken hardly any since being home.   Patient Self Care Activities related to HTN, DMII, Anxiety, Depression, and Chronic Pain :   Patient is unable to independently self-manage chronic health conditions  Please see past updates related to this goal by clicking on the "Past Updates" button in the selected goal         The patient verbalized understanding of instructions, educational materials, and care plan provided today and declined offer to receive copy of patient instructions, educational materials, and care plan.   Face to Face appointment with care management team member scheduled for:  09-03-2020 at 1:45 pm  Alto Denver RN, MSN, CCM Community Care Coordinator [place]   [facility] Mobile: [phone]

## 2020-07-15 ENCOUNTER — Encounter: Payer: Self-pay | Admitting: Nurse Practitioner

## 2020-07-15 ENCOUNTER — Other Ambulatory Visit: Payer: Self-pay

## 2020-07-15 ENCOUNTER — Telehealth: Payer: Self-pay

## 2020-07-15 ENCOUNTER — Other Ambulatory Visit: Payer: Medicare HMO | Admitting: Nurse Practitioner

## 2020-07-15 DIAGNOSIS — Z515 Encounter for palliative care: Secondary | ICD-10-CM

## 2020-07-15 DIAGNOSIS — M797 Fibromyalgia: Secondary | ICD-10-CM

## 2020-07-15 NOTE — Telephone Encounter (Signed)
FYI - Looks like this is your patient.

## 2020-07-15 NOTE — Progress Notes (Signed)
Therapist, nutritional Palliative Care Consult Note Telephone: [phone]  Fax: [phone]  PATIENT NAME: Cynthia Jones DOB: [birth year] MRN: [id number]  PRIMARY CARE PROVIDER:   [name], PA-C  REFERRING PROVIDER:  [name], PA-C [address] [place],  [place] [address]  RESPONSIBLE PARTY:   Cynthia Jones son/Cynthia Jones Gauze daughter  Due to the COVID-19 crisis, this visit was done via telemedicine from my office and it was initiated and consent by this patient and or family.  1.Advance Care Planning; DNR  2. Goals of Care: Goals include to maximize quality of life and symptom management. Our advance care planning conversation included a discussion about:   The value and importance of advance care planning  Exploration of personal, cultural or spiritual beliefs that might influence medical decisions  Exploration of goals of care in the event of a sudden injury or illness  Identification and preparation of a healthcare agent  Review and updating or creation of anadvance directive document.   3.Palliative care encounter; Palliative care encounter; Palliative medicine team will continue to support patient, patient's family, and medical team. Visit consisted of counseling and education dealing with the complex and emotionally intense issues of symptom management and palliative care in the setting of serious and potentially life-threatening illness  4. f/u 1 months or sooner if declines for chronic disease monitoring progression, functional decline, see if sinemet helps, appetite, weight  I spent 50 minutes providing this consultation,  from 11:30am to 12:20pm. More than 50% of the time in this consultation was spent coordinating communication.   HISTORY OF PRESENT ILLNESS:  ZEKIAH COEN is a 79 y.o. year old female with multiple medical problems including cva, depression, DM, HTN, obesity, OA, RA, fibromyalgia,  tremors. Follow up palliative care visit called to confirm appointment. Discussed with daughter [name] that [name] is currently at her [place]. Cynthia in agreement to change appointment from face-to-face to telemedicine telephonic is video not available since [name] currently is not in her own home. We talked about purpose of Palliative care visit. Cynthia in agreement. We talked about how Mr against has been feeling. [name] endorses functionally [name] declining. [name] had two falls in the last couple weeks. [name] is total care requires to be bath, dressed. [name] is able to feed herself with tremor Tremor has improved with initiation of Sinemet started at initial visit with Dr Malvin Johns on 11/2 / 2021 Neurology. [name] endorses she was diagnosed with Parkinson's disease though Dr Daisy Blossom note does not reflect that only tremor at this point. Current weight 250 lb. We talked about ambulating safely. We talked about the pain that [name] experiences in her knees. [name] recently had an injection. Follows up with Dr Laban Emperor for chronic pain with last visit 11/10 / 2021. Dr. Laban Emperor, at [facility] offered to order an MRI of her lower spine but at this point Mr Allerton wanted to put that on hold unless things got worse. She continued on Lyrica and tramadol for pain, diclofenac sodium. Cynthia and I talked at length about functional changes. Cynthia and I talked about appetite, weight loss. Cynthia endorses [name] lost about 3 lbs recently. No changing clothes size. We talked about the caregivers that assist in their home. We talked about [name] going between her daughter's home and her own living with her son. Medical goals reviewed. We talked about role of Palliative care and plan of care. We  talked about functional changes and decline in appetite. We talked about quality of life. We talked about hospice benefit through  Medicare and eligibility criteria. Discussed will follow [name] closely  monitoring within the next four weeks to see a further decline. Cynthia in agreement, appointments scheduled. Therapeutic listening, emotional support provided. Contact information. Questions answered to satisfaction. Palliative Care was asked to help to continue to address goals of care.   CODE STATUS: DNR  PPS: 40% HOSPICE ELIGIBILITY/DIAGNOSIS: TBD  PAST MEDICAL HISTORY:  Past Medical History:  Diagnosis Date  . Acute anxiety 03/06/2015  . Allergy   . Anxiety   . Degenerative joint disease of knee   . Depression   . Fibromyalgia   . Obesity   . Osteopenia   . RAD (reactive airway disease)     SOCIAL HX:  Social History   Tobacco Use  . Smoking status: Never Smoker  . Smokeless tobacco: Never Used  Substance Use Topics  . Alcohol use: No    Alcohol/week: 0.0 standard drinks    ALLERGIES:  Allergies  Allergen Reactions  . Nsaids Other (See Comments)    GI upset, hx of H pylori and gastric ulcer  . Erythromycin Nausea Only       PHYSICAL EXAM:   Deferred  Osiah Haring Z Kilie Rund, NP

## 2020-07-15 NOTE — Telephone Encounter (Signed)
Copied from CRM [id number]. Topic: General - Other >> Jul 15, 2020  8:34 AM Jaquita Rector A wrote: Reason for CRM: Fayrene Fearing with [facility] called in to report a missed visit with patient to Dr Laural Benes. When they showed up for appointment patient was not at home she went to stay with her daughter for a few days. Any questions please call Ph# [phone]

## 2020-07-19 ENCOUNTER — Other Ambulatory Visit: Payer: Self-pay | Admitting: Family Medicine

## 2020-07-19 DIAGNOSIS — E118 Type 2 diabetes mellitus with unspecified complications: Secondary | ICD-10-CM

## 2020-07-19 NOTE — Telephone Encounter (Signed)
Requested medication (s) are due for refill today: yes  Requested medication (s) are on the active medication list: yes  Last refill:  05/01/2020  Future visit scheduled: yes  Notes to clinic:   Medication not assigned to a protocol, review manually  Requested Prescriptions  Pending Prescriptions Disp Refills   nystatin (MYCOSTATIN/NYSTOP) powder [Pharmacy Med Name: NYSTATIN 100,000 UNIT/GM POWD] 60 g 1    Sig: APPLY TO AFFECTED AREA 3 TIMES A DAY      Off-Protocol Failed - 07/19/2020  1:34 PM      Failed - Medication not assigned to a protocol, review manually.      Passed - Valid encounter within last 12 months    Recent Outpatient Visits           1 month ago Type 2 diabetes mellitus with diabetic neuropathy, without long-term current use of insulin (HCC)   [facility] [place], [place], DO   3 months ago Lower leg edema   [facility] Roosvelt Maser [place], [place]   4 months ago Essential hypertension   [facility] Roos[place] Maser [place], [place]   5 months ago Osteoarthritis of knee (Left)   [facility] [name], [place]   7 months ago Fibromyalgia   [facility] [place], Salley Hews, [place]       Future Appointments             In 1 month Laural Benes, Oralia Rud, DO [employer], [facility]

## 2020-07-19 NOTE — Telephone Encounter (Signed)
Requested medication (s) are due for refill today: yes  Requested medication (s) are on the active medication list:  yes  Last refill:  06/25/2020  Future visit scheduled: yes  Notes to clinic:  this refill cannot be delegated    Requested Prescriptions  Pending Prescriptions Disp Refills   cyclobenzaprine (FLEXERIL) 5 MG tablet [Pharmacy Med Name: CYCLOBENZAPRINE 5 MG TABLET] 90 tablet 2    Sig: TAKE 1 TABLET BY MOUTH THREE TIMES A DAY AS NEEDED FOR MUSCLE SPASMS      Not Delegated - Analgesics:  Muscle Relaxants Failed - 07/19/2020  1:31 PM      Failed - This refill cannot be delegated      Passed - Valid encounter within last 6 months    Recent Outpatient Visits           1 month ago Type 2 diabetes mellitus with diabetic neuropathy, without long-term current use of insulin (HCC)   [facility], [place], DO   3 months ago Lower leg edema   [facility] [name], [place]   4 months ago Essential hypertension   [facility] Roosvelt Maser [place], [place]   5 months ago Osteoarthritis of knee (Left)   [facility] [name], [place]   7 months ago Fibromyalgia   [facility] [place], Salley Hews, [place]       Future Appointments             In 1 month Laural Benes, Oralia Rud, DO [employer] [facility], [facility]

## 2020-07-22 DIAGNOSIS — M1712 Unilateral primary osteoarthritis, left knee: Secondary | ICD-10-CM | POA: Diagnosis not present

## 2020-07-22 DIAGNOSIS — M797 Fibromyalgia: Secondary | ICD-10-CM | POA: Diagnosis not present

## 2020-07-22 DIAGNOSIS — E1140 Type 2 diabetes mellitus with diabetic neuropathy, unspecified: Secondary | ICD-10-CM | POA: Diagnosis not present

## 2020-07-22 DIAGNOSIS — M47816 Spondylosis without myelopathy or radiculopathy, lumbar region: Secondary | ICD-10-CM | POA: Diagnosis not present

## 2020-07-22 DIAGNOSIS — M8580 Other specified disorders of bone density and structure, unspecified site: Secondary | ICD-10-CM | POA: Diagnosis not present

## 2020-07-22 DIAGNOSIS — M7060 Trochanteric bursitis, unspecified hip: Secondary | ICD-10-CM | POA: Diagnosis not present

## 2020-07-22 DIAGNOSIS — G894 Chronic pain syndrome: Secondary | ICD-10-CM | POA: Diagnosis not present

## 2020-07-22 DIAGNOSIS — I10 Essential (primary) hypertension: Secondary | ICD-10-CM | POA: Diagnosis not present

## 2020-07-22 DIAGNOSIS — B372 Candidiasis of skin and nail: Secondary | ICD-10-CM | POA: Diagnosis not present

## 2020-07-23 DIAGNOSIS — G894 Chronic pain syndrome: Secondary | ICD-10-CM | POA: Diagnosis not present

## 2020-07-23 DIAGNOSIS — I10 Essential (primary) hypertension: Secondary | ICD-10-CM | POA: Diagnosis not present

## 2020-07-23 DIAGNOSIS — E1140 Type 2 diabetes mellitus with diabetic neuropathy, unspecified: Secondary | ICD-10-CM | POA: Diagnosis not present

## 2020-07-23 DIAGNOSIS — M797 Fibromyalgia: Secondary | ICD-10-CM | POA: Diagnosis not present

## 2020-07-23 DIAGNOSIS — M7060 Trochanteric bursitis, unspecified hip: Secondary | ICD-10-CM | POA: Diagnosis not present

## 2020-07-23 DIAGNOSIS — M47816 Spondylosis without myelopathy or radiculopathy, lumbar region: Secondary | ICD-10-CM | POA: Diagnosis not present

## 2020-07-23 DIAGNOSIS — B372 Candidiasis of skin and nail: Secondary | ICD-10-CM | POA: Diagnosis not present

## 2020-07-23 DIAGNOSIS — M8580 Other specified disorders of bone density and structure, unspecified site: Secondary | ICD-10-CM | POA: Diagnosis not present

## 2020-07-23 DIAGNOSIS — M1712 Unilateral primary osteoarthritis, left knee: Secondary | ICD-10-CM | POA: Diagnosis not present

## 2020-07-26 ENCOUNTER — Telehealth: Payer: Self-pay | Admitting: Family Medicine

## 2020-07-26 NOTE — Telephone Encounter (Signed)
Fayrene Fearing PT with [facility]  is calling to let dr Laural Benes know the patient is out of town and will return back on monday and will miss her pt, home health aid and  social worker visit today

## 2020-07-29 ENCOUNTER — Other Ambulatory Visit: Payer: Self-pay

## 2020-07-29 NOTE — Telephone Encounter (Signed)
Noted! Thank you

## 2020-07-30 ENCOUNTER — Other Ambulatory Visit: Payer: Self-pay | Admitting: Family Medicine

## 2020-07-30 ENCOUNTER — Other Ambulatory Visit: Payer: Self-pay | Admitting: Nurse Practitioner

## 2020-07-30 DIAGNOSIS — M797 Fibromyalgia: Secondary | ICD-10-CM | POA: Diagnosis not present

## 2020-07-30 DIAGNOSIS — F3342 Major depressive disorder, recurrent, in full remission: Secondary | ICD-10-CM

## 2020-07-30 DIAGNOSIS — B372 Candidiasis of skin and nail: Secondary | ICD-10-CM | POA: Diagnosis not present

## 2020-07-30 DIAGNOSIS — M7060 Trochanteric bursitis, unspecified hip: Secondary | ICD-10-CM | POA: Diagnosis not present

## 2020-07-30 DIAGNOSIS — I10 Essential (primary) hypertension: Secondary | ICD-10-CM | POA: Diagnosis not present

## 2020-07-30 DIAGNOSIS — G894 Chronic pain syndrome: Secondary | ICD-10-CM | POA: Diagnosis not present

## 2020-07-30 DIAGNOSIS — M8580 Other specified disorders of bone density and structure, unspecified site: Secondary | ICD-10-CM | POA: Diagnosis not present

## 2020-07-30 DIAGNOSIS — M47816 Spondylosis without myelopathy or radiculopathy, lumbar region: Secondary | ICD-10-CM | POA: Diagnosis not present

## 2020-07-30 DIAGNOSIS — E1140 Type 2 diabetes mellitus with diabetic neuropathy, unspecified: Secondary | ICD-10-CM | POA: Diagnosis not present

## 2020-07-30 DIAGNOSIS — M1712 Unilateral primary osteoarthritis, left knee: Secondary | ICD-10-CM | POA: Diagnosis not present

## 2020-07-30 NOTE — Telephone Encounter (Signed)
Refused by: Hyman Bible, CMA    Refusal reason: Refill not appropriate (refilled m11/21/21)

## 2020-07-30 NOTE — Telephone Encounter (Signed)
Requested Prescriptions  Pending Prescriptions Disp Refills  . DULoxetine (CYMBALTA) 60 MG capsule [Pharmacy Med Name: DULOXETINE HCL DR 60 MG CAP] 90 capsule 1    Sig: TAKE 1 CAPSULE BY MOUTH EVERY DAY     Psychiatry: Antidepressants - SNRI Passed - 07/30/2020  3:45 PM      Passed - Completed PHQ-2 or PHQ-9 in the last 360 days      Passed - Last BP in normal range    BP Readings from Last 1 Encounters:  06/25/20 138/63         Passed - Valid encounter within last 6 months    Recent Outpatient Visits          1 month ago Type 2 diabetes mellitus with diabetic neuropathy, without long-term current use of insulin (HCC)   [facility] [place], [place], DO   4 months ago Lower leg edema   [facility] [name], [place]   5 months ago Essential hypertension   [facility] Roosvelt Maser [place], [place]   6 months ago Osteoarthritis of knee (Left)   [facility] [name], [place]   7 months ago Fibromyalgia   [facility] [place], Salley Hews, [place]      Future Appointments            In 1 month Laural Benes, Oralia Rud, DO [employer] [facility], [facility]

## 2020-07-30 NOTE — Telephone Encounter (Signed)
Requested Prescriptions  Pending Prescriptions Disp Refills  . carvedilol (COREG) 6.25 MG tablet [Pharmacy Med Name: CARVEDILOL 6.25 MG TABLET] 180 tablet 2    Sig: TAKE 1 TABLET BY MOUTH TWICE A DAY WITH A MEAL     Cardiovascular:  Beta Blockers Passed - 07/30/2020  3:45 PM      Passed - Last BP in normal range    BP Readings from Last 1 Encounters:  06/25/20 138/63         Passed - Last Heart Rate in normal range    Pulse Readings from Last 1 Encounters:  06/25/20 60         Passed - Valid encounter within last 6 months    Recent Outpatient Visits          1 month ago Type 2 diabetes mellitus with diabetic neuropathy, without long-term current use of insulin (HCC)   [facility] [place], [place], DO   4 months ago Lower leg edema   [facility] [name], [place]   5 months ago Essential hypertension   [facility] Roosvelt Maser [place], [place]   6 months ago Osteoarthritis of knee (Left)   [facility] [name], [place]   7 months ago Fibromyalgia   [facility] [name], [place]      Future Appointments            In 1 month Laural Benes, Megan P, DO [facility], [facility]           spoke with pharmacy and they have no record of refill sent 04/11/20  #180  2 refills. Resent to pharmacy.

## 2020-07-30 NOTE — Telephone Encounter (Signed)
Requested Prescriptions  Pending Prescriptions Disp Refills   carvedilol (COREG) 6.25 MG tablet [Pharmacy Med Name: CARVEDILOL 6.25 MG TABLET] 180 tablet 2    Sig: TAKE 1 TABLET BY MOUTH TWICE A DAY WITH A MEAL     Cardiovascular:  Beta Blockers Passed - 07/30/2020  3:45 PM      Passed - Last BP in normal range    BP Readings from Last 1 Encounters:  06/25/20 138/63         Passed - Last Heart Rate in normal range    Pulse Readings from Last 1 Encounters:  06/25/20 60         Passed - Valid encounter within last 6 months    Recent Outpatient Visits          1 month ago Type 2 diabetes mellitus with diabetic neuropathy, without long-term current use of insulin (HCC)   [facility] [place], [place], DO   4 months ago Lower leg edema   [facility] [name], [place]   5 months ago Essential hypertension   [facility] Roosvelt Maser [place], [place]   6 months ago Osteoarthritis of knee (Left)   [facility] [name], [place]   7 months ago Fibromyalgia   [facility] [name], [place]      Future Appointments            In 1 month Laural Benes, Megan P, DO [facility]           spoke with Pharmacy and they have no record of refill sent 04/11/20 for #180  2 refills. Resent

## 2020-07-30 NOTE — Telephone Encounter (Signed)
Requested medication (s) are due for refill today: yes  Requested medication (s) are on the active medication list: yes  Last refill:  07/19/20  #90  0 refills  Future visit scheduled: yes  Notes to clinic:Medication not delegated    Requested Prescriptions  Pending Prescriptions Disp Refills   cyclobenzaprine (FLEXERIL) 5 MG tablet [Pharmacy Med Name: CYCLOBENZAPRINE 5 MG TABLET] 90 tablet 0    Sig: TAKE 1 TABLET BY MOUTH THREE TIMES A DAY AS NEEDED FOR MUSCLE SPASMS      Not Delegated - Analgesics:  Muscle Relaxants Failed - 07/30/2020  3:45 PM      Failed - This refill cannot be delegated      Passed - Valid encounter within last 6 months    Recent Outpatient Visits           1 month ago Type 2 diabetes mellitus with diabetic neuropathy, without long-term current use of insulin (HCC)   [facility] [place], [place], DO   4 months ago Lower leg edema   [facility] [name], [place]   5 months ago Essential hypertension   [facility] Roosvelt Maser [place], [place]   6 months ago Osteoarthritis of knee (Left)   [facility] [name], [place]   7 months ago Fibromyalgia   [facility] [place], Salley Hews, [place]       Future Appointments             In 1 month Laural Benes, Oralia Rud, DO [facility], [facility]

## 2020-07-31 DIAGNOSIS — B372 Candidiasis of skin and nail: Secondary | ICD-10-CM | POA: Diagnosis not present

## 2020-07-31 DIAGNOSIS — M1712 Unilateral primary osteoarthritis, left knee: Secondary | ICD-10-CM | POA: Diagnosis not present

## 2020-07-31 DIAGNOSIS — M7060 Trochanteric bursitis, unspecified hip: Secondary | ICD-10-CM | POA: Diagnosis not present

## 2020-07-31 DIAGNOSIS — G894 Chronic pain syndrome: Secondary | ICD-10-CM | POA: Diagnosis not present

## 2020-07-31 DIAGNOSIS — E1140 Type 2 diabetes mellitus with diabetic neuropathy, unspecified: Secondary | ICD-10-CM | POA: Diagnosis not present

## 2020-07-31 DIAGNOSIS — I10 Essential (primary) hypertension: Secondary | ICD-10-CM | POA: Diagnosis not present

## 2020-07-31 DIAGNOSIS — M47816 Spondylosis without myelopathy or radiculopathy, lumbar region: Secondary | ICD-10-CM | POA: Diagnosis not present

## 2020-07-31 DIAGNOSIS — M8580 Other specified disorders of bone density and structure, unspecified site: Secondary | ICD-10-CM | POA: Diagnosis not present

## 2020-07-31 DIAGNOSIS — M797 Fibromyalgia: Secondary | ICD-10-CM | POA: Diagnosis not present

## 2020-07-31 NOTE — Telephone Encounter (Signed)
Pt verbalized understanding.

## 2020-08-01 ENCOUNTER — Telehealth: Payer: Self-pay | Admitting: Family Medicine

## 2020-08-01 NOTE — Telephone Encounter (Signed)
Cynthia Jones with [facility] is calling to see if dr Laural Benes would put a referral through [facility] phone number [phone] and fax number [phone] for this pt to have in home aid to assist the patient will light house cleaning, light meal prep and monitor for health and safety. Please go to website [facility]  And down load form [id number] and complete and  fax to [phone]

## 2020-08-01 NOTE — Telephone Encounter (Signed)
Will need in person ( or at least video) appt for this

## 2020-08-01 NOTE — Telephone Encounter (Signed)
Please get patient scheduled.  °

## 2020-08-01 NOTE — Telephone Encounter (Signed)
Pt scheduled for apt on 08/06/2020 pt verbalized understanding.

## 2020-08-01 NOTE — Telephone Encounter (Signed)
Formed started and placed in your folder.

## 2020-08-05 DIAGNOSIS — M8580 Other specified disorders of bone density and structure, unspecified site: Secondary | ICD-10-CM | POA: Diagnosis not present

## 2020-08-05 DIAGNOSIS — E1140 Type 2 diabetes mellitus with diabetic neuropathy, unspecified: Secondary | ICD-10-CM | POA: Diagnosis not present

## 2020-08-05 DIAGNOSIS — M1712 Unilateral primary osteoarthritis, left knee: Secondary | ICD-10-CM | POA: Diagnosis not present

## 2020-08-05 DIAGNOSIS — I10 Essential (primary) hypertension: Secondary | ICD-10-CM | POA: Diagnosis not present

## 2020-08-05 DIAGNOSIS — M47816 Spondylosis without myelopathy or radiculopathy, lumbar region: Secondary | ICD-10-CM | POA: Diagnosis not present

## 2020-08-05 DIAGNOSIS — G894 Chronic pain syndrome: Secondary | ICD-10-CM | POA: Diagnosis not present

## 2020-08-05 DIAGNOSIS — B372 Candidiasis of skin and nail: Secondary | ICD-10-CM | POA: Diagnosis not present

## 2020-08-05 DIAGNOSIS — M7060 Trochanteric bursitis, unspecified hip: Secondary | ICD-10-CM | POA: Diagnosis not present

## 2020-08-05 DIAGNOSIS — M797 Fibromyalgia: Secondary | ICD-10-CM | POA: Diagnosis not present

## 2020-08-06 ENCOUNTER — Encounter: Payer: Self-pay | Admitting: Family Medicine

## 2020-08-06 ENCOUNTER — Encounter: Payer: Self-pay | Admitting: Nurse Practitioner

## 2020-08-06 ENCOUNTER — Telehealth (INDEPENDENT_AMBULATORY_CARE_PROVIDER_SITE_OTHER): Payer: Medicare HMO | Admitting: Family Medicine

## 2020-08-06 ENCOUNTER — Other Ambulatory Visit: Payer: Medicare HMO | Admitting: Nurse Practitioner

## 2020-08-06 ENCOUNTER — Other Ambulatory Visit: Payer: Self-pay

## 2020-08-06 DIAGNOSIS — E1140 Type 2 diabetes mellitus with diabetic neuropathy, unspecified: Secondary | ICD-10-CM | POA: Diagnosis not present

## 2020-08-06 DIAGNOSIS — M797 Fibromyalgia: Secondary | ICD-10-CM | POA: Diagnosis not present

## 2020-08-06 DIAGNOSIS — G894 Chronic pain syndrome: Secondary | ICD-10-CM | POA: Diagnosis not present

## 2020-08-06 DIAGNOSIS — M47816 Spondylosis without myelopathy or radiculopathy, lumbar region: Secondary | ICD-10-CM | POA: Diagnosis not present

## 2020-08-06 DIAGNOSIS — Z515 Encounter for palliative care: Secondary | ICD-10-CM

## 2020-08-06 DIAGNOSIS — Z538 Procedure and treatment not carried out for other reasons: Secondary | ICD-10-CM

## 2020-08-06 DIAGNOSIS — M7060 Trochanteric bursitis, unspecified hip: Secondary | ICD-10-CM | POA: Diagnosis not present

## 2020-08-06 DIAGNOSIS — Z6841 Body Mass Index (BMI) 40.0 and over, adult: Secondary | ICD-10-CM | POA: Diagnosis not present

## 2020-08-06 DIAGNOSIS — I10 Essential (primary) hypertension: Secondary | ICD-10-CM | POA: Diagnosis not present

## 2020-08-06 DIAGNOSIS — M1712 Unilateral primary osteoarthritis, left knee: Secondary | ICD-10-CM | POA: Diagnosis not present

## 2020-08-06 DIAGNOSIS — M8580 Other specified disorders of bone density and structure, unspecified site: Secondary | ICD-10-CM | POA: Diagnosis not present

## 2020-08-06 DIAGNOSIS — B372 Candidiasis of skin and nail: Secondary | ICD-10-CM | POA: Diagnosis not present

## 2020-08-06 NOTE — Progress Notes (Signed)
Patient unable to get on virtual visit. Appt cancelled.

## 2020-08-06 NOTE — Progress Notes (Signed)
Therapist, nutritional Palliative Care Consult Note Telephone: [phone]  Fax: [phone]  PATIENT NAME: Cynthia Jones DOB: [birth year] MRN: [id number]  PRIMARY CARE PROVIDER:   [facility] RESPONSIBLE PARTY:   Cynthia Jones son/Cynthia Jones Gauze daughter  1.Advance Care Planning; DNR  2. Goals of Care: Goals include to maximize quality of life and symptom management. Our advance care planning conversation included a discussion about:   The value and importance of advance care planning  Exploration of personal, cultural or spiritual beliefs that might influence medical decisions  Exploration of goals of care in the event of a sudden injury or illness  Identification and preparation of a healthcare agent  Review and updating or creation of anadvance directive document.  3.Palliative care encounter; Palliative care encounter; Palliative medicine team will continue to support patient, patient's family, and medical team. Visit consisted of counseling and education dealing with the complex and emotionally intense issues of symptom management and palliative care in the setting of serious and potentially life-threatening illness  4. f/u1 months or sooner if declines for chronic disease monitoring progression, functional decline, see if sinemet helps, appetite, weight  I spent 65 minutes providing this consultation,  from 12:30pm to 1:35pm. More than 50% of the time in this consultation was spent coordinating communication.   HISTORY OF PRESENT ILLNESS:  Cynthia Jones is a 79 y.o. year old female with multiple medical problems including cva, depression, DM, HTN, obesity, OA, RA, fibromyalgia, tremors. In-person palliative care visit for Cynthia Jones, son Cynthia Jones was present. We talked about purpose of Palliative care visit. Ms. Cynthia Jones and Cynthia Jones both in agreement. We talked about Ms. Cynthia Jones functional level. Cynthia Jones endorses Ms.  Cynthia Jones has been able to ambulate with a walker no recent falls though it does take her some time with mobility. Physical Therapy continues to come out and work with her. We talked about adl's. Cynthia Jones endorses one of the social workers from possibly Home health is helping to try to get them extra services at home. We talked about insurance as she does have Medicaid. We talked about caps program and he said services. Discuss that will follow up with primary care provider to further see if PCS application has been initiated or what the plan for further home assistance would be. We talked about symptoms of pain, shortness of breath, Ms. Cynthia Jones currently denies. We talked about her appetite which has been good. Cynthia Jones did ask what the best plan of care to assist Ms. Cynthia Jones in weight loss would be. We talked about nutrition, limiting sweet, smaller meals more frequent. We talked about nutrition. We talked about family dynamics. We talked about Cynthia Jones having an upcoming work assignment for 4 weeks. We talked about Ms. Cynthia Jones arrangement and going back and forth between her her home and her daughter's home. Cynthia Jones endorses last time her daughter came to stay with her in her home. We talked about challenges with caregiver, stressors and fatigue. We talked about helping strategies. We talked about medical goals. We talked about role of Palliative care and plan of care. We talked about follow-up visit in six weeks if needed or sooner should Ms. Cynthia Jones to decline. Ms. Cynthia Jones in agreement, appointment schedule. Therapeutic therapeutic, emotional support provided. Contact information. Questions answered is satisfaction.  Palliative Care was asked to help to continue address goals of care.   CODE STATUS: DNR  PPS: 40% HOSPICE ELIGIBILITY/DIAGNOSIS: TBD  PAST MEDICAL HISTORY:  Past Medical History:  Diagnosis Date  .  Acute anxiety 03/06/2015  . Allergy   . Anxiety   . Degenerative joint disease of knee   .  Depression   . Fibromyalgia   . Obesity   . Osteopenia   . RAD (reactive airway disease)     SOCIAL HX:  Social History   Tobacco Use  . Smoking status: Never Smoker  . Smokeless tobacco: Never Used  Substance Use Topics  . Alcohol use: No    Alcohol/week: 0.0 standard drinks    ALLERGIES:  Allergies  Allergen Reactions  . Nsaids Other (See Comments)    GI upset, hx of H pylori and gastric ulcer  . Erythromycin Nausea Only     PERTINENT MEDICATIONS:  Outpatient Encounter Medications as of 08/06/2020  Medication Sig  . ARIPiprazole (ABILIFY) 5 MG tablet TAKE 1 TABLET BY MOUTH EVERY DAY  . b complex vitamins tablet Take 1 tablet by mouth daily.  . carbidopa-levodopa (SINEMET IR) 25-100 MG tablet Take 1 tablet by mouth in the morning, at noon, and at bedtime.  . carvedilol (COREG) 6.25 MG tablet TAKE 1 TABLET BY MOUTH TWICE A DAY WITH A MEAL  . cyclobenzaprine (FLEXERIL) 5 MG tablet TAKE 1 TABLET BY MOUTH THREE TIMES A DAY AS NEEDED FOR MUSCLE SPASMS  . diclofenac Sodium (VOLTAREN) 1 % GEL Apply 2 g topically as needed.   . DULoxetine (CYMBALTA) 60 MG capsule TAKE 1 CAPSULE BY MOUTH EVERY DAY  . esomeprazole (NEXIUM) 40 MG capsule Take 40 mg by mouth as needed.   . hydrochlorothiazide (HYDRODIURIL) 12.5 MG tablet TAKE 1 TABLET BY MOUTH EVERY DAY  . levothyroxine (SYNTHROID) 50 MCG tablet TAKE 1 TABLET BY MOUTH EVERY DAY  . losartan (COZAAR) 100 MG tablet Take 1 tablet (100 mg total) by mouth daily.  [name] nystatin (MYCOSTATIN/NYSTOP) powder APPLY TO AFFECTED AREA 3 TIMES A DAY  . pregabalin (LYRICA) 50 MG capsule Take 1 capsule (50 mg total) by mouth 2 (two) times daily.  . sucralfate (CARAFATE) 1 g tablet Take 1 tablet (1 g total) by mouth 4 (four) times daily -  with meals and at bedtime.  . traMADol (ULTRAM) 50 MG tablet Take 1 tablet (50 mg total) by mouth 3 (three) times daily as needed for severe pain. Must last 30 days  . UNABLE TO FIND Take by mouth as needed. Coriciden HBP   (Patient not taking: Reported on 08/06/2020)  . UNABLE TO FIND Take by mouth daily. CBD gummies  (Patient not taking: Reported on 08/06/2020)  . Vitamin D, Ergocalciferol, (DRISDOL) 1.25 MG (50000 UNIT) CAPS capsule TAKE 1 CAPSULE (50,000 UNITS TOTAL) BY MOUTH EVERY 7 (SEVEN) DAYS.   No facility-administered encounter medications on file as of 08/06/2020.    PHYSICAL EXAM:   General: NAD, frail appearing, thin Cardiovascular: regular rate and rhythm Pulmonary: clear ant fields Abdomen: soft, nontender, + bowel sounds GU: no suprapubic tenderness Extremities: no edema, no joint deformities Skin: no rashes Neurological: Weakness but otherwise nonfocal  Elishia Kaczorowski [place], NP

## 2020-08-07 ENCOUNTER — Ambulatory Visit: Payer: Self-pay | Admitting: Licensed Clinical Social Worker

## 2020-08-07 DIAGNOSIS — M797 Fibromyalgia: Secondary | ICD-10-CM | POA: Diagnosis not present

## 2020-08-07 DIAGNOSIS — M1712 Unilateral primary osteoarthritis, left knee: Secondary | ICD-10-CM | POA: Diagnosis not present

## 2020-08-07 DIAGNOSIS — B372 Candidiasis of skin and nail: Secondary | ICD-10-CM | POA: Diagnosis not present

## 2020-08-07 DIAGNOSIS — M7060 Trochanteric bursitis, unspecified hip: Secondary | ICD-10-CM | POA: Diagnosis not present

## 2020-08-07 DIAGNOSIS — G894 Chronic pain syndrome: Secondary | ICD-10-CM | POA: Diagnosis not present

## 2020-08-07 DIAGNOSIS — Z742 Need for assistance at home and no other household member able to render care: Secondary | ICD-10-CM

## 2020-08-07 DIAGNOSIS — I10 Essential (primary) hypertension: Secondary | ICD-10-CM | POA: Diagnosis not present

## 2020-08-07 DIAGNOSIS — M8580 Other specified disorders of bone density and structure, unspecified site: Secondary | ICD-10-CM | POA: Diagnosis not present

## 2020-08-07 DIAGNOSIS — E1140 Type 2 diabetes mellitus with diabetic neuropathy, unspecified: Secondary | ICD-10-CM | POA: Diagnosis not present

## 2020-08-07 DIAGNOSIS — M47816 Spondylosis without myelopathy or radiculopathy, lumbar region: Secondary | ICD-10-CM | POA: Diagnosis not present

## 2020-08-07 NOTE — Chronic Care Management (AMB) (Signed)
  Care Management   Follow Up Note   08/07/2020 Name: KYLEE UMANA MRN: [id number] DOB: 10-04-1940   Referred by: [name], PA-C Reason for referral : Care Coordination   PRIYAH SCHMUCK is a 79 y.o. year old female who is a primary care patient of [name], [place]. The care management team was consulted for assistance with care management and care coordination needs.    Review of patient status, including review of consultants reports, relevant laboratory and other test results, and collaboration with appropriate care team members and the patient's provider was performed as part of comprehensive patient evaluation and provision of chronic care management services.    LCSW received PCS request for Palliative Care NP on 08/07/20. LCSW sent patient's primary care doctor the [facility] form requesting completion by email. LCSW will make C3 referral for ensure appropriate follow up with [facility]. CCM team updated.   Dickie La, BSW, MSW, LCSW [facility] Care Management [place]  [facility] [email or web address] Phone: [phone]

## 2020-08-08 DIAGNOSIS — I10 Essential (primary) hypertension: Secondary | ICD-10-CM | POA: Diagnosis not present

## 2020-08-08 DIAGNOSIS — M8580 Other specified disorders of bone density and structure, unspecified site: Secondary | ICD-10-CM | POA: Diagnosis not present

## 2020-08-08 DIAGNOSIS — M797 Fibromyalgia: Secondary | ICD-10-CM | POA: Diagnosis not present

## 2020-08-08 DIAGNOSIS — B372 Candidiasis of skin and nail: Secondary | ICD-10-CM | POA: Diagnosis not present

## 2020-08-08 DIAGNOSIS — G894 Chronic pain syndrome: Secondary | ICD-10-CM | POA: Diagnosis not present

## 2020-08-08 DIAGNOSIS — M7060 Trochanteric bursitis, unspecified hip: Secondary | ICD-10-CM | POA: Diagnosis not present

## 2020-08-08 DIAGNOSIS — M47816 Spondylosis without myelopathy or radiculopathy, lumbar region: Secondary | ICD-10-CM | POA: Diagnosis not present

## 2020-08-08 DIAGNOSIS — E1140 Type 2 diabetes mellitus with diabetic neuropathy, unspecified: Secondary | ICD-10-CM | POA: Diagnosis not present

## 2020-08-08 DIAGNOSIS — M1712 Unilateral primary osteoarthritis, left knee: Secondary | ICD-10-CM | POA: Diagnosis not present

## 2020-08-09 ENCOUNTER — Telehealth: Payer: Self-pay | Admitting: Pain Medicine

## 2020-08-09 NOTE — Telephone Encounter (Signed)
Patient wants to know if she can take the CBD Gummies for her pain? She is not on any medications. Does not have a follow up appt. Please call patient and let her know what she should do.

## 2020-08-09 NOTE — Telephone Encounter (Signed)
Patient informed that THC could be present on the UDS, in which case Dr. Laban Emperor would no longer be able to prescribe Tramadol.

## 2020-08-10 ENCOUNTER — Other Ambulatory Visit: Payer: Self-pay | Admitting: Nurse Practitioner

## 2020-08-10 NOTE — Telephone Encounter (Signed)
Requested medication (s) are due for refill today: no  Requested medication (s) are on the active medication list: yes  Last refill:  05/01/20  Future visit scheduled: yes  Notes to clinic:  med not delegated to NT to RF   Requested Prescriptions  Pending Prescriptions Disp Refills   ARIPiprazole (ABILIFY) 5 MG tablet [Pharmacy Med Name: ARIPIPRAZOLE 5 MG TABLET] 90 tablet 1    Sig: TAKE 1 TABLET BY MOUTH EVERY DAY      Not Delegated - Psychiatry:  Antipsychotics - Second Generation (Atypical) - aripiprazole Failed - 08/10/2020  3:57 PM      Failed - This refill cannot be delegated      Passed - Valid encounter within last 6 months    Recent Outpatient Visits           4 days ago Appointment canceled by hospital   [facility], Megan P, DO   1 month ago Type 2 diabetes mellitus with diabetic neuropathy, without long-term current use of insulin (HCC)   [facility], [place], DO   4 months ago Lower leg edema   [facility] [name], [place]   5 months ago Essential hypertension   [facility] [name], [place]   6 months ago Osteoarthritis of knee (Left)   [facility] [name], [place]       Future Appointments             In 3 weeks Laural Benes, Oralia Rud, DO [employer], [facility]

## 2020-08-12 ENCOUNTER — Other Ambulatory Visit: Payer: Self-pay

## 2020-08-12 MED ORDER — VITAMIN D (ERGOCALCIFEROL) 1.25 MG (50000 UNIT) PO CAPS: 50000.0000 [IU] | ORAL_CAPSULE | ORAL | 0 refills | Status: DC

## 2020-08-12 NOTE — Telephone Encounter (Signed)
Refused by: Hyman Bible, CMA   Refusal reason: Refill not appropriate  Please specify what refill not appropriate mean.

## 2020-08-13 DIAGNOSIS — M47816 Spondylosis without myelopathy or radiculopathy, lumbar region: Secondary | ICD-10-CM | POA: Diagnosis not present

## 2020-08-13 DIAGNOSIS — G894 Chronic pain syndrome: Secondary | ICD-10-CM | POA: Diagnosis not present

## 2020-08-13 DIAGNOSIS — B372 Candidiasis of skin and nail: Secondary | ICD-10-CM | POA: Diagnosis not present

## 2020-08-13 DIAGNOSIS — M8580 Other specified disorders of bone density and structure, unspecified site: Secondary | ICD-10-CM | POA: Diagnosis not present

## 2020-08-13 DIAGNOSIS — I10 Essential (primary) hypertension: Secondary | ICD-10-CM | POA: Diagnosis not present

## 2020-08-13 DIAGNOSIS — M797 Fibromyalgia: Secondary | ICD-10-CM | POA: Diagnosis not present

## 2020-08-13 DIAGNOSIS — M1712 Unilateral primary osteoarthritis, left knee: Secondary | ICD-10-CM | POA: Diagnosis not present

## 2020-08-13 DIAGNOSIS — E1140 Type 2 diabetes mellitus with diabetic neuropathy, unspecified: Secondary | ICD-10-CM | POA: Diagnosis not present

## 2020-08-13 DIAGNOSIS — M7060 Trochanteric bursitis, unspecified hip: Secondary | ICD-10-CM | POA: Diagnosis not present

## 2020-08-16 DIAGNOSIS — M797 Fibromyalgia: Secondary | ICD-10-CM | POA: Diagnosis not present

## 2020-08-16 DIAGNOSIS — B372 Candidiasis of skin and nail: Secondary | ICD-10-CM | POA: Diagnosis not present

## 2020-08-16 DIAGNOSIS — G894 Chronic pain syndrome: Secondary | ICD-10-CM | POA: Diagnosis not present

## 2020-08-16 DIAGNOSIS — E1140 Type 2 diabetes mellitus with diabetic neuropathy, unspecified: Secondary | ICD-10-CM | POA: Diagnosis not present

## 2020-08-16 DIAGNOSIS — I10 Essential (primary) hypertension: Secondary | ICD-10-CM | POA: Diagnosis not present

## 2020-08-16 DIAGNOSIS — M1712 Unilateral primary osteoarthritis, left knee: Secondary | ICD-10-CM | POA: Diagnosis not present

## 2020-08-16 DIAGNOSIS — M47816 Spondylosis without myelopathy or radiculopathy, lumbar region: Secondary | ICD-10-CM | POA: Diagnosis not present

## 2020-08-16 DIAGNOSIS — M7060 Trochanteric bursitis, unspecified hip: Secondary | ICD-10-CM | POA: Diagnosis not present

## 2020-08-16 DIAGNOSIS — M8580 Other specified disorders of bone density and structure, unspecified site: Secondary | ICD-10-CM | POA: Diagnosis not present

## 2020-09-03 ENCOUNTER — Ambulatory Visit: Payer: Self-pay | Admitting: General Practice

## 2020-09-03 ENCOUNTER — Ambulatory Visit: Payer: Medicare HMO | Admitting: Family Medicine

## 2020-09-03 ENCOUNTER — Telehealth: Payer: Self-pay

## 2020-09-03 DIAGNOSIS — F419 Anxiety disorder, unspecified: Secondary | ICD-10-CM

## 2020-09-03 DIAGNOSIS — I10 Essential (primary) hypertension: Secondary | ICD-10-CM

## 2020-09-03 DIAGNOSIS — M797 Fibromyalgia: Secondary | ICD-10-CM

## 2020-09-03 DIAGNOSIS — G8929 Other chronic pain: Secondary | ICD-10-CM

## 2020-09-03 DIAGNOSIS — F3342 Major depressive disorder, recurrent, in full remission: Secondary | ICD-10-CM

## 2020-09-03 DIAGNOSIS — M545 Low back pain, unspecified: Secondary | ICD-10-CM

## 2020-09-03 DIAGNOSIS — E1140 Type 2 diabetes mellitus with diabetic neuropathy, unspecified: Secondary | ICD-10-CM

## 2020-09-03 NOTE — Chronic Care Management (AMB) (Signed)
Chronic Care Management   Follow Up Note   09/03/2020 Name: Cynthia Jones MRN: [id number] DOB: December 25, 1940  Referred by: Volney American, PA-C Reason for referral : Chronic Care Management (RNCM: Follow up for Chronic Disease Management and Care Coordination Needs)   Cynthia Jones is a 80 y.o. year old female who is a primary care patient of Volney American, [place]. The CCM team was consulted for assistance with chronic disease management and care coordination needs.    Review of patient status, including review of consultants reports, relevant laboratory and other test results, and collaboration with appropriate care team members and the patient's provider was performed as part of comprehensive patient evaluation and provision of chronic care management services.    SDOH (Social Determinants of Health) assessments performed: Yes See Care Plan activities for detailed interventions related to [facility])     Outpatient Encounter Medications as of 09/03/2020  Medication Sig   ARIPiprazole (ABILIFY) 5 MG tablet TAKE 1 TABLET BY MOUTH EVERY DAY   b complex vitamins tablet Take 1 tablet by mouth daily.   carbidopa-levodopa (SINEMET IR) 25-100 MG tablet Take 1 tablet by mouth in the morning, at noon, and at bedtime.   carvedilol (COREG) 6.25 MG tablet TAKE 1 TABLET BY MOUTH TWICE A DAY WITH A MEAL   cyclobenzaprine (FLEXERIL) 5 MG tablet TAKE 1 TABLET BY MOUTH THREE TIMES A DAY AS NEEDED FOR MUSCLE SPASMS   diclofenac Sodium (VOLTAREN) 1 % GEL Apply 2 g topically as needed.    DULoxetine (CYMBALTA) 60 MG capsule TAKE 1 CAPSULE BY MOUTH EVERY DAY   esomeprazole (NEXIUM) 40 MG capsule Take 40 mg by mouth as needed.    hydrochlorothiazide (HYDRODIURIL) 12.5 MG tablet TAKE 1 TABLET BY MOUTH EVERY DAY   levothyroxine (SYNTHROID) 50 MCG tablet TAKE 1 TABLET BY MOUTH EVERY DAY   losartan (COZAAR) 100 MG tablet Take 1 tablet (100 mg total) by mouth daily.   nystatin  (MYCOSTATIN/NYSTOP) powder APPLY TO AFFECTED AREA 3 TIMES A DAY   pregabalin (LYRICA) 50 MG capsule Take 1 capsule (50 mg total) by mouth 2 (two) times daily.   sucralfate (CARAFATE) 1 g tablet Take 1 tablet (1 g total) by mouth 4 (four) times daily -  with meals and at bedtime.   traMADol (ULTRAM) 50 MG tablet Take 1 tablet (50 mg total) by mouth 3 (three) times daily as needed for severe pain. Must last 30 days   UNABLE TO FIND Take by mouth as needed. Coriciden HBP  (Patient not taking: Reported on 08/06/2020)   UNABLE TO FIND Take by mouth daily. CBD gummies  (Patient not taking: Reported on 08/06/2020)   Vitamin D, Ergocalciferol, (DRISDOL) 1.25 MG (50000 UNIT) CAPS capsule Take 1 capsule (50,000 Units total) by mouth every 7 (seven) days.   No facility-administered encounter medications on file as of 09/03/2020.     Objective:   Goals Addressed              This Visit's Progress     Monitor and Manage My Blood Sugar-Diabetes Type 2        COMPLETED: RNCM: Mom is having a really hard time managing her pain and discomfort        CARE PLAN ENTRY (see longtitudinal plan of care for additional care plan information)  Current Barriers: Closing this goal. New goal in ELS  Knowledge Deficits related to how to effectively manage pain as evidence by unresolved pain in patient with chronic  pain syndrome, depression and anxiety  Unresolved grief, depression and anxiety as evidence of losing her spouse and having to move in with her daughter, recent death of her last living sibling on 10/29/2019, the patient is back in her home setting and happy  Nurse Case Manager Clinical Goal(s):   Over the next 120 days, patient will verbalize understanding of plan for managing pain effectively, having decreased episodes of depression and anxiety  Over the next 120 days, patient will work with [facility] and pcp to address needs related to pain relief measures and dealing with her depression and anxiety    Over the next 120 days, patient will demonstrate a decrease in pain exacerbations as evidenced by controlled pain  Over the next 120 days, patient will attend all scheduled medical appointments: [place] office staff to make an appointment for the patient to follow up with pcp  Interventions:   Evaluation of current treatment plan related to pain control and patient's adherence to plan as established by provider. 03-29-2020: The patient reports her pain is much better. She says the right knee hardly hurts at all the left knee is at 75% better. Has been getting shots in her knees and can see a huge difference. The patient also is ambulating without her walker and doing well. Her son Ronalee Belts says since she has moved back home she is doing very well. 07-12-2020: The patient states that her pain and discomfort are stable at this time. The shots have helped but not completely taken the pain away. She is working with pt and that is helping her with strengthening and weakness.   Provided education to patient re: if pain worsens or changes in level of intensity to call the MD.  Reviewed medications with patient and discussed compliance- 03-28-2020: The patient son states that since she has come back home and come off some of the medications she was on he has his "mama back". She is canning green beans, walking without her walker, not drooling on herself.  She is happy and doing well. He says she is more coherent and not "out of it" like she was. He says bringing her back to her home that she had his dad shared has been very good for the patient. They take her out to breakfast if she wants to go and she goes out on the porch and sits and does things she use to do without difficulty. 07-12-2020: The patient is still in her home and is happy. She was with her daughter and they were going to visit her other daughter.  She feels she is stable at this time.   Evaluation of home health services. 03-29-2020: The patient has  worked with PT/ST and has an aide that comes in and assist with basic needs. The patient per the son is more independent since she is back home from his sisters house. She came back to [facility] on June 28th. 07-12-2020: The patient continue to work with PT and that is helping her a lot. She sees great benefit from it.     Reviewed scheduled/upcoming provider appointments including: next appointment with pcp on 09-03-2020 at 1:40 pm.  Pharmacy consult for support and recommendations- completed.  The pharmacist is available for assistance as needed   Social worker support for effective management of grief and depression.  The patient is currently working with the LCSW   Patient Self Care Activities:   Patient verbalizes understanding of plan to work with the interdisciplinary team to help patient  manage her pain, depression and anxiety more effectively  Self administers medications as prescribed  Attends all scheduled provider appointments  Calls provider office for new concerns or questions  Unable to independently manage pain, depression and anxiety related to chronic conditions  Unable to perform IADLs independently  Please see past updates related to this goal by clicking on the "Past Updates" button in the selected goal        COMPLETED: RNCM: PT:"I am so happy to be back home" (pt-stated)        Current Barriers: Closing this goal- new care plan in ELS  Chronic Disease Management support, education, and care coordination needs related to HTN, DMII, Anxiety, Depression, and Chronic Pain  Grief and mourning over the loss of her husband and having to move in with her daughter November 13, 2022 is her husbands death anniversary). The patients last living sibling passed away on 11-17-2019. Mar 16, 2020 the patient moved back to the home she and her husband shared and her 2 sons are taking care of her. They live next door.   Clinical Goal(s) related to HTN, DMII, Anxiety, Depression, and Chronic Pain  :  Over the next 120 days, patient will:   Work with the care management team to address educational, disease management, and care coordination needs   Begin or continue self health monitoring activities as directed today Measure and record blood pressure 4 times per week  Call provider office for new or worsened signs and symptoms Blood pressure findings outside established parameters and New or worsened symptom related to Depression, Anxiety or Chronic Pain  Call care management team with questions or concerns  Verbalize basic understanding of patient centered plan of care established today  Interventions related to HTN, DMII, Anxiety, Depression, and Chronic Pain :   Evaluation of current treatment plans and patient's adherence to plan as established by provider.  The patient is doing well and being compliant with the plan of care. 07-12-2020: The patient saw pcp on 06-13-2020 and her hemoglobin A1C was 7.0.  She declined medication but has changed her diet and is trying to do more activity. She has cut out the ice cream. She verbalized she is doing what she is supposed to do.   Assessed patient understanding of disease states. 03-29-2020: Spoke with the patient and her son Kathlene November.  The patient has been back to her home since 03-16-20 and has made significant improvement in her health and well being. The patient has not had any new falls. Is off of medications that are making her "out of it", has her pain under control, is cooking and canning green beans, and is walking without her walker. The patient expressed how happy she was to be back home. 07-12-2020: Has a good support system with her family who help her manage her care.   Assessed patient's education and care coordination needs.  The patient denies any new concerns at this time. The patient states she is enjoying "porch sitting" since the weather has gotten nicer. 03-29-2020: The patient is walking out in the yard and filling up the bird  feeders. Her son says "I have my mama back". 07-12-2020: SDOH reviewed the patient denies any issues at this time. Was happy that she was spending time with her daughter who was taking her to visit her other daughter.   Provided disease specific education to patient.  The patient reports a good appetite. The patient denies any unintentional weight loss. 03-29-2020: The patients son cooks for her a  lot and he has finally gotten her to stop eating ice cream everyday. They have went with a sugar free option. He is working on helping the patient lose weight. He knows this will help with her pain in her knees as well. The patient has improved significantly per the son and positive things since the patient has moved back home. 07-12-2020: Education on watching sugars and salt. The patient states she is doing better with adhering to a special diet: Heart Healthy/ADA  Collaborated with appropriate clinical care team members regarding patient needs.  The patient continues to work with the CCM team and knows when to reach out for questions or concerns.   Advised the patient and the patient's son to take medications as prescribed. The son is managing her medications and said she had not been taking the xanax properly and that is why she was "out of it" all the time. The patient only takes it as needed and the son says she has not taken hardly any since being home.   Patient Self Care Activities related to HTN, DMII, Anxiety, Depression, and Chronic Pain :   Patient is unable to independently self-manage chronic health conditions  Please see past updates related to this goal by clicking on the "Past Updates" button in the selected goal         Patient Care Plan: RNCM: Depression (Adult)    Problem Identified: RNCM Anxiety and Depression Identification (Depression)   Priority: Medium    Goal: RNCM: Anxiety and Depressive Symptoms Identified   Priority: Medium  Note:   Current Barriers:   Knowledge  Deficits related to management of management of anxiety and depression  Chronic Disease Management support and education needs related to unresolved anxiety and depression  Lacks caregiver support.   Transportation barriers  Non-adherence to scheduled provider appointments  Unable to independently manage depression and anxiety- loss of husband in death, has had a lot of change in the last couple of years. Back at the home she shared with her husband and this is helping a lot.   Does not attend all scheduled provider appointments  Does not contact provider office for questions/concerns  Nurse Case Manager Clinical Goal(s):   Over the next 120 days, patient will verbalize understanding of plan for management of depression and anxiety  Over the next 120 days, patient will attend all scheduled medical appointments: 09-12-2020 at 1 pm  Over the next 120 days, patient will demonstrate improved adherence to prescribed treatment plan for anxiety and depression as evidenced byexpressing feelings to the CCM team, calling the office for changes in mood/anxiety/depression, following recommendations to pcp and utilization of support systems   Interventions:   1:1 collaboration with [name], PA-C regarding development and update of comprehensive plan of care as evidenced by provider attestation and co-signature  Inter-disciplinary care team collaboration (see longitudinal plan of care)  Evaluation of current treatment plan related to anxiety and depression  and patient's adherence to plan as established by provider.  Advised patient to call the office for changes in mood/anxiety level/ or increased depressive state  Provided education to patient re: support for anxiety and depression and alternative methods such as mindfulness and relaxation to help with periods of anxiety and depression  Discussed plans with patient for ongoing care management follow up and provided patient with  direct contact information for care management team  Reviewed scheduled/upcoming provider appointments including: 09-12-2020 at 1 pm   Patient Goals/Self-Care Activities Over the next 120  days, patient will:  - Patient will self administer medications as prescribed Patient will attend all scheduled provider appointments Patient will call pharmacy for medication refills Patient will attend church or other social activities Patient will call provider office for new concerns or questions Patient will work with BSW to address care coordination needs and will continue to work with the clinical team to address health care and disease management related needs.   - anxiety screen reviewed - depression screen reviewed - medication list reviewed  Follow Up Plan: Telephone follow up appointment with care management team member scheduled for: 11-05-2020 at 1:45 pm       Task: RNCM: Identify Depressive Symptoms and Facilitate Treatment   Note:   Care Management Activities:    - anxiety screen reviewed - depression screen reviewed - medication list reviewed       Patient Care Plan: RNCM: Chronic Pain (Adult)    Problem Identified: RNCM: Pain Management Plan (Chronic Pain)   Priority: Medium    Goal: RNCM: Pain Management Plan Developed   Priority: Medium  Note:   Current Barriers:   Knowledge Deficits related to managing acute/chronic pain  Non-adherence to scheduled provider appointments  Non-adherence to prescribed medication regimen  Difficulty obtaining medications  Chronic Disease Management support and education needs related to chronic pain  Unable to independently manage chronic pain and discomfort   Does not attend all scheduled provider appointments  Does not contact provider office for questions/concerns  Nurse Case Manager Clinical Goal(s):   Over the next 120 days, patient will verbalize understanding of plan for managing pain  Over the next 120 days, patient  will attend all scheduled medical appointments: 09-12-2020 at 1 pm  Over the next 120   days patient will demonstrate use of different relaxation  skills and/or diversional activities to assist with pain reduction (distraction, imagery, relaxation, massage, acupressure, TENS, heat, and cold application  Over the next 120    days patient will report pain at a level less than 3 to 4 on a 10-10 rating scale  Over the next  120  days patient will use pharmacological and nonpharmacological pain relief strategies  Over the next 120  days patient will verbalize acceptable level of pain relief and ability to engage in desired activities  Over the next  120 days patient will engage in desired activities without an increase in pain level  Interventions:   - deep breathing, relaxation and mindfulness use promoted  - effectiveness of pharmacologic therapy monitored  - misuse of pain medication assessed  - motivation and barriers to change assessed and addressed  - mutually acceptable comfort goal set  - pain assessed  - pain treatment goals reviewed  Evaluation of current treatment plan related to Chronic pain and discomfort  and patient's adherence to plan as established by provider.  Advised patient to call the provider for worsening pain or discomfort  Provided education to patient re: alternative pain relief measure  Reviewed medications with patient and discussed compliance.  The patient states that her pain is well controlled at this time. Is still going to pain management   Collaborated with CCM team and pcp regarding management of pain and discomfort effectively  Discussed plans with patient for ongoing care management follow up and provided patient with direct contact information for care management team  Allow patient to maintain a diary of pain ratings, timing, precipitating events, medications, treatments, and what works best to relieve pain,   Refer to support groups  and  self-help groups  Educate patient about the use of pharmacological interventions for pain management- antianxiety, antidepressants, NSAIDS, opioid analgesics,   Explain the importance of lifestyle modifications to effective pain management   Patient Goals/Self Care Activities:   Patient verbalizes understanding of plan to manage chronic pain and discomfort   Self-administers medications as prescribed  Attends all scheduled provider appointments  Calls pharmacy for medication refills  Calls provider office for new concerns or questions  - mutually acceptable comfort goal set  - pain assessed  - pain management plan developed  - pain treatment goals reviewed  - patient response to treatment assessed  - sharing of pain management plan with teachers and other caregivers encouraged  Follow Up Plan: Telephone follow up appointment with care management team member scheduled for: 11-05-2020 at 1:45 pm     Task: RNCM: Partner to Develop Chronic Pain Management Plan   Note:   Care Management Activities:    - mutually acceptable comfort goal set - pain assessed - pain management plan developed - pain treatment goals reviewed - patient response to treatment assessed - sharing of pain management plan with teachers and other caregivers encouraged       Patient Care Plan: RNCM: Hypertension (Adult)    Problem Identified: RNCM: Hypertension (Hypertension)   Priority: Medium    Goal: RNCM: Hypertension Monitored   Priority: Medium  Note:   Objective:   Last practice recorded BP readings:  BP Readings from Last 3 Encounters:  06/25/20 138/63  06/13/20 (!) 159/88  06/11/20 132/70     Most recent eGFR/CrCl: No results found for: EGFR  No components found for: CRCL Current Barriers:   Knowledge Deficits related to basic understanding of hypertension pathophysiology and self care management  Knowledge Deficits related to understanding of medications prescribed for  management of hypertension  Non-adherence to scheduled provider appointments  Transportation barriers  Unable to independently manage HTN  Does not attend all scheduled provider appointments  Does not contact provider office for questions/concerns Case Manager Clinical Goal(s):   Over the next 120 days, patient will verbalize understanding of plan for hypertension management  Over the next 120 days, patient will attend all scheduled medical appointments: 09-12-2020 at 1 pm  Over the next 120 days, patient will demonstrate improved adherence to prescribed treatment plan for hypertension as evidenced by taking all medications as prescribed, monitoring and recording blood pressure as directed, adhering to low sodium/DASH diet  Over the next 120 days, patient will demonstrate improved health management independence as evidenced by checking blood pressure as directed and notifying PCP if SBP>160 or DBP > 90, taking all medications as prescribe, and adhering to a low sodium diet as discussed.  Over the next 120 days, patient will verbalize basic understanding of hypertension disease process and self health management plan as evidenced by compliance with recommendations by the provider Interventions:   Evaluation of current treatment plan related to hypertension self management and patient's adherence to plan as established by provider.  Provided education to patient re: stroke prevention, s/s of heart attack and stroke, DASH diet, complications of uncontrolled blood pressure  Reviewed medications with patient and discussed importance of compliance  Discussed plans with patient for ongoing care management follow up and provided patient with direct contact information for care management team  Advised patient, providing education and rationale, to monitor blood pressure daily and record, calling PCP for findings outside established parameters.   Reviewed scheduled/upcoming provider  appointments including: 09-12-2020 at 1  pm Patient Goals/Self-Care Activities  Over the next 120 days, patient will:  - Attends all scheduled provider appointments Calls provider office for new concerns, questions, or BP outside discussed parameters Checks BP and records as discussed Follows a low sodium diet/DASH diet - depression screen reviewed - home or ambulatory blood pressure monitoring encouraged Follow Up Plan: Telephone follow up appointment with care management team member scheduled for: 11-05-2020 at 1:45 pm    Task: RNCM: Identify and Monitor Blood Pressure Elevation   Note:   Care Management Activities:    - depression screen reviewed - home or ambulatory blood pressure monitoring encouraged       Patient Care Plan: RNCM: Diabetes Type 2 (Adult)    Problem Identified: RNCM: Glycemic Management (Diabetes, Type 2)   Priority: Medium    Goal: RNCM: Glycemic Management Optimized   Priority: Medium  Note:   Objective:  Lab Results  Component Value Date   HGBA1C 7.0 (H) 06/13/2020    Lab Results  Component Value Date   CREATININE 0.89 03/01/2020   CREATININE 0.76 06/16/2019   CREATININE 0.74 03/09/2019     No results found for: EGFR Current Barriers:   Knowledge Deficits related to basic Diabetes pathophysiology and self care/management  Knowledge Deficits related to medications used for management of diabetes  Unable to independently manage DM  Does not attend all scheduled provider appointments Case Manager Clinical Goal(s):   Over the next 120 days, patient will demonstrate improved adherence to prescribed treatment plan for diabetes self care/management as evidenced by:   daily monitoring and recording of CBG   adherence to ADA/ carb modified diet  adherence to prescribed medication regimen Interventions:   Provided education to patient about basic DM disease process  Reviewed medications with patient and discussed importance of medication  adherence  Discussed plans with patient for ongoing care management follow up and provided patient with direct contact information for care management team  Provided patient with written educational materials related to hypo and hyperglycemia and importance of correct treatment  Reviewed scheduled/upcoming provider appointments including: 09-12-2020 at 1 pm  Advised patient, providing education and rationale, to check cbg BID and record, calling pcp for findings outside established parameters.    Review of patient status, including review of consultants reports, relevant laboratory and other test results, and medications completed. Patient Goals/Self-Care Activities  Over the next 120 days, patient will:  - UNABLE to independently manage DM as evidence of hemoglobin A1C 7.0 Self administers oral medications as prescribed Attends all scheduled provider appointments Checks blood sugars as prescribed and utilize hyper and hypoglycemia protocol as needed Adheres to prescribed ADA/carb modified - barriers to adherence to treatment plan identified - blood glucose monitoring encouraged - individualized medical nutrition therapy provided - resources required to improve adherence to care identified - self-awareness of signs/symptoms of hypo or hyperglycemia encouraged - use of blood glucose monitoring log promoted Follow Up Plan: Telephone follow up appointment with care management team member scheduled for: 11-05-2020 at 1:45 pm   Task: RNCM: Alleviate Barriers to Glycemic Management   Note:   Care Management Activities:    - barriers to adherence to treatment plan identified - blood glucose monitoring encouraged - individualized medical nutrition therapy provided - resources required to improve adherence to care identified - self-awareness of signs/symptoms of hypo or hyperglycemia encouraged - use of blood glucose monitoring log promoted         Plan:   Telephone follow up  appointment with care  management team member scheduled for: 11-05-2020 1:45 pm   [name] RN, MSN, [facility] Mobile: [phone]

## 2020-09-03 NOTE — Telephone Encounter (Signed)
Copied from CRM [id number]. Topic: General - Other >> Sep 03, 2020  9:16 AM Gwenlyn Fudge wrote: Reason for CRM: Pt calling stating that she has no transportation for her appt today. She is requesting to have her appt with Case Manager rescheduled in the afternoon. Please advise.

## 2020-09-03 NOTE — Patient Instructions (Signed)
Visit Information  Patient Care Plan: RNCM: Depression (Adult)    Problem Identified: RNCM Anxiety and Depression Identification (Depression)   Priority: Medium    Goal: RNCM: Anxiety and Depressive Symptoms Identified   Priority: Medium  Note:   Current Barriers:  [name] Knowledge Deficits related to management of management of anxiety and depression . Chronic Disease Management support and education needs related to unresolved anxiety and depression . Lacks caregiver support.  . Transportation barriers . Non-adherence to scheduled provider appointments . Unable to independently manage depression and anxiety- loss of husband in death, has had a lot of change in the last couple of years. Back at the home she shared with her husband and this is helping a lot.  . Does not attend all scheduled provider appointments . Does not contact provider office for questions/concerns  Nurse Case Manager Clinical Goal(s):  [name] Over the next 120 days, patient will verbalize understanding of plan for management of depression and anxiety . Over the next 120 days, patient will attend all scheduled medical appointments: 09-12-2020 at 1 pm . Over the next 120 days, patient will demonstrate improved adherence to prescribed treatment plan for anxiety and depression as evidenced byexpressing feelings to the CCM team, calling the office for changes in mood/anxiety/depression, following recommendations to pcp and utilization of support systems   Interventions:  . 1:1 collaboration with Volney American, PA-C regarding development and update of comprehensive plan of care as evidenced by provider attestation and co-signature . Inter-disciplinary care team collaboration (see longitudinal plan of care) . Evaluation of current treatment plan related to anxiety and depression  and patient's adherence to plan as established by provider. . Advised patient to call the office for changes in mood/anxiety level/ or increased  depressive state . Provided education to patient re: support for anxiety and depression and alternative methods such as mindfulness and relaxation to help with periods of anxiety and depression . Discussed plans with patient for ongoing care management follow up and provided patient with direct contact information for care management team . Reviewed scheduled/upcoming provider appointments including: 09-12-2020 at 1 pm   Patient Goals/Self-Care Activities Over the next 120 days, patient will:  - Patient will self administer medications as prescribed Patient will attend all scheduled provider appointments Patient will call pharmacy for medication refills Patient will attend church or other social activities Patient will call provider office for new concerns or questions Patient will work with BSW to address care coordination needs and will continue to work with the clinical team to address health care and disease management related needs.   - anxiety screen reviewed - depression screen reviewed - medication list reviewed  Follow Up Plan: Telephone follow up appointment with care management team member scheduled for: 11-05-2020 at 1:45 pm       Task: RNCM: Identify Depressive Symptoms and Facilitate Treatment   Note:   Care Management Activities:    - anxiety screen reviewed - depression screen reviewed - medication list reviewed       Patient Care Plan: RNCM: Chronic Pain (Adult)    Problem Identified: RNCM: Pain Management Plan (Chronic Pain)   Priority: Medium    Goal: RNCM: Pain Management Plan Developed   Priority: Medium  Note:   Current Barriers:  [name] Knowledge Deficits related to managing acute/chronic pain . Non-adherence to scheduled provider appointments . Non-adherence to prescribed medication regimen . Difficulty obtaining medications . Chronic Disease Management support and education needs related to chronic pain . Unable to  independently manage chronic pain and  discomfort  . Does not attend all scheduled provider appointments . Does not contact provider office for questions/concerns  Nurse Case Manager Clinical Goal(s):  [name] Over the next 120 days, patient will verbalize understanding of plan for managing pain . Over the next 120 days, patient will attend all scheduled medical appointments: 09-12-2020 at 1 pm . Over the next 120   days patient will demonstrate use of different relaxation  skills and/or diversional activities to assist with pain reduction (distraction, imagery, relaxation, massage, acupressure, TENS, heat, and cold application . Over the next 120    days patient will report pain at a level less than 3 to 4 on a 10-10 rating scale . Over the next  120  days patient will use pharmacological and nonpharmacological pain relief strategies . Over the next 120  days patient will verbalize acceptable level of pain relief and ability to engage in desired activities . Over the next  120 days patient will engage in desired activities without an increase in pain level  Interventions:  . - deep breathing, relaxation and mindfulness use promoted . - effectiveness of pharmacologic therapy monitored . - misuse of pain medication assessed . - motivation and barriers to change assessed and addressed . - mutually acceptable comfort goal set . - pain assessed . - pain treatment goals reviewed . Evaluation of current treatment plan related to Chronic pain and discomfort  and patient's adherence to plan as established by provider. . Advised patient to call the provider for worsening pain or discomfort . Provided education to patient re: alternative pain relief measure . Reviewed medications with patient and discussed compliance.  The patient states that her pain is well controlled at this time. Is still going to pain management  . Collaborated with CCM team and pcp regarding management of pain and discomfort effectively . Discussed plans with patient for  ongoing care management follow up and provided patient with direct contact information for care management team . Allow patient to maintain a diary of pain ratings, timing, precipitating events, medications, treatments, and what works best to relieve pain,  . Refer to support groups and self-help groups . Educate patient about the use of pharmacological interventions for pain management- antianxiety, antidepressants, NSAIDS, opioid analgesics,  . Explain the importance of lifestyle modifications to effective pain management   Patient Goals/Self Care Activities:  . Patient verbalizes understanding of plan to manage chronic pain and discomfort  . Self-administers medications as prescribed . Attends all scheduled provider appointments . Calls pharmacy for medication refills . Calls provider office for new concerns or questions . - mutually acceptable comfort goal set . - pain assessed . - pain management plan developed . - pain treatment goals reviewed . - patient response to treatment assessed . - sharing of pain management plan with teachers and other caregivers encouraged  Follow Up Plan: Telephone follow up appointment with care management team member scheduled for: 11-05-2020 at 1:45 pm     Task: RNCM: Partner to Develop Chronic Pain Management Plan   Note:   Care Management Activities:    - mutually acceptable comfort goal set - pain assessed - pain management plan developed - pain treatment goals reviewed - patient response to treatment assessed - sharing of pain management plan with teachers and other caregivers encouraged       Patient Care Plan: RNCM: Hypertension (Adult)    Problem Identified: RNCM: Hypertension (Hypertension)   Priority: Medium  Goal: RNCM: Hypertension Monitored   Priority: Medium  Note:   Objective:  . Last practice recorded BP readings:  BP Readings from Last 3 Encounters:  06/25/20 138/63  06/13/20 (!) 159/88  06/11/20 132/70 .   [name] Most  recent eGFR/CrCl: No results found for: EGFR  No components found for: CRCL Current Barriers:  [name] Knowledge Deficits related to basic understanding of hypertension pathophysiology and self care management . Knowledge Deficits related to understanding of medications prescribed for management of hypertension . Non-adherence to scheduled provider appointments . Transportation barriers . Unable to independently manage HTN . Does not attend all scheduled provider appointments . Does not contact provider office for questions/concerns Case Manager Clinical Goal(s):  [name] Over the next 120 days, patient will verbalize understanding of plan for hypertension management . Over the next 120 days, patient will attend all scheduled medical appointments: 09-12-2020 at 1 pm . Over the next 120 days, patient will demonstrate improved adherence to prescribed treatment plan for hypertension as evidenced by taking all medications as prescribed, monitoring and recording blood pressure as directed, adhering to low sodium/DASH diet . Over the next 120 days, patient will demonstrate improved health management independence as evidenced by checking blood pressure as directed and notifying PCP if SBP>160 or DBP > 90, taking all medications as prescribe, and adhering to a low sodium diet as discussed. . Over the next 120 days, patient will verbalize basic understanding of hypertension disease process and self health management plan as evidenced by compliance with recommendations by the provider Interventions:  . Evaluation of current treatment plan related to hypertension self management and patient's adherence to plan as established by provider. . Provided education to patient re: stroke prevention, s/s of heart attack and stroke, DASH diet, complications of uncontrolled blood pressure . Reviewed medications with patient and discussed importance of compliance . Discussed plans with patient for ongoing care management follow up  and provided patient with direct contact information for care management team . Advised patient, providing education and rationale, to monitor blood pressure daily and record, calling PCP for findings outside established parameters.  . Reviewed scheduled/upcoming provider appointments including: 09-12-2020 at 1 pm Patient Goals/Self-Care Activities . Over the next 120 days, patient will:  - Attends all scheduled provider appointments Calls provider office for new concerns, questions, or BP outside discussed parameters Checks BP and records as discussed Follows a low sodium diet/DASH diet - depression screen reviewed - home or ambulatory blood pressure monitoring encouraged Follow Up Plan: Telephone follow up appointment with care management team member scheduled for: 11-05-2020 at 1:45 pm    Task: RNCM: Identify and Monitor Blood Pressure Elevation   Note:   Care Management Activities:    - depression screen reviewed - home or ambulatory blood pressure monitoring encouraged       Patient Care Plan: RNCM: Diabetes Type 2 (Adult)    Problem Identified: RNCM: Glycemic Management (Diabetes, Type 2)   Priority: Medium    Goal: RNCM: Glycemic Management Optimized   Priority: Medium  Note:   Objective:  Lab Results  Component Value Date   HGBA1C 7.0 (H) 06/13/2020 .   Lab Results  Component Value Date   CREATININE 0.89 03/01/2020   CREATININE 0.76 06/16/2019   CREATININE 0.74 03/09/2019 .   [name] No results found for: EGFR Current Barriers:  [name] Knowledge Deficits related to basic Diabetes pathophysiology and self care/management . Knowledge Deficits related to medications used for management of diabetes . Unable to independently manage  DM . Does not attend all scheduled provider appointments Case Manager Clinical Goal(s):  [name] Over the next 120 days, patient will demonstrate improved adherence to prescribed treatment plan for diabetes self care/management as evidenced by:  . daily  monitoring and recording of CBG  . adherence to ADA/ carb modified diet . adherence to prescribed medication regimen Interventions:  . Provided education to patient about basic DM disease process . Reviewed medications with patient and discussed importance of medication adherence . Discussed plans with patient for ongoing care management follow up and provided patient with direct contact information for care management team . Provided patient with written educational materials related to hypo and hyperglycemia and importance of correct treatment . Reviewed scheduled/upcoming provider appointments including: 09-12-2020 at 1 pm . Advised patient, providing education and rationale, to check cbg BID and record, calling pcp for findings outside established parameters.   . Review of patient status, including review of consultants reports, relevant laboratory and other test results, and medications completed. Patient Goals/Self-Care Activities . Over the next 120 days, patient will:  - UNABLE to independently manage DM as evidence of hemoglobin A1C 7.0 Self administers oral medications as prescribed Attends all scheduled provider appointments Checks blood sugars as prescribed and utilize hyper and hypoglycemia protocol as needed Adheres to prescribed ADA/carb modified - barriers to adherence to treatment plan identified - blood glucose monitoring encouraged - individualized medical nutrition therapy provided - resources required to improve adherence to care identified - self-awareness of signs/symptoms of hypo or hyperglycemia encouraged - use of blood glucose monitoring log promoted Follow Up Plan: Telephone follow up appointment with care management team member scheduled for: 11-05-2020 at 1:45 pm   Task: RNCM: Alleviate Barriers to Glycemic Management   Note:   Care Management Activities:    - barriers to adherence to treatment plan identified - blood glucose monitoring encouraged -  individualized medical nutrition therapy provided - resources required to improve adherence to care identified - self-awareness of signs/symptoms of hypo or hyperglycemia encouraged - use of blood glucose monitoring log promoted         The patient verbalized understanding of instructions, educational materials, and care plan provided today and declined offer to receive copy of patient instructions, educational materials, and care plan.   Telephone follow up appointment with care management team member scheduled for: 11-05-2020 at 1:45 pm  [place], MSN, [facility] Mobile: [phone]

## 2020-09-06 ENCOUNTER — Telehealth: Payer: Self-pay

## 2020-09-06 ENCOUNTER — Telehealth: Payer: Self-pay | Admitting: Licensed Clinical Social Worker

## 2020-09-06 NOTE — Telephone Encounter (Signed)
  Chronic Care Management    Clinical Social Work General Follow Up Note  09/06/2020 Name: Cynthia Jones MRN: [id number] DOB: 1940/10/02  Cynthia Jones is a 80 y.o. year old female who is a primary care patient of [name], [place]. The CCM team was consulted for assistance with Level of Care Concerns.   Review of patient status, including review of consultants reports, relevant laboratory and other test results, and collaboration with appropriate care team members and the patient's provider was performed as part of comprehensive patient evaluation and provision of chronic care management services.    LCSW completed CCM outreach attempt today to both patient and son but was unable to reach either one successfully. A HIPPA compliant voice message was unable to be left for both as their voice mail box had not been set up yet. LCSW will ask Scheduling Care Guide to reschedule CCM SW appointment with patient as well.  Outpatient Encounter Medications as of 09/06/2020  Medication Sig  . ARIPiprazole (ABILIFY) 5 MG tablet TAKE 1 TABLET BY MOUTH EVERY DAY  . b complex vitamins tablet Take 1 tablet by mouth daily.  . carbidopa-levodopa (SINEMET IR) 25-100 MG tablet Take 1 tablet by mouth in the morning, at noon, and at bedtime.  . carvedilol (COREG) 6.25 MG tablet TAKE 1 TABLET BY MOUTH TWICE A DAY WITH A MEAL  . cyclobenzaprine (FLEXERIL) 5 MG tablet TAKE 1 TABLET BY MOUTH THREE TIMES A DAY AS NEEDED FOR MUSCLE SPASMS  . diclofenac Sodium (VOLTAREN) 1 % GEL Apply 2 g topically as needed.   . DULoxetine (CYMBALTA) 60 MG capsule TAKE 1 CAPSULE BY MOUTH EVERY DAY  . esomeprazole (NEXIUM) 40 MG capsule Take 40 mg by mouth as needed.   . hydrochlorothiazide (HYDRODIURIL) 12.5 MG tablet TAKE 1 TABLET BY MOUTH EVERY DAY  . levothyroxine (SYNTHROID) 50 MCG tablet TAKE 1 TABLET BY MOUTH EVERY DAY  . losartan (COZAAR) 100 MG tablet Take 1 tablet (100 mg total) by mouth daily.  [name] nystatin  (MYCOSTATIN/NYSTOP) powder APPLY TO AFFECTED AREA 3 TIMES A DAY  . pregabalin (LYRICA) 50 MG capsule Take 1 capsule (50 mg total) by mouth 2 (two) times daily.  . sucralfate (CARAFATE) 1 g tablet Take 1 tablet (1 g total) by mouth 4 (four) times daily -  with meals and at bedtime.  . traMADol (ULTRAM) 50 MG tablet Take 1 tablet (50 mg total) by mouth 3 (three) times daily as needed for severe pain. Must last 30 days  . UNABLE TO FIND Take by mouth as needed. Coriciden HBP  (Patient not taking: Reported on 08/06/2020)  . UNABLE TO FIND Take by mouth daily. CBD gummies  (Patient not taking: Reported on 08/06/2020)  . Vitamin D, Ergocalciferol, (DRISDOL) 1.25 MG (50000 UNIT) CAPS capsule Take 1 capsule (50,000 Units total) by mouth every 7 (seven) days.   No facility-administered encounter medications on file as of 09/06/2020.    Follow Up Plan: Scheduling Care Guide will reach out to patient to reschedule appointment.   Dickie La, BSW, MSW, LCSW [facility] [place]  [facility] [email or web address] Phone: [phone]

## 2020-09-10 NOTE — Telephone Encounter (Signed)
Patient has been rescheduled.

## 2020-09-11 ENCOUNTER — Ambulatory Visit: Payer: Medicare HMO | Admitting: Licensed Clinical Social Worker

## 2020-09-11 DIAGNOSIS — M797 Fibromyalgia: Secondary | ICD-10-CM

## 2020-09-11 DIAGNOSIS — F3342 Major depressive disorder, recurrent, in full remission: Secondary | ICD-10-CM

## 2020-09-11 DIAGNOSIS — E1140 Type 2 diabetes mellitus with diabetic neuropathy, unspecified: Secondary | ICD-10-CM

## 2020-09-11 DIAGNOSIS — F419 Anxiety disorder, unspecified: Secondary | ICD-10-CM

## 2020-09-11 NOTE — Chronic Care Management (AMB) (Signed)
Chronic Care Management    Clinical Social Work Note  09/11/2020 Name: Cynthia Jones MRN: [id number] DOB: 12-May-1941  Cynthia Jones is a 80 y.o. year old female who is a primary care patient of Volney American, [place]. The CCM team was consulted to assist the patient with chronic disease management and/or care coordination needs related to: Mental Health Counseling and Resources and Grief Counseling.   Engaged with patient by telephone for follow up visit in response to provider referral for social work chronic care management and care coordination services.   Consent to Services:  The patient was given the following information about Chronic Care Management services today, agreed to services, and gave verbal consent: 1. CCM service includes personalized support from designated clinical staff supervised by the primary care provider, including individualized plan of care and coordination with other care providers 2. 24/7 contact phone numbers for assistance for urgent and routine care needs. 3. Service will only be billed when office clinical staff spend 20 minutes or more in a month to coordinate care. 4. Only one practitioner may furnish and bill the service in a calendar month. 5.The patient may stop CCM services at any time (effective at the end of the month) by phone call to the office staff. 6. The patient will be responsible for cost sharing (co-pay) of up to 20% of the service fee (after annual deductible is met). Patient agreed to services and consent obtained.  Patient agreed to services and consent obtained.   Assessment/Interventions: Review of patient past medical history, allergies, medications, and health status, including review of relevant consultants reports was performed today as part of a comprehensive evaluation and provision of chronic care management and care coordination services.     SDOH (Social Determinants of Health) assessments and interventions performed:     Advanced Directives Status: See Care Plan for related entries.  CCM Care Plan  Allergies  Allergen Reactions  . Nsaids Other (See Comments)    GI upset, hx of H pylori and gastric ulcer  . Erythromycin Nausea Only    Outpatient Encounter Medications as of 09/11/2020  Medication Sig  . ARIPiprazole (ABILIFY) 5 MG tablet TAKE 1 TABLET BY MOUTH EVERY DAY  . b complex vitamins tablet Take 1 tablet by mouth daily.  . carbidopa-levodopa (SINEMET IR) 25-100 MG tablet Take 1 tablet by mouth in the morning, at noon, and at bedtime.  . carvedilol (COREG) 6.25 MG tablet TAKE 1 TABLET BY MOUTH TWICE A DAY WITH A MEAL  . cyclobenzaprine (FLEXERIL) 5 MG tablet TAKE 1 TABLET BY MOUTH THREE TIMES A DAY AS NEEDED FOR MUSCLE SPASMS  . diclofenac Sodium (VOLTAREN) 1 % GEL Apply 2 g topically as needed.   . DULoxetine (CYMBALTA) 60 MG capsule TAKE 1 CAPSULE BY MOUTH EVERY DAY  . esomeprazole (NEXIUM) 40 MG capsule Take 40 mg by mouth as needed.   . hydrochlorothiazide (HYDRODIURIL) 12.5 MG tablet TAKE 1 TABLET BY MOUTH EVERY DAY  . levothyroxine (SYNTHROID) 50 MCG tablet TAKE 1 TABLET BY MOUTH EVERY DAY  . losartan (COZAAR) 100 MG tablet Take 1 tablet (100 mg total) by mouth daily.  [name] nystatin (MYCOSTATIN/NYSTOP) powder APPLY TO AFFECTED AREA 3 TIMES A DAY  . pregabalin (LYRICA) 50 MG capsule Take 1 capsule (50 mg total) by mouth 2 (two) times daily.  . sucralfate (CARAFATE) 1 g tablet Take 1 tablet (1 g total) by mouth 4 (four) times daily -  with meals and at bedtime.  [name]  traMADol (ULTRAM) 50 MG tablet Take 1 tablet (50 mg total) by mouth 3 (three) times daily as needed for severe pain. Must last 30 days  . UNABLE TO FIND Take by mouth as needed. Coriciden HBP  (Patient not taking: Reported on 08/06/2020)  . UNABLE TO FIND Take by mouth daily. CBD gummies  (Patient not taking: Reported on 08/06/2020)  . Vitamin D, Ergocalciferol, (DRISDOL) 1.25 MG (50000 UNIT) CAPS capsule Take 1 capsule (50,000 Units  total) by mouth every 7 (seven) days.   No facility-administered encounter medications on file as of 09/11/2020.    Patient Active Problem List   Diagnosis Date Noted  . DDD (degenerative disc disease), lumbosacral 07/10/2020  . Chronic lower extremity pain (Bilateral) 07/10/2020  . Other intervertebral disc degeneration, lumbar region 07/10/2020  . Change in voice 07/02/2020  . Difficulty walking 07/02/2020  . Tremor 07/02/2020  . Thyroiditis 12/19/2019  . Chronic knee pain (Left) 07/05/2019  . Neurogenic pain 11/01/2018  . Morbid obesity with BMI of 40.0-44.9, adult ([place]) 09/20/2018  . Hypothyroid 09/15/2018  . Vitamin D deficiency 09/05/2018  . Elevated C-reactive protein (CRP) 08/09/2018  . Elevated sed rate 08/09/2018  . Pharmacologic therapy 08/08/2018  . Disorder of skeletal system 08/08/2018  . Problems influencing health status 08/08/2018  . Contusion of foot 03/09/2018  . Advanced care planning/counseling discussion 04/12/2017  . Lumbar spondylosis 10/07/2016  . Hypercholesteremia 09/14/2016  . Long term prescription opiate use 09/08/2016  . Long term prescription benzodiazepine use 09/08/2016  . Chronic pain syndrome 09/08/2016  . Chronic low back pain (2ry area of Pain) (Bilateral) (L>R) 09/08/2016  . Chronic knee pain (1ry area of Pain) (Bilateral) (L>R) 09/08/2016  . Cervicogenic headache 09/08/2016  . Chronic hip pain (Bilateral) 09/08/2016  . Chronic hand pain (Bilateral) (R>L) 09/08/2016  . Chronic foot pain (Left) 09/08/2016  . Insomnia 09/17/2015  . Folliculitis 16/08/930  . History of total knee replacement (Right) 04/17/2015  . Fibromyalgia 04/09/2015  . Bilateral occipital neuralgia 04/09/2015  . Greater trochanteric bursitis 04/09/2015  . Osteoarthritis of knee (Left) 04/09/2015  . Sacroiliac joint disease 04/09/2015  . Lumbar facet syndrome (Bilateral) (L>R) 04/09/2015  . Neuropathy due to secondary diabetes ([place]) 04/09/2015  . Osteopenia  03/06/2015  . Anxiety 03/06/2015  . Depression 03/06/2015  . Hypertensive disorder 03/06/2015  . Diabetes mellitus ([place]) 03/06/2015  . Osteoarthritis 03/06/2015  . Seasonal allergic rhinitis 03/06/2015  . Obesity 03/06/2015  . Intertrigo 03/06/2015    Conditions to be addressed/monitored: Anxiety, Depression and Dementia; Limited social support, ADL IADL limitations, Mental Health Concerns , Social Isolation and Limited access to caregiver  Care Plan : General Social Work (Adult)  Updates made by Greg Cutter, LCSW since 09/11/2020 12:00 AM    Problem: Health Promotion or Disease Self-Management (General Plan of Care)     Goal: Self-Management Plan Developed   Start Date: 09/11/2020  Priority: Medium  Note:   Evidence-based guidance:   Review biopsychosocial determinants of health screens.   Determine level of modifiable health risk.   Assess level of patient activation, level of readiness, importance and confidence to make changes.   Evoke change talk using open-ended questions, pros and cons, as well as looking forward.   Identify areas where behavior change may lead to improved health.   Partner with patient to develop a robust self-management plan that includes lifestyle factors, such as weight loss, exercise and healthy nutrition, as well as goals specific to disease risks.   Support patient and  family/caregiver active participation in decision-making and self-management plan.   Implement additional goals and interventions based on identified risk factors to reduce health risk.   Facilitate advance care planning.   Review need for preventive screening based on age, sex, family history and health history.   Notes:   Timeframe:  Long-Range Goal Priority:  Medium Start Date: 09/11/20                          Expected End Date: 11/09/20                   Follow Up Date-90 days from 09/11/20   - avoid negative self-talk - develop a personal safety plan - develop a  plan to deal with triggers like holidays, anniversaries - exercise at least 2 to 3 times per week - have a plan for how to handle bad days - journal feelings and what helps to feel better or worse - spend time or talk with others at least 2 to 3 times per week - spend time or talk with others every day - watch for early signs of feeling worse - write in journal every day    Why is this important?    Keeping track of your progress will help your treatment team find the right mix of medicine and therapy for you.   Write in your journal every day.   Day-to-day changes in depression symptoms are normal. It may be more helpful to check your progress at the end of each week instead of every day.     Current Barriers:  . Chronic Mental Health needs related to depression, anxiety and most recently- grief . Limited social support . ADL IADL limitations . Mental Health Concerns  . Social Isolation . Memory Deficits . Suicidal Ideation/Homicidal Ideation: No  Clinical Social Work Goal(s):  [name] Over the next 120 days, patient will work with SW bi-monthly by telephone or in person to reduce or manage symptoms related to depression, grief and anxiety . Over the next 120 days, patient will demonstrate improved health management independence as evidenced by implementing appropriate self-care and mental health coping tools into her daily routine to combat stressors.  Interventions: . Patient interviewed and appropriate assessments performed: brief mental health assessment . LCSW spoke with patient on 09/11/20. Patient reports that she is stable. She shares that she keeps the telephone by hear 24/7 in case of an emergency. She shares that she has large family that consist of 4 children, 4 grandchildren and 6 great grandchildren and with their assistance she is able to take appropriate care of herself. Patient is very adamant about not wanting long term placement. She shares that her daughter is now  residing with her since she lost her last aide. . Patient reports that recent medication adjustment by PCP has been helpful to her chronic pain. . Patient shares that she has been experiencing ongoing tremors (especially in her left hand) and PCP referred her to a neurologist. Dr. Melrose Nakayama had her start Sinemet 25/100 1 pill 3 times a day for this concern and patient reports that she has already seen a difference. She is hopeful that this medication will continue to be effective. . Patient has a strong family support network which includes her daughter who recently moved in with her, a son that resides with her but works often and another son that lives across from her. They all provide ongoing care giving and supervision but  patient continues to have level of care concerns as they are only able to do so much and patient is not agreeable to placement. . Patient shares that her granddaughter visits her often for socialization with her young great grandchildren.  . Family have made great efforts to accommodate patient and her safety needs within her home. They completed some home repairs in her bedroom to improve patient's living condition. Family reported in the past that they eventually want to build a walk in shower for patient as well.  . Patient shares that she often sits in her lift chair and watches television. Health self-care education provided to encourage patient to increase physical activity.  . Patient interviewed and appropriate assessments performed . Provided mental health counseling with regard to anxiety, depression, grief support. Patient lost her spouse of 66 years and brother. Patient denies wanting any mental health involvement or grief support. LCSW provided patient with information about available grief support resources within the area ([facility] and [facility]). LCSW sent an email in the past to family with this information in case she wishes to utilize these resources. Patient  worked with Pharmacist, community at [facility] in the past but discontinued services after 3 telephonic visits because family reported that patient was not willing to talk or open up with therapist. Patient declines needing mental health or grief support on 09/11/20.  [name] Discussed plans with patient for ongoing care management follow up and provided patient with direct contact information for care management team . Collaborated with primary care provider re: medication concerns . Assisted patient/caregiver with obtaining information about health plan benefits . Provided education and assistance to client regarding Advanced Directives. . Provided education to patient/caregiver regarding level of care options. . Provided education to patient/caregiver about Hospice and/or Palliative Care services . Patient declines any recent falls.  . Patient's daughter reported having to start therapy due to ongoing stress and caregiver burnout.   Patient Self Care Activities:  [name] Motivation for treatment . Strong family or social support  Patient Coping Strengths:  . Supportive Relationships . Family . Hopefulness  Patient Self Care Deficits:  . Lacks social connections . Unable to perform ADLs independently . Unable to perform IADLs independently  Please see past updates related to this goal by clicking on the "Past Updates" button in the selected goal     Task: Mutually Develop and Royce Macadamia Achievement of Patient Goals   Note:   Care Management Activities:    - advance care planning facilitated - barriers to meeting goals identified - change-talk evoked - choices provided - collaboration with team encouraged - decision-making supported - difficulty of making life-long changes acknowledged - health risks reviewed - problem-solving facilitated - questions answered - readiness for change evaluated - reassurance provided - resources needed to meet goals identified - self-reflection promoted -  self-reliance encouraged - verbalization of feelings encouraged    Notes:       Follow Up Plan: SW will follow up with patient by phone over the next quarter      Eula Fried, BSW, MSW, [email or web address] Phone: [phone]

## 2020-09-12 ENCOUNTER — Ambulatory Visit: Payer: Medicare HMO | Admitting: Family Medicine

## 2020-09-12 ENCOUNTER — Ambulatory Visit (INDEPENDENT_AMBULATORY_CARE_PROVIDER_SITE_OTHER): Payer: Medicare HMO | Admitting: Family Medicine

## 2020-09-12 ENCOUNTER — Encounter: Payer: Self-pay | Admitting: Family Medicine

## 2020-09-12 VITALS — Wt 245.0 lb

## 2020-09-12 DIAGNOSIS — E1140 Type 2 diabetes mellitus with diabetic neuropathy, unspecified: Secondary | ICD-10-CM

## 2020-09-12 DIAGNOSIS — Z20822 Contact with and (suspected) exposure to covid-19: Secondary | ICD-10-CM

## 2020-09-12 MED ORDER — BENZONATATE 200 MG PO CAPS: 200.0000 mg | ORAL_CAPSULE | Freq: Two times a day (BID) | ORAL | 0 refills | Status: DC | PRN

## 2020-09-12 MED ORDER — PREDNISONE 10 MG PO TABS: ORAL_TABLET | ORAL | 0 refills | Status: DC

## 2020-09-12 NOTE — Assessment & Plan Note (Signed)
Will get her in after her quarantine for A1c. Continue current regimen. Continue to monitor. Call with any concerns.

## 2020-09-12 NOTE — Progress Notes (Signed)
Wt 245 lb (111.1 kg)   BMI 47.85 kg/m    Subjective:    Patient ID: Cynthia Jones, female    DOB: 05-May-1941, 80 y.o.   MRN: [id number]  HPI: Cynthia Jones is a 80 y.o. female  Chief Complaint  Patient presents with  . Sinusitis    Pt states she has been sick for about a week, has drainage down her throat and its making it sore  . Diabetes   UPPER RESPIRATORY TRACT INFECTION Duration: about a week Worst symptom: congestion Fever: no Cough: yes Shortness of breath: yes Wheezing: yes Chest pain: no Chest tightness: no Chest congestion: no Nasal congestion: yes Runny nose: yes Post nasal drip: yes Sneezing: no Sore throat: yes Swollen glands: no Sinus pressure: yes Headache: yes Face pain: no Toothache: no Ear pain: no  Ear pressure: no  Eyes red/itching:no Eye drainage/crusting: no  Vomiting: no Rash: no Fatigue: yes Sick contacts: no Strep contacts: no  Context: stable Recurrent sinusitis: no Relief with OTC cold/cough medications: no  Treatments attempted: coricetin   DIABETES Hypoglycemic episodes:no Polydipsia/polyuria: yes Visual disturbance: no Chest pain: no Paresthesias: no Glucose Monitoring: no  Accucheck frequency: Not Checking Taking Insulin?: no Blood Pressure Monitoring: not checking Retinal Examination: Not up to Date Foot Exam: Not up to Date Diabetic Education: Completed Pneumovax: Up to Date Influenza: Up to Date Aspirin: yes   Relevant past medical, surgical, family and social history reviewed and updated as indicated. Interim medical history since our last visit reviewed. Allergies and medications reviewed and updated.  Review of Systems  Constitutional: Positive for fever. Negative for activity change, appetite change, chills, diaphoresis, fatigue and unexpected weight change.  HENT: Positive for congestion, postnasal drip, rhinorrhea, sinus pressure and sore throat. Negative for dental problem, drooling, ear  discharge, ear pain, facial swelling, hearing loss, mouth sores, nosebleeds, sinus pain, sneezing, tinnitus, trouble swallowing and voice change.   Eyes: Negative.   Respiratory: Positive for cough, shortness of breath and wheezing. Negative for apnea, choking, chest tightness and stridor.   Cardiovascular: Negative.   Gastrointestinal: Negative.   Psychiatric/Behavioral: Negative.     Per HPI unless specifically indicated above     Objective:    Wt 245 lb (111.1 kg)   BMI 47.85 kg/m   Wt Readings from Last 3 Encounters:  09/12/20 245 lb (111.1 kg)  06/25/20 259 lb (117.5 kg)  06/13/20 251 lb (113.9 kg)    Physical Exam Vitals and nursing note reviewed.  Pulmonary:     Effort: Pulmonary effort is normal. No respiratory distress.     Comments: Speaking in full sentences Neurological:     Mental Status: She is alert.  Psychiatric:        Mood and Affect: Mood normal.        Behavior: Behavior normal.        Thought Content: Thought content normal.        Judgment: Judgment normal.     Results for orders placed or performed in visit on 06/13/20  Bayer DCA Hb A1c Waived  Result Value Ref Range   HB A1C (BAYER DCA - WAIVED) 7.0 (H) <7.0 %      Assessment & Plan:   Problem List Items Addressed This Visit      Endocrine   Diabetes mellitus (HCC)    Will get her in after her quarantine for A1c. Continue current regimen. Continue to monitor. Call with any concerns.       Relevant  Orders   Bayer DCA Hb A1c Waived    Other Visit Diagnoses    Suspected COVID-19 virus infection    -  Primary   Unable to come in for swab. Will quarantine for full 14 days. Will treat for comfort with prednisone and tessalon. Call with any concerns or if getting worse.        Follow up plan: Return in about 3 months (around 12/11/2020).   . This visit was completed via telephone due to the restrictions of the COVID-19 pandemic. All issues as above were discussed and addressed but no  physical exam was performed. If it was felt that the patient should be evaluated in the office, they were directed there. The patient verbally consented to this visit. Patient was unable to complete an audio/visual visit due to Lack of equipment. Due to the catastrophic nature of the COVID-19 pandemic, this visit was done through audio contact only. . Location of the patient: home . Location of the provider: home . Those involved with this call:  . Provider: Olevia Perches, DO . CMA: Wilhemena Durie, CMA . Front Desk/Registration: Harriet Pho  . Time spent on call: 25 minutes on the phone discussing health concerns. 40 minutes total spent in review of patient's record and preparation of their chart.

## 2020-09-13 ENCOUNTER — Other Ambulatory Visit: Payer: Self-pay | Admitting: Nurse Practitioner

## 2020-09-13 ENCOUNTER — Telehealth: Payer: Self-pay

## 2020-09-13 ENCOUNTER — Other Ambulatory Visit: Payer: Self-pay | Admitting: Family Medicine

## 2020-09-13 MED ORDER — TIZANIDINE HCL 4 MG PO TABS: 4.0000 mg | ORAL_TABLET | Freq: Four times a day (QID) | ORAL | 2 refills | Status: DC | PRN

## 2020-09-13 NOTE — Telephone Encounter (Signed)
Requested medication (s) are due for refill today: no  Requested medication (s) are on the active medication list: yes  Last refill:  08/02/2020  Future visit scheduled: no  Notes to clinic:  this refill cannot be delegated    Requested Prescriptions  Pending Prescriptions Disp Refills   ARIPiprazole (ABILIFY) 5 MG tablet [Pharmacy Med Name: ARIPIPRAZOLE 5 MG TABLET] 90 tablet 1    Sig: TAKE 1 TABLET BY MOUTH EVERY DAY      Not Delegated - Psychiatry:  Antipsychotics - Second Generation (Atypical) - aripiprazole Failed - 09/13/2020 11:11 AM      Failed - This refill cannot be delegated      Passed - Valid encounter within last 6 months    Recent Outpatient Visits           Yesterday Suspected COVID-19 virus infection   [facility] [place], Megan P, DO   1 month ago Appointment canceled by hospital   [facility], Megan P, DO   3 months ago Type 2 diabetes mellitus with diabetic neuropathy, without long-term current use of insulin (HCC)   [facility] [place], [place], DO   5 months ago Lower leg edema   [facility] [name], [place]   6 months ago Essential hypertension   [facility] Roosvelt Maser [place], [place]                 Signed Prescriptions Disp Refills   levothyroxine (SYNTHROID) 50 MCG tablet 90 tablet 1    Sig: TAKE 1 TABLET BY MOUTH EVERY DAY      Endocrinology:  Hypothyroid Agents Failed - 09/13/2020 11:11 AM      Failed - TSH needs to be rechecked within 3 months after an abnormal result. Refill until TSH is due.      Passed - TSH in normal range and within 360 days    TSH  Date Value Ref Range Status  03/01/2020 2.630 0.450 - 4.500 uIU/mL Final          Passed - Valid encounter within last 12 months    Recent Outpatient Visits           Yesterday Suspected COVID-19 virus infection   [facility] [place], Megan P, DO   1 month ago Appointment  canceled by hospital   [facility], Megan P, DO   3 months ago Type 2 diabetes mellitus with diabetic neuropathy, without long-term current use of insulin [facility])   [place], [place], DO   5 months ago Lower leg edema   [facility] Roosvelt Maser [place], [place]   6 months ago Essential hypertension   [facility] Roosvelt Maser [place], [place]

## 2020-09-13 NOTE — Telephone Encounter (Signed)
Prescriptions called in verbally.  Called and let patient know that this was done for her.

## 2020-09-13 NOTE — Telephone Encounter (Signed)
Requested medication (s) are due for refill today: yes  Requested medication (s) are on the active medication list: yes  Last refill:  07/21/2020  Future visit scheduled: no  Notes to clinic: this refill cannot be delegated    Requested Prescriptions  Pending Prescriptions Disp Refills   cyclobenzaprine (FLEXERIL) 5 MG tablet [Pharmacy Med Name: CYCLOBENZAPRINE 5 MG TABLET] 90 tablet 0    Sig: TAKE 1 TABLET BY MOUTH THREE TIMES A DAY AS NEEDED FOR MUSCLE SPASMS      Not Delegated - Analgesics:  Muscle Relaxants Failed - 09/13/2020 11:11 AM      Failed - This refill cannot be delegated      Passed - Valid encounter within last 6 months    Recent Outpatient Visits           Yesterday Suspected COVID-19 virus infection   [facility] [place], Megan P, DO   1 month ago Appointment canceled by hospital   [facility], Megan P, DO   3 months ago Type 2 diabetes mellitus with diabetic neuropathy, without long-term current use of insulin [facility])   [facility] [place], [place], DO   5 months ago Lower leg edema   [facility] [name], [place]   6 months ago Essential hypertension   [facility] Roosvelt Maser [place], [place]

## 2020-09-13 NOTE — Telephone Encounter (Signed)
Copied from CRM [id number]. Topic: General - Other >> Sep 13, 2020 10:09 AM Jaquita Rector A wrote: Reason for CRM: Patient called in to inform Dr Laural Benes that the two Rx sent to her Pharmacy on 09/12/20 have not been received. Per snap shot shows both Rx ( E-Prescribing Status: Transmission to pharmacy in progress (09/12/2020  1:20 PM EST) Please advise

## 2020-09-13 NOTE — Telephone Encounter (Signed)
I stopped her Flexeril and sent in Tizanidine which is the muscle relaxer recommended for her age group, Flexeril is not recommended in patients over 5.  Please make her aware of this change and that Tizanidine works just as well:)

## 2020-09-21 ENCOUNTER — Other Ambulatory Visit: Payer: Self-pay | Admitting: Nurse Practitioner

## 2020-09-21 NOTE — Telephone Encounter (Signed)
Requested medication (s) are due for refill today: no  Requested medication (s) are on the active medication list: yes  Last refill:  09/13/20  Future visit scheduled: no  Notes to clinic:  last refill was prescribed #60 2 RF   Requested Prescriptions  Pending Prescriptions Disp Refills   tiZANidine (ZANAFLEX) 4 MG tablet [Pharmacy Med Name: TIZANIDINE HCL 4 MG TABLET] 60 tablet 2    Sig: TAKE 1 TABLET BY MOUTH EVERY 6 HOURS AS NEEDED FOR MUSCLE SPASMS.      Not Delegated - Cardiovascular:  Alpha-2 Agonists - tizanidine Failed - 09/21/2020  2:30 PM      Failed - This refill cannot be delegated      Passed - Valid encounter within last 6 months    Recent Outpatient Visits           1 week ago Suspected COVID-19 virus infection   [facility] [place], Megan P, DO   1 month ago Appointment canceled by hospital   [facility], Megan P, DO   3 months ago Type 2 diabetes mellitus with diabetic neuropathy, without long-term current use of insulin [facility])   [facility] [place], [place], DO   5 months ago Lower leg edema   [facility] Roosvelt Maser [place], [place]   6 months ago Essential hypertension   [facility] Roosvelt Maser [place], [place]

## 2020-09-25 ENCOUNTER — Other Ambulatory Visit: Payer: Self-pay | Admitting: Nurse Practitioner

## 2020-09-25 DIAGNOSIS — M25562 Pain in left knee: Secondary | ICD-10-CM | POA: Diagnosis not present

## 2020-09-25 DIAGNOSIS — G4486 Cervicogenic headache: Secondary | ICD-10-CM | POA: Diagnosis not present

## 2020-09-25 DIAGNOSIS — R499 Unspecified voice and resonance disorder: Secondary | ICD-10-CM | POA: Diagnosis not present

## 2020-09-25 DIAGNOSIS — M25561 Pain in right knee: Secondary | ICD-10-CM | POA: Diagnosis not present

## 2020-09-25 DIAGNOSIS — R251 Tremor, unspecified: Secondary | ICD-10-CM | POA: Diagnosis not present

## 2020-09-25 DIAGNOSIS — G20A1 Parkinson's disease without dyskinesia, without mention of fluctuations: Secondary | ICD-10-CM | POA: Insufficient documentation

## 2020-09-25 DIAGNOSIS — R262 Difficulty in walking, not elsewhere classified: Secondary | ICD-10-CM | POA: Diagnosis not present

## 2020-09-25 DIAGNOSIS — G20 Parkinson's disease: Secondary | ICD-10-CM | POA: Diagnosis not present

## 2020-09-25 DIAGNOSIS — F419 Anxiety disorder, unspecified: Secondary | ICD-10-CM | POA: Diagnosis not present

## 2020-09-25 DIAGNOSIS — M5481 Occipital neuralgia: Secondary | ICD-10-CM | POA: Diagnosis not present

## 2020-09-26 ENCOUNTER — Other Ambulatory Visit: Payer: Medicare HMO | Admitting: Nurse Practitioner

## 2020-10-03 ENCOUNTER — Telehealth: Payer: Self-pay | Admitting: Nurse Practitioner

## 2020-10-03 ENCOUNTER — Other Ambulatory Visit: Payer: Self-pay

## 2020-10-03 ENCOUNTER — Other Ambulatory Visit: Payer: Medicare HMO | Admitting: Nurse Practitioner

## 2020-10-03 NOTE — Telephone Encounter (Signed)
I attempted to call Cynthia Jones multiple times to confirm PC f/u, no answer, unable to leave a message

## 2020-10-03 NOTE — Telephone Encounter (Signed)
Opened in error

## 2020-10-06 ENCOUNTER — Other Ambulatory Visit: Payer: Self-pay | Admitting: Nurse Practitioner

## 2020-10-06 NOTE — Telephone Encounter (Signed)
Requested medication (s) are due for refill today: Yes  Requested medication (s) are on the active medication list: Yes  Last refill:  08/12/20  Future visit scheduled: Yes  Notes to clinic:  See request.    Requested Prescriptions  Pending Prescriptions Disp Refills   Vitamin D, Ergocalciferol, (DRISDOL) 1.25 MG (50000 UNIT) CAPS capsule [Pharmacy Med Name: VITAMIN D2 1.25MG (50,000 UNIT)] 3 capsule 0    Sig: Take 1 capsule (50,000 Units total) by mouth every 7 (seven) days.      Endocrinology:  Vitamins - Vitamin D Supplementation Failed - 10/06/2020  1:04 PM      Failed - 50,000 IU strengths are not delegated      Failed - Phosphate in normal range and within 360 days    No results found for: PHOS        Failed - Vitamin D in normal range and within 360 days    25-Hydroxy, Vitamin D-3  Date Value Ref Range Status  08/08/2018 20 ng/mL Final   25-Hydroxy, Vitamin D-2  Date Value Ref Range Status  08/08/2018 <1.0 ng/mL Final   25-Hydroxy, Vitamin D  Date Value Ref Range Status  08/08/2018 20 (L) ng/mL Final    Comment:    Reference Range: All Ages: Target levels 30 - 100           Passed - Ca in normal range and within 360 days    Calcium  Date Value Ref Range Status  03/01/2020 9.4 8.7 - 10.3 mg/dL Final          Passed - Valid encounter within last 12 months    Recent Outpatient Visits           3 weeks ago Suspected COVID-19 virus infection   [facility] [place], Megan P, DO   2 months ago Appointment canceled by hospital   [facility], Megan P, DO   3 months ago Type 2 diabetes mellitus with diabetic neuropathy, without long-term current use of insulin [facility])   [facility] [place], [place], DO   6 months ago Lower leg edema   [facility] Roosvelt Maser [place], [place]   7 months ago Essential hypertension   [facility] [place], Salley Hews, [place]       Future Appointments              In 2 months Larae Grooms, NP [facility], [facility]

## 2020-10-07 NOTE — Telephone Encounter (Signed)
I do not see a lab report on this

## 2020-10-07 NOTE — Telephone Encounter (Signed)
Patient is overdue for follow up.  We can recheck this at her next visit.

## 2020-10-07 NOTE — Telephone Encounter (Signed)
Pt scheduled for 10/31/2020 pt verbalized understanding,

## 2020-10-21 ENCOUNTER — Other Ambulatory Visit: Payer: Self-pay | Admitting: Family Medicine

## 2020-10-21 ENCOUNTER — Other Ambulatory Visit: Payer: Self-pay | Admitting: Nurse Practitioner

## 2020-10-21 DIAGNOSIS — I10 Essential (primary) hypertension: Secondary | ICD-10-CM

## 2020-10-21 NOTE — Telephone Encounter (Signed)
Requested medication (s) are due for refill today -yes  Requested medication (s) are on the active medication list -yes  Future visit scheduled -yes  Last refill: 08/02/20  Notes to clinic: Request Rf non delegated Rx  Requested Prescriptions  Pending Prescriptions Disp Refills   ARIPiprazole (ABILIFY) 5 MG tablet [Pharmacy Med Name: ARIPIPRAZOLE 5 MG TABLET] 90 tablet 1    Sig: TAKE 1 TABLET BY MOUTH EVERY DAY      Not Delegated - Psychiatry:  Antipsychotics - Second Generation (Atypical) - aripiprazole Failed - 10/21/2020 11:10 AM      Failed - This refill cannot be delegated      Passed - Valid encounter within last 6 months    Recent Outpatient Visits           1 month ago Suspected COVID-19 virus infection   [facility] [place], Megan P, DO   2 months ago Appointment canceled by hospital   [facility], Megan P, DO   4 months ago Type 2 diabetes mellitus with diabetic neuropathy, without long-term current use of insulin (HCC)   [facility] [place], [place], DO   6 months ago Lower leg edema   [facility] [name], [place]   7 months ago Essential hypertension   [facility] [place], Salley Hews, [place]       Future Appointments             In 1 week Larae Grooms, NP [facility], [facility]                 Requested Prescriptions  Pending Prescriptions Disp Refills   ARIPiprazole (ABILIFY) 5 MG tablet [Pharmacy Med Name: ARIPIPRAZOLE 5 MG TABLET] 90 tablet 1    Sig: TAKE 1 TABLET BY MOUTH EVERY DAY      Not Delegated - Psychiatry:  Antipsychotics - Second Generation (Atypical) - aripiprazole Failed - 10/21/2020 11:10 AM      Failed - This refill cannot be delegated      Passed - Valid encounter within last 6 months    Recent Outpatient Visits           1 month ago Suspected COVID-19 virus infection   [facility] [place], Megan P, DO   2 months ago  Appointment canceled by hospital   [facility], Megan P, DO   4 months ago Type 2 diabetes mellitus with diabetic neuropathy, without long-term current use of insulin [facility])   [facility] [place], [place], DO   6 months ago Lower leg edema   [facility] [name], [place]   7 months ago Essential hypertension   [facility] [place], Salley [place], [place]       Future Appointments             In 1 week Larae Grooms, NP [facility], [facility]

## 2020-10-21 NOTE — Telephone Encounter (Signed)
Overdue for follow up. Will need to keep appointment in March to get 90 day supply.

## 2020-10-21 NOTE — Telephone Encounter (Signed)
Requested medication (s) are due for refill today - yes- if continued therapy   Requested medication (s) are on the active medication list -yes  Future visit scheduled -yes  Last refill: 10/07/20  Notes to clinic: Request non delegated Rx RF- fails lab protocol- 2019  Requested Prescriptions  Pending Prescriptions Disp Refills   Vitamin D, Ergocalciferol, (DRISDOL) 1.25 MG (50000 UNIT) CAPS capsule [Pharmacy Med Name: VITAMIN D2 1.25MG (50,000 UNIT)] 3 capsule 0    Sig: TAKE 1 CAPSULE (50,000 UNITS TOTAL) BY MOUTH EVERY 7 (SEVEN) DAYS      Endocrinology:  Vitamins - Vitamin D Supplementation Failed - 10/21/2020 11:10 AM      Failed - 50,000 IU strengths are not delegated      Failed - Phosphate in normal range and within 360 days    No results found for: PHOS        Failed - Vitamin D in normal range and within 360 days    25-Hydroxy, Vitamin D-3  Date Value Ref Range Status  08/08/2018 20 ng/mL Final   25-Hydroxy, Vitamin D-2  Date Value Ref Range Status  08/08/2018 <1.0 ng/mL Final   25-Hydroxy, Vitamin D  Date Value Ref Range Status  08/08/2018 20 (L) ng/mL Final    Comment:    Reference Range: All Ages: Target levels 30 - 100           Passed - Ca in normal range and within 360 days    Calcium  Date Value Ref Range Status  03/01/2020 9.4 8.7 - 10.3 mg/dL Final          Passed - Valid encounter within last 12 months    Recent Outpatient Visits           1 month ago Suspected COVID-19 virus infection   [facility] [place], Megan P, DO   2 months ago Appointment canceled by hospital   [facility], Megan P, DO   4 months ago Type 2 diabetes mellitus with diabetic neuropathy, without long-term current use of insulin (HCC)   [facility] [place], [place], DO   6 months ago Lower leg edema   [facility] Roosvelt Maser [place], [place]   7 months ago Essential hypertension   [facility] [place],  Salley Hews, [place]       Future Appointments             In 1 week Larae Grooms, NP [facility], [facility]                 Requested Prescriptions  Pending Prescriptions Disp Refills   Vitamin D, Ergocalciferol, (DRISDOL) 1.25 MG (50000 UNIT) CAPS capsule [Pharmacy Med Name: VITAMIN D2 1.25MG (50,000 UNIT)] 3 capsule 0    Sig: TAKE 1 CAPSULE (50,000 UNITS TOTAL) BY MOUTH EVERY 7 (SEVEN) DAYS      Endocrinology:  Vitamins - Vitamin D Supplementation Failed - 10/21/2020 11:10 AM      Failed - 50,000 IU strengths are not delegated      Failed - Phosphate in normal range and within 360 days    No results found for: PHOS        Failed - Vitamin D in normal range and within 360 days    25-Hydroxy, Vitamin D-3  Date Value Ref Range Status  08/08/2018 20 ng/mL Final   25-Hydroxy, Vitamin D-2  Date Value Ref Range Status  08/08/2018 <1.0 ng/mL Final   25-Hydroxy, Vitamin D  Date Value  Ref Range Status  08/08/2018 20 (L) ng/mL Final    Comment:    Reference Range: All Ages: Target levels 30 - 100           Passed - Ca in normal range and within 360 days    Calcium  Date Value Ref Range Status  03/01/2020 9.4 8.7 - 10.3 mg/dL Final          Passed - Valid encounter within last 12 months    Recent Outpatient Visits           1 month ago Suspected COVID-19 virus infection   [facility] [place], Megan P, DO   2 months ago Appointment canceled by hospital   [facility], [facility] DO   4 months ago Type 2 diabetes mellitus with diabetic neuropathy, without long-term current use of insulin [facility])   [facility] [place], [place], DO   6 months ago Lower leg edema   [facility] Roosvelt Maser [place], [place]   7 months ago Essential hypertension   [facility] [place], Salley Hews, [place]       Future Appointments             In 1 week Larae Grooms, NP [facility], [facility]

## 2020-10-22 ENCOUNTER — Encounter: Payer: Self-pay | Admitting: Nurse Practitioner

## 2020-10-28 ENCOUNTER — Telehealth: Payer: Self-pay | Admitting: General Practice

## 2020-10-28 NOTE — Telephone Encounter (Cosign Needed)
  Chronic Care Management   Outreach Note  10/28/2020 Name: Cynthia Jones MRN: [id number] DOB: January 30, 1941  Referred by: Larae Grooms, NP Reason for referral : Advice Only (RNCM: The patients daughter called and left a Voicemail asking for a call back from [facility] because the patient needed an adjustment in medications. )   The patients daughter, Cynthia Jones called and left a voicemail on [facility] voicemail asking for a call back as the patient needed an adjustment to her medications. Secure text message sent back to the daughter asking the daughter to call the office for an appointment as this was not something the [facility] could assist with. Secure message sent to pcp concerning the request by the daughter. The patient already has an upcoming appointment to see the pcp on 10-31-2020.   Follow Up Plan: Telephone follow up appointment with care management team member scheduled for: 11-05-2020  Alto Denver RN, MSN, CCM Community Care Coordinator [place]  [facility] Mobile: [phone]

## 2020-10-31 ENCOUNTER — Other Ambulatory Visit: Payer: Self-pay

## 2020-10-31 ENCOUNTER — Ambulatory Visit (INDEPENDENT_AMBULATORY_CARE_PROVIDER_SITE_OTHER): Payer: Medicare HMO | Admitting: Internal Medicine

## 2020-10-31 ENCOUNTER — Encounter: Payer: Self-pay | Admitting: Internal Medicine

## 2020-10-31 VITALS — BP 89/57 | HR 73 | Temp 99.8°F | Wt 245.0 lb

## 2020-10-31 DIAGNOSIS — M1990 Unspecified osteoarthritis, unspecified site: Secondary | ICD-10-CM | POA: Diagnosis not present

## 2020-10-31 DIAGNOSIS — E1140 Type 2 diabetes mellitus with diabetic neuropathy, unspecified: Secondary | ICD-10-CM | POA: Diagnosis not present

## 2020-10-31 MED ORDER — LIDOCAINE 5 % EX PTCH: 1.0000 | MEDICATED_PATCH | CUTANEOUS | 2 refills | Status: DC

## 2020-10-31 MED ORDER — PREGABALIN 25 MG PO CAPS
25.0000 mg | ORAL_CAPSULE | Freq: Every day | ORAL | 1 refills | Status: DC
Start: 2020-10-31 — End: 2020-11-27

## 2020-10-31 NOTE — Progress Notes (Signed)
BP (!) 89/57   Pulse 73   Temp 99.8 F (37.7 C) (Oral)   Wt 245 lb (111.1 kg)   SpO2 97%   BMI 47.85 kg/m    Subjective:    Patient ID: Cynthia Jones, female    DOB: Jun 13, 1941, 80 y.o.   MRN: [id number]  HPI: Cynthia Jones is a 80 y.o. female  Pt is here for a follow up - pt now lives with daughter since dec 2021 - used to live with her son in the past. Pt has a ho fibromyalgia, cannot go to the bathroom - cannot get up or down cannot sit at the table.  Goes to her lift chair and that's about it per pts daughter palliative care for parkinson's sees neurology for such.  Depression - on abilify 2 mg and then up to 5 mg hasn't had this since dec.  fibromyalgia - off of lyrica - pt stopped sec to weight gain - hasn't been movign. Had PT / OT Deboraha Sprang Counsellor.   Depression        This is a chronic (crying episodes + per daughter doesnt want to be her anymore hasnt been on abilify per faghter since dec. ) problem.  The current episode started more than 1 year ago.   Associated symptoms include fatigue, helplessness, hopelessness and sad.  Associated symptoms include does not have insomnia, not irritable, no restlessness, no appetite change, no body aches, no myalgias, no headaches, no indigestion and no suicidal ideas. Gastroesophageal Reflux She reports no abdominal pain, no chest pain, no choking, no coughing or no nausea. Associated symptoms include fatigue.    Chief Complaint  Patient presents with  . Diabetes    Relevant past medical, surgical, family and social history reviewed and updated as indicated. Interim medical history since our last visit reviewed. Allergies and medications reviewed and updated.  Review of Systems  Constitutional: Positive for fatigue. Negative for appetite change.  HENT: Negative for congestion, dental problem and drooling.   Eyes: Negative for discharge.  Respiratory: Negative for apnea, cough, choking, chest tightness and shortness of  breath.   Cardiovascular: Negative for chest pain and leg swelling.  Gastrointestinal: Negative for abdominal distention, abdominal pain, blood in stool, constipation, diarrhea and nausea.  Endocrine: Negative for cold intolerance, heat intolerance, polydipsia and polyphagia.  Genitourinary: Negative for difficulty urinating and dyspareunia.  Musculoskeletal: Positive for arthralgias and joint swelling. Negative for back pain, gait problem, myalgias and neck pain.  Skin: Negative for color change.  Neurological: Negative for dizziness, seizures, syncope, facial asymmetry, speech difficulty, light-headedness, numbness and headaches.  Psychiatric/Behavioral: Positive for depression. Negative for suicidal ideas. The patient does not have insomnia.     Per HPI unless specifically indicated above     Objective:    BP (!) 89/57   Pulse 73   Temp 99.8 F (37.7 C) (Oral)   Wt 245 lb (111.1 kg)   SpO2 97%   BMI 47.85 kg/m   Wt Readings from Last 3 Encounters:  10/31/20 245 lb (111.1 kg)  09/12/20 245 lb (111.1 kg)  06/25/20 259 lb (117.5 kg)    Physical Exam Vitals reviewed.  Constitutional:      General: She is not irritable.    Results for orders placed or performed in visit on 06/13/20  Bayer DCA Hb A1c Waived  Result Value Ref Range   HB A1C (BAYER DCA - WAIVED) 7.0 (H) <7.0 %      Assessment & Plan:  1. Depression :  Restart lyrica at low dose 25 mg  Is on cymbalta taper to off in 2 weeks.   2. GERD - take nexium as needed  Takes sucralfate   3. Dementia : sec to parkinsons.   4. Fibromyalgia - is on tizanidine uses prn.  Will continue lyrica   5. parkinsons is on sinemet. Sees neurology for such   6. Left knee arthrtiis  Will start pt on left knee check xray   7. Hypothyroidism ; continue 50 mcg of synthroid.   HYPOTHYROIDISM PLEASE TAKE YOUR THYROID MEDICATION FIRST THING IN THE MORNING WHILST FASTING.  NO MEDICATION/ FOOD FOR AN HOUR AFTER INGESTING  THYROID PILLS.   Problem List Items Addressed This Visit   None      Follow up plan: No follow-ups on file.  Health Maintenance :  Mammogram/ Paps smear:2021 DEXA: ?  Cscope :2013 Pneumonia vaccine :prevanar and pneumovax prevanar 2015 , 2009 pneumovax.  FLu vaccine :2020

## 2020-11-02 ENCOUNTER — Other Ambulatory Visit: Payer: Self-pay | Admitting: Nurse Practitioner

## 2020-11-02 NOTE — Telephone Encounter (Signed)
Med dc'd 10/31/20 by PCP [facility] NT not delegated to RF or refuse this medication. Routing to

## 2020-11-05 ENCOUNTER — Ambulatory Visit (INDEPENDENT_AMBULATORY_CARE_PROVIDER_SITE_OTHER): Payer: Medicare HMO | Admitting: General Practice

## 2020-11-05 ENCOUNTER — Telehealth: Payer: Self-pay | Admitting: General Practice

## 2020-11-05 DIAGNOSIS — E1140 Type 2 diabetes mellitus with diabetic neuropathy, unspecified: Secondary | ICD-10-CM

## 2020-11-05 DIAGNOSIS — F419 Anxiety disorder, unspecified: Secondary | ICD-10-CM

## 2020-11-05 DIAGNOSIS — M797 Fibromyalgia: Secondary | ICD-10-CM

## 2020-11-05 DIAGNOSIS — I10 Essential (primary) hypertension: Secondary | ICD-10-CM

## 2020-11-05 DIAGNOSIS — G894 Chronic pain syndrome: Secondary | ICD-10-CM

## 2020-11-05 DIAGNOSIS — G8929 Other chronic pain: Secondary | ICD-10-CM

## 2020-11-05 DIAGNOSIS — F3342 Major depressive disorder, recurrent, in full remission: Secondary | ICD-10-CM

## 2020-11-05 NOTE — Chronic Care Management (AMB) (Signed)
Chronic Care Management   CCM RN Visit Note  11/05/2020 Name: Cynthia Jones MRN: [id number] DOB: 1941-01-23  Subjective: Cynthia Jones is a 80 y.o. year old female who is a primary care patient of Vigg, Avanti, MD. The care management team was consulted for assistance with disease management and care coordination needs.    Engaged with patient by telephone for follow up visit in response to provider referral for case management and/or care coordination services.   Consent to Services:  The patient was given information about Chronic Care Management services, agreed to services, and gave verbal consent prior to initiation of services.  Please see initial visit note for detailed documentation.   Patient agreed to services and verbal consent obtained.   Assessment: Review of patient past medical history, allergies, medications, health status, including review of consultants reports, laboratory and other test data, was performed as part of comprehensive evaluation and provision of chronic care management services.   SDOH (Social Determinants of Health) assessments and interventions performed:    CCM Care Plan  Allergies  Allergen Reactions  . Nsaids Other (See Comments)    GI upset, hx of H pylori and gastric ulcer  . Erythromycin Nausea Only    Outpatient Encounter Medications as of 11/05/2020  Medication Sig  . b complex vitamins tablet Take 1 tablet by mouth daily.  . benzonatate (TESSALON) 200 MG capsule Take 1 capsule (200 mg total) by mouth 2 (two) times daily as needed for cough.  . carbidopa-levodopa (SINEMET IR) 25-100 MG tablet Take 1 tablet by mouth in the morning, at noon, and at bedtime.  . carvedilol (COREG) 6.25 MG tablet TAKE 1 TABLET BY MOUTH TWICE A DAY WITH A MEAL  . diclofenac Sodium (VOLTAREN) 1 % GEL Apply 2 g topically as needed.   [name] esomeprazole (NEXIUM) 40 MG capsule Take 40 mg by mouth as needed.   [name] levothyroxine (SYNTHROID) 50 MCG tablet TAKE 1  TABLET BY MOUTH EVERY DAY  . lidocaine (LIDODERM) 5 % Place 1 patch onto the skin daily. Remove & Discard patch within 12 hours or as directed by MD  . losartan (COZAAR) 100 MG tablet Take 1 tablet (100 mg total) by mouth daily.  [name] nystatin (MYCOSTATIN/NYSTOP) powder APPLY TO AFFECTED AREA 3 TIMES A DAY  . pregabalin (LYRICA) 25 MG capsule Take 1 capsule (25 mg total) by mouth daily.  . sucralfate (CARAFATE) 1 g tablet Take 1 tablet (1 g total) by mouth 4 (four) times daily -  with meals and at bedtime.  . traMADol (ULTRAM) 50 MG tablet Take 1 tablet (50 mg total) by mouth 3 (three) times daily as needed for severe pain. Must last 30 days  . UNABLE TO FIND Take by mouth as needed. [place] HBP  . UNABLE TO FIND Take by mouth daily. CBD gummies  . Vitamin D, Ergocalciferol, (DRISDOL) 1.25 MG (50000 UNIT) CAPS capsule TAKE 1 CAPSULE (50,000 UNITS TOTAL) BY MOUTH EVERY 7 (SEVEN) DAYS   No facility-administered encounter medications on file as of 11/05/2020.    Patient Active Problem List   Diagnosis Date Noted  . Parkinson disease ([place]) 09/25/2020  . DDD (degenerative disc disease), lumbosacral 07/10/2020  . Chronic lower extremity pain (Bilateral) 07/10/2020  . Other intervertebral disc degeneration, lumbar region 07/10/2020  . Change in voice 07/02/2020  . Difficulty walking 07/02/2020  . Tremor 07/02/2020  . Thyroiditis 12/19/2019  . Chronic knee pain (Left) 07/05/2019  . Neurogenic pain 11/01/2018  . Morbid obesity  with BMI of 40.0-44.9, adult ([place]) 09/20/2018  . Hypothyroid 09/15/2018  . Vitamin D deficiency 09/05/2018  . Elevated C-reactive protein (CRP) 08/09/2018  . Elevated sed rate 08/09/2018  . Pharmacologic therapy 08/08/2018  . Disorder of skeletal system 08/08/2018  . Problems influencing health status 08/08/2018  . Contusion of foot 03/09/2018  . Advanced care planning/counseling discussion 04/12/2017  . Lumbar spondylosis 10/07/2016  . Hypercholesteremia 09/14/2016   . Long term prescription opiate use 09/08/2016  . Long term prescription benzodiazepine use 09/08/2016  . Chronic pain syndrome 09/08/2016  . Chronic low back pain (2ry area of Pain) (Bilateral) (L>R) 09/08/2016  . Chronic knee pain (1ry area of Pain) (Bilateral) (L>R) 09/08/2016  . Cervicogenic headache 09/08/2016  . Chronic hip pain (Bilateral) 09/08/2016  . Chronic hand pain (Bilateral) (R>L) 09/08/2016  . Chronic foot pain (Left) 09/08/2016  . Insomnia 09/17/2015  . Folliculitis 76/72/0947  . History of total knee replacement (Right) 04/17/2015  . Fibromyalgia 04/09/2015  . Bilateral occipital neuralgia 04/09/2015  . Greater trochanteric bursitis 04/09/2015  . Osteoarthritis of knee (Left) 04/09/2015  . Sacroiliac joint disease 04/09/2015  . Lumbar facet syndrome (Bilateral) (L>R) 04/09/2015  . Neuropathy due to secondary diabetes ([place]) 04/09/2015  . Osteopenia 03/06/2015  . Anxiety 03/06/2015  . Depression 03/06/2015  . Hypertensive disorder 03/06/2015  . Diabetes mellitus ([place]) 03/06/2015  . Osteoarthritis 03/06/2015  . Seasonal allergic rhinitis 03/06/2015  . Obesity 03/06/2015  . Intertrigo 03/06/2015    Conditions to be addressed/monitored:HTN, DMII, Anxiety, Depression and chronic pain  Care Plan : RNCM: Depression (Adult)  Updates made by Vanita Ingles since 11/05/2020 12:00 AM    Problem: RNCM Anxiety and Depression Identification (Depression)   Priority: Medium    Goal: RNCM: Anxiety and Depressive Symptoms Identified   Priority: Medium  Note:   Current Barriers:  [name] Knowledge Deficits related to management of management of anxiety and depression . Chronic Disease Management support and education needs related to unresolved anxiety and depression . Lacks caregiver support.  . Transportation barriers . Non-adherence to scheduled provider appointments . Unable to independently manage depression and anxiety- loss of husband in death, has had a lot of change in  the last couple of years. Back at the home she shared with her husband and this is helping a lot.  . Does not attend all scheduled provider appointments . Does not contact provider office for questions/concerns  Nurse Case Manager Clinical Goal(s):  [name] Over the next 120 days, patient will verbalize understanding of plan for management of depression and anxiety . Over the next 120 days, patient will attend all scheduled medical appointments: 11-21-2020 at 240 pm . Over the next 120 days, patient will demonstrate improved adherence to prescribed treatment plan for anxiety and depression as evidenced byexpressing feelings to the CCM team, calling the office for changes in mood/anxiety/depression, following recommendations to pcp and utilization of support systems   Interventions:  . 1:1 collaboration with Charlynne Cousins, MD regarding development and update of comprehensive plan of care as evidenced by provider attestation and co-signature . Inter-disciplinary care team collaboration (see longitudinal plan of care) . Evaluation of current treatment plan related to anxiety and depression  and patient's adherence to plan as established by provider. 11-05-2020: The patient states she is very happy with her new pcp. She is happy today because it is her birthday and her family is with her. . Advised patient to call the office for changes in mood/anxiety level/ or increased depressive state .  Provided education to patient re: support for anxiety and depression and alternative methods such as mindfulness and relaxation to help with periods of anxiety and depression . Discussed plans with patient for ongoing care management follow up and provided patient with direct contact information for care management team . Reviewed scheduled/upcoming provider appointments including: 11-21-2020 at 240 pm   Patient Goals/Self-Care Activities Over the next 120 days, patient will:  - Patient will self administer medications as  prescribed Patient will attend all scheduled provider appointments Patient will call pharmacy for medication refills Patient will attend church or other social activities Patient will call provider office for new concerns or questions Patient will work with BSW to address care coordination needs and will continue to work with the clinical team to address health care and disease management related needs.   - anxiety screen reviewed - depression screen reviewed - medication list reviewed  Follow Up Plan: Telephone follow up appointment with care management team member scheduled for: 01-08-2021 at 1:00 pm       Care Plan : RNCM: Chronic Pain (Adult)  Updates made by Vanita Ingles since 11/05/2020 12:00 AM    Problem: RNCM: Pain Management Plan (Chronic Pain)   Priority: Medium    Goal: RNCM: Pain Management Plan Developed   Priority: Medium  Note:   Current Barriers:  [name] Knowledge Deficits related to managing acute/chronic pain . Non-adherence to scheduled provider appointments . Non-adherence to prescribed medication regimen . Difficulty obtaining medications . Chronic Disease Management support and education needs related to chronic pain . Unable to independently manage chronic pain and discomfort  . Does not attend all scheduled provider appointments . Does not contact provider office for questions/concerns  Nurse Case Manager Clinical Goal(s):  [name] Over the next 120 days, patient will verbalize understanding of plan for managing pain . Over the next 120 days, patient will attend all scheduled medical appointments: 11-21-2020 at 240 pm . Over the next 120   days patient will demonstrate use of different relaxation  skills and/or diversional activities to assist with pain reduction (distraction, imagery, relaxation, massage, acupressure, TENS, heat, and cold application . Over the next 120    days patient will report pain at a level less than 3 to 4 on a 10-10 rating scale . Over the  next  120  days patient will use pharmacological and nonpharmacological pain relief strategies . Over the next 120  days patient will verbalize acceptable level of pain relief and ability to engage in desired activities . Over the next  120 days patient will engage in desired activities without an increase in pain level  Interventions:  . - deep breathing, relaxation and mindfulness use promoted . - effectiveness of pharmacologic therapy monitored- 11-05-2020: Has had changes in her medications and the patient states they are working well for her. She denies any new concerns at this time. Could tell the RNCM her appointment to get blood work and a virtual visit with the pcp coming up.  . - misuse of pain medication assessed . - motivation and barriers to change assessed and addressed . - mutually acceptable comfort goal set . - pain assessed- 11-05-2020: Denies pain at this time . - pain treatment goals reviewed . Evaluation of current treatment plan related to Chronic pain and discomfort  and patient's adherence to plan as established by provider. . Advised patient to call the provider for worsening pain or discomfort . Provided education to patient re: alternative pain relief measure .  Reviewed medications with patient and discussed compliance.  The patient states that her pain is well controlled at this time. Is still going to pain management. 11-05-2020:State that she is doing well with the medication changes  . Collaborated with CCM team and pcp regarding management of pain and discomfort effectively . Discussed plans with patient for ongoing care management follow up and provided patient with direct contact information for care management team . Allow patient to maintain a diary of pain ratings, timing, precipitating events, medications, treatments, and what works best to relieve pain,  . Refer to support groups and self-help groups . Educate patient about the use of pharmacological interventions  for pain management- antianxiety, antidepressants, NSAIDS, opioid analgesics,  . Explain the importance of lifestyle modifications to effective pain management   Patient Goals/Self Care Activities:  . Patient verbalizes understanding of plan to manage chronic pain and discomfort  . Self-administers medications as prescribed . Attends all scheduled provider appointments . Calls pharmacy for medication refills . Calls provider office for new concerns or questions . - mutually acceptable comfort goal set . - pain assessed . - pain management plan developed . - pain treatment goals reviewed . - patient response to treatment assessed . - sharing of pain management plan with teachers and other caregivers encouraged  Follow Up Plan: Telephone follow up appointment with care management team member scheduled for: 01-08-2021 at 1:00 pm     Care Plan : RNCM: Hypertension (Adult)  Updates made by Marlowe Sax since 11/05/2020 12:00 AM    Problem: RNCM: Hypertension (Hypertension)   Priority: Medium    Long-Range Goal: RNCM: Hypertension Monitored   Priority: Medium  Note:   Objective:  . Last practice recorded BP readings:  . BP Readings from Last 3 Encounters: .  10/31/20 . (!) 00/36 .  06/25/20 . 647/04 .  06/13/20 . (!) 159/88 .    [name] Most recent eGFR/CrCl: No results found for: EGFR  No components found for: CRCL Current Barriers:  [name] Knowledge Deficits related to basic understanding of hypertension pathophysiology and self care management . Knowledge Deficits related to understanding of medications prescribed for management of hypertension . Non-adherence to scheduled provider appointments . Transportation barriers . Unable to independently manage HTN . Does not attend all scheduled provider appointments . Does not contact provider office for questions/concerns Case Manager Clinical Goal(s):  [name] Over the next 120 days, patient will verbalize understanding of plan for hypertension  management . Over the next 120 days, patient will attend all scheduled medical appointments: 11-21-2020 at 240 pm . Over the next 120 days, patient will demonstrate improved adherence to prescribed treatment plan for hypertension as evidenced by taking all medications as prescribed, monitoring and recording blood pressure as directed, adhering to low sodium/DASH diet . Over the next 120 days, patient will demonstrate improved health management independence as evidenced by checking blood pressure as directed and notifying PCP if SBP>160 or DBP > 90, taking all medications as prescribe, and adhering to a low sodium diet as discussed. . Over the next 120 days, patient will verbalize basic understanding of hypertension disease process and self health management plan as evidenced by compliance with recommendations by the provider Interventions:  . Evaluation of current treatment plan related to hypertension self management and patient's adherence to plan as established by provider. 11-05-2020: The patient states that she is not having issues with light headedness or dizziness, she does not have a way to check her blood pressure. Pressure  was low in the pcp office last week. Review of sx/sx of hypotension.  . Provided education to patient re: stroke prevention, s/s of heart attack and stroke, DASH diet, complications of uncontrolled blood pressure . Reviewed medications with patient and discussed importance of compliance. 11-05-2020: The patient endorses compliance with medications . Discussed plans with patient for ongoing care management follow up and provided patient with direct contact information for care management team . Advised patient, providing education and rationale, to monitor blood pressure daily and record, calling PCP for findings outside established parameters.  . Reviewed scheduled/upcoming provider appointments including: 01-08-2021 at 240 pm Patient Goals/Self-Care Activities . Over the next 120  days, patient will:  - Attends all scheduled provider appointments Calls provider office for new concerns, questions, or BP outside discussed parameters Checks BP and records as discussed Follows a low sodium diet/DASH diet - depression screen reviewed - home or ambulatory blood pressure monitoring encouraged Follow Up Plan: Telephone follow up appointment with care management team member scheduled for: 01-08-2021 at 1:00 pm    Care Plan : RNCM: Diabetes Type 2 (Adult)  Updates made by Vanita Ingles since 11/05/2020 12:00 AM    Problem: RNCM: Glycemic Management (Diabetes, Type 2)   Priority: Medium    Goal: RNCM: Glycemic Management Optimized   Priority: Medium  Note:   Objective:  . Lab Results .  Component . Value . Date .   [name] HGBA1C . 7.0 (H) . 06/13/2020 .    Lab Results  Component Value Date   CREATININE 0.89 03/01/2020   CREATININE 0.76 06/16/2019   CREATININE 0.74 03/09/2019 .   [name] No results found for: EGFR Current Barriers:  [name] Knowledge Deficits related to basic Diabetes pathophysiology and self care/management . Knowledge Deficits related to medications used for management of diabetes . Unable to independently manage DM . Does not attend all scheduled provider appointments Case Manager Clinical Goal(s):  [name] Over the next 120 days, patient will demonstrate improved adherence to prescribed treatment plan for diabetes self care/management as evidenced by:  . daily monitoring and recording of CBG  . adherence to ADA/ carb modified diet . adherence to prescribed medication regimen Interventions:  . Provided education to patient about basic DM disease process . Reviewed medications with patient and discussed importance of medication adherence . Discussed plans with patient for ongoing care management follow up and provided patient with direct contact information for care management team . Provided patient with written educational materials related to hypo and  hyperglycemia and importance of correct treatment. 11-05-2020: The patient denies any lows at this time. States her appetite is good. Has upcoming blood work.  . Reviewed scheduled/upcoming provider appointments including: 11-21-2020 at 240 pm . Advised patient, providing education and rationale, to check cbg BID and record, calling pcp for findings outside established parameters.   . Review of patient status, including review of consultants reports, relevant laboratory and other test results, and medications completed. Patient Goals/Self-Care Activities . Over the next 120 days, patient will:  - UNABLE to independently manage DM as evidence of hemoglobin A1C 7.0 Self administers oral medications as prescribed Attends all scheduled provider appointments Checks blood sugars as prescribed and utilize hyper and hypoglycemia protocol as needed Adheres to prescribed ADA/carb modified - barriers to adherence to treatment plan identified - blood glucose monitoring encouraged - individualized medical nutrition therapy provided - resources required to improve adherence to care identified - self-awareness of signs/symptoms of hypo or hyperglycemia encouraged - use  of blood glucose monitoring log promoted Follow Up Plan: Telephone follow up appointment with care management team member scheduled for: 01-08-2021 at 1:00 pm     Plan:Telephone follow up appointment with care management team member scheduled for:  01-08-2021 at 1 pm  Keddie, MSN, [facility] Mobile: [phone]

## 2020-11-05 NOTE — Patient Instructions (Signed)
Visit Information  PATIENT GOALS: Goals Addressed            This Visit's Progress   . RNCM: Monitor and Manage My Blood Sugar-Diabetes Type 2       Timeframe:  Long-Range Goal Priority:  Medium Start Date:                             Expected End Date:     11-05-2021                  Follow Up Date 01-08-2021 at 1 pm   - check blood sugar at prescribed times - check blood sugar before and after exercise - check blood sugar if I feel it is too high or too low - enter blood sugar readings and medication or insulin into daily log    Why is this important?    Checking your blood sugar at home helps to keep it from getting very high or very low.   Writing the results in a diary or log helps the doctor know how to care for you.   Your blood sugar log should have the time, date and the results.   Also, write down the amount of insulin or other medicine that you take.   Other information, like what you ate, exercise done and how you were feeling, will also be helpful.           Patient Care Plan: RNCM: Depression (Adult)    Problem Identified: RNCM Anxiety and Depression Identification (Depression)   Priority: Medium    Goal: RNCM: Anxiety and Depressive Symptoms Identified   Priority: Medium  Note:   Current Barriers:  [name] Knowledge Deficits related to management of management of anxiety and depression . Chronic Disease Management support and education needs related to unresolved anxiety and depression . Lacks caregiver support.  . Transportation barriers . Non-adherence to scheduled provider appointments . Unable to independently manage depression and anxiety- loss of husband in death, has had a lot of change in the last couple of years. Back at the home she shared with her husband and this is helping a lot.  . Does not attend all scheduled provider appointments . Does not contact provider office for questions/concerns  Nurse Case Manager Clinical Goal(s):  [name] Over the  next 120 days, patient will verbalize understanding of plan for management of depression and anxiety . Over the next 120 days, patient will attend all scheduled medical appointments: 11-21-2020 at 240 pm . Over the next 120 days, patient will demonstrate improved adherence to prescribed treatment plan for anxiety and depression as evidenced byexpressing feelings to the CCM team, calling the office for changes in mood/anxiety/depression, following recommendations to pcp and utilization of support systems   Interventions:  . 1:1 collaboration with Charlynne Cousins, MD regarding development and update of comprehensive plan of care as evidenced by provider attestation and co-signature . Inter-disciplinary care team collaboration (see longitudinal plan of care) . Evaluation of current treatment plan related to anxiety and depression  and patient's adherence to plan as established by provider. 11-05-2020: The patient states she is very happy with her new pcp. She is happy today because it is her birthday and her family is with her. . Advised patient to call the office for changes in mood/anxiety level/ or increased depressive state . Provided education to patient re: support for anxiety and depression and alternative methods such as mindfulness and relaxation  to help with periods of anxiety and depression . Discussed plans with patient for ongoing care management follow up and provided patient with direct contact information for care management team . Reviewed scheduled/upcoming provider appointments including: 11-21-2020 at 240 pm   Patient Goals/Self-Care Activities Over the next 120 days, patient will:  - Patient will self administer medications as prescribed Patient will attend all scheduled provider appointments Patient will call pharmacy for medication refills Patient will attend church or other social activities Patient will call provider office for new concerns or questions Patient will work with BSW  to address care coordination needs and will continue to work with the clinical team to address health care and disease management related needs.   - anxiety screen reviewed - depression screen reviewed - medication list reviewed  Follow Up Plan: Telephone follow up appointment with care management team member scheduled for: 01-08-2021 at 1:00 pm       Task: RNCM: Identify Depressive Symptoms and Facilitate Treatment   Note:   Care Management Activities:    - anxiety screen reviewed - depression screen reviewed - medication list reviewed       Patient Care Plan: RNCM: Chronic Pain (Adult)    Problem Identified: RNCM: Pain Management Plan (Chronic Pain)   Priority: Medium    Goal: RNCM: Pain Management Plan Developed   Priority: Medium  Note:   Current Barriers:  [name] Knowledge Deficits related to managing acute/chronic pain . Non-adherence to scheduled provider appointments . Non-adherence to prescribed medication regimen . Difficulty obtaining medications . Chronic Disease Management support and education needs related to chronic pain . Unable to independently manage chronic pain and discomfort  . Does not attend all scheduled provider appointments . Does not contact provider office for questions/concerns  Nurse Case Manager Clinical Goal(s):  [name] Over the next 120 days, patient will verbalize understanding of plan for managing pain . Over the next 120 days, patient will attend all scheduled medical appointments: 11-21-2020 at 240 pm . Over the next 120   days patient will demonstrate use of different relaxation  skills and/or diversional activities to assist with pain reduction (distraction, imagery, relaxation, massage, acupressure, TENS, heat, and cold application . Over the next 120    days patient will report pain at a level less than 3 to 4 on a 10-10 rating scale . Over the next  120  days patient will use pharmacological and nonpharmacological pain relief  strategies . Over the next 120  days patient will verbalize acceptable level of pain relief and ability to engage in desired activities . Over the next  120 days patient will engage in desired activities without an increase in pain level  Interventions:  . - deep breathing, relaxation and mindfulness use promoted . - effectiveness of pharmacologic therapy monitored- 11-05-2020: Has had changes in her medications and the patient states they are working well for her. She denies any new concerns at this time. Could tell the RNCM her appointment to get blood work and a virtual visit with the pcp coming up.  . - misuse of pain medication assessed . - motivation and barriers to change assessed and addressed . - mutually acceptable comfort goal set . - pain assessed- 11-05-2020: Denies pain at this time . - pain treatment goals reviewed . Evaluation of current treatment plan related to Chronic pain and discomfort  and patient's adherence to plan as established by provider. . Advised patient to call the provider for worsening pain or discomfort .  Provided education to patient re: alternative pain relief measure . Reviewed medications with patient and discussed compliance.  The patient states that her pain is well controlled at this time. Is still going to pain management. 11-05-2020:State that she is doing well with the medication changes  . Collaborated with CCM team and pcp regarding management of pain and discomfort effectively . Discussed plans with patient for ongoing care management follow up and provided patient with direct contact information for care management team . Allow patient to maintain a diary of pain ratings, timing, precipitating events, medications, treatments, and what works best to relieve pain,  . Refer to support groups and self-help groups . Educate patient about the use of pharmacological interventions for pain management- antianxiety, antidepressants, NSAIDS, opioid analgesics,   . Explain the importance of lifestyle modifications to effective pain management   Patient Goals/Self Care Activities:  . Patient verbalizes understanding of plan to manage chronic pain and discomfort  . Self-administers medications as prescribed . Attends all scheduled provider appointments . Calls pharmacy for medication refills . Calls provider office for new concerns or questions . - mutually acceptable comfort goal set . - pain assessed . - pain management plan developed . - pain treatment goals reviewed . - patient response to treatment assessed . - sharing of pain management plan with teachers and other caregivers encouraged  Follow Up Plan: Telephone follow up appointment with care management team member scheduled for: 01-08-2021 at 1:00 pm     Task: RNCM: Partner to Develop Chronic Pain Management Plan   Note:   Care Management Activities:    - mutually acceptable comfort goal set - pain assessed - pain management plan developed - pain treatment goals reviewed - patient response to treatment assessed - sharing of pain management plan with teachers and other caregivers encouraged       Patient Care Plan: RNCM: Hypertension (Adult)    Problem Identified: RNCM: Hypertension (Hypertension)   Priority: Medium    Long-Range Goal: RNCM: Hypertension Monitored   Priority: Medium  Note:   Objective:  . Last practice recorded BP readings:  . BP Readings from Last 3 Encounters: .  10/31/20 . (!) 48/18 .  06/25/20 . 563/14 .  06/13/20 . (!) 159/88 .    [name] Most recent eGFR/CrCl: No results found for: EGFR  No components found for: CRCL Current Barriers:  [name] Knowledge Deficits related to basic understanding of hypertension pathophysiology and self care management . Knowledge Deficits related to understanding of medications prescribed for management of hypertension . Non-adherence to scheduled provider appointments . Transportation barriers . Unable to independently  manage HTN . Does not attend all scheduled provider appointments . Does not contact provider office for questions/concerns Case Manager Clinical Goal(s):  [name] Over the next 120 days, patient will verbalize understanding of plan for hypertension management . Over the next 120 days, patient will attend all scheduled medical appointments: 11-21-2020 at 240 pm . Over the next 120 days, patient will demonstrate improved adherence to prescribed treatment plan for hypertension as evidenced by taking all medications as prescribed, monitoring and recording blood pressure as directed, adhering to low sodium/DASH diet . Over the next 120 days, patient will demonstrate improved health management independence as evidenced by checking blood pressure as directed and notifying PCP if SBP>160 or DBP > 90, taking all medications as prescribe, and adhering to a low sodium diet as discussed. . Over the next 120 days, patient will verbalize basic understanding of hypertension disease process  and self health management plan as evidenced by compliance with recommendations by the provider Interventions:  . Evaluation of current treatment plan related to hypertension self management and patient's adherence to plan as established by provider. 11-05-2020: The patient states that she is not having issues with light headedness or dizziness, she does not have a way to check her blood pressure. Pressure was low in the pcp office last week. Review of sx/sx of hypotension.  . Provided education to patient re: stroke prevention, s/s of heart attack and stroke, DASH diet, complications of uncontrolled blood pressure . Reviewed medications with patient and discussed importance of compliance. 11-05-2020: The patient endorses compliance with medications . Discussed plans with patient for ongoing care management follow up and provided patient with direct contact information for care management team . Advised patient, providing education and  rationale, to monitor blood pressure daily and record, calling PCP for findings outside established parameters.  . Reviewed scheduled/upcoming provider appointments including: 01-08-2021 at 240 pm Patient Goals/Self-Care Activities . Over the next 120 days, patient will:  - Attends all scheduled provider appointments Calls provider office for new concerns, questions, or BP outside discussed parameters Checks BP and records as discussed Follows a low sodium diet/DASH diet - depression screen reviewed - home or ambulatory blood pressure monitoring encouraged Follow Up Plan: Telephone follow up appointment with care management team member scheduled for: 01-08-2021 at 1:00 pm    Task: RNCM: Identify and Monitor Blood Pressure Elevation   Note:   Care Management Activities:    - depression screen reviewed - home or ambulatory blood pressure monitoring encouraged       Patient Care Plan: RNCM: Diabetes Type 2 (Adult)    Problem Identified: RNCM: Glycemic Management (Diabetes, Type 2)   Priority: Medium    Goal: RNCM: Glycemic Management Optimized   Priority: Medium  Note:   Objective:  . Lab Results .  Component . Value . Date .   [name] HGBA1C . 7.0 (H) . 06/13/2020 .    Lab Results  Component Value Date   CREATININE 0.89 03/01/2020   CREATININE 0.76 06/16/2019   CREATININE 0.74 03/09/2019 .   [name] No results found for: EGFR Current Barriers:  [name] Knowledge Deficits related to basic Diabetes pathophysiology and self care/management . Knowledge Deficits related to medications used for management of diabetes . Unable to independently manage DM . Does not attend all scheduled provider appointments Case Manager Clinical Goal(s):  [name] Over the next 120 days, patient will demonstrate improved adherence to prescribed treatment plan for diabetes self care/management as evidenced by:  . daily monitoring and recording of CBG  . adherence to ADA/ carb modified diet . adherence to prescribed  medication regimen Interventions:  . Provided education to patient about basic DM disease process . Reviewed medications with patient and discussed importance of medication adherence . Discussed plans with patient for ongoing care management follow up and provided patient with direct contact information for care management team . Provided patient with written educational materials related to hypo and hyperglycemia and importance of correct treatment. 11-05-2020: The patient denies any lows at this time. States her appetite is good. Has upcoming blood work.  . Reviewed scheduled/upcoming provider appointments including: 11-21-2020 at 240 pm . Advised patient, providing education and rationale, to check cbg BID and record, calling pcp for findings outside established parameters.   . Review of patient status, including review of consultants reports, relevant laboratory and other test results, and medications completed. Patient Goals/Self-Care  Activities . Over the next 120 days, patient will:  - UNABLE to independently manage DM as evidence of hemoglobin A1C 7.0 Self administers oral medications as prescribed Attends all scheduled provider appointments Checks blood sugars as prescribed and utilize hyper and hypoglycemia protocol as needed Adheres to prescribed ADA/carb modified - barriers to adherence to treatment plan identified - blood glucose monitoring encouraged - individualized medical nutrition therapy provided - resources required to improve adherence to care identified - self-awareness of signs/symptoms of hypo or hyperglycemia encouraged - use of blood glucose monitoring log promoted Follow Up Plan: Telephone follow up appointment with care management team member scheduled for: 01-08-2021 at 1:00 pm   Task: RNCM: Alleviate Barriers to Glycemic Management   Note:   Care Management Activities:    - barriers to adherence to treatment plan identified - blood glucose monitoring  encouraged - individualized medical nutrition therapy provided - resources required to improve adherence to care identified - self-awareness of signs/symptoms of hypo or hyperglycemia encouraged - use of blood glucose monitoring log promoted       Patient Care Plan: General Social Work (Adult)    Problem Identified: Health Promotion or Disease Self-Management (General Plan of Care)     Goal: Self-Management Plan Developed   Start Date: 09/11/2020  Priority: Medium  Note:   Evidence-based guidance:   Review biopsychosocial determinants of health screens.   Determine level of modifiable health risk.   Assess level of patient activation, level of readiness, importance and confidence to make changes.   Evoke change talk using open-ended questions, pros and cons, as well as looking forward.   Identify areas where behavior change may lead to improved health.   Partner with patient to develop a robust self-management plan that includes lifestyle factors, such as weight loss, exercise and healthy nutrition, as well as goals specific to disease risks.   Support patient and family/caregiver active participation in decision-making and self-management plan.   Implement additional goals and interventions based on identified risk factors to reduce health risk.   Facilitate advance care planning.   Review need for preventive screening based on age, sex, family history and health history.   Notes:   Timeframe:  Long-Range Goal Priority:  Medium Start Date: 09/11/20                          Expected End Date: 11/09/20                   Follow Up Date-90 days from 09/11/20   - avoid negative self-talk - develop a personal safety plan - develop a plan to deal with triggers like holidays, anniversaries - exercise at least 2 to 3 times per week - have a plan for how to handle bad days - journal feelings and what helps to feel better or worse - spend time or talk with others at least 2 to 3  times per week - spend time or talk with others every day - watch for early signs of feeling worse - write in journal every day    Why is this important?    Keeping track of your progress will help your treatment team find the right mix of medicine and therapy for you.   Write in your journal every day.   Day-to-day changes in depression symptoms are normal. It may be more helpful to check your progress at the end of each week instead of every day.  Current Barriers:  . Chronic Mental Health needs related to depression, anxiety and most recently- grief . Limited social support . ADL IADL limitations . Mental Health Concerns  . Social Isolation . Memory Deficits . Suicidal Ideation/Homicidal Ideation: No  Clinical Social Work Goal(s):  [name] Over the next 120 days, patient will work with SW bi-monthly by telephone or in person to reduce or manage symptoms related to depression, grief and anxiety . Over the next 120 days, patient will demonstrate improved health management independence as evidenced by implementing appropriate self-care and mental health coping tools into her daily routine to combat stressors.  Interventions: . Patient interviewed and appropriate assessments performed: brief mental health assessment . LCSW spoke with patient on 09/11/20. Patient reports that she is stable. She shares that she keeps the telephone by hear 24/7 in case of an emergency. She shares that she has large family that consist of 4 children, 4 grandchildren and 6 great grandchildren and with their assistance she is able to take appropriate care of herself. Patient is very adamant about not wanting long term placement. She shares that her daughter is now residing with her since she lost her last aide. . Patient reports that recent medication adjustment by PCP has been helpful to her chronic pain. . Patient shares that she has been experiencing ongoing tremors (especially in her left hand) and PCP  referred her to a neurologist. Dr. Melrose Nakayama had her start Sinemet 25/100 1 pill 3 times a day for this concern and patient reports that she has already seen a difference. She is hopeful that this medication will continue to be effective. . Patient has a strong family support network which includes her daughter who recently moved in with her, a son that resides with her but works often and another son that lives across from her. They all provide ongoing care giving and supervision but patient continues to have level of care concerns as they are only able to do so much and patient is not agreeable to placement. . Patient shares that her granddaughter visits her often for socialization with her young great grandchildren.  . Family have made great efforts to accommodate patient and her safety needs within her home. They completed some home repairs in her bedroom to improve patient's living condition. Family reported in the past that they eventually want to build a walk in shower for patient as well.  . Patient shares that she often sits in her lift chair and watches television. Health self-care education provided to encourage patient to increase physical activity.  . Patient interviewed and appropriate assessments performed . Provided mental health counseling with regard to anxiety, depression, grief support. Patient lost her spouse of 46 years and brother. Patient denies wanting any mental health involvement or grief support. LCSW provided patient with information about available grief support resources within the area ([facility] and [facility]). LCSW sent an email in the past to family with this information in case she wishes to utilize these resources. Patient worked with Pharmacist, community [facility] in the past but discontinued services after 3 telephonic visits because family reported that patient was not willing to talk or open up with therapist. Patient declines needing mental health or grief support  on 09/11/20.  [name] Discussed plans with patient for ongoing care management follow up and provided patient with direct contact information for care management team . Collaborated with primary care provider re: medication concerns . Assisted patient/caregiver with obtaining information about health plan  benefits . Provided education and assistance to client regarding Advanced Directives. . Provided education to patient/caregiver regarding level of care options. . Provided education to patient/caregiver about Hospice and/or Palliative Care services . Patient declines any recent falls.  . Patient's daughter reported having to start therapy due to ongoing stress and caregiver burnout.   Patient Self Care Activities:  [name] Motivation for treatment . Strong family or social support  Patient Coping Strengths:  . Supportive Relationships . Family . Hopefulness  Patient Self Care Deficits:  . Lacks social connections . Unable to perform ADLs independently . Unable to perform IADLs independently  Please see past updates related to this goal by clicking on the "Past Updates" button in the selected goal     Task: Mutually Develop and Royce Macadamia Achievement of Patient Goals   Note:   Care Management Activities:    - advance care planning facilitated - barriers to meeting goals identified - change-talk evoked - choices provided - collaboration with team encouraged - decision-making supported - difficulty of making life-long changes acknowledged - health risks reviewed - problem-solving facilitated - questions answered - readiness for change evaluated - reassurance provided - resources needed to meet goals identified - self-reflection promoted - self-reliance encouraged - verbalization of feelings encouraged    Notes:     Patient verbalizes understanding of instructions provided today and agrees to view in [place].   Telephone follow up appointment with care management team member  scheduled for: 01-08-2021 at 1 pm  [name] RN, MSN, [facility] Mobile: [phone]

## 2020-11-11 ENCOUNTER — Ambulatory Visit: Payer: Medicare HMO | Admitting: Licensed Clinical Social Worker

## 2020-11-11 DIAGNOSIS — F3342 Major depressive disorder, recurrent, in full remission: Secondary | ICD-10-CM

## 2020-11-11 DIAGNOSIS — E1140 Type 2 diabetes mellitus with diabetic neuropathy, unspecified: Secondary | ICD-10-CM | POA: Diagnosis not present

## 2020-11-11 DIAGNOSIS — M797 Fibromyalgia: Secondary | ICD-10-CM

## 2020-11-11 DIAGNOSIS — I10 Essential (primary) hypertension: Secondary | ICD-10-CM | POA: Diagnosis not present

## 2020-11-11 DIAGNOSIS — F419 Anxiety disorder, unspecified: Secondary | ICD-10-CM

## 2020-11-11 NOTE — Chronic Care Management (AMB) (Signed)
Chronic Care Management    Clinical Social Work Note  11/11/2020 Name: Cynthia Jones MRN: [id number] DOB: [birth year]  Cynthia Jones is a 80 y.o. year old female who is a primary care patient of Vigg, Avanti, MD. The CCM team was consulted to assist the patient with chronic disease management and/or care coordination needs related to: Level of Care Concerns, Mental Health Counseling and Resources and Grief Counseling.   Engaged with patient by telephone for follow up visit in response to provider referral for social work chronic care management and care coordination services.   Consent to Services:  The patient was given the following information about Chronic Care Management services today, agreed to services, and gave verbal consent: 1. CCM service includes personalized support from designated clinical staff supervised by the primary care provider, including individualized plan of care and coordination with other care providers 2. 24/7 contact phone numbers for assistance for urgent and routine care needs. 3. Service will only be billed when office clinical staff spend 20 minutes or more in a month to coordinate care. 4. Only one practitioner may furnish and bill the service in a calendar month. 5.The patient may stop CCM services at any time (effective at the end of the month) by phone call to the office staff. 6. The patient will be responsible for cost sharing (co-pay) of up to 20% of the service fee (after annual deductible is met). Patient agreed to services and consent obtained.  Patient agreed to services and consent obtained.   Assessment: Review of patient past medical history, allergies, medications, and health status, including review of relevant consultants reports was performed today as part of a comprehensive evaluation and provision of chronic care management and care coordination services.     SDOH (Social Determinants of Health) assessments and interventions performed:     Advanced Directives Status: See Care Plan for related entries.  CCM Care Plan  Allergies  Allergen Reactions  . Nsaids Other (See Comments)    GI upset, hx of H pylori and gastric ulcer  . Erythromycin Nausea Only    Outpatient Encounter Medications as of 11/11/2020  Medication Sig  . b complex vitamins tablet Take 1 tablet by mouth daily.  . benzonatate (TESSALON) 200 MG capsule Take 1 capsule (200 mg total) by mouth 2 (two) times daily as needed for cough.  . carbidopa-levodopa (SINEMET IR) 25-100 MG tablet Take 1 tablet by mouth in the morning, at noon, and at bedtime.  . carvedilol (COREG) 6.25 MG tablet TAKE 1 TABLET BY MOUTH TWICE A DAY WITH A MEAL  . diclofenac Sodium (VOLTAREN) 1 % GEL Apply 2 g topically as needed.   [name] esomeprazole (NEXIUM) 40 MG capsule Take 40 mg by mouth as needed.   [name] levothyroxine (SYNTHROID) 50 MCG tablet TAKE 1 TABLET BY MOUTH EVERY DAY  . lidocaine (LIDODERM) 5 % Place 1 patch onto the skin daily. Remove & Discard patch within 12 hours or as directed by MD  . losartan (COZAAR) 100 MG tablet Take 1 tablet (100 mg total) by mouth daily.  [name] nystatin (MYCOSTATIN/NYSTOP) powder APPLY TO AFFECTED AREA 3 TIMES A DAY  . pregabalin (LYRICA) 25 MG capsule Take 1 capsule (25 mg total) by mouth daily.  . sucralfate (CARAFATE) 1 g tablet Take 1 tablet (1 g total) by mouth 4 (four) times daily -  with meals and at bedtime.  . traMADol (ULTRAM) 50 MG tablet Take 1 tablet (50 mg total) by mouth 3 (three)  times daily as needed for severe pain. Must last 30 days  . UNABLE TO FIND Take by mouth as needed. Cor[place] HBP  . UNABLE TO FIND Take by mouth daily. CBD gummies  . Vitamin D, Ergocalciferol, (DRISDOL) 1.25 MG (50000 UNIT) CAPS capsule TAKE 1 CAPSULE (50,000 UNITS TOTAL) BY MOUTH EVERY 7 (SEVEN) DAYS   No facility-administered encounter medications on file as of 11/11/2020.    Patient Active Problem List   Diagnosis Date Noted  . Parkinson disease ([place])  09/25/2020  . DDD (degenerative disc disease), lumbosacral 07/10/2020  . Chronic lower extremity pain (Bilateral) 07/10/2020  . Other intervertebral disc degeneration, lumbar region 07/10/2020  . Change in voice 07/02/2020  . Difficulty walking 07/02/2020  . Tremor 07/02/2020  . Thyroiditis 12/19/2019  . Chronic knee pain (Left) 07/05/2019  . Neurogenic pain 11/01/2018  . Morbid obesity with BMI of 40.0-44.9, adult ([place]) 09/20/2018  . Hypothyroid 09/15/2018  . Vitamin D deficiency 09/05/2018  . Elevated C-reactive protein (CRP) 08/09/2018  . Elevated sed rate 08/09/2018  . Pharmacologic therapy 08/08/2018  . Disorder of skeletal system 08/08/2018  . Problems influencing health status 08/08/2018  . Contusion of foot 03/09/2018  . Advanced care planning/counseling discussion 04/12/2017  . Lumbar spondylosis 10/07/2016  . Hypercholesteremia 09/14/2016  . Long term prescription opiate use 09/08/2016  . Long term prescription benzodiazepine use 09/08/2016  . Chronic pain syndrome 09/08/2016  . Chronic low back pain (2ry area of Pain) (Bilateral) (L>R) 09/08/2016  . Chronic knee pain (1ry area of Pain) (Bilateral) (L>R) 09/08/2016  . Cervicogenic headache 09/08/2016  . Chronic hip pain (Bilateral) 09/08/2016  . Chronic hand pain (Bilateral) (R>L) 09/08/2016  . Chronic foot pain (Left) 09/08/2016  . Insomnia 09/17/2015  . Folliculitis 64/33/2951  . History of total knee replacement (Right) 04/17/2015  . Fibromyalgia 04/09/2015  . Bilateral occipital neuralgia 04/09/2015  . Greater trochanteric bursitis 04/09/2015  . Osteoarthritis of knee (Left) 04/09/2015  . Sacroiliac joint disease 04/09/2015  . Lumbar facet syndrome (Bilateral) (L>R) 04/09/2015  . Neuropathy due to secondary diabetes ([place]) 04/09/2015  . Osteopenia 03/06/2015  . Anxiety 03/06/2015  . Depression 03/06/2015  . Hypertensive disorder 03/06/2015  . Diabetes mellitus ([place]) 03/06/2015  . Osteoarthritis 03/06/2015   . Seasonal allergic rhinitis 03/06/2015  . Obesity 03/06/2015  . Intertrigo 03/06/2015    Conditions to be addressed/monitored: Anxiety, Depression and Dementia; Mental Health Concerns   Care Plan : General Social Work (Adult)  Updates made by Greg Cutter, LCSW since 11/11/2020 12:00 AM    Problem: Health Promotion or Disease Self-Management (General Plan of Care)     Long-Range Goal: Self-Management Plan Developed   Start Date: 11/11/2020  Priority: Medium  Note:   Evidence-based guidance:   Review biopsychosocial determinants of health screens.   Determine level of modifiable health risk.   Assess level of patient activation, level of readiness, importance and confidence to make changes.   Evoke change talk using open-ended questions, pros and cons, as well as looking forward.   Identify areas where behavior change may lead to improved health.   Partner with patient to develop a robust self-management plan that includes lifestyle factors, such as weight loss, exercise and healthy nutrition, as well as goals specific to disease risks.   Support patient and family/caregiver active participation in decision-making and self-management plan.   Implement additional goals and interventions based on identified risk factors to reduce health risk.   Facilitate advance care planning.   Review need for  preventive screening based on age, sex, family history and health history.   Notes:   Timeframe:  Long-Range Goal Priority:  Medium Start Date: 11/11/20                          Expected End Date: 02/11/21                   Follow Up Date-12/30/20   - avoid negative self-talk - develop a personal safety plan - develop a plan to deal with triggers like holidays, anniversaries - exercise at least 2 to 3 times per week - have a plan for how to handle bad days - journal feelings and what helps to feel better or worse - spend time or talk with others at least 2 to 3 times per  week - spend time or talk with others every day - watch for early signs of feeling worse - write in journal every day    Why is this important?    Keeping track of your progress will help your treatment team find the right mix of medicine and therapy for you.   Write in your journal every day.   Day-to-day changes in depression symptoms are normal. It may be more helpful to check your progress at the end of each week instead of every day.     Current Barriers:  . Chronic Mental Health needs related to depression, anxiety and most recently- grief . Limited social support . ADL IADL limitations . Mental Health Concerns  . Social Isolation . Memory Deficits . Suicidal Ideation/Homicidal Ideation: No  Clinical Social Work Goal(s):  [name] Over the next 120 days, patient will work with SW bi-monthly by telephone or in person to reduce or manage symptoms related to depression, grief and anxiety . Over the next 120 days, patient will demonstrate improved health management independence as evidenced by implementing appropriate self-care and mental health coping tools into her daily routine to combat stressors.  Interventions: . Patient interviewed and appropriate assessments performed: brief mental health assessment . LCSW spoke with patient on 11/11/20. Patient reports that she is stable. She shares that she keeps the telephone by hear 24/7 in case of an emergency. She shares that she has large family that consist of 4 children, 4 grandchildren and 6 great grandchildren and with their assistance she is able to take appropriate care of herself. Patient is very adamant about not wanting long term placement. She shares that her daughter is now residing with her since she lost her last aide. Patient reports that she is able to stay home now with her daughter's help. . Patient reports that recent medication adjustment by PCP has been helpful to her chronic pain. . Patient shares that she has been  experiencing ongoing tremors (especially in her left hand) and PCP referred her to a neurologist. Dr. Melrose Nakayama had her start Sinemet 25/100 1 pill 3 times a day for this concern and patient reports that she has already seen a difference. She is hopeful that this medication will continue to be effective. Patient continues to see Dr. Melrose Nakayama.  . Patient has a strong family support network which includes her daughter who recently moved in with her, a son that resides with her but works often and another son that lives across from her. They all provide ongoing care giving and supervision but patient continues to have level of care concerns as they are only able to do so much  and patient is not agreeable to placement. . Patient shares that her granddaughter visits her often for socialization with her young great grandchildren.  . Family have made great efforts to accommodate patient and her safety needs within her home. They completed some home repairs in her bedroom to improve patient's living condition. Family reported in the past that they eventually want to build a walk in shower for patient as well.  . Patient shares that she often sits in her lift chair and watches television. Health self-care education provided to encourage patient to increase physical activity.  . Patient interviewed and appropriate assessments performed . Provided mental health counseling with regard to anxiety, depression, grief support. Patient lost her spouse of 36 years and brother. Patient denies wanting any mental health involvement or grief support. LCSW provided patient with information about available grief support resources within the area (Grief Share and [facility]). LCSW sent an email in the past to family with this information in case she wishes to utilize these resources. Patient worked with Pharmacist, community at [facility] in the past but discontinued services after 3 telephonic visits because family reported that patient was  not willing to talk or open up with therapist. Patient declines needing mental health or grief support on 09/11/20.  [name] Discussed plans with patient for ongoing care management follow up and provided patient with direct contact information for care management team . Collaborated with primary care provider re: medication concerns . Assisted patient/caregiver with obtaining information about health plan benefits . Provided education and assistance to client regarding Advanced Directives. . Provided education to patient/caregiver regarding level of care options. . Provided education to patient/caregiver about Hospice and/or Palliative Care services . Patient declines any recent falls.  . Patient's daughter reported having to start therapy due to ongoing stress and caregiver burnout. . Patient reports ongoing chronic pain with her fibromyalgia diagnosis. Pain medication and self-care education provided.     Patient Self Care Activities:  [name] Motivation for treatment . Strong family or social support  Patient Coping Strengths:  . Supportive Relationships . Family . Hopefulness  Patient Self Care Deficits:  . Lacks social connections . Unable to perform ADLs independently . Unable to perform IADLs independently  Please see past updates related to this goal by clicking on the "Past Updates" button in the selected goal        Follow Up Plan: SW will follow up with patient by phone over the next quarter      Eula Fried, [place], MSW, [email or web address] Phone: [phone]

## 2020-11-11 NOTE — Patient Instructions (Signed)
Licensed Clinical Social Worker Visit Information  Goals we discussed today:  Goals Addressed            This Visit's Progress   . SW-Track and Manage My Symptoms-Depression       Evidence-based guidance:   Review biopsychosocial determinants of health screens.   Determine level of modifiable health risk.   Assess level of patient activation, level of readiness, importance and confidence to make changes.   Evoke change talk using open-ended questions, pros and cons, as well as looking forward.   Identify areas where behavior change may lead to improved health.   Partner with patient to develop a robust self-management plan that includes lifestyle factors, such as weight loss, exercise and healthy nutrition, as well as goals specific to disease risks.   Support patient and family/caregiver active participation in decision-making and self-management plan.   Implement additional goals and interventions based on identified risk factors to reduce health risk.   Facilitate advance care planning.   Review need for preventive screening based on age, sex, family history and health history.   Notes:   Timeframe:  Long-Range Goal Priority:  Medium Start Date: 11/11/20                          Expected End Date: 02/11/21                   Follow Up Date-12/30/20   - avoid negative self-talk - develop a personal safety plan - develop a plan to deal with triggers like holidays, anniversaries - exercise at least 2 to 3 times per week - have a plan for how to handle bad days - journal feelings and what helps to feel better or worse - spend time or talk with others at least 2 to 3 times per week - spend time or talk with others every day - watch for early signs of feeling worse - write in journal every day    Why is this important?    Keeping track of your progress will help your treatment team find the right mix of medicine and therapy for you.   Write in your journal every day.    Day-to-day changes in depression symptoms are normal. It may be more helpful to check your progress at the end of each week instead of every day.     Current Barriers:  . Chronic Mental Health needs related to depression, anxiety and most recently- grief . Limited social support . ADL IADL limitations . Mental Health Concerns  . Social Isolation . Memory Deficits . Suicidal Ideation/Homicidal Ideation: No  Clinical Social Work Goal(s):  [name] Over the next 120 days, patient will work with SW bi-monthly by telephone or in person to reduce or manage symptoms related to depression, grief and anxiety . Over the next 120 days, patient will demonstrate improved health management independence as evidenced by implementing appropriate self-care and mental health coping tools into her daily routine to combat stressors.  Interventions: . Patient interviewed and appropriate assessments performed: brief mental health assessment . LCSW spoke with patient on 11/11/20. Patient reports that she is stable. She shares that she keeps the telephone by hear 24/7 in case of an emergency. She shares that she has large family that consist of 4 children, 4 grandchildren and 6 great grandchildren and with their assistance she is able to take appropriate care of herself. Patient is very adamant about not wanting long term  placement. She shares that her daughter is now residing with her since she lost her last aide. Patient reports that she is able to stay home now with her daughter's help. . Patient reports that recent medication adjustment by PCP has been helpful to her chronic pain. . Patient shares that she has been experiencing ongoing tremors (especially in her left hand) and PCP referred her to a neurologist. Dr. Malvin Johns had her start Sinemet 25/100 1 pill 3 times a day for this concern and patient reports that she has already seen a difference. She is hopeful that this medication will continue to be effective. Patient  continues to see Dr. Malvin Johns.  . Patient has a strong family support network which includes her daughter who recently moved in with her, a son that resides with her but works often and another son that lives across from her. They all provide ongoing care giving and supervision but patient continues to have level of care concerns as they are only able to do so much and patient is not agreeable to placement. . Patient shares that her granddaughter visits her often for socialization with her young great grandchildren.  . Family have made great efforts to accommodate patient and her safety needs within her home. They completed some home repairs in her bedroom to improve patient's living condition. Family reported in the past that they eventually want to build a walk in shower for patient as well.  . Patient shares that she often sits in her lift chair and watches television. Health self-care education provided to encourage patient to increase physical activity.  . Patient interviewed and appropriate assessments performed . Provided mental health counseling with regard to anxiety, depression, grief support. Patient lost her spouse of 50 years and brother. Patient denies wanting any mental health involvement or grief support. LCSW provided patient with information about available grief support resources within the area (Grief Share and [facility]). LCSW sent an email in the past to family with this information in case she wishes to utilize these resources. Patient worked with Tax adviser at [facility] in the past but discontinued services after 3 telephonic visits because family reported that patient was not willing to talk or open up with therapist. Patient declines needing mental health or grief support on 09/11/20.  [name] Discussed plans with patient for ongoing care management follow up and provided patient with direct contact information for care management team . Collaborated with primary care provider re:  medication concerns . Assisted patient/caregiver with obtaining information about health plan benefits . Provided education and assistance to client regarding Advanced Directives. . Provided education to patient/caregiver regarding level of care options. . Provided education to patient/caregiver about Hospice and/or Palliative Care services . Patient declines any recent falls.  . Patient's daughter reported having to start therapy due to ongoing stress and caregiver burnout. . Patient reports ongoing chronic pain with her fibromyalgia diagnosis. Pain medication and self-care education provided.     Patient Self Care Activities:  [name] Motivation for treatment . Strong family or social support  Patient Coping Strengths:  . Supportive Relationships . Family . Hopefulness  Patient Self Care Deficits:  . Lacks social connections . Unable to perform ADLs independently . Unable to perform IADLs independently  Please see past updates related to this goal by clicking on the "Past Updates" button in the selected goal

## 2020-11-14 ENCOUNTER — Other Ambulatory Visit: Payer: Self-pay

## 2020-11-14 ENCOUNTER — Other Ambulatory Visit: Payer: Medicare HMO

## 2020-11-14 DIAGNOSIS — M1990 Unspecified osteoarthritis, unspecified site: Secondary | ICD-10-CM

## 2020-11-14 DIAGNOSIS — E1140 Type 2 diabetes mellitus with diabetic neuropathy, unspecified: Secondary | ICD-10-CM

## 2020-11-14 LAB — BAYER DCA HB A1C WAIVED: HB A1C (BAYER DCA - WAIVED): 7.1 % — ABNORMAL HIGH (ref <7.0)

## 2020-11-15 LAB — LIPID PANEL
Chol/HDL Ratio: 2.7 ratio (ref 0.0–4.4)
Cholesterol, Total: 167 mg/dL (ref 100–199)
HDL: 61 mg/dL (ref >39)
LDL Chol Calc (NIH): 92 mg/dL (ref 0–99)
Triglycerides: 75 mg/dL (ref 0–149)
VLDL Cholesterol Cal: 14 mg/dL (ref 5–40)

## 2020-11-15 LAB — TSH: TSH: 3.48 u[IU]/mL (ref 0.450–4.500)

## 2020-11-21 ENCOUNTER — Encounter: Payer: Self-pay | Admitting: Internal Medicine

## 2020-11-21 ENCOUNTER — Other Ambulatory Visit: Payer: Self-pay

## 2020-11-21 ENCOUNTER — Telehealth (INDEPENDENT_AMBULATORY_CARE_PROVIDER_SITE_OTHER): Payer: Medicare HMO | Admitting: Internal Medicine

## 2020-11-21 VITALS — BP 132/78 | HR 74 | Temp 98.6°F | Wt 220.0 lb

## 2020-11-21 DIAGNOSIS — R1310 Dysphagia, unspecified: Secondary | ICD-10-CM | POA: Diagnosis not present

## 2020-11-21 MED ORDER — ONDANSETRON HCL 8 MG PO TABS: 8.0000 mg | ORAL_TABLET | Freq: Three times a day (TID) | ORAL | 0 refills | Status: DC | PRN

## 2020-11-21 NOTE — Progress Notes (Signed)
BP 132/78   Pulse 74   Temp 98.6 F (37 C) (Temporal)   Wt 220 lb (99.8 kg)   SpO2 98%   BMI 42.97 kg/m    Subjective:    Patient ID: Cynthia Jones, female    DOB: September 27, 1940, 80 y.o.   MRN: [id number]  HPI: Cynthia Jones is a 80 y.o. female  Has had some difficulty eatoing weight 220 even has had some trouble with choking and no appetite   Knee Pain     Chief Complaint  Patient presents with  . Knee Pain    Left knee pain, xray was ordered, patient did not bother having it done  . decrease in appetite    Relevant past medical, surgical, family and social history reviewed and updated as indicated. Interim medical history since our last visit reviewed. Allergies and medications reviewed and updated.  Review of Systems  Per HPI unless specifically indicated above     Objective:    BP 132/78   Pulse 74   Temp 98.6 F (37 C) (Temporal)   Wt 220 lb (99.8 kg)   SpO2 98%   BMI 42.97 kg/m   Wt Readings from Last 3 Encounters:  11/21/20 220 lb (99.8 kg)  10/31/20 245 lb (111.1 kg)  09/12/20 245 lb (111.1 kg)    Physical Exam  Results for orders placed or performed in visit on 11/14/20  TSH  Result Value Ref Range   TSH 3.480 0.450 - 4.500 uIU/mL  Lipid panel  Result Value Ref Range   Cholesterol, Total 167 100 - 199 mg/dL   Triglycerides 75 0 - 149 mg/dL   HDL 61 >03 mg/dL   VLDL Cholesterol Cal 14 5 - 40 mg/dL   LDL Chol Calc (NIH) 92 0 - 99 mg/dL   Chol/HDL Ratio 2.7 0.0 - 4.4 ratio  Bayer DCA Hb A1c Waived  Result Value Ref Range   HB A1C (BAYER DCA - WAIVED) 7.1 (H) <7.0 %      Assessment & Plan:  1.Depression :  Is on  lyrica at low dose 25 mg . 80 % better per daughter  Is on cymbalta taper to off in 2 weeks.   2. GERD - take nexium as needed  Takes sucralfate   3. Dementia : sec to parkinsons.   4. Fibromyalgia - is on tizanidine uses prn.  Will continue lyrica  Helping per pt   5. parkinsons is on sinemet. Sees  neurology for such  to d.w them regarding if pt needs Ct head   6. Left knee arthrtiis :Will start pt on left knee check xray  lidoderm patches helping for now per pt.   7. Hypothyroidism ; continue 50 mcg of synthroid.   8. Prediabetes : a1c a little high given pt is 80 yrs old, will continue diet modifications as below  Lifestyle modifications advised to pt. A1c at   Portion control and avoiding high carb low fat diet advised.  Diet plan given to pt   exercise plan given and encouraged.  To increase exercise to 150 mins a week ie 21/2 hours a week. Pt verbalises understanding of the above.    Ref. Range 06/13/2020 13:56 11/14/2020 10:20  HB A1C (BAYER DCA - WAIVED) Latest Ref Range: <7.0 % 7.0 (H) 7.1 (H)   Problem List Items Addressed This Visit   None   Visit Diagnoses    Dysphagia, unspecified type    -  Primary   Relevant  Orders   SLP eval and treat       Follow up plan: No follow-ups on file.   Reason for Referral:  Has the referral been discussed with the patient?: yes  Designated contact for the referral if not the patient (name/phone number):  Daughter   Has the patient seen a specialist for this issue before?: neurology  .  If so, who (practice/provider)?   Does the patient have a provider or location preference for the referral?: no  . Would the patient like to see previous specialist if applicable? sure

## 2020-11-23 ENCOUNTER — Other Ambulatory Visit: Payer: Self-pay | Admitting: Family Medicine

## 2020-11-23 ENCOUNTER — Other Ambulatory Visit: Payer: Self-pay | Admitting: Nurse Practitioner

## 2020-11-23 DIAGNOSIS — I10 Essential (primary) hypertension: Secondary | ICD-10-CM

## 2020-11-23 NOTE — Telephone Encounter (Signed)
Discontinued by provider on 09/13/20.Refusing refill request.

## 2020-11-23 NOTE — Telephone Encounter (Signed)
Requested medication (s) are due for refill today Yes  Requested medication (s) are on the active medication list  Yes  Future visit scheduled  Yes  01/02/21  Note to clinic-This medication/dosage is not delegated for [facility] to refill.   Requested Prescriptions  Pending Prescriptions Disp Refills   Vitamin D, Ergocalciferol, (DRISDOL) 1.25 MG (50000 UNIT) CAPS capsule [Pharmacy Med Name: VITAMIN D2 1.25MG (50,000 UNIT)] 3 capsule 0    Sig: TAKE 1 CAPSULE (50,000 UNITS TOTAL) BY MOUTH EVERY 7 (SEVEN) DAYS      Endocrinology:  Vitamins - Vitamin D Supplementation Failed - 11/23/2020 12:49 PM      Failed - 50,000 IU strengths are not delegated      Failed - Phosphate in normal range and within 360 days    No results found for: PHOS        Failed - Vitamin D in normal range and within 360 days    25-Hydroxy, Vitamin D-3  Date Value Ref Range Status  08/08/2018 20 ng/mL Final   25-Hydroxy, Vitamin D-2  Date Value Ref Range Status  08/08/2018 <1.0 ng/mL Final   25-Hydroxy, Vitamin D  Date Value Ref Range Status  08/08/2018 20 (L) ng/mL Final    Comment:    Reference Range: All Ages: Target levels 30 - 100           Passed - Ca in normal range and within 360 days    Calcium  Date Value Ref Range Status  03/01/2020 9.4 8.7 - 10.3 mg/dL Final          Passed - Valid encounter within last 12 months    Recent Outpatient Visits           2 days ago Dysphagia, unspecified type   [facility] Vigg, Avanti, MD   3 weeks ago Arthritis   [facility] Vigg, Avanti, MD   2 months ago Suspected COVID-19 virus infection   [facility] [place], [place], DO   3 months ago Appointment canceled by hospital   [facility], Megan P, DO   5 months ago Type 2 diabetes mellitus with diabetic neuropathy, without long-term current use of insulin (HCC)   [facility] [place], Megan P, DO       Future Appointments              In 1 month Vigg, Avanti, MD [facility], [facility]

## 2020-11-23 NOTE — Telephone Encounter (Signed)
Approved per protocol. Future OV 01/02/21. Requested Prescriptions  Pending Prescriptions Disp Refills  . losartan (COZAAR) 100 MG tablet [Pharmacy Med Name: LOSARTAN POTASSIUM 100 MG TAB] 90 tablet 0    Sig: TAKE 1 TABLET BY MOUTH DAILY     Cardiovascular:  Angiotensin Receptor Blockers Failed - 11/23/2020 12:49 PM      Failed - Cr in normal range and within 180 days    Creatinine, Ser  Date Value Ref Range Status  03/01/2020 0.89 0.57 - 1.00 mg/dL Final         Failed - K in normal range and within 180 days    Potassium  Date Value Ref Range Status  03/01/2020 4.4 3.5 - 5.2 mmol/L Final         Passed - Patient is not pregnant      Passed - Last BP in normal range    BP Readings from Last 1 Encounters:  11/21/20 132/78         Passed - Valid encounter within last 6 months    Recent Outpatient Visits          2 days ago Dysphagia, unspecified type   [facility] Vigg, Avanti, MD   3 weeks ago Arthritis   [facility] Vigg, Avanti, MD   2 months ago Suspected COVID-19 virus infection   [facility] [place], [place], DO   3 months ago Appointment canceled by hospital   [facility], Megan P, DO   5 months ago Type 2 diabetes mellitus with diabetic neuropathy, without long-term current use of insulin (HCC)   [facility] [place], Megan P, DO      Future Appointments            In 1 month Vigg, Avanti, MD [facility], [facility]

## 2020-11-23 NOTE — Telephone Encounter (Signed)
Requested medication (s) are due for refill today   No  Requested medication (s) are on the active medication list  No. Is not on the patient's current profile. Provider discontinued on 09/13/20.  Note to clinic-Medication is not delegated for [facility] to refill     Requested Prescriptions  Pending Prescriptions Disp Refills   tiZANidine (ZANAFLEX) 4 MG tablet [Pharmacy Med Name: TIZANIDINE HCL 4 MG TABLET] 60 tablet 2    Sig: TAKE 1 TABLET BY MOUTH EVERY 6 HOURS AS NEEDED FOR MUSCLE SPASMS.      Not Delegated - Cardiovascular:  Alpha-2 Agonists - tizanidine Failed - 11/23/2020 12:49 PM      Failed - This refill cannot be delegated      Passed - Valid encounter within last 6 months    Recent Outpatient Visits           2 days ago Dysphagia, unspecified type   [facility] Vigg, Avanti, MD   3 weeks ago Arthritis   [facility] Vigg, Avanti, MD   2 months ago Suspected COVID-19 virus infection   [facility] [place], [place], DO   3 months ago Appointment canceled by hospital   [facility], Megan P, DO   5 months ago Type 2 diabetes mellitus with diabetic neuropathy, without long-term current use of insulin (HCC)   [facility] [place], Megan P, DO       Future Appointments             In 1 month Vigg, Avanti, MD [facility], [facility]

## 2020-11-23 NOTE — Telephone Encounter (Signed)
Approved per protocol. Future OV 01/02/21. Flexeril was discontinued by provider on 09/13/20 Requested Prescriptions  Pending Prescriptions Disp Refills  . cyclobenzaprine (FLEXERIL) 5 MG tablet [Pharmacy Med Name: CYCLOBENZAPRINE 5 MG TABLET] 90 tablet 0    Sig: TAKE 1 TABLET BY MOUTH THREE TIMES A DAY AS NEEDED FOR MUSCLE SPASMS     Not Delegated - Analgesics:  Muscle Relaxants Failed - 11/23/2020 12:49 PM      Failed - This refill cannot be delegated      Passed - Valid encounter within last 6 months    Recent Outpatient Visits          2 days ago Dysphagia, unspecified type   [facility] Vigg, Avanti, MD   3 weeks ago Arthritis   [facility] Vigg, Avanti, MD   2 months ago Suspected COVID-19 virus infection   [facility] [place], [place], DO   3 months ago Appointment canceled by hospital   [facility], Megan P, DO   5 months ago Type 2 diabetes mellitus with diabetic neuropathy, without long-term current use of insulin (HCC)   [facility] [place], Megan P, DO      Future Appointments            In 1 month Vigg, Avanti, MD [facility], [facility]           . sucralfate (CARAFATE) 1 g tablet [Pharmacy Med Name: SUCRALFATE 1 GM TABLET] 120 tablet 1    Sig: TAKE 1 TABLET (1 G TOTAL) BY MOUTH 4 (FOUR) TIMES DAILY - WITH MEALS AND AT BEDTIME.     Gastroenterology: Antiacids Passed - 11/23/2020 12:49 PM      Passed - Valid encounter within last 12 months    Recent Outpatient Visits          2 days ago Dysphagia, unspecified type   [facility] Vigg, Avanti, MD   3 weeks ago Arthritis   [facility] Vigg, Avanti, MD   2 months ago Suspected COVID-19 virus infection   [facility] [place], [place], DO   3 months ago Appointment canceled by hospital   [facility], Megan P, DO   5 months ago Type 2 diabetes mellitus with diabetic neuropathy,  without long-term current use of insulin (HCC)   [facility] [place], Megan P, DO      Future Appointments            In 1 month Vigg, Avanti, MD [facility], [facility]

## 2020-11-25 ENCOUNTER — Encounter: Payer: Self-pay | Admitting: Internal Medicine

## 2020-11-25 NOTE — Telephone Encounter (Signed)
Routing to provider. Patient has not had vitamin D checked in over a year. Last OV was 10/31/20.

## 2020-11-27 ENCOUNTER — Other Ambulatory Visit: Payer: Self-pay

## 2020-11-28 MED ORDER — PREGABALIN 25 MG PO CAPS: 25.0000 mg | ORAL_CAPSULE | Freq: Every day | ORAL | 1 refills | Status: DC

## 2020-12-03 ENCOUNTER — Telehealth: Payer: Self-pay | Admitting: Nurse Practitioner

## 2020-12-03 NOTE — Telephone Encounter (Signed)
Spoke with patient and have scheduled a Palliative f/u visit for 12/04/20 @ 12 Noon.

## 2020-12-04 ENCOUNTER — Other Ambulatory Visit: Payer: Medicare HMO | Admitting: Nurse Practitioner

## 2020-12-04 ENCOUNTER — Telehealth: Payer: Self-pay | Admitting: Nurse Practitioner

## 2020-12-04 ENCOUNTER — Other Ambulatory Visit: Payer: Self-pay

## 2020-12-04 NOTE — Telephone Encounter (Signed)
I attempted to call Ms. Zabawa to confirm f/u PC visit at 8:30am; 11:00am, 11:30am with no answer, message left. Ms. Laury Axon, granddaughter returned call at 12:30pm after scheduled PC f/u visit. We talked about visit scheduled, need to talk with Ms. Laury Axon or family prior to visit to confirm appointment time and covid screening. Since call came after visit, reschedule visit. Contact information given.

## 2020-12-11 ENCOUNTER — Telehealth: Payer: Self-pay | Admitting: Nurse Practitioner

## 2020-12-11 NOTE — Telephone Encounter (Signed)
I called Ms. Cynthia Jones to reschedule f/u PC visit, no answer, message left to return call with contact information

## 2020-12-18 ENCOUNTER — Ambulatory Visit: Payer: Medicare HMO | Admitting: Nurse Practitioner

## 2020-12-23 ENCOUNTER — Other Ambulatory Visit: Payer: Self-pay | Admitting: Family Medicine

## 2020-12-23 ENCOUNTER — Other Ambulatory Visit: Payer: Self-pay | Admitting: Internal Medicine

## 2020-12-23 DIAGNOSIS — E118 Type 2 diabetes mellitus with unspecified complications: Secondary | ICD-10-CM

## 2020-12-23 NOTE — Telephone Encounter (Signed)
Scheduled 5/5

## 2020-12-23 NOTE — Telephone Encounter (Signed)
Requested medication (s) are due for refill today: yes  Requested medication (s) are on the active medication list: yes  Last refill:  07/21/20 60 g 1 RF  Future visit scheduled: yes  Notes to clinic:  med not assigned to a protocol   Requested Prescriptions  Pending Prescriptions Disp Refills   nystatin (MYCOSTATIN/NYSTOP) powder [Pharmacy Med Name: NYSTATIN 100,000 UNIT/GM POWD] 60 g 1    Sig: APPLY TO AFFECTED AREA 3 TIMES A DAY      Off-Protocol Failed - 12/23/2020  3:37 PM      Failed - Medication not assigned to a protocol, review manually.      Passed - Valid encounter within last 12 months    Recent Outpatient Visits           1 month ago Dysphagia, unspecified type   [facility] Vigg, Avanti, MD   1 month ago Arthritis   [facility] Vigg, Avanti, MD   3 months ago Suspected COVID-19 virus infection   [facility], [place], DO   4 months ago Appointment canceled by hospital   [facility], Megan P, DO   6 months ago Type 2 diabetes mellitus with diabetic neuropathy, without long-term current use of insulin (HCC)   [facility] [place], Megan P, DO       Future Appointments             In 1 week Vigg, Avanti, MD [facility], [facility]

## 2020-12-23 NOTE — Telephone Encounter (Signed)
Requested medication (s) are due for refill today: yes  Requested medication (s) are on the active medication list: yes  Last refill:  11/25/20  Future visit scheduled: yes  Notes to clinic:  med not delegated to NT to RF   Requested Prescriptions  Pending Prescriptions Disp Refills   Vitamin D, Ergocalciferol, (DRISDOL) 1.25 MG (50000 UNIT) CAPS capsule [Pharmacy Med Name: VITAMIN D2 1.25MG (50,000 UNIT)] 3 capsule 0    Sig: TAKE 1 CAPSULE (50,000 UNITS TOTAL) BY MOUTH EVERY 7 (SEVEN) DAYS      Endocrinology:  Vitamins - Vitamin D Supplementation Failed - 12/23/2020  3:37 PM      Failed - 50,000 IU strengths are not delegated      Failed - Phosphate in normal range and within 360 days    No results found for: PHOS        Failed - Vitamin D in normal range and within 360 days    25-Hydroxy, Vitamin D-3  Date Value Ref Range Status  08/08/2018 20 ng/mL Final   25-Hydroxy, Vitamin D-2  Date Value Ref Range Status  08/08/2018 <1.0 ng/mL Final   25-Hydroxy, Vitamin D  Date Value Ref Range Status  08/08/2018 20 (L) ng/mL Final    Comment:    Reference Range: All Ages: Target levels 30 - 100           Passed - Ca in normal range and within 360 days    Calcium  Date Value Ref Range Status  03/01/2020 9.4 8.7 - 10.3 mg/dL Final          Passed - Valid encounter within last 12 months    Recent Outpatient Visits           1 month ago Dysphagia, unspecified type   [facility] Vigg, Avanti, MD   1 month ago Arthritis   [facility] Vigg, Avanti, MD   3 months ago Suspected COVID-19 virus infection   [facility] [place], Megan P, DO   4 months ago Appointment canceled by hospital   [facility], Megan P, DO   6 months ago Type 2 diabetes mellitus with diabetic neuropathy, without long-term current use of insulin (HCC)   [facility] [place], Megan P, DO       Future Appointments             In  1 week Vigg, Avanti, MD [facility], [facility]

## 2020-12-24 ENCOUNTER — Telehealth: Payer: Self-pay

## 2020-12-24 DIAGNOSIS — F419 Anxiety disorder, unspecified: Secondary | ICD-10-CM | POA: Diagnosis not present

## 2020-12-24 DIAGNOSIS — M25562 Pain in left knee: Secondary | ICD-10-CM | POA: Diagnosis not present

## 2020-12-24 DIAGNOSIS — G8929 Other chronic pain: Secondary | ICD-10-CM | POA: Diagnosis not present

## 2020-12-24 DIAGNOSIS — M5481 Occipital neuralgia: Secondary | ICD-10-CM | POA: Diagnosis not present

## 2020-12-24 DIAGNOSIS — G4486 Cervicogenic headache: Secondary | ICD-10-CM | POA: Diagnosis not present

## 2020-12-24 DIAGNOSIS — M25561 Pain in right knee: Secondary | ICD-10-CM | POA: Diagnosis not present

## 2020-12-24 NOTE — Telephone Encounter (Signed)
PA started for Nystatin powder 60 g through cover my meds.   Awaiting on determination

## 2020-12-25 ENCOUNTER — Telehealth: Payer: Self-pay

## 2020-12-25 NOTE — Telephone Encounter (Signed)
PA for Nystatin powder has been approved

## 2020-12-30 ENCOUNTER — Telehealth: Payer: Self-pay

## 2021-01-02 ENCOUNTER — Telehealth (INDEPENDENT_AMBULATORY_CARE_PROVIDER_SITE_OTHER): Payer: Medicare HMO | Admitting: Internal Medicine

## 2021-01-02 ENCOUNTER — Encounter: Payer: Self-pay | Admitting: Internal Medicine

## 2021-01-02 VITALS — BP 128/54 | HR 67 | Temp 96.8°F | Wt 230.6 lb

## 2021-01-02 DIAGNOSIS — R251 Tremor, unspecified: Secondary | ICD-10-CM

## 2021-01-02 DIAGNOSIS — E039 Hypothyroidism, unspecified: Secondary | ICD-10-CM | POA: Diagnosis not present

## 2021-01-02 DIAGNOSIS — E119 Type 2 diabetes mellitus without complications: Secondary | ICD-10-CM | POA: Diagnosis not present

## 2021-01-02 DIAGNOSIS — G20 Parkinson's disease: Secondary | ICD-10-CM | POA: Diagnosis not present

## 2021-01-02 DIAGNOSIS — E1140 Type 2 diabetes mellitus with diabetic neuropathy, unspecified: Secondary | ICD-10-CM | POA: Diagnosis not present

## 2021-01-02 DIAGNOSIS — R262 Difficulty in walking, not elsewhere classified: Secondary | ICD-10-CM

## 2021-01-02 DIAGNOSIS — F0280 Dementia in other diseases classified elsewhere without behavioral disturbance: Secondary | ICD-10-CM | POA: Diagnosis not present

## 2021-01-02 MED ORDER — MIRTAZAPINE 7.5 MG PO TABS: 7.5000 mg | ORAL_TABLET | Freq: Every day | ORAL | 1 refills | Status: DC

## 2021-01-02 MED ORDER — METFORMIN HCL 500 MG PO TABS: 500.0000 mg | ORAL_TABLET | Freq: Every day | ORAL | 3 refills | Status: DC

## 2021-01-02 NOTE — Progress Notes (Signed)
BP (!) 128/54   Pulse 67   Temp (!) 96.8 F (36 C) (Oral)   Wt 230 lb 9.6 oz (104.6 kg)   SpO2 98%   BMI 45.04 kg/m    Subjective:    Patient ID: Cynthia Jones, female    DOB: [birth year], 80 y.o.   MRN: [id number]  HPI: [name] is a 80 y.o. female  Loss of appetite and anxiety noted per granddaughter. Tremors better since neurology increased her sinemet dose. She is able to bathe herself , Gearlean Alf is better per granddaughter as well. She is going outside some.  Her appetite is decreased tho her depression is better on lyrica  I connected with Ty Oshima Oquin on 01/02/21 by a video enabled telemedicine application and verified that I am speaking with the correct person using two identifiers.  I discussed the limitations of evaluation and management by telemedicine. The patient expressed understanding and agreed to proceed. "I discussed the limitations of evaluation and management by telemedicine and the availability of in person appointments. The patient expressed understanding and agreed to proceed"     This visit was completed via MyChart due to the restrictions of the COVID-19 pandemic. All issues as above were discussed and addressed. Physical exam was done as above through visual confirmation on MyChart. If it was felt that the patient should be evaluated in the office, they were directed there. The patient verbally consented to this visit. Location of the patient: home Location of the provider: work Those involved with this call:  Provider: Loura Pardon, MD CMA: Tristan Schroeder, CMA Front Desk/Registration: Kandice Hams  Time spent on call: 20 minutes with patient face to face via video conference. More than 50% of this time was spent in counseling and coordination of care. 10 minutes total spent in review of patient's record and preparation of their chart.    Hypertension This is a chronic problem. Associated symptoms include anxiety.  Depression         This is a chronic (80 - % ) problem.  The current episode started 1 to 4 weeks ago.   The onset quality is gradual.   Past medical history includes anxiety.   Anxiety Presents for follow-up visit.      Chief Complaint  Patient presents with  . Diabetes  . Hypertension  . Depression  . Anxiety    Relevant past medical, surgical, family and social history reviewed and updated as indicated. Interim medical history since our last visit reviewed. Allergies and medications reviewed and updated.  Review of Systems  Psychiatric/Behavioral: Positive for depression.    Per HPI unless specifically indicated above     Objective:    BP (!) 128/54   Pulse 67   Temp (!) 96.8 F (36 C) (Oral)   Wt 230 lb 9.6 oz (104.6 kg)   SpO2 98%   BMI 45.04 kg/m   Wt Readings from Last 3 Encounters:  01/02/21 230 lb 9.6 oz (104.6 kg)  11/21/20 220 lb (99.8 kg)  10/31/20 245 lb (111.1 kg)    Physical Exam Vitals reviewed: virtual      Results for orders placed or performed in visit on 11/14/20  TSH  Result Value Ref Range   TSH 3.480 0.450 - 4.500 uIU/mL  Lipid panel  Result Value Ref Range   Cholesterol, Total 167 100 - 199 mg/dL   Triglycerides 75 0 - 149 mg/dL   HDL 61 >19 mg/dL   VLDL Cholesterol Cal 14  5 - 40 mg/dL   LDL Chol Calc (NIH) 92 0 - 99 mg/dL   Chol/HDL Ratio 2.7 0.0 - 4.4 ratio  Bayer DCA Hb A1c Waived  Result Value Ref Range   HB A1C (BAYER DCA - WAIVED) 7.1 (H) <7.0 %        Current Outpatient Medications:  .  b complex vitamins tablet, Take 1 tablet by mouth daily., Disp: , Rfl:  .  carbidopa-levodopa (SINEMET IR) 25-100 MG tablet, Take 1 tablet by mouth 4 (four) times daily., Disp: , Rfl:  .  carvedilol (COREG) 6.25 MG tablet, TAKE 1 TABLET BY MOUTH TWICE A DAY WITH A MEAL, Disp: 180 tablet, Rfl: 2 .  diclofenac Sodium (VOLTAREN) 1 % GEL, Apply 2 g topically as needed. , Disp: , Rfl:  .  esomeprazole (NEXIUM) 40 MG capsule, Take 40 mg by mouth as  needed. , Disp: , Rfl:  .  levothyroxine (SYNTHROID) 50 MCG tablet, TAKE 1 TABLET BY MOUTH EVERY DAY, Disp: 90 tablet, Rfl: 1 .  lidocaine (LIDODERM) 5 %, Place 1 patch onto the skin daily. Remove & Discard patch within 12 hours or as directed by MD, Disp: 30 patch, Rfl: 2 .  losartan (COZAAR) 100 MG tablet, TAKE 1 TABLET BY MOUTH DAILY, Disp: 90 tablet, Rfl: 0 .  nystatin (MYCOSTATIN/NYSTOP) powder, APPLY TO AFFECTED AREA 3 TIMES A DAY, Disp: 60 g, Rfl: 1 .  ondansetron (ZOFRAN) 8 MG tablet, Take 1 tablet (8 mg total) by mouth every 8 (eight) hours as needed for nausea or vomiting., Disp: 20 tablet, Rfl: 0 .  pregabalin (LYRICA) 25 MG capsule, Take 1 capsule (25 mg total) by mouth daily., Disp: 30 capsule, Rfl: 1 .  sucralfate (CARAFATE) 1 g tablet, TAKE 1 TABLET (1 G TOTAL) BY MOUTH 4 (FOUR) TIMES DAILY - WITH MEALS AND AT BEDTIME., Disp: 120 tablet, Rfl: 1 .  UNABLE TO FIND, Take by mouth as needed. [place]iden HBP, Disp: , Rfl:  .  UNABLE TO FIND, Take by mouth daily. CBD gummies, Disp: , Rfl:  .  Vitamin D, Ergocalciferol, (DRISDOL) 1.25 MG (50000 UNIT) CAPS capsule, TAKE 1 CAPSULE (50,000 UNITS TOTAL) BY MOUTH EVERY 7 (SEVEN) DAYS, Disp: 3 capsule, Rfl: 0 .  benzonatate (TESSALON) 200 MG capsule, Take 1 capsule (200 mg total) by mouth 2 (two) times daily as needed for cough. (Patient not taking: Reported on 01/02/2021), Disp: 20 capsule, Rfl: 0 .  traMADol (ULTRAM) 50 MG tablet, Take 1 tablet (50 mg total) by mouth 3 (three) times daily as needed for severe pain. Must last 30 days, Disp: 90 tablet, Rfl: 0    Assessment & Plan:  1. Gait / tremors better with increased dose of sinemet.  Granddaughter is with pt she a hospice nurse.   2. .Depression : Is on  lyrica at low dose 25 mg . 80 - 90 % better per daughter  Is on cymbalta taper to off in 2 weeks.   2. GERD - take nexium as needed  Takes sucralfate   3. Dementia : sec to parkinsons.    Will start pt on remeron for such   4.  Fibromyalgia - is on tizanidine uses prn.  Will continue lyrica  Helping per pt   5. Parkinsons is on sinemet. Sees neurology for such  to d.w them regarding if pt needs Ct head    6. Left knee arthrtiis :Will start pt on left knee check xray  lidoderm patches helping for now per  pt. Right total knee replacement without complicating feature.  7. Hypothyroidism ; continue 50 mcg of synthroid Stable. TSH wnl.  8. DM : a1c 7.1 will restart pt on metformin 500 mg daily check HbA1c,  urine  microalbumin  diabetic diet plan given to pt  adviced regarding hypoglycemia and instructions given to pt today on how to prevent and treat the same if it were to occur. pt acknowledges the plan and voices understanding of the same.  exercise plan given and encouraged.   advice diabetic yearly podiatry, ophthalmology , nutritionist , dental check q 6 months.   Ref. Range 06/13/2020 13:56 11/14/2020 10:20  HB A1C (BAYER DCA - WAIVED) Latest Ref Range: <7.0 % 7.0 (H) 7.1 (H)     Problem List Items Addressed This Visit   None      Follow up plan: No follow-ups on file.

## 2021-01-08 ENCOUNTER — Telehealth: Payer: Self-pay | Admitting: General Practice

## 2021-01-08 ENCOUNTER — Ambulatory Visit (INDEPENDENT_AMBULATORY_CARE_PROVIDER_SITE_OTHER): Payer: Medicare HMO | Admitting: General Practice

## 2021-01-08 DIAGNOSIS — F3342 Major depressive disorder, recurrent, in full remission: Secondary | ICD-10-CM | POA: Diagnosis not present

## 2021-01-08 DIAGNOSIS — E1140 Type 2 diabetes mellitus with diabetic neuropathy, unspecified: Secondary | ICD-10-CM | POA: Diagnosis not present

## 2021-01-08 DIAGNOSIS — G894 Chronic pain syndrome: Secondary | ICD-10-CM

## 2021-01-08 DIAGNOSIS — M545 Low back pain, unspecified: Secondary | ICD-10-CM

## 2021-01-08 DIAGNOSIS — I10 Essential (primary) hypertension: Secondary | ICD-10-CM | POA: Diagnosis not present

## 2021-01-08 DIAGNOSIS — F419 Anxiety disorder, unspecified: Secondary | ICD-10-CM

## 2021-01-08 DIAGNOSIS — M797 Fibromyalgia: Secondary | ICD-10-CM

## 2021-01-08 DIAGNOSIS — G8929 Other chronic pain: Secondary | ICD-10-CM

## 2021-01-08 NOTE — Chronic Care Management (AMB) (Signed)
Chronic Care Management   CCM RN Visit Note  01/08/2021 Name: Cynthia Jones MRN: [id number] DOB: 05/10/1941  Subjective: Cynthia Jones is a 80 y.o. year old female who is a primary care patient of Vigg, Avanti, MD. The care management team was consulted for assistance with disease management and care coordination needs.    Engaged with patient by telephone for follow up visit in response to provider referral for case management and/or care coordination services.   Consent to Services:  The patient was given information about Chronic Care Management services, agreed to services, and gave verbal consent prior to initiation of services.  Please see initial visit note for detailed documentation.   Patient agreed to services and verbal consent obtained.   Assessment: Review of patient past medical history, allergies, medications, health status, including review of consultants reports, laboratory and other test data, was performed as part of comprehensive evaluation and provision of chronic care management services.   SDOH (Social Determinants of Health) assessments and interventions performed:    CCM Care Plan  Allergies  Allergen Reactions  . Nsaids Other (See Comments)    GI upset, hx of H pylori and gastric ulcer  . Erythromycin Nausea Only    Outpatient Encounter Medications as of 01/08/2021  Medication Sig  . b complex vitamins tablet Take 1 tablet by mouth daily.  . carbidopa-levodopa (SINEMET IR) 25-100 MG tablet Take 1 tablet by mouth 4 (four) times daily.  . carvedilol (COREG) 6.25 MG tablet TAKE 1 TABLET BY MOUTH TWICE A DAY WITH A MEAL  . diclofenac Sodium (VOLTAREN) 1 % GEL Apply 2 g topically as needed.   [name] esomeprazole (NEXIUM) 40 MG capsule Take 40 mg by mouth as needed.   [name] levothyroxine (SYNTHROID) 50 MCG tablet TAKE 1 TABLET BY MOUTH EVERY DAY  . lidocaine (LIDODERM) 5 % Place 1 patch onto the skin daily. Remove & Discard patch within 12 hours or as  directed by MD  . losartan (COZAAR) 100 MG tablet TAKE 1 TABLET BY MOUTH DAILY  . metFORMIN (GLUCOPHAGE) 500 MG tablet Take 1 tablet (500 mg total) by mouth daily with breakfast.  . mirtazapine (REMERON) 7.5 MG tablet Take 1 tablet (7.5 mg total) by mouth at bedtime.  [name] nystatin (MYCOSTATIN/NYSTOP) powder APPLY TO AFFECTED AREA 3 TIMES A DAY  . ondansetron (ZOFRAN) 8 MG tablet Take 1 tablet (8 mg total) by mouth every 8 (eight) hours as needed for nausea or vomiting.  . pregabalin (LYRICA) 25 MG capsule Take 1 capsule (25 mg total) by mouth daily.  . sucralfate (CARAFATE) 1 g tablet TAKE 1 TABLET (1 G TOTAL) BY MOUTH 4 (FOUR) TIMES DAILY - WITH MEALS AND AT BEDTIME.  [name] UNABLE TO FIND Take by mouth as needed. [place] HBP  . UNABLE TO FIND Take by mouth daily. CBD gummies  . Vitamin D, Ergocalciferol, (DRISDOL) 1.25 MG (50000 UNIT) CAPS capsule TAKE 1 CAPSULE (50,000 UNITS TOTAL) BY MOUTH EVERY 7 (SEVEN) DAYS   No facility-administered encounter medications on file as of 01/08/2021.    Patient Active Problem List   Diagnosis Date Noted  . Parkinson disease ([place]) 09/25/2020  . DDD (degenerative disc disease), lumbosacral 07/10/2020  . Chronic lower extremity pain (Bilateral) 07/10/2020  . Other intervertebral disc degeneration, lumbar region 07/10/2020  . Change in voice 07/02/2020  . Difficulty walking 07/02/2020  . Tremor 07/02/2020  . Thyroiditis 12/19/2019  . Chronic knee pain (Left) 07/05/2019  . Neurogenic pain 11/01/2018  . Morbid  obesity with BMI of 40.0-44.9, adult (Cynthia Jones) 09/20/2018  . Hypothyroid 09/15/2018  . Vitamin D deficiency 09/05/2018  . Elevated C-reactive protein (CRP) 08/09/2018  . Elevated sed rate 08/09/2018  . Pharmacologic therapy 08/08/2018  . Disorder of skeletal system 08/08/2018  . Problems influencing health status 08/08/2018  . Contusion of foot 03/09/2018  . Advanced care planning/counseling discussion 04/12/2017  . Lumbar spondylosis 10/07/2016  .  Hypercholesteremia 09/14/2016  . Long term prescription opiate use 09/08/2016  . Long term prescription benzodiazepine use 09/08/2016  . Chronic pain syndrome 09/08/2016  . Chronic low back pain (2ry area of Pain) (Bilateral) (L>R) 09/08/2016  . Chronic knee pain (1ry area of Pain) (Bilateral) (L>R) 09/08/2016  . Cervicogenic headache 09/08/2016  . Chronic hip pain (Bilateral) 09/08/2016  . Chronic hand pain (Bilateral) (R>L) 09/08/2016  . Chronic foot pain (Left) 09/08/2016  . Insomnia 09/17/2015  . Folliculitis 95/62/1308  . History of total knee replacement (Right) 04/17/2015  . Fibromyalgia 04/09/2015  . Bilateral occipital neuralgia 04/09/2015  . Greater trochanteric bursitis 04/09/2015  . Osteoarthritis of knee (Left) 04/09/2015  . Sacroiliac joint disease 04/09/2015  . Lumbar facet syndrome (Bilateral) (L>R) 04/09/2015  . Neuropathy due to secondary diabetes ([place]) 04/09/2015  . Osteopenia 03/06/2015  . Anxiety 03/06/2015  . Depression 03/06/2015  . Hypertensive disorder 03/06/2015  . Diabetes mellitus ([place]) 03/06/2015  . Osteoarthritis 03/06/2015  . Seasonal allergic rhinitis 03/06/2015  . Obesity 03/06/2015  . Intertrigo 03/06/2015    Conditions to be addressed/monitored:HTN, DMII, Anxiety, Depression and Chronic pain  Care Plan : RNCM: Depression (Adult)  Updates made by Vanita Ingles since 01/08/2021 12:00 AM    Problem: RNCM Anxiety and Depression Identification (Depression)   Priority: Medium    Long-Range Goal: RNCM: Anxiety and Depressive Symptoms Identified   Priority: Medium  Note:   Current Barriers:  [name] Knowledge Deficits related to management of management of anxiety and depression . Chronic Disease Management support and education needs related to unresolved anxiety and depression . Lacks caregiver support.  . Transportation barriers . Non-adherence to scheduled provider appointments . Unable to independently manage depression and anxiety- loss of  husband in death, has had a lot of change in the last couple of years. Back at the home she shared with her husband and this is helping a lot.  . Does not attend all scheduled provider appointments . Does not contact provider office for questions/concerns  Nurse Case Manager Clinical Goal(s):  [name] Over the next 120 days, patient will verbalize understanding of plan for management of depression and anxiety . Over the next 120 days, patient will attend all scheduled medical appointments: 04-14-2021 . Over the next 120 days, patient will demonstrate improved adherence to prescribed treatment plan for anxiety and depression as evidenced byexpressing feelings to the CCM team, calling the office for changes in mood/anxiety/depression, following recommendations to pcp and utilization of support systems   Interventions:  . 1:1 collaboration with Charlynne Cousins, MD regarding development and update of comprehensive plan of care as evidenced by provider attestation and co-signature . Inter-disciplinary care team collaboration (see longitudinal plan of care) . Evaluation of current treatment plan related to anxiety and depression  and patient's adherence to plan as established by provider. 11-05-2020: The patient states she is very happy with her new pcp. She is happy today because it is her birthday and her family is with her. 01-08-2021: The patient states she is doing better since the new medication changes. She states she had  been a little anxious but she is sleeping better and she is feeling better.  Denies any new concerns with depression and anxiety. . Advised patient to call the office for changes in mood/anxiety level/ or increased depressive state . Provided education to patient re: support for anxiety and depression and alternative methods such as mindfulness and relaxation to help with periods of anxiety and depression. 01-08-2021: The patient feels her anxiety and depression is better since medications.   . Discussed plans with patient for ongoing care management follow up and provided patient with direct contact information for care management team . Reviewed scheduled/upcoming provider appointments including: 04-04-2021  Patient Goals/Self-Care Activities Over the next 120 days, patient will:  - Patient will self administer medications as prescribed Patient will attend all scheduled provider appointments Patient will call pharmacy for medication refills Patient will attend church or other social activities Patient will call provider office for new concerns or questions Patient will work with BSW to address care coordination needs and will continue to work with the clinical team to address health care and disease management related needs.   - anxiety screen reviewed - depression screen reviewed - medication list reviewed  Follow Up Plan: Telephone follow up appointment with care management team member scheduled for: 04-04-2021       Care Plan : RNCM: Chronic Pain (Adult)  Updates made by Vanita Ingles since 01/08/2021 12:00 AM    Problem: RNCM: Pain Management Plan (Chronic Pain)   Priority: Medium    Long-Range Goal: RNCM: Pain Management Plan Developed   Priority: Medium  Note:   Current Barriers:  [name] Knowledge Deficits related to managing acute/chronic pain . Non-adherence to scheduled provider appointments . Non-adherence to prescribed medication regimen . Difficulty obtaining medications . Chronic Disease Management support and education needs related to chronic pain . Unable to independently manage chronic pain and discomfort  . Does not attend all scheduled provider appointments . Does not contact provider office for questions/concerns  Nurse Case Manager Clinical Goal(s):  [name] Over the next 120 days, patient will verbalize understanding of plan for managing pain . Over the next 120 days, patient will attend all scheduled medical appointments: 04-04-2021  . Over the next 120    days patient will demonstrate use of different relaxation  skills and/or diversional activities to assist with pain reduction (distraction, imagery, relaxation, massage, acupressure, TENS, heat, and cold application . Over the next 120    days patient will report pain at a level less than 3 to 4 on a 10-10 rating scale . Over the next  120  days patient will use pharmacological and nonpharmacological pain relief strategies . Over the next 120  days patient will verbalize acceptable level of pain relief and ability to engage in desired activities . Over the next  120 days patient will engage in desired activities without an increase in pain level  Interventions:  . - deep breathing, relaxation and mindfulness use promoted . - effectiveness of pharmacologic therapy monitored- 01-08-2021: Has had changes in her medications and the patient states they are working well for her. Denies any falls or pain today.   . - misuse of pain medication assessed . - motivation and barriers to change assessed and addressed . - mutually acceptable comfort goal set . - pain assessed- 04-04-2021: Denies pain at this time . - pain treatment goals reviewed . Evaluation of current treatment plan related to Chronic pain and discomfort  and patient's adherence to plan as  established by provider. . Advised patient to call the provider for worsening pain or discomfort . Provided education to patient re: alternative pain relief measure . Reviewed medications with patient and discussed compliance.  The patient states that her pain is well controlled at this time. Is still going to pain management. 04-04-2021:State that she is doing well with the medication changes  . Collaborated with CCM team and pcp regarding management of pain and discomfort effectively . Discussed plans with patient for ongoing care management follow up and provided patient with direct contact information for care management team . Allow patient to maintain a  diary of pain ratings, timing, precipitating events, medications, treatments, and what works best to relieve pain,  . Refer to support groups and self-help groups . Educate patient about the use of pharmacological interventions for pain management- antianxiety, antidepressants, NSAIDS, opioid analgesics,  . Explain the importance of lifestyle modifications to effective pain management   Patient Goals/Self Care Activities:  . Patient verbalizes understanding of plan to manage chronic pain and discomfort  . Self-administers medications as prescribed . Attends all scheduled provider appointments . Calls pharmacy for medication refills . Calls provider office for new concerns or questions . - mutually acceptable comfort goal set . - pain assessed . - pain management plan developed . - pain treatment goals reviewed . - patient response to treatment assessed . - sharing of pain management plan with teachers and other caregivers encouraged  Follow Up Plan: Telephone follow up appointment with care management team member scheduled for: 03-19-2021 at 1:00 pm     Care Plan : RNCM: Hypertension (Adult)  Updates made by Vanita Ingles since 01/08/2021 12:00 AM    Problem: RNCM: Hypertension (Hypertension)   Priority: Medium    Long-Range Goal: RNCM: Hypertension Monitored   Priority: Medium  Note:   Objective:  . Last practice recorded BP readings:  . BP Readings from Last 3 Encounters: .  01/02/21 . (!) 315/17 .  11/21/20 . 132/78 .  10/31/20 . (!) 89/57 .     [name] Most recent eGFR/CrCl: No results found for: EGFR  No components found for: CRCL Current Barriers:  [name] Knowledge Deficits related to basic understanding of hypertension pathophysiology and self care management . Knowledge Deficits related to understanding of medications prescribed for management of hypertension . Non-adherence to scheduled provider appointments . Transportation barriers . Unable to independently manage  HTN . Does not attend all scheduled provider appointments . Does not contact provider office for questions/concerns Case Manager Clinical Goal(s):  [name] Over the next 120 days, patient will verbalize understanding of plan for hypertension management . Over the next 120 days, patient will attend all scheduled medical appointments: 04-04-2021 . Over the next 120 days, patient will demonstrate improved adherence to prescribed treatment plan for hypertension as evidenced by taking all medications as prescribed, monitoring and recording blood pressure as directed, adhering to low sodium/DASH diet . Over the next 120 days, patient will demonstrate improved health management independence as evidenced by checking blood pressure as directed and notifying PCP if SBP>160 or DBP > 90, taking all medications as prescribe, and adhering to a low sodium diet as discussed. . Over the next 120 days, patient will verbalize basic understanding of hypertension disease process and self health management plan as evidenced by compliance with recommendations by the provider Interventions:  . Evaluation of current treatment plan related to hypertension self management and patient's adherence to plan as established by provider. 01-08-2021: The patient  states that she is not having issues with light headedness or dizziness, she does not have a way to check her blood pressure. Pressure was low in the pcp office last week. Review of sx/sx of hypotension.  . Provided education to patient re: stroke prevention, s/s of heart attack and stroke, DASH diet, complications of uncontrolled blood pressure . Reviewed medications with patient and discussed importance of compliance. 01-08-2021: The patient endorses compliance with medications . Discussed plans with patient for ongoing care management follow up and provided patient with direct contact information for care management team . Advised patient, providing education and rationale, to monitor  blood pressure daily and record, calling PCP for findings outside established parameters.  . Reviewed scheduled/upcoming provider appointments including: 04-04-2021 at 11 am Patient Goals/Self-Care Activities . Over the next 120 days, patient will:  - Attends all scheduled provider appointments Calls provider office for new concerns, questions, or BP outside discussed parameters Checks BP and records as discussed Follows a low sodium diet/DASH diet - depression screen reviewed - home or ambulatory blood pressure monitoring encouraged Follow Up Plan: Telephone follow up appointment with care management team member scheduled for: 03-19-2021 at 1:00 pm    Care Plan : RNCM: Diabetes Type 2 (Adult)  Updates made by Vanita Ingles since 01/08/2021 12:00 AM    Problem: RNCM: Glycemic Management (Diabetes, Type 2)   Priority: Medium    Long-Range Goal: RNCM: Glycemic Management Optimized   Priority: Medium  Note:   Objective:  . Lab Results .  Component . Value . Date .   [name] HGBA1C . 7.1 (H) . 11/14/2020 .     Lab Results  Component Value Date   CREATININE 0.89 03/01/2020   CREATININE 0.76 06/16/2019   CREATININE 0.74 03/09/2019 .   [name] No results found for: EGFR Current Barriers:  [name] Knowledge Deficits related to basic Diabetes pathophysiology and self care/management . Knowledge Deficits related to medications used for management of diabetes . Unable to independently manage DM . Does not attend all scheduled provider appointments Case Manager Clinical Goal(s):  [name] Over the next 120 days, patient will demonstrate improved adherence to prescribed treatment plan for diabetes self care/management as evidenced by:  . daily monitoring and recording of CBG  . adherence to ADA/ carb modified diet . adherence to prescribed medication regimen Interventions:  . Provided education to patient about basic DM disease process . Reviewed medications with patient and discussed importance of medication  adherence. 01-08-2021: The patient states that she has started her Metformin back. She has no way of checking her blood sugars. Will collaborate with the pharm D and pcp to see what the recommendations are for blood sugar checks.  . Discussed plans with patient for ongoing care management follow up and provided patient with direct contact information for care management team . Provided patient with written educational materials related to hypo and hyperglycemia and importance of correct treatment. 01-08-2021: The patient and the patients daughter state she does not have a glucose meter to check her blood sugars. The patient states that she feels fine but her daughter says she does not always eat well and sometimes she does notice the patient gets shaky. The patient does drink a Glucerna if she doesn't eat a meal. Education on staying hydrated and eating small meals. Will continue to monitor and educate appropriately.   . Reviewed scheduled/upcoming provider appointments including: 04-04-2021 at 11 am . Advised patient, providing education and rationale, to check cbg BID and  record, calling pcp for findings outside established parameters.  01-08-2021: Will discuss with the pcp about getting the patient a script for a glucose meter.  . Review of patient status, including review of consultants reports, relevant laboratory and other test results, and medications completed. Patient Goals/Self-Care Activities . Over the next 120 days, patient will:  - UNABLE to independently manage DM as evidence of hemoglobin A1C 7.1 Self administers oral medications as prescribed Attends all scheduled provider appointments Checks blood sugars as prescribed and utilize hyper and hypoglycemia protocol as needed Adheres to prescribed ADA/carb modified - barriers to adherence to treatment plan identified - blood glucose monitoring encouraged - individualized medical nutrition therapy provided - resources required to improve  adherence to care identified - self-awareness of signs/symptoms of hypo or hyperglycemia encouraged - use of blood glucose monitoring log promoted Follow Up Plan: Telephone follow up appointment with care management team member scheduled for: 03-19-2021 at 1:00 pm  riority:  Medium Start Date:                             Expected End Date:     11-05-2021                  Follow Up Date 03-19-2021 at 1 pm  01-08-2021: The patients daughter states that the patient does not have any way of checking her blood sugars. Will collaborate with pcp and pharm D about the patient getting a meter to check blood sugars.  Review of hypoglycemia and hyperglycemia.  The patient sometimes does not eat well and the daughter was concerned. Education and support given.    - check blood sugar at prescribed times - check blood sugar before and after exercise - check blood sugar if I feel it is too high or too low - enter blood sugar readings and medication or insulin into daily log    Why is this important?    Checking your blood sugar at home helps to keep it from getting very high or very low.   Writing the results in a diary or log helps the doctor know how to care for you.   Your blood sugar log should have the time, date and the results.   Also, write down the amount of insulin or other medicine that you take.   Other information, like what you ate, exercise done and how you were feeling, will also be helpful.          Plan:Telephone follow up appointment with care management team member scheduled for:  03-19-2021 at 1 pm  [name] RN, MSN, [facility] Mobile: [phone]

## 2021-01-08 NOTE — Patient Instructions (Signed)
Visit Information  PATIENT GOALS: Goals Addressed            This Visit's Progress   . RNCM: Monitor and Manage My Blood Sugar-Diabetes Type 2       Timeframe:  Long-Range Goal Priority:  Medium Start Date:                             Expected End Date:     11-05-2021                  Follow Up Date 03-19-2021 at 1 pm  01-08-2021: The patients daughter states that the patient does not have any way of checking her blood sugars. Will collaborate with pcp and pharm D about the patient getting a meter to check blood sugars.  Review of hypoglycemia and hyperglycemia.  The patient sometimes does not eat well and the daughter was concerned. Education and support given.    - check blood sugar at prescribed times - check blood sugar before and after exercise - check blood sugar if I feel it is too high or too low - enter blood sugar readings and medication or insulin into daily log    Why is this important?    Checking your blood sugar at home helps to keep it from getting very high or very low.   Writing the results in a diary or log helps the doctor know how to care for you.   Your blood sugar log should have the time, date and the results.   Also, write down the amount of insulin or other medicine that you take.   Other information, like what you ate, exercise done and how you were feeling, will also be helpful.            Patient verbalizes understanding of instructions provided today and agrees to view in MyChart.   Telephone follow up appointment with care management team member scheduled for: 03-19-2021 at 1 pm  Alto Denver RN, MSN, CCM Community Care Coordinator [place]  [facility] Mobile: [phone]

## 2021-01-13 ENCOUNTER — Other Ambulatory Visit: Payer: Self-pay | Admitting: Internal Medicine

## 2021-01-13 NOTE — Telephone Encounter (Signed)
Requested medication (s) are due for refill today: yes  Requested medication (s) are on the active medication list: yes  Last refill:  12/24/2020  Future visit scheduled: yes  Notes to clinic:  this refill cannot be delegated    Requested Prescriptions  Pending Prescriptions Disp Refills   Vitamin D, Ergocalciferol, (DRISDOL) 1.25 MG (50000 UNIT) CAPS capsule [Pharmacy Med Name: VITAMIN D2 1.25MG (50,000 UNIT)] 3 capsule 0    Sig: TAKE 1 CAPSULE (50,000 UNITS TOTAL) BY MOUTH EVERY 7 (SEVEN) DAYS      Endocrinology:  Vitamins - Vitamin D Supplementation Failed - 01/13/2021  1:26 PM      Failed - 50,000 IU strengths are not delegated      Failed - Phosphate in normal range and within 360 days    No results found for: PHOS        Failed - Vitamin D in normal range and within 360 days    25-Hydroxy, Vitamin D-3  Date Value Ref Range Status  08/08/2018 20 ng/mL Final   25-Hydroxy, Vitamin D-2  Date Value Ref Range Status  08/08/2018 <1.0 ng/mL Final   25-Hydroxy, Vitamin D  Date Value Ref Range Status  08/08/2018 20 (L) ng/mL Final    Comment:    Reference Range: All Ages: Target levels 30 - 100           Passed - Ca in normal range and within 360 days    Calcium  Date Value Ref Range Status  03/01/2020 9.4 8.7 - 10.3 mg/dL Final          Passed - Valid encounter within last 12 months    Recent Outpatient Visits           1 week ago Difficulty walking   [facility] Vigg, Avanti, MD   1 month ago Dysphagia, unspecified type   [facility] Vigg, Avanti, MD   2 months ago Arthritis   [facility] Vigg, Avanti, MD   4 months ago Suspected COVID-19 virus infection   [employer], [place], DO   5 months ago Appointment canceled by hospital   [facility] Dorcas Carrow, DO       Future Appointments             In 2 months Vigg, Avanti, MD [facility]

## 2021-01-13 NOTE — Telephone Encounter (Signed)
Pt has apt on 03/28/2021 for lab then 04/04/2021 for apt with pcp

## 2021-01-14 ENCOUNTER — Encounter: Payer: Self-pay | Admitting: Internal Medicine

## 2021-01-15 ENCOUNTER — Encounter: Payer: Self-pay | Admitting: Nurse Practitioner

## 2021-01-15 ENCOUNTER — Other Ambulatory Visit: Payer: Self-pay

## 2021-01-15 ENCOUNTER — Other Ambulatory Visit: Payer: Medicare HMO | Admitting: Nurse Practitioner

## 2021-01-15 VITALS — HR 78 | Resp 18

## 2021-01-15 DIAGNOSIS — R454 Irritability and anger: Secondary | ICD-10-CM

## 2021-01-15 DIAGNOSIS — Z515 Encounter for palliative care: Secondary | ICD-10-CM | POA: Diagnosis not present

## 2021-01-15 NOTE — Progress Notes (Signed)
[facility] Palliative Care Consult Note Telephone: [phone]  Fax: [phone]    Date of encounter: 01/15/21 PATIENT NAME: Cynthia Jones [id number]   [phone] (home)  DOB: [birth year] MRN: [id number] PRIMARY CARE PROVIDER:    Charlynne Cousins, MD,  [place] [id number] [phone]  RESPONSIBLE PARTY:    Contact Information    Name Relation Home Work Mobile   [facility] Granddaughter [phone]  [place] Son [phone]     Lanae Crumbly Daughter   [phone]     I met face to face with patient and family in home. Palliative Care was asked to follow this patient by consultation request of  Vigg, Avanti, MD to address advance care planning and complex medical decision making. This is a follow up visit. ASSESSMENT AND PLAN / RECOMMENDATIONS:   Symptom Management/Plan: 1.Advance Care Planning;DNR  2. Goals of Care: Goals include to maximize quality of life and symptom management. Our advance care planning conversation included a discussion about:   The value and importance of advance care planning  Exploration of personal, cultural or spiritual beliefs that might influence medical decisions  Exploration of goals of care in the event of a sudden injury or illness  Identification and preparation of a healthcare agent  Review and updating or creation of anadvance directive document.  3.Palliative care encounter; Palliative care encounter; Palliative medicine team will continue to support patient, patient's family, and medical team. Visit consisted of counseling and education dealing with the complex and emotionally intense issues of symptom management and palliative care in the setting of serious and potentially life-threatening illness  4. Irritability, possible multifactorial with decrease appetite, DM with possible low/high blood sugars, beginnings  sundowners, discussed at length with Ms. Shresta, [place] daughter and granddaughter Mateo Flow will continue to monitor symptoms, keep a log with time of day, dates to see if can identify a pattern. We talked about checking blood sugars at that time, offering snack with protein. We talked about encourage mobility, self care. We talked about possibility of sundowners, we talked about activities Ms. Mcevoy can do when she feels irritated. Ms. Byer is aware of the feeling, we talked about coping strategies to defuse it. Continue remeron at bedtime which has been improving  Follow up Palliative Care Visit: Palliative care will continue to follow for complex medical decision making, advance care planning, and clarification of goals. Return 8 weeks or prn.  I spent 60 minutes providing this consultation. More than 50% of the time in this consultation was spent in counseling and care coordination.  PPS: 40%  HOSPICE ELIGIBILITY/DIAGNOSIS: TBD  Chief Complaint: Follow-up Palliative consult for complex medical decision making  HISTORY OF PRESENT ILLNESS:  RENIYA MCCLEES is a 80 y.o. year old female  with multiple medical problems including cva, depression, DM, HTN, obesity, OA, RA, fibromyalgia, tremors.  Ms. Eastlick was seen by Dr Melrose Nakayama, Neurology for tremors which she was started on carbidopa-levodopa and increase dosage at visit on 12/24/2020 to sinemet 25-11m take 1 tablet qid. I called Ms. JDickermanto confirm PC f/u visit and covid screening which ws negative. I visited Ms. JBroomein her home, she was sitting in the recliner with her feet up with SIvin Bootyher daughter, VMateo Flowher granddaughter were present. We talked about purpose of PC visit. We talked about how Cynthia Jones has been feeling. We talked about last Neurology visit with Dr PMelrose Nakayamawhere carbidopa-levodopa was increased  with improvement of tremors and overall functionally. Mateo Flow endorses improvements in tremors where she is  able to feed herself now. Walking has improved. Continue to have to help Ms. Sparano out of the chair to a standing position but overall functionally much improved. Cynthia Jones is now able to help with her bath, dress herself. Appetite remains declined. We talked about weight loss, then has started to regain the weight. We talked about nutrition. Ms. Treu talked about getting irritable the same time every day in the late afternoon. We talked about her meals, looking for a pattern. She continues to take metformin. Recommended taking her blood sugar at that time to see if correlation. We talked about keeping a food log. We talked about addition of remeron which has seemed to help. Discussed at that time try to offer a protein snack. We talked about where Ms Jones eats which is usually in the recliner. Mateo Flow endorses it is very hard for Cynthia Jones to eat at the table which is where her husband who has passed used to sit. Mateo Flow endorses emotionally it creates pain for her to sit there. We talked about meals being a social event. Ms. Mceachron does do better with eating should someone sit down and eat with her. We talked about importance of feeling included. We talked about sleep patterns. We talked about possibility of Parkinson. Cynthia Jones endorses she was told she had Parkinson, her father did also whom she cared for. Praised Ms Jones for self care, functional improvements. Medical goals reviewed. We talked about living situation, family dynamics which has improved. Cynthia Jones appears very content, smiling, laughing throughout PC visit. Cynthia Jones endorses she has been working on her computer tablet, doing word searches. Cynthia Jones endorses she is trying to make her mind stronger. We talked about quality of life. We talked about role PC in poc. Discussed f/u visit, Cynthia Jones in agreement, scheduled. Therapeutic listening, emotional support provided.   History obtained from  review of EMR, discussion with interview with family, Ivin Booty daughter, Mateo Flow granddaughter (who is a Therapist, sports) and Ms. Carone.  I reviewed available labs, medications, imaging, studies and related documents from the EMR.  Records reviewed and summarized above.   ROS Full 14 system review of systems performed and negative with exception of: as per HPI.  Physical Exam: Constitutional: NAD General: obese, pleasant female EYES: lids intact ENMT: oral mucous membranes moist CV: S1S2, RRR Pulmonary: LCTA, no increased work of breathing, no cough, room air Abdomen:  normo-active BS + 4 quadrants, soft and non tender GU: deferred MSK:  ambulatory with walker Skin: warm and dry, no rashes or wounds on visible skin Neuro:  + generalized weakness,  mild cognitive impairment Psych: non-anxious affect, A and O x 3  Questions and concerns were addressed. The patient/family was encouraged to call with questions and/or concerns. My business card was provided. Provided general support and encouragement, no other unmet needs identified  Thank you for the opportunity to participate in the care of Ms. Fredericks.  The palliative care team will continue to follow. Please call our office at [phone] if we can be of additional assistance.   This chart was dictated using voice recognition software. Despite best efforts to proofread, errors can occur which can change the documentation meaning.   Jamine Wingate Z Copper Basnett, NP , MSN, ACHPN  COVID-19 PATIENT SCREENING TOOL Asked and negative response unless otherwise noted:   Have you had symptoms of covid, tested positive or been in contact with  someone with symptoms/positive test in the past 5-10 days? NO

## 2021-01-17 ENCOUNTER — Telehealth: Payer: Self-pay

## 2021-01-17 NOTE — Chronic Care Management (AMB) (Signed)
  Chronic Care Management   Note  01/17/2021 Name: RHYA SHAN MRN: [id number] DOB: 01-12-1941  TACIE MCCUISTION is a 80 y.o. year old female who is a primary care patient of Vigg, Avanti, MD. NIASHA DEVINS is currently enrolled in care management services. An additional referral for Pharm D  was placed.   Follow up plan: Telephone appointment with care management team member scheduled for:02/10/2021  Penne Lash, RMA Care Guide, Embedded Care Coordination [facility]  [place], [place] [address] Direct Dial: [phone] [email or web address]: [email or web address]

## 2021-01-26 ENCOUNTER — Other Ambulatory Visit: Payer: Self-pay | Admitting: Internal Medicine

## 2021-01-26 NOTE — Telephone Encounter (Signed)
Requested Prescriptions  Pending Prescriptions Disp Refills  . mirtazapine (REMERON) 7.5 MG tablet [Pharmacy Med Name: MIRTAZAPINE 7.5 MG TABLET] 90 tablet 0    Sig: TAKE 1 TABLET BY MOUTH AT BEDTIME.     Psychiatry: Antidepressants - mirtazapine Failed - 01/26/2021 11:30 AM      Failed - WBC in normal range and within 360 days    WBC  Date Value Ref Range Status  10/27/2019 8.4 3.4 - 10.8 x10E3/uL Final  11/20/2018 10.2 4.0 - 10.5 K/uL Final         Passed - AST in normal range and within 360 days    AST  Date Value Ref Range Status  03/01/2020 17 0 - 40 IU/L Final   AST (SGOT) Piccolo, Waived  Date Value Ref Range Status  10/13/2017 20 11 - 38 U/L Final         Passed - ALT in normal range and within 360 days    ALT  Date Value Ref Range Status  03/01/2020 15 0 - 32 IU/L Final   ALT (SGPT) Piccolo, Waived  Date Value Ref Range Status  10/13/2017 24 10 - 47 U/L Final         Passed - Triglycerides in normal range and within 360 days    Triglycerides  Date Value Ref Range Status  11/14/2020 75 0 - 149 mg/dL Final   Triglycerides Piccolo,Waived  Date Value Ref Range Status  10/13/2017 129 <150 mg/dL Final    Comment:                            Normal                   <150                         Borderline High     150 - 199                         High                200 - 499                         Very High                >499          Passed - Total Cholesterol in normal range and within 360 days    Cholesterol, Total  Date Value Ref Range Status  11/14/2020 167 100 - 199 mg/dL Final   Cholesterol Piccolo, Waived  Date Value Ref Range Status  10/13/2017 203 (H) <200 mg/dL Final    Comment:                            Desirable                <200                         Borderline High      200- 239                         High                     >  239          Passed - Completed PHQ-2 or PHQ-9 in the last 360 days      Passed - Valid encounter  within last 6 months    Recent Outpatient Visits          3 weeks ago Difficulty walking   [facility] Vigg, Avanti, MD   2 months ago Dysphagia, unspecified type   [facility] Vigg, Avanti, MD   2 months ago Arthritis   [facility] Vigg, Avanti, MD   4 months ago Suspected COVID-19 virus infection   [employer], [place], DO   5 months ago Appointment canceled by hospital   [facility] Dorcas Carrow, DO      Future Appointments            In 2 months Vigg, Avanti, MD [facility]

## 2021-01-27 ENCOUNTER — Emergency Department: Payer: Medicare HMO

## 2021-01-27 ENCOUNTER — Encounter: Payer: Self-pay | Admitting: Radiology

## 2021-01-27 ENCOUNTER — Inpatient Hospital Stay
Admission: EM | Admit: 2021-01-27 | Discharge: 2021-02-02 | DRG: 872 | Disposition: A | Discharge status: Home / Self Care | Payer: Medicare HMO | Attending: Internal Medicine | Admitting: Internal Medicine

## 2021-01-27 DIAGNOSIS — E871 Hypo-osmolality and hyponatremia: Secondary | ICD-10-CM | POA: Diagnosis present

## 2021-01-27 DIAGNOSIS — R531 Weakness: Secondary | ICD-10-CM

## 2021-01-27 DIAGNOSIS — Z9049 Acquired absence of other specified parts of digestive tract: Secondary | ICD-10-CM | POA: Diagnosis not present

## 2021-01-27 DIAGNOSIS — E1140 Type 2 diabetes mellitus with diabetic neuropathy, unspecified: Secondary | ICD-10-CM | POA: Diagnosis present

## 2021-01-27 DIAGNOSIS — Z823 Family history of stroke: Secondary | ICD-10-CM

## 2021-01-27 DIAGNOSIS — F32A Depression, unspecified: Secondary | ICD-10-CM | POA: Diagnosis present

## 2021-01-27 DIAGNOSIS — R509 Fever, unspecified: Secondary | ICD-10-CM | POA: Diagnosis not present

## 2021-01-27 DIAGNOSIS — B9620 Unspecified Escherichia coli [E. coli] as the cause of diseases classified elsewhere: Secondary | ICD-10-CM | POA: Diagnosis present

## 2021-01-27 DIAGNOSIS — J3489 Other specified disorders of nose and nasal sinuses: Secondary | ICD-10-CM | POA: Diagnosis not present

## 2021-01-27 DIAGNOSIS — R410 Disorientation, unspecified: Secondary | ICD-10-CM | POA: Diagnosis not present

## 2021-01-27 DIAGNOSIS — E1169 Type 2 diabetes mellitus with other specified complication: Secondary | ICD-10-CM | POA: Diagnosis present

## 2021-01-27 DIAGNOSIS — Z820 Family history of epilepsy and other diseases of the nervous system: Secondary | ICD-10-CM

## 2021-01-27 DIAGNOSIS — K5720 Diverticulitis of large intestine with perforation and abscess without bleeding: Secondary | ICD-10-CM | POA: Diagnosis present

## 2021-01-27 DIAGNOSIS — Z886 Allergy status to analgesic agent status: Secondary | ICD-10-CM

## 2021-01-27 DIAGNOSIS — R0902 Hypoxemia: Secondary | ICD-10-CM | POA: Diagnosis not present

## 2021-01-27 DIAGNOSIS — Z20822 Contact with and (suspected) exposure to covid-19: Secondary | ICD-10-CM | POA: Diagnosis present

## 2021-01-27 DIAGNOSIS — E039 Hypothyroidism, unspecified: Secondary | ICD-10-CM | POA: Diagnosis present

## 2021-01-27 DIAGNOSIS — Z809 Family history of malignant neoplasm, unspecified: Secondary | ICD-10-CM

## 2021-01-27 DIAGNOSIS — E876 Hypokalemia: Secondary | ICD-10-CM | POA: Diagnosis not present

## 2021-01-27 DIAGNOSIS — M8949 Other hypertrophic osteoarthropathy, multiple sites: Secondary | ICD-10-CM | POA: Diagnosis present

## 2021-01-27 DIAGNOSIS — Z79899 Other long term (current) drug therapy: Secondary | ICD-10-CM

## 2021-01-27 DIAGNOSIS — Z888 Allergy status to other drugs, medicaments and biological substances status: Secondary | ICD-10-CM

## 2021-01-27 DIAGNOSIS — M1990 Unspecified osteoarthritis, unspecified site: Secondary | ICD-10-CM | POA: Diagnosis present

## 2021-01-27 DIAGNOSIS — E785 Hyperlipidemia, unspecified: Secondary | ICD-10-CM | POA: Diagnosis present

## 2021-01-27 DIAGNOSIS — E1159 Type 2 diabetes mellitus with other circulatory complications: Secondary | ICD-10-CM

## 2021-01-27 DIAGNOSIS — A419 Sepsis, unspecified organism: Principal | ICD-10-CM | POA: Diagnosis present

## 2021-01-27 DIAGNOSIS — Z66 Do not resuscitate: Secondary | ICD-10-CM | POA: Diagnosis present

## 2021-01-27 DIAGNOSIS — Z8249 Family history of ischemic heart disease and other diseases of the circulatory system: Secondary | ICD-10-CM

## 2021-01-27 DIAGNOSIS — F0280 Dementia in other diseases classified elsewhere without behavioral disturbance: Secondary | ICD-10-CM | POA: Diagnosis present

## 2021-01-27 DIAGNOSIS — Z90710 Acquired absence of both cervix and uterus: Secondary | ICD-10-CM

## 2021-01-27 DIAGNOSIS — M159 Polyosteoarthritis, unspecified: Secondary | ICD-10-CM | POA: Diagnosis present

## 2021-01-27 DIAGNOSIS — E119 Type 2 diabetes mellitus without complications: Secondary | ICD-10-CM

## 2021-01-27 DIAGNOSIS — G20 Parkinson's disease: Secondary | ICD-10-CM | POA: Diagnosis present

## 2021-01-27 DIAGNOSIS — Z833 Family history of diabetes mellitus: Secondary | ICD-10-CM | POA: Diagnosis not present

## 2021-01-27 DIAGNOSIS — S0990XA Unspecified injury of head, initial encounter: Secondary | ICD-10-CM | POA: Diagnosis not present

## 2021-01-27 DIAGNOSIS — R0689 Other abnormalities of breathing: Secondary | ICD-10-CM | POA: Diagnosis not present

## 2021-01-27 DIAGNOSIS — Z7984 Long term (current) use of oral hypoglycemic drugs: Secondary | ICD-10-CM

## 2021-01-27 DIAGNOSIS — K651 Peritoneal abscess: Secondary | ICD-10-CM

## 2021-01-27 DIAGNOSIS — G319 Degenerative disease of nervous system, unspecified: Secondary | ICD-10-CM | POA: Diagnosis not present

## 2021-01-27 DIAGNOSIS — W19XXXA Unspecified fall, initial encounter: Secondary | ICD-10-CM

## 2021-01-27 DIAGNOSIS — Z6841 Body Mass Index (BMI) 40.0 and over, adult: Secondary | ICD-10-CM

## 2021-01-27 DIAGNOSIS — M48061 Spinal stenosis, lumbar region without neurogenic claudication: Secondary | ICD-10-CM | POA: Diagnosis not present

## 2021-01-27 DIAGNOSIS — I152 Hypertension secondary to endocrine disorders: Secondary | ICD-10-CM | POA: Diagnosis present

## 2021-01-27 DIAGNOSIS — R300 Dysuria: Secondary | ICD-10-CM | POA: Diagnosis present

## 2021-01-27 DIAGNOSIS — F419 Anxiety disorder, unspecified: Secondary | ICD-10-CM | POA: Diagnosis present

## 2021-01-27 DIAGNOSIS — R109 Unspecified abdominal pain: Secondary | ICD-10-CM | POA: Diagnosis not present

## 2021-01-27 DIAGNOSIS — Z79890 Hormone replacement therapy (postmenopausal): Secondary | ICD-10-CM

## 2021-01-27 DIAGNOSIS — T782XXA Anaphylactic shock, unspecified, initial encounter: Secondary | ICD-10-CM | POA: Diagnosis not present

## 2021-01-27 DIAGNOSIS — R52 Pain, unspecified: Secondary | ICD-10-CM | POA: Diagnosis not present

## 2021-01-27 DIAGNOSIS — M797 Fibromyalgia: Secondary | ICD-10-CM | POA: Diagnosis present

## 2021-01-27 DIAGNOSIS — I10 Essential (primary) hypertension: Secondary | ICD-10-CM | POA: Diagnosis not present

## 2021-01-27 DIAGNOSIS — K429 Umbilical hernia without obstruction or gangrene: Secondary | ICD-10-CM | POA: Diagnosis not present

## 2021-01-27 DIAGNOSIS — K5732 Diverticulitis of large intestine without perforation or abscess without bleeding: Secondary | ICD-10-CM | POA: Diagnosis not present

## 2021-01-27 DIAGNOSIS — E1340 Other specified diabetes mellitus with diabetic neuropathy, unspecified: Secondary | ICD-10-CM | POA: Diagnosis present

## 2021-01-27 DIAGNOSIS — I6529 Occlusion and stenosis of unspecified carotid artery: Secondary | ICD-10-CM | POA: Diagnosis not present

## 2021-01-27 LAB — CBC WITH DIFFERENTIAL/PLATELET
Abs Immature Granulocytes: 0.13 10*3/uL — ABNORMAL HIGH (ref 0.00–0.07)
Basophils Absolute: 0.1 10*3/uL (ref 0.0–0.1)
Basophils Relative: 0 %
Eosinophils Absolute: 0 10*3/uL (ref 0.0–0.5)
Eosinophils Relative: 0 %
HCT: 38.7 % (ref 36.0–46.0)
Hemoglobin: 13.4 g/dL (ref 12.0–15.0)
Immature Granulocytes: 1 %
Lymphocytes Relative: 7 %
Lymphs Abs: 1.6 10*3/uL (ref 0.7–4.0)
MCH: 31.3 pg (ref 26.0–34.0)
MCHC: 34.6 g/dL (ref 30.0–36.0)
MCV: 90.4 fL (ref 80.0–100.0)
Monocytes Absolute: 1.8 10*3/uL — ABNORMAL HIGH (ref 0.1–1.0)
Monocytes Relative: 9 %
Neutro Abs: 17.6 10*3/uL — ABNORMAL HIGH (ref 1.7–7.7)
Neutrophils Relative %: 83 %
Platelets: 275 10*3/uL (ref 150–400)
RBC: 4.28 MIL/uL (ref 3.87–5.11)
RDW: 14.7 % (ref 11.5–15.5)
WBC: 21.2 10*3/uL — ABNORMAL HIGH (ref 4.0–10.5)
nRBC: 0 % (ref 0.0–0.2)

## 2021-01-27 LAB — COMPREHENSIVE METABOLIC PANEL
ALT: 6 U/L (ref 0–44)
AST: 20 U/L (ref 15–41)
Albumin: 3.4 g/dL — ABNORMAL LOW (ref 3.5–5.0)
Alkaline Phosphatase: 42 U/L (ref 38–126)
Anion gap: 14 (ref 5–15)
BUN: 9 mg/dL (ref 8–23)
CO2: 20 mmol/L — ABNORMAL LOW (ref 22–32)
Calcium: 8.7 mg/dL — ABNORMAL LOW (ref 8.9–10.3)
Chloride: 99 mmol/L (ref 98–111)
Creatinine, Ser: 0.76 mg/dL (ref 0.44–1.00)
GFR, Estimated: >60 mL/min (ref >60)
Glucose, Bld: 164 mg/dL — ABNORMAL HIGH (ref 70–99)
Potassium: 3.4 mmol/L — ABNORMAL LOW (ref 3.5–5.1)
Sodium: 133 mmol/L — ABNORMAL LOW (ref 135–145)
Total Bilirubin: 1 mg/dL (ref 0.3–1.2)
Total Protein: 7.6 g/dL (ref 6.5–8.1)

## 2021-01-27 LAB — TROPONIN I (HIGH SENSITIVITY): Troponin I (High Sensitivity): 9 ng/L (ref <18)

## 2021-01-27 LAB — LACTIC ACID, PLASMA
Lactic Acid, Venous: 1.3 mmol/L (ref 0.5–1.9)
Lactic Acid, Venous: 1.3 mmol/L (ref 0.5–1.9)

## 2021-01-27 LAB — PROTIME-INR
INR: 1.1 (ref 0.8–1.2)
Prothrombin Time: 14.4 seconds (ref 11.4–15.2)

## 2021-01-27 LAB — APTT: aPTT: 45 seconds — ABNORMAL HIGH (ref 24–36)

## 2021-01-27 MED ORDER — ACETAMINOPHEN 500 MG PO TABS
1000.0000 mg | ORAL_TABLET | Freq: Once | ORAL | Status: AC
  Administered 2021-01-28: 1000 mg via ORAL
  Filled 2021-01-27: qty 2

## 2021-01-27 MED ORDER — SODIUM CHLORIDE 0.9 % IV SOLN
2.0000 g | Freq: Once | INTRAVENOUS | Status: DC
  Administered 2021-01-27: 2 g via INTRAVENOUS
  Filled 2021-01-27: qty 2

## 2021-01-27 MED ORDER — LACTATED RINGERS IV BOLUS (SEPSIS): 500.0000 mL | Freq: Once | INTRAVENOUS | Status: DC

## 2021-01-27 MED ORDER — LACTATED RINGERS IV BOLUS (SEPSIS): 1000.0000 mL | Freq: Once | INTRAVENOUS | Status: DC

## 2021-01-27 MED ORDER — METRONIDAZOLE 500 MG/100ML IV SOLN
500.0000 mg | Freq: Once | INTRAVENOUS | Status: DC
  Administered 2021-01-27: 500 mg via INTRAVENOUS
  Filled 2021-01-27: qty 100

## 2021-01-27 MED ORDER — VANCOMYCIN HCL IN DEXTROSE 1-5 GM/200ML-% IV SOLN: 1000.0000 mg | Freq: Once | INTRAVENOUS | Status: DC

## 2021-01-27 MED ORDER — VANCOMYCIN HCL 2000 MG/400ML IV SOLN
2000.0000 mg | Freq: Once | INTRAVENOUS | Status: DC
Start: 2021-01-27 — End: 2021-01-28
  Administered 2021-01-28: 2000 mg via INTRAVENOUS
  Filled 2021-01-27: qty 400

## 2021-01-27 MED ORDER — LACTATED RINGERS IV BOLUS (SEPSIS)
1000.0000 mL | Freq: Once | INTRAVENOUS | Status: AC
  Administered 2021-01-27: 1000 mL via INTRAVENOUS

## 2021-01-27 NOTE — ED Provider Notes (Signed)
[facility] Emergency Department Provider Note   ____________________________________________   Event Date/Time   First MD Initiated Contact with Patient 01/27/21 2138     (approximate)  I have reviewed the triage vital signs and the nursing notes.   HISTORY  Chief Complaint Weakness (Possible urosepsis/)    HPI Cynthia Jones is a 80 y.o. female with past medical history of hypertension, hyperlipidemia, diabetes, chronic pain syndrome, and dementia who presents to the ED for weakness.  EMS states that patient slipped and fell out of her chair yesterday, is not sure whether she hit her head but denies losing consciousness.  She has been feeling very weak since then with inability to walk, family eventually called EMS earlier this evening.  Patient was noted to be febrile with EMS, now also endorses dysuria.  She denies any cough, chest pain, shortness of breath, abdominal pain, nausea, vomiting, or diarrhea.  She denies any pain following her fall states that she only slid onto her bottom.        Past Medical History:  Diagnosis Date  . Acute anxiety 03/06/2015  . Allergy   . Anxiety   . Degenerative joint disease of knee   . Depression   . Fibromyalgia   . Obesity   . Osteopenia   . RAD (reactive airway disease)     Patient Active Problem List   Diagnosis Date Noted  . Sepsis (HCC) 01/27/2021  . Hyperlipidemia associated with type 2 diabetes mellitus (HCC) 01/27/2021  . Parkinson disease (HCC) 09/25/2020  . DDD (degenerative disc disease), lumbosacral 07/10/2020  . Chronic lower extremity pain (Bilateral) 07/10/2020  . Other intervertebral disc degeneration, lumbar region 07/10/2020  . Change in voice 07/02/2020  . Difficulty walking 07/02/2020  . Tremor 07/02/2020  . Thyroiditis 12/19/2019  . Chronic knee pain (Left) 07/05/2019  . Neurogenic pain 11/01/2018  . Morbid obesity with BMI of 40.0-44.9, adult (HCC) 09/20/2018  .  Hypothyroid 09/15/2018  . Vitamin D deficiency 09/05/2018  . Elevated C-reactive protein (CRP) 08/09/2018  . Elevated sed rate 08/09/2018  . Pharmacologic therapy 08/08/2018  . Disorder of skeletal system 08/08/2018  . Problems influencing health status 08/08/2018  . Contusion of foot 03/09/2018  . Advanced care planning/counseling discussion 04/12/2017  . Lumbar spondylosis 10/07/2016  . Hypercholesteremia 09/14/2016  . Long term prescription opiate use 09/08/2016  . Long term prescription benzodiazepine use 09/08/2016  . Chronic pain syndrome 09/08/2016  . Chronic low back pain (2ry area of Pain) (Bilateral) (L>R) 09/08/2016  . Chronic knee pain (1ry area of Pain) (Bilateral) (L>R) 09/08/2016  . Cervicogenic headache 09/08/2016  . Chronic hip pain (Bilateral) 09/08/2016  . Chronic hand pain (Bilateral) (R>L) 09/08/2016  . Chronic foot pain (Left) 09/08/2016  . Insomnia 09/17/2015  . Folliculitis 08/05/2015  . History of total knee replacement (Right) 04/17/2015  . Fibromyalgia 04/09/2015  . Bilateral occipital neuralgia 04/09/2015  . Greater trochanteric bursitis 04/09/2015  . Osteoarthritis of knee (Left) 04/09/2015  . Sacroiliac joint disease 04/09/2015  . Lumbar facet syndrome (Bilateral) (L>R) 04/09/2015  . Neuropathy due to secondary diabetes (HCC) 04/09/2015  . Osteopenia 03/06/2015  . Anxiety 03/06/2015  . Depression 03/06/2015  . Hypertension associated with diabetes (HCC) 03/06/2015  . Diabetes mellitus (HCC) 03/06/2015  . Osteoarthritis 03/06/2015  . Seasonal allergic rhinitis 03/06/2015  . Obesity 03/06/2015  . Intertrigo 03/06/2015    Past Surgical History:  Procedure Laterality Date  . ABDOMINAL HYSTERECTOMY    . APPENDECTOMY    .  BREAST CYST ASPIRATION Right 04/18/2013   neg  . CHOLECYSTECTOMY    . EYE SURGERY    . JOINT REPLACEMENT  2014   right knee  . PAROTIDECTOMY Left   . TONSILLECTOMY      Prior to Admission medications   Medication Sig  Start Date End Date Taking? Authorizing Provider  b complex vitamins tablet Take 1 tablet by mouth daily.    [provider]  carbidopa-levodopa (SINEMET IR) 25-100 MG tablet Take 1 tablet by mouth 4 (four) times daily. 07/04/20   [provider]  carvedilol (COREG) 6.25 MG tablet TAKE 1 TABLET BY MOUTH TWICE A DAY WITH A MEAL 07/30/20   Johnson, Megan P, DO  diclofenac Sodium (VOLTAREN) 1 % GEL Apply 2 g topically as needed.  01/14/20   [provider]  esomeprazole (NEXIUM) 40 MG capsule Take 40 mg by mouth as needed.     [provider]  levothyroxine (SYNTHROID) 50 MCG tablet TAKE 1 TABLET BY MOUTH EVERY DAY 09/13/20   Cannady, Jolene T, NP  lidocaine (LIDODERM) 5 % Place 1 patch onto the skin daily. Remove & Discard patch within 12 hours or as directed by MD 10/31/20   Loura Pardon, MD  losartan (COZAAR) 100 MG tablet TAKE 1 TABLET BY MOUTH DAILY 11/23/20   Vigg, Avanti, MD  metFORMIN (GLUCOPHAGE) 500 MG tablet Take 1 tablet (500 mg total) by mouth daily with breakfast. 01/02/21   Vigg, Avanti, MD  mirtazapine (REMERON) 7.5 MG tablet TAKE 1 TABLET BY MOUTH AT BEDTIME. 01/26/21   Vigg, Avanti, MD  nystatin (MYCOSTATIN/NYSTOP) powder APPLY TO AFFECTED AREA 3 TIMES A DAY 12/24/20   Vigg, Avanti, MD  ondansetron (ZOFRAN) 8 MG tablet Take 1 tablet (8 mg total) by mouth every 8 (eight) hours as needed for nausea or vomiting. 11/21/20   Vigg, Avanti, MD  pregabalin (LYRICA) 25 MG capsule Take 1 capsule (25 mg total) by mouth daily. 11/28/20   Vigg, Avanti, MD  sucralfate (CARAFATE) 1 g tablet TAKE 1 TABLET (1 G TOTAL) BY MOUTH 4 (FOUR) TIMES DAILY - WITH MEALS AND AT BEDTIME. 11/23/20   Johnson, Megan P, DO  UNABLE TO FIND Take by mouth as needed. Coriciden HBP    [provider]  UNABLE TO FIND Take by mouth daily. CBD gummies    [provider]  Vitamin D, Ergocalciferol, (DRISDOL) 1.25 MG (50000 UNIT) CAPS capsule TAKE 1 CAPSULE (50,000 UNITS TOTAL) BY MOUTH  EVERY 7 (SEVEN) DAYS 01/14/21   Vigg, Avanti, MD    Allergies Nsaids and Erythromycin  Family History  Problem Relation Age of Onset  . Heart disease Mother   . Stroke Mother   . Diabetes Mother   . Alzheimer's disease Father   . Parkinson's disease Father   . Cancer Sister     Social History Social History   Tobacco Use  . Smoking status: Never Smoker  . Smokeless tobacco: Never Used  Vaping Use  . Vaping Use: Never used  Substance Use Topics  . Alcohol use: No    Alcohol/week: 0.0 standard drinks  . Drug use: No    Review of Systems  Constitutional: No fever/chills.  Positive for generalized weakness. Eyes: No visual changes. ENT: No sore throat. Cardiovascular: Denies chest pain. Respiratory: Denies shortness of breath. Gastrointestinal: No abdominal pain.  No nausea, no vomiting.  No diarrhea.  No constipation. Genitourinary: Positive for dysuria. Musculoskeletal: Negative for back pain. Skin: Negative for rash. Neurological: Negative for headaches,  focal weakness or numbness.  ____________________________________________   PHYSICAL EXAM:  VITAL SIGNS: ED Triage Vitals [01/27/21 2144]  Enc Vitals Group     BP      Pulse      Resp      Temp      Temp src      SpO2      Weight      Height      Head Circumference      Peak Flow      Pain Score 4     Pain Loc      Pain Edu?      Excl. in GC?     Constitutional: Alert and oriented to person, place, time, and situation. Eyes: Conjunctivae are normal. Head: Atraumatic. Nose: No congestion/rhinnorhea. Mouth/Throat: Mucous membranes are moist. Neck: Normal ROM Cardiovascular: Normal rate, regular rhythm. Grossly normal heart sounds. Respiratory: Normal respiratory effort.  No retractions. Lungs CTAB. Gastrointestinal: Soft and nontender. No distention. Genitourinary: deferred Musculoskeletal: No lower extremity tenderness nor edema. Neurologic:  Normal speech and language. No gross focal  neurologic deficits are appreciated. Skin:  Skin is warm, dry and intact. No rash noted. Psychiatric: Mood and affect are normal. Speech and behavior are normal.  ____________________________________________   LABS (all labs ordered are listed, but only abnormal results are displayed)  Labs Reviewed  COMPREHENSIVE METABOLIC PANEL - Abnormal; Notable for the following components:      Result Value   Sodium 133 (*)    Potassium 3.4 (*)    CO2 20 (*)    Glucose, Bld 164 (*)    Calcium 8.7 (*)    Albumin 3.4 (*)    All other components within normal limits  CBC WITH DIFFERENTIAL/PLATELET - Abnormal; Notable for the following components:   WBC 21.2 (*)    Neutro Abs 17.6 (*)    Monocytes Absolute 1.8 (*)    Abs Immature Granulocytes 0.13 (*)    All other components within normal limits  APTT - Abnormal; Notable for the following components:   aPTT 45 (*)    All other components within normal limits  URINE CULTURE  CULTURE, BLOOD (ROUTINE X 2)  CULTURE, BLOOD (ROUTINE X 2)  RESP PANEL BY RT-PCR (FLU A&B, COVID) ARPGX2  LACTIC ACID, PLASMA  LACTIC ACID, PLASMA  PROTIME-INR  URINALYSIS, COMPLETE (UACMP) WITH MICROSCOPIC  TROPONIN I (HIGH SENSITIVITY)  TROPONIN I (HIGH SENSITIVITY)   ____________________________________________  EKG  ED ECG REPORT I, Chesley Noon, the attending physician, personally viewed and interpreted this ECG.   Date: 01/27/2021  EKG Time: 21:45  Rate: 91  Rhythm: normal sinus rhythm  Axis: Normal  Intervals:none  ST&T Change: None  PROCEDURES  Procedure(s) performed (including Critical Care):  .Critical Care Performed by: Chesley Noon, MD Authorized by: Chesley Noon, MD   Critical care provider statement:    Critical care time (minutes):  45   Critical care time was exclusive of:  Separately billable procedures and treating other patients and teaching time   Critical care was necessary to treat or prevent imminent or  life-threatening deterioration of the following conditions:  Sepsis   Critical care was time spent personally by me on the following activities:  Discussions with consultants, evaluation of patient's response to treatment, examination of patient, ordering and performing treatments and interventions, ordering and review of laboratory studies, ordering and review of radiographic studies, pulse oximetry, re-evaluation of patient's condition, obtaining history from patient or surrogate and review of old charts  I assumed direction of critical care for this patient from another provider in my specialty: no     Care discussed with: admitting provider       ____________________________________________   INITIAL IMPRESSION / ASSESSMENT AND PLAN / ED COURSE       80 year old female with past medical history of hypertension, hyperlipidemia, diabetes, chronic pain syndrome, and dementia who presents to the ED for worsening generalized weakness after a fall out of her chair last night, noted to be febrile with EMS.  Given her fever and leukocytosis, presentation is concerning for sepsis.  Patient given IV fluid resuscitation and started on broad-spectrum antibiotics.  She does complain of dysuria and UTI seems most likely source.  Chest x-ray reviewed by me and shows no infiltrate, edema, or effusion.  CT head performed given fall and is negative for acute process.  Additional labs are unremarkable and patient is at her baseline mental status with no focal neurologic deficits.  Case discussed with hospitalist for admission.      ____________________________________________   FINAL CLINICAL IMPRESSION(S) / ED DIAGNOSES  Final diagnoses:  Sepsis without acute organ dysfunction, due to unspecified organism [facility])  Generalized weakness  Fall, initial encounter     ED Discharge Orders    None       Note:  This document was prepared using Dragon voice recognition software and may include  unintentional dictation errors.   Chesley Noon, MD 01/27/21 [phone]

## 2021-01-27 NOTE — Sepsis Progress Note (Signed)
Monitoring for code sepsis protocol. 

## 2021-01-27 NOTE — Consult Note (Signed)
PHARMACY -  BRIEF ANTIBIOTIC NOTE   Pharmacy has received consult(s) for vancomycin and cefepime from an ED provider.  The patient's profile has been reviewed for ht/wt/allergies/indication/available labs.    One time order(s) placed for   Cefepime 2 gram  Vancomycin 2000 mg   Further antibiotics/pharmacy consults should be ordered by admitting physician if indicated.                       Thank you, Sharen Hones, PharmD, BCPS 01/27/2021  10:14 PM

## 2021-01-27 NOTE — ED Triage Notes (Signed)
Ems states that they were called for for weakness and fall from a chair. Upon further assessment the pt was c/o of  Burning when peeing, and fever and lower abdominal pain

## 2021-01-27 NOTE — H&P (Signed)
History and Physical    Cynthia Jones QQV:[id number] DOB: 1941-04-30 DOA: 01/27/2021  PCP: Loura Pardon, MD  Patient coming from: Home via EMS  I have personally briefly reviewed patient's old medical records in [facility]  Chief Complaint: Weakness, dysuria, fever  HPI: Cynthia Jones is a 80 y.o. female with medical history significant for Parkinson's disease, T2DM, HTN, hypothyroidism who presents to the ED for evaluation of weakness, dysuria, fever.  Patient's daughter is at bedside to supplement history.  Patient reports 2 days of generalized weakness, dysuria, and suprapubic discomfort.  She lives with her daughter.  Normally she ambulates with the use of a walker but she has been too weak to ambulate with assistive device or assistance from her family.  She has had chills and diaphoresis.  Daughter noted that she has had some intermittent confusion as well.  She had occasional shortness of breath without cough.  She denies any chest pain, nausea, vomiting.  ED Course:  Initial vitals showed BP 161/84, pulse 91, RR 35, temp 103 F, SPO2 98% on room air.  Labs show WBC 21.2, hemoglobin 13.4, platelets 275,000, sodium 133, potassium 3.4, bicarb 20, BUN 9, creatinine 0.76, serum glucose 164, lactic acid 1.3x2, high-sensitivity troponin I 9.  Blood cultures obtained and pending.  Urinalysis and urine culture ordered and pending collection.  SARS-CoV-2 PCR ordered and pending collection.  Portable chest x-ray was negative for focal consolidation, edema, effusion.  CT head without contrast is negative for acute intracranial abnormality.  Atrophy and mild chronic small vessel ischemic change of the white matter noted.  Patient was given 2.5 L LR.  She received IV cefepime and Flagyl.  The hospitalist service was consulted to admit for further evaluation and management.  Review of Systems: All systems reviewed and are negative except as documented in history of present  illness above.   Past Medical History:  Diagnosis Date  . Acute anxiety 03/06/2015  . Allergy   . Anxiety   . Degenerative joint disease of knee   . Depression   . Fibromyalgia   . Obesity   . Osteopenia   . RAD (reactive airway disease)     Past Surgical History:  Procedure Laterality Date  . ABDOMINAL HYSTERECTOMY    . APPENDECTOMY    . BREAST CYST ASPIRATION Right 04/18/2013   neg  . CHOLECYSTECTOMY    . EYE SURGERY    . JOINT REPLACEMENT  2014   right knee  . PAROTIDECTOMY Left   . TONSILLECTOMY      Social History:  reports that she has never smoked. She has never used smokeless tobacco. She reports that she does not drink alcohol and does not use drugs.  Allergies  Allergen Reactions  . Nsaids Other (See Comments)    GI upset, hx of H pylori and gastric ulcer  . Erythromycin Nausea Only    Family History  Problem Relation Age of Onset  . Heart disease Mother   . Stroke Mother   . Diabetes Mother   . Alzheimer's disease Father   . Parkinson's disease Father   . Cancer Sister      Prior to Admission medications   Medication Sig Start Date End Date Taking? Authorizing Provider  b complex vitamins tablet Take 1 tablet by mouth daily.    [provider]  carbidopa-levodopa (SINEMET IR) 25-100 MG tablet Take 1 tablet by mouth 4 (four) times daily. 07/04/20   [provider]  carvedilol (COREG) 6.25  MG tablet TAKE 1 TABLET BY MOUTH TWICE A DAY WITH A MEAL 07/30/20   Johnson, Megan P, DO  diclofenac Sodium (VOLTAREN) 1 % GEL Apply 2 g topically as needed.  01/14/20   [provider]  esomeprazole (NEXIUM) 40 MG capsule Take 40 mg by mouth as needed.     [provider]  levothyroxine (SYNTHROID) 50 MCG tablet TAKE 1 TABLET BY MOUTH EVERY DAY 09/13/20   Cannady, Jolene T, NP  lidocaine (LIDODERM) 5 % Place 1 patch onto the skin daily. Remove & Discard patch within 12 hours or as directed by MD 10/31/20   Loura Pardon, MD  losartan  (COZAAR) 100 MG tablet TAKE 1 TABLET BY MOUTH DAILY 11/23/20   Vigg, Avanti, MD  metFORMIN (GLUCOPHAGE) 500 MG tablet Take 1 tablet (500 mg total) by mouth daily with breakfast. 01/02/21   Vigg, Avanti, MD  mirtazapine (REMERON) 7.5 MG tablet TAKE 1 TABLET BY MOUTH AT BEDTIME. 01/26/21   Vigg, Avanti, MD  nystatin (MYCOSTATIN/NYSTOP) powder APPLY TO AFFECTED AREA 3 TIMES A DAY 12/24/20   Vigg, Avanti, MD  ondansetron (ZOFRAN) 8 MG tablet Take 1 tablet (8 mg total) by mouth every 8 (eight) hours as needed for nausea or vomiting. 11/21/20   Vigg, Avanti, MD  pregabalin (LYRICA) 25 MG capsule Take 1 capsule (25 mg total) by mouth daily. 11/28/20   Vigg, Avanti, MD  sucralfate (CARAFATE) 1 g tablet TAKE 1 TABLET (1 G TOTAL) BY MOUTH 4 (FOUR) TIMES DAILY - WITH MEALS AND AT BEDTIME. 11/23/20   Johnson, Megan P, DO  UNABLE TO FIND Take by mouth as needed. Coriciden HBP    [provider]  UNABLE TO FIND Take by mouth daily. CBD gummies    [provider]  Vitamin D, Ergocalciferol, (DRISDOL) 1.25 MG (50000 UNIT) CAPS capsule TAKE 1 CAPSULE (50,000 UNITS TOTAL) BY MOUTH EVERY 7 (SEVEN) DAYS 01/14/21   Loura Pardon, MD    Physical Exam: Vitals:   01/27/21 2145 01/27/21 2252  BP:  (!) 155/81  Pulse:  92  Resp:  (!) 24  Temp:  (!) 103 F (39.4 C)  TempSrc:  Oral  SpO2:  96%  Weight: 104 kg   Height: 5' (1.524 m)    Constitutional: Obese woman resting in bed with head elevated, NAD, calm, comfortable Eyes: PERRL, lids and conjunctivae normal ENMT: Mucous membranes are moist. Posterior pharynx clear of any exudate or lesions.Normal dentition.  Neck: normal, supple, no masses. Respiratory: clear to auscultation bilaterally, no wheezing, no crackles. Normal respiratory effort. No accessory muscle use.  Cardiovascular: Regular rate and rhythm, systolic murmur present. No extremity edema. 2+ pedal pulses. Abdomen: Suprapubic tenderness, no masses palpated. No  hepatosplenomegaly. Musculoskeletal: no clubbing / cyanosis. No joint deformity upper and lower extremities. Good ROM, no contractures. Normal muscle tone.  Skin: no rashes, lesions, ulcers. No induration Neurologic: CN 2-12 grossly intact. Sensation intact. Strength 5/5 in all 4.  Psychiatric: Normal judgment and insight. Alert and oriented x 3. Normal mood.   Labs on Admission: I have personally reviewed following labs and imaging studies  CBC: Recent Labs  Lab 01/27/21 2148  WBC 21.2*  NEUTROABS 17.6*  HGB 13.4  HCT 38.7  MCV 90.4  PLT 275   Basic Metabolic Panel: Recent Labs  Lab 01/27/21 2148  NA 133*  K 3.4*  CL 99  CO2 20*  GLUCOSE 164*  BUN 9  CREATININE 0.76  CALCIUM 8.7*   GFR: Estimated Creatinine Clearance: 63  mL/min (by C-G formula based on SCr of 0.76 mg/dL). Liver Function Tests: Recent Labs  Lab 01/27/21 2148  AST 20  ALT 6  ALKPHOS 42  BILITOT 1.0  PROT 7.6  ALBUMIN 3.4*   No results for input(s): LIPASE, AMYLASE in the last 168 hours. No results for input(s): AMMONIA in the last 168 hours. Coagulation Profile: Recent Labs  Lab 01/27/21 2148  INR 1.1   Cardiac Enzymes: No results for input(s): CKTOTAL, CKMB, CKMBINDEX, TROPONINI in the last 168 hours. BNP (last 3 results) No results for input(s): PROBNP in the last 8760 hours. HbA1C: No results for input(s): HGBA1C in the last 72 hours. CBG: No results for input(s): GLUCAP in the last 168 hours. Lipid Profile: No results for input(s): CHOL, HDL, LDLCALC, TRIG, CHOLHDL, LDLDIRECT in the last 72 hours. Thyroid Function Tests: No results for input(s): TSH, T4TOTAL, FREET4, T3FREE, THYROIDAB in the last 72 hours. Anemia Panel: No results for input(s): VITAMINB12, FOLATE, FERRITIN, TIBC, IRON, RETICCTPCT in the last 72 hours. Urine analysis:    Component Value Date/Time   COLORURINE YELLOW (A) 11/20/2018 1846   APPEARANCEUR Clear 10/27/2019 1507   LABSPEC 1.025 11/20/2018 1846    PHURINE 5.0 11/20/2018 1846   GLUCOSEU Negative 10/27/2019 1507   HGBUR NEGATIVE 11/20/2018 1846   BILIRUBINUR Negative 10/27/2019 1507   KETONESUR NEGATIVE 11/20/2018 1846   PROTEINUR Negative 10/27/2019 1507   PROTEINUR NEGATIVE 11/20/2018 1846   NITRITE Negative 10/27/2019 1507   NITRITE NEGATIVE 11/20/2018 1846   LEUKOCYTESUR Negative 10/27/2019 1507   LEUKOCYTESUR NEGATIVE 11/20/2018 1846    Radiological Exams on Admission: CT Head Wo Contrast  Result Date: 01/27/2021 CLINICAL DATA:  Head trauma fell from chair EXAM: CT HEAD WITHOUT CONTRAST TECHNIQUE: Contiguous axial images were obtained from the base of the skull through the vertex without intravenous contrast. COMPARISON:  CT brain 11/20/2019 FINDINGS: Brain: No acute territorial infarction, hemorrhage, or intracranial mass. Mild atrophy. Patchy white matter hypodensity consistent with chronic small vessel ischemic change. Nonenlarged ventricles Vascular: No hyperdense vessels.  Carotid vascular calcification Skull: Normal. Negative for fracture or focal lesion. Sinuses/Orbits: No acute finding. Lobulated mucosal thickening in the sinuses Other: None IMPRESSION: 1. No CT evidence for acute intracranial abnormality. 2. Atrophy and mild chronic small vessel ischemic change of the white matter Electronically Signed   By: Jasmine Pang M.D.   On: 01/27/2021 23:22   DG Chest Port 1 View  Result Date: 01/27/2021 CLINICAL DATA:  Weakness, fell from chair, fever, lower abdominal pain EXAM: PORTABLE CHEST 1 VIEW COMPARISON:  07/09/2016 FINDINGS: The heart size and mediastinal contours are within normal limits. Both lungs are clear. The visualized skeletal structures are unremarkable. IMPRESSION: No active disease. Electronically Signed   By: Sharlet Salina M.D.   On: 01/27/2021 22:02    EKG: Pending. Assessment/Plan Principal Problem:   Sepsis (HCC) Active Problems:   Hypertension associated with diabetes (HCC)   Diabetes mellitus  (HCC)   Osteoarthritis   Neuropathy due to secondary diabetes (HCC)   Hypothyroid   Parkinson disease (HCC)   Hyperlipidemia associated with type 2 diabetes mellitus (HCC)   Cynthia Jones is a 80 y.o. female with medical history significant for Parkinson's disease, T2DM, HTN, hypothyroidism who is admitted with sepsis.  Sepsis: Patient presenting with fever, tachypnea, leukocytosis.  History of dysuria and suprapubic tenderness on exam suggestive of UTI/cystitis as source of infection however urine collection is still pending.  No evidence of pneumonia on CXR. -Continue IV ceftriaxone -Follow  blood cultures -Obtain UA and urine culture, bladder scan and in and out cath if needed -Continue IV fluid hydration overnight  Generalized weakness: PT/OT eval.  Type 2 diabetes: Holding home metformin.  Placed on SSI with HS coverage.  Hypertension: Home Coreg and losartan current on hold with low DBP.  Hypothyroidism: Continue Synthroid.  Hyperlipidemia: Not currently on statin.  Parkinson's disease: Daughter reports some component of dementia as well with frequent sundowning. -Continue Sinemet -Delirium precautions  Chronic pain syndrome/fibromyalgia/osteoarthritis/depression/anxiety: -Continue Lyrica and Remeron  DVT prophylaxis: Lovenox Code Status: DNR, confirmed with patient Family Communication: Discussed with patient's daughter at bedside Disposition Plan: From home, dispo pending clinical progress Consults called: None Level of care: Med-Surg Admission status:  Status is: Observation  The patient remains OBS appropriate and will d/c before 2 midnights.  Dispo: The patient is from: Home              Anticipated d/c is to: Home versus SNF              Patient currently is not medically stable to d/c.   Difficult to place patient No  Darreld Mclean MD [facility]  If 7PM-7AM, please contact night-coverage [email or web address]  01/27/2021, 11:41 PM

## 2021-01-28 ENCOUNTER — Other Ambulatory Visit: Payer: Self-pay

## 2021-01-28 ENCOUNTER — Inpatient Hospital Stay: Payer: Medicare HMO

## 2021-01-28 ENCOUNTER — Encounter: Payer: Self-pay | Admitting: Internal Medicine

## 2021-01-28 DIAGNOSIS — Z20822 Contact with and (suspected) exposure to covid-19: Secondary | ICD-10-CM | POA: Diagnosis present

## 2021-01-28 DIAGNOSIS — B9620 Unspecified Escherichia coli [E. coli] as the cause of diseases classified elsewhere: Secondary | ICD-10-CM | POA: Diagnosis present

## 2021-01-28 DIAGNOSIS — R509 Fever, unspecified: Secondary | ICD-10-CM | POA: Diagnosis not present

## 2021-01-28 DIAGNOSIS — E876 Hypokalemia: Secondary | ICD-10-CM

## 2021-01-28 DIAGNOSIS — Z90710 Acquired absence of both cervix and uterus: Secondary | ICD-10-CM | POA: Diagnosis not present

## 2021-01-28 DIAGNOSIS — E1140 Type 2 diabetes mellitus with diabetic neuropathy, unspecified: Secondary | ICD-10-CM

## 2021-01-28 DIAGNOSIS — E871 Hypo-osmolality and hyponatremia: Secondary | ICD-10-CM | POA: Diagnosis present

## 2021-01-28 DIAGNOSIS — E1169 Type 2 diabetes mellitus with other specified complication: Secondary | ICD-10-CM | POA: Diagnosis present

## 2021-01-28 DIAGNOSIS — F419 Anxiety disorder, unspecified: Secondary | ICD-10-CM | POA: Diagnosis present

## 2021-01-28 DIAGNOSIS — Z66 Do not resuscitate: Secondary | ICD-10-CM | POA: Diagnosis present

## 2021-01-28 DIAGNOSIS — Z886 Allergy status to analgesic agent status: Secondary | ICD-10-CM | POA: Diagnosis not present

## 2021-01-28 DIAGNOSIS — R531 Weakness: Secondary | ICD-10-CM

## 2021-01-28 DIAGNOSIS — F0280 Dementia in other diseases classified elsewhere without behavioral disturbance: Secondary | ICD-10-CM | POA: Diagnosis present

## 2021-01-28 DIAGNOSIS — M48061 Spinal stenosis, lumbar region without neurogenic claudication: Secondary | ICD-10-CM | POA: Diagnosis not present

## 2021-01-28 DIAGNOSIS — Z9049 Acquired absence of other specified parts of digestive tract: Secondary | ICD-10-CM | POA: Diagnosis not present

## 2021-01-28 DIAGNOSIS — E039 Hypothyroidism, unspecified: Secondary | ICD-10-CM

## 2021-01-28 DIAGNOSIS — K5732 Diverticulitis of large intestine without perforation or abscess without bleeding: Secondary | ICD-10-CM | POA: Diagnosis not present

## 2021-01-28 DIAGNOSIS — K429 Umbilical hernia without obstruction or gangrene: Secondary | ICD-10-CM | POA: Diagnosis not present

## 2021-01-28 DIAGNOSIS — Z888 Allergy status to other drugs, medicaments and biological substances status: Secondary | ICD-10-CM | POA: Diagnosis not present

## 2021-01-28 DIAGNOSIS — E785 Hyperlipidemia, unspecified: Secondary | ICD-10-CM | POA: Diagnosis present

## 2021-01-28 DIAGNOSIS — M797 Fibromyalgia: Secondary | ICD-10-CM | POA: Diagnosis present

## 2021-01-28 DIAGNOSIS — Z833 Family history of diabetes mellitus: Secondary | ICD-10-CM | POA: Diagnosis not present

## 2021-01-28 DIAGNOSIS — I152 Hypertension secondary to endocrine disorders: Secondary | ICD-10-CM | POA: Diagnosis present

## 2021-01-28 DIAGNOSIS — A419 Sepsis, unspecified organism: Secondary | ICD-10-CM | POA: Diagnosis present

## 2021-01-28 DIAGNOSIS — F32A Depression, unspecified: Secondary | ICD-10-CM | POA: Diagnosis present

## 2021-01-28 DIAGNOSIS — R300 Dysuria: Secondary | ICD-10-CM | POA: Diagnosis present

## 2021-01-28 DIAGNOSIS — G20 Parkinson's disease: Secondary | ICD-10-CM | POA: Diagnosis present

## 2021-01-28 DIAGNOSIS — K5720 Diverticulitis of large intestine with perforation and abscess without bleeding: Secondary | ICD-10-CM | POA: Diagnosis present

## 2021-01-28 DIAGNOSIS — Z6841 Body Mass Index (BMI) 40.0 and over, adult: Secondary | ICD-10-CM | POA: Diagnosis not present

## 2021-01-28 LAB — CBC
HCT: 35 % — ABNORMAL LOW (ref 36.0–46.0)
Hemoglobin: 12.1 g/dL (ref 12.0–15.0)
MCH: 31.2 pg (ref 26.0–34.0)
MCHC: 34.6 g/dL (ref 30.0–36.0)
MCV: 90.2 fL (ref 80.0–100.0)
Platelets: 249 10*3/uL (ref 150–400)
RBC: 3.88 MIL/uL (ref 3.87–5.11)
RDW: 14.8 % (ref 11.5–15.5)
WBC: 26.5 10*3/uL — ABNORMAL HIGH (ref 4.0–10.5)
nRBC: 0 % (ref 0.0–0.2)

## 2021-01-28 LAB — COMPREHENSIVE METABOLIC PANEL
ALT: 10 U/L (ref 0–44)
AST: 17 U/L (ref 15–41)
Albumin: 3 g/dL — ABNORMAL LOW (ref 3.5–5.0)
Alkaline Phosphatase: 38 U/L (ref 38–126)
Anion gap: 12 (ref 5–15)
BUN: 9 mg/dL (ref 8–23)
CO2: 21 mmol/L — ABNORMAL LOW (ref 22–32)
Calcium: 8.2 mg/dL — ABNORMAL LOW (ref 8.9–10.3)
Chloride: 101 mmol/L (ref 98–111)
Creatinine, Ser: 0.8 mg/dL (ref 0.44–1.00)
GFR, Estimated: >60 mL/min (ref >60)
Glucose, Bld: 135 mg/dL — ABNORMAL HIGH (ref 70–99)
Potassium: 3 mmol/L — ABNORMAL LOW (ref 3.5–5.1)
Sodium: 134 mmol/L — ABNORMAL LOW (ref 135–145)
Total Bilirubin: 1.1 mg/dL (ref 0.3–1.2)
Total Protein: 6.5 g/dL (ref 6.5–8.1)

## 2021-01-28 LAB — BLOOD CULTURE ID PANEL (REFLEXED) - BCID2

## 2021-01-28 LAB — RESP PANEL BY RT-PCR (FLU A&B, COVID) ARPGX2
Influenza A by PCR: NEGATIVE
Influenza B by PCR: NEGATIVE
SARS Coronavirus 2 by RT PCR: NEGATIVE

## 2021-01-28 LAB — URINALYSIS, COMPLETE (UACMP) WITH MICROSCOPIC
Bilirubin Urine: NEGATIVE
Glucose, UA: NEGATIVE mg/dL
Hgb urine dipstick: NEGATIVE
Ketones, ur: 5 mg/dL — AB
Leukocytes,Ua: NEGATIVE
Nitrite: NEGATIVE
Protein, ur: NEGATIVE mg/dL
Specific Gravity, Urine: 1.009 (ref 1.005–1.030)
pH: 6 (ref 5.0–8.0)

## 2021-01-28 LAB — GLUCOSE, CAPILLARY
Glucose-Capillary: 137 mg/dL — ABNORMAL HIGH (ref 70–99)
Glucose-Capillary: 113 mg/dL — ABNORMAL HIGH (ref 70–99)
Glucose-Capillary: 108 mg/dL — ABNORMAL HIGH (ref 70–99)
Glucose-Capillary: 167 mg/dL — ABNORMAL HIGH (ref 70–99)
Glucose-Capillary: 139 mg/dL — ABNORMAL HIGH (ref 70–99)
Glucose-Capillary: 139 mg/dL — ABNORMAL HIGH (ref 70–99)

## 2021-01-28 LAB — TROPONIN I (HIGH SENSITIVITY): Troponin I (High Sensitivity): 11 ng/L (ref <18)

## 2021-01-28 LAB — PROCALCITONIN: Procalcitonin: 0.22 ng/mL

## 2021-01-28 LAB — MAGNESIUM: Magnesium: 1.5 mg/dL — ABNORMAL LOW (ref 1.7–2.4)

## 2021-01-28 MED ORDER — ACETAMINOPHEN 650 MG RE SUPP: 650.0000 mg | Freq: Four times a day (QID) | RECTAL | Status: DC | PRN

## 2021-01-28 MED ORDER — CARVEDILOL 6.25 MG PO TABS: 6.2500 mg | ORAL_TABLET | Freq: Two times a day (BID) | ORAL | Status: DC

## 2021-01-28 MED ORDER — ONDANSETRON HCL 4 MG/2ML IJ SOLN: 4.0000 mg | Freq: Four times a day (QID) | INTRAMUSCULAR | Status: DC | PRN

## 2021-01-28 MED ORDER — ENOXAPARIN SODIUM 60 MG/0.6ML IJ SOSY
0.5000 mg/kg | PREFILLED_SYRINGE | INTRAMUSCULAR | Status: DC
  Administered 2021-01-28: 52.5 mg via SUBCUTANEOUS
  Filled 2021-01-28: qty 0.6

## 2021-01-28 MED ORDER — LOSARTAN POTASSIUM 50 MG PO TABS
100.0000 mg | ORAL_TABLET | Freq: Every day | ORAL | Status: DC
  Administered 2021-01-28: 17:00:00 100 mg via ORAL
  Filled 2021-01-28: qty 2

## 2021-01-28 MED ORDER — SODIUM CHLORIDE 0.9% FLUSH
3.0000 mL | Freq: Two times a day (BID) | INTRAVENOUS | Status: DC
  Administered 2021-01-28 – 2021-02-02 (×9): 3 mL via INTRAVENOUS

## 2021-01-28 MED ORDER — METRONIDAZOLE 500 MG/100ML IV SOLN
500.0000 mg | Freq: Three times a day (TID) | INTRAVENOUS | Status: DC
  Administered 2021-01-28: 500 mg via INTRAVENOUS
  Filled 2021-01-28 (×3): qty 100

## 2021-01-28 MED ORDER — POTASSIUM CHLORIDE IN NACL 40-0.9 MEQ/L-% IV SOLN
INTRAVENOUS | Status: AC
  Filled 2021-01-28 (×2): qty 1000

## 2021-01-28 MED ORDER — MIRTAZAPINE 15 MG PO TABS
7.5000 mg | ORAL_TABLET | Freq: Every day | ORAL | Status: DC
  Administered 2021-01-28 – 2021-02-01 (×6): 7.5 mg via ORAL
  Filled 2021-01-28 (×6): qty 1

## 2021-01-28 MED ORDER — IOHEXOL 9 MG/ML PO SOLN
500.0000 mL | ORAL | Status: AC
  Administered 2021-01-28 (×2): 500 mL via ORAL

## 2021-01-28 MED ORDER — ACETAMINOPHEN 325 MG PO TABS
650.0000 mg | ORAL_TABLET | Freq: Four times a day (QID) | ORAL | Status: DC | PRN
  Administered 2021-01-28: 650 mg via ORAL
  Filled 2021-01-28: qty 2

## 2021-01-28 MED ORDER — SODIUM CHLORIDE 0.9 % IV SOLN
1.0000 g | INTRAVENOUS | Status: DC
  Administered 2021-01-28: 06:00:00 1 g via INTRAVENOUS
  Filled 2021-01-28: qty 1
  Filled 2021-01-28: qty 10

## 2021-01-28 MED ORDER — ONDANSETRON HCL 4 MG PO TABS: 4.0000 mg | ORAL_TABLET | Freq: Four times a day (QID) | ORAL | Status: DC | PRN

## 2021-01-28 MED ORDER — POTASSIUM CHLORIDE CRYS ER 20 MEQ PO TBCR
40.0000 meq | EXTENDED_RELEASE_TABLET | ORAL | Status: AC
  Administered 2021-01-28 (×2): 40 meq via ORAL
  Filled 2021-01-28 (×2): qty 2

## 2021-01-28 MED ORDER — INSULIN ASPART 100 UNIT/ML IJ SOLN: 0.0000 [IU] | Freq: Every day | INTRAMUSCULAR | Status: DC

## 2021-01-28 MED ORDER — PIPERACILLIN-TAZOBACTAM 3.375 G IVPB
3.3750 g | Freq: Three times a day (TID) | INTRAVENOUS | Status: DC
  Administered 2021-01-28 – 2021-02-02 (×14): 3.375 g via INTRAVENOUS
  Filled 2021-01-28 (×14): qty 50

## 2021-01-28 MED ORDER — PREGABALIN 25 MG PO CAPS
25.0000 mg | ORAL_CAPSULE | Freq: Every day | ORAL | Status: DC
  Administered 2021-01-28 – 2021-02-02 (×6): 25 mg via ORAL
  Filled 2021-01-28 (×6): qty 1

## 2021-01-28 MED ORDER — DULOXETINE HCL 30 MG PO CPEP
60.0000 mg | ORAL_CAPSULE | Freq: Every day | ORAL | Status: DC
  Administered 2021-01-28 – 2021-02-02 (×6): 60 mg via ORAL
  Filled 2021-01-28 (×6): qty 2

## 2021-01-28 MED ORDER — ENOXAPARIN SODIUM 60 MG/0.6ML IJ SOSY
0.5000 mg/kg | PREFILLED_SYRINGE | INTRAMUSCULAR | Status: DC
  Administered 2021-01-30 – 2021-02-02 (×4): 52.5 mg via SUBCUTANEOUS
  Filled 2021-01-28 (×4): qty 0.6

## 2021-01-28 MED ORDER — LEVOTHYROXINE SODIUM 50 MCG PO TABS
50.0000 ug | ORAL_TABLET | Freq: Every day | ORAL | Status: DC
  Administered 2021-01-28 – 2021-02-02 (×5): 50 ug via ORAL
  Filled 2021-01-28 (×7): qty 1

## 2021-01-28 MED ORDER — CARBIDOPA-LEVODOPA 25-100 MG PO TABS
1.0000 | ORAL_TABLET | Freq: Four times a day (QID) | ORAL | Status: DC
  Administered 2021-01-28 – 2021-02-02 (×21): 1 via ORAL
  Filled 2021-01-28 (×21): qty 1

## 2021-01-28 MED ORDER — MAGNESIUM SULFATE 4 GM/100ML IV SOLN
4.0000 g | Freq: Once | INTRAVENOUS | Status: AC
  Administered 2021-01-28: 4 g via INTRAVENOUS
  Filled 2021-01-28: qty 100

## 2021-01-28 MED ORDER — INSULIN ASPART 100 UNIT/ML IJ SOLN
0.0000 [IU] | Freq: Three times a day (TID) | INTRAMUSCULAR | Status: DC
  Administered 2021-01-28: 1 [IU] via SUBCUTANEOUS
  Administered 2021-01-28 – 2021-01-31 (×5): 2 [IU] via SUBCUTANEOUS
  Administered 2021-01-31: 17:00:00 3 [IU] via SUBCUTANEOUS
  Administered 2021-02-01: 09:00:00 1 [IU] via SUBCUTANEOUS
  Administered 2021-02-01: 16:00:00 2 [IU] via SUBCUTANEOUS
  Administered 2021-02-01: 14:00:00 3 [IU] via SUBCUTANEOUS
  Administered 2021-02-02: 12:00:00 2 [IU] via SUBCUTANEOUS
  Filled 2021-01-28 (×10): qty 1

## 2021-01-28 NOTE — Progress Notes (Signed)
OT Cancellation Note  Patient Details Name: Cynthia Jones MRN: [id number] DOB: [birth year]   Cancelled Treatment:    Reason Eval/Treat Not Completed: Fatigue/lethargy limiting ability to participate. Pt states she has recently had PT and is too tired to participate in additional therapy at this time. Will attempt to see pt at a later time/date as schedule allows and pt is willing.   Latina Craver, PhD, MS, OTR/L 01/28/21, 1:43 PM

## 2021-01-28 NOTE — Progress Notes (Signed)
PHARMACIST - PHYSICIAN COMMUNICATION  CONCERNING:  Enoxaparin (Lovenox) for DVT Prophylaxis    RECOMMENDATION: Patient was prescribed enoxaprin 40mg  q24 hours for VTE prophylaxis.   Filed Weights   01/27/21 2145  Weight: 104 kg (229 lb 4.5 oz)    Body mass index is 44.78 kg/m.  Estimated Creatinine Clearance: 63 mL/min (by C-G formula based on SCr of 0.76 mg/dL).   Based on [facility] policy patient is candidate for enoxaparin 0.5mg /kg TBW SQ every 24 hours based on BMI being >30.  DESCRIPTION: Pharmacy has adjusted enoxaparin dose per [facility] policy.  Patient is now receiving enoxaparin 0.5 mg/kg every 24 hours   [facility], PharmD, [facility] 01/28/2021 12:47 AM

## 2021-01-28 NOTE — Progress Notes (Signed)
OT Cancellation Note  Patient Details Name: GLENISHA GUNDRY MRN: [id number] DOB: 1941-07-22   Cancelled Treatment:    Reason Eval/Treat Not Completed: Fatigue/lethargy limiting ability to participate. Made third attempt to visit Ms. Heine today, following her return to room from CT procedure. She again declined OT participation, citing fatigue, and requested we see her following the abdominal surgery she is scheduled to undergo tomorrow.  Latina Craver, PhD, MS, OTR/L 01/28/21, 4:31 PM

## 2021-01-28 NOTE — Consult Note (Signed)
SURGICAL CONSULTATION NOTE   HISTORY OF PRESENT ILLNESS (HPI):  80 y.o. female presented to [facility] ED for evaluation of abdominal pain since 3 days ago. Patient reports she has been feeling weak for the last 2 to 3 days.  She actually reported that she fell at home because she felt dizzy.  She was taken to the ED for further evaluation.  At the ED she also complained of abdominal pain.  Aminal pain localized to the lower abdomen.  No radiation.  No alleviating or aggravating factors.  Initially at the ED she was found with leukocytosis and urinalysis positive for UTI.  She was admitted with sepsis due to UTI.  Due to the persistent lower abdominal pain CT scan of the abdominal pelvis was done today.  CT scan of the abdomen pelvis shows perforated diverticulitis with 6 centimeter abscess.  There is no free air or free fluid.  I personally evaluated the images.  Surgery is consulted by Dr. Rito Ehrlich in this context for evaluation and management of diverticulitis with abscess.  PAST MEDICAL HISTORY (PMH):  Past Medical History:  Diagnosis Date  . Acute anxiety 03/06/2015  . Allergy   . Anxiety   . Degenerative joint disease of knee   . Depression   . Fibromyalgia   . Obesity   . Osteopenia   . RAD (reactive airway disease)      PAST SURGICAL HISTORY (PSH):  Past Surgical History:  Procedure Laterality Date  . ABDOMINAL HYSTERECTOMY    . APPENDECTOMY    . BREAST CYST ASPIRATION Right 04/18/2013   neg  . CHOLECYSTECTOMY    . EYE SURGERY    . JOINT REPLACEMENT  2014   right knee  . PAROTIDECTOMY Left   . TONSILLECTOMY       MEDICATIONS:  Prior to Admission medications   Medication Sig Start Date End Date Taking? Authorizing Provider  b complex vitamins tablet Take 1 tablet by mouth daily.    [provider]  carbidopa-levodopa (SINEMET IR) 25-100 MG tablet Take 1 tablet by mouth 4 (four) times daily. 07/04/20   [provider]  carvedilol (COREG) 6.25 MG tablet TAKE  1 TABLET BY MOUTH TWICE A DAY WITH A MEAL 07/30/20   Johnson, Megan P, DO  diclofenac Sodium (VOLTAREN) 1 % GEL Apply 2 g topically as needed.  01/14/20   [provider]  DULoxetine (CYMBALTA) 60 MG capsule Take 60 mg by mouth daily. 12/25/20   [provider]  esomeprazole (NEXIUM) 40 MG capsule Take 40 mg by mouth as needed.     [provider]  hydrochlorothiazide (HYDRODIURIL) 12.5 MG tablet Take 12.5 mg by mouth daily. 12/05/20   [provider]  levothyroxine (SYNTHROID) 50 MCG tablet TAKE 1 TABLET BY MOUTH EVERY DAY 09/13/20   Cannady, Jolene T, NP  lidocaine (LIDODERM) 5 % Place 1 patch onto the skin daily. Remove & Discard patch within 12 hours or as directed by MD 10/31/20   Loura Pardon, MD  losartan (COZAAR) 100 MG tablet TAKE 1 TABLET BY MOUTH DAILY 11/23/20   Vigg, Avanti, MD  metFORMIN (GLUCOPHAGE) 500 MG tablet Take 1 tablet (500 mg total) by mouth daily with breakfast. 01/02/21   Vigg, Avanti, MD  mirtazapine (REMERON) 7.5 MG tablet TAKE 1 TABLET BY MOUTH AT BEDTIME. 01/26/21   Vigg, Avanti, MD  nystatin (MYCOSTATIN/NYSTOP) powder APPLY TO AFFECTED AREA 3 TIMES A DAY 12/24/20   Vigg, Avanti, MD  ondansetron (ZOFRAN) 8 MG tablet Take 1  tablet (8 mg total) by mouth every 8 (eight) hours as needed for nausea or vomiting. 11/21/20   Vigg, Avanti, MD  pregabalin (LYRICA) 25 MG capsule Take 1 capsule (25 mg total) by mouth daily. 11/28/20   Vigg, Avanti, MD  sucralfate (CARAFATE) 1 g tablet TAKE 1 TABLET (1 G TOTAL) BY MOUTH 4 (FOUR) TIMES DAILY - WITH MEALS AND AT BEDTIME. 11/23/20   Johnson, Megan P, DO  UNABLE TO FIND Take by mouth as needed. Coriciden HBP    [provider]  UNABLE TO FIND Take by mouth daily. CBD gummies    [provider]  Vitamin D, Ergocalciferol, (DRISDOL) 1.25 MG (50000 UNIT) CAPS capsule TAKE 1 CAPSULE (50,000 UNITS TOTAL) BY MOUTH EVERY 7 (SEVEN) DAYS 01/14/21   Loura Pardon, MD     ALLERGIES:  Allergies  Allergen  Reactions  . Nsaids Other (See Comments)    GI upset, hx of H pylori and gastric ulcer  . Erythromycin Nausea Only     SOCIAL HISTORY:  Social History   Socioeconomic History  . Marital status: Widowed    Spouse name: Not on file  . Number of children: Not on file  . Years of education: Not on file  . Highest education level: 10th grade  Occupational History  . Not on file  Tobacco Use  . Smoking status: Never Smoker  . Smokeless tobacco: Never Used  Vaping Use  . Vaping Use: Never used  Substance and Sexual Activity  . Alcohol use: No    Alcohol/week: 0.0 standard drinks  . Drug use: No  . Sexual activity: Not on file  Other Topics Concern  . Not on file  Social History Narrative  . Not on file   Social Determinants of Health   Financial Resource Strain: Low Risk   . Difficulty of Paying Living Expenses: Not hard at all  Food Insecurity: No Food Insecurity  . Worried About Programme researcher, broadcasting/film/video in the Last Year: Never true  . Ran Out of Food in the Last Year: Never true  Transportation Needs: No Transportation Needs  . Lack of Transportation (Medical): No  . Lack of Transportation (Non-Medical): No  Physical Activity: Insufficiently Active  . Days of Exercise per Week: 7 days  . Minutes of Exercise per Session: 20 min  Stress: No Stress Concern Present  . Feeling of Stress : Not at all  Social Connections: Not on file  Intimate Partner Violence: Not on file      FAMILY HISTORY:  Family History  Problem Relation Age of Onset  . Heart disease Mother   . Stroke Mother   . Diabetes Mother   . Alzheimer's disease Father   . Parkinson's disease Father   . Cancer Sister      REVIEW OF SYSTEMS:  Constitutional: denies weight loss, fever, chills, or sweats  Eyes: denies any other vision changes, history of eye injury  ENT: denies sore throat, hearing problems  Respiratory: denies shortness of breath, wheezing  Cardiovascular: denies chest pain, palpitations   Gastrointestinal: positive abdominal pain, nausea and vomiting Genitourinary: denies burning with urination or urinary frequency Musculoskeletal: denies any other joint pains or cramps  Skin: denies any other rashes or skin discolorations  Neurological: denies any other headache, positive for dizziness, weakness  Psychiatric: denies any other depression, anxiety   All other review of systems were negative   VITAL SIGNS:  Temp:  [98.3 F (36.8 C)-103 F (39.4 C)] 99.9 F (37.7 C) (  05/31 1111) Pulse Rate:  [84-100] 100 (05/31 1111) Resp:  [16-26] 16 (05/31 1111) BP: (118-190)/(38-156) 126/43 (05/31 1111) SpO2:  [95 %-99 %] 97 % (05/31 1111) Weight:  [104 kg-105.1 kg] 105.1 kg (05/31 0214)     Height: 5' (152.4 cm) Weight: 105.1 kg BMI (Calculated): 45.25   INTAKE/OUTPUT:  This shift: Total I/O In: -  Out: 450 [Urine:450]  Last 2 shifts: @IOLAST2SHIFTS @   PHYSICAL EXAM:  Constitutional:  -- Normal body habitus  -- Awake, alert, and oriented x3  Eyes:  -- Pupils equally round and reactive to light  -- No scleral icterus  Ear, nose, and throat:  -- No jugular venous distension  Pulmonary:  -- No crackles  -- Equal breath sounds bilaterally -- Breathing non-labored at rest Cardiovascular:  -- S1, S2 present  -- No pericardial rubs Gastrointestinal:  -- Abdomen soft, mild tender to palpation in lower abdomen, non-distended, no guarding or rebound tenderness -- No abdominal masses appreciated, pulsatile or otherwise  Musculoskeletal and Integumentary:  -- Wounds: None appreciated -- Extremities: B/L UE and LE FROM, hands and feet warm, no edema  Neurologic:  -- Motor function: intact and symmetric -- Sensation: intact and symmetric   Labs:  CBC Latest Ref Rng & Units 01/28/2021 01/27/2021 10/27/2019  WBC 4.0 - 10.5 K/uL 26.5(H) 21.2(H) 8.4  Hemoglobin 12.0 - 15.0 g/dL 10/29/2019 62.3 76.2  Hematocrit 36.0 - 46.0 % 35.0(L) 38.7 38.9  Platelets 150 - 400 K/uL 249 275 280    CMP Latest Ref Rng & Units 01/28/2021 01/27/2021 03/01/2020  Glucose 70 - 99 mg/dL 05/02/2020) 517(O) 160(V)  BUN 8 - 23 mg/dL 9 9 17   Creatinine 0.44 - 1.00 mg/dL 371(G 6.26  Sodium 135 - 145 mmol/L 134(L) 133(L) 140  Potassium 3.5 - 5.1 mmol/L 3.0(L) 3.4(L) 4.4  Chloride 98 - 111 mmol/L 101 99 103  CO2 22 - 32 mmol/L 21(L) 20(L) 21  Calcium 8.9 - 10.3 mg/dL 8.2(L) 8.7(L) 9.4  Total Protein 6.5 - 8.1 g/dL 6.5 7.6 7.2  Total Bilirubin 0.3 - 1.2 mg/dL 1.1 1.0 0.2  Alkaline Phos 38 - 126 U/L 38 42 51  AST 15 - 41 U/L 17 20 17   ALT 0 - 44 U/L 10 6 15     Imaging studies:  EXAM: CT ABDOMEN AND PELVIS WITHOUT CONTRAST  TECHNIQUE: Multidetector CT imaging of the abdomen and pelvis was performed following the standard protocol without IV contrast. Oral contrast administered.  COMPARISON:  None.  FINDINGS: Lower chest: There is mild bibasilar atelectatic change. There are foci of coronary artery calcification.  Hepatobiliary: There is hepatic steatosis. No focal liver lesions are appreciable on this noncontrast enhanced study. Gallbladder is absent. There is no appreciable biliary duct dilatation.  Pancreas: There is no pancreatic mass or inflammatory focus.  Spleen: No splenic lesions are evident.  Adrenals/Urinary Tract: Adrenals bilaterally appear normal. There is mild perinephric stranding bilaterally without fluid collection. There is no evident renal mass or hydronephrosis on either side. No appreciable renal or ureteral calculus on either side. Urinary bladder is midline with wall thickness within normal limits.  Stomach/Bowel: There are multiple sigmoid diverticula. There is wall thickening in the mid sigmoid colon consistent with a degree of diverticulitis. There is a fluid collection containing air and mildly thickened wall which appears to arise from the sigmoid in the mid pelvic region immediately to the right of midline measuring 6.3 x 5.9 x 5.0 cm, an  apparent peridiverticular abscess. No other similar changes.  Elsewhere there is no bowel wall thickening or bowel obstruction. The terminal ileum appears normal. No periappendiceal region inflammation. No free air or portal venous air.  Vascular/Lymphatic: There is aortic atherosclerosis. No abdominal aortic aneurysm. No evident adenopathy in the abdomen or pelvis.  Reproductive: Uterus absent. No adnexal mass evident. Note that the diverticular abscess may abut right ovarian tissue.  Other: There is an umbilical region hernia containing fat but no bowel. No evident ascites in the abdomen or pelvis.  Musculoskeletal: Degenerative changes noted in the lower thoracic and lumbar regions. There is evidence spinal stenosis at L2-3 due to bony hypertrophy and disc protrusion. Similar changes are noted at L3-4 and to a lesser degree at L4-5. No blastic or lytic bone lesions. No intramuscular lesions. Soft tissue thickening is noted in the right lateral abdominal region.  IMPRESSION: 1. Mid to distal sigmoid diverticulitis. Apparent diverticular abscess arising posteriorly in the mid pelvic region slightly to the right of midline. This fluid collection which contains air and a mildly thickened wall appears to arise from the sigmoid colon. This apparent abscess measures 6.3 x 5.9 x 5.0 cm.  2. No bowel obstruction. No other abscess in the abdomen or pelvis. No evident free air. No periappendiceal region inflammation.  3. Soft tissue stranding in each perinephric region without fluid collection or evidence of renal abscess. No renal or ureteral calculi on either side. No hydronephrosis. Urinary bladder wall thickness normal.  4.  Umbilical hernia containing fat but no bowel.  5. Aortic Atherosclerosis (ICD10-I70.0). There are also foci of coronary artery calcification.  6. Spinal stenosis at L2-3 and to a lesser degree at L3-4 and L4-5 due to disc protrusion and bony  hypertrophy.  7.  Hepatic steatosis.  8. Soft tissue thickening in the lateral right abdominal wall which potentially may represent resolving hematoma. No well-defined fluid collection in this area.  These results will be called to the ordering clinician or representative by the Radiologist Assistant, and communication documented in the PACS or [employer].   Electronically Signed   By: Bretta Bang III M.D.   On: 01/28/2021 14:09  Assessment/Plan:  80 y.o. female with completed diverticulitis with abscess, complicated by pertinent comorbidities including Parkinson disease, type 2 diabetes mellitus, hypertension, hypothyroidism.  Patient CT scan of the abdomen and pelvis today confirm that the main cause of her fever and sepsis is due to perforated diverticulitis with abscess.  Patient with stable vital signs and no acute abdomen on physical exam.  Recommended to proceed with percutaneous drainage of the abscess for sepsis source control.  Continue IV antibiotic therapy.  I agree with clear liquids for today and n.p.o. after midnight for procedure.  Also hold Lovenox for 24 hours for percutaneous procedure.  Initially patient responded to percutaneous drainage and IV antibiotic therapy and no surgical management will be needed during this admission.  After recovering I will need to be discussed with the patient if she will benefit of partial colectomy as an elective surgery.  If the patient does not respond to percutaneous drainage and medical therapy patient will need partial colectomy with end colostomy during the admission.  This was discussed with the patient today, no family at bedside at the moment.   Gae Gallop, MD

## 2021-01-28 NOTE — Evaluation (Signed)
Physical Therapy Evaluation Patient Details Name: Cynthia Jones MRN: [id number] DOB: [birth year] Today's Date: 01/28/2021   History of Present Illness  Patient is a 80 y.o. female with medical history significant for Parkinson's disease, T2DM, HTN, hypothyroidism who presents to the ED for evaluation of weakness, dysuria, fever. Patient being evaluated for sepsis. Patient with recent fall from chair at home with head CT showing no evidence for acute intracranial abnormality.  Clinical Impression  Patient agreeable to PT evaluation. Patient reports she lives with her daughter who always provides assistance with ADLs at baseline. Up until a few days ago, patient was ambulating with a rolling walker and mobilizing without physical assistance at home per her report.  Currently, patient needs extensive assistance with bed mobility. Poor sitting balance with posterior lean, requiring assistance to maintain midline, and limited overall activity tolerance. Patient fatigued with minimal activity and reports mild nausea after mobilizing. Patient is not at her baseline level of functional mobility and would benefit from continued PT to maximize independence and facilitate return to prior level of function. Recommend SNF for short term rehab, however patient prefers to go home with family assistance.     Follow Up Recommendations SNF;Supervision for mobility/OOB    Equipment Recommendations  None recommended by PT    Recommendations for Other Services       Precautions / Restrictions Precautions Precautions: Fall Restrictions Weight Bearing Restrictions: No      Mobility  Bed Mobility Overal bed mobility: Needs Assistance Bed Mobility: Supine to Sit;Sit to Supine;Rolling Rolling: Mod assist   Supine to sit: Max assist Sit to supine: Max assist   General bed mobility comments: assistance for all bed mobility provided. verbal cues for sequencing and technique. activity tolerance limited by  fatigue    Transfers                 General transfer comment: unable to progress to standing due to poor sitting balance, decreased activity tolerance, generalized weakness  Ambulation/Gait                Stairs            Wheelchair Mobility    Modified Rankin (Stroke Patients Only)       Balance Overall balance assessment: History of Falls;Needs assistance Sitting-balance support: Feet unsupported;Bilateral upper extremity supported Sitting balance-Leahy Scale: Poor Sitting balance - Comments: posterior lean and Min A required to maintain sitting balance Postural control: Posterior lean                                   Pertinent Vitals/Pain Pain Assessment: Faces Faces Pain Scale: Hurts a little bit Pain Location: "belly" Pain Descriptors / Indicators: Discomfort Pain Intervention(s): Limited activity within patient's tolerance    Home Living Family/patient expects to be discharged to:: Private residence Living Arrangements: Children Available Help at Discharge: Family Type of Home: Mobile home Home Access: Ramped entrance     Home Layout: One level Home Equipment: Environmental consultant - 2 wheels;Bedside commode;Shower seat      Prior Function Level of Independence: Needs assistance   Gait / Transfers Assistance Needed: Patient reports she could ambulate with rolling walker without physical assistance for mobility at baseline  ADL's / Homemaking Assistance Needed: Patient required assistance from her daughter for all ADLs per her report        Hand Dominance        Extremity/Trunk Assessment  Upper Extremity Assessment Upper Extremity Assessment: Generalized weakness    Lower Extremity Assessment Lower Extremity Assessment: Generalized weakness (limited right knee flexion AROM at baseline due to previous surgery per patient report)       Communication      Cognition Arousal/Alertness: Awake/alert Behavior During Therapy:  WFL for tasks assessed/performed Overall Cognitive Status: Within Functional Limits for tasks assessed                                 General Comments: patient is grossly oriented and following all single step commands without difficulty      General Comments      Exercises     Assessment/Plan    PT Assessment Patient needs continued PT services  PT Problem List Decreased strength;Decreased range of motion;Decreased activity tolerance;Decreased balance;Decreased mobility;Decreased safety awareness       PT Treatment Interventions Gait training;DME instruction;Functional mobility training;Therapeutic activities;Therapeutic exercise;Balance training;Neuromuscular re-education;Patient/family education    PT Goals (Current goals can be found in the Care Plan section)  Acute Rehab PT Goals Patient Stated Goal: to go home PT Goal Formulation: With patient Time For Goal Achievement: 02/11/21 Potential to Achieve Goals: Fair    Frequency Min 2X/week   Barriers to discharge        Co-evaluation               AM-PAC PT "6 Clicks" Mobility  Outcome Measure Help needed turning from your back to your side while in a flat bed without using bedrails?: A Lot Help needed moving from lying on your back to sitting on the side of a flat bed without using bedrails?: A Lot Help needed moving to and from a bed to a chair (including a wheelchair)?: A Lot Help needed standing up from a chair using your arms (e.g., wheelchair or bedside chair)?: A Lot Help needed to walk in hospital room?: A Lot Help needed climbing 3-5 steps with a railing? : Total 6 Click Score: 11    End of Session   Activity Tolerance: Patient limited by fatigue Patient left: in bed;with call bell/phone within reach;with bed alarm set Nurse Communication: Mobility status PT Visit Diagnosis: Muscle weakness (generalized) (M62.81);History of falling (Z91.81);Unsteadiness on feet (R26.81)    Time:  3086-5784 PT Time Calculation (min) (ACUTE ONLY): 29 min   Charges:   PT Evaluation $PT Eval Moderate Complexity: 1 Mod PT Treatments $Therapeutic Activity: 8-22 mins        Donna Bernard, PT, MPT  Cynthia Jones 01/28/2021, 9:55 AM

## 2021-01-28 NOTE — Progress Notes (Signed)
Chief Complaint: Patient was seen in consultation today for diverticular abscess  Referring Physician(s): Dr. Osvaldo Shipper  Supervising Physician: Marliss Coots  Patient Status: [facility] - In-pt  History of Present Illness: Cynthia Jones is a 80 y.o. female admitted with abdominal pain and fever. Her imaging workup finds evidence of diverticulitis with abscess. Surgery was consulted. IR is asked to eval for consideration of percutaneous drainage to avoid major abd surgery. PMHx, meds, labs, imaging, allergies reviewed. Feels okay, some better since admission.     Past Medical History:  Diagnosis Date  . Acute anxiety 03/06/2015  . Allergy   . Anxiety   . Degenerative joint disease of knee   . Depression   . Fibromyalgia   . Obesity   . Osteopenia   . RAD (reactive airway disease)     Past Surgical History:  Procedure Laterality Date  . ABDOMINAL HYSTERECTOMY    . APPENDECTOMY    . BREAST CYST ASPIRATION Right 04/18/2013   neg  . CHOLECYSTECTOMY    . EYE SURGERY    . JOINT REPLACEMENT  2014   right knee  . PAROTIDECTOMY Left   . TONSILLECTOMY      Allergies: Nsaids and Erythromycin  Medications:  Current Facility-Administered Medications:  .  acetaminophen (TYLENOL) tablet 650 mg, 650 mg, Oral, Q6H PRN, 650 mg at 01/28/21 0345 **OR** acetaminophen (TYLENOL) suppository 650 mg, 650 mg, Rectal, Q6H PRN, Allena Katz, Vishal R, MD .  carbidopa-levodopa (SINEMET IR) 25-100 MG per tablet immediate release 1 tablet, 1 tablet, Oral, QID, Charlsie Quest, MD, 1 tablet at 01/28/21 1243 .  cefTRIAXone (ROCEPHIN) 1 g in sodium chloride 0.9 % 100 mL IVPB, 1 g, Intravenous, Q24H, Charlsie Quest, MD, Stopped at 01/28/21 [phone] .  DULoxetine (CYMBALTA) DR capsule 60 mg, 60 mg, Oral, Daily, Osvaldo Shipper, MD, 60 mg at 01/28/21 1245 .  [START ON 01/30/2021] enoxaparin (LOVENOX) injection 52.5 mg, 0.5 mg/kg, Subcutaneous, Q24H, Rito Ehrlich, Gokul, MD .  insulin aspart (novoLOG)  injection 0-5 Units, 0-5 Units, Subcutaneous, QHS, Patel, Vishal R, MD .  insulin aspart (novoLOG) injection 0-9 Units, 0-9 Units, Subcutaneous, TID WC, Charlsie Quest, MD, 2 Units at 01/28/21 1242 .  lactated ringers bolus 1,000 mL, 1,000 mL, Intravenous, Once **AND** lactated ringers bolus 500 mL, 500 mL, Intravenous, Once, Chesley Noon, MD .  levothyroxine (SYNTHROID) tablet 50 mcg, 50 mcg, Oral, Q0600, Charlsie Quest, MD, 50 mcg at 01/28/21 1914 .  losartan (COZAAR) tablet 100 mg, 100 mg, Oral, Daily, Osvaldo Shipper, MD .  metroNIDAZOLE (FLAGYL) IVPB 500 mg, 500 mg, Intravenous, Q8H, Osvaldo Shipper, MD .  mirtazapine (REMERON) tablet 7.5 mg, 7.5 mg, Oral, QHS, Patel, Vishal R, MD, 7.5 mg at 01/28/21 0240 .  ondansetron (ZOFRAN) tablet 4 mg, 4 mg, Oral, Q6H PRN **OR** ondansetron (ZOFRAN) injection 4 mg, 4 mg, Intravenous, Q6H PRN, Allena Katz, Vishal R, MD .  pregabalin (LYRICA) capsule 25 mg, 25 mg, Oral, Daily, Darreld Mclean R, MD, 25 mg at 01/28/21 0858 .  sodium chloride flush (NS) 0.9 % injection 3 mL, 3 mL, Intravenous, Q12H, Charlsie Quest, MD, 3 mL at 01/28/21 7829    Family History  Problem Relation Age of Onset  . Heart disease Mother   . Stroke Mother   . Diabetes Mother   . Alzheimer's disease Father   . Parkinson's disease Father   . Cancer Sister     Social History   Socioeconomic History  . Marital status: Widowed  Spouse name: Not on file  . Number of children: Not on file  . Years of education: Not on file  . Highest education level: 10th grade  Occupational History  . Not on file  Tobacco Use  . Smoking status: Never Smoker  . Smokeless tobacco: Never Used  Vaping Use  . Vaping Use: Never used  Substance and Sexual Activity  . Alcohol use: No    Alcohol/week: 0.0 standard drinks  . Drug use: No  . Sexual activity: Not on file  Other Topics Concern  . Not on file  Social History Narrative  . Not on file   Social Determinants of Health    Financial Resource Strain: Low Risk   . Difficulty of Paying Living Expenses: Not hard at all  Food Insecurity: No Food Insecurity  . Worried About Programme researcher, broadcasting/film/video in the Last Year: Never true  . Ran Out of Food in the Last Year: Never true  Transportation Needs: No Transportation Needs  . Lack of Transportation (Medical): No  . Lack of Transportation (Non-Medical): No  Physical Activity: Insufficiently Active  . Days of Exercise per Week: 7 days  . Minutes of Exercise per Session: 20 min  Stress: No Stress Concern Present  . Feeling of Stress : Not at all  Social Connections: Not on file    Review of Systems: A 12 point ROS discussed and pertinent positives are indicated in the HPI above.  All other systems are negative.  Review of Systems  Vital Signs: BP (!) 126/43 (BP Location: Left Arm)   Pulse 100   Temp 99.9 F (37.7 C) (Oral)   Resp 16   Ht 5' (1.524 m)   Wt 105.1 kg   SpO2 97%   BMI 45.25 kg/m   Physical Exam Constitutional:      Appearance: She is obese. She is not ill-appearing or toxic-appearing.  HENT:     Mouth/Throat:     Mouth: Mucous membranes are moist.     Pharynx: Oropharynx is clear.  Cardiovascular:     Rate and Rhythm: Normal rate and regular rhythm.     Heart sounds: Normal heart sounds.  Pulmonary:     Effort: Pulmonary effort is normal. No respiratory distress.     Breath sounds: Normal breath sounds.  Abdominal:     General: There is no distension.     Palpations: Abdomen is soft.     Tenderness: There is no abdominal tenderness.  Skin:    General: Skin is warm and dry.  Neurological:     General: No focal deficit present.     Mental Status: She is alert and oriented to person, place, and time.  Psychiatric:        Mood and Affect: Mood normal.        Thought Content: Thought content normal.        Judgment: Judgment normal.     Imaging: CT ABDOMEN PELVIS WO CONTRAST  Result Date: 01/28/2021 CLINICAL DATA:  Abdominal  pain and fever EXAM: CT ABDOMEN AND PELVIS WITHOUT CONTRAST TECHNIQUE: Multidetector CT imaging of the abdomen and pelvis was performed following the standard protocol without IV contrast. Oral contrast administered. COMPARISON:  None. FINDINGS: Lower chest: There is mild bibasilar atelectatic change. There are foci of coronary artery calcification. Hepatobiliary: There is hepatic steatosis. No focal liver lesions are appreciable on this noncontrast enhanced study. Gallbladder is absent. There is no appreciable biliary duct dilatation. Pancreas: There is no pancreatic mass or  inflammatory focus. Spleen: No splenic lesions are evident. Adrenals/Urinary Tract: Adrenals bilaterally appear normal. There is mild perinephric stranding bilaterally without fluid collection. There is no evident renal mass or hydronephrosis on either side. No appreciable renal or ureteral calculus on either side. Urinary bladder is midline with wall thickness within normal limits. Stomach/Bowel: There are multiple sigmoid diverticula. There is wall thickening in the mid sigmoid colon consistent with a degree of diverticulitis. There is a fluid collection containing air and mildly thickened wall which appears to arise from the sigmoid in the mid pelvic region immediately to the right of midline measuring 6.3 x 5.9 x 5.0 cm, an apparent peridiverticular abscess. No other similar changes. Elsewhere there is no bowel wall thickening or bowel obstruction. The terminal ileum appears normal. No periappendiceal region inflammation. No free air or portal venous air. Vascular/Lymphatic: There is aortic atherosclerosis. No abdominal aortic aneurysm. No evident adenopathy in the abdomen or pelvis. Reproductive: Uterus absent. No adnexal mass evident. Note that the diverticular abscess may abut right ovarian tissue. Other: There is an umbilical region hernia containing fat but no bowel. No evident ascites in the abdomen or pelvis. Musculoskeletal:  Degenerative changes noted in the lower thoracic and lumbar regions. There is evidence spinal stenosis at L2-3 due to bony hypertrophy and disc protrusion. Similar changes are noted at L3-4 and to a lesser degree at L4-5. No blastic or lytic bone lesions. No intramuscular lesions. Soft tissue thickening is noted in the right lateral abdominal region. IMPRESSION: 1. Mid to distal sigmoid diverticulitis. Apparent diverticular abscess arising posteriorly in the mid pelvic region slightly to the right of midline. This fluid collection which contains air and a mildly thickened wall appears to arise from the sigmoid colon. This apparent abscess measures 6.3 x 5.9 x 5.0 cm. 2. No bowel obstruction. No other abscess in the abdomen or pelvis. No evident free air. No periappendiceal region inflammation. 3. Soft tissue stranding in each perinephric region without fluid collection or evidence of renal abscess. No renal or ureteral calculi on either side. No hydronephrosis. Urinary bladder wall thickness normal. 4.  Umbilical hernia containing fat but no bowel. 5. Aortic Atherosclerosis (ICD10-I70.0). There are also foci of coronary artery calcification. 6. Spinal stenosis at L2-3 and to a lesser degree at L3-4 and L4-5 due to disc protrusion and bony hypertrophy. 7.  Hepatic steatosis. 8. Soft tissue thickening in the lateral right abdominal wall which potentially may represent resolving hematoma. No well-defined fluid collection in this area. These results will be called to the ordering clinician or representative by the Radiologist Assistant, and communication documented in the PACS or [employer]. Electronically Signed   By: Bretta Bang III M.D.   On: 01/28/2021 14:09   CT Head Wo Contrast  Result Date: 01/27/2021 CLINICAL DATA:  Head trauma fell from chair EXAM: CT HEAD WITHOUT CONTRAST TECHNIQUE: Contiguous axial images were obtained from the base of the skull through the vertex without intravenous contrast.  COMPARISON:  CT brain 11/20/2019 FINDINGS: Brain: No acute territorial infarction, hemorrhage, or intracranial mass. Mild atrophy. Patchy white matter hypodensity consistent with chronic small vessel ischemic change. Nonenlarged ventricles Vascular: No hyperdense vessels.  Carotid vascular calcification Skull: Normal. Negative for fracture or focal lesion. Sinuses/Orbits: No acute finding. Lobulated mucosal thickening in the sinuses Other: None IMPRESSION: 1. No CT evidence for acute intracranial abnormality. 2. Atrophy and mild chronic small vessel ischemic change of the white matter Electronically Signed   By: Adrian Prows.D.  On: 01/27/2021 23:22   DG Chest Port 1 View  Result Date: 01/27/2021 CLINICAL DATA:  Weakness, fell from chair, fever, lower abdominal pain EXAM: PORTABLE CHEST 1 VIEW COMPARISON:  07/09/2016 FINDINGS: The heart size and mediastinal contours are within normal limits. Both lungs are clear. The visualized skeletal structures are unremarkable. IMPRESSION: No active disease. Electronically Signed   By: Sharlet Salina M.D.   On: 01/27/2021 22:02    Labs:  CBC: Recent Labs    01/27/21 2148 01/28/21 0431  WBC 21.2* 26.5*  HGB 13.4 12.1  HCT 38.7 35.0*  PLT 275 249    COAGS: Recent Labs    01/27/21 2148  INR 1.1  APTT 45*    BMP: Recent Labs    03/01/20 1639 01/27/21 2148 01/28/21 0431  NA 140 133* 134*  K 4.4 3.4* 3.0*  CL 103 99 101  CO2 21 20* 21*  GLUCOSE 106* 164* 135*  BUN 17 9 9   CALCIUM 9.4 8.7* 8.2*  CREATININE 0.89 0.76 0.80  GFRNONAA 63 >60 >60  GFRAA 72  --   --     LIVER FUNCTION TESTS: Recent Labs    03/01/20 1639 01/27/21 2148 01/28/21 0431  BILITOT 0.2 1.0 1.1  AST 17 20 17   ALT 15 6 10   ALKPHOS 51 42 38  PROT 7.2 7.6 6.5  ALBUMIN 4.1 3.4* 3.0*    TUMOR MARKERS: No results for input(s): AFPTM, CEA, CA199, CHROMGRNA in the last 8760 hours.  Assessment and Plan: Diverticulitis with low abdominal/pelvic  abscess. Reviewed imaging, amenable to perc drain via transgluteal approach. Labs reviewed. Plan for procedure tomorrow. NPO p MN Holding Lovenox until post-procedure Risks and benefits discussed with the patient including bleeding, infection, damage to adjacent structures, bowel perforation/fistula connection, and sepsis.  All of the patient's questions were answered, patient is agreeable to proceed. Consent signed and in chart.    Thank you for this interesting consult.  I greatly enjoyed meeting Cynthia Jones and look forward to participating in their care.  A copy of this report was sent to the requesting provider on this date.  Electronically Signed: Brayton El, PA-C 01/28/2021, 3:35 PM   I spent a total of 20 minutes in face to face in clinical consultation, greater than 50% of which was counseling/coordinating care for perc abscess drain

## 2021-01-28 NOTE — Progress Notes (Signed)
PHARMACY - PHYSICIAN COMMUNICATION CRITICAL VALUE ALERT - BLOOD CULTURE IDENTIFICATION (BCID)  Cynthia Jones is an 80 y.o. female who presented to [facility] on 01/27/2021 with a chief complaint of weakness, dysuria, and fever.  Assessment:  1/4 bottles(anaerobic only) Staph Species, Staph Epidermidis  Name of physician (or Provider) Contacted: Jaquita Folds  Current antibiotics: Rocephin, Flagyl  Changes to prescribed antibiotics recommended:  Patient with Diverticulitis with abscess. Changing antibiotics to Zosyn. Will await susceptibilities.  Results for orders placed or performed during the hospital encounter of 01/27/21  Blood Culture ID Panel (Reflexed) (Collected: 01/27/2021 10:33 PM)  Result Value Ref Range   Enterococcus faecalis NOT DETECTED NOT DETECTED   Enterococcus Faecium NOT DETECTED NOT DETECTED   Listeria monocytogenes NOT DETECTED NOT DETECTED   Staphylococcus species DETECTED (A) NOT DETECTED   Staphylococcus aureus (BCID) NOT DETECTED NOT DETECTED   Staphylococcus epidermidis DETECTED (A) NOT DETECTED   Staphylococcus lugdunensis NOT DETECTED NOT DETECTED   Streptococcus species NOT DETECTED NOT DETECTED   Streptococcus agalactiae NOT DETECTED NOT DETECTED   Streptococcus pneumoniae NOT DETECTED NOT DETECTED   Streptococcus pyogenes NOT DETECTED NOT DETECTED   A.calcoaceticus-baumannii NOT DETECTED NOT DETECTED   Bacteroides fragilis NOT DETECTED NOT DETECTED   Enterobacterales NOT DETECTED NOT DETECTED   Enterobacter cloacae complex NOT DETECTED NOT DETECTED   Escherichia coli NOT DETECTED NOT DETECTED   Klebsiella aerogenes NOT DETECTED NOT DETECTED   Klebsiella oxytoca NOT DETECTED NOT DETECTED   Klebsiella pneumoniae NOT DETECTED NOT DETECTED   Proteus species NOT DETECTED NOT DETECTED   Salmonella species NOT DETECTED NOT DETECTED   Serratia marcescens NOT DETECTED NOT DETECTED   Haemophilus influenzae NOT DETECTED NOT DETECTED   Neisseria  meningitidis NOT DETECTED NOT DETECTED   Pseudomonas aeruginosa NOT DETECTED NOT DETECTED   Stenotrophomonas maltophilia NOT DETECTED NOT DETECTED   Candida albicans NOT DETECTED NOT DETECTED   Candida auris NOT DETECTED NOT DETECTED   Candida glabrata NOT DETECTED NOT DETECTED   Candida krusei NOT DETECTED NOT DETECTED   Candida parapsilosis NOT DETECTED NOT DETECTED   Candida tropicalis NOT DETECTED NOT DETECTED   Cryptococcus neoformans/gattii NOT DETECTED NOT DETECTED   Methicillin resistance mecA/C NOT DETECTED NOT DETECTED    Sabastian Raimondi A Charles Andringa 01/28/2021  6:08 PM

## 2021-01-28 NOTE — Progress Notes (Addendum)
[facility] PROGRESS NOTE   Cynthia Jones OZD:[id number] DOB: July 19, 1941 DOA: 01/27/2021  PCP: Loura Pardon, MD  Brief History/Interval Summary: 80 y.o. female with medical history significant for Parkinson's disease, T2DM, HTN, hypothyroidism who presented to the ED for evaluation of weakness, dysuria, fever.    Patient was noted to have a temperature of 103 F.  Concern was for a urinary tract infection.  She was hospitalized for further management.  Started on antibiotics.  Reason for Visit: Sepsis possibly due to UTI  Consultants: None  Procedures: None  Antibiotics: Anti-infectives (From admission, onward)   Start     Dose/Rate Route Frequency Ordered Stop   01/28/21 0500  cefTRIAXone (ROCEPHIN) 1 g in sodium chloride 0.9 % 100 mL IVPB        1 g 200 mL/hr over 30 Minutes Intravenous Every 24 hours 01/28/21 0038     01/27/21 2215  ceFEPIme (MAXIPIME) 2 g in sodium chloride 0.9 % 100 mL IVPB  Status:  Discontinued        2 g 200 mL/hr over 30 Minutes Intravenous  Once 01/27/21 2206 01/27/21 2251   01/27/21 2215  metroNIDAZOLE (FLAGYL) IVPB 500 mg  Status:  Discontinued        500 mg 100 mL/hr over 60 Minutes Intravenous  Once 01/27/21 2206 01/28/21 0111   01/27/21 2215  vancomycin (VANCOCIN) IVPB 1000 mg/200 mL premix  Status:  Discontinued        1,000 mg 200 mL/hr over 60 Minutes Intravenous  Once 01/27/21 2206 01/27/21 2214   01/27/21 2215  vancomycin (VANCOREADY) IVPB 2000 mg/400 mL  Status:  Discontinued        2,000 mg 200 mL/hr over 120 Minutes Intravenous  Once 01/27/21 2214 01/28/21 0305      Subjective/Interval History: Patient was noted to be mildly distracted.  She does admit to burning sensation with urination.  Denies any blood in the urine.  Also admits to lower abdominal discomfort.  Denies any nausea vomiting.  No chest pain or shortness of breath.    Assessment/Plan:  Sepsis secondary to Acute Diverticulitis with abscess Patient did  mention dysuria and suprapubic tenderness.  She is tender in the lower abdomen.  However her UA was negative for leukocytes and nitrites.  Many bacteria noted but only 0-5 WBC.  Follow-up on urine culture.  Continue with ceftriaxone for now.  Follow-up on blood cultures.  Patient is tender in the lower abdomen.  We will proceed with CT scan of the abdomen and pelvis to make sure there is no alternative reason for her tenderness. No other source of infection has been found.  No skin rashes.  Chest x-ray does not show any pneumonia.  COVID-19 test was negative.  ADDENDUM CT of the abdomen pelvis raises concern for diverticulitis with abscess.  General surgery consulted.  May need to involve IR.  Will add Flagyl to the ceftriaxone.  Downgrade her diet. Daughter was updated.  Hyponatremia and hypokalemia and hypomagnesemia Likely due to poor oral intake.  We will replace her potassium.  Magnesium noted to be 1.5.  This will be repleted as well.  Diabetes mellitus type 2 Metformin on hold.  Continue SSI.  Monitor CBGs.  Essential hypertension Coreg and losartan placed on hold due to low blood pressures initially.  Continue to monitor.  Reinstitute depending on blood pressure trends.  Generalized weakness Most likely as result of acute infection.  PT and OT evaluation.  May need short-term rehab.  Hypothyroidism  Continue levothyroxine.  History of Parkinson's disease Continue Sinemet.  Uses walker to ambulate.  PT and OT evaluation.  Severe obesity Estimated body mass index is 45.25 kg/m as calculated from the following:   Height as of this encounter: 5' (1.524 m).   Weight as of this encounter: 105.1 kg.    DVT Prophylaxis: Lovenox Code Status: DNR Family Communication: Discussed with the patient.  No family at bedside Disposition Plan: PT has recommended SNF  Status is: Observation  The patient will require care spanning > 2 midnights and should be moved to inpatient because: IV  treatments appropriate due to intensity of illness or inability to take PO and Inpatient level of care appropriate due to severity of illness  Dispo: The patient is from: Home              Anticipated d/c is to: SNF              Patient currently is not medically stable to d/c.   Difficult to place patient No       Medications:  Scheduled: . carbidopa-levodopa  1 tablet Oral QID  . enoxaparin (LOVENOX) injection  0.5 mg/kg Subcutaneous Q24H  . insulin aspart  0-5 Units Subcutaneous QHS  . insulin aspart  0-9 Units Subcutaneous TID WC  . levothyroxine  50 mcg Oral Q0600  . mirtazapine  7.5 mg Oral QHS  . potassium chloride  40 mEq Oral Q4H  . pregabalin  25 mg Oral Daily  . sodium chloride flush  3 mL Intravenous Q12H   Continuous: . 0.9 % NaCl with KCl 40 mEq / L 100 mL/hr at 01/28/21 0347  . cefTRIAXone (ROCEPHIN)  IV 1 g (01/28/21 0558)  . lactated ringers     And  . lactated ringers     OZH:[id number] **OR** acetaminophen, ondansetron **OR** ondansetron (ZOFRAN) IV   Objective:  Vital Signs  Vitals:   01/28/21 0214 01/28/21 0508 01/28/21 0628 01/28/21 0738  BP: (!) 132/51 (!) 118/44  (!) 138/40  Pulse: 88 84  96  Resp: 18 16  20   Temp: (!) 100.6 F (38.1 C) 98.3 F (36.8 C) 98.8 F (37.1 C) 99.5 F (37.5 C)  TempSrc: Oral Oral Oral Oral  SpO2: 96% 95%  99%  Weight: 105.1 kg     Height: 5' (1.524 m)       Intake/Output Summary (Last 24 hours) at 01/28/2021 1038 Last data filed at 01/28/2021 0602 Gross per 24 hour  Intake --  Output 300 ml  Net -300 ml   Filed Weights   01/27/21 2145 01/28/21 0214  Weight: 104 kg 105.1 kg    General appearance: Awake alert.  In no distress.  Mildly distracted Resp: Clear to auscultation bilaterally.  Normal effort Cardio: S1-S2 is normal regular.  No S3-S4.  No rubs murmurs or bruit GI: Abdomen is soft.  Mildly tender in the suprapubic area without any rebound rigidity or guarding.  No masses  organomegaly Extremities: No edema.  Full range of motion of lower extremities. Neurologic:  No focal neurological deficits.    Lab Results:  Data Reviewed: I have personally reviewed following labs and imaging studies  CBC: Recent Labs  Lab 01/27/21 2148 01/28/21 0431  WBC 21.2* 26.5*  NEUTROABS 17.6*  --   HGB 13.4 12.1  HCT 38.7 35.0*  MCV 90.4 90.2  PLT 275 249    Basic Metabolic Panel: Recent Labs  Lab 01/27/21 2148 01/28/21 0431  NA 133* 134*  K  3.4* 3.0*  CL 99 101  CO2 20* 21*  GLUCOSE 164* 135*  BUN 9 9  CREATININE 0.76 0.80  CALCIUM 8.7* 8.2*  MG  --  1.5*    GFR: Estimated Creatinine Clearance: 63.4 mL/min (by C-G formula based on SCr of 0.8 mg/dL).  Liver Function Tests: Recent Labs  Lab 01/27/21 2148 01/28/21 0431  AST 20 17  ALT 6 10  ALKPHOS 42 38  BILITOT 1.0 1.1  PROT 7.6 6.5  ALBUMIN 3.4* 3.0*     Coagulation Profile: Recent Labs  Lab 01/27/21 2148  INR 1.1     CBG: Recent Labs  Lab 01/28/21 0225 01/28/21 0600 01/28/21 0844  GLUCAP 137* 113* 108*      Recent Results (from the past 240 hour(s))  Blood Culture (routine x 2)     Status: None (Preliminary result)   Collection Time: 01/27/21  9:48 PM   Specimen: BLOOD  Result Value Ref Range Status   Specimen Description BLOOD RIGHT AC  Final   Special Requests   Final    BOTTLES DRAWN AEROBIC AND ANAEROBIC Blood Culture results may not be optimal due to an excessive volume of blood received in culture bottles   Culture   Final    NO GROWTH < 12 HOURS Performed at [facility], [address] [place], [place] [address]    Report Status PENDING  Incomplete  Blood Culture (routine x 2)     Status: None (Preliminary result)   Collection Time: 01/27/21 10:33 PM   Specimen: BLOOD  Result Value Ref Range Status   Specimen Description BLOOD LEFT HAND  Final   Special Requests   Final    BOTTLES DRAWN AEROBIC AND ANAEROBIC Blood Culture results may not be  optimal due to an inadequate volume of blood received in culture bottles   Culture   Final    NO GROWTH < 12 HOURS Performed at [facility], [address], [place], [place] [address]    Report Status PENDING  Incomplete  Resp Panel by RT-PCR (Flu A&B, Covid) Nasopharyngeal Swab     Status: None   Collection Time: 01/27/21 10:34 PM   Specimen: Nasopharyngeal Swab; Nasopharyngeal(NP) swabs in vial transport medium  Result Value Ref Range Status   SARS Coronavirus 2 by RT PCR NEGATIVE NEGATIVE Final    Comment: (NOTE) SARS-CoV-2 target nucleic acids are NOT DETECTED.  The SARS-CoV-2 RNA is generally detectable in upper respiratory specimens during the acute phase of infection. The lowest concentration of SARS-CoV-2 viral copies this assay can detect is 138 copies/mL. A negative result does not preclude SARS-Cov-2 infection and should not be used as the sole basis for treatment or other patient management decisions. A negative result may occur with  improper specimen collection/handling, submission of specimen other than nasopharyngeal swab, presence of viral mutation(s) within the areas targeted by this assay, and inadequate number of viral copies(<138 copies/mL). A negative result must be combined with clinical observations, patient history, and epidemiological information. The expected result is Negative.  Fact Sheet for Patients:  BloggerCourse.com  Fact Sheet for Healthcare Providers:  SeriousBroker.it  This test is no t yet approved or cleared by the [place] FDA and  has been authorized for detection and/or diagnosis of SARS-CoV-2 by FDA under an Emergency Use Authorization (EUA). This EUA will remain  in effect (meaning this test can be used) for the duration of the COVID-19 declaration under Section 564(b)(1) of the Act, 21 U.S.C.section 360bbb-3(b)(1), unless  the authorization is terminated  or revoked  sooner.       Influenza A by PCR NEGATIVE NEGATIVE Final   Influenza B by PCR NEGATIVE NEGATIVE Final    Comment: (NOTE) The Xpert Xpress SARS-CoV-2/FLU/RSV plus assay is intended as an aid in the diagnosis of influenza from Nasopharyngeal swab specimens and should not be used as a sole basis for treatment. Nasal washings and aspirates are unacceptable for Xpert Xpress SARS-CoV-2/FLU/RSV testing.  Fact Sheet for Patients: BloggerCourse.com  Fact Sheet for Healthcare Providers: SeriousBroker.it  This test is not yet approved or cleared by the [place] FDA and has been authorized for detection and/or diagnosis of SARS-CoV-2 by FDA under an Emergency Use Authorization (EUA). This EUA will remain in effect (meaning this test can be used) for the duration of the COVID-19 declaration under Section 564(b)(1) of the Act, 21 U.S.C. section 360bbb-3(b)(1), unless the authorization is terminated or revoked.  Performed at [facility], [address], [place], [place] [address]       Radiology Studies: CT Head Wo Contrast  Result Date: 01/27/2021 CLINICAL DATA:  Head trauma fell from chair EXAM: CT HEAD WITHOUT CONTRAST TECHNIQUE: Contiguous axial images were obtained from the base of the skull through the vertex without intravenous contrast. COMPARISON:  CT brain 11/20/2019 FINDINGS: Brain: No acute territorial infarction, hemorrhage, or intracranial mass. Mild atrophy. Patchy white matter hypodensity consistent with chronic small vessel ischemic change. Nonenlarged ventricles Vascular: No hyperdense vessels.  Carotid vascular calcification Skull: Normal. Negative for fracture or focal lesion. Sinuses/Orbits: No acute finding. Lobulated mucosal thickening in the sinuses Other: None IMPRESSION: 1. No CT evidence for acute intracranial abnormality. 2. Atrophy and mild chronic small vessel ischemic change of the white matter  Electronically Signed   By: Jasmine Pang M.D.   On: 01/27/2021 23:22   DG Chest Port 1 View  Result Date: 01/27/2021 CLINICAL DATA:  Weakness, fell from chair, fever, lower abdominal pain EXAM: PORTABLE CHEST 1 VIEW COMPARISON:  07/09/2016 FINDINGS: The heart size and mediastinal contours are within normal limits. Both lungs are clear. The visualized skeletal structures are unremarkable. IMPRESSION: No active disease. Electronically Signed   By: Sharlet Salina M.D.   On: 01/27/2021 22:02       LOS: 0 days   Chery Giusto Rito Ehrlich  [facility] Pager on [email or web address]  01/28/2021, 10:38 AM

## 2021-01-29 ENCOUNTER — Inpatient Hospital Stay: Payer: Medicare HMO

## 2021-01-29 LAB — BASIC METABOLIC PANEL
Anion gap: 7 (ref 5–15)
BUN: 7 mg/dL — ABNORMAL LOW (ref 8–23)
CO2: 22 mmol/L (ref 22–32)
Calcium: 8.2 mg/dL — ABNORMAL LOW (ref 8.9–10.3)
Chloride: 103 mmol/L (ref 98–111)
Creatinine, Ser: 0.74 mg/dL (ref 0.44–1.00)
GFR, Estimated: >60 mL/min (ref >60)
Glucose, Bld: 116 mg/dL — ABNORMAL HIGH (ref 70–99)
Potassium: 3.7 mmol/L (ref 3.5–5.1)
Sodium: 132 mmol/L — ABNORMAL LOW (ref 135–145)

## 2021-01-29 LAB — URINE CULTURE: Culture: NO GROWTH

## 2021-01-29 LAB — GLUCOSE, CAPILLARY
Glucose-Capillary: 113 mg/dL — ABNORMAL HIGH (ref 70–99)
Glucose-Capillary: 110 mg/dL — ABNORMAL HIGH (ref 70–99)
Glucose-Capillary: 194 mg/dL — ABNORMAL HIGH (ref 70–99)
Glucose-Capillary: 171 mg/dL — ABNORMAL HIGH (ref 70–99)

## 2021-01-29 LAB — CBC
HCT: 34.1 % — ABNORMAL LOW (ref 36.0–46.0)
Hemoglobin: 11.6 g/dL — ABNORMAL LOW (ref 12.0–15.0)
MCH: 30.9 pg (ref 26.0–34.0)
MCHC: 34 g/dL (ref 30.0–36.0)
MCV: 90.9 fL (ref 80.0–100.0)
Platelets: 235 10*3/uL (ref 150–400)
RBC: 3.75 MIL/uL — ABNORMAL LOW (ref 3.87–5.11)
RDW: 14.7 % (ref 11.5–15.5)
WBC: 27 10*3/uL — ABNORMAL HIGH (ref 4.0–10.5)
nRBC: 0 % (ref 0.0–0.2)

## 2021-01-29 LAB — MAGNESIUM: Magnesium: 2.3 mg/dL (ref 1.7–2.4)

## 2021-01-29 MED ORDER — LACTATED RINGERS IV SOLN: INTRAVENOUS | Status: DC

## 2021-01-29 MED ORDER — MIDAZOLAM HCL 2 MG/2ML IJ SOLN
INTRAMUSCULAR | Status: AC
  Filled 2021-01-29: qty 2

## 2021-01-29 MED ORDER — HYDROCODONE-ACETAMINOPHEN 5-325 MG PO TABS
1.0000 | ORAL_TABLET | Freq: Four times a day (QID) | ORAL | Status: DC | PRN
  Administered 2021-01-29 – 2021-01-30 (×2): 1 via ORAL
  Filled 2021-01-29 (×2): qty 1

## 2021-01-29 MED ORDER — MIDAZOLAM HCL 2 MG/2ML IJ SOLN
INTRAMUSCULAR | Status: AC | PRN
  Administered 2021-01-29 (×2): 0.5 mg via INTRAVENOUS
  Administered 2021-01-29: 1 mg via INTRAVENOUS

## 2021-01-29 MED ORDER — FENTANYL CITRATE (PF) 100 MCG/2ML IJ SOLN
INTRAMUSCULAR | Status: AC | PRN
  Administered 2021-01-29: 25 ug via INTRAVENOUS
  Administered 2021-01-29: 50 ug via INTRAVENOUS
  Administered 2021-01-29: 25 ug via INTRAVENOUS

## 2021-01-29 MED ORDER — FENTANYL CITRATE (PF) 100 MCG/2ML IJ SOLN
INTRAMUSCULAR | Status: AC
  Filled 2021-01-29: qty 2

## 2021-01-29 NOTE — Procedures (Signed)
Interventional Radiology Procedure Note  Procedure: CT Guided Drainage of diverticular abscess  Complications: None  Estimated Blood Loss: < 10 mL  Findings: 10 Fr drain placed in deep pelvic diverticular abscess from RLQ approach with return of purulent fluid. Fluid sample sent for culture analysis. Drain attached to suction bulb drainage.  Will follow.  Jodi Marble. Fredia Sorrow, M.D Pager:  [phone]

## 2021-01-29 NOTE — Progress Notes (Signed)
Patient ID: Cynthia Jones, female   DOB: 1941/04/26, 80 y.o.   MRN: [id number]     SURGICAL PROGRESS NOTE   Hospital Day(s): 1.   Post op day(s):  [name]   Interval History: Patient seen and examined, no acute events or new complaints overnight. Patient reports feeling well.  She reports minimal abdominal pain.  No pain radiation.  No alleviating or aggravating factor.  Patient had percutaneous drainage of pericolonic abscess.  No sign of complication from procedure.  Vital signs in last 24 hours: [min-max] current  Temp:  [98.2 F (36.8 C)-100 F (37.8 C)] 98.9 F (37.2 C) (06/01 1259) Pulse Rate:  [84-103] 92 (06/01 1259) Resp:  [16-22] 18 (06/01 1259) BP: (114-160)/(51-66) 157/66 (06/01 1259) SpO2:  [93 %-98 %] 97 % (06/01 1259)     Height: 5' (152.4 cm) Weight: 105.1 kg BMI (Calculated): 45.25   Physical Exam:  Constitutional: alert, cooperative and no distress  Respiratory: breathing non-labored at rest  Cardiovascular: regular rate and sinus rhythm  Gastrointestinal: soft, non-tender, and non-distended  Labs:  CBC Latest Ref Rng & Units 01/29/2021 01/28/2021 01/27/2021  WBC 4.0 - 10.5 K/uL 27.0(H) 26.5(H) 21.2(H)  Hemoglobin 12.0 - 15.0 g/dL 11.6(L) 12.1 13.4  Hematocrit 36.0 - 46.0 % 34.1(L) 35.0(L) 38.7  Platelets 150 - 400 K/uL 235 249 275   CMP Latest Ref Rng & Units 01/29/2021 01/28/2021 01/27/2021  Glucose 70 - 99 mg/dL 732(K) 025(K) 270(W)  BUN 8 - 23 mg/dL 7(L) 9 9  Creatinine 2.37 - 1.00 mg/dL 6.28 3.15 1.76  Sodium 135 - 145 mmol/L 132(L) 134(L) 133(L)  Potassium 3.5 - 5.1 mmol/L 3.7 3.0(L) 3.4(L)  Chloride 98 - 111 mmol/L 103 101 99  CO2 22 - 32 mmol/L 22 21(L) 20(L)  Calcium 8.9 - 10.3 mg/dL 8.2(L) 8.2(L) 8.7(L)  Total Protein 6.5 - 8.1 g/dL - 6.5 7.6  Total Bilirubin 0.3 - 1.2 mg/dL - 1.1 1.0  Alkaline Phos 38 - 126 U/L - 38 42  AST 15 - 41 U/L - 17 20  ALT 0 - 44 U/L - 10 6    Imaging studies: I personally evaluated the images of the CT-guided drainage.   Drain in place.   Assessment/Plan:  80 y.o. female with completed diverticulitis with abscess s/p CT-guided percutaneous drainage today, complicated by pertinent comorbidities including Parkinson disease, type 2 diabetes mellitus, hypertension, hypothyroidism.  There is no sign of complication from procedure.  Patient tolerated procedure well.  Patient tolerated a clear liquid diet.  We will follow closely to see if she does not develop any abdominal pain.  We will also follow closely with labs and vital signs.  Agreed to continue current IV antibiotic therapy.  Patient encouraged to ambulate.  Gae Gallop, MD

## 2021-01-29 NOTE — Plan of Care (Signed)

## 2021-01-29 NOTE — NC FL2 (Signed)
[place] MEDICAID FL2 LEVEL OF CARE SCREENING TOOL     IDENTIFICATION  Patient Name: Cynthia Jones Birthdate: 1941-08-07 Sex: female Admission Date (Current Location): 01/27/2021  [place] and [place] Number:  Chiropodist and Address:  [facility], [address], [place], [place] [address]      Provider Number: [id number]  Attending Physician Name and Address:  Alba Cory, MD  Relative Name and Phone Number:  Lonie Peak (granddaughter) [phone]    Current Level of Care: Hospital Recommended Level of Care: Skilled Nursing Facility Prior Approval Number:    Date Approved/Denied:   PASRR Number: pending  Discharge Plan: SNF    Current Diagnoses: Patient Active Problem List   Diagnosis Date Noted  . Sepsis (HCC) 01/27/2021  . Hyperlipidemia associated with type 2 diabetes mellitus (HCC) 01/27/2021  . Parkinson disease (HCC) 09/25/2020  . DDD (degenerative disc disease), lumbosacral 07/10/2020  . Chronic lower extremity pain (Bilateral) 07/10/2020  . Other intervertebral disc degeneration, lumbar region 07/10/2020  . Change in voice 07/02/2020  . Difficulty walking 07/02/2020  . Tremor 07/02/2020  . Thyroiditis 12/19/2019  . Chronic knee pain (Left) 07/05/2019  . Neurogenic pain 11/01/2018  . Morbid obesity with BMI of 40.0-44.9, adult (HCC) 09/20/2018  . Hypothyroid 09/15/2018  . Vitamin D deficiency 09/05/2018  . Elevated C-reactive protein (CRP) 08/09/2018  . Elevated sed rate 08/09/2018  . Pharmacologic therapy 08/08/2018  . Disorder of skeletal system 08/08/2018  . Problems influencing health status 08/08/2018  . Contusion of foot 03/09/2018  . Advanced care planning/counseling discussion 04/12/2017  . Lumbar spondylosis 10/07/2016  . Hypercholesteremia 09/14/2016  . Long term prescription opiate use 09/08/2016  . Long term prescription benzodiazepine use 09/08/2016  . Chronic pain syndrome 09/08/2016   . Chronic low back pain (2ry area of Pain) (Bilateral) (L>R) 09/08/2016  . Chronic knee pain (1ry area of Pain) (Bilateral) (L>R) 09/08/2016  . Cervicogenic headache 09/08/2016  . Chronic hip pain (Bilateral) 09/08/2016  . Chronic hand pain (Bilateral) (R>L) 09/08/2016  . Chronic foot pain (Left) 09/08/2016  . Insomnia 09/17/2015  . Folliculitis 08/05/2015  . History of total knee replacement (Right) 04/17/2015  . Fibromyalgia 04/09/2015  . Bilateral occipital neuralgia 04/09/2015  . Greater trochanteric bursitis 04/09/2015  . Osteoarthritis of knee (Left) 04/09/2015  . Sacroiliac joint disease 04/09/2015  . Lumbar facet syndrome (Bilateral) (L>R) 04/09/2015  . Neuropathy due to secondary diabetes (HCC) 04/09/2015  . Osteopenia 03/06/2015  . Anxiety 03/06/2015  . Depression 03/06/2015  . Hypertension associated with diabetes (HCC) 03/06/2015  . Diabetes mellitus (HCC) 03/06/2015  . Osteoarthritis 03/06/2015  . Seasonal allergic rhinitis 03/06/2015  . Obesity 03/06/2015  . Intertrigo 03/06/2015    Orientation RESPIRATION BLADDER Height & Weight     Self,Time,Situation,Place  Normal Incontinent Weight: 105.1 kg Height:  5' (152.4 cm)  BEHAVIORAL SYMPTOMS/MOOD NEUROLOGICAL BOWEL NUTRITION STATUS      Incontinent Diet (see discharge summary)  AMBULATORY STATUS COMMUNICATION OF NEEDS Skin   Limited Assist Verbally Normal                       Personal Care Assistance Level of Assistance  Bathing,Feeding,Dressing Bathing Assistance: Limited assistance Feeding assistance: Limited assistance Dressing Assistance: Limited assistance     Functional Limitations Info             SPECIAL CARE FACTORS FREQUENCY  PT (By licensed PT),OT (By licensed OT)     PT Frequency: 5 times  per week OT Frequency: 5 times per week            Contractures Contractures Info: Not present    Additional Factors Info  Code Status,Allergies Code Status Info: DNR Allergies Info:  Nsaids, Erythromycin           Current Medications (01/29/2021):  This is the current hospital active medication list Current Facility-Administered Medications  Medication Dose Route Frequency Provider Last Rate Last Admin  . acetaminophen (TYLENOL) tablet 650 mg  650 mg Oral Q6H PRN Charlsie Quest, MD   650 mg at 01/28/21 0345   Or  . acetaminophen (TYLENOL) suppository 650 mg  650 mg Rectal Q6H PRN Charlsie Quest, MD      . carbidopa-levodopa (SINEMET IR) 25-100 MG per tablet immediate release 1 tablet  1 tablet Oral QID Charlsie Quest, MD   1 tablet at 01/29/21 1322  . DULoxetine (CYMBALTA) DR capsule 60 mg  60 mg Oral Daily Osvaldo Shipper, MD   60 mg at 01/29/21 0801  . [START ON 01/30/2021] enoxaparin (LOVENOX) injection 52.5 mg  0.5 mg/kg Subcutaneous Q24H Osvaldo Shipper, MD      . fentaNYL (SUBLIMAZE) 100 MCG/2ML injection           . HYDROcodone-acetaminophen (NORCO/VICODIN) 5-325 MG per tablet 1 tablet  1 tablet Oral Q6H PRN Regalado, Belkys A, MD   1 tablet at 01/29/21 1335  . insulin aspart (novoLOG) injection 0-5 Units  0-5 Units Subcutaneous QHS Patel, Vishal R, MD      . insulin aspart (novoLOG) injection 0-9 Units  0-9 Units Subcutaneous TID WC Charlsie Quest, MD   1 Units at 01/28/21 1724  . lactated ringers bolus 1,000 mL  1,000 mL Intravenous Once Chesley Noon, MD       And  . lactated ringers bolus 500 mL  500 mL Intravenous Once Chesley Noon, MD      . lactated ringers infusion   Intravenous Continuous Regalado, Prentiss Bells, MD   Stopped at 01/29/21 1127  . levothyroxine (SYNTHROID) tablet 50 mcg  50 mcg Oral Q0600 Charlsie Quest, MD   50 mcg at 01/28/21 5465  . midazolam (VERSED) 2 MG/2ML injection           . mirtazapine (REMERON) tablet 7.5 mg  7.5 mg Oral QHS Darreld Mclean R, MD   7.5 mg at 01/28/21 2121  . ondansetron (ZOFRAN) tablet 4 mg  4 mg Oral Q6H PRN Charlsie Quest, MD       Or  . ondansetron (ZOFRAN) injection 4 mg  4 mg Intravenous Q6H PRN Darreld Mclean R, MD      . piperacillin-tazobactam (ZOSYN) IVPB 3.375 g  3.375 g Intravenous Q8H Osvaldo Shipper, MD 12.5 mL/hr at 01/29/21 1503 Infusion Verify at 01/29/21 1503  . pregabalin (LYRICA) capsule 25 mg  25 mg Oral Daily Charlsie Quest, MD   25 mg at 01/29/21 0801  . sodium chloride flush (NS) 0.9 % injection 3 mL  3 mL Intravenous Q12H Charlsie Quest, MD   3 mL at 01/29/21 0802     Discharge Medications: Please see discharge summary for a list of discharge medications.  Relevant Imaging Results:  Relevant Lab Results:   Additional Information SS# [id number]  Allayne Butcher, RN

## 2021-01-29 NOTE — TOC Initial Note (Signed)
Transition of Care [facility]) - Initial/Assessment Note    Patient Details  Name: Cynthia Jones MRN: [id number] Date of Birth: 16-Feb-1941  Transition of [facility]) CM/SW Contact:    Shelbie Hutching, RN Phone Number: 01/29/2021, 3:03 PM  Clinical Narrative:                 Patient admitted to the hospital with sepsis.  RNCM met with patient and with patient's granddaughter at the bedside today.  Granddaughter Mateo Flow reports that patient lives with [name] (patient's daughter), and Mateo Flow lives very close by.  Mateo Flow is a Marine scientist and comes over 3 times a week or so to help patient with bathing.  Patient can get around at home with a walker.  Daughter, [name] does all the cooking and both family members help with transportation.  Patient is current with her PCP.  Family agrees that patient would benefit from SNF for short term rehab.  Granddaughter prefers [facility].  SNF workup started and bed request sent to [facility].  TOC will cont to follow.     Barriers to Discharge: Continued Medical Work up   Patient Goals and CMS Choice Patient states their goals for this hospitalization and ongoing recovery are:: Patient's granddaughter voices that patient will benefit from SNF for rehab and they would prefer [facility] Medicare.gov Compare Post Acute Care list provided to:: Patient Choice offered to / list presented to : [place]  Expected Discharge Plan and Services     Discharge Planning Services: CM Consult   Living arrangements for the past 2 months: Single Family Home                 DME Arranged: N/A DME Agency: NA       HH Arranged: NA          Prior Living Arrangements/Services Living arrangements for the past 2 months: Single Family Home Lives with:: Adult Children Patient language and need for interpreter reviewed:: Yes Do you feel safe going back to the place where you live?: Yes      Need for Family Participation in Patient Care: Yes  (Comment) (sepsis) Care giver support system in place?: Yes (comment) (daughter and granddaughter) Current home services: DME (walker) Criminal Activity/Legal Involvement Pertinent to Current Situation/Hospitalization: No - Comment as needed  Activities of Daily Living Home Assistive Devices/Equipment: Environmental consultant (specify type) ADL Screening (condition at time of admission) Patient's cognitive ability adequate to safely complete daily activities?: Yes Is the patient deaf or have difficulty hearing?: No Does the patient have difficulty seeing, even when wearing glasses/contacts?: No Does the patient have difficulty concentrating, remembering, or making decisions?: No Patient able to express need for assistance with ADLs?: Yes Does the patient have difficulty dressing or bathing?: No Independently performs ADLs?: No Communication: Needs assistance Is this a change from baseline?: Pre-admission baseline Dressing (OT): Needs assistance Is this a change from baseline?: Pre-admission baseline Grooming: Needs assistance Is this a change from baseline?: Pre-admission baseline Feeding: Independent Bathing: Needs assistance Is this a change from baseline?: Pre-admission baseline Toileting: Needs assistance Is this a change from baseline?: Pre-admission baseline Walks in Home: Needs assistance Is this a change from baseline?: Pre-admission baseline Does the patient have difficulty walking or climbing stairs?: Yes Weakness of Legs: Both Weakness of Arms/Hands: None  Permission Sought/Granted Permission sought to share information with : Case Manager,Facility Contact Representative Permission granted to share information with : Yes, Verbal Permission Granted  Share Information with NAME: Mateo Flow and  Christine  Permission granted to share info w AGENCY: [employer] spatial [employer] SNFs  Permission granted to share info w Relationship: granddaughter and daughter     Emotional  Assessment Appearance:: Appears stated age Attitude/Demeanor/Rapport: Engaged Affect (typically observed): Accepting Orientation: : Oriented to Self,Oriented to Place,Oriented to  Time,Oriented to Situation Alcohol / Substance Use: Not Applicable Psych Involvement: No (comment)  Admission diagnosis:  Generalized weakness [R53.1] Fall, initial encounter [W19.XXXA] Sepsis ([place]) [A41.9] Sepsis without acute organ dysfunction, due to unspecified organism [facility]) [A41.9] Patient Active Problem List   Diagnosis Date Noted  . Sepsis ([place]) 01/27/2021  . Hyperlipidemia associated with type 2 diabetes mellitus ([place]) 01/27/2021  . Parkinson disease ([place]) 09/25/2020  . DDD (degenerative disc disease), lumbosacral 07/10/2020  . Chronic lower extremity pain (Bilateral) 07/10/2020  . Other intervertebral disc degeneration, lumbar region 07/10/2020  . Change in voice 07/02/2020  . Difficulty walking 07/02/2020  . Tremor 07/02/2020  . Thyroiditis 12/19/2019  . Chronic knee pain (Left) 07/05/2019  . Neurogenic pain 11/01/2018  . Morbid obesity with BMI of 40.0-44.9, adult ([place]) 09/20/2018  . Hypothyroid 09/15/2018  . Vitamin D deficiency 09/05/2018  . Elevated C-reactive protein (CRP) 08/09/2018  . Elevated sed rate 08/09/2018  . Pharmacologic therapy 08/08/2018  . Disorder of skeletal system 08/08/2018  . Problems influencing health status 08/08/2018  . Contusion of foot 03/09/2018  . Advanced care planning/counseling discussion 04/12/2017  . Lumbar spondylosis 10/07/2016  . Hypercholesteremia 09/14/2016  . Long term prescription opiate use 09/08/2016  . Long term prescription benzodiazepine use 09/08/2016  . Chronic pain syndrome 09/08/2016  . Chronic low back pain (2ry area of Pain) (Bilateral) (L>R) 09/08/2016  . Chronic knee pain (1ry area of Pain) (Bilateral) (L>R) 09/08/2016  . Cervicogenic headache 09/08/2016  . Chronic hip pain (Bilateral) 09/08/2016  . Chronic hand pain (Bilateral)  (R>L) 09/08/2016  . Chronic foot pain (Left) 09/08/2016  . Insomnia 09/17/2015  . Folliculitis 89/37/3428  . History of total knee replacement (Right) 04/17/2015  . Fibromyalgia 04/09/2015  . Bilateral occipital neuralgia 04/09/2015  . Greater trochanteric bursitis 04/09/2015  . Osteoarthritis of knee (Left) 04/09/2015  . Sacroiliac joint disease 04/09/2015  . Lumbar facet syndrome (Bilateral) (L>R) 04/09/2015  . Neuropathy due to secondary diabetes ([place]) 04/09/2015  . Osteopenia 03/06/2015  . Anxiety 03/06/2015  . Depression 03/06/2015  . Hypertension associated with diabetes ([place]) 03/06/2015  . Diabetes mellitus ([place]) 03/06/2015  . Osteoarthritis 03/06/2015  . Seasonal allergic rhinitis 03/06/2015  . Obesity 03/06/2015  . Intertrigo 03/06/2015   PCP:  Charlynne Cousins, MD Pharmacy:   [id number] - [place] [place] - [address] [address] Phone: [phone] Fax: [phone]     Social Determinants of Health (SDOH) Interventions    Readmission Risk Interventions No flowsheet data found.

## 2021-01-29 NOTE — Progress Notes (Signed)
Patient clinically stable post Abscess drain placement per Dr Roxy Manns 10FR, with purulent material removed,with sample sent to lab. Tolerated well with stable vitals pre and post procedure. Denies complaints at this time. Report given to care nurse, with granddaughter at bedside. Received Versed 2 mg along with Fentanyl 100 mcg IV for procedure.

## 2021-01-29 NOTE — Evaluation (Signed)
Occupational Therapy Evaluation Patient Details Name: Cynthia Jones MRN: [id number] DOB: 1941-06-20 Today's Date: 01/29/2021    History of Present Illness Patient is a 80 y.o. female with medical history significant for Parkinson's disease, T2DM, HTN, hypothyroidism who presents to the ED for evaluation of weakness, dysuria, fever. Patient being evaluated for sepsis. Patient with recent fall from chair at home with head CT showing no evidence for acute intracranial abnormality.   Clinical Impression   Pt seen for OT evaluation this date in setting of acute hospitalization d/t fall. Pt reports living with her dtr in [facility] with ramped entrance and having granddtr who lives two houses down. Pt reports being MOD I with ADL transfers and fxl mobility at baseline and requiring some dressing/bathing help from granddtr while her dtr primarily tends to household IADLs including driving and getting groceries. Pt presents this date with gross weakness and general deconditioning impacting her ability to safely perform ADLs/ADL mobility at baseline. In addition, pt appears somewhat fearful of mobilizing/falling again. Pt able to roll in bed with MOD A and demos 3+/5 grip strength bilaterally. Pt requires at least SETUP for bed level UB ADLs including feeding and g/h tasks d/t weakened grip (pt reports these are typically ADLs she can do without assistance). Pt declines to get OOB with OT at this time citing fatigue and also endorses feeling somewhat anxious r/t procedure today. OT educates pt re: importance/benefits of OOB activity/therapy participation. Pt with good understanding. Will continue to follow. Anticipate pt will require STR upon d/c from hospital to regain strength and reduce fall risk as it pertains to the safe performance of ADLs/ADL mobility.    Follow Up Recommendations  SNF    Equipment Recommendations  3 in 1 bedside commode;Tub/shower seat;Other (comment) (2ww)    Recommendations for Other  Services       Precautions / Restrictions Precautions Precautions: Fall Restrictions Weight Bearing Restrictions: No      Mobility Bed Mobility Overal bed mobility: Needs Assistance Bed Mobility: Rolling Rolling: Mod assist         General bed mobility comments: increased time and cues for hand placement as well as assist with draw sheet to roll laterally    Transfers                 General transfer comment: deferred as pt politely decliens to come to sitting or standing at this time citing fatigue and feeling anxious related to anticipating surgery.    Balance                                           ADL either performed or assessed with clinical judgement   ADL Overall ADL's : Needs assistance/impaired                                       General ADL Comments: requires SETUP to MIN A for bed level UB ADLs in high-fowler's position (declines to come to EOB sitting with OT At this time). Pt requires MAX A for peri care/toileting wtih bed level rolling technique and TOTAL A to don socks     Vision Baseline Vision/History: Wears glasses Wears Glasses: At all times Patient Visual Report: No change from baseline       Perception  Praxis      Pertinent Vitals/Pain Pain Assessment: Faces Faces Pain Scale: Hurts a little bit Pain Location: "belly" Pain Descriptors / Indicators: Discomfort Pain Intervention(s): Limited activity within patient's tolerance;Monitored during session;Repositioned     Hand Dominance     Extremity/Trunk Assessment Upper Extremity Assessment Upper Extremity Assessment: Generalized weakness (grip strength grossly only 3+/5 limiting ability to open containers and wrappers.)   Lower Extremity Assessment Lower Extremity Assessment: Generalized weakness       Communication Communication Communication: No difficulties   Cognition Arousal/Alertness: Awake/alert Behavior During Therapy:  WFL for tasks assessed/performed Overall Cognitive Status: Within Functional Limits for tasks assessed                                 General Comments: somewhat delayed responses, but overall appropraite with commands   General Comments       Exercises Other Exercises Other Exercises: OT educates pt re: role of OT, importance of OOB Activity, strengthening to improve mobility and prevent falls. Pt with good understanding.   Shoulder Instructions      Home Living Family/patient expects to be discharged to:: Private residence Living Arrangements: Children Available Help at Discharge: Family Type of Home: Mobile home Home Access: Ramped entrance     Home Layout: One level     Bathroom Shower/Tub: Tub/shower unit         Home Equipment: Environmental consultant - 2 wheels;Bedside commode;Shower seat          Prior Functioning/Environment Level of Independence: Needs assistance  Gait / Transfers Assistance Needed: Patient reports she could ambulate with rolling walker without physical assistance for mobility at baseline. States that she was able to get OOB herself and reports no recent falls. ADL's / Homemaking Assistance Needed: Patient required assistance from her daughter bathing, dressing and some aspects of toileting occasionally. Pt was able to feed herself and perform g/h wiht MOD I in sitting            OT Problem List: Decreased strength;Decreased activity tolerance;Decreased knowledge of use of DME or AE      OT Treatment/Interventions: Self-care/ADL training;Therapeutic activities;Therapeutic exercise;Patient/family education;Balance training;DME and/or AE instruction    OT Goals(Current goals can be found in the care plan section) Acute Rehab OT Goals Patient Stated Goal: to go home OT Goal Formulation: With patient Time For Goal Achievement: 02/12/21 Potential to Achieve Goals: Fair ADL Goals Pt Will Perform Lower Body Dressing: with min assist;with  adaptive equipment;sit to/from stand (with LRAD for standing clothing mgt over hips) Pt Will Transfer to Toilet: with supervision;ambulating;bedside commode (with LRAD to [facility] at least ~15' away to improve tolerance for fxl HH distnaces) Pt Will Perform Toileting - Clothing Manipulation and hygiene: with supervision;sit to/from stand Pt/caregiver will Perform Home Exercise Program: Increased strength;Both right and left upper extremity;With Supervision  OT Frequency: Min 1X/week   Barriers to D/C:            Co-evaluation              AM-PAC OT "6 Clicks" Daily Activity     Outcome Measure Help from another person eating meals?: A Little Help from another person taking care of personal grooming?: A Little Help from another person toileting, which includes using toliet, bedpan, or urinal?: A Lot Help from another person bathing (including washing, rinsing, drying)?: A Lot Help from another person to put on and taking off regular upper body clothing?:  A Little Help from another person to put on and taking off regular lower body clothing?: A Lot 6 Click Score: 15   End of Session Equipment Utilized During Treatment: Gait belt;Rolling walker  Activity Tolerance: Patient tolerated treatment well Patient left: in bed;with bed alarm set;with call bell/phone within reach  OT Visit Diagnosis: Unsteadiness on feet (R26.81);Muscle weakness (generalized) (M62.81)                Time: 4098-1191 OT Time Calculation (min): 16 min Charges:  OT General Charges $OT Visit: 1 Visit OT Evaluation $OT Eval Moderate Complexity: 1 Mod OT Treatments $Self Care/Home Management : 8-22 mins  Rejeana Brock, MS, OTR/L ascom [phone] 01/29/21, 5:59 PM

## 2021-01-29 NOTE — Progress Notes (Signed)
PROGRESS NOTE    Cynthia Jones  UTM:[id number] DOB: [birth year] DOA: 01/27/2021 PCP: Loura Pardon, MD   Brief Narrative: 83 old with past medical history significant for Parkinson's disease, diabetes type 2, hypertension, hypothyroidism who presented to the ED for evaluation of weakness, dysuria and fever.  Patient was noted to have a temperature of 103.  She was hospitalized and started on IV antibiotics.  CT abdomen and pelvis: Raises concern for diverticulitis with abscess.  General surgery and IR consulted.  Patient underwent CT-guided drainage of diverticular abscess 10 Fr drain in placed.    Assessment & Plan:   Principal Problem:   Sepsis (HCC) Active Problems:   Hypertension associated with diabetes (HCC)   Diabetes mellitus (HCC)   Osteoarthritis   Neuropathy due to secondary diabetes (HCC)   Hypothyroid   Parkinson disease (HCC)   Hyperlipidemia associated with type 2 diabetes mellitus (HCC)   1-Sepsis secondary to acute diverticulitis with abscess: Patient presented with abdominal pain, CT abdomen and pelvis raises concern for diverticulitis with abscess. Neurosurgery and IR consulted. Continue with IV Zosyn  Underwent CT-guided drainage of diverticular abscess with a 10 Fr drain  2-Hyponatremia, hypokalemia and hypomagnesemia: Replete  Diabetes type 2: Continue to hold metformin, continue with sliding scale insulin  Hypertension: Coreg and losartan held due to initially low blood pressure. Monitor blood pressure  Generalized weakness: PT OT evaluation. Hypothyroidism: Continue with Synthroid History of Parkinson disease: Continue with Sinemet Severe obesity: Needs  lifestyle modification   Estimated body mass index is 45.25 kg/m as calculated from the following:   Height as of this encounter: 5' (1.524 m).   Weight as of this encounter: 105.1 kg.   DVT prophylaxis: Lovenox Code Status: DNR Family Communication: care discussed with granddaughter   Disposition Plan:  Status is: Inpatient  Remains inpatient appropriate because:IV treatments appropriate due to intensity of illness or inability to take PO   Dispo:  Patient From: Home  Planned Disposition: Skilled Nursing Facility  Medically stable for discharge: No          Consultants:   Surgery  IR  Procedures:   CT guide drain place for abscess  Antimicrobials:  On IV Zosyn  Subjective: She is alert and conversant, denies nausea or vomiting.  She reported lower quadrant abdominal pain.  Objective: Vitals:   01/28/21 2026 01/29/21 0027 01/29/21 0427 01/29/21 0714  BP: (!) 160/53 (!) 143/53 (!) 135/52 (!) 137/54  Pulse: 91 90 84 89  Resp: 18 18 (!) 22 18  Temp: 98.4 F (36.9 C) 98.2 F (36.8 C) 98.8 F (37.1 C) 100 F (37.8 C)  TempSrc: Oral Oral  Oral  SpO2: 95% 96% 96% 97%  Weight:      Height:        Intake/Output Summary (Last 24 hours) at 01/29/2021 0810 Last data filed at 01/28/2021 1654 Gross per 24 hour  Intake 1043.03 ml  Output 450 ml  Net 593.03 ml   Filed Weights   01/27/21 2145 01/28/21 0214  Weight: 104 kg 105.1 kg    Examination:  General exam: Appears calm and comfortable  Respiratory system: Clear to auscultation. Respiratory effort normal. Cardiovascular system: S1 & S2 heard, RRR. No JVD, murmurs, rubs, gallops or clicks. No pedal edema. Gastrointestinal system: Abdomen is nondistended, soft lower quadrant abdominal pain bilaterally Central nervous system: Alert and oriented. No focal neurological deficits. Extremities: Symmetric 5 x 5 power.   Data Reviewed: I have personally reviewed following labs and imaging studies  CBC: Recent Labs  Lab 01/27/21 2148 01/28/21 0431 01/29/21 0439  WBC 21.2* 26.5* 27.0*  NEUTROABS 17.6*  --   --   HGB 13.4 12.1 11.6*  HCT 38.7 35.0* 34.1*  MCV 90.4 90.2 90.9  PLT 275 249 235   Basic Metabolic Panel: Recent Labs  Lab 01/27/21 2148 01/28/21 0431 01/29/21 0439  NA 133*  134* 132*  K 3.4* 3.0* 3.7  CL 99 101 103  CO2 20* 21* 22  GLUCOSE 164* 135* 116*  BUN 9 9 7*  CREATININE 0.76 0.80 0.74  CALCIUM 8.7* 8.2* 8.2*  MG  --  1.5* 2.3   GFR: Estimated Creatinine Clearance: 63.4 mL/min (by C-G formula based on SCr of 0.74 mg/dL). Liver Function Tests: Recent Labs  Lab 01/27/21 2148 01/28/21 0431  AST 20 17  ALT 6 10  ALKPHOS 42 38  BILITOT 1.0 1.1  PROT 7.6 6.5  ALBUMIN 3.4* 3.0*   No results for input(s): LIPASE, AMYLASE in the last 168 hours. No results for input(s): AMMONIA in the last 168 hours. Coagulation Profile: Recent Labs  Lab 01/27/21 2148  INR 1.1   Cardiac Enzymes: No results for input(s): CKTOTAL, CKMB, CKMBINDEX, TROPONINI in the last 168 hours. BNP (last 3 results) No results for input(s): PROBNP in the last 8760 hours. HbA1C: No results for input(s): HGBA1C in the last 72 hours. CBG: Recent Labs  Lab 01/28/21 0844 01/28/21 1113 01/28/21 1719 01/28/21 2118 01/29/21 0715  GLUCAP 108* 167* 139* 139* 113*   Lipid Profile: No results for input(s): CHOL, HDL, LDLCALC, TRIG, CHOLHDL, LDLDIRECT in the last 72 hours. Thyroid Function Tests: No results for input(s): TSH, T4TOTAL, FREET4, T3FREE, THYROIDAB in the last 72 hours. Anemia Panel: No results for input(s): VITAMINB12, FOLATE, FERRITIN, TIBC, IRON, RETICCTPCT in the last 72 hours. Sepsis Labs: Recent Labs  Lab 01/27/21 2148 01/27/21 2208 01/28/21 0431  PROCALCITON  --   --  0.22  LATICACIDVEN 1.3 1.3  --     Recent Results (from the past 240 hour(s))  Blood Culture (routine x 2)     Status: None (Preliminary result)   Collection Time: 01/27/21  9:48 PM   Specimen: BLOOD  Result Value Ref Range Status   Specimen Description BLOOD RIGHT [facility]  Final   Special Requests   Final    BOTTLES DRAWN AEROBIC AND ANAEROBIC Blood Culture results may not be optimal due to an excessive volume of blood received in culture bottles   Culture   Final    NO GROWTH 2  DAYS Performed at [facility], [address], [place], [place] [address]    Report Status PENDING  Incomplete  Blood Culture (routine x 2)     Status: None (Preliminary result)   Collection Time: 01/27/21 10:33 PM   Specimen: BLOOD  Result Value Ref Range Status   Specimen Description BLOOD LEFT HAND  Final   Special Requests   Final    BOTTLES DRAWN AEROBIC AND ANAEROBIC Blood Culture results may not be optimal due to an inadequate volume of blood received in culture bottles   Culture  Setup Time   Final    Organism ID to follow GRAM POSITIVE COCCI ANAEROBIC BOTTLE ONLY CRITICAL RESULT CALLED TO, READ BACK BY AND VERIFIED WITH: Jill Poling 01/28/21 1736 KLW Performed at [facility], [address] [place], [place] [address]    Culture [facility] POSITIVE COCCI  Final   Report Status PENDING  Incomplete  Blood Culture ID Panel (  Reflexed)     Status: Abnormal   Collection Time: 01/27/21 10:33 PM  Result Value Ref Range Status   Enterococcus faecalis NOT DETECTED NOT DETECTED Final   Enterococcus Faecium NOT DETECTED NOT DETECTED Final   Listeria monocytogenes NOT DETECTED NOT DETECTED Final   Staphylococcus species DETECTED (A) NOT DETECTED Final    Comment: CRITICAL RESULT CALLED TO, READ BACK BY AND VERIFIED WITH: WALLY RANAZIRI 01/28/21 1736 KLW    Staphylococcus aureus (BCID) NOT DETECTED NOT DETECTED Final   Staphylococcus epidermidis DETECTED (A) NOT DETECTED Final    Comment: CRITICAL RESULT CALLED TO, READ BACK BY AND VERIFIED WITH: WALLY RANAZIRI 01/28/21 1736 KLW    Staphylococcus lugdunensis NOT DETECTED NOT DETECTED Final   Streptococcus species NOT DETECTED NOT DETECTED Final   Streptococcus agalactiae NOT DETECTED NOT DETECTED Final   Streptococcus pneumoniae NOT DETECTED NOT DETECTED Final   Streptococcus pyogenes NOT DETECTED NOT DETECTED Final   A.calcoaceticus-baumannii NOT DETECTED NOT DETECTED Final   Bacteroides fragilis NOT DETECTED NOT  DETECTED Final   Enterobacterales NOT DETECTED NOT DETECTED Final   Enterobacter cloacae complex NOT DETECTED NOT DETECTED Final   Escherichia coli NOT DETECTED NOT DETECTED Final   Klebsiella aerogenes NOT DETECTED NOT DETECTED Final   Klebsiella oxytoca NOT DETECTED NOT DETECTED Final   Klebsiella pneumoniae NOT DETECTED NOT DETECTED Final   Proteus species NOT DETECTED NOT DETECTED Final   Salmonella species NOT DETECTED NOT DETECTED Final   Serratia marcescens NOT DETECTED NOT DETECTED Final   Haemophilus influenzae NOT DETECTED NOT DETECTED Final   Neisseria meningitidis NOT DETECTED NOT DETECTED Final   Pseudomonas aeruginosa NOT DETECTED NOT DETECTED Final   Stenotrophomonas maltophilia NOT DETECTED NOT DETECTED Final   Candida albicans NOT DETECTED NOT DETECTED Final   Candida auris NOT DETECTED NOT DETECTED Final   Candida glabrata NOT DETECTED NOT DETECTED Final   Candida krusei NOT DETECTED NOT DETECTED Final   Candida parapsilosis NOT DETECTED NOT DETECTED Final   Candida tropicalis NOT DETECTED NOT DETECTED Final   Cryptococcus neoformans/gattii NOT DETECTED NOT DETECTED Final   Methicillin resistance mecA/C NOT DETECTED NOT DETECTED Final    Comment: Performed at [facility], [address] [place], [place] [address]  Resp Panel by RT-PCR (Flu A&B, Covid) Nasopharyngeal Swab     Status: None   Collection Time: 01/27/21 10:34 PM   Specimen: Nasopharyngeal Swab; Nasopharyngeal(NP) swabs in vial transport medium  Result Value Ref Range Status   SARS Coronavirus 2 by RT PCR NEGATIVE NEGATIVE Final    Comment: (NOTE) SARS-CoV-2 target nucleic acids are NOT DETECTED.  The SARS-CoV-2 RNA is generally detectable in upper respiratory specimens during the acute phase of infection. The lowest concentration of SARS-CoV-2 viral copies this assay can detect is 138 copies/mL. A negative result does not preclude SARS-Cov-2 infection and should not be used as the sole  basis for treatment or other patient management decisions. A negative result may occur with  improper specimen collection/handling, submission of specimen other than nasopharyngeal swab, presence of viral mutation(s) within the areas targeted by this assay, and inadequate number of viral copies(<138 copies/mL). A negative result must be combined with clinical observations, patient history, and epidemiological information. The expected result is Negative.  Fact Sheet for Patients:  BloggerCourse.com  Fact Sheet for Healthcare Providers:  SeriousBroker.it  This test is no t yet approved or cleared by the [place] FDA and  has been authorized for detection and/or diagnosis of SARS-CoV-2 by FDA  under an Emergency Use Authorization (EUA). This EUA will remain  in effect (meaning this test can be used) for the duration of the COVID-19 declaration under Section 564(b)(1) of the Act, 21 U.S.C.section 360bbb-3(b)(1), unless the authorization is terminated  or revoked sooner.       Influenza A by PCR NEGATIVE NEGATIVE Final   Influenza B by PCR NEGATIVE NEGATIVE Final    Comment: (NOTE) The Xpert Xpress SARS-CoV-2/FLU/RSV plus assay is intended as an aid in the diagnosis of influenza from Nasopharyngeal swab specimens and should not be used as a sole basis for treatment. Nasal washings and aspirates are unacceptable for Xpert Xpress SARS-CoV-2/FLU/RSV testing.  Fact Sheet for Patients: BloggerCourse.com  Fact Sheet for Healthcare Providers: SeriousBroker.it  This test is not yet approved or cleared by the [place] FDA and has been authorized for detection and/or diagnosis of SARS-CoV-2 by FDA under an Emergency Use Authorization (EUA). This EUA will remain in effect (meaning this test can be used) for the duration of the COVID-19 declaration under Section 564(b)(1) of the Act,  21 U.S.C. section 360bbb-3(b)(1), unless the authorization is terminated or revoked.  Performed at [facility], [address] [place], [place] [address]          Radiology Studies: CT ABDOMEN PELVIS WO CONTRAST  Result Date: 01/28/2021 CLINICAL DATA:  Abdominal pain and fever EXAM: CT ABDOMEN AND PELVIS WITHOUT CONTRAST TECHNIQUE: Multidetector CT imaging of the abdomen and pelvis was performed following the standard protocol without IV contrast. Oral contrast administered. COMPARISON:  None. FINDINGS: Lower chest: There is mild bibasilar atelectatic change. There are foci of coronary artery calcification. Hepatobiliary: There is hepatic steatosis. No focal liver lesions are appreciable on this noncontrast enhanced study. Gallbladder is absent. There is no appreciable biliary duct dilatation. Pancreas: There is no pancreatic mass or inflammatory focus. Spleen: No splenic lesions are evident. Adrenals/Urinary Tract: Adrenals bilaterally appear normal. There is mild perinephric stranding bilaterally without fluid collection. There is no evident renal mass or hydronephrosis on either side. No appreciable renal or ureteral calculus on either side. Urinary bladder is midline with wall thickness within normal limits. Stomach/Bowel: There are multiple sigmoid diverticula. There is wall thickening in the mid sigmoid colon consistent with a degree of diverticulitis. There is a fluid collection containing air and mildly thickened wall which appears to arise from the sigmoid in the mid pelvic region immediately to the right of midline measuring 6.3 x 5.9 x 5.0 cm, an apparent peridiverticular abscess. No other similar changes. Elsewhere there is no bowel wall thickening or bowel obstruction. The terminal ileum appears normal. No periappendiceal region inflammation. No free air or portal venous air. Vascular/Lymphatic: There is aortic atherosclerosis. No abdominal aortic aneurysm. No evident  adenopathy in the abdomen or pelvis. Reproductive: Uterus absent. No adnexal mass evident. Note that the diverticular abscess may abut right ovarian tissue. Other: There is an umbilical region hernia containing fat but no bowel. No evident ascites in the abdomen or pelvis. Musculoskeletal: Degenerative changes noted in the lower thoracic and lumbar regions. There is evidence spinal stenosis at L2-3 due to bony hypertrophy and disc protrusion. Similar changes are noted at L3-4 and to a lesser degree at L4-5. No blastic or lytic bone lesions. No intramuscular lesions. Soft tissue thickening is noted in the right lateral abdominal region. IMPRESSION: 1. Mid to distal sigmoid diverticulitis. Apparent diverticular abscess arising posteriorly in the mid pelvic region slightly to the right of midline. This fluid collection which contains  air and a mildly thickened wall appears to arise from the sigmoid colon. This apparent abscess measures 6.3 x 5.9 x 5.0 cm. 2. No bowel obstruction. No other abscess in the abdomen or pelvis. No evident free air. No periappendiceal region inflammation. 3. Soft tissue stranding in each perinephric region without fluid collection or evidence of renal abscess. No renal or ureteral calculi on either side. No hydronephrosis. Urinary bladder wall thickness normal. 4.  Umbilical hernia containing fat but no bowel. 5. Aortic Atherosclerosis (ICD10-I70.0). There are also foci of coronary artery calcification. 6. Spinal stenosis at L2-3 and to a lesser degree at L3-4 and L4-5 due to disc protrusion and bony hypertrophy. 7.  Hepatic steatosis. 8. Soft tissue thickening in the lateral right abdominal wall which potentially may represent resolving hematoma. No well-defined fluid collection in this area. These results will be called to the ordering clinician or representative by the Radiologist Assistant, and communication documented in the PACS or [employer]. Electronically Signed   By: Bretta Bang III M.D.   On: 01/28/2021 14:09   CT Head Wo Contrast  Result Date: 01/27/2021 CLINICAL DATA:  Head trauma fell from chair EXAM: CT HEAD WITHOUT CONTRAST TECHNIQUE: Contiguous axial images were obtained from the base of the skull through the vertex without intravenous contrast. COMPARISON:  CT brain 11/20/2019 FINDINGS: Brain: No acute territorial infarction, hemorrhage, or intracranial mass. Mild atrophy. Patchy white matter hypodensity consistent with chronic small vessel ischemic change. Nonenlarged ventricles Vascular: No hyperdense vessels.  Carotid vascular calcification Skull: Normal. Negative for fracture or focal lesion. Sinuses/Orbits: No acute finding. Lobulated mucosal thickening in the sinuses Other: None IMPRESSION: 1. No CT evidence for acute intracranial abnormality. 2. Atrophy and mild chronic small vessel ischemic change of the white matter Electronically Signed   By: Jasmine Pang M.D.   On: 01/27/2021 23:22   DG Chest Port 1 View  Result Date: 01/27/2021 CLINICAL DATA:  Weakness, fell from chair, fever, lower abdominal pain EXAM: PORTABLE CHEST 1 VIEW COMPARISON:  07/09/2016 FINDINGS: The heart size and mediastinal contours are within normal limits. Both lungs are clear. The visualized skeletal structures are unremarkable. IMPRESSION: No active disease. Electronically Signed   By: Sharlet Salina M.D.   On: 01/27/2021 22:02        Scheduled Meds: . carbidopa-levodopa  1 tablet Oral QID  . DULoxetine  60 mg Oral Daily  . [START ON 01/30/2021] enoxaparin (LOVENOX) injection  0.5 mg/kg Subcutaneous Q24H  . insulin aspart  0-5 Units Subcutaneous QHS  . insulin aspart  0-9 Units Subcutaneous TID WC  . levothyroxine  50 mcg Oral Q0600  . mirtazapine  7.5 mg Oral QHS  . pregabalin  25 mg Oral Daily  . sodium chloride flush  3 mL Intravenous Q12H   Continuous Infusions: . lactated ringers     And  . lactated ringers    . lactated ringers    . piperacillin-tazobactam  (ZOSYN)  IV 3.375 g (01/29/21 0507)     LOS: 1 day    Time spent: 35 minutes    Aureliano Oshields A Cyntha Brickman, MD [facility]   If 7PM-7AM, please contact night-coverage [email or web address]  01/29/2021, 8:10 AM

## 2021-01-29 NOTE — [place] Notes (Signed)
[facility] [employer] [facility]) Hospital Liaison note:  This patient is currently enrolled in [facility] out-patient Palliative Care services. Will continue to follow through disposition.  Please call with out-patient related Palliative Care questions.  Thank you,  Abran Cantor, LPN [facility] Liaison [phone]

## 2021-01-30 LAB — BASIC METABOLIC PANEL
Anion gap: 9 (ref 5–15)
BUN: 6 mg/dL — ABNORMAL LOW (ref 8–23)
CO2: 23 mmol/L (ref 22–32)
Calcium: 8.1 mg/dL — ABNORMAL LOW (ref 8.9–10.3)
Chloride: 103 mmol/L (ref 98–111)
Creatinine, Ser: 0.63 mg/dL (ref 0.44–1.00)
GFR, Estimated: >60 mL/min (ref >60)
Glucose, Bld: 113 mg/dL — ABNORMAL HIGH (ref 70–99)
Potassium: 3.5 mmol/L (ref 3.5–5.1)
Sodium: 135 mmol/L (ref 135–145)

## 2021-01-30 LAB — GLUCOSE, CAPILLARY
Glucose-Capillary: 105 mg/dL — ABNORMAL HIGH (ref 70–99)
Glucose-Capillary: 189 mg/dL — ABNORMAL HIGH (ref 70–99)
Glucose-Capillary: 158 mg/dL — ABNORMAL HIGH (ref 70–99)
Glucose-Capillary: 155 mg/dL — ABNORMAL HIGH (ref 70–99)

## 2021-01-30 LAB — CULTURE, BLOOD (ROUTINE X 2)

## 2021-01-30 LAB — CBC
HCT: 34.2 % — ABNORMAL LOW (ref 36.0–46.0)
Hemoglobin: 11.7 g/dL — ABNORMAL LOW (ref 12.0–15.0)
MCH: 31.2 pg (ref 26.0–34.0)
MCHC: 34.2 g/dL (ref 30.0–36.0)
MCV: 91.2 fL (ref 80.0–100.0)
Platelets: 284 10*3/uL (ref 150–400)
RBC: 3.75 MIL/uL — ABNORMAL LOW (ref 3.87–5.11)
RDW: 14.6 % (ref 11.5–15.5)
WBC: 21 10*3/uL — ABNORMAL HIGH (ref 4.0–10.5)
nRBC: 0 % (ref 0.0–0.2)

## 2021-01-30 LAB — MAGNESIUM: Magnesium: 2.1 mg/dL (ref 1.7–2.4)

## 2021-01-30 MED ORDER — HYDROCODONE-ACETAMINOPHEN 5-325 MG PO TABS
1.0000 | ORAL_TABLET | Freq: Four times a day (QID) | ORAL | Status: DC | PRN
  Administered 2021-01-30: 1 via ORAL
  Administered 2021-02-01: 12:00:00 2 via ORAL
  Filled 2021-01-30: qty 1
  Filled 2021-01-30: qty 2

## 2021-01-30 MED ORDER — SODIUM CHLORIDE 0.9% FLUSH
5.0000 mL | Freq: Three times a day (TID) | INTRAVENOUS | Status: DC
  Administered 2021-01-31 – 2021-02-02 (×5): 5 mL

## 2021-01-30 NOTE — TOC Progression Note (Signed)
Transition of Care [facility]) - Progression Note    Patient Details  Name: Cynthia Jones MRN: [id number] Date of Birth: 08/02/1941  [facility] CM/SW Contact  Shelbie Hutching, RN Phone Number: 01/30/2021, 3:31 PM  Clinical Narrative:    Per PT patient did really well today and could go home with family support and home health services.  RNCM met with patient at the bedside and she agrees with home health, she does not want to go to SNF for rehab.  Tommi Rumps with [facility] has accepted home health referral for RN, PT, OT and aide.     Expected Discharge Plan: [place] Barriers to Discharge: Continued Medical Work up  Expected Discharge Plan and Services Expected Discharge Plan: [place]   Discharge Planning Services: CM Consult Post Acute Care Choice: [place] arrangements for the past 2 months: Single Family Home                 DME Arranged: N/A DME Agency: NA       HH Arranged: RN,PT,OT,Nurse's Aide [place]: [place] Date [place]: 01/30/21 Time [place]: [phone] Representative spoke with at [place]: [place] ([place]) Interventions    Readmission Risk Interventions No flowsheet data found.

## 2021-01-30 NOTE — [place] Notes (Addendum)
Physical Therapy Treatment Patient Details Name: Cynthia Jones MRN: [id number] DOB: Feb 14, 1941 Today's Date: 01/30/2021    History of Present Illness Patient is a 80 y.o. female with medical history significant for Parkinson's disease, T2DM, HTN, hypothyroidism who presents to the ED for evaluation of weakness, dysuria, fever. Patient being evaluated for sepsis. Patient with recent fall from chair at home with head CT showing no evidence for acute intracranial abnormality.    PT Comments    Pt was supine in bed with HOB elevated ~ 30 degrees. She is alert and oriented and agreeable to PT session. Pt was motivated throughout. Required mod assist to exit bed however once EOB, was able to stand and ambulate with CGA only. Lengthy discussion about DC disposition. Pt has 24/7 care/assist at home and does not want rehab. Called pt's daughter and all parties agree that DC home once stable with HHPT is best plan. CM made aware. Pt has all equipment needs at home already.      Follow Up Recommendations  Supervision/Assistance - 24 hour;Supervision for mobility/OOB;Home health PT     Equipment Recommendations  None recommended by PT       Precautions / Restrictions Precautions Precautions: Fall Restrictions Weight Bearing Restrictions: No    Mobility  Bed Mobility Overal bed mobility: Needs Assistance Bed Mobility: Rolling Rolling: Min assist   Supine to sit: Mod assist Sit to supine: Mod assist        Transfers Overall transfer level: Needs assistance Equipment used: Rolling walker (2 wheeled) Transfers: Sit to/from Stand Sit to Stand: Min guard         General transfer comment: CGA for safety to stand from EOB and from recliner to RW  Ambulation/Gait Ambulation/Gait assistance: Min guard Gait Distance (Feet): 20 Feet Assistive device: Rolling walker (2 wheeled) Gait Pattern/deviations: Step-through pattern Gait velocity: decreased   General Gait Details: pt was  able to ambulate from EOB to doorway of room and return. Was limited by fatigue however demonstrated safe steady gait kinematics.       Balance Overall balance assessment: History of Falls;Needs assistance Sitting-balance support: Feet unsupported;Bilateral upper extremity supported Sitting balance-Leahy Scale: Good Sitting balance - Comments: no LOB   Standing balance support: Bilateral upper extremity supported;During functional activity Standing balance-Leahy Scale: Good Standing balance comment: no balance deficits with UE support         Cognition Arousal/Alertness: Awake/alert Behavior During Therapy: WFL for tasks assessed/performed Overall Cognitive Status: Within Functional Limits for tasks assessed      General Comments: Pt is A and O x 4. Very pleasant and motivated             Pertinent Vitals/Pain Pain Assessment: 0-10 Pain Score: 4  Faces Pain Scale: Hurts a little bit Pain Location: R hip/ L knee Pain Descriptors / Indicators: Discomfort Pain Intervention(s): Limited activity within patient's tolerance;Monitored during session;Premedicated before session;Repositioned           PT Goals (current goals can now be found in the care plan section) Acute Rehab PT Goals Patient Stated Goal: to go home Progress towards PT goals: Progressing toward goals    Frequency    Min 2X/week      PT Plan Discharge plan needs to be updated       AM-PAC PT "6 Clicks" Mobility   Outcome Measure  Help needed turning from your back to your side while in a flat bed without using bedrails?: A Little Help needed moving from lying on  your back to sitting on the side of a flat bed without using bedrails?: A Lot Help needed moving to and from a bed to a chair (including a wheelchair)?: A Little Help needed standing up from a chair using your arms (e.g., wheelchair or bedside chair)?: A Little Help needed to walk in hospital room?: A Little Help needed climbing 3-5  steps with a railing? : A Lot 6 Click Score: 16    End of Session Equipment Utilized During Treatment: Gait belt Activity Tolerance: Patient limited by fatigue Patient left: in chair;with call bell/phone within reach;with chair alarm set Nurse Communication: Mobility status PT Visit Diagnosis: Muscle weakness (generalized) (M62.81);History of falling (Z91.81);Unsteadiness on feet (R26.81)     Time: 3419-6222 PT Time Calculation (min) (ACUTE ONLY): 16 min  Charges:  $Therapeutic Activity: 8-22 mins                     Jetta Lout PTA 01/30/21, 12:43 PM

## 2021-01-30 NOTE — Progress Notes (Signed)
PROGRESS NOTE    Cynthia Jones  SLH:[id number] DOB: June 10, 1941 DOA: 01/27/2021 PCP: Loura Pardon, MD   Brief Narrative: 68 old with past medical history significant for Parkinson's disease, diabetes type 2, hypertension, hypothyroidism who presented to the ED for evaluation of weakness, dysuria and fever.  Patient was noted to have a temperature of 103.  She was hospitalized and started on IV antibiotics.  CT abdomen and pelvis: Raises concern for diverticulitis with abscess.  General surgery and IR consulted.  Patient underwent CT-guided drainage of diverticular abscess 10 Fr drain in placed.    Assessment & Plan:   Principal Problem:   Sepsis (HCC) Active Problems:   Hypertension associated with diabetes (HCC)   Diabetes mellitus (HCC)   Osteoarthritis   Neuropathy due to secondary diabetes (HCC)   Hypothyroid   Parkinson disease (HCC)   Hyperlipidemia associated with type 2 diabetes mellitus (HCC)   1-Sepsis secondary to acute diverticulitis with abscess: Patient presented with abdominal pain, CT abdomen and pelvis raises concern for diverticulitis with abscess. Neurosurgery and IR consulted. Continue with IV Zosyn  Underwent CT-guided drainage of diverticular abscess with a 10 Fr drain 6/01 Culture growing E coli.  Requiring opioid for pain.   2-Hyponatremia, hypokalemia and hypomagnesemia: Replete Resolved. Monitor.   Diabetes type 2: Continue to hold metformin, continue with sliding scale insulin.  Hypertension: Coreg and losartan held due to initially low blood pressure. Monitor blood pressure  Generalized weakness: PT OT evaluation. Needs SNF Hypothyroidism: Continue with Synthroid History of Parkinson disease: Continue with Sinemet Severe obesity: Needs  lifestyle modification   Estimated body mass index is 45.25 kg/m as calculated from the following:   Height as of this encounter: 5' (1.524 m).   Weight as of this encounter: 105.1 kg.   DVT  prophylaxis: Lovenox Code Status: DNR Family Communication: care discussed with granddaughter 6/01 Disposition Plan:  Status is: Inpatient  Remains inpatient appropriate because:IV treatments appropriate due to intensity of illness or inability to take PO   Dispo:  Patient From: Home  Planned Disposition: Skilled Nursing Facility  Medically stable for discharge: No          Consultants:   Surgery  IR  Procedures:   CT guide drain place for abscess  Antimicrobials:  On IV Zosyn  Subjective: Report abdominal pain. Denies nausea.   Objective: Vitals:   01/30/21 0413 01/30/21 0753 01/30/21 1100 01/30/21 1545  BP: (!) 149/53 (!) 160/59 (!) 154/63 (!) 117/49  Pulse: 85 82 87 86  Resp: 20 18 18 18   Temp: 98.7 F (37.1 C) 98 F (36.7 C) 98.2 F (36.8 C) 98.3 F (36.8 C)  TempSrc: Oral  Oral   SpO2: 96% 96% 96% 96%  Weight:      Height:        Intake/Output Summary (Last 24 hours) at 01/30/2021 1552 Last data filed at 01/30/2021 1343 Gross per 24 hour  Intake 340 ml  Output 1230 ml  Net -890 ml   Filed Weights   01/27/21 2145 01/28/21 0214  Weight: 104 kg 105.1 kg    Examination:  General exam: NAD Respiratory system: CTA Cardiovascular system: S 1, S 2 RRR Gastrointestinal system: BS present, soft, nt Central nervous system: alert Extremities: symmetric power   Data Reviewed: I have personally reviewed following labs and imaging studies  CBC: Recent Labs  Lab 01/27/21 2148 01/28/21 0431 01/29/21 0439 01/30/21 0409  WBC 21.2* 26.5* 27.0* 21.0*  NEUTROABS 17.6*  --   --   --  HGB 13.4 12.1 11.6* 11.7*  HCT 38.7 35.0* 34.1* 34.2*  MCV 90.4 90.2 90.9 91.2  PLT 275 249 235 284   Basic Metabolic Panel: Recent Labs  Lab 01/27/21 2148 01/28/21 0431 01/29/21 0439 01/30/21 0409  NA 133* 134* 132* 135  K 3.4* 3.0* 3.7 3.5  CL 99 101 103 103  CO2 20* 21* 22 23  GLUCOSE 164* 135* 116* 113*  BUN 9 9 7* 6*  CREATININE 0.76 0.80 0.74 0.63   CALCIUM 8.7* 8.2* 8.2* 8.1*  MG  --  1.5* 2.3 2.1   GFR: Estimated Creatinine Clearance: 63.4 mL/min (by C-G formula based on SCr of 0.63 mg/dL). Liver Function Tests: Recent Labs  Lab 01/27/21 2148 01/28/21 0431  AST 20 17  ALT 6 10  ALKPHOS 42 38  BILITOT 1.0 1.1  PROT 7.6 6.5  ALBUMIN 3.4* 3.0*   No results for input(s): LIPASE, AMYLASE in the last 168 hours. No results for input(s): AMMONIA in the last 168 hours. Coagulation Profile: Recent Labs  Lab 01/27/21 2148  INR 1.1   Cardiac Enzymes: No results for input(s): CKTOTAL, CKMB, CKMBINDEX, TROPONINI in the last 168 hours. BNP (last 3 results) No results for input(s): PROBNP in the last 8760 hours. HbA1C: No results for input(s): HGBA1C in the last 72 hours. CBG: Recent Labs  Lab 01/29/21 1308 01/29/21 1527 01/29/21 2027 01/30/21 0753 01/30/21 1146  GLUCAP 110* 194* 171* 105* 189*   Lipid Profile: No results for input(s): CHOL, HDL, LDLCALC, TRIG, CHOLHDL, LDLDIRECT in the last 72 hours. Thyroid Function Tests: No results for input(s): TSH, T4TOTAL, FREET4, T3FREE, THYROIDAB in the last 72 hours. Anemia Panel: No results for input(s): VITAMINB12, FOLATE, FERRITIN, TIBC, IRON, RETICCTPCT in the last 72 hours. Sepsis Labs: Recent Labs  Lab 01/27/21 2148 01/27/21 2208 01/28/21 0431  PROCALCITON  --   --  0.22  LATICACIDVEN 1.3 1.3  --     Recent Results (from the past 240 hour(s))  Blood Culture (routine x 2)     Status: None (Preliminary result)   Collection Time: 01/27/21  9:48 PM   Specimen: BLOOD  Result Value Ref Range Status   Specimen Description BLOOD RIGHT [facility]  Final   Special Requests   Final    BOTTLES DRAWN AEROBIC AND ANAEROBIC Blood Culture results may not be optimal due to an excessive volume of blood received in culture bottles   Culture   Final    NO GROWTH 2 DAYS Performed at [facility], [address] [place], [place] [address]    Report Status PENDING  Incomplete   Blood Culture (routine x 2)     Status: Abnormal   Collection Time: 01/27/21 10:33 PM   Specimen: BLOOD  Result Value Ref Range Status   Specimen Description   Final    BLOOD LEFT HAND Performed at [facility], [address] [place], [place] [address]    Special Requests   Final    BOTTLES DRAWN AEROBIC AND ANAEROBIC Blood Culture results may not be optimal due to an inadequate volume of blood received in culture bottles Performed at [facility] [address] [place], [place] [address]    Culture  Setup Time   Final    GRAM POSITIVE COCCI ANAEROBIC BOTTLE ONLY CRITICAL RESULT CALLED TO, READ BACK BY AND VERIFIED WITH: Koren Bound RANAZIRI 01/28/21 1736 KLW    Culture (A)  Final    STAPHYLOCOCCUS EPIDERMIDIS THE SIGNIFICANCE OF ISOLATING THIS ORGANISM FROM A  SINGLE SET OF BLOOD CULTURES WHEN MULTIPLE SETS ARE DRAWN IS UNCERTAIN. PLEASE NOTIFY THE MICROBIOLOGY DEPARTMENT WITHIN ONE WEEK IF SPECIATION AND SENSITIVITIES ARE REQUIRED. Performed at [facility], [address], [place], [place] [address]    Report Status 01/30/2021 FINAL  Final  Blood Culture ID Panel (Reflexed)     Status: Abnormal   Collection Time: 01/27/21 10:33 PM  Result Value Ref Range Status   Enterococcus faecalis NOT DETECTED NOT DETECTED Final   Enterococcus Faecium NOT DETECTED NOT DETECTED Final   Listeria monocytogenes NOT DETECTED NOT DETECTED Final   Staphylococcus species DETECTED (A) NOT DETECTED Final    Comment: CRITICAL RESULT CALLED TO, READ BACK BY AND VERIFIED WITH: WALLY RANAZIRI 01/28/21 1736 KLW    Staphylococcus aureus (BCID) NOT DETECTED NOT DETECTED Final   Staphylococcus epidermidis DETECTED (A) NOT DETECTED Final    Comment: CRITICAL RESULT CALLED TO, READ BACK BY AND VERIFIED WITH: WALLY RANAZIRI 01/28/21 1736 KLW    Staphylococcus lugdunensis NOT DETECTED NOT DETECTED Final   Streptococcus species NOT DETECTED NOT DETECTED Final   Streptococcus agalactiae  NOT DETECTED NOT DETECTED Final   Streptococcus pneumoniae NOT DETECTED NOT DETECTED Final   Streptococcus pyogenes NOT DETECTED NOT DETECTED Final   A.calcoaceticus-baumannii NOT DETECTED NOT DETECTED Final   Bacteroides fragilis NOT DETECTED NOT DETECTED Final   Enterobacterales NOT DETECTED NOT DETECTED Final   Enterobacter cloacae complex NOT DETECTED NOT DETECTED Final   Escherichia coli NOT DETECTED NOT DETECTED Final   Klebsiella aerogenes NOT DETECTED NOT DETECTED Final   Klebsiella oxytoca NOT DETECTED NOT DETECTED Final   Klebsiella pneumoniae NOT DETECTED NOT DETECTED Final   Proteus species NOT DETECTED NOT DETECTED Final   Salmonella species NOT DETECTED NOT DETECTED Final   Serratia marcescens NOT DETECTED NOT DETECTED Final   Haemophilus influenzae NOT DETECTED NOT DETECTED Final   Neisseria meningitidis NOT DETECTED NOT DETECTED Final   Pseudomonas aeruginosa NOT DETECTED NOT DETECTED Final   Stenotrophomonas maltophilia NOT DETECTED NOT DETECTED Final   Candida albicans NOT DETECTED NOT DETECTED Final   Candida auris NOT DETECTED NOT DETECTED Final   Candida glabrata NOT DETECTED NOT DETECTED Final   Candida krusei NOT DETECTED NOT DETECTED Final   Candida parapsilosis NOT DETECTED NOT DETECTED Final   Candida tropicalis NOT DETECTED NOT DETECTED Final   Cryptococcus neoformans/gattii NOT DETECTED NOT DETECTED Final   Methicillin resistance mecA/C NOT DETECTED NOT DETECTED Final    Comment: Performed at [facility], [address] [place], [place] [address]  Resp Panel by RT-PCR (Flu A&B, Covid) Nasopharyngeal Swab     Status: None   Collection Time: 01/27/21 10:34 PM   Specimen: Nasopharyngeal Swab; Nasopharyngeal(NP) swabs in vial transport medium  Result Value Ref Range Status   SARS Coronavirus 2 by RT PCR NEGATIVE NEGATIVE Final    Comment: (NOTE) SARS-CoV-2 target nucleic acids are NOT DETECTED.  The SARS-CoV-2 RNA is generally detectable in upper  respiratory specimens during the acute phase of infection. The lowest concentration of SARS-CoV-2 viral copies this assay can detect is 138 copies/mL. A negative result does not preclude SARS-Cov-2 infection and should not be used as the sole basis for treatment or other patient management decisions. A negative result may occur with  improper specimen collection/handling, submission of specimen other than nasopharyngeal swab, presence of viral mutation(s) within the areas targeted by this assay, and inadequate number of viral copies(<138 copies/mL). A negative result must be combined with clinical observations,  patient history, and epidemiological information. The expected result is Negative.  Fact Sheet for Patients:  BloggerCourse.com  Fact Sheet for Healthcare Providers:  SeriousBroker.it  This test is no t yet approved or cleared by the [place] FDA and  has been authorized for detection and/or diagnosis of SARS-CoV-2 by FDA under an Emergency Use Authorization (EUA). This EUA will remain  in effect (meaning this test can be used) for the duration of the COVID-19 declaration under Section 564(b)(1) of the Act, 21 U.S.C.section 360bbb-3(b)(1), unless the authorization is terminated  or revoked sooner.       Influenza A by PCR NEGATIVE NEGATIVE Final   Influenza B by PCR NEGATIVE NEGATIVE Final    Comment: (NOTE) The Xpert Xpress SARS-CoV-2/FLU/RSV plus assay is intended as an aid in the diagnosis of influenza from Nasopharyngeal swab specimens and should not be used as a sole basis for treatment. Nasal washings and aspirates are unacceptable for Xpert Xpress SARS-CoV-2/FLU/RSV testing.  Fact Sheet for Patients: BloggerCourse.com  Fact Sheet for Healthcare Providers: SeriousBroker.it  This test is not yet approved or cleared by the [place] FDA and has been  authorized for detection and/or diagnosis of SARS-CoV-2 by FDA under an Emergency Use Authorization (EUA). This EUA will remain in effect (meaning this test can be used) for the duration of the COVID-19 declaration under Section 564(b)(1) of the Act, 21 U.S.C. section 360bbb-3(b)(1), unless the authorization is terminated or revoked.  Performed at [facility], [address] [place], [place] [address]   Urine culture     Status: None   Collection Time: 01/28/21  6:01 AM   Specimen: Urine, Random  Result Value Ref Range Status   Specimen Description   Final    URINE, RANDOM Performed at [facility], [address], [place], [place] [address]    Special Requests   Final    NONE Performed at [facility], [address], [place], [place] [address]    Culture   Final    NO GROWTH Performed at [facility], [address] [place], [place] [address]    Report Status 01/29/2021 FINAL  Final  Aerobic/Anaerobic Culture (surgical/deep wound)     Status: None (Preliminary result)   Collection Time: 01/29/21 12:42 PM   Specimen: Abscess  Result Value Ref Range Status   Specimen Description   Final    ABSCESS Performed at [facility], [address], [place], [place] [address]    Special Requests   Final    Normal Performed at [facility], [address] [place], [place] [address]    Gram Stain   Final    ABUNDANT WBC PRESENT, PREDOMINANTLY PMN ABUNDANT GRAM NEGATIVE RODS ABUNDANT GRAM POSITIVE COCCI IN PAIRS IN CHAINS ABUNDANT GRAM POSITIVE RODS    Culture   Final    ABUNDANT ESCHERICHIA COLI CULTURE REINCUBATED FOR BETTER GROWTH SUSCEPTIBILITIES TO FOLLOW Performed at [facility], [address] [place], [place] [address]    Report Status PENDING  Incomplete         Radiology Studies: CT IMAGE GUIDED DRAINAGE BY PERCUTANEOUS CATHETER  Result Date: 01/29/2021 CLINICAL DATA:  Colonic diverticulitis  with abscess formation in the central pelvis posterior and inferior to the sigmoid colon. EXAM: CT GUIDED CATHETER DRAINAGE OF COLONIC DIVERTICULAR ABSCESS ANESTHESIA/SEDATION: 2.0 mg IV Versed 100 mcg IV Fentanyl Total Moderate Sedation Time:  20 minutes The patient's level of consciousness and physiologic status were continuously monitored  during the procedure by Radiology nursing. PROCEDURE: The procedure, risks, benefits, and alternatives were explained to the patient. Questions regarding the procedure were encouraged and answered. The patient understands and consents to the procedure. A time out was performed prior to initiating the procedure. CT was performed through the pelvis in a supine position. The right lower abdominal wall was prepped with chlorhexidine in a sterile fashion, and a sterile drape was applied covering the operative field. A sterile gown and sterile gloves were used for the procedure. Local anesthesia was provided with 1% Lidocaine. Under CT guidance, an 18 gauge trocar needle was advanced to the level of a diverticular abscess in the central pelvis from a right-sided approach. After confirming needle tip position and with return of fluid, a guidewire was advanced and the needle removed. The percutaneous tract was dilated and a 10 French percutaneous drainage catheter advanced. Drain position was confirmed by CT. A sample of fluid was sent for culture analysis. The drain was attached to a suction bulb and secured at the skin with a Prolene retention suture and adhesive StatLock device. COMPLICATIONS: None FINDINGS: After needle access of the deep diverticular abscess, there was return of purulent and foul-smelling fluid. After drain placement, there was rapid return of purulent fluid. A sample was sent for culture analysis. IMPRESSION: CT-guided percutaneous catheter drainage of pelvic colonic diverticular abscess. A 10 French percutaneous drain was placed within the abscess and attached to  suction bulb drainage. A sample of purulent fluid was sent for culture analysis. Electronically Signed   By: Irish Lack M.D.   On: 01/29/2021 13:13        Scheduled Meds: . carbidopa-levodopa  1 tablet Oral QID  . DULoxetine  60 mg Oral Daily  . enoxaparin (LOVENOX) injection  0.5 mg/kg Subcutaneous Q24H  . insulin aspart  0-5 Units Subcutaneous QHS  . insulin aspart  0-9 Units Subcutaneous TID WC  . levothyroxine  50 mcg Oral Q0600  . mirtazapine  7.5 mg Oral QHS  . pregabalin  25 mg Oral Daily  . sodium chloride flush  3 mL Intravenous Q12H   Continuous Infusions: . lactated ringers     And  . lactated ringers    . lactated ringers 100 mL/hr at 01/29/21 1725  . piperacillin-tazobactam (ZOSYN)  IV 3.375 g (01/30/21 1343)     LOS: 2 days    Time spent: 35 minutes    Leomia Blake A Quantia Grullon, MD [facility]   If 7PM-7AM, please contact night-coverage [email or web address]  01/30/2021, 3:52 PM

## 2021-01-30 NOTE — Progress Notes (Signed)
Patient ID: Cynthia Jones, female   DOB: Jul 01, 1941, 80 y.o.   MRN: [id number]     SURGICAL PROGRESS NOTE   Hospital Day(s): 2.   Post op day(s):  [name]   Interval History: Patient seen and examined, no acute events or new complaints overnight. Patient reports feeling well.  She reports that the pain on the lower abdomen has been slowly improving.  No pain radiation.  Alleviating factor was drainage of the abscess.  There has been no aggravating factors.  Vital signs in last 24 hours: [min-max] current  Temp:  [98 F (36.7 C)-98.9 F (37.2 C)] 98 F (36.7 C) (06/02 0753) Pulse Rate:  [82-103] 82 (06/02 0753) Resp:  [16-20] 18 (06/02 0753) BP: (114-160)/(51-66) 160/59 (06/02 0753) SpO2:  [93 %-99 %] 96 % (06/02 0753)     Height: 5' (152.4 cm) Weight: 105.1 kg BMI (Calculated): 45.25   Physical Exam:  Constitutional: alert, cooperative and no distress  Respiratory: breathing non-labored at rest  Cardiovascular: regular rate and sinus rhythm  Gastrointestinal: soft, non-tender, and non-distended  Labs:  CBC Latest Ref Rng & Units 01/30/2021 01/29/2021 01/28/2021  WBC 4.0 - 10.5 K/uL 21.0(H) 27.0(H) 26.5(H)  Hemoglobin 12.0 - 15.0 g/dL 11.7(L) 11.6(L) 12.1  Hematocrit 36.0 - 46.0 % 34.2(L) 34.1(L) 35.0(L)  Platelets 150 - 400 K/uL 284 235 249   CMP Latest Ref Rng & Units 01/30/2021 01/29/2021 01/28/2021  Glucose 70 - 99 mg/dL 761(Y) 073(X) 106(Y)  BUN 8 - 23 mg/dL 6(L) 7(L) 9  Creatinine 0.44 - 1.00 mg/dL 6.94 8.54 6.27  Sodium 135 - 145 mmol/L 135 132(L) 134(L)  Potassium 3.5 - 5.1 mmol/L 3.5 3.7 3.0(L)  Chloride 98 - 111 mmol/L 103 103 101  CO2 22 - 32 mmol/L 23 22 21(L)  Calcium 8.9 - 10.3 mg/dL 8.1(L) 8.2(L) 8.2(L)  Total Protein 6.5 - 8.1 g/dL - - 6.5  Total Bilirubin 0.3 - 1.2 mg/dL - - 1.1  Alkaline Phos 38 - 126 U/L - - 38  AST 15 - 41 U/L - - 17  ALT 0 - 44 U/L - - 10    Imaging studies: No new pertinent imaging studies   Assessment/Plan:  80 y.o.femalewith completed  diverticulitis with abscess s/p CT-guided percutaneous drainage, complicated by pertinent comorbidities includingParkinson disease, type 2 diabetes mellitus, hypertension, hypothyroidism.  Patient doing well.  The patient without fever or tachycardia.  Abdominal pain improving.  There was some improvement of the white blood cell count as well.  I advance her diet to full liquids.  We will continue to follow white blood cell count trend and assessment for diet toleration.  Continue with IV antibiotic therapy.  We will continue to follow.  Encouraged the patient to ambulate.  Gae Gallop, MD

## 2021-01-31 LAB — CBC
HCT: 33.5 % — ABNORMAL LOW (ref 36.0–46.0)
Hemoglobin: 11.2 g/dL — ABNORMAL LOW (ref 12.0–15.0)
MCH: 31.3 pg (ref 26.0–34.0)
MCHC: 33.4 g/dL (ref 30.0–36.0)
MCV: 93.6 fL (ref 80.0–100.0)
Platelets: 274 10*3/uL (ref 150–400)
RBC: 3.58 MIL/uL — ABNORMAL LOW (ref 3.87–5.11)
RDW: 14.5 % (ref 11.5–15.5)
WBC: 12.1 10*3/uL — ABNORMAL HIGH (ref 4.0–10.5)
nRBC: 0 % (ref 0.0–0.2)

## 2021-01-31 LAB — GLUCOSE, CAPILLARY
Glucose-Capillary: 107 mg/dL — ABNORMAL HIGH (ref 70–99)
Glucose-Capillary: 157 mg/dL — ABNORMAL HIGH (ref 70–99)
Glucose-Capillary: 205 mg/dL — ABNORMAL HIGH (ref 70–99)
Glucose-Capillary: 175 mg/dL — ABNORMAL HIGH (ref 70–99)

## 2021-01-31 LAB — BASIC METABOLIC PANEL
Anion gap: 6 (ref 5–15)
BUN: 7 mg/dL — ABNORMAL LOW (ref 8–23)
CO2: 25 mmol/L (ref 22–32)
Calcium: 8.1 mg/dL — ABNORMAL LOW (ref 8.9–10.3)
Chloride: 106 mmol/L (ref 98–111)
Creatinine, Ser: 0.64 mg/dL (ref 0.44–1.00)
GFR, Estimated: >60 mL/min (ref >60)
Glucose, Bld: 107 mg/dL — ABNORMAL HIGH (ref 70–99)
Potassium: 3.6 mmol/L (ref 3.5–5.1)
Sodium: 137 mmol/L (ref 135–145)

## 2021-01-31 MED ORDER — FUROSEMIDE 10 MG/ML IJ SOLN
20.0000 mg | Freq: Once | INTRAMUSCULAR | Status: AC
  Administered 2021-01-31: 20 mg via INTRAVENOUS
  Filled 2021-01-31: qty 2

## 2021-01-31 MED ORDER — GUAIFENESIN ER 600 MG PO TB12
600.0000 mg | ORAL_TABLET | Freq: Two times a day (BID) | ORAL | Status: DC
  Administered 2021-01-31 – 2021-02-02 (×4): 600 mg via ORAL
  Filled 2021-01-31 (×4): qty 1

## 2021-01-31 MED ORDER — ALBUTEROL SULFATE (2.5 MG/3ML) 0.083% IN NEBU
2.5000 mg | INHALATION_SOLUTION | Freq: Four times a day (QID) | RESPIRATORY_TRACT | Status: DC
  Administered 2021-01-31 – 2021-02-02 (×7): 2.5 mg via RESPIRATORY_TRACT
  Filled 2021-01-31 (×7): qty 3

## 2021-01-31 MED ORDER — POTASSIUM CHLORIDE CRYS ER 20 MEQ PO TBCR
40.0000 meq | EXTENDED_RELEASE_TABLET | Freq: Once | ORAL | Status: AC
  Administered 2021-01-31: 40 meq via ORAL
  Filled 2021-01-31: qty 2

## 2021-01-31 NOTE — Progress Notes (Signed)
PROGRESS NOTE    Cynthia Jones  ONG:[id number] DOB: 22-Dec-1940 DOA: 01/27/2021 PCP: Loura Pardon, MD   Brief Narrative: 29 old with past medical history significant for Parkinson's disease, diabetes type 2, hypertension, hypothyroidism who presented to the ED for evaluation of weakness, dysuria and fever.  Patient was noted to have a temperature of 103.  She was hospitalized and started on IV antibiotics.  CT abdomen and pelvis: Raises concern for diverticulitis with abscess.  General surgery and IR consulted.  Patient underwent CT-guided drainage of diverticular abscess 10 Fr drain in placed.    Assessment & Plan:   Principal Problem:   Sepsis (HCC) Active Problems:   Hypertension associated with diabetes (HCC)   Diabetes mellitus (HCC)   Osteoarthritis   Neuropathy due to secondary diabetes (HCC)   Hypothyroid   Parkinson disease (HCC)   Hyperlipidemia associated with type 2 diabetes mellitus (HCC)   1-Sepsis secondary to acute diverticulitis with abscess: Patient presented with abdominal pain, CT abdomen and pelvis raises concern for diverticulitis with abscess. Surgery and IR consulted. Continue with IV Zosyn.  Underwent CT-guided drainage of diverticular abscess with a 10 Fr drain 6/01 Culture growing E coli, and enterococcus. Plan to discharge on Augmentin and Bactrim. Discussed with surgery plan to complete 14 days antibiotics.  Requiring opioid for pain.  WBC down to 12.   2-Hyponatremia, hypokalemia and hypomagnesemia: Replete Resolved. Monitor.   Diabetes type 2: Continue to hold metformin, continue with sliding scale insulin.  Hypertension: Coreg and losartan held due to initially low blood pressure. Monitor blood pressure  Generalized weakness: PT OT evaluation. Improve, plan to discharge home.   Hypothyroidism: Continue with Synthroid.  History of Parkinson disease: Continue with Sinemet.  Severe obesity: Needs  lifestyle modification  BL  Wheezing;  IV lasix.  Nebulizer treatments.    Estimated body mass index is 45.25 kg/m as calculated from the following:   Height as of this encounter: 5' (1.524 m).   Weight as of this encounter: 105.1 kg.   DVT prophylaxis: Lovenox Code Status: DNR Family Communication: care discussed with granddaughter 6/01 6/03 over phone Disposition Plan:  Status is: Inpatient  Remains inpatient appropriate because:IV treatments appropriate due to intensity of illness or inability to take PO   Dispo:  Patient From: Home  Planned Disposition: Skilled Nursing Facility  Medically stable for discharge: No          Consultants:   Surgery  IR  Procedures:   CT guide drain place for abscess  Antimicrobials:  On IV Zosyn  Subjective: She denies worsening abdominal pain. She denies dyspnea.   Objective: Vitals:   01/30/21 1545 01/30/21 2005 01/31/21 0423 01/31/21 0807  BP: (!) 117/49 (!) 139/52 133/61 (!) 143/65  Pulse: 86 92 78 76  Resp: Temp: 98.3 F (36.8 C) 98.3 F (36.8 C) 97.7 F (36.5 C) 97.6 F (36.4 C)  TempSrc:  Oral Oral Oral  SpO2: 96% 100% 94% 97%  Weight:      Height:        Intake/Output Summary (Last 24 hours) at 01/31/2021 1016 Last data filed at 01/31/2021 0810 Gross per 24 hour  Intake 105 ml  Output 1930 ml  Net -1825 ml   Filed Weights   01/27/21 2145 01/28/21 0214  Weight: 104 kg 105.1 kg    Examination:  General exam: NAD Respiratory system: BL wheezing.  Cardiovascular system: S 1, S 2 RRR Gastrointestinal system: BS present, soft, nt Central  nervous system: Alert Extremities: Symmetric power   Data Reviewed: I have personally reviewed following labs and imaging studies  CBC: Recent Labs  Lab 01/27/21 2148 01/28/21 0431 01/29/21 0439 01/30/21 0409 01/31/21 0419  WBC 21.2* 26.5* 27.0* 21.0* 12.1*  NEUTROABS 17.6*  --   --   --   --   HGB 13.4 12.1 11.6* 11.7* 11.2*  HCT 38.7 35.0* 34.1* 34.2* 33.5*  MCV  90.4 90.2 90.9 91.2 93.6  PLT 275 249 235 284 274   Basic Metabolic Panel: Recent Labs  Lab 01/27/21 2148 01/28/21 0431 01/29/21 0439 01/30/21 0409 01/31/21 0419  NA 133* 134* 132* 135 137  K 3.4* 3.0* 3.7 3.5 3.6  CL 99 101 103 103 106  CO2 20* 21* 22 23 25   GLUCOSE 164* 135* 116* 113* 107*  BUN 9 9 7* 6* 7*  CREATININE 0.76 0.80 0.74 0.63 0.64  CALCIUM 8.7* 8.2* 8.2* 8.1* 8.1*  MG  --  1.5* 2.3 2.1  --    GFR: Estimated Creatinine Clearance: 63.4 mL/min (by C-G formula based on SCr of 0.64 mg/dL). Liver Function Tests: Recent Labs  Lab 01/27/21 2148 01/28/21 0431  AST 20 17  ALT 6 10  ALKPHOS 42 38  BILITOT 1.0 1.1  PROT 7.6 6.5  ALBUMIN 3.4* 3.0*   No results for input(s): LIPASE, AMYLASE in the last 168 hours. No results for input(s): AMMONIA in the last 168 hours. Coagulation Profile: Recent Labs  Lab 01/27/21 2148  INR 1.1   Cardiac Enzymes: No results for input(s): CKTOTAL, CKMB, CKMBINDEX, TROPONINI in the last 168 hours. BNP (last 3 results) No results for input(s): PROBNP in the last 8760 hours. HbA1C: No results for input(s): HGBA1C in the last 72 hours. CBG: Recent Labs  Lab 01/30/21 0753 01/30/21 1146 01/30/21 1646 01/30/21 2101 01/31/21 0806  GLUCAP 105* 189* 158* 155* 107*   Lipid Profile: No results for input(s): CHOL, HDL, LDLCALC, TRIG, CHOLHDL, LDLDIRECT in the last 72 hours. Thyroid Function Tests: No results for input(s): TSH, T4TOTAL, FREET4, T3FREE, THYROIDAB in the last 72 hours. Anemia Panel: No results for input(s): VITAMINB12, FOLATE, FERRITIN, TIBC, IRON, RETICCTPCT in the last 72 hours. Sepsis Labs: Recent Labs  Lab 01/27/21 2148 01/27/21 2208 01/28/21 0431  PROCALCITON  --   --  0.22  LATICACIDVEN 1.3 1.3  --     Recent Results (from the past 240 hour(s))  Blood Culture (routine x 2)     Status: None (Preliminary result)   Collection Time: 01/27/21  9:48 PM   Specimen: BLOOD  Result Value Ref Range Status    Specimen Description BLOOD RIGHT [facility]  Final   Special Requests   Final    BOTTLES DRAWN AEROBIC AND ANAEROBIC Blood Culture results may not be optimal due to an excessive volume of blood received in culture bottles   Culture   Final    NO GROWTH 4 DAYS Performed at [facility], [address], [place], [place] [address]    Report Status PENDING  Incomplete  Blood Culture (routine x 2)     Status: Abnormal   Collection Time: 01/27/21 10:33 PM   Specimen: BLOOD  Result Value Ref Range Status   Specimen Description   Final    BLOOD LEFT HAND Performed at [facility], [address] [place], [place] [address]    Special Requests   Final    BOTTLES DRAWN AEROBIC AND ANAEROBIC Blood Culture results may not be optimal due to  an inadequate volume of blood received in culture bottles Performed at [facility], [address] [place], [place] [address]    Culture  Setup Time   Final    GRAM POSITIVE COCCI ANAEROBIC BOTTLE ONLY CRITICAL RESULT CALLED TO, READ BACK BY AND VERIFIED WITH: WALLY RANAZIRI 01/28/21 1736 KLW    Culture (A)  Final    STAPHYLOCOCCUS EPIDERMIDIS THE SIGNIFICANCE OF ISOLATING THIS ORGANISM FROM A SINGLE SET OF BLOOD CULTURES WHEN MULTIPLE SETS ARE DRAWN IS UNCERTAIN. PLEASE NOTIFY THE MICROBIOLOGY DEPARTMENT WITHIN ONE WEEK IF SPECIATION AND SENSITIVITIES ARE REQUIRED. Performed at [facility], [address], [place], [place] [address]    Report Status 01/30/2021 FINAL  Final  Blood Culture ID Panel (Reflexed)     Status: Abnormal   Collection Time: 01/27/21 10:33 PM  Result Value Ref Range Status   Enterococcus faecalis NOT DETECTED NOT DETECTED Final   Enterococcus Faecium NOT DETECTED NOT DETECTED Final   Listeria monocytogenes NOT DETECTED NOT DETECTED Final   Staphylococcus species DETECTED (A) NOT DETECTED Final    Comment: CRITICAL RESULT CALLED TO, READ BACK BY AND VERIFIED WITH: WALLY RANAZIRI 01/28/21 1736 KLW     Staphylococcus aureus (BCID) NOT DETECTED NOT DETECTED Final   Staphylococcus epidermidis DETECTED (A) NOT DETECTED Final    Comment: CRITICAL RESULT CALLED TO, READ BACK BY AND VERIFIED WITH: WALLY RANAZIRI 01/28/21 1736 KLW    Staphylococcus lugdunensis NOT DETECTED NOT DETECTED Final   Streptococcus species NOT DETECTED NOT DETECTED Final   Streptococcus agalactiae NOT DETECTED NOT DETECTED Final   Streptococcus pneumoniae NOT DETECTED NOT DETECTED Final   Streptococcus pyogenes NOT DETECTED NOT DETECTED Final   A.calcoaceticus-baumannii NOT DETECTED NOT DETECTED Final   Bacteroides fragilis NOT DETECTED NOT DETECTED Final   Enterobacterales NOT DETECTED NOT DETECTED Final   Enterobacter cloacae complex NOT DETECTED NOT DETECTED Final   Escherichia coli NOT DETECTED NOT DETECTED Final   Klebsiella aerogenes NOT DETECTED NOT DETECTED Final   Klebsiella oxytoca NOT DETECTED NOT DETECTED Final   Klebsiella pneumoniae NOT DETECTED NOT DETECTED Final   Proteus species NOT DETECTED NOT DETECTED Final   Salmonella species NOT DETECTED NOT DETECTED Final   Serratia marcescens NOT DETECTED NOT DETECTED Final   Haemophilus influenzae NOT DETECTED NOT DETECTED Final   Neisseria meningitidis NOT DETECTED NOT DETECTED Final   Pseudomonas aeruginosa NOT DETECTED NOT DETECTED Final   Stenotrophomonas maltophilia NOT DETECTED NOT DETECTED Final   Candida albicans NOT DETECTED NOT DETECTED Final   Candida auris NOT DETECTED NOT DETECTED Final   Candida glabrata NOT DETECTED NOT DETECTED Final   Candida krusei NOT DETECTED NOT DETECTED Final   Candida parapsilosis NOT DETECTED NOT DETECTED Final   Candida tropicalis NOT DETECTED NOT DETECTED Final   Cryptococcus neoformans/gattii NOT DETECTED NOT DETECTED Final   Methicillin resistance mecA/C NOT DETECTED NOT DETECTED Final    Comment: Performed at [facility], [address] [place], [place] [address]  Resp Panel by RT-PCR (Flu A&B,  Covid) Nasopharyngeal Swab     Status: None   Collection Time: 01/27/21 10:34 PM   Specimen: Nasopharyngeal Swab; Nasopharyngeal(NP) swabs in vial transport medium  Result Value Ref Range Status   SARS Coronavirus 2 by RT PCR NEGATIVE NEGATIVE Final    Comment: (NOTE) SARS-CoV-2 target nucleic acids are NOT DETECTED.  The SARS-CoV-2 RNA is generally detectable in upper respiratory specimens during the acute phase of infection. The lowest concentration of SARS-CoV-2 viral copies  this assay can detect is 138 copies/mL. A negative result does not preclude SARS-Cov-2 infection and should not be used as the sole basis for treatment or other patient management decisions. A negative result may occur with  improper specimen collection/handling, submission of specimen other than nasopharyngeal swab, presence of viral mutation(s) within the areas targeted by this assay, and inadequate number of viral copies(<138 copies/mL). A negative result must be combined with clinical observations, patient history, and epidemiological information. The expected result is Negative.  Fact Sheet for Patients:  BloggerCourse.com  Fact Sheet for Healthcare Providers:  SeriousBroker.it  This test is no t yet approved or cleared by the [place] FDA and  has been authorized for detection and/or diagnosis of SARS-CoV-2 by FDA under an Emergency Use Authorization (EUA). This EUA will remain  in effect (meaning this test can be used) for the duration of the COVID-19 declaration under Section 564(b)(1) of the Act, 21 U.S.C.section 360bbb-3(b)(1), unless the authorization is terminated  or revoked sooner.       Influenza A by PCR NEGATIVE NEGATIVE Final   Influenza B by PCR NEGATIVE NEGATIVE Final    Comment: (NOTE) The Xpert Xpress SARS-CoV-2/FLU/RSV plus assay is intended as an aid in the diagnosis of influenza from Nasopharyngeal swab specimens and should  not be used as a sole basis for treatment. Nasal washings and aspirates are unacceptable for Xpert Xpress SARS-CoV-2/FLU/RSV testing.  Fact Sheet for Patients: BloggerCourse.com  Fact Sheet for Healthcare Providers: SeriousBroker.it  This test is not yet approved or cleared by the [place] FDA and has been authorized for detection and/or diagnosis of SARS-CoV-2 by FDA under an Emergency Use Authorization (EUA). This EUA will remain in effect (meaning this test can be used) for the duration of the COVID-19 declaration under Section 564(b)(1) of the Act, 21 U.S.C. section 360bbb-3(b)(1), unless the authorization is terminated or revoked.  Performed at [facility], [address], [place], [place] [address]   Urine culture     Status: None   Collection Time: 01/28/21  6:01 AM   Specimen: Urine, Random  Result Value Ref Range Status   Specimen Description   Final    URINE, RANDOM Performed at [facility], [address] [place], [place] [address]    Special Requests   Final    NONE Performed at [facility], [address] [place], [place] [address]    Culture   Final    NO GROWTH Performed at [facility], [address], [place], [place] [address]    Report Status 01/29/2021 FINAL  Final  Aerobic/Anaerobic Culture (surgical/deep wound)     Status: None (Preliminary result)   Collection Time: 01/29/21 12:42 PM   Specimen: Abscess  Result Value Ref Range Status   Specimen Description   Final    ABSCESS Performed at [facility], [address], [place], [place] [address]    Special Requests   Final    Normal Performed at [facility], [address] [place], [place] [address]    Gram Stain   Final    ABUNDANT WBC PRESENT, PREDOMINANTLY PMN ABUNDANT GRAM NEGATIVE RODS ABUNDANT GRAM POSITIVE COCCI IN PAIRS IN CHAINS ABUNDANT GRAM POSITIVE RODS Performed  at [facility], [address] [place], [place] [address]    Culture   Final    ABUNDANT ESCHERICHIA COLI ABUNDANT ENTEROCOCCUS AVIUM    Report Status PENDING  Incomplete   Organism ID, Bacteria ESCHERICHIA  COLI  Final   Organism ID, Bacteria ENTEROCOCCUS AVIUM  Final      Susceptibility   Enterococcus avium - MIC*    AMPICILLIN <=2 SENSITIVE Sensitive     VANCOMYCIN <=0.5 SENSITIVE Sensitive     GENTAMICIN SYNERGY SENSITIVE Sensitive     * ABUNDANT ENTEROCOCCUS AVIUM   Escherichia coli - MIC*    AMPICILLIN >=32 RESISTANT Resistant     CEFAZOLIN 8 SENSITIVE Sensitive     CEFEPIME <=0.12 SENSITIVE Sensitive     CEFTAZIDIME <=1 SENSITIVE Sensitive     CEFTRIAXONE <=0.25 SENSITIVE Sensitive     CIPROFLOXACIN <=0.25 SENSITIVE Sensitive     GENTAMICIN <=1 SENSITIVE Sensitive     IMIPENEM <=0.25 SENSITIVE Sensitive     TRIMETH/SULFA <=20 SENSITIVE Sensitive     AMPICILLIN/SULBACTAM >=32 RESISTANT Resistant     PIP/TAZO <=4 SENSITIVE Sensitive     * ABUNDANT ESCHERICHIA COLI         Radiology Studies: CT IMAGE GUIDED DRAINAGE BY PERCUTANEOUS CATHETER  Result Date: 01/29/2021 CLINICAL DATA:  Colonic diverticulitis with abscess formation in the central pelvis posterior and inferior to the sigmoid colon. EXAM: CT GUIDED CATHETER DRAINAGE OF COLONIC DIVERTICULAR ABSCESS ANESTHESIA/SEDATION: 2.0 mg IV Versed 100 mcg IV Fentanyl Total Moderate Sedation Time:  20 minutes The patient's level of consciousness and physiologic status were continuously monitored during the procedure by Radiology nursing. PROCEDURE: The procedure, risks, benefits, and alternatives were explained to the patient. Questions regarding the procedure were encouraged and answered. The patient understands and consents to the procedure. A time out was performed prior to initiating the procedure. CT was performed through the pelvis in a supine position. The right lower abdominal wall was prepped with chlorhexidine in a  sterile fashion, and a sterile drape was applied covering the operative field. A sterile gown and sterile gloves were used for the procedure. Local anesthesia was provided with 1% Lidocaine. Under CT guidance, an 18 gauge trocar needle was advanced to the level of a diverticular abscess in the central pelvis from a right-sided approach. After confirming needle tip position and with return of fluid, a guidewire was advanced and the needle removed. The percutaneous tract was dilated and a 10 French percutaneous drainage catheter advanced. Drain position was confirmed by CT. A sample of fluid was sent for culture analysis. The drain was attached to a suction bulb and secured at the skin with a Prolene retention suture and adhesive StatLock device. COMPLICATIONS: None FINDINGS: After needle access of the deep diverticular abscess, there was return of purulent and foul-smelling fluid. After drain placement, there was rapid return of purulent fluid. A sample was sent for culture analysis. IMPRESSION: CT-guided percutaneous catheter drainage of pelvic colonic diverticular abscess. A 10 French percutaneous drain was placed within the abscess and attached to suction bulb drainage. A sample of purulent fluid was sent for culture analysis. Electronically Signed   By: Irish Lack M.D.   On: 01/29/2021 13:13        Scheduled Meds: . albuterol  2.5 mg Nebulization Q6H  . carbidopa-levodopa  1 tablet Oral QID  . DULoxetine  60 mg Oral Daily  . enoxaparin (LOVENOX) injection  0.5 mg/kg Subcutaneous Q24H  . furosemide  20 mg Intravenous Once  . insulin aspart  0-5 Units Subcutaneous QHS  . insulin aspart  0-9 Units Subcutaneous TID WC  . levothyroxine  50 mcg Oral Q0600  . mirtazapine  7.5 mg Oral QHS  . potassium chloride  40 mEq Oral  Once  . pregabalin  25 mg Oral Daily  . sodium chloride flush  3 mL Intravenous Q12H  . sodium chloride flush  5 mL Intracatheter Q8H   Continuous Infusions: .  piperacillin-tazobactam (ZOSYN)  IV 3.375 g (01/31/21 0545)     LOS: 3 days    Time spent: 35 minutes    Arnecia Ector A Melika Reder, MD [facility]   If 7PM-7AM, please contact night-coverage [email or web address]  01/31/2021, 10:16 AM

## 2021-01-31 NOTE — Plan of Care (Signed)
No prns given this shift. 20 cc of purulent cloudy drainage from JP drain to RLQ. Dressing changed not drainage noted on dressing. Drain flushed with 5 ml of Normal saline.  Problem: Education: Goal: Knowledge of General Education information will improve Description: Including pain rating scale, medication(s)/side effects and non-pharmacologic comfort measures Outcome: Progressing   Problem: Health Behavior/Discharge Planning: Goal: Ability to manage health-related needs will improve Outcome: Progressing   Problem: Clinical Measurements: Goal: Ability to maintain clinical measurements within normal limits will improve Outcome: Progressing Goal: Will remain free from infection Outcome: Progressing Goal: Diagnostic test results will improve Outcome: Progressing Goal: Respiratory complications will improve Outcome: Progressing Goal: Cardiovascular complication will be avoided Outcome: Progressing   Problem: Activity: Goal: Risk for activity intolerance will decrease Outcome: Progressing   Problem: Nutrition: Goal: Adequate nutrition will be maintained Outcome: Progressing   Problem: Coping: Goal: Level of anxiety will decrease Outcome: Progressing   Problem: Elimination: Goal: Will not experience complications related to bowel motility Outcome: Progressing Goal: Will not experience complications related to urinary retention Outcome: Progressing   Problem: Pain Managment: Goal: General experience of comfort will improve Outcome: Progressing   Problem: Safety: Goal: Ability to remain free from injury will improve Outcome: Progressing   Problem: Skin Integrity: Goal: Risk for impaired skin integrity will decrease Outcome: Progressing   Problem: Fluid Volume: Goal: Hemodynamic stability will improve Outcome: Progressing   Problem: Clinical Measurements: Goal: Diagnostic test results will improve Outcome: Progressing Goal: Signs and symptoms of infection will  decrease Outcome: Progressing   Problem: Respiratory: Goal: Ability to maintain adequate ventilation will improve Outcome: Progressing

## 2021-01-31 NOTE — Progress Notes (Signed)
Occupational Therapy Treatment Patient Details Name: Cynthia Jones MRN: [id number] DOB: Apr 02, 1941 Today's Date: 01/31/2021    History of present illness Patient is a 80 y.o. female with medical history significant for Parkinson's disease, T2DM, HTN, hypothyroidism who presents to the ED for evaluation of weakness, dysuria, fever. Patient being evaluated for sepsis. Patient with recent fall from chair at home with head CT showing no evidence for acute intracranial abnormality.   OT comments  Pt seen for OT tx this date to f/u re: safety with ADLs/ADL mobility. Pt reports feeling better this date and only minimal vague soreness from procedure/drain. Pt agreeable to getting OOB to chair with OT educating re: benefits of activity versus risks of extended time spent in bed. Pt requires MIN A/CGA for sup to sit and demos good control, required extended time. Pt then requires CGA/SUPV for STS and SPS from EOB to recliner. While in recliner, OT provides SETUP and pt able to perform morning g/h task routine including oral care. Pt left with all needs met and in reach. RN notified of session contents. Pt d/c recommendation updated to [facility] to reflect progress.   Follow Up Recommendations  Home health OT    Equipment Recommendations  3 in 1 bedside commode;Tub/shower seat;Other (comment) (2ww)    Recommendations for Other Services      Precautions / Restrictions Precautions Precautions: Fall Restrictions Weight Bearing Restrictions: No       Mobility Bed Mobility Overal bed mobility: Needs Assistance Bed Mobility: Supine to Sit     Supine to sit: Min guard;Min assist;HOB elevated     General bed mobility comments: increased time    Transfers Overall transfer level: Needs assistance Equipment used: Rolling walker (2 wheeled) Transfers: Sit to/from [facility] Sit to Stand: Min guard;Supervision Stand pivot transfers: Min guard       General transfer comment:  increased time, cues for hand placement. Pt demos good control    Balance Overall balance assessment: History of Falls;Needs assistance Sitting-balance support: Feet unsupported;Bilateral upper extremity supported Sitting balance-Leahy Scale: Good   Postural control: Posterior lean Standing balance support: Bilateral upper extremity supported;During functional activity Standing balance-Leahy Scale: Fair Standing balance comment: UE support required to sustain static standing, no LOB. Pt with good control.                           ADL either performed or assessed with clinical judgement   ADL Overall ADL's : Needs assistance/impaired     Grooming: Wash/dry hands;Wash/dry face;Oral care;Set up;Sitting                                       Vision Baseline Vision/History: Wears glasses Wears Glasses: At all times Patient Visual Report: No change from baseline     Perception     Praxis      Cognition Arousal/Alertness: Awake/alert Behavior During Therapy: WFL for tasks assessed/performed Overall Cognitive Status: Within Functional Limits for tasks assessed                                 General Comments: Pt is A and O x 4. Very pleasant and motivated        Exercises Other Exercises Other Exercises: OT educates pt re: benefits of OOB activity. Pt agreeable to getting OOB  to chair. requires CGA/SUPV as well as MIN cues for use of RW to STS and SPS from EOB to chair. Requires increased time.   Shoulder Instructions       General Comments      Pertinent Vitals/ Pain       Pain Assessment: 0-10 Pain Score: 4  Faces Pain Scale: Hurts a little bit Pain Location: L knee Pain Descriptors / Indicators: Discomfort Pain Intervention(s): Limited activity within patient's tolerance;Monitored during session;Premedicated before session;Repositioned  Home Living                                          Prior  Functioning/Environment              Frequency  Min 1X/week        Progress Toward Goals  OT Goals(current goals can now be found in the care plan section)  Progress towards OT goals: Progressing toward goals  Acute Rehab OT Goals Patient Stated Goal: to go home (Simultaneous filing. User may not have seen previous data.) OT Goal Formulation: With patient Time For Goal Achievement: 02/12/21 Potential to Achieve Goals: [place] Discharge plan needs to be updated    Co-evaluation                 AM-PAC OT "6 Clicks" Daily Activity     Outcome Measure   Help from another person eating meals?: A Little Help from another person taking care of personal grooming?: A Little Help from another person toileting, which includes using toliet, bedpan, or urinal?: A Lot Help from another person bathing (including washing, rinsing, drying)?: A Lot Help from another person to put on and taking off regular upper body clothing?: A Little Help from another person to put on and taking off regular lower body clothing?: A Lot 6 Click Score: 15    End of Session Equipment Utilized During Treatment: Gait belt;Rolling walker  OT Visit Diagnosis: Unsteadiness on feet (R26.81);Muscle weakness (generalized) (M62.81)   Activity Tolerance Patient tolerated treatment well   Patient Left in bed;with bed alarm set;with call bell/phone within reach   Nurse Communication Mobility status        Time: 3335-4562 OT Time Calculation (min): 23 min  Charges: OT General Charges $OT Visit: 1 Visit OT Treatments $Self Care/Home Management : 8-22 mins $Therapeutic Activity: 8-22 mins  Gerrianne Scale, [place], OTR/L ascom [phone] 01/31/21, 2:37 PM

## 2021-01-31 NOTE — Progress Notes (Signed)
Patient ID: Cynthia Jones, female   DOB: November 08, 1940, 80 y.o.   MRN: [id number]     SURGICAL PROGRESS NOTE   Hospital Day(s): 3.   Post op day(s):  [name]   Interval History: Patient seen and examined, no acute events or new complaints overnight. Patient reports feeling well.  She denies abdominal pain.  She reports he tolerated a full liquids.  No abdominal pain after eating.  Patient continue passing gas.  Denies any fever or chills.  Vital signs in last 24 hours: [min-max] current  Temp:  [97.7 F (36.5 C)-98.3 F (36.8 C)] 97.7 F (36.5 C) (06/03 0423) Pulse Rate:  [78-92] 78 (06/03 0423) Resp:  [16-18] 17 (06/03 0423) BP: (117-154)/(49-63) 133/61 (06/03 0423) SpO2:  [94 %-100 %] 94 % (06/03 0423)     Height: 5' (152.4 cm) Weight: 105.1 kg BMI (Calculated): 45.25   Physical Exam:  Constitutional: alert, cooperative and no distress  Respiratory: breathing non-labored at rest  Cardiovascular: regular rate and sinus rhythm  Gastrointestinal: soft, non-tender, and non-distended.  Drain in place with seropurulent output.  Labs:  CBC Latest Ref Rng & Units 01/31/2021 01/30/2021 01/29/2021  WBC 4.0 - 10.5 K/uL 12.1(H) 21.0(H) 27.0(H)  Hemoglobin 12.0 - 15.0 g/dL 11.2(L) 11.7(L) 11.6(L)  Hematocrit 36.0 - 46.0 % 33.5(L) 34.2(L) 34.1(L)  Platelets 150 - 400 K/uL 274 284 235   CMP Latest Ref Rng & Units 01/31/2021 01/30/2021 01/29/2021  Glucose 70 - 99 mg/dL 277(O) 242(P) 536(R)  BUN 8 - 23 mg/dL 7(L) 6(L) 7(L)  Creatinine 0.44 - 1.00 mg/dL 4.43 1.54 0.08  Sodium 135 - 145 mmol/L 137 135 132(L)  Potassium 3.5 - 5.1 mmol/L 3.6 3.5 3.7  Chloride 98 - 111 mmol/L 106 103 103  CO2 22 - 32 mmol/L 25 23 22   Calcium 8.9 - 10.3 mg/dL 8.1(L) 8.1(L) 8.2(L)  Total Protein 6.5 - 8.1 g/dL - - -  Total Bilirubin 0.3 - 1.2 mg/dL - - -  Alkaline Phos 38 - 126 U/L - - -  AST 15 - 41 U/L - - -  ALT 0 - 44 U/L - - -    Imaging studies: No new pertinent imaging studies   Assessment/Plan:  80  y.o.femalewith completed diverticulitis with abscesss/p CT-guided percutaneous drainage, complicated by pertinent comorbidities includingParkinson disease, type 2 diabetes mellitus, hypertension, hypothyroidism.  Patient continue responding adequately to IV antibiotic therapy and percutaneous drainage.  Today white blood cell count decreased to 12,000 from 21,000.  There has been no fever.  Abdominal exam benign.  From the surgical standpoint patient can be discharged with oral antibiotic therapy if she tolerates soft diet.  She should continue soft diet for the next few weeks.  If discharge I will follow as outpatient in 1 week for management of the drain.  I will coordinate follow-up imaging as outpatient for assessment of drain removal.  No surgical intervention needed during this admission.  72, MD

## 2021-01-31 NOTE — TOC Progression Note (Signed)
Transition of Care [facility]) - Progression Note    Patient Details  Name: REAGAN BEHLKE MRN: [id number] Date of Birth: 1941/06/23  [facility] CM/SW Contact  Allayne Butcher, RN Phone Number: 01/31/2021, 3:38 PM  Clinical Narrative:    Tentative plan for patient to discharge home with [facility] home health services tomorrow.  She has been doing well working with PT.  Vikki Ports the patient's granddaughter will pick patient up at discharge.    Expected Discharge Plan: Home w Home Health Services Barriers to Discharge: Continued Medical Work up  Expected Discharge Plan and Services Expected Discharge Plan: Home w Home Health Services   Discharge Planning Services: CM Consult Post Acute Care Choice: Home Health Living arrangements for the past 2 months: Single Family Home                 DME Arranged: N/A DME Agency: NA       HH Arranged: RN,PT,OT,Nurse's Aide HH Agency: [facility] Date [facility] Agency Contacted: 01/30/21 Time HH Agency Contacted: 1530 Representative spoke with at [facility] Agency: Kandee Keen   Social Determinants of Health (SDOH) Interventions    Readmission Risk Interventions No flowsheet data found.

## 2021-01-31 NOTE — Care Management Important Message (Signed)
Important Message  Patient Details  Name: KELBIE MORO MRN: [id number] Date of Birth: 1940/09/15   Medicare Important Message Given:  Yes     Olegario Messier A Yerachmiel Spinney 01/31/2021, 10:20 AM

## 2021-02-01 LAB — CBC
HCT: 34.5 % — ABNORMAL LOW (ref 36.0–46.0)
Hemoglobin: 11.5 g/dL — ABNORMAL LOW (ref 12.0–15.0)
MCH: 31.1 pg (ref 26.0–34.0)
MCHC: 33.3 g/dL (ref 30.0–36.0)
MCV: 93.2 fL (ref 80.0–100.0)
Platelets: 321 10*3/uL (ref 150–400)
RBC: 3.7 MIL/uL — ABNORMAL LOW (ref 3.87–5.11)
RDW: 14.5 % (ref 11.5–15.5)
WBC: 11.8 10*3/uL — ABNORMAL HIGH (ref 4.0–10.5)
nRBC: 0.2 % (ref 0.0–0.2)

## 2021-02-01 LAB — CULTURE, BLOOD (ROUTINE X 2): Culture: NO GROWTH

## 2021-02-01 LAB — BASIC METABOLIC PANEL
Anion gap: 7 (ref 5–15)
BUN: 10 mg/dL (ref 8–23)
CO2: 27 mmol/L (ref 22–32)
Calcium: 8.5 mg/dL — ABNORMAL LOW (ref 8.9–10.3)
Chloride: 104 mmol/L (ref 98–111)
Creatinine, Ser: 0.71 mg/dL (ref 0.44–1.00)
GFR, Estimated: >60 mL/min (ref >60)
Glucose, Bld: 128 mg/dL — ABNORMAL HIGH (ref 70–99)
Potassium: 3.8 mmol/L (ref 3.5–5.1)
Sodium: 138 mmol/L (ref 135–145)

## 2021-02-01 LAB — GLUCOSE, CAPILLARY
Glucose-Capillary: 126 mg/dL — ABNORMAL HIGH (ref 70–99)
Glucose-Capillary: 206 mg/dL — ABNORMAL HIGH (ref 70–99)
Glucose-Capillary: 182 mg/dL — ABNORMAL HIGH (ref 70–99)
Glucose-Capillary: 191 mg/dL — ABNORMAL HIGH (ref 70–99)

## 2021-02-01 MED ORDER — POTASSIUM CHLORIDE CRYS ER 20 MEQ PO TBCR
40.0000 meq | EXTENDED_RELEASE_TABLET | Freq: Once | ORAL | Status: AC
  Administered 2021-02-01: 40 meq via ORAL
  Filled 2021-02-01: qty 2

## 2021-02-01 MED ORDER — FUROSEMIDE 10 MG/ML IJ SOLN
20.0000 mg | Freq: Once | INTRAMUSCULAR | Status: AC
  Administered 2021-02-01: 12:00:00 20 mg via INTRAVENOUS
  Filled 2021-02-01: qty 2

## 2021-02-01 MED ORDER — IPRATROPIUM BROMIDE 0.02 % IN SOLN
0.5000 mg | Freq: Four times a day (QID) | RESPIRATORY_TRACT | Status: DC
  Administered 2021-02-01 – 2021-02-02 (×4): 0.5 mg via RESPIRATORY_TRACT
  Filled 2021-02-01 (×4): qty 2.5

## 2021-02-01 NOTE — Discharge Instructions (Signed)
-   Flush each drain once daily with 5-10 cc NS flush (patient will need an order for flushes upon discharge).   - Record output from each drain once daily. - Follow-up at drain clinic 10-14 days after discharge for CT/possible drain injection (assess for possible drain removal)- IR schedulers to call patient to set up this appointment.

## 2021-02-01 NOTE — Progress Notes (Signed)
PROGRESS NOTE    Cynthia Jones  ZOX:[id number] DOB: 23-Nov-1940 DOA: 01/27/2021 PCP: Loura Pardon, MD   Brief Narrative: 68 old with past medical history significant for Parkinson's disease, diabetes type 2, hypertension, hypothyroidism who presented to the ED for evaluation of weakness, dysuria and fever.  Patient was noted to have a temperature of 103.  She was hospitalized and started on IV antibiotics.  CT abdomen and pelvis: Raises concern for diverticulitis with abscess.  General surgery and IR consulted.  Patient underwent CT-guided drainage of diverticular abscess 10 Fr drain in placed.    Assessment & Plan:   Principal Problem:   Sepsis (HCC) Active Problems:   Hypertension associated with diabetes (HCC)   Diabetes mellitus (HCC)   Osteoarthritis   Neuropathy due to secondary diabetes (HCC)   Hypothyroid   Parkinson disease (HCC)   Hyperlipidemia associated with type 2 diabetes mellitus (HCC)   1-Sepsis secondary to acute diverticulitis with abscess: Patient presented with abdominal pain, CT abdomen and pelvis raises concern for diverticulitis with abscess. Surgery and IR consulted. Continue with IV Zosyn.  Underwent CT-guided drainage of diverticular abscess with a 10 Fr drain 6/01 Culture growing E coli, and enterococcus. Plan to discharge on Augmentin and Bactrim. Discussed with surgery plan to complete 14 days antibiotics.  WBC down to 11.   2-Hyponatremia, hypokalemia and hypomagnesemia: Replete Resolved. Monitor.   Diabetes type 2: Continue to hold metformin, continue with sliding scale insulin.  Hypertension: Coreg and losartan held due to initially low blood pressure. Monitor blood pressure  Generalized weakness: PT OT evaluation. Improve, plan to discharge home.   Hypothyroidism: Continue with Synthroid.  History of Parkinson disease: Continue with Sinemet.  Severe obesity: Needs  lifestyle modification  BL Wheezing;  Improved. Repeat dose IV  lasix.  Nebulizer treatments.    Estimated body mass index is 45.25 kg/m as calculated from the following:   Height as of this encounter: 5' (1.524 m).   Weight as of this encounter: 105.1 kg.   DVT prophylaxis: Lovenox Code Status: DNR Family Communication: care discussed with granddaughter 6/01 6/03 over phone Disposition Plan:  Status is: Inpatient  Remains inpatient appropriate because:IV treatments appropriate due to intensity of illness or inability to take PO   Dispo:  Patient From: Home  Planned Disposition: Home with Health Care Svc  Medically stable for discharge: No Home hopefully 6/05          Consultants:   Surgery  IR  Procedures:   CT guide drain place for abscess  Antimicrobials:  On IV Zosyn  Subjective: She is complaining of buttock and back pain.  She report waking up this am with SOB. She doesn't feel ready to go home today.   Objective: Vitals:   02/01/21 0800 02/01/21 1221 02/01/21 1415 02/01/21 1528  BP: (!) 150/64 (!) 164/69  (!) 135/53  Pulse:  80  88  Resp:  (!) 22  16  Temp: 98 F (36.7 C) 98.5 F (36.9 C)  (!) 97.3 F (36.3 C)  TempSrc: Oral Oral  Oral  SpO2:  97% 95% 96%  Weight:      Height:        Intake/Output Summary (Last 24 hours) at 02/01/2021 1543 Last data filed at 02/01/2021 1231 Gross per 24 hour  Intake 20 ml  Output 785 ml  Net -765 ml   Filed Weights   01/27/21 2145 01/28/21 0214  Weight: 104 kg 105.1 kg    Examination:  General exam: NAD  Respiratory system: BL Wheezing.  Cardiovascular system: S 1, S 2 RRR Gastrointestinal system: BS present, soft, drain right lower quadrant in place.  Central nervous system: ALert Extremities: Trace edema   Data Reviewed: I have personally reviewed following labs and imaging studies  CBC: Recent Labs  Lab 01/27/21 2148 01/28/21 0431 01/29/21 0439 01/30/21 0409 01/31/21 0419 02/01/21 0441  WBC 21.2* 26.5* 27.0* 21.0* 12.1* 11.8*  NEUTROABS 17.6*   --   --   --   --   --   HGB 13.4 12.1 11.6* 11.7* 11.2* 11.5*  HCT 38.7 35.0* 34.1* 34.2* 33.5* 34.5*  MCV 90.4 90.2 90.9 91.2 93.6 93.2  PLT 275 249 235 284 274 321   Basic Metabolic Panel: Recent Labs  Lab 01/28/21 0431 01/29/21 0439 01/30/21 0409 01/31/21 0419 02/01/21 0441  NA 134* 132* 135 137 138  K 3.0* 3.7 3.5 3.6 3.8  CL 101 103 103 106 104  CO2 21* GLUCOSE 135* 116* 113* 107* 128*  BUN 9 7* 6* 7* 10  CREATININE 0.80 0.74 0.63 0.64 0.71  CALCIUM 8.2* 8.2* 8.1* 8.1* 8.5*  MG 1.5* 2.3 2.1  --   --    GFR: Estimated Creatinine Clearance: 63.4 mL/min (by C-G formula based on SCr of 0.71 mg/dL). Liver Function Tests: Recent Labs  Lab 01/27/21 2148 01/28/21 0431  AST 20 17  ALT 6 10  ALKPHOS 42 38  BILITOT 1.0 1.1  PROT 7.6 6.5  ALBUMIN 3.4* 3.0*   No results for input(s): LIPASE, AMYLASE in the last 168 hours. No results for input(s): AMMONIA in the last 168 hours. Coagulation Profile: Recent Labs  Lab 01/27/21 2148  INR 1.1   Cardiac Enzymes: No results for input(s): CKTOTAL, CKMB, CKMBINDEX, TROPONINI in the last 168 hours. BNP (last 3 results) No results for input(s): PROBNP in the last 8760 hours. HbA1C: No results for input(s): HGBA1C in the last 72 hours. CBG: Recent Labs  Lab 01/31/21 1613 01/31/21 2128 02/01/21 0814 02/01/21 1221 02/01/21 1527  GLUCAP 205* 175* 126* 206* 182*   Lipid Profile: No results for input(s): CHOL, HDL, LDLCALC, TRIG, CHOLHDL, LDLDIRECT in the last 72 hours. Thyroid Function Tests: No results for input(s): TSH, T4TOTAL, FREET4, T3FREE, THYROIDAB in the last 72 hours. Anemia Panel: No results for input(s): VITAMINB12, FOLATE, FERRITIN, TIBC, IRON, RETICCTPCT in the last 72 hours. Sepsis Labs: Recent Labs  Lab 01/27/21 2148 01/27/21 2208 01/28/21 0431  PROCALCITON  --   --  0.22  LATICACIDVEN 1.3 1.3  --     Recent Results (from the past 240 hour(s))  Blood Culture (routine x 2)     Status:  None   Collection Time: 01/27/21  9:48 PM   Specimen: BLOOD  Result Value Ref Range Status   Specimen Description BLOOD RIGHT [facility]  Final   Special Requests   Final    BOTTLES DRAWN AEROBIC AND ANAEROBIC Blood Culture results may not be optimal due to an excessive volume of blood received in culture bottles   Culture   Final    NO GROWTH 5 DAYS Performed at [facility], [address] [place], [place] [address]    Report Status 02/01/2021 FINAL  Final  Blood Culture (routine x 2)     Status: Abnormal   Collection Time: 01/27/21 10:33 PM   Specimen: BLOOD  Result Value Ref Range Status   Specimen Description   Final    BLOOD LEFT HAND Performed at [employer] [facility], [address] [place], [place] [address]    Special Requests   Final    BOTTLES DRAWN AEROBIC AND ANAEROBIC Blood Culture results may not be optimal due to an inadequate volume of blood received in culture bottles Performed at [facility] [address] [place], [place] [address]    Culture  Setup Time   Final    GRAM POSITIVE COCCI ANAEROBIC BOTTLE ONLY CRITICAL RESULT CALLED TO, READ BACK BY AND VERIFIED WITH: WALLY RANAZIRI 01/28/21 1736 KLW    Culture (A)  Final    STAPHYLOCOCCUS EPIDERMIDIS THE SIGNIFICANCE OF ISOLATING THIS ORGANISM FROM A SINGLE SET OF BLOOD CULTURES WHEN MULTIPLE SETS ARE DRAWN IS UNCERTAIN. PLEASE NOTIFY THE MICROBIOLOGY DEPARTMENT WITHIN ONE WEEK IF SPECIATION AND SENSITIVITIES ARE REQUIRED. Performed at [facility], [address], [place], [place] [address]    Report Status 01/30/2021 FINAL  Final  Blood Culture ID Panel (Reflexed)     Status: Abnormal   Collection Time: 01/27/21 10:33 PM  Result Value Ref Range Status   Enterococcus faecalis NOT DETECTED NOT DETECTED Final   Enterococcus Faecium NOT DETECTED NOT DETECTED Final   Listeria monocytogenes NOT DETECTED NOT DETECTED Final   Staphylococcus species DETECTED (A) NOT DETECTED Final     Comment: CRITICAL RESULT CALLED TO, READ BACK BY AND VERIFIED WITH: WALLY RANAZIRI 01/28/21 1736 KLW    Staphylococcus aureus (BCID) NOT DETECTED NOT DETECTED Final   Staphylococcus epidermidis DETECTED (A) NOT DETECTED Final    Comment: CRITICAL RESULT CALLED TO, READ BACK BY AND VERIFIED WITH: WALLY RANAZIRI 01/28/21 1736 KLW    Staphylococcus lugdunensis NOT DETECTED NOT DETECTED Final   Streptococcus species NOT DETECTED NOT DETECTED Final   Streptococcus agalactiae NOT DETECTED NOT DETECTED Final   Streptococcus pneumoniae NOT DETECTED NOT DETECTED Final   Streptococcus pyogenes NOT DETECTED NOT DETECTED Final   A.calcoaceticus-baumannii NOT DETECTED NOT DETECTED Final   Bacteroides fragilis NOT DETECTED NOT DETECTED Final   Enterobacterales NOT DETECTED NOT DETECTED Final   Enterobacter cloacae complex NOT DETECTED NOT DETECTED Final   Escherichia coli NOT DETECTED NOT DETECTED Final   Klebsiella aerogenes NOT DETECTED NOT DETECTED Final   Klebsiella oxytoca NOT DETECTED NOT DETECTED Final   Klebsiella pneumoniae NOT DETECTED NOT DETECTED Final   Proteus species NOT DETECTED NOT DETECTED Final   Salmonella species NOT DETECTED NOT DETECTED Final   Serratia marcescens NOT DETECTED NOT DETECTED Final   Haemophilus influenzae NOT DETECTED NOT DETECTED Final   Neisseria meningitidis NOT DETECTED NOT DETECTED Final   Pseudomonas aeruginosa NOT DETECTED NOT DETECTED Final   Stenotrophomonas maltophilia NOT DETECTED NOT DETECTED Final   Candida albicans NOT DETECTED NOT DETECTED Final   Candida auris NOT DETECTED NOT DETECTED Final   Candida glabrata NOT DETECTED NOT DETECTED Final   Candida krusei NOT DETECTED NOT DETECTED Final   Candida parapsilosis NOT DETECTED NOT DETECTED Final   Candida tropicalis NOT DETECTED NOT DETECTED Final   Cryptococcus neoformans/gattii NOT DETECTED NOT DETECTED Final   Methicillin resistance mecA/C NOT DETECTED NOT DETECTED Final    Comment: Performed  at [facility], [address] [place], [place] [address]  Resp Panel by RT-PCR (Flu A&B, Covid) Nasopharyngeal Swab     Status: None   Collection Time: 01/27/21 10:34 PM   Specimen: Nasopharyngeal Swab; Nasopharyngeal(NP) swabs in vial transport medium  Result Value Ref Range Status   SARS Coronavirus 2 by RT PCR NEGATIVE NEGATIVE Final  Comment: (NOTE) SARS-CoV-2 target nucleic acids are NOT DETECTED.  The SARS-CoV-2 RNA is generally detectable in upper respiratory specimens during the acute phase of infection. The lowest concentration of SARS-CoV-2 viral copies this assay can detect is 138 copies/mL. A negative result does not preclude SARS-Cov-2 infection and should not be used as the sole basis for treatment or other patient management decisions. A negative result may occur with  improper specimen collection/handling, submission of specimen other than nasopharyngeal swab, presence of viral mutation(s) within the areas targeted by this assay, and inadequate number of viral copies(<138 copies/mL). A negative result must be combined with clinical observations, patient history, and epidemiological information. The expected result is Negative.  Fact Sheet for Patients:  BloggerCourse.com  Fact Sheet for Healthcare Providers:  SeriousBroker.it  This test is no t yet approved or cleared by the [place] FDA and  has been authorized for detection and/or diagnosis of SARS-CoV-2 by FDA under an Emergency Use Authorization (EUA). This EUA will remain  in effect (meaning this test can be used) for the duration of the COVID-19 declaration under Section 564(b)(1) of the Act, 21 U.S.C.section 360bbb-3(b)(1), unless the authorization is terminated  or revoked sooner.       Influenza A by PCR NEGATIVE NEGATIVE Final   Influenza B by PCR NEGATIVE NEGATIVE Final    Comment: (NOTE) The Xpert Xpress SARS-CoV-2/FLU/RSV plus  assay is intended as an aid in the diagnosis of influenza from Nasopharyngeal swab specimens and should not be used as a sole basis for treatment. Nasal washings and aspirates are unacceptable for Xpert Xpress SARS-CoV-2/FLU/RSV testing.  Fact Sheet for Patients: BloggerCourse.com  Fact Sheet for Healthcare Providers: SeriousBroker.it  This test is not yet approved or cleared by the [place] FDA and has been authorized for detection and/or diagnosis of SARS-CoV-2 by FDA under an Emergency Use Authorization (EUA). This EUA will remain in effect (meaning this test can be used) for the duration of the COVID-19 declaration under Section 564(b)(1) of the Act, 21 U.S.C. section 360bbb-3(b)(1), unless the authorization is terminated or revoked.  Performed at [facility], [address], [place], [place] [address]   Urine culture     Status: None   Collection Time: 01/28/21  6:01 AM   Specimen: Urine, Random  Result Value Ref Range Status   Specimen Description   Final    URINE, RANDOM Performed at [facility], [address] [place], [place] [address]    Special Requests   Final    NONE Performed at [facility], [address] [place], [place] [address]    Culture   Final    NO GROWTH Performed at [facility], [address] [place], [place] [address]    Report Status 01/29/2021 FINAL  Final  Aerobic/Anaerobic Culture (surgical/deep wound)     Status: None (Preliminary result)   Collection Time: 01/29/21 12:42 PM   Specimen: Abscess  Result Value Ref Range Status   Specimen Description   Final    ABSCESS Performed at [facility], [address], [place], [place] [address]    Special Requests   Final    Normal Performed at [facility], [address] [place], [place] [address]    Gram Stain   Final    ABUNDANT WBC PRESENT, PREDOMINANTLY PMN ABUNDANT  GRAM NEGATIVE RODS ABUNDANT GRAM POSITIVE COCCI IN PAIRS IN CHAINS ABUNDANT GRAM POSITIVE RODS    Culture   Final  ABUNDANT ESCHERICHIA COLI ABUNDANT ENTEROCOCCUS AVIUM HOLDING FOR POSSIBLE ANAEROBE ABUNDANT BACTEROIDES SPECIES BETA LACTAMASE POSITIVE Performed at [facility], [address] [place], [place] [address]    Report Status PENDING  Incomplete   Organism ID, Bacteria ESCHERICHIA COLI  Final   Organism ID, Bacteria ENTEROCOCCUS AVIUM  Final      Susceptibility   Enterococcus avium - MIC*    AMPICILLIN <=2 SENSITIVE Sensitive     VANCOMYCIN <=0.5 SENSITIVE Sensitive     GENTAMICIN SYNERGY SENSITIVE Sensitive     * ABUNDANT ENTEROCOCCUS AVIUM   Escherichia coli - MIC*    AMPICILLIN >=32 RESISTANT Resistant     CEFAZOLIN 8 SENSITIVE Sensitive     CEFEPIME <=0.12 SENSITIVE Sensitive     CEFTAZIDIME <=1 SENSITIVE Sensitive     CEFTRIAXONE <=0.25 SENSITIVE Sensitive     CIPROFLOXACIN <=0.25 SENSITIVE Sensitive     GENTAMICIN <=1 SENSITIVE Sensitive     IMIPENEM <=0.25 SENSITIVE Sensitive     TRIMETH/SULFA <=20 SENSITIVE Sensitive     AMPICILLIN/SULBACTAM >=32 RESISTANT Resistant     PIP/TAZO <=4 SENSITIVE Sensitive     * ABUNDANT ESCHERICHIA COLI         Radiology Studies: No results found.      Scheduled Meds: . albuterol  2.5 mg Nebulization Q6H  . carbidopa-levodopa  1 tablet Oral QID  . DULoxetine  60 mg Oral Daily  . enoxaparin (LOVENOX) injection  0.5 mg/kg Subcutaneous Q24H  . guaiFENesin  600 mg Oral BID  . insulin aspart  0-5 Units Subcutaneous QHS  . insulin aspart  0-9 Units Subcutaneous TID WC  . ipratropium  0.5 mg Nebulization Q6H  . levothyroxine  50 mcg Oral Q0600  . mirtazapine  7.5 mg Oral QHS  . pregabalin  25 mg Oral Daily  . sodium chloride flush  3 mL Intravenous Q12H  . sodium chloride flush  5 mL Intracatheter Q8H   Continuous Infusions: . piperacillin-tazobactam (ZOSYN)  IV 3.375 g (02/01/21 1404)     LOS: 4 days     Time spent: 35 minutes    Emilo Gras A Hershel Corkery, MD [facility]   If 7PM-7AM, please contact night-coverage [email or web address]  02/01/2021, 3:43 PM

## 2021-02-01 NOTE — Progress Notes (Signed)
IR.  History of colonic diverticulitis with abscess formation s/p RLQ drain placement in IR 01/29/2021.  If patient is to be discharged, below are discharge instructions: - Flush each drain once daily with 5-10 cc NS flush (patient will need an order for flushes upon discharge). RN aware to teach patient how to manage drains at home. - Record output from each drain once daily. - Follow-up at drain clinic 10-14 days after discharge for CT/possible drain injection (assess for possible drain removal)- IR schedulers to call patient to set up this appointment.  Please call IR with questions/concerns.   Waylan Boga Oumar Marcott, PA-C 02/01/2021, 8:52 AM

## 2021-02-02 LAB — CBC
HCT: 36 % (ref 36.0–46.0)
Hemoglobin: 11.8 g/dL — ABNORMAL LOW (ref 12.0–15.0)
MCH: 30.7 pg (ref 26.0–34.0)
MCHC: 32.8 g/dL (ref 30.0–36.0)
MCV: 93.8 fL (ref 80.0–100.0)
Platelets: 369 10*3/uL (ref 150–400)
RBC: 3.84 MIL/uL — ABNORMAL LOW (ref 3.87–5.11)
RDW: 14.6 % (ref 11.5–15.5)
WBC: 11.6 10*3/uL — ABNORMAL HIGH (ref 4.0–10.5)
nRBC: 0 % (ref 0.0–0.2)

## 2021-02-02 LAB — BASIC METABOLIC PANEL
Anion gap: 9 (ref 5–15)
BUN: 10 mg/dL (ref 8–23)
CO2: 26 mmol/L (ref 22–32)
Calcium: 8.6 mg/dL — ABNORMAL LOW (ref 8.9–10.3)
Chloride: 102 mmol/L (ref 98–111)
Creatinine, Ser: 0.82 mg/dL (ref 0.44–1.00)
GFR, Estimated: >60 mL/min (ref >60)
Glucose, Bld: 114 mg/dL — ABNORMAL HIGH (ref 70–99)
Potassium: 4 mmol/L (ref 3.5–5.1)
Sodium: 137 mmol/L (ref 135–145)

## 2021-02-02 LAB — GLUCOSE, CAPILLARY
Glucose-Capillary: 98 mg/dL (ref 70–99)
Glucose-Capillary: 159 mg/dL — ABNORMAL HIGH (ref 70–99)

## 2021-02-02 MED ORDER — IPRATROPIUM-ALBUTEROL 20-100 MCG/ACT IN AERS: 1.0000 | INHALATION_SPRAY | Freq: Four times a day (QID) | RESPIRATORY_TRACT | 0 refills | Status: DC

## 2021-02-02 MED ORDER — GUAIFENESIN ER 600 MG PO TB12: 600.0000 mg | ORAL_TABLET | Freq: Two times a day (BID) | ORAL | 0 refills | Status: DC

## 2021-02-02 MED ORDER — HYDROCODONE-ACETAMINOPHEN 5-325 MG PO TABS: 1.0000 | ORAL_TABLET | Freq: Four times a day (QID) | ORAL | 0 refills | Status: AC | PRN

## 2021-02-02 MED ORDER — SULFAMETHOXAZOLE-TRIMETHOPRIM 800-160 MG PO TABS: 1.0000 | ORAL_TABLET | Freq: Two times a day (BID) | ORAL | 0 refills | Status: AC

## 2021-02-02 MED ORDER — AMOXICILLIN-POT CLAVULANATE 875-125 MG PO TABS: 1.0000 | ORAL_TABLET | Freq: Two times a day (BID) | ORAL | 0 refills | Status: AC

## 2021-02-02 NOTE — Discharge Summary (Signed)
Physician Discharge Summary  Cynthia Jones YHC:[id number] DOB: Jul 09, 1941 DOA: 01/27/2021  PCP: Loura Pardon, MD  Admit date: 01/27/2021 Discharge date: 02/02/2021  Admitted From: Home  Disposition:  Home   Recommendations for Outpatient Follow-up:  1. Follow up with PCP in 1-2 weeks 2. Please obtain BMP/CBC in one week 3. Needs to follow up with IR drain clinic for further care of drain.  4. Needs to follow up with surgery for resolution of abscess.   Home Health: Yes.   Discharge Condition: Stable.  CODE STATUS: Full Code.  Diet recommendation: Heart Healthy  Brief/Interim Summary: 65 old with past medical history significant for Parkinson's disease, diabetes type 2, hypertension, hypothyroidism who presented to the ED for evaluation of weakness, dysuria and fever.  Patient was noted to have a temperature of 103.  She was hospitalized and started on IV antibiotics.  CT abdomen and pelvis: Raises concern for diverticulitis with abscess.  General surgery and IR consulted.  Patient underwent CT-guided drainage of diverticular abscess 10 Fr drain in placed. Patient will be discharge on 10 days of antibiotics, to complete 2 weeks.    1-Sepsis secondary to acute diverticulitis with abscess: Patient presented with abdominal pain, CT abdomen and pelvis raises concern for diverticulitis with abscess. Surgery and IR consulted. Continue with IV Zosyn.  Underwent CT-guided drainage of diverticular abscess with a 10 Fr drain 6/01 Culture growing E coli, and enterococcus. Plan to discharge on Augmentin and Bactrim. Discussed with surgery plan to complete 14 days antibiotics. Patient will be discharge on 10 days antibiotics.  WBC down to 11.   2-Hyponatremia, hypokalemia and hypomagnesemia: Replete Resolved. Monitor.   Diabetes type 2: Resume metformin at discharge.   Hypertension: Coreg and losartan held due to initially low blood pressure. Plan to resume Losartan at discharge.    Generalized weakness: PT OT evaluation. Improve, plan to discharge home.   Hypothyroidism: Continue with Synthroid.  History of Parkinson disease: Continue with Sinemet.  Severe obesity: Needs  lifestyle modification  BL Wheezing;  Improved. Received lasix times 2.  Nebulizer treatments.    Discharge Diagnoses:  Principal Problem:   Sepsis (HCC) Active Problems:   Hypertension associated with diabetes (HCC)   Diabetes mellitus (HCC)   Osteoarthritis   Neuropathy due to secondary diabetes (HCC)   Hypothyroid   Parkinson disease (HCC)   Hyperlipidemia associated with type 2 diabetes mellitus [facility])    Discharge Instructions  Discharge Instructions    Diet - low sodium heart healthy   Complete by: As directed    Discharge wound care:   Complete by: As directed    - Flush each drain once daily with 5-10 cc NS flush (patient will need an order for flushes upon discharge).   - Record output from each drain once daily. - Follow-up at drain clinic 10-14 days after discharge for CT/possible drain injection (assess for possible drain removal)- IR schedulers to call patient to set up this appointment.   Increase activity slowly   Complete by: As directed      Allergies as of 02/02/2021      Reactions   Nsaids Other (See Comments)   GI upset, hx of H pylori and gastric ulcer   Erythromycin Nausea Only      Medication List    STOP taking these medications   carvedilol 6.25 MG tablet Commonly known as: COREG   esomeprazole 40 MG capsule Commonly known as: NEXIUM   hydrochlorothiazide 12.5 MG tablet Commonly known as: HYDRODIURIL  ondansetron 8 MG tablet Commonly known as: Zofran   sucralfate 1 g tablet Commonly known as: CARAFATE   UNABLE TO FIND     TAKE these medications   amoxicillin-clavulanate 875-125 MG tablet Commonly known as: Augmentin Take 1 tablet by mouth 2 (two) times daily for 10 days.   b complex vitamins tablet Take 1 tablet by  mouth daily.   carbidopa-levodopa 25-100 MG tablet Commonly known as: SINEMET IR Take 1 tablet by mouth 4 (four) times daily.   diclofenac Sodium 1 % Gel Commonly known as: VOLTAREN Apply 2 g topically as needed.   DULoxetine 60 MG capsule Commonly known as: CYMBALTA Take 60 mg by mouth daily.   guaiFENesin 600 MG 12 hr tablet Commonly known as: MUCINEX Take 1 tablet (600 mg total) by mouth 2 (two) times daily.   HYDROcodone-acetaminophen 5-325 MG tablet Commonly known as: NORCO/VICODIN Take 1-2 tablets by mouth every 6 (six) hours as needed for up to 3 days for moderate pain.   Ipratropium-Albuterol 20-100 MCG/ACT Aers respimat Commonly known as: COMBIVENT Inhale 1 puff into the lungs every 6 (six) hours.   levothyroxine 50 MCG tablet Commonly known as: SYNTHROID TAKE 1 TABLET BY MOUTH EVERY DAY   lidocaine 5 % Commonly known as: Lidoderm Place 1 patch onto the skin daily. Remove & Discard patch within 12 hours or as directed by MD   losartan 100 MG tablet Commonly known as: COZAAR TAKE 1 TABLET BY MOUTH DAILY   metFORMIN 500 MG tablet Commonly known as: GLUCOPHAGE Take 1 tablet (500 mg total) by mouth daily with breakfast.   mirtazapine 7.5 MG tablet Commonly known as: REMERON TAKE 1 TABLET BY MOUTH AT BEDTIME.   nystatin powder Commonly known as: MYCOSTATIN/NYSTOP APPLY TO AFFECTED AREA 3 TIMES A DAY   pregabalin 25 MG capsule Commonly known as: Lyrica Take 1 capsule (25 mg total) by mouth daily.   sulfamethoxazole-trimethoprim 800-160 MG tablet Commonly known as: BACTRIM DS Take 1 tablet by mouth 2 (two) times daily for 10 days.   Vitamin D (Ergocalciferol) 1.25 MG (50000 UNIT) Caps capsule Commonly known as: DRISDOL TAKE 1 CAPSULE (50,000 UNITS TOTAL) BY MOUTH EVERY 7 (SEVEN) DAYS            Discharge Care Instructions  (From admission, onward)         Start     Ordered   02/02/21 0000  Discharge wound care:       Comments: - Flush each  drain once daily with 5-10 cc NS flush (patient will need an order for flushes upon discharge).   - Record output from each drain once daily. - Follow-up at drain clinic 10-14 days after discharge for CT/possible drain injection (assess for possible drain removal)- IR schedulers to call patient to set up this appointment.   02/02/21 1049          Contact information for follow-up providers    Carolan Shiver, MD Follow up in 1 week(s).   Specialty: General Surgery Why: Follow up diverticulitis with drain Contact information: [address] [place] [address] [phone]        Irish Lack, MD Follow up in 14 day(s).   Specialties: Interventional Radiology, Radiology Why: Please follow-up at IR drain clinic 10-14 days after discharge. Our office will call you to set up this appointment. Contact information: [address] [place] [address] [phone]            Contact information for  after-discharge care    Destination    [facility] .   Service: Skilled Nursing Contact information: [address] [place] [address] [phone]                 Allergies  Allergen Reactions  . Nsaids Other (See Comments)    GI upset, hx of H pylori and gastric ulcer  . Erythromycin Nausea Only    Consultations:  IR  General surgery.      Procedures/Studies: CT ABDOMEN PELVIS WO CONTRAST  Result Date: 01/28/2021 CLINICAL DATA:  Abdominal pain and fever EXAM: CT ABDOMEN AND PELVIS WITHOUT CONTRAST TECHNIQUE: Multidetector CT imaging of the abdomen and pelvis was performed following the standard protocol without IV contrast. Oral contrast administered. COMPARISON:  None. FINDINGS: Lower chest: There is mild bibasilar atelectatic change. There are foci of coronary artery calcification. Hepatobiliary: There is hepatic steatosis. No focal  liver lesions are appreciable on this noncontrast enhanced study. Gallbladder is absent. There is no appreciable biliary duct dilatation. Pancreas: There is no pancreatic mass or inflammatory focus. Spleen: No splenic lesions are evident. Adrenals/Urinary Tract: Adrenals bilaterally appear normal. There is mild perinephric stranding bilaterally without fluid collection. There is no evident renal mass or hydronephrosis on either side. No appreciable renal or ureteral calculus on either side. Urinary bladder is midline with wall thickness within normal limits. Stomach/Bowel: There are multiple sigmoid diverticula. There is wall thickening in the mid sigmoid colon consistent with a degree of diverticulitis. There is a fluid collection containing air and mildly thickened wall which appears to arise from the sigmoid in the mid pelvic region immediately to the right of midline measuring 6.3 x 5.9 x 5.0 cm, an apparent peridiverticular abscess. No other similar changes. Elsewhere there is no bowel wall thickening or bowel obstruction. The terminal ileum appears normal. No periappendiceal region inflammation. No free air or portal venous air. Vascular/Lymphatic: There is aortic atherosclerosis. No abdominal aortic aneurysm. No evident adenopathy in the abdomen or pelvis. Reproductive: Uterus absent. No adnexal mass evident. Note that the diverticular abscess may abut right ovarian tissue. Other: There is an umbilical region hernia containing fat but no bowel. No evident ascites in the abdomen or pelvis. Musculoskeletal: Degenerative changes noted in the lower thoracic and lumbar regions. There is evidence spinal stenosis at L2-3 due to bony hypertrophy and disc protrusion. Similar changes are noted at L3-4 and to a lesser degree at L4-5. No blastic or lytic bone lesions. No intramuscular lesions. Soft tissue thickening is noted in the right lateral abdominal region. IMPRESSION: 1. Mid to distal sigmoid diverticulitis.  Apparent diverticular abscess arising posteriorly in the mid pelvic region slightly to the right of midline. This fluid collection which contains air and a mildly thickened wall appears to arise from the sigmoid colon. This apparent abscess measures 6.3 x 5.9 x 5.0 cm. 2. No bowel obstruction. No other abscess in the abdomen or pelvis. No evident free air. No periappendiceal region inflammation. 3. Soft tissue stranding in each perinephric region without fluid collection or evidence of renal abscess. No renal or ureteral calculi on either side. No hydronephrosis. Urinary bladder wall thickness normal. 4.  Umbilical hernia containing fat but no bowel. 5. Aortic Atherosclerosis (ICD10-I70.0). There are also foci of coronary artery calcification. 6. Spinal stenosis at L2-3 and to a lesser degree at L3-4 and L4-5 due to disc protrusion and bony hypertrophy. 7.  Hepatic steatosis. 8. Soft tissue  thickening in the lateral right abdominal wall which potentially may represent resolving hematoma. No well-defined fluid collection in this area. These results will be called to the ordering clinician or representative by the Radiologist Assistant, and communication documented in the PACS or [employer]. Electronically Signed   By: Bretta Bang III M.D.   On: 01/28/2021 14:09   CT Head Wo Contrast  Result Date: 01/27/2021 CLINICAL DATA:  Head trauma fell from chair EXAM: CT HEAD WITHOUT CONTRAST TECHNIQUE: Contiguous axial images were obtained from the base of the skull through the vertex without intravenous contrast. COMPARISON:  CT brain 11/20/2019 FINDINGS: Brain: No acute territorial infarction, hemorrhage, or intracranial mass. Mild atrophy. Patchy white matter hypodensity consistent with chronic small vessel ischemic change. Nonenlarged ventricles Vascular: No hyperdense vessels.  Carotid vascular calcification Skull: Normal. Negative for fracture or focal lesion. Sinuses/Orbits: No acute finding. Lobulated  mucosal thickening in the sinuses Other: None IMPRESSION: 1. No CT evidence for acute intracranial abnormality. 2. Atrophy and mild chronic small vessel ischemic change of the white matter Electronically Signed   By: Jasmine Pang M.D.   On: 01/27/2021 23:22   DG Chest Port 1 View  Result Date: 01/27/2021 CLINICAL DATA:  Weakness, fell from chair, fever, lower abdominal pain EXAM: PORTABLE CHEST 1 VIEW COMPARISON:  07/09/2016 FINDINGS: The heart size and mediastinal contours are within normal limits. Both lungs are clear. The visualized skeletal structures are unremarkable. IMPRESSION: No active disease. Electronically Signed   By: Sharlet Salina M.D.   On: 01/27/2021 22:02   CT IMAGE GUIDED DRAINAGE BY PERCUTANEOUS CATHETER  Result Date: 01/29/2021 CLINICAL DATA:  Colonic diverticulitis with abscess formation in the central pelvis posterior and inferior to the sigmoid colon. EXAM: CT GUIDED CATHETER DRAINAGE OF COLONIC DIVERTICULAR ABSCESS ANESTHESIA/SEDATION: 2.0 mg IV Versed 100 mcg IV Fentanyl Total Moderate Sedation Time:  20 minutes The patient's level of consciousness and physiologic status were continuously monitored during the procedure by Radiology nursing. PROCEDURE: The procedure, risks, benefits, and alternatives were explained to the patient. Questions regarding the procedure were encouraged and answered. The patient understands and consents to the procedure. A time out was performed prior to initiating the procedure. CT was performed through the pelvis in a supine position. The right lower abdominal wall was prepped with chlorhexidine in a sterile fashion, and a sterile drape was applied covering the operative field. A sterile gown and sterile gloves were used for the procedure. Local anesthesia was provided with 1% Lidocaine. Under CT guidance, an 18 gauge trocar needle was advanced to the level of a diverticular abscess in the central pelvis from a right-sided approach. After confirming  needle tip position and with return of fluid, a guidewire was advanced and the needle removed. The percutaneous tract was dilated and a 10 French percutaneous drainage catheter advanced. Drain position was confirmed by CT. A sample of fluid was sent for culture analysis. The drain was attached to a suction bulb and secured at the skin with a Prolene retention suture and adhesive StatLock device. COMPLICATIONS: None FINDINGS: After needle access of the deep diverticular abscess, there was return of purulent and foul-smelling fluid. After drain placement, there was rapid return of purulent fluid. A sample was sent for culture analysis. IMPRESSION: CT-guided percutaneous catheter drainage of pelvic colonic diverticular abscess. A 10 French percutaneous drain was placed within the abscess and attached to suction bulb drainage. A sample of purulent fluid was sent for culture analysis. Electronically Signed   By: Sherrine Maples  Fredia Sorrow M.D.   On: 01/29/2021 13:13     Subjective: She denies back pain. She is breathing well. She feels ready to go home  Discharge Exam: Vitals:   02/02/21 0738 02/02/21 1118  BP:  (!) 144/54  Pulse:  75  Resp:  16  Temp:  98.3 F (36.8 C)  SpO2: 93% 97%     General: Pt is alert, awake, not in acute distress Cardiovascular: RRR, S1/S2 +, no rubs, no gallops Respiratory: CTA bilaterally, no wheezing, no rhonchi Abdominal: Soft, NT, ND, bowel sounds +, drain RLQ in place.  Extremities: no edema, no cyanosis    The results of significant diagnostics from this hospitalization (including imaging, microbiology, ancillary and laboratory) are listed below for reference.     Microbiology: Recent Results (from the past 240 hour(s))  Blood Culture (routine x 2)     Status: None   Collection Time: 01/27/21  9:48 PM   Specimen: BLOOD  Result Value Ref Range Status   Specimen Description BLOOD RIGHT AC  Final   Special Requests   Final    BOTTLES DRAWN AEROBIC AND ANAEROBIC  Blood Culture results may not be optimal due to an excessive volume of blood received in culture bottles   Culture   Final    NO GROWTH 5 DAYS Performed at [facility], [address], [place], [place] [address]    Report Status 02/01/2021 FINAL  Final  Blood Culture (routine x 2)     Status: Abnormal   Collection Time: 01/27/21 10:33 PM   Specimen: BLOOD  Result Value Ref Range Status   Specimen Description   Final    BLOOD LEFT HAND Performed at [facility], [address], [place], [place] [address]    Special Requests   Final    BOTTLES DRAWN AEROBIC AND ANAEROBIC Blood Culture results may not be optimal due to an inadequate volume of blood received in culture bottles Performed at [facility], [address], [place], [place] [address]    Culture  Setup Time   Final    GRAM POSITIVE COCCI ANAEROBIC BOTTLE ONLY CRITICAL RESULT CALLED TO, READ BACK BY AND VERIFIED WITH: WALLY RANAZIRI 01/28/21 1736 KLW    Culture (A)  Final    STAPHYLOCOCCUS EPIDERMIDIS THE SIGNIFICANCE OF ISOLATING THIS ORGANISM FROM A SINGLE SET OF BLOOD CULTURES WHEN MULTIPLE SETS ARE DRAWN IS UNCERTAIN. PLEASE NOTIFY THE MICROBIOLOGY DEPARTMENT WITHIN ONE WEEK IF SPECIATION AND SENSITIVITIES ARE REQUIRED. Performed at [facility], [address] [place], [place] [address]    Report Status 01/30/2021 FINAL  Final  Blood Culture ID Panel (Reflexed)     Status: Abnormal   Collection Time: 01/27/21 10:33 PM  Result Value Ref Range Status   Enterococcus faecalis NOT DETECTED NOT DETECTED Final   Enterococcus Faecium NOT DETECTED NOT DETECTED Final   Listeria monocytogenes NOT DETECTED NOT DETECTED Final   Staphylococcus species DETECTED (A) NOT DETECTED Final    Comment: CRITICAL RESULT CALLED TO, READ BACK BY AND VERIFIED WITH: WALLY RANAZIRI 01/28/21 1736 KLW    Staphylococcus aureus (BCID) NOT DETECTED NOT DETECTED Final   Staphylococcus epidermidis DETECTED (A) NOT  DETECTED Final    Comment: CRITICAL RESULT CALLED TO, READ BACK BY AND VERIFIED WITH: WALLY RANAZIRI 01/28/21 1736 KLW    Staphylococcus lugdunensis NOT DETECTED NOT DETECTED Final   Streptococcus species NOT DETECTED NOT DETECTED Final   Streptococcus agalactiae NOT DETECTED NOT DETECTED Final   Streptococcus pneumoniae  NOT DETECTED NOT DETECTED Final   Streptococcus pyogenes NOT DETECTED NOT DETECTED Final   A.calcoaceticus-baumannii NOT DETECTED NOT DETECTED Final   Bacteroides fragilis NOT DETECTED NOT DETECTED Final   Enterobacterales NOT DETECTED NOT DETECTED Final   Enterobacter cloacae complex NOT DETECTED NOT DETECTED Final   Escherichia coli NOT DETECTED NOT DETECTED Final   Klebsiella aerogenes NOT DETECTED NOT DETECTED Final   Klebsiella oxytoca NOT DETECTED NOT DETECTED Final   Klebsiella pneumoniae NOT DETECTED NOT DETECTED Final   Proteus species NOT DETECTED NOT DETECTED Final   Salmonella species NOT DETECTED NOT DETECTED Final   Serratia marcescens NOT DETECTED NOT DETECTED Final   Haemophilus influenzae NOT DETECTED NOT DETECTED Final   Neisseria meningitidis NOT DETECTED NOT DETECTED Final   Pseudomonas aeruginosa NOT DETECTED NOT DETECTED Final   Stenotrophomonas maltophilia NOT DETECTED NOT DETECTED Final   Candida albicans NOT DETECTED NOT DETECTED Final   Candida auris NOT DETECTED NOT DETECTED Final   Candida glabrata NOT DETECTED NOT DETECTED Final   Candida krusei NOT DETECTED NOT DETECTED Final   Candida parapsilosis NOT DETECTED NOT DETECTED Final   Candida tropicalis NOT DETECTED NOT DETECTED Final   Cryptococcus neoformans/gattii NOT DETECTED NOT DETECTED Final   Methicillin resistance mecA/C NOT DETECTED NOT DETECTED Final    Comment: Performed at [facility], [address] [place], [place] [address]  Resp Panel by RT-PCR (Flu A&B, Covid) Nasopharyngeal Swab     Status: None   Collection Time: 01/27/21 10:34 PM   Specimen: Nasopharyngeal  Swab; Nasopharyngeal(NP) swabs in vial transport medium  Result Value Ref Range Status   SARS Coronavirus 2 by RT PCR NEGATIVE NEGATIVE Final    Comment: (NOTE) SARS-CoV-2 target nucleic acids are NOT DETECTED.  The SARS-CoV-2 RNA is generally detectable in upper respiratory specimens during the acute phase of infection. The lowest concentration of SARS-CoV-2 viral copies this assay can detect is 138 copies/mL. A negative result does not preclude SARS-Cov-2 infection and should not be used as the sole basis for treatment or other patient management decisions. A negative result may occur with  improper specimen collection/handling, submission of specimen other than nasopharyngeal swab, presence of viral mutation(s) within the areas targeted by this assay, and inadequate number of viral copies(<138 copies/mL). A negative result must be combined with clinical observations, patient history, and epidemiological information. The expected result is Negative.  Fact Sheet for Patients:  BloggerCourse.com  Fact Sheet for Healthcare Providers:  SeriousBroker.it  This test is no t yet approved or cleared by the [place] FDA and  has been authorized for detection and/or diagnosis of SARS-CoV-2 by FDA under an Emergency Use Authorization (EUA). This EUA will remain  in effect (meaning this test can be used) for the duration of the COVID-19 declaration under Section 564(b)(1) of the Act, 21 U.S.C.section 360bbb-3(b)(1), unless the authorization is terminated  or revoked sooner.       Influenza A by PCR NEGATIVE NEGATIVE Final   Influenza B by PCR NEGATIVE NEGATIVE Final    Comment: (NOTE) The Xpert Xpress SARS-CoV-2/FLU/RSV plus assay is intended as an aid in the diagnosis of influenza from Nasopharyngeal swab specimens and should not be used as a sole basis for treatment. Nasal washings and aspirates are unacceptable for Xpert Xpress  SARS-CoV-2/FLU/RSV testing.  Fact Sheet for Patients: BloggerCourse.com  Fact Sheet for Healthcare Providers: SeriousBroker.it  This test is not yet approved or cleared by the [place] FDA and has been authorized for detection  and/or diagnosis of SARS-CoV-2 by FDA under an Emergency Use Authorization (EUA). This EUA will remain in effect (meaning this test can be used) for the duration of the COVID-19 declaration under Section 564(b)(1) of the Act, 21 U.S.C. section 360bbb-3(b)(1), unless the authorization is terminated or revoked.  Performed at [facility], [address], [place], [place] [address]   Urine culture     Status: None   Collection Time: 01/28/21  6:01 AM   Specimen: Urine, Random  Result Value Ref Range Status   Specimen Description   Final    URINE, RANDOM Performed at [facility], [address], [place], [place] [address]    Special Requests   Final    NONE Performed at [facility], [address], [place], [place] [address]    Culture   Final    NO GROWTH Performed at [facility], [address] [place], [place] [address]    Report Status 01/29/2021 FINAL  Final  Aerobic/Anaerobic Culture (surgical/deep wound)     Status: None (Preliminary result)   Collection Time: 01/29/21 12:42 PM   Specimen: Abscess  Result Value Ref Range Status   Specimen Description   Final    ABSCESS Performed at [facility], [address] [place], [place] [address]    Special Requests   Final    Normal Performed at [facility], [address] [place], [place] [address]    Gram Stain   Final    ABUNDANT WBC PRESENT, PREDOMINANTLY PMN ABUNDANT GRAM NEGATIVE RODS ABUNDANT GRAM POSITIVE COCCI IN PAIRS IN CHAINS ABUNDANT GRAM POSITIVE RODS    Culture   Final    ABUNDANT ESCHERICHIA COLI ABUNDANT ENTEROCOCCUS AVIUM HOLDING FOR POSSIBLE  ANAEROBE ABUNDANT BACTEROIDES SPECIES BETA LACTAMASE POSITIVE Performed at [facility], [address] [place], [place] [address]    Report Status PENDING  Incomplete   Organism ID, Bacteria ESCHERICHIA COLI  Final   Organism ID, Bacteria ENTEROCOCCUS AVIUM  Final      Susceptibility   Enterococcus avium - MIC*    AMPICILLIN <=2 SENSITIVE Sensitive     VANCOMYCIN <=0.5 SENSITIVE Sensitive     GENTAMICIN SYNERGY SENSITIVE Sensitive     * ABUNDANT ENTEROCOCCUS AVIUM   Escherichia coli - MIC*    AMPICILLIN >=32 RESISTANT Resistant     CEFAZOLIN 8 SENSITIVE Sensitive     CEFEPIME <=0.12 SENSITIVE Sensitive     CEFTAZIDIME <=1 SENSITIVE Sensitive     CEFTRIAXONE <=0.25 SENSITIVE Sensitive     CIPROFLOXACIN <=0.25 SENSITIVE Sensitive     GENTAMICIN <=1 SENSITIVE Sensitive     IMIPENEM <=0.25 SENSITIVE Sensitive     TRIMETH/SULFA <=20 SENSITIVE Sensitive     AMPICILLIN/SULBACTAM >=32 RESISTANT Resistant     PIP/TAZO <=4 SENSITIVE Sensitive     * ABUNDANT ESCHERICHIA COLI     Labs: BNP (last 3 results) No results for input(s): BNP in the last 8760 hours. Basic Metabolic Panel: Recent Labs  Lab 01/28/21 0431 01/29/21 0439 01/30/21 0409 01/31/21 0419 02/01/21 0441 02/02/21 0606  NA 134* 132* 135 137 138 137  K 3.0* 3.7 3.5 3.6 3.8 4.0  CL 101 103 103 106 104 102  CO2 21* GLUCOSE 135* 116* 113* 107* 128* 114*  BUN 9 7* 6* 7* 10 10  CREATININE 0.80 0.74 0.63 0.64 0.71 0.82  CALCIUM 8.2* 8.2* 8.1* 8.1* 8.5* 8.6*  MG 1.5* 2.3 2.1  --   --   --  Liver Function Tests: Recent Labs  Lab 01/27/21 2148 01/28/21 0431  AST 20 17  ALT 6 10  ALKPHOS 42 38  BILITOT 1.0 1.1  PROT 7.6 6.5  ALBUMIN 3.4* 3.0*   No results for input(s): LIPASE, AMYLASE in the last 168 hours. No results for input(s): AMMONIA in the last 168 hours. CBC: Recent Labs  Lab 01/27/21 2148 01/28/21 0431 01/29/21 0439 01/30/21 0409 01/31/21 0419 02/01/21 0441 02/02/21 0606   WBC 21.2*   < > 27.0* 21.0* 12.1* 11.8* 11.6*  NEUTROABS 17.6*  --   --   --   --   --   --   HGB 13.4   < > 11.6* 11.7* 11.2* 11.5* 11.8*  HCT 38.7   < > 34.1* 34.2* 33.5* 34.5* 36.0  MCV 90.4   < > 90.9 91.2 93.6 93.2 93.8  PLT 275   < > 235 284 274 321 369   < > = values in this interval not displayed.   Cardiac Enzymes: No results for input(s): CKTOTAL, CKMB, CKMBINDEX, TROPONINI in the last 168 hours. BNP: Invalid input(s): POCBNP CBG: Recent Labs  Lab 02/01/21 1221 02/01/21 1527 02/01/21 2038 02/02/21 0731 02/02/21 1142  GLUCAP 206* 182* 191* 98 159*   D-Dimer No results for input(s): DDIMER in the last 72 hours. Hgb A1c No results for input(s): HGBA1C in the last 72 hours. Lipid Profile No results for input(s): CHOL, HDL, LDLCALC, TRIG, CHOLHDL, LDLDIRECT in the last 72 hours. Thyroid function studies No results for input(s): TSH, T4TOTAL, T3FREE, THYROIDAB in the last 72 hours.  Invalid input(s): FREET3 Anemia work up No results for input(s): VITAMINB12, FOLATE, FERRITIN, TIBC, IRON, RETICCTPCT in the last 72 hours. Urinalysis    Component Value Date/Time   COLORURINE YELLOW (A) 01/28/2021 0601   APPEARANCEUR HAZY (A) 01/28/2021 0601   APPEARANCEUR Clear 10/27/2019 1507   LABSPEC 1.009 01/28/2021 0601   PHURINE 6.0 01/28/2021 0601   GLUCOSEU NEGATIVE 01/28/2021 0601   HGBUR NEGATIVE 01/28/2021 0601   BILIRUBINUR NEGATIVE 01/28/2021 0601   BILIRUBINUR Negative 10/27/2019 1507   KETONESUR 5 (A) 01/28/2021 0601   PROTEINUR NEGATIVE 01/28/2021 0601   NITRITE NEGATIVE 01/28/2021 0601   LEUKOCYTESUR NEGATIVE 01/28/2021 0601   Sepsis Labs Invalid input(s): PROCALCITONIN,  WBC,  LACTICIDVEN Microbiology Recent Results (from the past 240 hour(s))  Blood Culture (routine x 2)     Status: None   Collection Time: 01/27/21  9:48 PM   Specimen: BLOOD  Result Value Ref Range Status   Specimen Description BLOOD RIGHT AC  Final   Special Requests   Final     BOTTLES DRAWN AEROBIC AND ANAEROBIC Blood Culture results may not be optimal due to an excessive volume of blood received in culture bottles   Culture   Final    NO GROWTH 5 DAYS Performed at [facility], [address], [place], [place] [address]    Report Status 02/01/2021 FINAL  Final  Blood Culture (routine x 2)     Status: Abnormal   Collection Time: 01/27/21 10:33 PM   Specimen: BLOOD  Result Value Ref Range Status   Specimen Description   Final    BLOOD LEFT HAND Performed at [facility], [address], [place], [place] [address]    Special Requests   Final    BOTTLES DRAWN AEROBIC AND ANAEROBIC Blood Culture results may not be optimal due to an inadequate volume of blood received in culture bottles Performed at [facility],  [address], [place], [place] [address]    Culture  Setup Time   Final    GRAM POSITIVE COCCI ANAEROBIC BOTTLE ONLY CRITICAL RESULT CALLED TO, READ BACK BY AND VERIFIED WITH: WALLY RANAZIRI 01/28/21 1736 KLW    Culture (A)  Final    STAPHYLOCOCCUS EPIDERMIDIS THE SIGNIFICANCE OF ISOLATING THIS ORGANISM FROM A SINGLE SET OF BLOOD CULTURES WHEN MULTIPLE SETS ARE DRAWN IS UNCERTAIN. PLEASE NOTIFY THE MICROBIOLOGY DEPARTMENT WITHIN ONE WEEK IF SPECIATION AND SENSITIVITIES ARE REQUIRED. Performed at [facility], [address] [place], [place] [address]    Report Status 01/30/2021 FINAL  Final  Blood Culture ID Panel (Reflexed)     Status: Abnormal   Collection Time: 01/27/21 10:33 PM  Result Value Ref Range Status   Enterococcus faecalis NOT DETECTED NOT DETECTED Final   Enterococcus Faecium NOT DETECTED NOT DETECTED Final   Listeria monocytogenes NOT DETECTED NOT DETECTED Final   Staphylococcus species DETECTED (A) NOT DETECTED Final    Comment: CRITICAL RESULT CALLED TO, READ BACK BY AND VERIFIED WITH: WALLY RANAZIRI 01/28/21 1736 KLW    Staphylococcus aureus (BCID) NOT DETECTED NOT DETECTED Final    Staphylococcus epidermidis DETECTED (A) NOT DETECTED Final    Comment: CRITICAL RESULT CALLED TO, READ BACK BY AND VERIFIED WITH: WALLY RANAZIRI 01/28/21 1736 KLW    Staphylococcus lugdunensis NOT DETECTED NOT DETECTED Final   Streptococcus species NOT DETECTED NOT DETECTED Final   Streptococcus agalactiae NOT DETECTED NOT DETECTED Final   Streptococcus pneumoniae NOT DETECTED NOT DETECTED Final   Streptococcus pyogenes NOT DETECTED NOT DETECTED Final   A.calcoaceticus-baumannii NOT DETECTED NOT DETECTED Final   Bacteroides fragilis NOT DETECTED NOT DETECTED Final   Enterobacterales NOT DETECTED NOT DETECTED Final   Enterobacter cloacae complex NOT DETECTED NOT DETECTED Final   Escherichia coli NOT DETECTED NOT DETECTED Final   Klebsiella aerogenes NOT DETECTED NOT DETECTED Final   Klebsiella oxytoca NOT DETECTED NOT DETECTED Final   Klebsiella pneumoniae NOT DETECTED NOT DETECTED Final   Proteus species NOT DETECTED NOT DETECTED Final   Salmonella species NOT DETECTED NOT DETECTED Final   Serratia marcescens NOT DETECTED NOT DETECTED Final   Haemophilus influenzae NOT DETECTED NOT DETECTED Final   Neisseria meningitidis NOT DETECTED NOT DETECTED Final   Pseudomonas aeruginosa NOT DETECTED NOT DETECTED Final   Stenotrophomonas maltophilia NOT DETECTED NOT DETECTED Final   Candida albicans NOT DETECTED NOT DETECTED Final   Candida auris NOT DETECTED NOT DETECTED Final   Candida glabrata NOT DETECTED NOT DETECTED Final   Candida krusei NOT DETECTED NOT DETECTED Final   Candida parapsilosis NOT DETECTED NOT DETECTED Final   Candida tropicalis NOT DETECTED NOT DETECTED Final   Cryptococcus neoformans/gattii NOT DETECTED NOT DETECTED Final   Methicillin resistance mecA/C NOT DETECTED NOT DETECTED Final    Comment: Performed at [facility], [address] [place], [place] [address]  Resp Panel by RT-PCR (Flu A&B, Covid) Nasopharyngeal Swab     Status: None   Collection Time:  01/27/21 10:34 PM   Specimen: Nasopharyngeal Swab; Nasopharyngeal(NP) swabs in vial transport medium  Result Value Ref Range Status   SARS Coronavirus 2 by RT PCR NEGATIVE NEGATIVE Final    Comment: (NOTE) SARS-CoV-2 target nucleic acids are NOT DETECTED.  The SARS-CoV-2 RNA is generally detectable in upper respiratory specimens during the acute phase of infection. The lowest concentration of SARS-CoV-2 viral copies this assay can detect is 138 copies/mL. A negative result does not preclude SARS-Cov-2  infection and should not be used as the sole basis for treatment or other patient management decisions. A negative result may occur with  improper specimen collection/handling, submission of specimen other than nasopharyngeal swab, presence of viral mutation(s) within the areas targeted by this assay, and inadequate number of viral copies(<138 copies/mL). A negative result must be combined with clinical observations, patient history, and epidemiological information. The expected result is Negative.  Fact Sheet for Patients:  BloggerCourse.com  Fact Sheet for Healthcare Providers:  SeriousBroker.it  This test is no t yet approved or cleared by the [place] FDA and  has been authorized for detection and/or diagnosis of SARS-CoV-2 by FDA under an Emergency Use Authorization (EUA). This EUA will remain  in effect (meaning this test can be used) for the duration of the COVID-19 declaration under Section 564(b)(1) of the Act, 21 U.S.C.section 360bbb-3(b)(1), unless the authorization is terminated  or revoked sooner.       Influenza A by PCR NEGATIVE NEGATIVE Final   Influenza B by PCR NEGATIVE NEGATIVE Final    Comment: (NOTE) The Xpert Xpress SARS-CoV-2/FLU/RSV plus assay is intended as an aid in the diagnosis of influenza from Nasopharyngeal swab specimens and should not be used as a sole basis for treatment. Nasal washings  and aspirates are unacceptable for Xpert Xpress SARS-CoV-2/FLU/RSV testing.  Fact Sheet for Patients: BloggerCourse.com  Fact Sheet for Healthcare Providers: SeriousBroker.it  This test is not yet approved or cleared by the [place] FDA and has been authorized for detection and/or diagnosis of SARS-CoV-2 by FDA under an Emergency Use Authorization (EUA). This EUA will remain in effect (meaning this test can be used) for the duration of the COVID-19 declaration under Section 564(b)(1) of the Act, 21 U.S.C. section 360bbb-3(b)(1), unless the authorization is terminated or revoked.  Performed at [facility], [address], [place], [place] [address]   Urine culture     Status: None   Collection Time: 01/28/21  6:01 AM   Specimen: Urine, Random  Result Value Ref Range Status   Specimen Description   Final    URINE, RANDOM Performed at [facility], [address] [place], [place] [address]    Special Requests   Final    NONE Performed at [facility], [address] [place], [place] [address]    Culture   Final    NO GROWTH Performed at [facility], [address] [place], [place] [address]    Report Status 01/29/2021 FINAL  Final  Aerobic/Anaerobic Culture (surgical/deep wound)     Status: None (Preliminary result)   Collection Time: 01/29/21 12:42 PM   Specimen: Abscess  Result Value Ref Range Status   Specimen Description   Final    ABSCESS Performed at [facility], [address], [place], [place] [address]    Special Requests   Final    Normal Performed at [facility], [address] [place], [place] [address]    Gram Stain   Final    ABUNDANT WBC PRESENT, PREDOMINANTLY PMN ABUNDANT GRAM NEGATIVE RODS ABUNDANT GRAM POSITIVE COCCI IN PAIRS IN CHAINS ABUNDANT GRAM POSITIVE RODS    Culture   Final    ABUNDANT ESCHERICHIA COLI ABUNDANT  ENTEROCOCCUS AVIUM HOLDING FOR POSSIBLE ANAEROBE ABUNDANT BACTEROIDES SPECIES BETA LACTAMASE POSITIVE Performed at [facility], [address] [place], [place] [address]    Report Status PENDING  Incomplete   Organism ID, Bacteria ESCHERICHIA COLI  Final  Organism ID, Bacteria ENTEROCOCCUS AVIUM  Final      Susceptibility   Enterococcus avium - MIC*    AMPICILLIN <=2 SENSITIVE Sensitive     VANCOMYCIN <=0.5 SENSITIVE Sensitive     GENTAMICIN SYNERGY SENSITIVE Sensitive     * ABUNDANT ENTEROCOCCUS AVIUM   Escherichia coli - MIC*    AMPICILLIN >=32 RESISTANT Resistant     CEFAZOLIN 8 SENSITIVE Sensitive     CEFEPIME <=0.12 SENSITIVE Sensitive     CEFTAZIDIME <=1 SENSITIVE Sensitive     CEFTRIAXONE <=0.25 SENSITIVE Sensitive     CIPROFLOXACIN <=0.25 SENSITIVE Sensitive     GENTAMICIN <=1 SENSITIVE Sensitive     IMIPENEM <=0.25 SENSITIVE Sensitive     TRIMETH/SULFA <=20 SENSITIVE Sensitive     AMPICILLIN/SULBACTAM >=32 RESISTANT Resistant     PIP/TAZO <=4 SENSITIVE Sensitive     * ABUNDANT ESCHERICHIA COLI     Time coordinating discharge: 40 minutes  SIGNED:   Alba Cory, MD  [facility]

## 2021-02-02 NOTE — Progress Notes (Signed)
Cynthia Jones to be D/C'd  per MD order. Discussed with the patient and all questions fully answered.  VSS, Skin clean, dry and intact without evidence of skin break down, no evidence of skin tears noted.  IV catheter discontinued intact. Site without signs and symptoms of complications. Dressing and pressure applied. JP drain flushed and emptied.  An After Visit Summary was printed and given to the patient. Patient received prescription.  D/c education completed with patient/family including follow up instructions, medication list, d/c activities limitations if indicated, with other d/c instructions as indicated by MD - patient able to verbalize understanding, all questions fully answered.   Patient instructed to return to ED, call 911, or call MD for any changes in condition.   Patient to be escorted via WC, and D/C home via private auto.

## 2021-02-03 ENCOUNTER — Other Ambulatory Visit: Payer: Self-pay | Admitting: General Surgery

## 2021-02-03 ENCOUNTER — Other Ambulatory Visit: Payer: Self-pay | Admitting: Student

## 2021-02-03 DIAGNOSIS — K651 Peritoneal abscess: Secondary | ICD-10-CM

## 2021-02-03 LAB — AEROBIC/ANAEROBIC CULTURE W GRAM STAIN (SURGICAL/DEEP WOUND): Special Requests: NORMAL

## 2021-02-04 DIAGNOSIS — A419 Sepsis, unspecified organism: Secondary | ICD-10-CM | POA: Diagnosis not present

## 2021-02-04 DIAGNOSIS — G20 Parkinson's disease: Secondary | ICD-10-CM | POA: Diagnosis not present

## 2021-02-04 DIAGNOSIS — Z4801 Encounter for change or removal of surgical wound dressing: Secondary | ICD-10-CM | POA: Diagnosis not present

## 2021-02-04 DIAGNOSIS — E1140 Type 2 diabetes mellitus with diabetic neuropathy, unspecified: Secondary | ICD-10-CM | POA: Diagnosis not present

## 2021-02-04 DIAGNOSIS — B9620 Unspecified Escherichia coli [E. coli] as the cause of diseases classified elsewhere: Secondary | ICD-10-CM | POA: Diagnosis not present

## 2021-02-04 DIAGNOSIS — Z48815 Encounter for surgical aftercare following surgery on the digestive system: Secondary | ICD-10-CM | POA: Diagnosis not present

## 2021-02-04 DIAGNOSIS — I10 Essential (primary) hypertension: Secondary | ICD-10-CM | POA: Diagnosis not present

## 2021-02-04 DIAGNOSIS — E1159 Type 2 diabetes mellitus with other circulatory complications: Secondary | ICD-10-CM | POA: Diagnosis not present

## 2021-02-04 DIAGNOSIS — K5720 Diverticulitis of large intestine with perforation and abscess without bleeding: Secondary | ICD-10-CM | POA: Diagnosis not present

## 2021-02-05 ENCOUNTER — Other Ambulatory Visit: Payer: Self-pay | Admitting: Internal Medicine

## 2021-02-05 DIAGNOSIS — Z4801 Encounter for change or removal of surgical wound dressing: Secondary | ICD-10-CM | POA: Diagnosis not present

## 2021-02-05 DIAGNOSIS — Z48815 Encounter for surgical aftercare following surgery on the digestive system: Secondary | ICD-10-CM | POA: Diagnosis not present

## 2021-02-05 DIAGNOSIS — E1140 Type 2 diabetes mellitus with diabetic neuropathy, unspecified: Secondary | ICD-10-CM | POA: Diagnosis not present

## 2021-02-05 DIAGNOSIS — I10 Essential (primary) hypertension: Secondary | ICD-10-CM | POA: Diagnosis not present

## 2021-02-05 DIAGNOSIS — B9620 Unspecified Escherichia coli [E. coli] as the cause of diseases classified elsewhere: Secondary | ICD-10-CM | POA: Diagnosis not present

## 2021-02-05 DIAGNOSIS — K5720 Diverticulitis of large intestine with perforation and abscess without bleeding: Secondary | ICD-10-CM | POA: Diagnosis not present

## 2021-02-05 DIAGNOSIS — E1159 Type 2 diabetes mellitus with other circulatory complications: Secondary | ICD-10-CM | POA: Diagnosis not present

## 2021-02-05 DIAGNOSIS — G20 Parkinson's disease: Secondary | ICD-10-CM | POA: Diagnosis not present

## 2021-02-05 DIAGNOSIS — A419 Sepsis, unspecified organism: Secondary | ICD-10-CM | POA: Diagnosis not present

## 2021-02-05 NOTE — Telephone Encounter (Signed)
Scheduled 8/5

## 2021-02-06 ENCOUNTER — Ambulatory Visit (INDEPENDENT_AMBULATORY_CARE_PROVIDER_SITE_OTHER): Payer: Medicare HMO | Admitting: Licensed Clinical Social Worker

## 2021-02-06 ENCOUNTER — Emergency Department
Admission: EM | Admit: 2021-02-06 | Discharge: 2021-02-06 | Disposition: A | Discharge status: Home / Self Care | Payer: Medicare HMO | Attending: Emergency Medicine | Admitting: Emergency Medicine

## 2021-02-06 ENCOUNTER — Emergency Department: Payer: Medicare HMO

## 2021-02-06 ENCOUNTER — Other Ambulatory Visit: Payer: Self-pay

## 2021-02-06 DIAGNOSIS — L02211 Cutaneous abscess of abdominal wall: Secondary | ICD-10-CM | POA: Diagnosis not present

## 2021-02-06 DIAGNOSIS — I152 Hypertension secondary to endocrine disorders: Secondary | ICD-10-CM

## 2021-02-06 DIAGNOSIS — E039 Hypothyroidism, unspecified: Secondary | ICD-10-CM | POA: Insufficient documentation

## 2021-02-06 DIAGNOSIS — Z20822 Contact with and (suspected) exposure to covid-19: Secondary | ICD-10-CM | POA: Insufficient documentation

## 2021-02-06 DIAGNOSIS — Z872 Personal history of diseases of the skin and subcutaneous tissue: Secondary | ICD-10-CM | POA: Diagnosis not present

## 2021-02-06 DIAGNOSIS — E1140 Type 2 diabetes mellitus with diabetic neuropathy, unspecified: Secondary | ICD-10-CM | POA: Diagnosis not present

## 2021-02-06 DIAGNOSIS — R0902 Hypoxemia: Secondary | ICD-10-CM | POA: Diagnosis not present

## 2021-02-06 DIAGNOSIS — I10 Essential (primary) hypertension: Secondary | ICD-10-CM | POA: Diagnosis not present

## 2021-02-06 DIAGNOSIS — R5381 Other malaise: Secondary | ICD-10-CM | POA: Diagnosis not present

## 2021-02-06 DIAGNOSIS — E860 Dehydration: Secondary | ICD-10-CM | POA: Insufficient documentation

## 2021-02-06 DIAGNOSIS — Z96651 Presence of right artificial knee joint: Secondary | ICD-10-CM | POA: Insufficient documentation

## 2021-02-06 DIAGNOSIS — Z79899 Other long term (current) drug therapy: Secondary | ICD-10-CM | POA: Diagnosis not present

## 2021-02-06 DIAGNOSIS — R531 Weakness: Secondary | ICD-10-CM

## 2021-02-06 DIAGNOSIS — F3342 Major depressive disorder, recurrent, in full remission: Secondary | ICD-10-CM

## 2021-02-06 DIAGNOSIS — K651 Peritoneal abscess: Secondary | ICD-10-CM | POA: Diagnosis not present

## 2021-02-06 DIAGNOSIS — Z7984 Long term (current) use of oral hypoglycemic drugs: Secondary | ICD-10-CM | POA: Insufficient documentation

## 2021-02-06 DIAGNOSIS — E1159 Type 2 diabetes mellitus with other circulatory complications: Secondary | ICD-10-CM

## 2021-02-06 DIAGNOSIS — G20 Parkinson's disease: Secondary | ICD-10-CM | POA: Insufficient documentation

## 2021-02-06 DIAGNOSIS — K429 Umbilical hernia without obstruction or gangrene: Secondary | ICD-10-CM | POA: Diagnosis not present

## 2021-02-06 DIAGNOSIS — N730 Acute parametritis and pelvic cellulitis: Secondary | ICD-10-CM | POA: Diagnosis not present

## 2021-02-06 LAB — COMPREHENSIVE METABOLIC PANEL
ALT: 8 U/L (ref 0–44)
AST: 24 U/L (ref 15–41)
Albumin: 3.2 g/dL — ABNORMAL LOW (ref 3.5–5.0)
Alkaline Phosphatase: 47 U/L (ref 38–126)
Anion gap: 11 (ref 5–15)
BUN: 11 mg/dL (ref 8–23)
CO2: 22 mmol/L (ref 22–32)
Calcium: 9.2 mg/dL (ref 8.9–10.3)
Chloride: 102 mmol/L (ref 98–111)
Creatinine, Ser: 0.98 mg/dL (ref 0.44–1.00)
GFR, Estimated: 59 mL/min — ABNORMAL LOW (ref >60)
Glucose, Bld: 128 mg/dL — ABNORMAL HIGH (ref 70–99)
Potassium: 5.3 mmol/L — ABNORMAL HIGH (ref 3.5–5.1)
Sodium: 135 mmol/L (ref 135–145)
Total Bilirubin: 0.8 mg/dL (ref 0.3–1.2)
Total Protein: 7.9 g/dL (ref 6.5–8.1)

## 2021-02-06 LAB — CBC WITH DIFFERENTIAL/PLATELET
Abs Immature Granulocytes: 0.19 10*3/uL — ABNORMAL HIGH (ref 0.00–0.07)
Basophils Absolute: 0.1 10*3/uL (ref 0.0–0.1)
Basophils Relative: 1 %
Eosinophils Absolute: 0.4 10*3/uL (ref 0.0–0.5)
Eosinophils Relative: 3 %
HCT: 38.8 % (ref 36.0–46.0)
Hemoglobin: 12.8 g/dL (ref 12.0–15.0)
Immature Granulocytes: 2 %
Lymphocytes Relative: 20 %
Lymphs Abs: 2.5 10*3/uL (ref 0.7–4.0)
MCH: 30.5 pg (ref 26.0–34.0)
MCHC: 33 g/dL (ref 30.0–36.0)
MCV: 92.4 fL (ref 80.0–100.0)
Monocytes Absolute: 1 10*3/uL (ref 0.1–1.0)
Monocytes Relative: 8 %
Neutro Abs: 8.2 10*3/uL — ABNORMAL HIGH (ref 1.7–7.7)
Neutrophils Relative %: 66 %
Platelets: 394 10*3/uL (ref 150–400)
RBC: 4.2 MIL/uL (ref 3.87–5.11)
RDW: 14.9 % (ref 11.5–15.5)
WBC: 12.3 10*3/uL — ABNORMAL HIGH (ref 4.0–10.5)
nRBC: 0 % (ref 0.0–0.2)

## 2021-02-06 LAB — URINALYSIS, COMPLETE (UACMP) WITH MICROSCOPIC
Bacteria, UA: NONE SEEN
Bilirubin Urine: NEGATIVE
Glucose, UA: NEGATIVE mg/dL
Hgb urine dipstick: NEGATIVE
Ketones, ur: NEGATIVE mg/dL
Leukocytes,Ua: NEGATIVE
Nitrite: NEGATIVE
Protein, ur: NEGATIVE mg/dL
Specific Gravity, Urine: 1.008 (ref 1.005–1.030)
pH: 7 (ref 5.0–8.0)

## 2021-02-06 LAB — RESP PANEL BY RT-PCR (FLU A&B, COVID) ARPGX2
Influenza A by PCR: NEGATIVE
Influenza B by PCR: NEGATIVE
SARS Coronavirus 2 by RT PCR: NEGATIVE

## 2021-02-06 LAB — LACTIC ACID, PLASMA: Lactic Acid, Venous: 1.6 mmol/L (ref 0.5–1.9)

## 2021-02-06 LAB — TROPONIN I (HIGH SENSITIVITY): Troponin I (High Sensitivity): 6 ng/L (ref <18)

## 2021-02-06 MED ORDER — LACTATED RINGERS IV BOLUS
1000.0000 mL | Freq: Once | INTRAVENOUS | Status: AC
  Administered 2021-02-06: 1000 mL via INTRAVENOUS

## 2021-02-06 NOTE — ED Provider Notes (Signed)
[facility] Provider Note  ____________________________________________   Event Date/Time   First MD Initiated Contact with Patient 02/06/21 [phone]     (approximate)  I have reviewed the triage vital signs and the nursing notes.   HISTORY  Chief Complaint Weakness   HPI Cynthia Jones is a 80 y.o. female with past medical history significant for Parkinson's disease, chronically abducted right eye, diabetes type 2, hypertension, hypothyroidism and recent admission 5/30-6/5 for sepsis secondary to diverticulitis associated with an abscess discharged with drain in place on Augmentin and Bactrim who presents for assessment of some generalized weakness.  Patient states she is receiving care daily by nursing at home was worried she might need a little fluids.  Patient states she has not had any fevers, headache, earache, sore throat, vomiting, diarrhea, dysuria, abdominal pain, back pain, chest pain, cough, shortness of breath or any other symptoms other than just feeling very weak and tired.  He states she sat down hard the day after she got from the hospital but has no back pain and not otherwise have any recent falls or injuries or head trauma.  No other acute concerns at this time.  She has been taking all of her medicines as prescribed.         Past Medical History:  Diagnosis Date   Acute anxiety 03/06/2015   Allergy    Anxiety    Degenerative joint disease of knee    Depression    Fibromyalgia    Obesity    Osteopenia    RAD (reactive airway disease)     Patient Active Problem List   Diagnosis Date Noted   Sepsis (HCC) 01/27/2021   Hyperlipidemia associated with type 2 diabetes mellitus (HCC) 01/27/2021   Parkinson disease (HCC) 09/25/2020   DDD (degenerative disc disease), lumbosacral 07/10/2020   Chronic lower extremity pain (Bilateral) 07/10/2020   Other intervertebral disc degeneration, lumbar region 07/10/2020   Change  in voice 07/02/2020   Difficulty walking 07/02/2020   Tremor 07/02/2020   Thyroiditis 12/19/2019   Chronic knee pain (Left) 07/05/2019   Neurogenic pain 11/01/2018   Morbid obesity with BMI of 40.0-44.9, adult (HCC) 09/20/2018   Hypothyroid 09/15/2018   Vitamin D deficiency 09/05/2018   Elevated C-reactive protein (CRP) 08/09/2018   Elevated sed rate 08/09/2018   Pharmacologic therapy 08/08/2018   Disorder of skeletal system 08/08/2018   Problems influencing health status 08/08/2018   Contusion of foot 03/09/2018   Advanced care planning/counseling discussion 04/12/2017   Lumbar spondylosis 10/07/2016   Hypercholesteremia 09/14/2016   Long term prescription opiate use 09/08/2016   Long term prescription benzodiazepine use 09/08/2016   Chronic pain syndrome 09/08/2016   Chronic low back pain (2ry area of Pain) (Bilateral) (L>R) 09/08/2016   Chronic knee pain (1ry area of Pain) (Bilateral) (L>R) 09/08/2016   Cervicogenic headache 09/08/2016   Chronic hip pain (Bilateral) 09/08/2016   Chronic hand pain (Bilateral) (R>L) 09/08/2016   Chronic foot pain (Left) 09/08/2016   Insomnia 09/17/2015   Folliculitis 08/05/2015   History of total knee replacement (Right) 04/17/2015   Fibromyalgia 04/09/2015   Bilateral occipital neuralgia 04/09/2015   Greater trochanteric bursitis 04/09/2015   Osteoarthritis of knee (Left) 04/09/2015   Sacroiliac joint disease 04/09/2015   Lumbar facet syndrome (Bilateral) (L>R) 04/09/2015   Neuropathy due to secondary diabetes (HCC) 04/09/2015   Osteopenia 03/06/2015   Anxiety 03/06/2015   Depression 03/06/2015   Hypertension associated with diabetes (HCC) 03/06/2015   Diabetes  mellitus (HCC) 03/06/2015   Osteoarthritis 03/06/2015   Seasonal allergic rhinitis 03/06/2015   Obesity 03/06/2015   Intertrigo 03/06/2015    Past Surgical History:  Procedure Laterality Date   ABDOMINAL HYSTERECTOMY     APPENDECTOMY     BREAST CYST ASPIRATION Right  04/18/2013   neg   CHOLECYSTECTOMY     EYE SURGERY     JOINT REPLACEMENT  2014   right knee   PAROTIDECTOMY Left    TONSILLECTOMY      Prior to Admission medications   Medication Sig Start Date End Date Taking? Authorizing Provider  amoxicillin-clavulanate (AUGMENTIN) 875-125 MG tablet Take 1 tablet by mouth 2 (two) times daily for 10 days. 02/02/21 02/12/21  Regalado, Jon Billings A, MD  b complex vitamins tablet Take 1 tablet by mouth daily.    [provider]  carbidopa-levodopa (SINEMET IR) 25-100 MG tablet Take 1 tablet by mouth 4 (four) times daily. 07/04/20   [provider]  diclofenac Sodium (VOLTAREN) 1 % GEL Apply 2 g topically as needed.  01/14/20   [provider]  DULoxetine (CYMBALTA) 60 MG capsule Take 60 mg by mouth daily. Patient not taking: Reported on 01/29/2021 12/25/20   [provider]  guaiFENesin (MUCINEX) 600 MG 12 hr tablet Take 1 tablet (600 mg total) by mouth 2 (two) times daily. 02/02/21   Regalado, Belkys A, MD  Ipratropium-Albuterol (COMBIVENT) 20-100 MCG/ACT AERS respimat Inhale 1 puff into the lungs every 6 (six) hours. 02/02/21   Regalado, Belkys A, MD  levothyroxine (SYNTHROID) 50 MCG tablet TAKE 1 TABLET BY MOUTH EVERY DAY 09/13/20   Cannady, Jolene T, NP  lidocaine (LIDODERM) 5 % Place 1 patch onto the skin daily. Remove & Discard patch within 12 hours or as directed by MD 10/31/20   Loura Pardon, MD  losartan (COZAAR) 100 MG tablet TAKE 1 TABLET BY MOUTH DAILY 11/23/20   Vigg, Avanti, MD  metFORMIN (GLUCOPHAGE) 500 MG tablet Take 1 tablet (500 mg total) by mouth daily with breakfast. 01/02/21   Vigg, Avanti, MD  mirtazapine (REMERON) 7.5 MG tablet TAKE 1 TABLET BY MOUTH AT BEDTIME. 01/26/21   Vigg, Avanti, MD  nystatin (MYCOSTATIN/NYSTOP) powder APPLY TO AFFECTED AREA 3 TIMES A DAY 12/24/20   Vigg, Avanti, MD  pregabalin (LYRICA) 25 MG capsule Take 1 capsule (25 mg total) by mouth daily. 11/28/20   Vigg, Avanti, MD   sulfamethoxazole-trimethoprim (BACTRIM DS) 800-160 MG tablet Take 1 tablet by mouth 2 (two) times daily for 10 days. 02/02/21 02/12/21  Regalado, Jon Billings A, MD  Vitamin D, Ergocalciferol, (DRISDOL) 1.25 MG (50000 UNIT) CAPS capsule TAKE 1 CAPSULE (50,000 UNITS TOTAL) BY MOUTH EVERY 7 (SEVEN) DAYS 01/14/21   Loura Pardon, MD    Allergies Nsaids and Erythromycin  Family History  Problem Relation Age of Onset   Heart disease Mother    Stroke Mother    Diabetes Mother    Alzheimer's disease Father    Parkinson's disease Father    Cancer Sister     Social History Social History   Tobacco Use   Smoking status: Never   Smokeless tobacco: Never  Vaping Use   Vaping Use: Never used  Substance Use Topics   Alcohol use: No    Alcohol/week: 0.0 standard drinks   Drug use: No    Review of Systems  Review of Systems  Constitutional:  Negative for chills and fever.  HENT:  Negative for sore throat.   Eyes:  Negative for pain.  Respiratory:  Negative for cough and stridor.   Cardiovascular:  Negative for chest pain.  Gastrointestinal:  Negative for vomiting.  Skin:  Negative for rash.  Neurological:  Positive for weakness. Negative for seizures, loss of consciousness and headaches.  Psychiatric/Behavioral:  Negative for suicidal ideas.   All other systems reviewed and are negative.    ____________________________________________   PHYSICAL EXAM:  VITAL SIGNS: ED Triage Vitals  Enc Vitals Group     BP      Pulse      Resp      Temp      Temp src      SpO2      Weight      Height      Head Circumference      Peak Flow      Pain Score      Pain Loc      Pain Edu?      Excl. in GC?    Vitals:   02/06/21 1000 02/06/21 1140  BP: (!) 153/55 (!) 147/50  Pulse: 83 84  Resp:  18  Temp: 98.6 F (37 C)   SpO2: 94% 95%   Physical Exam Vitals and nursing note reviewed.  Constitutional:      General: She is not in acute distress.    Appearance: She is well-developed.  She is obese.  HENT:     Head: Normocephalic and atraumatic.     Right Ear: External ear normal.     Left Ear: External ear normal.     Nose: Nose normal.     Mouth/Throat:     Mouth: Mucous membranes are dry.  Eyes:     Conjunctiva/sclera: Conjunctivae normal.  Cardiovascular:     Rate and Rhythm: Normal rate and regular rhythm.     Heart sounds: No murmur heard. Pulmonary:     Effort: Pulmonary effort is normal. No respiratory distress.     Breath sounds: Normal breath sounds.  Abdominal:     Palpations: Abdomen is soft.     Tenderness: There is no abdominal tenderness.  Musculoskeletal:     Cervical back: Neck supple.  Skin:    General: Skin is warm and dry.     Capillary Refill: Capillary refill takes 2 to 3 seconds.  Neurological:     Mental Status: She is alert.  Psychiatric:        Mood and Affect: Mood normal.    Right eye is abducted.  Patient otherwise has symmetric strength in upper and lower extremities.  Cranial nerves II to XII otherwise grossly intact. ____________________________________________   LABS (all labs ordered are listed, but only abnormal results are displayed)  Labs Reviewed  URINALYSIS, COMPLETE (UACMP) WITH MICROSCOPIC - Abnormal; Notable for the following components:      Result Value   Color, Urine STRAW (*)    APPearance CLEAR (*)    All other components within normal limits  CBC WITH DIFFERENTIAL/PLATELET - Abnormal; Notable for the following components:   WBC 12.3 (*)    Neutro Abs 8.2 (*)    Abs Immature Granulocytes 0.19 (*)    All other components within normal limits  COMPREHENSIVE METABOLIC PANEL - Abnormal; Notable for the following components:   Potassium 5.3 (*)    Glucose, Bld 128 (*)    Albumin 3.2 (*)    GFR, Estimated 59 (*)    All other components within normal limits  RESP PANEL BY RT-PCR (FLU A&B, COVID) ARPGX2  LACTIC ACID, PLASMA  TROPONIN I (HIGH SENSITIVITY)  TROPONIN I (HIGH SENSITIVITY)    ____________________________________________  EKG  Sinus rhythm with a ventricular of 78, normal axis, unremarkable intervals without evidence of ischemia or significant underlying arrhythmia. ____________________________________________  RADIOLOGY  ED MD interpretation: CT A/P Interval decrease in size of the right pelvic abscess with  drain in place.  There is hepatic steatosis.  Diverticulosis, kidney stone, perinephric stranding, evidence of pancreatitis, biliary duct dilation or other clear acute abdominopelvic process.  Official radiology report(s): CT ABDOMEN PELVIS WO CONTRAST  Result Date: 02/06/2021 CLINICAL DATA:  Abdominal abscess.  Status post drain placement. EXAM: CT ABDOMEN AND PELVIS WITHOUT CONTRAST TECHNIQUE: Multidetector CT imaging of the abdomen and pelvis was performed following the standard protocol without IV contrast. COMPARISON:  01/28/2021 FINDINGS: Lower chest: Unremarkable Hepatobiliary: The liver shows diffusely decreased attenuation suggesting fat deposition. No focal abnormality in the liver on this study without intravenous contrast. Gallbladder is surgically absent. No intrahepatic or extrahepatic biliary dilation. Pancreas: No focal mass lesion. No dilatation of the main duct. No intraparenchymal cyst. No peripancreatic edema. Spleen: No splenomegaly. No focal mass lesion. Adrenals/Urinary Tract: No adrenal nodule or mass. Unremarkable noncontrast appearance of the kidneys. No evidence for hydroureter. The urinary bladder appears normal for the degree of distention. Stomach/Bowel: Stomach is decompressed. Duodenum is normally positioned as is the ligament of Treitz. No small bowel wall thickening. No small bowel dilatation. The terminal ileum is normal. The appendix is not well visualized, but there is no edema or inflammation in the region of the cecum. No gross colonic mass. No colonic wall thickening. Diverticular changes are noted in the left colon.  Vascular/Lymphatic: There is abdominal aortic atherosclerosis without aneurysm. There is no gastrohepatic or hepatoduodenal ligament lymphadenopathy. No retroperitoneal or mesenteric lymphadenopathy. No pelvic sidewall lymphadenopathy. Reproductive: Uterus surgically absent.  There is no adnexal mass. Other: Right percutaneous pelvic drain is identified with the distal loop formed in the right paramidline pelvis. The abscess cavity seen on prior imaging is substantially decreased in the interval measuring approximately 2.4 x 2.0 cm today compared to 5.9 x 6.3 cm on 01/28/2021. There is some trace fluid to the left of midline, similar to prior. Musculoskeletal: No worrisome lytic or sclerotic osseous abnormality. Umbilical hernia contains only fat. IMPRESSION: 1. Interval decrease in size of the right pelvic abscess status post percutaneous drain placement. Drain placement appear stable in the interval. 2. Hepatic steatosis. 3. Aortic Atherosclerosis (ICD10-I70.0). Electronically Signed   By: Kennith Center M.D.   On: 02/06/2021 12:03    ____________________________________________   PROCEDURES  Procedure(s) performed (including Critical Care):  .1-3 Lead EKG Interpretation  Date/Time: 02/06/2021 12:44 PM Performed by: Gilles Chiquito, MD Authorized by: Gilles Chiquito, MD     Interpretation: normal     ECG rate assessment: normal     Rhythm: sinus rhythm     Ectopy: none     Conduction: normal     ____________________________________________   INITIAL IMPRESSION / ASSESSMENT AND PLAN / ED COURSE      Patient presents with above-stated history and exam for assessment of weakness worsening of last couple days in setting of recently being hospitalized for diverticulitis with abscess discharge with drain in place.  On arrival she is afebrile and hemodynamically stable.  She denies any associated symptoms other than weakness.  She is getting daily nursing care at home.  She is on antibiotics.   Differential given nonlocalizing symptoms is fairly broad including arrhythmia, ACS, sepsis, metabolic derangements,  and dehydration.  No focal deficits to suggest CVA.  Low suspicion for toxic ingestion.  CT A/P Interval decrease in size of the right pelvic abscess with  drain in place.  There is hepatic steatosis.  Diverticulosis, kidney stone, perinephric stranding, evidence of pancreatitis, biliary duct dilation or other clear acute abdominopelvic process.  ECG and nonelevated troponin in the context of no history of chest pain or not suggestive of ACS.  She has no hypoxia or shortness of breath normal breath sounds to suggest pneumonia.  CMP shows no significant lecture to metabolic derangements.  Lactic is nonelevated.  CBC shows WBC count of 12.3 which seems to be baseline of last couple days with no acute anemia.  COVID and influenza is negative.  UA does not appear infected.  Patient given some fluids for some very mild dehydration although she has been tolerating p.o..  Given stable vitals with otherwise reassuring exam and work-up without evidence of infection low sufficient for sepsis and no evidence of significant metabolic derangement with improvement no signs of abscess I think she is stable for discharge back to her home where she is receiving daily nursing care.  Advised her importance of adequate p.o. hydration.  Advised her she can continue take all of her medications including her antibiotics.  Discharged stable condition.  Strict return precautions advised and discussed.       ____________________________________________   FINAL CLINICAL IMPRESSION(S) / ED DIAGNOSES  Final diagnoses:  Weakness  Dehydration    Medications  lactated ringers bolus 1,000 mL (0 mLs Intravenous Stopped 02/06/21 1227)     ED Discharge Orders     None        Note:  This document was prepared using Dragon voice recognition software and may include unintentional dictation errors.     Gilles Chiquito, MD 02/06/21 [id number]

## 2021-02-06 NOTE — ED Triage Notes (Signed)
Pt to ED from home [facility] Has yellow MOST form with her Pt was discharged from [facility] 4d ago, had JP drain placed, R side abdomen, "for something on my colon"; also was dx'd with diverticulitis After returning home pt has been feeling weak, hard to get around house with walker  EDP has seen pt VS with EMS were normal, CBG was 102

## 2021-02-07 DIAGNOSIS — I10 Essential (primary) hypertension: Secondary | ICD-10-CM | POA: Diagnosis not present

## 2021-02-07 DIAGNOSIS — G20 Parkinson's disease: Secondary | ICD-10-CM | POA: Diagnosis not present

## 2021-02-07 DIAGNOSIS — B9620 Unspecified Escherichia coli [E. coli] as the cause of diseases classified elsewhere: Secondary | ICD-10-CM | POA: Diagnosis not present

## 2021-02-07 DIAGNOSIS — K5720 Diverticulitis of large intestine with perforation and abscess without bleeding: Secondary | ICD-10-CM | POA: Diagnosis not present

## 2021-02-07 DIAGNOSIS — Z4801 Encounter for change or removal of surgical wound dressing: Secondary | ICD-10-CM | POA: Diagnosis not present

## 2021-02-07 DIAGNOSIS — Z48815 Encounter for surgical aftercare following surgery on the digestive system: Secondary | ICD-10-CM | POA: Diagnosis not present

## 2021-02-07 DIAGNOSIS — A419 Sepsis, unspecified organism: Secondary | ICD-10-CM | POA: Diagnosis not present

## 2021-02-07 DIAGNOSIS — E1140 Type 2 diabetes mellitus with diabetic neuropathy, unspecified: Secondary | ICD-10-CM | POA: Diagnosis not present

## 2021-02-07 DIAGNOSIS — E1159 Type 2 diabetes mellitus with other circulatory complications: Secondary | ICD-10-CM | POA: Diagnosis not present

## 2021-02-10 ENCOUNTER — Telehealth: Payer: Medicare HMO

## 2021-02-10 DIAGNOSIS — Z4801 Encounter for change or removal of surgical wound dressing: Secondary | ICD-10-CM | POA: Diagnosis not present

## 2021-02-10 DIAGNOSIS — B9620 Unspecified Escherichia coli [E. coli] as the cause of diseases classified elsewhere: Secondary | ICD-10-CM | POA: Diagnosis not present

## 2021-02-10 DIAGNOSIS — G20 Parkinson's disease: Secondary | ICD-10-CM | POA: Diagnosis not present

## 2021-02-10 DIAGNOSIS — E1140 Type 2 diabetes mellitus with diabetic neuropathy, unspecified: Secondary | ICD-10-CM | POA: Diagnosis not present

## 2021-02-10 DIAGNOSIS — E1159 Type 2 diabetes mellitus with other circulatory complications: Secondary | ICD-10-CM | POA: Diagnosis not present

## 2021-02-10 DIAGNOSIS — A419 Sepsis, unspecified organism: Secondary | ICD-10-CM | POA: Diagnosis not present

## 2021-02-10 DIAGNOSIS — Z48815 Encounter for surgical aftercare following surgery on the digestive system: Secondary | ICD-10-CM | POA: Diagnosis not present

## 2021-02-10 DIAGNOSIS — I10 Essential (primary) hypertension: Secondary | ICD-10-CM | POA: Diagnosis not present

## 2021-02-10 DIAGNOSIS — K5720 Diverticulitis of large intestine with perforation and abscess without bleeding: Secondary | ICD-10-CM | POA: Diagnosis not present

## 2021-02-11 DIAGNOSIS — I10 Essential (primary) hypertension: Secondary | ICD-10-CM | POA: Diagnosis not present

## 2021-02-11 DIAGNOSIS — E1140 Type 2 diabetes mellitus with diabetic neuropathy, unspecified: Secondary | ICD-10-CM | POA: Diagnosis not present

## 2021-02-11 DIAGNOSIS — G20 Parkinson's disease: Secondary | ICD-10-CM | POA: Diagnosis not present

## 2021-02-11 DIAGNOSIS — Z4801 Encounter for change or removal of surgical wound dressing: Secondary | ICD-10-CM | POA: Diagnosis not present

## 2021-02-11 DIAGNOSIS — B9620 Unspecified Escherichia coli [E. coli] as the cause of diseases classified elsewhere: Secondary | ICD-10-CM | POA: Diagnosis not present

## 2021-02-11 DIAGNOSIS — K5720 Diverticulitis of large intestine with perforation and abscess without bleeding: Secondary | ICD-10-CM | POA: Diagnosis not present

## 2021-02-11 DIAGNOSIS — A419 Sepsis, unspecified organism: Secondary | ICD-10-CM | POA: Diagnosis not present

## 2021-02-11 DIAGNOSIS — Z48815 Encounter for surgical aftercare following surgery on the digestive system: Secondary | ICD-10-CM | POA: Diagnosis not present

## 2021-02-11 DIAGNOSIS — E1159 Type 2 diabetes mellitus with other circulatory complications: Secondary | ICD-10-CM | POA: Diagnosis not present

## 2021-02-12 ENCOUNTER — Telehealth: Payer: Self-pay | Admitting: Nurse Practitioner

## 2021-02-12 DIAGNOSIS — A419 Sepsis, unspecified organism: Secondary | ICD-10-CM | POA: Diagnosis not present

## 2021-02-12 DIAGNOSIS — Z4801 Encounter for change or removal of surgical wound dressing: Secondary | ICD-10-CM | POA: Diagnosis not present

## 2021-02-12 DIAGNOSIS — Z48815 Encounter for surgical aftercare following surgery on the digestive system: Secondary | ICD-10-CM | POA: Diagnosis not present

## 2021-02-12 DIAGNOSIS — B9620 Unspecified Escherichia coli [E. coli] as the cause of diseases classified elsewhere: Secondary | ICD-10-CM | POA: Diagnosis not present

## 2021-02-12 DIAGNOSIS — K5720 Diverticulitis of large intestine with perforation and abscess without bleeding: Secondary | ICD-10-CM | POA: Diagnosis not present

## 2021-02-12 DIAGNOSIS — E1159 Type 2 diabetes mellitus with other circulatory complications: Secondary | ICD-10-CM | POA: Diagnosis not present

## 2021-02-12 DIAGNOSIS — G20 Parkinson's disease: Secondary | ICD-10-CM | POA: Diagnosis not present

## 2021-02-12 DIAGNOSIS — E1140 Type 2 diabetes mellitus with diabetic neuropathy, unspecified: Secondary | ICD-10-CM | POA: Diagnosis not present

## 2021-02-12 DIAGNOSIS — I10 Essential (primary) hypertension: Secondary | ICD-10-CM | POA: Diagnosis not present

## 2021-02-12 NOTE — Telephone Encounter (Signed)
I received message from [place], Ms. Marcy granddaughter;   Hi Jayro Mcmath! I was reaching out to see if we could discuss my grandmother Nada Maclachlan) She had an appt yesterday and in lieu of major surgery, she would like to opt for comfort care. I have emailed her surgeon as her [facility] nurse Frances Furbish) said she would need an order for Hospice. I was just unsure of the next steps we should take.   Case wrote up for Hospice physicians for eligibility and found eligible.  EMMAROSE KLINKE is a 80 y.o. year old female  with multiple medical problems including cva, depression, DM, HTN, obesity, OA, RA, fibromyalgia, tremors.  Saw surgeon today: Diverticulitis of large intestine with perforation and abscess with purulent drainage out of drain; wishes comfort care at home, no surgery   Functionally pps was 40% now 30%; requires to be bathed, dressed. Appetite poor weight 250lbs  01/27/2021 to 02/02/2021 Hospitalized Sepsis secondary to acute diverticulitis with abscess 02/06/2021 ED visit weakness; dehydration  DNR; Wishes for comfort at home, granddaughter is hospice nurse and feels like it is time for hospice with recent events, decline  Notified Valerie of Hospice eligibility, attempted to contact primary for Hospice order, notified Referral intake at [employer] [facility]

## 2021-02-13 ENCOUNTER — Telehealth: Payer: Self-pay | Admitting: Internal Medicine

## 2021-02-13 NOTE — Telephone Encounter (Signed)
Best contact:  [phone]  Requesting Oral CTI and to see if PCP will be the attending MD for the patient  [place] called from [facility]

## 2021-02-13 NOTE — Chronic Care Management (AMB) (Signed)
    Clinical Social Work  Chronic Care Management   Phone Outreach    02/13/2021 Name: Cynthia Jones MRN: [id number] DOB: 10/08/1940  Cynthia Jones is a 80 y.o. year old female who is a primary care patient of Vigg, Avanti, MD .   CCM LCSW reached out to patient today by phone to introduce self, assess needs and offer Care Management services and interventions.    Unable to keep phone appointment today and requested to reschedule.  Plan:Appointment was rescheduled with CCM LCSW for 02/20/21  Review of patient status, including review of consultants reports, relevant laboratory and other test results, and collaboration with appropriate care team members and the patient's provider was performed as part of comprehensive patient evaluation and provision of care management services.    Jenel Lucks, MSW, LCSW [facility] Care Management [place]  [facility] [email or web address] Phone [phone] 4:38 PM

## 2021-02-14 ENCOUNTER — Telehealth: Payer: Self-pay | Admitting: Internal Medicine

## 2021-02-14 NOTE — Telephone Encounter (Signed)
[place] calling from [employer] is calling to get a verbal approval that Dr.vigg is able to be that attending provider for the patient under hospice care. [place] is asking if a response can be given. Please advise CB- [phone]

## 2021-02-14 NOTE — Telephone Encounter (Signed)
Not a patient at [facility]. Thanks

## 2021-02-18 ENCOUNTER — Other Ambulatory Visit: Payer: Self-pay | Admitting: Internal Medicine

## 2021-02-19 ENCOUNTER — Ambulatory Visit
Admission: RE | Admit: 2021-02-19 | Discharge: 2021-02-19 | Disposition: A | Discharge status: Home / Self Care | Payer: Medicare HMO | Source: Ambulatory Visit | Attending: General Surgery | Admitting: General Surgery

## 2021-02-19 ENCOUNTER — Other Ambulatory Visit: Payer: Self-pay

## 2021-02-19 ENCOUNTER — Encounter: Payer: Self-pay | Admitting: Internal Medicine

## 2021-02-19 ENCOUNTER — Ambulatory Visit
Admission: RE | Admit: 2021-02-19 | Discharge: 2021-02-19 | Disposition: A | Discharge status: Home / Self Care | Payer: Medicare HMO | Source: Ambulatory Visit | Attending: Student | Admitting: Student

## 2021-02-19 DIAGNOSIS — K651 Peritoneal abscess: Secondary | ICD-10-CM

## 2021-02-19 DIAGNOSIS — Z978 Presence of other specified devices: Secondary | ICD-10-CM | POA: Diagnosis not present

## 2021-02-19 DIAGNOSIS — K429 Umbilical hernia without obstruction or gangrene: Secondary | ICD-10-CM | POA: Diagnosis not present

## 2021-02-19 DIAGNOSIS — I878 Other specified disorders of veins: Secondary | ICD-10-CM | POA: Diagnosis not present

## 2021-02-19 DIAGNOSIS — M5137 Other intervertebral disc degeneration, lumbosacral region: Secondary | ICD-10-CM | POA: Diagnosis not present

## 2021-02-19 DIAGNOSIS — K3189 Other diseases of stomach and duodenum: Secondary | ICD-10-CM | POA: Diagnosis not present

## 2021-02-19 DIAGNOSIS — K5720 Diverticulitis of large intestine with perforation and abscess without bleeding: Secondary | ICD-10-CM | POA: Diagnosis not present

## 2021-02-19 DIAGNOSIS — K632 Fistula of intestine: Secondary | ICD-10-CM | POA: Diagnosis not present

## 2021-02-19 HISTORY — PX: IR RADIOLOGIST EVAL & MGMT: IMG5224

## 2021-02-19 MED ORDER — IOPAMIDOL (ISOVUE-300) INJECTION 61%
100.0000 mL | Freq: Once | INTRAVENOUS | Status: AC | PRN
  Administered 2021-02-19: 100 mL via INTRAVENOUS

## 2021-02-19 NOTE — Telephone Encounter (Signed)
Pt needs a virtual appointment thanks, need to document and happy to sign what they need

## 2021-02-19 NOTE — Telephone Encounter (Signed)
Please get patient scheduled per Dr.Vigg

## 2021-02-19 NOTE — Progress Notes (Signed)
Referring Physician(s): Hazle Quant, E  Chief Complaint: The patient is seen in follow up today s/p CT-guided percutaneous catheter drainage of pelvic colonic diverticular abscess placed in IR 01/29/21  History of present illness:  Colonic diverticulitis with abscess formation in the central pelvis posterior and inferior to the sigmoid colon.  Pt here today for follow up CT and drain injection of abscess. Denied fever/chills Denies N/V Denies pain  Finished antibiotics 1 week ago Flushes once a day- 5-10 cc OP 10 cc-30 cc daily Report Status 02/03/2021 FINAL   Organism ID, Bacteria ESCHERICHIA COLI   Organism ID, Bacteria ENTEROCOCCUS AVIUM      Past Medical History:  Diagnosis Date   Acute anxiety 03/06/2015   Allergy    Anxiety    Degenerative joint disease of knee    Depression    Fibromyalgia    Obesity    Osteopenia    RAD (reactive airway disease)     Past Surgical History:  Procedure Laterality Date   ABDOMINAL HYSTERECTOMY     APPENDECTOMY     BREAST CYST ASPIRATION Right 04/18/2013   neg   CHOLECYSTECTOMY     EYE SURGERY     JOINT REPLACEMENT  2014   right knee   PAROTIDECTOMY Left    TONSILLECTOMY      Allergies: Nsaids and Erythromycin  Medications: Prior to Admission medications   Medication Sig Start Date End Date Taking? Authorizing Provider  b complex vitamins tablet Take 1 tablet by mouth daily.    [provider]  carbidopa-levodopa (SINEMET IR) 25-100 MG tablet Take 1 tablet by mouth 4 (four) times daily. 07/04/20   [provider]  diclofenac Sodium (VOLTAREN) 1 % GEL Apply 2 g topically as needed.  01/14/20   [provider]  DULoxetine (CYMBALTA) 60 MG capsule Take 60 mg by mouth daily. Patient not taking: Reported on 01/29/2021 12/25/20   [provider]  guaiFENesin (MUCINEX) 600 MG 12 hr tablet Take 1 tablet (600 mg total) by mouth 2 (two) times daily. 02/02/21   Regalado, Belkys A, MD   Ipratropium-Albuterol (COMBIVENT) 20-100 MCG/ACT AERS respimat Inhale 1 puff into the lungs every 6 (six) hours. 02/02/21   Regalado, Belkys A, MD  levothyroxine (SYNTHROID) 50 MCG tablet TAKE 1 TABLET BY MOUTH EVERY DAY 09/13/20   Cannady, Jolene T, NP  lidocaine (LIDODERM) 5 % PLACE 1 PATCH ONTO THE SKIN DAILY. REMOVE & DISCARD PATCH WITHIN 12 HOURS OR AS DIRECTED BY MD 02/18/21   Loura Pardon, MD  losartan (COZAAR) 100 MG tablet TAKE 1 TABLET BY MOUTH DAILY 11/23/20   Vigg, Avanti, MD  metFORMIN (GLUCOPHAGE) 500 MG tablet Take 1 tablet (500 mg total) by mouth daily with breakfast. 01/02/21   Vigg, Avanti, MD  mirtazapine (REMERON) 7.5 MG tablet TAKE 1 TABLET BY MOUTH AT BEDTIME. 01/26/21   Vigg, Avanti, MD  nystatin (MYCOSTATIN/NYSTOP) powder APPLY TO AFFECTED AREA 3 TIMES A DAY 12/24/20   Vigg, Avanti, MD  pregabalin (LYRICA) 25 MG capsule Take 1 capsule (25 mg total) by mouth daily. 11/28/20   Vigg, Avanti, MD  Vitamin D, Ergocalciferol, (DRISDOL) 1.25 MG (50000 UNIT) CAPS capsule TAKE 1 CAPSULE (50,000 UNITS TOTAL) BY MOUTH EVERY 7 (SEVEN) DAYS 02/10/21   Loura Pardon, MD     Family History  Problem Relation Age of Onset   Heart disease Mother    Stroke Mother    Diabetes Mother    Alzheimer's disease Father    Parkinson's disease Father  Cancer Sister     Social History   Socioeconomic History   Marital status: Widowed    Spouse name: Not on file   Number of children: Not on file   Years of education: Not on file   Highest education level: 10th grade  Occupational History   Not on file  Tobacco Use   Smoking status: Never   Smokeless tobacco: Never  Vaping Use   Vaping Use: Never used  Substance and Sexual Activity   Alcohol use: No    Alcohol/week: 0.0 standard drinks   Drug use: No   Sexual activity: Not on file  Other Topics Concern   Not on file  Social History Narrative   Not on file   Social Determinants of Health   Financial Resource Strain: Low Risk     Difficulty of Paying Living Expenses: Not hard at all  Food Insecurity: No Food Insecurity   Worried About Programme researcher, broadcasting/film/video in the Last Year: Never true   Ran Out of Food in the Last Year: Never true  Transportation Needs: No Transportation Needs   Lack of Transportation (Medical): No   Lack of Transportation (Non-Medical): No  Physical Activity: Insufficiently Active   Days of Exercise per Week: 7 days   Minutes of Exercise per Session: 20 min  Stress: No Stress Concern Present   Feeling of Stress : Not at all  Social Connections: Not on file     Vital Signs: Afeb  Physical Exam Skin:    General: Skin is warm.     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    Imaging: No results found.  Labs:  CBC: Recent Labs    01/31/21 0419 02/01/21 0441 02/02/21 0606 02/06/21 1010  WBC 12.1* 11.8* 11.6* 12.3*  HGB 11.2* 11.5* 11.8* 12.8  HCT 33.5* 34.5* 36.0 38.8  PLT 274 321 369 394    COAGS: Recent Labs    01/27/21 2148  INR 1.1  APTT 45*    BMP: Recent Labs    03/01/20 1639 01/27/21 2148 01/31/21 0419 02/01/21 0441 02/02/21 0606 02/06/21 1010  NA 140   < > 137 138 137 135  K 4.4   < > 3.6 3.8 4.0 5.3*  CL 103   < > 106 104 102 102  CO2 21   < > 25 27 26 22   GLUCOSE 106*   < > 107* 128* 114* 128*  BUN 17   < > 7* 10 10 11   CALCIUM 9.4   < > 8.1* 8.5* 8.6* 9.2  CREATININE 0.89   < > 0.64 0.71 0.82 0.98  GFRNONAA 63   < > >60 >60 >60 59*  GFRAA 72  --   --   --   --   --    < > = values in this interval not displayed.    LIVER FUNCTION TESTS: Recent Labs    03/01/20 1639 01/27/21 2148 01/28/21 0431 02/06/21 1010  BILITOT 0.2 1.0 1.1 0.8  AST 17 20 17 24   ALT 15 6 10 8   ALKPHOS 51 42 38 47  PROT 7.2 7.6 6.5 7.9  ALBUMIN 4.1 3.4* 3.0* 3.2*    Assessment:  Colonic diverticular abscess Drain placed in IR  01/29/21 Follows with Dr Bea Laura Hazle Quant--- Granddaughter with pt today--- she says pt  has chosen to follow with Hospice at this time. Has discussed surgeries and options with Dr Hazle Quant--- will not choose these options. Plan is to change to gravity bag- flush gently maybe 2-3 x/week Return 3 weeks for follow up. Pt and grdaughter are agreeable.    Signed: Robet Leu, PA-C 02/19/2021, 1:55 PM   Please refer to Dr. Deanne Coffer attestation of this note for management and plan.

## 2021-02-20 ENCOUNTER — Ambulatory Visit: Payer: Medicare HMO | Admitting: Licensed Clinical Social Worker

## 2021-02-20 DIAGNOSIS — E1159 Type 2 diabetes mellitus with other circulatory complications: Secondary | ICD-10-CM

## 2021-02-20 DIAGNOSIS — E1140 Type 2 diabetes mellitus with diabetic neuropathy, unspecified: Secondary | ICD-10-CM

## 2021-02-20 DIAGNOSIS — I152 Hypertension secondary to endocrine disorders: Secondary | ICD-10-CM

## 2021-02-20 DIAGNOSIS — F3342 Major depressive disorder, recurrent, in full remission: Secondary | ICD-10-CM

## 2021-02-20 DIAGNOSIS — F419 Anxiety disorder, unspecified: Secondary | ICD-10-CM

## 2021-02-20 NOTE — Chronic Care Management (AMB) (Signed)
Chronic Care Management    Clinical Social Work Note  02/20/2021 Name: Cynthia Jones MRN: [id number] DOB: [birth year]  Cynthia Jones is a 80 y.o. year old female who is a primary care patient of Vigg, Avanti, MD. The CCM team was consulted to assist the patient with chronic disease management and/or care coordination needs related to: Level of Care Concerns.   Engaged with patient and granddaughter, Cynthia Jones by telephone for follow up visit in response to provider referral for social work chronic care management and care coordination services.   Consent to Services:  The patient was given information about Chronic Care Management services, agreed to services, and gave verbal consent prior to initiation of services.  Please see initial visit note for detailed documentation.   Patient agreed to services and consent obtained.  Assessment: Patient is engaged in conversation, continues to maintain positive progress with care plan goals. Patient is now followed by [facility] (hospice). CCM Services has been discontinued and no additional follow up is required. Follow up Plan: Patient does not require or desire continued follow-up. Will contact the office if needed. Patient is aware of scheduled appointment with PCP for tomorrow, June 24, 22    SDOH (Social Determinants of Health) assessments and interventions performed:    Advanced Directives Status: Not addressed in this encounter.  CCM Care Plan  Allergies  Allergen Reactions   Nsaids Other (See Comments)    GI upset, hx of H pylori and gastric ulcer   Erythromycin Nausea Only    Outpatient Encounter Medications as of 02/20/2021  Medication Sig   b complex vitamins tablet Take 1 tablet by mouth daily.   carbidopa-levodopa (SINEMET IR) 25-100 MG tablet Take 1 tablet by mouth 4 (four) times daily.   diclofenac Sodium (VOLTAREN) 1 % GEL Apply 2 g topically as needed.    DULoxetine (CYMBALTA) 60 MG capsule Take 60 mg by mouth  daily. (Patient not taking: Reported on 01/29/2021)   guaiFENesin (MUCINEX) 600 MG 12 hr tablet Take 1 tablet (600 mg total) by mouth 2 (two) times daily.   Ipratropium-Albuterol (COMBIVENT) 20-100 MCG/ACT AERS respimat Inhale 1 puff into the lungs every 6 (six) hours.   levothyroxine (SYNTHROID) 50 MCG tablet TAKE 1 TABLET BY MOUTH EVERY DAY   lidocaine (LIDODERM) 5 % PLACE 1 PATCH ONTO THE SKIN DAILY. REMOVE & DISCARD PATCH WITHIN 12 HOURS OR AS DIRECTED BY MD   losartan (COZAAR) 100 MG tablet TAKE 1 TABLET BY MOUTH DAILY   metFORMIN (GLUCOPHAGE) 500 MG tablet Take 1 tablet (500 mg total) by mouth daily with breakfast.   mirtazapine (REMERON) 7.5 MG tablet TAKE 1 TABLET BY MOUTH AT BEDTIME.   nystatin (MYCOSTATIN/NYSTOP) powder APPLY TO AFFECTED AREA 3 TIMES A DAY   pregabalin (LYRICA) 25 MG capsule Take 1 capsule (25 mg total) by mouth daily.   Vitamin D, Ergocalciferol, (DRISDOL) 1.25 MG (50000 UNIT) CAPS capsule TAKE 1 CAPSULE (50,000 UNITS TOTAL) BY MOUTH EVERY 7 (SEVEN) DAYS   No facility-administered encounter medications on file as of 02/20/2021.    Patient Active Problem List   Diagnosis Date Noted   Sepsis ([place]) 01/27/2021   Hyperlipidemia associated with type 2 diabetes mellitus ([place]) 01/27/2021   Parkinson disease ([place]) 09/25/2020   DDD (degenerative disc disease), lumbosacral 07/10/2020   Chronic lower extremity pain (Bilateral) 07/10/2020   Other intervertebral disc degeneration, lumbar region 07/10/2020   Change in voice 07/02/2020   Difficulty walking 07/02/2020   Tremor 07/02/2020   Thyroiditis 12/19/2019  Chronic knee pain (Left) 07/05/2019   Neurogenic pain 11/01/2018   Morbid obesity with BMI of 40.0-44.9, adult ([place]) 09/20/2018   Hypothyroid 09/15/2018   Vitamin D deficiency 09/05/2018   Elevated C-reactive protein (CRP) 08/09/2018   Elevated sed rate 08/09/2018   Pharmacologic therapy 08/08/2018   Disorder of skeletal system 08/08/2018   Problems influencing  health status 08/08/2018   Contusion of foot 03/09/2018   Advanced care planning/counseling discussion 04/12/2017   Lumbar spondylosis 10/07/2016   Hypercholesteremia 09/14/2016   Long term prescription opiate use 09/08/2016   Long term prescription benzodiazepine use 09/08/2016   Chronic pain syndrome 09/08/2016   Chronic low back pain (2ry area of Pain) (Bilateral) (L>R) 09/08/2016   Chronic knee pain (1ry area of Pain) (Bilateral) (L>R) 09/08/2016   Cervicogenic headache 09/08/2016   Chronic hip pain (Bilateral) 09/08/2016   Chronic hand pain (Bilateral) (R>L) 09/08/2016   Chronic foot pain (Left) 09/08/2016   Insomnia 88/41/6606   Folliculitis 30/16/0109   History of total knee replacement (Right) 04/17/2015   Fibromyalgia 04/09/2015   Bilateral occipital neuralgia 04/09/2015   Greater trochanteric bursitis 04/09/2015   Osteoarthritis of knee (Left) 04/09/2015   Sacroiliac joint disease 04/09/2015   Lumbar facet syndrome (Bilateral) (L>R) 04/09/2015   Neuropathy due to secondary diabetes ([place]) 04/09/2015   Osteopenia 03/06/2015   Anxiety 03/06/2015   Depression 03/06/2015   Hypertension associated with diabetes ([place]) 03/06/2015   Diabetes mellitus ([place]) 03/06/2015   Osteoarthritis 03/06/2015   Seasonal allergic rhinitis 03/06/2015   Obesity 03/06/2015   Intertrigo 03/06/2015    Conditions to be addressed/monitored: Dementia; Level of care concerns  Care Plan : RNCM: Depression (Adult)  Updates made by Rebekah Chesterfield, LCSW since 02/20/2021 12:00 AM     Problem: RNCM Anxiety and Depression Identification (Depression)   Priority: Medium     Long-Range Goal: RNCM: Anxiety and Depressive Symptoms Identified Completed 02/20/2021  This Visit's Progress: On track  Priority: Medium  Note:   Current Barriers:  Knowledge Deficits related to management of management of anxiety and depression Chronic Disease Management support and education needs related to unresolved  anxiety and depression Lacks caregiver support.  Transportation barriers Non-adherence to scheduled provider appointments Unable to independently manage depression and anxiety- loss of husband in death, has had a lot of change in the last couple of years. Back at the home she shared with her husband and this is helping a lot.  Does not attend all scheduled provider appointments Does not contact provider office for questions/concerns  Nurse Case Manager Clinical Goal(s):  Over the next 120 days, patient will verbalize understanding of plan for management of depression and anxiety Over the next 120 days, patient will attend all scheduled medical appointments: 04-14-2021 Over the next 120 days, patient will demonstrate improved adherence to prescribed treatment plan for anxiety and depression as evidenced byexpressing feelings to the CCM team, calling the office for changes in mood/anxiety/depression, following recommendations to pcp and utilization of support systems   Interventions:  1:1 collaboration with Charlynne Cousins, MD regarding development and update of comprehensive plan of care as evidenced by provider attestation and co-signature Inter-disciplinary care team collaboration (see longitudinal plan of care) Evaluation of current treatment plan related to anxiety and depression  and patient's adherence to plan as established by provider. 11-05-2020: The patient states she is very happy with her new pcp. She is happy today because it is her birthday and her family is with her. 01-08-2021: The patient states she is  doing better since the new medication changes. She states she had been a little anxious but she is sleeping better and she is feeling better.  Denies any new concerns with depression and anxiety. Advised patient to call the office for changes in mood/anxiety level/ or increased depressive state Provided education to patient re: support for anxiety and depression and alternative methods such  as mindfulness and relaxation to help with periods of anxiety and depression. 01-08-2021: The patient feels her anxiety and depression is better since medications.  Discussed plans with patient for ongoing care management follow up and provided patient with direct contact information for care management team Reviewed scheduled/upcoming provider appointments including: 04-04-2021  Patient Goals/Self-Care Activities Over the next 120 days, patient will:  - Patient will self administer medications as prescribed Patient will attend all scheduled provider appointments Patient will call pharmacy for medication refills Patient will attend church or other social activities Patient will call provider office for new concerns or questions Patient will work with BSW to address care coordination needs and will continue to work with the clinical team to address health care and disease management related needs.   - anxiety screen reviewed - depression screen reviewed - medication list reviewed  Follow Up Plan: Telephone follow up appointment with care management team member scheduled for: 04-04-2021        Task: RNCM: Identify Depressive Symptoms and Facilitate Treatment Completed 02/20/2021  Note:   Care Management Activities:    - anxiety screen reviewed - depression screen reviewed - medication list reviewed        Care Plan : RNCM: Chronic Pain (Adult)  Updates made by Christa See D, LCSW since 02/20/2021 12:00 AM     Problem: RNCM: Pain Management Plan (Chronic Pain)   Priority: Medium     Long-Range Goal: RNCM: Pain Management Plan Developed Completed 02/20/2021  This Visit's Progress: On track  Priority: Medium  Note:   Current Barriers:  Knowledge Deficits related to managing acute/chronic pain Non-adherence to scheduled provider appointments Non-adherence to prescribed medication regimen Difficulty obtaining medications Chronic Disease Management support and education needs  related to chronic pain Unable to independently manage chronic pain and discomfort  Does not attend all scheduled provider appointments Does not contact provider office for questions/concerns  Nurse Case Manager Clinical Goal(s):  Over the next 120 days, patient will verbalize understanding of plan for managing pain Over the next 120 days, patient will attend all scheduled medical appointments: 04-04-2021  Over the next 120   days patient will demonstrate use of different relaxation  skills and/or diversional activities to assist with pain reduction (distraction, imagery, relaxation, massage, acupressure, TENS, heat, and cold application Over the next 120    days patient will report pain at a level less than 3 to 4 on a 10-10 rating scale Over the next  120  days patient will use pharmacological and nonpharmacological pain relief strategies Over the next 120  days patient will verbalize acceptable level of pain relief and ability to engage in desired activities Over the next  120 days patient will engage in desired activities without an increase in pain level  Interventions:  - deep breathing, relaxation and mindfulness use promoted - effectiveness of pharmacologic therapy monitored- 01-08-2021: Has had changes in her medications and the patient states they are working well for her. Denies any falls or pain today.   - misuse of pain medication assessed - motivation and barriers to change assessed and addressed - mutually acceptable comfort  goal set - pain assessed- 04-04-2021: Denies pain at this time - pain treatment goals reviewed Evaluation of current treatment plan related to Chronic pain and discomfort  and patient's adherence to plan as established by provider. Advised patient to call the provider for worsening pain or discomfort Provided education to patient re: alternative pain relief measure Reviewed medications with patient and discussed compliance.  The patient states that her pain is  well controlled at this time. Is still going to pain management. 04-04-2021:State that she is doing well with the medication changes  Collaborated with CCM team and pcp regarding management of pain and discomfort effectively Discussed plans with patient for ongoing care management follow up and provided patient with direct contact information for care management team Allow patient to maintain a diary of pain ratings, timing, precipitating events, medications, treatments, and what works best to relieve pain,  Refer to support groups and self-help groups Educate patient about the use of pharmacological interventions for pain management- antianxiety, antidepressants, NSAIDS, opioid analgesics,  Explain the importance of lifestyle modifications to effective pain management   Patient Goals/Self Care Activities:  Patient verbalizes understanding of plan to manage chronic pain and discomfort  Self-administers medications as prescribed Attends all scheduled provider appointments Calls pharmacy for medication refills Calls provider office for new concerns or questions - mutually acceptable comfort goal set - pain assessed - pain management plan developed - pain treatment goals reviewed - patient response to treatment assessed - sharing of pain management plan with teachers and other caregivers encouraged  Follow Up Plan: Telephone follow up appointment with care management team member scheduled for: 03-19-2021 at 1:00 pm      Task: RNCM: Partner to Develop Chronic Pain Management Plan Completed 02/20/2021  Note:   Care Management Activities:    - mutually acceptable comfort goal set - pain assessed - pain management plan developed - pain treatment goals reviewed - patient response to treatment assessed - sharing of pain management plan with teachers and other caregivers encouraged        Care Plan : RNCM: Hypertension (Adult)  Updates made by Christa See D, LCSW since 02/20/2021 12:00 AM      Problem: RNCM: Hypertension (Hypertension)   Priority: Medium     Long-Range Goal: RNCM: Hypertension Monitored Completed 02/20/2021  This Visit's Progress: On track  Priority: Medium  Note:   Objective:  Last practice recorded BP readings:  BP Readings from Last 3 Encounters:  01/02/21 (!) 128/54  11/21/20 132/78  10/31/20 (!) 89/57     Most recent eGFR/CrCl: No results found for: EGFR  No components found for: CRCL Current Barriers:  Knowledge Deficits related to basic understanding of hypertension pathophysiology and self care management Knowledge Deficits related to understanding of medications prescribed for management of hypertension Non-adherence to scheduled provider appointments Transportation barriers Unable to independently manage HTN Does not attend all scheduled provider appointments Does not contact provider office for questions/concerns Case Manager Clinical Goal(s):  Over the next 120 days, patient will verbalize understanding of plan for hypertension management Over the next 120 days, patient will attend all scheduled medical appointments: 04-04-2021 Over the next 120 days, patient will demonstrate improved adherence to prescribed treatment plan for hypertension as evidenced by taking all medications as prescribed, monitoring and recording blood pressure as directed, adhering to low sodium/DASH diet Over the next 120 days, patient will demonstrate improved health management independence as evidenced by checking blood pressure as directed and notifying PCP if SBP>160 or DBP >  90, taking all medications as prescribe, and adhering to a low sodium diet as discussed. Over the next 120 days, patient will verbalize basic understanding of hypertension disease process and self health management plan as evidenced by compliance with recommendations by the provider Interventions:  Evaluation of current treatment plan related to hypertension self management and patient's  adherence to plan as established by provider. 01-08-2021: The patient states that she is not having issues with light headedness or dizziness, she does not have a way to check her blood pressure. Pressure was low in the pcp office last week. Review of sx/sx of hypotension.  Provided education to patient re: stroke prevention, s/s of heart attack and stroke, DASH diet, complications of uncontrolled blood pressure Reviewed medications with patient and discussed importance of compliance. 01-08-2021: The patient endorses compliance with medications Discussed plans with patient for ongoing care management follow up and provided patient with direct contact information for care management team Advised patient, providing education and rationale, to monitor blood pressure daily and record, calling PCP for findings outside established parameters.  Reviewed scheduled/upcoming provider appointments including: 04-04-2021 at 11 am Patient Goals/Self-Care Activities Over the next 120 days, patient will:  - Attends all scheduled provider appointments Calls provider office for new concerns, questions, or BP outside discussed parameters Checks BP and records as discussed Follows a low sodium diet/DASH diet - depression screen reviewed - home or ambulatory blood pressure monitoring encouraged Follow Up Plan: Telephone follow up appointment with care management team member scheduled for: 03-19-2021 at 1:00 pm     Task: RNCM: Identify and Monitor Blood Pressure Elevation Completed 02/20/2021  Note:   Care Management Activities:    - depression screen reviewed - home or ambulatory blood pressure monitoring encouraged        Care Plan : RNCM: Diabetes Type 2 (Adult)  Updates made by Rebekah Chesterfield, LCSW since 02/20/2021 12:00 AM     Problem: RNCM: Glycemic Management (Diabetes, Type 2)   Priority: Medium     Long-Range Goal: RNCM: Glycemic Management Optimized Completed 02/20/2021  This Visit's Progress: On  track  Priority: Medium  Note:   Objective:  Lab Results  Component Value Date   HGBA1C 7.1 (H) 11/14/2020     Lab Results  Component Value Date   CREATININE 0.89 03/01/2020   CREATININE 0.76 06/16/2019   CREATININE 0.74 03/09/2019   No results found for: EGFR Current Barriers:  Knowledge Deficits related to basic Diabetes pathophysiology and self care/management Knowledge Deficits related to medications used for management of diabetes Unable to independently manage DM Does not attend all scheduled provider appointments Case Manager Clinical Goal(s):  Over the next 120 days, patient will demonstrate improved adherence to prescribed treatment plan for diabetes self care/management as evidenced by:  daily monitoring and recording of CBG  adherence to ADA/ carb modified diet adherence to prescribed medication regimen Interventions:  Provided education to patient about basic DM disease process Reviewed medications with patient and discussed importance of medication adherence. 01-08-2021: The patient states that she has started her Metformin back. She has no way of checking her blood sugars. Will collaborate with the pharm D and pcp to see what the recommendations are for blood sugar checks.  Discussed plans with patient for ongoing care management follow up and provided patient with direct contact information for care management team Provided patient with written educational materials related to hypo and hyperglycemia and importance of correct treatment. 01-08-2021: The patient and the patients daughter state she  does not have a glucose meter to check her blood sugars. The patient states that she feels fine but her daughter says she does not always eat well and sometimes she does notice the patient gets shaky. The patient does drink a Glucerna if she doesn't eat a meal. Education on staying hydrated and eating small meals. Will continue to monitor and educate appropriately.   Reviewed  scheduled/upcoming provider appointments including: 04-04-2021 at 11 am Advised patient, providing education and rationale, to check cbg BID and record, calling pcp for findings outside established parameters.  01-08-2021: Will discuss with the pcp about getting the patient a script for a glucose meter.  Review of patient status, including review of consultants reports, relevant laboratory and other test results, and medications completed. Patient Goals/Self-Care Activities Over the next 120 days, patient will:  - UNABLE to independently manage DM as evidence of hemoglobin A1C 7.1 Self administers oral medications as prescribed Attends all scheduled provider appointments Checks blood sugars as prescribed and utilize hyper and hypoglycemia protocol as needed Adheres to prescribed ADA/carb modified - barriers to adherence to treatment plan identified - blood glucose monitoring encouraged - individualized medical nutrition therapy provided - resources required to improve adherence to care identified - self-awareness of signs/symptoms of hypo or hyperglycemia encouraged - use of blood glucose monitoring log promoted Follow Up Plan: Telephone follow up appointment with care management team member scheduled for: 03-19-2021 at 1:00 pm  riority:  Medium Start Date:                             Expected End Date:     11-05-2021                  Follow Up Date 03-19-2021 at 1 pm  01-08-2021: The patients daughter states that the patient does not have any way of checking her blood sugars. Will collaborate with pcp and pharm D about the patient getting a meter to check blood sugars.  Review of hypoglycemia and hyperglycemia.  The patient sometimes does not eat well and the daughter was concerned. Education and support given.    - check blood sugar at prescribed times - check blood sugar before and after exercise - check blood sugar if I feel it is too high or too low - enter blood sugar readings and  medication or insulin into daily log    Why is this important?   Checking your blood sugar at home helps to keep it from getting very high or very low.  Writing the results in a diary or log helps the doctor know how to care for you.  Your blood sugar log should have the time, date and the results.  Also, write down the amount of insulin or other medicine that you take.  Other information, like what you ate, exercise done and how you were feeling, will also be helpful.         Task: RNCM: Alleviate Barriers to Glycemic Management Completed 02/20/2021  Note:   Care Management Activities:    - barriers to adherence to treatment plan identified - blood glucose monitoring encouraged - individualized medical nutrition therapy provided - resources required to improve adherence to care identified - self-awareness of signs/symptoms of hypo or hyperglycemia encouraged - use of blood glucose monitoring log promoted        Care Plan : General Social Work (Adult)  Updates made by Rebekah Chesterfield, LCSW since  02/20/2021 12:00 AM     Problem: Health Promotion or Disease Self-Management (General Plan of Care)      Long-Range Goal: Self-Management Plan Developed Completed 02/20/2021  Start Date: 11/11/2020  This Visit's Progress: On track  Priority: Medium  Note:   Timeframe:  Long-Range Goal Priority:  Medium Start Date: 11/11/20                          Expected End Date: 02/20/21                   Current Barriers:  Chronic Mental Health needs related to depression, anxiety and most recently- grief Limited social support ADL IADL limitations Mental Health Concerns  Social Isolation Memory Deficits Suicidal Ideation/Homicidal Ideation: No  Clinical Social Work Goal(s):  Over the next 120 days, patient will work with SW bi-monthly by telephone or in person to reduce or manage symptoms related to depression, grief and anxiety Over the next 120 days, patient will demonstrate improved  health management independence as evidenced by implementing appropriate self-care and mental health coping tools into her daily routine to combat stressors.  Interventions: Patient interviewed and appropriate assessments performed: brief mental health assessment CCM spoke with patient and her granddaughter, Cynthia Jones Patient began services through [facility] last Monday, June 13, 22. They have an appointment with PCP scheduled for June 24, 22 to discuss having her as an attending provider Per family, Hospice has been visitiing the home and it's been a positive experience Cynthia Jones is requesting PCP to update pt's diagnosis list by adding Alzheimers  Discussed plans with patient for ongoing care management follow up and provided patient with direct contact information for care management team Collaborated with primary care provider  Patient Self Care Activities:  Contact office with any questions or concerns Attend all scheduled appointments with providers       Christa See, MSW, [email or web address] Phone [phone] 9:41 AM

## 2021-02-20 NOTE — Patient Instructions (Signed)
Visit Information   Goals Addressed               This Visit's Progress     Patient Stated     COMPLETED: SW- "I lost my brother and my husband" (pt-stated)        Current Barriers:  Chronic Mental Health needs related to depression, anxiety and most recently- grief Limited social support ADL IADL limitations Mental Health Concerns  Social Isolation Memory Deficits Suicidal Ideation/Homicidal Ideation: No  Clinical Social Work Goal(s):  Over the next 120 days, patient will work with SW  bi-monthly  by telephone or in person to reduce or manage symptoms related to depression, grief and anxiety Over the next 120 days, patient will demonstrate improved health management independence as evidenced by implementing appropriate self-care and mental health coping tools into her daily routine to combat stressors.  Interventions: Patient interviewed and appropriate assessments performed: brief mental health assessment LCSW spoke with patient on 07/08/20. Patient reports that she is stable. She shares that she keeps the telephone by hear 24/7 in case of an emergency. She shares that she has large family that consist of 4 children, 4 grandchildren and 6 great grandchildren and with their assistance she is able to take appropriate care of herself. Patient is very adamant about not wanting long term placement.  Patient reports that recent medication adjustment by PCP has been helpful to her chronic pain. Patient shares that she has been experiencing ongoing tremors (especially in her left hand) and PCP referred her to a neurologist. Dr. Malvin Johns had her start Sinemet 25/100 1 pill 3 times a day for this concern and patient reports that she has already seen a difference. She is hopeful that this medication will continue to be effective as she just started it last week.  Patient has a strong family support network which includes her daughter who lives one hour away, a son that resides with her and another  son that lives across from her. They all provides ongoing care giving and supervision but patient continues to have level of care concerns as they are only able to do so much and patient is not agreeable to placement. Patient shares that her granddaughter visits her often for socialization with her young great grandchildren.  Family have made great efforts to accommodate patient and her safety needs within her home. They completed some home repairs in her bedroom to improve patient's living condition. Family reported in the past that they eventually want to build a walk in shower for patient as well.  Patient shares that she often sits in her lift chair and watches television. Health self-care education provided to encourage patient to increase physical activity.  Patient interviewed and appropriate assessments performed Provided mental health counseling with regard to anxiety, depression, grief support. Patient lost her spouse of 50 years and brother. Patient denies wanting any mental health involvement on 07/08/20. LCSW provided patient with information about available grief support resources within the area ([facility] and [facility]). LCSW sent an email in the past to family with this information in case she wishes to utilize these resources. Patient worked with Tax adviser at [facility] in the past but discontinued services after 3 telephonic visits because family reported that patient was not willing to talk or open up with therapist.    Discussed plans with patient for ongoing care management follow up and provided patient with direct contact information for care management team Collaborated with primary care provider re: medication  concerns Assisted patient/caregiver with obtaining information about health plan benefits Provided education and assistance to client regarding Advanced Directives. Provided education to patient/caregiver regarding level of care options. Provided education to  patient/caregiver about Hospice and/or Palliative Care services Patient declines any recent falls.  Patient's daughter reported having to start therapy due to ongoing stress and caregiver burnout.   Patient Self Care Activities:  Motivation for treatment Strong family or social support  Patient Coping Strengths:  Supportive Relationships Family Hopefulness  Patient Self Care Deficits:  Lacks social connections Unable to perform ADLs independently Unable to perform IADLs independently  Please see past updates related to this goal by clicking on the "Past Updates" button in the selected goal         Other     COMPLETED: Increase water intake        Recommend drinking at least 6-8 glasses of water a day       COMPLETED: Patient Stated        04/19/2020, wants to be more independent       COMPLETED: RNCM: Monitor and Manage My Blood Sugar-Diabetes Type 2        Timeframe:  Long-Range Goal Priority:  Medium Start Date:                             Expected End Date:     11-05-2021                  Follow Up Date 03-19-2021 at 1 pm  01-08-2021: The patients daughter states that the patient does not have any way of checking her blood sugars. Will collaborate with pcp and pharm D about the patient getting a meter to check blood sugars.  Review of hypoglycemia and hyperglycemia.  The patient sometimes does not eat well and the daughter was concerned. Education and support given.    - check blood sugar at prescribed times - check blood sugar before and after exercise - check blood sugar if I feel it is too high or too low - enter blood sugar readings and medication or insulin into daily log    Why is this important?   Checking your blood sugar at home helps to keep it from getting very high or very low.  Writing the results in a diary or log helps the doctor know how to care for you.  Your blood sugar log should have the time, date and the results.  Also, write down the amount of  insulin or other medicine that you take.  Other information, like what you ate, exercise done and how you were feeling, will also be helpful.            COMPLETED: SW-Track and Manage My Symptoms-Depression        Evidence-based guidance:  Review biopsychosocial determinants of health screens.  Determine level of modifiable health risk.  Assess level of patient activation, level of readiness, importance and confidence to make changes.  Evoke change talk using open-ended questions, pros and cons, as well as looking forward.  Identify areas where behavior change may lead to improved health.  Partner with patient to develop a robust self-management plan that includes lifestyle factors, such as weight loss, exercise and healthy nutrition, as well as goals specific to disease risks.  Support patient and family/caregiver active participation in decision-making and self-management plan.  Implement additional goals and interventions based on identified risk factors to reduce health  risk.  Facilitate advance care planning.  Review need for preventive screening based on age, sex, family history and health history.   Notes:   Timeframe:  Long-Range Goal Priority:  Medium Start Date: 11/11/20                          Expected End Date: 02/11/21                   Follow Up Date-12/30/20   - avoid negative self-talk - develop a personal safety plan - develop a plan to deal with triggers like holidays, anniversaries - exercise at least 2 to 3 times per week - have a plan for how to handle bad days - journal feelings and what helps to feel better or worse - spend time or talk with others at least 2 to 3 times per week - spend time or talk with others every day - watch for early signs of feeling worse - write in journal every day    Why is this important?   Keeping track of your progress will help your treatment team find the right mix of medicine and therapy for you.  Write in your journal every  day.  Day-to-day changes in depression symptoms are normal. It may be more helpful to check your progress at the end of each week instead of every day.     Current Barriers:  Chronic Mental Health needs related to depression, anxiety and most recently- grief Limited social support ADL IADL limitations Mental Health Concerns  Social Isolation Memory Deficits Suicidal Ideation/Homicidal Ideation: No  Clinical Social Work Goal(s):  Over the next 120 days, patient will work with SW bi-monthly by telephone or in person to reduce or manage symptoms related to depression, grief and anxiety Over the next 120 days, patient will demonstrate improved health management independence as evidenced by implementing appropriate self-care and mental health coping tools into her daily routine to combat stressors.  Interventions: Patient interviewed and appropriate assessments performed: brief mental health assessment LCSW spoke with patient on 11/11/20. Patient reports that she is stable. She shares that she keeps the telephone by hear 24/7 in case of an emergency. She shares that she has large family that consist of 4 children, 4 grandchildren and 6 great grandchildren and with their assistance she is able to take appropriate care of herself. Patient is very adamant about not wanting long term placement. She shares that her daughter is now residing with her since she lost her last aide. Patient reports that she is able to stay home now with her daughter's help. Patient reports that recent medication adjustment by PCP has been helpful to her chronic pain. Patient shares that she has been experiencing ongoing tremors (especially in her left hand) and PCP referred her to a neurologist. Dr. Malvin Johns had her start Sinemet 25/100 1 pill 3 times a day for this concern and patient reports that she has already seen a difference. She is hopeful that this medication will continue to be effective. Patient continues to see Dr.  Malvin Johns.  Patient has a strong family support network which includes her daughter who recently moved in with her, a son that resides with her but works often and another son that lives across from her. They all provide ongoing care giving and supervision but patient continues to have level of care concerns as they are only able to do so much and patient is not agreeable to placement.  Patient shares that her granddaughter visits her often for socialization with her young great grandchildren.  Family have made great efforts to accommodate patient and her safety needs within her home. They completed some home repairs in her bedroom to improve patient's living condition. Family reported in the past that they eventually want to build a walk in shower for patient as well.  Patient shares that she often sits in her lift chair and watches television. Health self-care education provided to encourage patient to increase physical activity.  Patient interviewed and appropriate assessments performed Provided mental health counseling with regard to anxiety, depression, grief support. Patient lost her spouse of 50 years and brother. Patient denies wanting any mental health involvement or grief support. LCSW provided patient with information about available grief support resources within the area ([facility] and [facility]). LCSW sent an email in the past to family with this information in case she wishes to utilize these resources. Patient worked with Tax adviser at [facility] in the past but discontinued services after 3 telephonic visits because family reported that patient was not willing to talk or open up with therapist. Patient declines needing mental health or grief support on 09/11/20.  Discussed plans with patient for ongoing care management follow up and provided patient with direct contact information for care management team Collaborated with primary care provider re: medication concerns Assisted  patient/caregiver with obtaining information about health plan benefits Provided education and assistance to client regarding Advanced Directives. Provided education to patient/caregiver regarding level of care options. Provided education to patient/caregiver about Hospice and/or Palliative Care services Patient declines any recent falls.  Patient's daughter reported having to start therapy due to ongoing stress and caregiver burnout. Patient reports ongoing chronic pain with her fibromyalgia diagnosis. Pain medication and self-care education provided.     Patient Self Care Activities:  Motivation for treatment Strong family or social support  Patient Coping Strengths:  Supportive Relationships Family Hopefulness  Patient Self Care Deficits:  Lacks social connections Unable to perform ADLs independently Unable to perform IADLs independently  Please see past updates related to this goal by clicking on the "Past Updates" button in the selected goal           Patient verbalizes understanding of instructions provided today and agrees to view in [place].   No further follow up required: Patient has upcoming appt with PCP scheduled for 02/21/21  Jenel Lucks, MSW, LCSW [facility] Management [place]  [facility] [email or web address] Phone [phone] 9:42 AM

## 2021-02-21 ENCOUNTER — Encounter: Payer: Self-pay | Admitting: Internal Medicine

## 2021-02-21 ENCOUNTER — Telehealth (INDEPENDENT_AMBULATORY_CARE_PROVIDER_SITE_OTHER): Payer: Medicare HMO | Admitting: Internal Medicine

## 2021-02-21 DIAGNOSIS — G20 Parkinson's disease: Secondary | ICD-10-CM | POA: Diagnosis not present

## 2021-02-21 DIAGNOSIS — E1140 Type 2 diabetes mellitus with diabetic neuropathy, unspecified: Secondary | ICD-10-CM | POA: Diagnosis not present

## 2021-02-21 DIAGNOSIS — Z4801 Encounter for change or removal of surgical wound dressing: Secondary | ICD-10-CM | POA: Diagnosis not present

## 2021-02-21 DIAGNOSIS — K5720 Diverticulitis of large intestine with perforation and abscess without bleeding: Secondary | ICD-10-CM | POA: Diagnosis not present

## 2021-02-21 DIAGNOSIS — Z48815 Encounter for surgical aftercare following surgery on the digestive system: Secondary | ICD-10-CM | POA: Diagnosis not present

## 2021-02-21 DIAGNOSIS — Z515 Encounter for palliative care: Secondary | ICD-10-CM

## 2021-02-21 DIAGNOSIS — B9620 Unspecified Escherichia coli [E. coli] as the cause of diseases classified elsewhere: Secondary | ICD-10-CM | POA: Diagnosis not present

## 2021-02-21 DIAGNOSIS — E1159 Type 2 diabetes mellitus with other circulatory complications: Secondary | ICD-10-CM | POA: Diagnosis not present

## 2021-02-21 DIAGNOSIS — A419 Sepsis, unspecified organism: Secondary | ICD-10-CM | POA: Diagnosis not present

## 2021-02-21 DIAGNOSIS — I10 Essential (primary) hypertension: Secondary | ICD-10-CM | POA: Diagnosis not present

## 2021-02-21 MED ORDER — LIDOCAINE 5 % EX PTCH: 1.0000 | MEDICATED_PATCH | CUTANEOUS | 2 refills | Status: DC

## 2021-02-21 NOTE — Progress Notes (Signed)
There were no vitals taken for this visit.   Subjective:    Patient ID: Cynthia Jones, female    DOB: 11-03-1940, 80 y.o.   MRN: [id number]  Chief Complaint  Patient presents with   Follow-up    Pt is on with family to discuss hospice. States this was started on Monday. Wants to know if Dr. Charlotta Newton will be the attending. Also wants to discuss sundowning    HPI: Cynthia Jones is a 80 y.o. female  2 weeks ago had AMS, weakness and her duaghter took ehr to Er had sepsis and in respiratory distress was given abx and did a guided CT and palced a drain and a diverticular abcess, she was seen by Surgeon who recommended surg, colstomy after this.  Pt declined surg. Rehab etc and wants to be comfortable. She went on hospice this last Monday    Colonic diverticular abscess Drain placed in IR 01/29/21 JP drain to a leg bag and fu with her in 3 weeks.   Hospice has given norco for pain per hospice nursing. Has had some all over pain with fibromyalgia Chief Complaint  Patient presents with   Follow-up    Pt is on with family to discuss hospice. States this was started on Monday. Wants to know if Dr. Charlotta Newton will be the attending. Also wants to discuss sundowning    Relevant past medical, surgical, family and social history reviewed and updated as indicated. Interim medical history since our last visit reviewed. Allergies and medications reviewed and updated.  Review of Systems  Per HPI unless specifically indicated above     Objective:    There were no vitals taken for this visit.  Wt Readings from Last 3 Encounters:  02/06/21 228 lb (103.4 kg)  01/28/21 231 lb 11.3 oz (105.1 kg)  01/02/21 230 lb 9.6 oz (104.6 kg)    Physical Exam Constitutional:      Appearance: Normal appearance.  Neurological:     Mental Status: She is alert.  Psychiatric:        Mood and Affect: Mood normal.        Thought Content: Thought content normal.   Unable to peform sec to virtual visit.     Results for orders placed or performed during the hospital encounter of 02/06/21  Resp Panel by RT-PCR (Flu A&B, Covid) Nasopharyngeal Swab   Specimen: Nasopharyngeal Swab; Nasopharyngeal(NP) swabs in vial transport medium  Result Value Ref Range   SARS Coronavirus 2 by RT PCR NEGATIVE NEGATIVE   Influenza A by PCR NEGATIVE NEGATIVE   Influenza B by PCR NEGATIVE NEGATIVE  Urinalysis, Complete w Microscopic  Result Value Ref Range   Color, Urine STRAW (A) YELLOW   APPearance CLEAR (A) CLEAR   Specific Gravity, Urine 1.008 1.005 - 1.030   pH 7.0 5.0 - 8.0   Glucose, UA NEGATIVE NEGATIVE mg/dL   Hgb urine dipstick NEGATIVE NEGATIVE   Bilirubin Urine NEGATIVE NEGATIVE   Ketones, ur NEGATIVE NEGATIVE mg/dL   Protein, ur NEGATIVE NEGATIVE mg/dL   Nitrite NEGATIVE NEGATIVE   Leukocytes,Ua NEGATIVE NEGATIVE   WBC, UA 0-5 0 - 5 WBC/hpf   Bacteria, UA NONE SEEN NONE SEEN   Squamous Epithelial / LPF 0-5 0 - 5  CBC with Differential  Result Value Ref Range   WBC 12.3 (H) 4.0 - 10.5 K/uL   RBC 4.20 3.87 - 5.11 MIL/uL   Hemoglobin 12.8 12.0 - 15.0 g/dL   HCT 85.2 77.8 - 24.2 %  MCV 92.4 80.0 - 100.0 fL   MCH 30.5 26.0 - 34.0 pg   MCHC 33.0 30.0 - 36.0 g/dL   RDW 05.1 10.2 - 11.1 %   Platelets 394 150 - 400 K/uL   nRBC 0.0 0.0 - 0.2 %   Neutrophils Relative % 66 %   Neutro Abs 8.2 (H) 1.7 - 7.7 K/uL   Lymphocytes Relative 20 %   Lymphs Abs 2.5 0.7 - 4.0 K/uL   Monocytes Relative 8 %   Monocytes Absolute 1.0 0.1 - 1.0 K/uL   Eosinophils Relative 3 %   Eosinophils Absolute 0.4 0.0 - 0.5 K/uL   Basophils Relative 1 %   Basophils Absolute 0.1 0.0 - 0.1 K/uL   Immature Granulocytes 2 %   Abs Immature Granulocytes 0.19 (H) 0.00 - 0.07 K/uL  Comprehensive metabolic panel  Result Value Ref Range   Sodium 135 135 - 145 mmol/L   Potassium 5.3 (H) 3.5 - 5.1 mmol/L   Chloride 102 98 - 111 mmol/L   CO2 22 22 - 32 mmol/L   Glucose, Bld 128 (H) 70 - 99 mg/dL   BUN 11 8 - 23 mg/dL    Creatinine, Ser 7.35 0.44 - 1.00 mg/dL   Calcium 9.2 8.9 - 67.0 mg/dL   Total Protein 7.9 6.5 - 8.1 g/dL   Albumin 3.2 (L) 3.5 - 5.0 g/dL   AST 24 15 - 41 U/L   ALT 8 0 - 44 U/L   Alkaline Phosphatase 47 38 - 126 U/L   Total Bilirubin 0.8 0.3 - 1.2 mg/dL   GFR, Estimated 59 (L) >60 mL/min   Anion gap 11 5 - 15  Lactic acid, plasma  Result Value Ref Range   Lactic Acid, Venous 1.6 0.5 - 1.9 mmol/L  Troponin I (High Sensitivity)  Result Value Ref Range   Troponin I (High Sensitivity) 6 <18 ng/L        Current Outpatient Medications:    carbidopa-levodopa (SINEMET IR) 25-100 MG tablet, Take 1 tablet by mouth 4 (four) times daily., Disp: , Rfl:    carvedilol (COREG) 12.5 MG tablet, Take 1 tablet by mouth daily., Disp: , Rfl:    diclofenac Sodium (VOLTAREN) 1 % GEL, Apply 2 g topically as needed. , Disp: , Rfl:    levothyroxine (SYNTHROID) 50 MCG tablet, TAKE 1 TABLET BY MOUTH EVERY DAY, Disp: 90 tablet, Rfl: 1   lidocaine (LIDODERM) 5 %, PLACE 1 PATCH ONTO THE SKIN DAILY. REMOVE & DISCARD PATCH WITHIN 12 HOURS OR AS DIRECTED BY MD, Disp: 30 patch, Rfl: 2   losartan (COZAAR) 100 MG tablet, TAKE 1 TABLET BY MOUTH DAILY, Disp: 90 tablet, Rfl: 0   metFORMIN (GLUCOPHAGE) 500 MG tablet, Take 1 tablet (500 mg total) by mouth daily with breakfast., Disp: 30 tablet, Rfl: 3   mirtazapine (REMERON) 7.5 MG tablet, TAKE 1 TABLET BY MOUTH AT BEDTIME., Disp: 90 tablet, Rfl: 0   nystatin (MYCOSTATIN/NYSTOP) powder, APPLY TO AFFECTED AREA 3 TIMES A DAY, Disp: 60 g, Rfl: 1   pregabalin (LYRICA) 25 MG capsule, Take 1 capsule (25 mg total) by mouth daily., Disp: 30 capsule, Rfl: 1    Assessment & Plan:  Parkinson's / dementia will need to fu with Neurology for such   2. Diverticulitis with abscess :  CT abdomen and pelvis drained by radiology on 6/22 patient was seen by surgery as an outpatient to follow-up on her drain surgery was recommended however patient declined such as above.  Drain does  have a  purulent discharge per surgery notes patient is under hospice care now and has a hospital bed for her chronic pain management as well as Norco for pain and Lidoderm patches which I have refilled. Fu and mx per gen surg   Problem List Items Addressed This Visit   None   No orders of the defined types were placed in this encounter.    Meds ordered this encounter  Medications   lidocaine (LIDODERM) 5 %    Sig: Place 1 patch onto the skin daily. Remove & Discard patch within 12 hours or as directed by MD    Dispense:  30 patch    Refill:  2     Follow up plan: No follow-ups on file.

## 2021-03-04 ENCOUNTER — Other Ambulatory Visit: Payer: Self-pay

## 2021-03-04 ENCOUNTER — Other Ambulatory Visit: Payer: Self-pay | Admitting: Internal Medicine

## 2021-03-04 DIAGNOSIS — K651 Peritoneal abscess: Secondary | ICD-10-CM

## 2021-03-04 NOTE — Telephone Encounter (Signed)
Requested medication (s) are due for refill today yes  Requested medication (s) are on the active medication list yes  Future visit scheduled yes  Last refill: 11/28/20 #30 1 RF  Notes to clinic: Request RF- non delegated Rx  Requested Prescriptions  Pending Prescriptions Disp Refills   pregabalin (LYRICA) 25 MG capsule [Pharmacy Med Name: PREGABALIN 25 MG CAPSULE] 30 capsule 1    Sig: TAKE 1 CAPSULE BY MOUTH EVERY DAY      Not Delegated - Neurology:  Anticonvulsants - Controlled Failed - 03/04/2021  8:39 AM      Failed - This refill cannot be delegated      Passed - Valid encounter within last 12 months    Recent Outpatient Visits           1 week ago Hospice care   [facility] Vigg, Avanti, MD   2 months ago Difficulty walking   [facility] Vigg, Avanti, MD   3 months ago Dysphagia, unspecified type   [facility] Vigg, Avanti, MD   4 months ago Arthritis   [facility] Vigg, Avanti, MD   5 months ago Suspected COVID-19 virus infection   [facility] [place], Megan P, DO       Future Appointments             In 1 month Vigg, Avanti, MD [facility]                 Requested Prescriptions  Pending Prescriptions Disp Refills   pregabalin (LYRICA) 25 MG capsule [Pharmacy Med Name: PREGABALIN 25 MG CAPSULE] 30 capsule 1    Sig: TAKE 1 CAPSULE BY MOUTH EVERY DAY      Not Delegated - Neurology:  Anticonvulsants - Controlled Failed - 03/04/2021  8:39 AM      Failed - This refill cannot be delegated      Passed - Valid encounter within last 12 months    Recent Outpatient Visits           1 week ago Hospice care   [facility] Vigg, Avanti, MD   2 months ago Difficulty walking   [facility] Vigg, Avanti, MD   3 months ago Dysphagia, unspecified type   [facility] Vigg, Avanti, MD   4 months ago Arthritis   [facility] Vigg, Avanti,  MD   5 months ago Suspected COVID-19 virus infection   [facility] [place], [place], DO       Future Appointments             In 1 month Vigg, Avanti, MD [facility]

## 2021-03-04 NOTE — Telephone Encounter (Signed)
Pt is scheduled 8/5

## 2021-03-07 DIAGNOSIS — R499 Unspecified voice and resonance disorder: Secondary | ICD-10-CM | POA: Diagnosis not present

## 2021-03-07 DIAGNOSIS — F39 Unspecified mood [affective] disorder: Secondary | ICD-10-CM | POA: Diagnosis not present

## 2021-03-07 DIAGNOSIS — F05 Delirium due to known physiological condition: Secondary | ICD-10-CM | POA: Diagnosis not present

## 2021-03-07 DIAGNOSIS — R262 Difficulty in walking, not elsewhere classified: Secondary | ICD-10-CM | POA: Diagnosis not present

## 2021-03-07 DIAGNOSIS — G309 Alzheimer's disease, unspecified: Secondary | ICD-10-CM | POA: Diagnosis not present

## 2021-03-07 DIAGNOSIS — F0281 Dementia in other diseases classified elsewhere with behavioral disturbance: Secondary | ICD-10-CM | POA: Diagnosis not present

## 2021-03-07 DIAGNOSIS — R251 Tremor, unspecified: Secondary | ICD-10-CM | POA: Diagnosis not present

## 2021-03-11 ENCOUNTER — Encounter: Payer: Self-pay | Admitting: Internal Medicine

## 2021-03-12 ENCOUNTER — Ambulatory Visit
Admission: RE | Admit: 2021-03-12 | Discharge: 2021-03-12 | Disposition: A | Discharge status: Home / Self Care | Payer: Medicare HMO | Source: Ambulatory Visit | Attending: Specialist | Admitting: Specialist

## 2021-03-12 ENCOUNTER — Encounter: Payer: Self-pay | Admitting: Radiology

## 2021-03-12 DIAGNOSIS — K651 Peritoneal abscess: Secondary | ICD-10-CM

## 2021-03-12 DIAGNOSIS — Z4682 Encounter for fitting and adjustment of non-vascular catheter: Secondary | ICD-10-CM | POA: Diagnosis not present

## 2021-03-12 HISTORY — PX: IR RADIOLOGIST EVAL & MGMT: IMG5224

## 2021-03-12 NOTE — Progress Notes (Addendum)
Referring Physician(s): Cintron,Ruben  Chief Complaint: The patient is seen in follow up today s/p image guided drainage of a pelvic/colonic diverticular abscess on 01/29/21  History of present illness: Ms. Ortez is a 80 year old female with history of diverticulitis with abscess formation in the central pelvis posterior and inferior to the sigmoid colon.  She underwent image guided drainage of the pelvic abscess at [facility] on 01/29/2021.  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  Her catheter was converted to gravity drainage and she was instructed to flush her drain 2-3 times a week with 3 cc sterile saline.  She returns today for follow-up drain injection.  She is currently under hospice care and has no plans to undergo surgical intervention.  Her granddaughter is accompanying her today.  Patient has a history of dementia.  Granddaughter reports that patient has had no high fevers at home, chills, headaches, chest pain, dyspnea, cough, abdominal/back pain, nausea, vomiting or bleeding.  Temperature today is 99.7.  Drain output has been about 20 cc over the past 3 weeks.  Drain fluid appears light brown/ feculent . She has completed antibiotic therapy.   Past Medical History:  Diagnosis Date   Acute anxiety 03/06/2015   Allergy    Anxiety    Degenerative joint disease of knee    Depression    Fibromyalgia    Obesity    Osteopenia    RAD (reactive airway disease)     Past Surgical History:  Procedure Laterality Date   ABDOMINAL HYSTERECTOMY     APPENDECTOMY     BREAST CYST ASPIRATION Right 04/18/2013   neg   CHOLECYSTECTOMY     EYE SURGERY     IR RADIOLOGIST EVAL & MGMT  02/19/2021   JOINT REPLACEMENT  2014   right knee   PAROTIDECTOMY Left    TONSILLECTOMY      Allergies: Nsaids and Erythromycin  Medications: Prior to Admission medications   Medication Sig Start  Date End Date Taking? Authorizing Provider  carbidopa-levodopa (SINEMET IR) 25-100 MG tablet Take 1 tablet by mouth 4 (four) times daily. 07/04/20   [provider]  carvedilol (COREG) 12.5 MG tablet Take 1 tablet by mouth daily. 02/10/21   [provider]  diclofenac Sodium (VOLTAREN) 1 % GEL Apply 2 g topically as needed.  01/14/20   [provider]  levothyroxine (SYNTHROID) 50 MCG tablet TAKE 1 TABLET BY MOUTH EVERY DAY 09/13/20   Cannady, Jolene T, NP  lidocaine (LIDODERM) 5 % Place 1 patch onto the skin daily. Remove & Discard patch within 12 hours or as directed by MD 02/21/21   Loura Pardon, MD  losartan (COZAAR) 100 MG tablet TAKE 1 TABLET BY MOUTH DAILY 11/23/20   Vigg, Avanti, MD  metFORMIN (GLUCOPHAGE) 500 MG tablet Take 1 tablet (500 mg total) by mouth daily with breakfast. 01/02/21   Vigg, Avanti, MD  mirtazapine (REMERON) 7.5 MG tablet TAKE 1 TABLET BY MOUTH AT BEDTIME. 01/26/21   Vigg, Avanti, MD  nystatin (MYCOSTATIN/NYSTOP) powder APPLY TO AFFECTED AREA 3 TIMES A DAY 12/24/20   Vigg, Avanti, MD  pregabalin (LYRICA) 25 MG capsule TAKE 1 CAPSULE BY MOUTH EVERY DAY 03/04/21   Loura Pardon, MD     Family History  Problem Relation Age of Onset   Heart disease Mother    Stroke Mother    Diabetes Mother    Alzheimer's disease Father  Parkinson's disease Father    Cancer Sister     Social History   Socioeconomic History   Marital status: Widowed    Spouse name: Not on file   Number of children: Not on file   Years of education: Not on file   Highest education level: 10th grade  Occupational History   Not on file  Tobacco Use   Smoking status: Never   Smokeless tobacco: Never  Vaping Use   Vaping Use: Never used  Substance and Sexual Activity   Alcohol use: No    Alcohol/week: 0.0 standard drinks   Drug use: No   Sexual activity: Not on file  Other Topics Concern   Not on file  Social History Narrative   Not on file   Social Determinants of  Health   Financial Resource Strain: Low Risk    Difficulty of Paying Living Expenses: Not hard at all  Food Insecurity: No Food Insecurity   Worried About Programme researcher, broadcasting/film/video in the Last Year: Never true   Ran Out of Food in the Last Year: Never true  Transportation Needs: No Transportation Needs   Lack of Transportation (Medical): No   Lack of Transportation (Non-Medical): No  Physical Activity: Insufficiently Active   Days of Exercise per Week: 7 days   Minutes of Exercise per Session: 20 min  Stress: No Stress Concern Present   Feeling of Stress : Not at all  Social Connections: Not on file     Vital Signs: Vitals:   03/12/21 1300  BP: 132/71  Pulse: 86  Temp: 99.7 F (37.6 C)  SpO2: 97%      Physical Exam patient awake, talkative, answers simple questions okay.  Right lower quadrant abdominal drain intact, insertion site with some minimal erythema, not significantly tender to palpation, approximately 20 cc of brown feculent fluid in gravity bag.  Imaging: No results found.  Labs:  CBC: Recent Labs    01/31/21 0419 02/01/21 0441 02/02/21 0606 02/06/21 1010  WBC 12.1* 11.8* 11.6* 12.3*  HGB 11.2* 11.5* 11.8* 12.8  HCT 33.5* 34.5* 36.0 38.8  PLT 274 321 369 394    COAGS: Recent Labs    01/27/21 2148  INR 1.1  APTT 45*    BMP: Recent Labs    01/31/21 0419 02/01/21 0441 02/02/21 0606 02/06/21 1010  NA 137 138 137 135  K 3.6 3.8 4.0 5.3*  CL 106 104 102 102  CO2 25 27 26 22   GLUCOSE 107* 128* 114* 128*  BUN 7* 10 10 11   CALCIUM 8.1* 8.5* 8.6* 9.2  CREATININE 0.64 0.71 0.82 0.98  GFRNONAA >60 >60 >60 59*    LIVER FUNCTION TESTS: Recent Labs    01/27/21 2148 01/28/21 0431 02/06/21 1010  BILITOT 1.0 1.1 0.8  AST 20 17 24   ALT 6 10 8   ALKPHOS 42 38 47  PROT 7.6 6.5 7.9  ALBUMIN 3.4* 3.0* 3.2*    Assessment: Ms. Olarte is a 80 year old female with history of diverticulitis with abscess formation in the central pelvis posterior  and inferior to the sigmoid colon.  She underwent image guided drainage of the pelvic abscess at [facility] on 01/29/2021.  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  Her catheter was converted to gravity drainage and she was instructed to flush her drain 2-3 times a week with 3 cc sterile saline.  She returns today for follow-up drain  injection.  She is currently under hospice care and has no plans to undergo surgical intervention.  Her granddaughter is accompanying her today.  Patient has a history of dementia.  Granddaughter reports that patient has had no high fevers at home, chills, headaches, chest pain, dyspnea, cough, abdominal/back pain, nausea, vomiting or bleeding.  Temperature today is 99.7.  Drain output has been about 20 cc over the past 3 weeks.  Drain fluid appears light brown/ feculent . She has completed antibiotic therapy.  Drain injection today reveals no fistula to bowel.  Contrast now refluxes out drain insertion site when injected.  Imaging studies were reviewed by Dr. Loreta Ave.  Decision made to remove right lower quadrant abdominal /pelvic drain.  Right lower quadrant drain removed in its entirety without immediate complications.  Gauze dressing applied over site.  Site care instructions reviewed with patient's granddaughter.   Signed: D. Jeananne Rama, PA-C 03/12/2021, 1:43 PM   Please refer to Dr. Kenna Gilbert attestation of this note for management and plan.      Patient ID: LAIANA BAIERL, female   DOB: 1940/10/20, 80 y.o.   MRN: [id number]

## 2021-03-19 ENCOUNTER — Telehealth: Payer: Self-pay

## 2021-03-27 ENCOUNTER — Other Ambulatory Visit: Payer: Medicare HMO | Admitting: Nurse Practitioner

## 2021-03-27 ENCOUNTER — Other Ambulatory Visit: Payer: Self-pay | Admitting: Internal Medicine

## 2021-03-27 DIAGNOSIS — I10 Essential (primary) hypertension: Secondary | ICD-10-CM

## 2021-03-27 NOTE — Telephone Encounter (Signed)
Requested Prescriptions  Pending Prescriptions Disp Refills  . losartan (COZAAR) 100 MG tablet [Pharmacy Med Name: LOSARTAN POTASSIUM 100 MG TAB] 90 tablet 0    Sig: TAKE 1 TABLET BY MOUTH EVERY DAY     Cardiovascular:  Angiotensin Receptor Blockers Failed - 03/27/2021  3:41 AM      Failed - K in normal range and within 180 days    Potassium  Date Value Ref Range Status  02/06/2021 5.3 (H) 3.5 - 5.1 mmol/L Final         Passed - Cr in normal range and within 180 days    Creatinine, Ser  Date Value Ref Range Status  02/06/2021 0.98 0.44 - 1.00 mg/dL Final         Passed - Patient is not pregnant      Passed - Last BP in normal range    BP Readings from Last 1 Encounters:  03/12/21 132/71         Passed - Valid encounter within last 6 months    Recent Outpatient Visits          1 month ago Hospice care   [facility] Vigg, Avanti, MD   2 months ago Difficulty walking   [facility] Vigg, Avanti, MD   4 months ago Dysphagia, unspecified type   [facility] Vigg, Avanti, MD   4 months ago Arthritis   [facility] Vigg, Avanti, MD   6 months ago Suspected COVID-19 virus infection   [facility] Colwell, Megan P, DO      Future Appointments            In 1 week Vigg, Avanti, MD [facility]

## 2021-03-28 ENCOUNTER — Other Ambulatory Visit: Payer: Medicare HMO

## 2021-04-01 ENCOUNTER — Other Ambulatory Visit: Payer: Self-pay | Admitting: Internal Medicine

## 2021-04-03 ENCOUNTER — Other Ambulatory Visit: Payer: Self-pay | Admitting: Family Medicine

## 2021-04-04 ENCOUNTER — Ambulatory Visit: Payer: Medicare HMO | Admitting: Internal Medicine

## 2021-04-07 ENCOUNTER — Other Ambulatory Visit: Payer: Self-pay | Admitting: Nurse Practitioner

## 2021-04-07 NOTE — Telephone Encounter (Signed)
   Last refill:  01/13/2021  Future visit scheduled: yes  Notes to clinic:  Patient has appointment tomorrow  Failed protocol:TSH needs to be rechecked within 3 months after an abnormal result. Refill until TSH is due    Requested Prescriptions  Pending Prescriptions Disp Refills   levothyroxine (SYNTHROID) 50 MCG tablet [Pharmacy Med Name: LEVOTHYROXINE 50 MCG TABLET] 90 tablet 1    Sig: TAKE 1 TABLET BY MOUTH EVERY DAY      Endocrinology:  Hypothyroid Agents Failed - 04/07/2021  9:31 AM      Failed - TSH needs to be rechecked within 3 months after an abnormal result. Refill until TSH is due.      Passed - TSH in normal range and within 360 days    TSH  Date Value Ref Range Status  11/14/2020 3.480 0.450 - 4.500 uIU/mL Final          Passed - Valid encounter within last 12 months    Recent Outpatient Visits           1 month ago Hospice care   [facility] Vigg, Avanti, MD   3 months ago Difficulty walking   [facility] Vigg, Avanti, MD   4 months ago Dysphagia, unspecified type   [facility] Vigg, Avanti, MD   5 months ago Arthritis   [facility] Vigg, Avanti, MD   6 months ago Suspected COVID-19 virus infection   [facility] [place], Buckner, DO       Future Appointments             Tomorrow Vigg, Avanti, MD [facility], [facility]

## 2021-04-08 ENCOUNTER — Encounter: Payer: Self-pay | Admitting: Family Medicine

## 2021-04-08 ENCOUNTER — Telehealth (INDEPENDENT_AMBULATORY_CARE_PROVIDER_SITE_OTHER): Payer: Medicare Other | Admitting: Family Medicine

## 2021-04-08 ENCOUNTER — Ambulatory Visit: Payer: Medicare HMO | Admitting: Internal Medicine

## 2021-04-08 VITALS — BP 130/82

## 2021-04-08 DIAGNOSIS — I152 Hypertension secondary to endocrine disorders: Secondary | ICD-10-CM

## 2021-04-08 DIAGNOSIS — Z515 Encounter for palliative care: Secondary | ICD-10-CM | POA: Diagnosis not present

## 2021-04-08 DIAGNOSIS — Z7401 Bed confinement status: Secondary | ICD-10-CM | POA: Insufficient documentation

## 2021-04-08 DIAGNOSIS — E1140 Type 2 diabetes mellitus with diabetic neuropathy, unspecified: Secondary | ICD-10-CM | POA: Diagnosis not present

## 2021-04-08 DIAGNOSIS — M79672 Pain in left foot: Secondary | ICD-10-CM | POA: Diagnosis not present

## 2021-04-08 DIAGNOSIS — I10 Essential (primary) hypertension: Secondary | ICD-10-CM | POA: Diagnosis not present

## 2021-04-08 DIAGNOSIS — E1159 Type 2 diabetes mellitus with other circulatory complications: Secondary | ICD-10-CM

## 2021-04-08 DIAGNOSIS — E039 Hypothyroidism, unspecified: Secondary | ICD-10-CM

## 2021-04-08 MED ORDER — PREGABALIN 25 MG PO CAPS: 25.0000 mg | ORAL_CAPSULE | Freq: Two times a day (BID) | ORAL | 1 refills | Status: DC

## 2021-04-08 MED ORDER — MIRTAZAPINE 15 MG PO TABS: 15.0000 mg | ORAL_TABLET | Freq: Every day | ORAL | 0 refills | Status: DC

## 2021-04-08 MED ORDER — LOSARTAN POTASSIUM 100 MG PO TABS: 100.0000 mg | ORAL_TABLET | Freq: Every day | ORAL | 1 refills | Status: DC

## 2021-04-08 MED ORDER — CARVEDILOL 12.5 MG PO TABS: 12.5000 mg | ORAL_TABLET | Freq: Two times a day (BID) | ORAL | 1 refills | Status: DC

## 2021-04-08 NOTE — Progress Notes (Signed)
BP 130/82    Subjective:    Patient ID: Cynthia Jones, female    DOB: February 19, 1941, 80 y.o.   MRN: [id number]  HPI: Cynthia Jones is a 80 y.o. female  Chief Complaint  Patient presents with   Diabetes   Hypertension   HYPERTENSION Hypertension status: stable  Satisfied with current treatment? yes Duration of hypertension: chronic BP monitoring frequency:  a few times a week BP range:  BP medication side effects:  no Medication compliance: excellent compliance Previous BP meds:losartan Aspirin: no Recurrent headaches: no Visual changes: no Palpitations: no Dyspnea: no Chest pain: no Lower extremity edema: no Dizzy/lightheaded: no  DIABETES Hypoglycemic episodes:no Polydipsia/polyuria: yes Visual disturbance: no Chest pain: no Paresthesias: no Glucose Monitoring: no  Accucheck frequency: Not Checking Taking Insulin?: no Blood Pressure Monitoring: a few times a week Retinal Examination: Not up to Date Foot Exam: Not up to Date Diabetic Education: Completed Pneumovax: Up to Date Influenza: Up to Date Aspirin: no  Relevant past medical, surgical, family and social history reviewed and updated as indicated. Interim medical history since our last visit reviewed. Allergies and medications reviewed and updated.  Review of Systems  Constitutional:  Positive for fatigue. Negative for activity change, appetite change, chills, diaphoresis, fever and unexpected weight change.  Respiratory: Negative.    Cardiovascular: Negative.   Gastrointestinal: Negative.   Musculoskeletal:  Positive for arthralgias. Negative for back pain, gait problem, joint swelling, myalgias, neck pain and neck stiffness.       L foot pain for 2 days  Skin: Negative.   Neurological:  Positive for weakness. Negative for dizziness, tremors, seizures, syncope, facial asymmetry, speech difficulty, light-headedness, numbness and headaches.  Psychiatric/Behavioral: Negative.     Per HPI  unless specifically indicated above     Objective:    BP 130/82   Wt Readings from Last 3 Encounters:  02/06/21 228 lb (103.4 kg)  01/28/21 231 lb 11.3 oz (105.1 kg)  01/02/21 230 lb 9.6 oz (104.6 kg)    Physical Exam Vitals and nursing note reviewed.  Constitutional:      General: She is not in acute distress.    Appearance: Normal appearance. She is not ill-appearing, toxic-appearing or diaphoretic.  HENT:     Head: Normocephalic and atraumatic.     Right Ear: External ear normal.     Left Ear: External ear normal.     Nose: Nose normal.     Mouth/Throat:     Mouth: Mucous membranes are moist.     Pharynx: Oropharynx is clear.  Eyes:     General: No scleral icterus.       Right eye: No discharge.        Left eye: No discharge.     Conjunctiva/sclera: Conjunctivae normal.     Pupils: Pupils are equal, round, and reactive to light.  Pulmonary:     Effort: Pulmonary effort is normal. No respiratory distress.     Comments: Speaking in full sentences Musculoskeletal:        General: Normal range of motion.     Cervical back: Normal range of motion.  Skin:    Coloration: Skin is not jaundiced or pale.     Findings: No bruising, erythema, lesion or rash.  Neurological:     Mental Status: She is alert and oriented to person, place, and time. Mental status is at baseline.  Psychiatric:        Mood and Affect: Mood normal.  Behavior: Behavior normal.        Thought Content: Thought content normal.        Judgment: Judgment normal.    Results for orders placed or performed during the hospital encounter of 02/06/21  Resp Panel by RT-PCR (Flu A&B, Covid) Nasopharyngeal Swab   Specimen: Nasopharyngeal Swab; Nasopharyngeal(NP) swabs in vial transport medium  Result Value Ref Range   SARS Coronavirus 2 by RT PCR NEGATIVE NEGATIVE   Influenza A by PCR NEGATIVE NEGATIVE   Influenza B by PCR NEGATIVE NEGATIVE  Urinalysis, Complete w Microscopic  Result Value Ref Range    Color, Urine STRAW (A) YELLOW   APPearance CLEAR (A) CLEAR   Specific Gravity, Urine 1.008 1.005 - 1.030   pH 7.0 5.0 - 8.0   Glucose, UA NEGATIVE NEGATIVE mg/dL   Hgb urine dipstick NEGATIVE NEGATIVE   Bilirubin Urine NEGATIVE NEGATIVE   Ketones, ur NEGATIVE NEGATIVE mg/dL   Protein, ur NEGATIVE NEGATIVE mg/dL   Nitrite NEGATIVE NEGATIVE   Leukocytes,Ua NEGATIVE NEGATIVE   WBC, UA 0-5 0 - 5 WBC/hpf   Bacteria, UA NONE SEEN NONE SEEN   Squamous Epithelial / LPF 0-5 0 - 5  CBC with Differential  Result Value Ref Range   WBC 12.3 (H) 4.0 - 10.5 K/uL   RBC 4.20 3.87 - 5.11 MIL/uL   Hemoglobin 12.8 12.0 - 15.0 g/dL   HCT 95.0 93.2 - 67.1 %   MCV 92.4 80.0 - 100.0 fL   MCH 30.5 26.0 - 34.0 pg   MCHC 33.0 30.0 - 36.0 g/dL   RDW 24.5 80.9 - 98.3 %   Platelets 394 150 - 400 K/uL   nRBC 0.0 0.0 - 0.2 %   Neutrophils Relative % 66 %   Neutro Abs 8.2 (H) 1.7 - 7.7 K/uL   Lymphocytes Relative 20 %   Lymphs Abs 2.5 0.7 - 4.0 K/uL   Monocytes Relative 8 %   Monocytes Absolute 1.0 0.1 - 1.0 K/uL   Eosinophils Relative 3 %   Eosinophils Absolute 0.4 0.0 - 0.5 K/uL   Basophils Relative 1 %   Basophils Absolute 0.1 0.0 - 0.1 K/uL   Immature Granulocytes 2 %   Abs Immature Granulocytes 0.19 (H) 0.00 - 0.07 K/uL  Comprehensive metabolic panel  Result Value Ref Range   Sodium 135 135 - 145 mmol/L   Potassium 5.3 (H) 3.5 - 5.1 mmol/L   Chloride 102 98 - 111 mmol/L   CO2 22 22 - 32 mmol/L   Glucose, Bld 128 (H) 70 - 99 mg/dL   BUN 11 8 - 23 mg/dL   Creatinine, Ser 3.82 0.44 - 1.00 mg/dL   Calcium 9.2 8.9 - 50.5 mg/dL   Total Protein 7.9 6.5 - 8.1 g/dL   Albumin 3.2 (L) 3.5 - 5.0 g/dL   AST 24 15 - 41 U/L   ALT 8 0 - 44 U/L   Alkaline Phosphatase 47 38 - 126 U/L   Total Bilirubin 0.8 0.3 - 1.2 mg/dL   GFR, Estimated 59 (L) >60 mL/min   Anion gap 11 5 - 15  Lactic acid, plasma  Result Value Ref Range   Lactic Acid, Venous 1.6 0.5 - 1.9 mmol/L  Troponin I (High Sensitivity)  Result  Value Ref Range   Troponin I (High Sensitivity) 6 <18 ng/L      Assessment & Plan:   Problem List Items Addressed This Visit       Endocrine   Diabetes mellitus (HCC)  Metformin added previously due to A1c of 7. Given hospice. OK being a little more loose with goal of less than 8-9. We will stop metformin. Await recheck on labs.       Relevant Medications   losartan (COZAAR) 100 MG tablet   Other Relevant Orders   VITAMIN D 25 Hydroxy (Vit-D Deficiency, Fractures)   Comprehensive metabolic panel   CBC with Differential/Platelet   B12   Hgb A1c w/o eAG   Hypothyroid    Will check labs with hospice. Await results.       Relevant Medications   carvedilol (COREG) 12.5 MG tablet   Other Relevant Orders   TSH     Other   Bedbound    Will get hospice in to check her labs. Continue to monitor. Continue to minimize medication.       Hospice care - Primary    Will cut down on medication. Patient is on hospice. Goal of making her comfortable. OK with an A1c <8-9.       Other Visit Diagnoses     Left foot pain       Will check labs including uric acid to make sure there is no gout- will increase her lyrica to help with pain.    Relevant Orders   VITAMIN D 25 Hydroxy (Vit-D Deficiency, Fractures)   Uric acid   Essential hypertension       Relevant Medications   losartan (COZAAR) 100 MG tablet   carvedilol (COREG) 12.5 MG tablet   Other Relevant Orders   Comprehensive metabolic panel   CBC with Differential/Platelet        Follow up plan: No follow-ups on file.    This visit was completed via video visit through MyChart due to the restrictions of the COVID-19 pandemic. All issues as above were discussed and addressed. Physical exam was done as above through visual confirmation on video through MyChart. If it was felt that the patient should be evaluated in the office, they were directed there. The patient verbally consented to this visit. Location of the patient:  home Location of the provider: work Those involved with this call:  Provider: Olevia Perches, DO CMA: Wilhemena Durie, CMA Front Desk/Registration: Sherlyn Lick  Time spent on call:  25 minutes with patient face to face via video conference. More than 50% of this time was spent in counseling and coordination of care. 40 minutes total spent in review of patient's record and preparation of their chart.

## 2021-04-09 ENCOUNTER — Telehealth: Payer: Self-pay | Admitting: Internal Medicine

## 2021-04-09 NOTE — Telephone Encounter (Signed)
Sure that's fine, please see how we can help thnx.

## 2021-04-09 NOTE — Telephone Encounter (Signed)
Cynthia Jones, from [facility], calling stating that the pts daughter is wanting to have the pts lab work done for Uric Acid at home. She states that they are needing to have orders placed. Please advise.     [phone]

## 2021-04-10 NOTE — Telephone Encounter (Signed)
Hospice made aware it is ok for them to draw labs needed for patient, Misty from Hospice verbalized understanding.

## 2021-04-13 NOTE — Assessment & Plan Note (Signed)
Metformin added previously due to A1c of 7. Given hospice. OK being a little more loose with goal of less than 8-9. We will stop metformin. Await recheck on labs.

## 2021-04-13 NOTE — Assessment & Plan Note (Signed)
Will cut down on medication. Patient is on hospice. Goal of making her comfortable. OK with an A1c <8-9.

## 2021-04-13 NOTE — Assessment & Plan Note (Signed)
Under good control on current regimen. Continue current regimen. Continue to monitor. Call with any concerns. Refills given. Labs to be drawn.  

## 2021-04-13 NOTE — Assessment & Plan Note (Signed)
Will check labs with hospice. Await results.

## 2021-04-13 NOTE — Assessment & Plan Note (Signed)
Will get hospice in to check her labs. Continue to monitor. Continue to minimize medication.

## 2021-04-15 ENCOUNTER — Telehealth: Payer: Self-pay | Admitting: Internal Medicine

## 2021-04-15 NOTE — Telephone Encounter (Signed)
Patient's granddaughter states that the pain in her foot is better.

## 2021-04-15 NOTE — Telephone Encounter (Signed)
We cannot know if her foot is gout without a lab draw

## 2021-04-15 NOTE — Telephone Encounter (Signed)
Routing to provider as an FYI

## 2021-04-15 NOTE — Telephone Encounter (Signed)
Dr. Laural Benes sent over lab work for the pt but Mitzi states that the daughter is declining the labs at this time/ the last lab didn't go well because pt is a hard stick/ she is a hospice pt/ Daughter doesn't want labs done unless emergent /please advise

## 2021-04-25 ENCOUNTER — Other Ambulatory Visit: Payer: Self-pay | Admitting: Internal Medicine

## 2021-05-19 ENCOUNTER — Telehealth: Payer: Self-pay | Admitting: Internal Medicine

## 2021-05-19 NOTE — Telephone Encounter (Signed)
LVM for pt to call the office to schedule AWV with [facility]. Please schedule this appt if pt calls the office.   Thanks  

## 2021-07-02 ENCOUNTER — Other Ambulatory Visit: Payer: Self-pay | Admitting: Family Medicine

## 2021-07-02 DIAGNOSIS — I10 Essential (primary) hypertension: Secondary | ICD-10-CM

## 2021-07-02 NOTE — Telephone Encounter (Signed)
Please see below and advise accordingly. 

## 2021-07-02 NOTE — Telephone Encounter (Signed)
Requested medications are due for refill today.  no  Requested medications are on the active medications list.  no  Last refill. 07/30/2020  Future visit scheduled.   no  Notes to clinic.  Coreg 6.25 was discontinued by Dr. Sunnie Nielsen on 02/02/2021.

## 2021-07-28 ENCOUNTER — Telehealth: Payer: Self-pay | Admitting: Internal Medicine

## 2021-07-28 NOTE — Telephone Encounter (Signed)
Cynthia Jones with [facility] called stating that she sent Dr. Alphonsus Sias a death certificate of a pt that was not his. Cynthia Jones states that the Case # [id number] needs to be sent back electronically.

## 2021-07-28 NOTE — Telephone Encounter (Signed)
I spoke to [place] and have relinquished the case back to her

## 2021-07-31 DEATH — deceased

## 2021-10-03 ENCOUNTER — Telehealth: Payer: Self-pay | Admitting: Internal Medicine

## 2021-10-03 NOTE — Telephone Encounter (Signed)
Copied from CRM [id number]. Topic: Medicare AWV >> Oct 03, 2021  3:22 PM Leigh Aurora wrote: Reason for CRM:  Left message for patient to call back and schedule the Medicare Annual Wellness Visit (AWV) virtually or by telephone.  Last AWV 04/19/20  Please schedule at anytime with [facility] Health Advisor.  45 minute appointment  Any questions, please call me at [phone]

## 2021-12-26 ENCOUNTER — Telehealth: Payer: Self-pay | Admitting: Internal Medicine

## 2021-12-26 NOTE — Telephone Encounter (Signed)
Copied from CRM [id number]. Topic: Medicare AWV ?>> Dec 26, 2021 12:06 PM Leigh Aurora wrote: ?Reason for CRM:  ?Left message for patient to call back and schedule the Medicare Annual Wellness Visit (AWV) virtually or by telephone. ? ?Last AWV 04/19/20 ? ?Please schedule at anytime with [facility] Health Advisor. ? ? ? ?Any questions, please call me at [phone] ?

## 2022-07-06 IMAGING — CT CT IMAGE GUIDED DRAINAGE BY PERCUTANEOUS CATHETER
1 of 6 series · 11 of 32 positions shown, 17 images · non-contrast
Comparison: none

CLINICAL DATA: Colonic diverticulitis with abscess formation in the
central pelvis posterior and inferior to the sigmoid colon.

[Series 2: i-spiral 5.0 b30f · axial · 0.98mm/px · z∈[+924,+1096]mm · 11 of 59 slices shown, 17 images]
[im 5/59  soft-tissue]
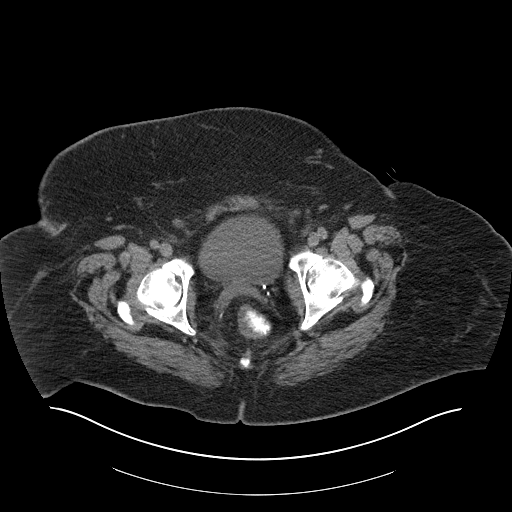
[im 5/59  bone]
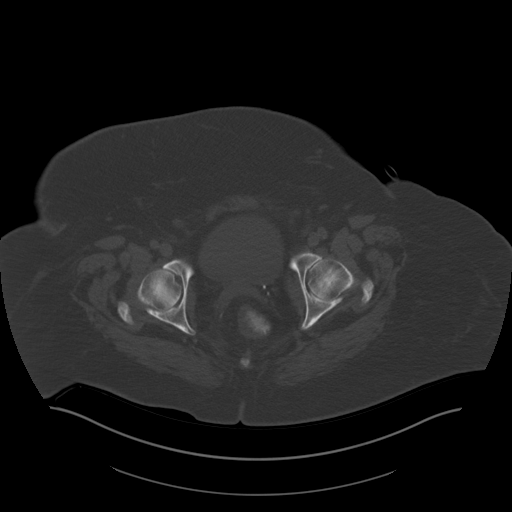
[im 10/59  soft-tissue]
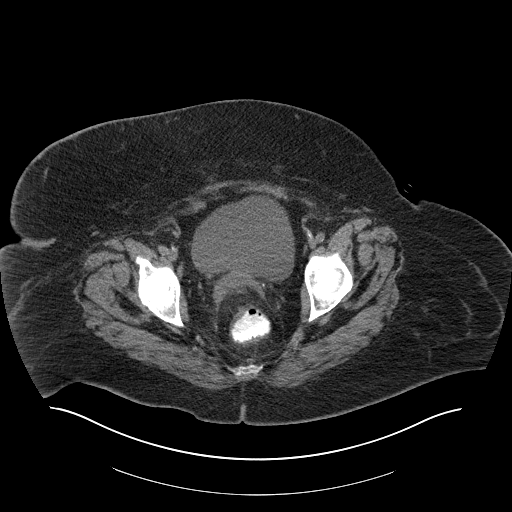
[im 15/59  soft-tissue]
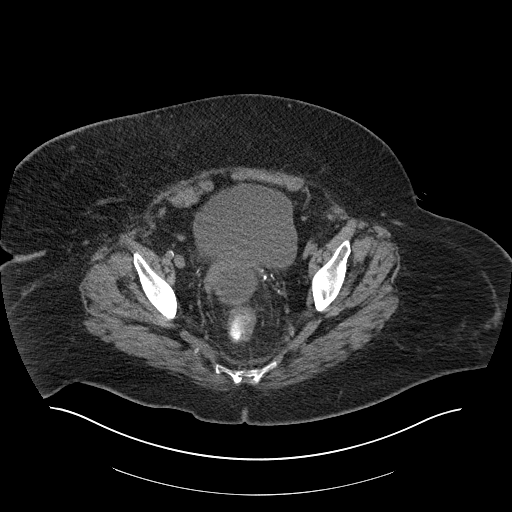
[im 20/59  soft-tissue]
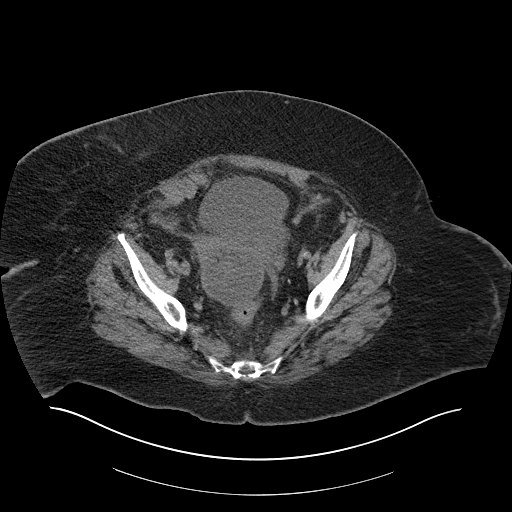
[im 25/59  soft-tissue]
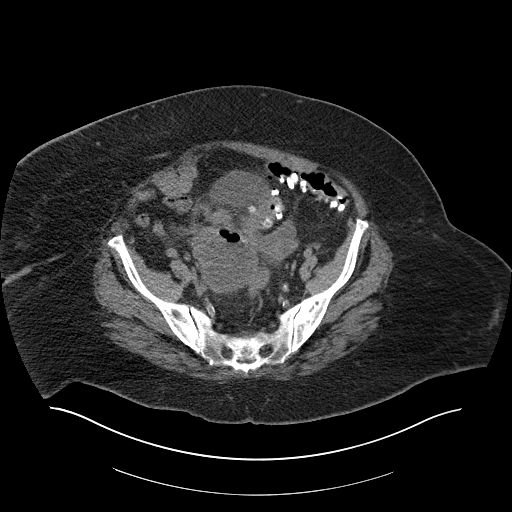
[im 30/59  soft-tissue]
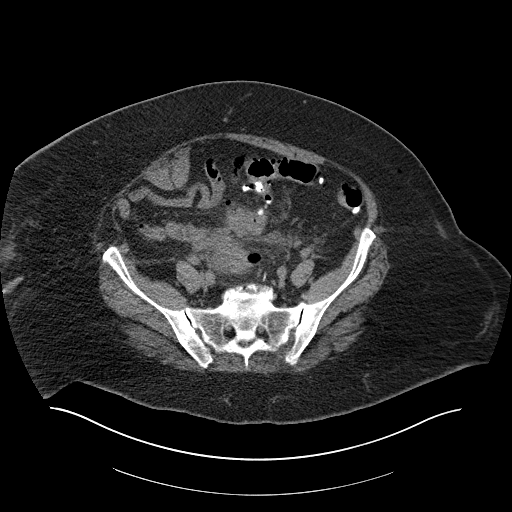
[im 34/59  soft-tissue]
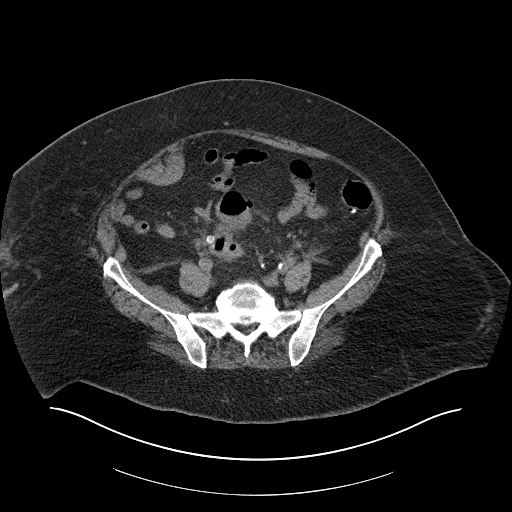
[im 39/59  soft-tissue]
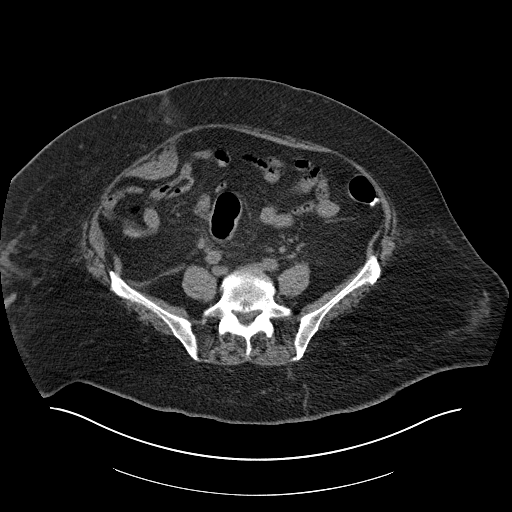
[im 39/59  lung]
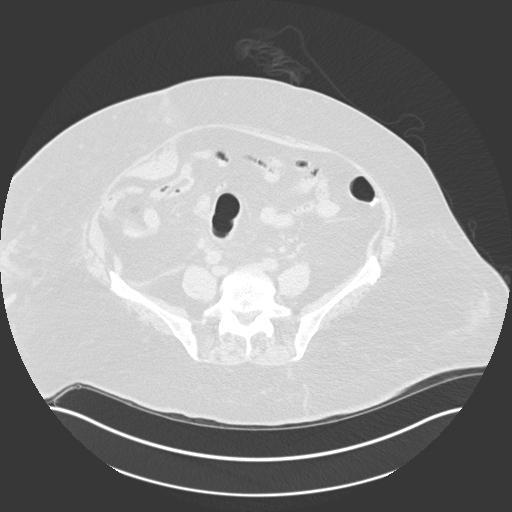
[im 44/59  soft-tissue]
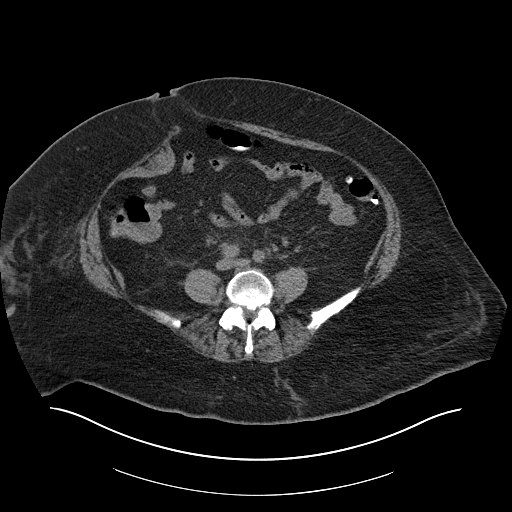
[im 44/59  lung]
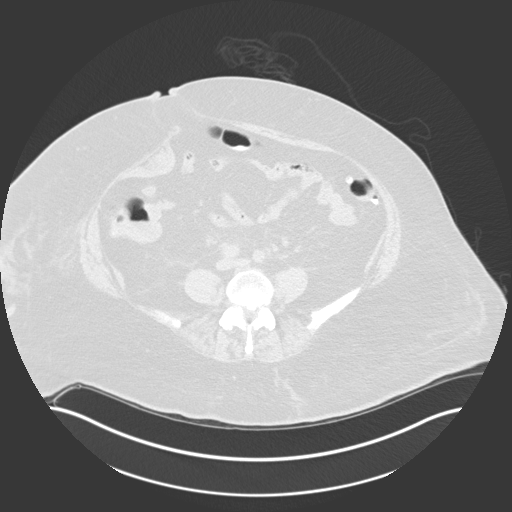
[im 44/59  bone]
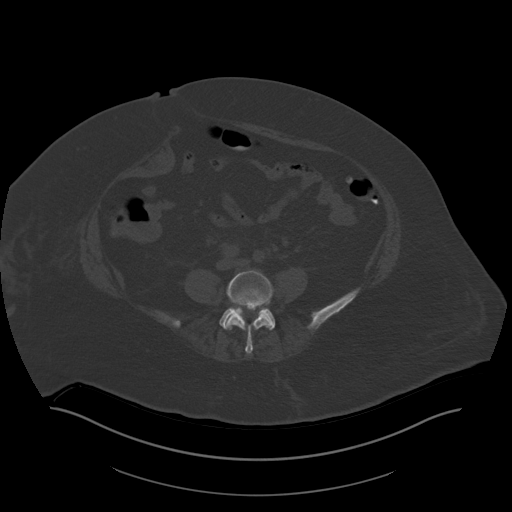
[im 49/59  soft-tissue]
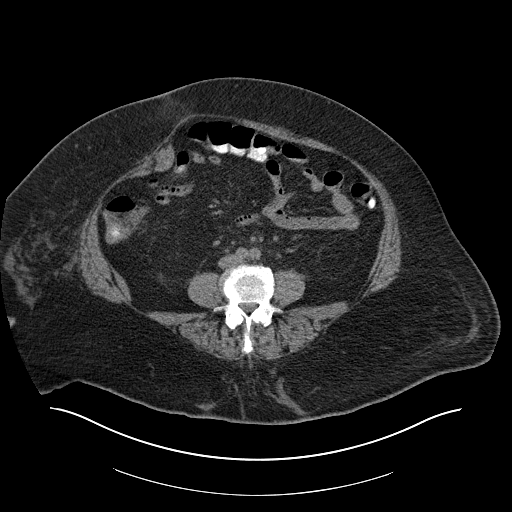
[im 49/59  lung]
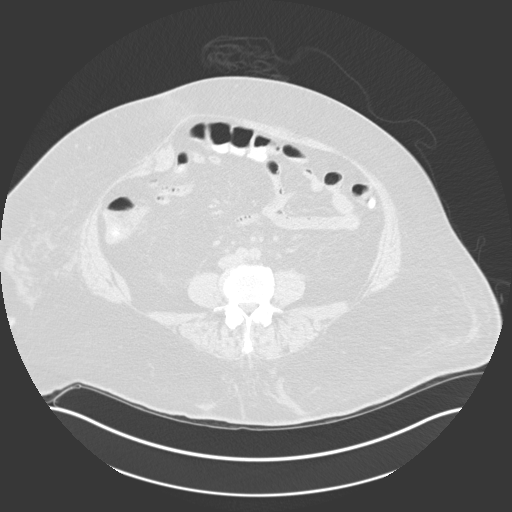
[im 54/59  soft-tissue]
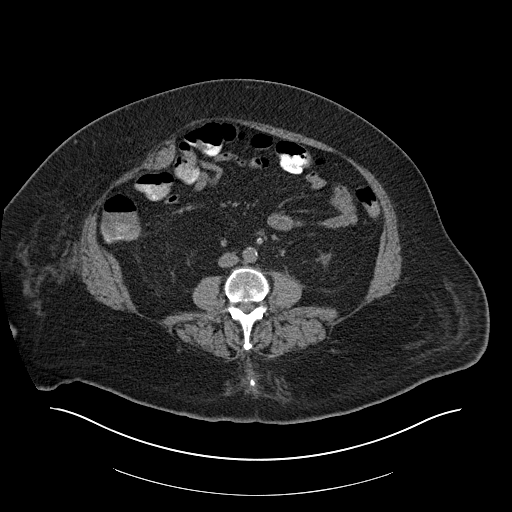
[im 54/59  lung]
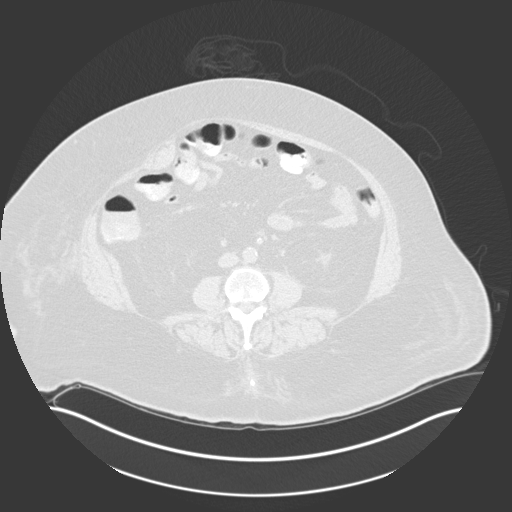

[11 of 32 positions shown; findings below may reference images not displayed]

EXAM:
CT GUIDED CATHETER DRAINAGE OF COLONIC DIVERTICULAR ABSCESS

ANESTHESIA/SEDATION:
2.0 mg IV Versed 100 mcg IV Fentanyl

Total Moderate Sedation Time:  20 minutes

The patient's level of consciousness and physiologic status were
continuously monitored during the procedure by Radiology nursing.

PROCEDURE:
The procedure, risks, benefits, and alternatives were explained to
the patient. Questions regarding the procedure were encouraged and
answered. The patient understands and consents to the procedure. A
time out was performed prior to initiating the procedure.

CT was performed through the pelvis in a supine position. The right
lower abdominal wall was prepped with chlorhexidine in a sterile
fashion, and a sterile drape was applied covering the operative
field. A sterile gown and sterile gloves were used for the
procedure. Local anesthesia was provided with 1% Lidocaine.

Under CT guidance, an 18 gauge trocar needle was advanced to the
level of a diverticular abscess in the central pelvis from a
right-sided approach. After confirming needle tip position and with
return of fluid, a guidewire was advanced and the needle removed.
The percutaneous tract was dilated and a 10 French percutaneous
drainage catheter advanced. Drain position was confirmed by CT. A
sample of fluid was sent for culture analysis. The drain was
attached to a suction bulb and secured at the skin with a Prolene
retention suture and adhesive StatLock device.

COMPLICATIONS:
None
FINDINGS: After needle access of the deep diverticular abscess, there was
return of purulent and foul-smelling fluid. After drain placement,
there was rapid return of purulent fluid. A sample was sent for
culture analysis.
IMPRESSION: CT-guided percutaneous catheter drainage of pelvic colonic
diverticular abscess. A 10 French percutaneous drain was placed
within the abscess and attached to suction bulb drainage. A sample
of purulent fluid was sent for culture analysis.

## 2023-08-19 ENCOUNTER — Encounter: Payer: Self-pay | Admitting: Nurse Practitioner
